# Patient Record
Sex: Male | Born: 1935 | Race: White | Hispanic: No | Marital: Married | Smoking: Former smoker
Health system: Southern US, Community
[De-identification: ages and names within clinical notes are randomized; demographics above are authoritative.]

## PROBLEM LIST (undated history)

## (undated) DIAGNOSIS — F329 Major depressive disorder, single episode, unspecified: Secondary | ICD-10-CM

## (undated) DIAGNOSIS — I5032 Chronic diastolic (congestive) heart failure: Secondary | ICD-10-CM

## (undated) DIAGNOSIS — I480 Paroxysmal atrial fibrillation: Secondary | ICD-10-CM

## (undated) DIAGNOSIS — M79609 Pain in unspecified limb: Secondary | ICD-10-CM

## (undated) DIAGNOSIS — R0600 Dyspnea, unspecified: Secondary | ICD-10-CM

## (undated) DIAGNOSIS — G9589 Other specified diseases of spinal cord: Secondary | ICD-10-CM

## (undated) DIAGNOSIS — Z87442 Personal history of urinary calculi: Secondary | ICD-10-CM

## (undated) DIAGNOSIS — R079 Chest pain, unspecified: Secondary | ICD-10-CM

## (undated) DIAGNOSIS — T884XXA Failed or difficult intubation, initial encounter: Secondary | ICD-10-CM

## (undated) DIAGNOSIS — M4800 Spinal stenosis, site unspecified: Secondary | ICD-10-CM

## (undated) DIAGNOSIS — S92909K Unspecified fracture of unspecified foot, subsequent encounter for fracture with nonunion: Secondary | ICD-10-CM

## (undated) DIAGNOSIS — R002 Palpitations: Secondary | ICD-10-CM

## (undated) DIAGNOSIS — M5030 Other cervical disc degeneration, unspecified cervical region: Secondary | ICD-10-CM

## (undated) DIAGNOSIS — Z86718 Personal history of other venous thrombosis and embolism: Secondary | ICD-10-CM

## (undated) DIAGNOSIS — M545 Low back pain: Secondary | ICD-10-CM

## (undated) DIAGNOSIS — I10 Essential (primary) hypertension: Secondary | ICD-10-CM

## (undated) DIAGNOSIS — F3289 Other specified depressive episodes: Secondary | ICD-10-CM

## (undated) DIAGNOSIS — E782 Mixed hyperlipidemia: Secondary | ICD-10-CM

## (undated) DIAGNOSIS — H6120 Impacted cerumen, unspecified ear: Secondary | ICD-10-CM

## (undated) DIAGNOSIS — M1990 Unspecified osteoarthritis, unspecified site: Secondary | ICD-10-CM

## (undated) DIAGNOSIS — R55 Syncope and collapse: Secondary | ICD-10-CM

## (undated) DIAGNOSIS — R1311 Dysphagia, oral phase: Secondary | ICD-10-CM

## (undated) DIAGNOSIS — K559 Vascular disorder of intestine, unspecified: Secondary | ICD-10-CM

## (undated) DIAGNOSIS — IMO0002 Reserved for concepts with insufficient information to code with codable children: Secondary | ICD-10-CM

## (undated) DIAGNOSIS — R0789 Other chest pain: Secondary | ICD-10-CM

## (undated) DIAGNOSIS — F419 Anxiety disorder, unspecified: Secondary | ICD-10-CM

## (undated) DIAGNOSIS — K8020 Calculus of gallbladder without cholecystitis without obstruction: Secondary | ICD-10-CM

## (undated) DIAGNOSIS — Z7901 Long term (current) use of anticoagulants: Secondary | ICD-10-CM

## (undated) DIAGNOSIS — C44529 Squamous cell carcinoma of skin of other part of trunk: Secondary | ICD-10-CM

## (undated) DIAGNOSIS — J189 Pneumonia, unspecified organism: Secondary | ICD-10-CM

## (undated) DIAGNOSIS — R609 Edema, unspecified: Secondary | ICD-10-CM

## (undated) DIAGNOSIS — R0902 Hypoxemia: Secondary | ICD-10-CM

## (undated) HISTORY — DX: Spinal stenosis, site unspecified: M48.00

## (undated) HISTORY — DX: Long term (current) use of anticoagulants: Z79.01

## (undated) HISTORY — DX: Mixed hyperlipidemia: E78.2

## (undated) HISTORY — DX: Other cervical disc degeneration, unspecified cervical region: M50.30

## (undated) HISTORY — DX: Personal history of other venous thrombosis and embolism: Z86.718

## (undated) HISTORY — DX: Major depressive disorder, single episode, unspecified: F32.9

## (undated) HISTORY — PX: CATARACT EXTRACTION W/ INTRAOCULAR LENS  IMPLANT, BILATERAL: SHX1307

## (undated) HISTORY — DX: Anxiety disorder, unspecified: F41.9

## (undated) HISTORY — DX: Impacted cerumen, unspecified ear: H61.20

## (undated) HISTORY — DX: Chronic diastolic (congestive) heart failure: I50.32

## (undated) HISTORY — DX: Low back pain: M54.5

## (undated) HISTORY — DX: Dysphagia, oral phase: R13.11

## (undated) HISTORY — DX: Vascular disorder of intestine, unspecified: K55.9

## (undated) HISTORY — DX: Hypoxemia: R09.02

## (undated) HISTORY — DX: Syncope and collapse: R55

## (undated) HISTORY — DX: Edema, unspecified: R60.9

## (undated) HISTORY — DX: Pneumonia, unspecified organism: J18.9

## (undated) HISTORY — DX: Squamous cell carcinoma of skin of other part of trunk: C44.529

## (undated) HISTORY — DX: Other specified depressive episodes: F32.89

## (undated) HISTORY — DX: Palpitations: R00.2

## (undated) HISTORY — DX: Chest pain, unspecified: R07.9

## (undated) HISTORY — DX: Other chest pain: R07.89

## (undated) HISTORY — DX: Pain in unspecified limb: M79.609

## (undated) HISTORY — PX: MULTIPLE TOOTH EXTRACTIONS: SHX2053

## (undated) HISTORY — DX: Other specified diseases of spinal cord: G95.89

## (undated) HISTORY — DX: Paroxysmal atrial fibrillation: I48.0

## (undated) HISTORY — DX: Reserved for concepts with insufficient information to code with codable children: IMO0002

## (undated) HISTORY — DX: Unspecified osteoarthritis, unspecified site: M19.90

---

## 1996-12-02 ENCOUNTER — Encounter: Payer: Self-pay | Admitting: Family Medicine

## 1998-11-11 ENCOUNTER — Encounter: Payer: Self-pay | Admitting: Family Medicine

## 2000-10-12 ENCOUNTER — Encounter: Payer: Self-pay | Admitting: Family Medicine

## 2000-10-12 HISTORY — PX: OTHER SURGICAL HISTORY: SHX169

## 2000-10-12 LAB — CONVERTED CEMR LAB: PSA: 0.6 ng/mL

## 2000-11-01 ENCOUNTER — Encounter: Admission: RE | Admit: 2000-11-01 | Discharge: 2000-11-01 | Payer: Self-pay | Admitting: Family Medicine

## 2000-11-01 ENCOUNTER — Encounter: Payer: Self-pay | Admitting: Family Medicine

## 2002-01-12 ENCOUNTER — Encounter: Payer: Self-pay | Admitting: Family Medicine

## 2002-01-12 LAB — CONVERTED CEMR LAB: PSA: 0.9 ng/mL

## 2003-03-06 DIAGNOSIS — J189 Pneumonia, unspecified organism: Secondary | ICD-10-CM

## 2003-03-06 HISTORY — DX: Pneumonia, unspecified organism: J18.9

## 2004-03-12 ENCOUNTER — Encounter: Payer: Self-pay | Admitting: Family Medicine

## 2004-03-12 LAB — CONVERTED CEMR LAB: PSA: 0.5 ng/mL

## 2004-11-01 ENCOUNTER — Ambulatory Visit: Payer: Self-pay | Admitting: Internal Medicine

## 2004-11-22 ENCOUNTER — Ambulatory Visit: Payer: Self-pay | Admitting: Cardiology

## 2004-12-09 ENCOUNTER — Ambulatory Visit: Payer: Self-pay | Admitting: *Deleted

## 2004-12-30 ENCOUNTER — Ambulatory Visit: Payer: Self-pay | Admitting: Cardiology

## 2005-01-31 ENCOUNTER — Ambulatory Visit: Payer: Self-pay | Admitting: Cardiology

## 2005-02-28 ENCOUNTER — Ambulatory Visit: Payer: Self-pay | Admitting: *Deleted

## 2005-03-28 ENCOUNTER — Ambulatory Visit: Payer: Self-pay | Admitting: *Deleted

## 2005-04-25 ENCOUNTER — Ambulatory Visit: Payer: Self-pay | Admitting: Cardiology

## 2005-05-12 ENCOUNTER — Ambulatory Visit: Payer: Self-pay | Admitting: Family Medicine

## 2005-05-12 LAB — CONVERTED CEMR LAB: PSA: 0.55 ng/mL

## 2005-05-17 ENCOUNTER — Ambulatory Visit: Payer: Self-pay | Admitting: Family Medicine

## 2005-05-23 ENCOUNTER — Ambulatory Visit: Payer: Self-pay | Admitting: Cardiology

## 2005-05-26 ENCOUNTER — Ambulatory Visit: Payer: Self-pay | Admitting: Family Medicine

## 2005-06-20 ENCOUNTER — Ambulatory Visit: Payer: Self-pay | Admitting: Cardiology

## 2005-07-19 ENCOUNTER — Ambulatory Visit: Payer: Self-pay | Admitting: Cardiology

## 2005-08-08 ENCOUNTER — Ambulatory Visit: Payer: Self-pay | Admitting: Cardiology

## 2005-09-05 ENCOUNTER — Ambulatory Visit: Payer: Self-pay | Admitting: Cardiology

## 2005-10-03 ENCOUNTER — Ambulatory Visit: Payer: Self-pay

## 2005-10-24 ENCOUNTER — Ambulatory Visit: Payer: Self-pay | Admitting: Cardiology

## 2005-11-16 ENCOUNTER — Ambulatory Visit: Payer: Self-pay | Admitting: Family Medicine

## 2005-11-21 ENCOUNTER — Ambulatory Visit: Payer: Self-pay | Admitting: Cardiology

## 2005-12-20 ENCOUNTER — Ambulatory Visit: Payer: Self-pay | Admitting: *Deleted

## 2006-01-16 ENCOUNTER — Ambulatory Visit: Payer: Self-pay | Admitting: Cardiology

## 2006-02-13 ENCOUNTER — Ambulatory Visit: Payer: Self-pay | Admitting: Cardiology

## 2006-03-13 ENCOUNTER — Ambulatory Visit: Payer: Self-pay | Admitting: Cardiology

## 2006-04-10 ENCOUNTER — Ambulatory Visit: Payer: Self-pay | Admitting: Cardiology

## 2006-05-09 ENCOUNTER — Ambulatory Visit: Payer: Self-pay | Admitting: Internal Medicine

## 2006-05-18 ENCOUNTER — Ambulatory Visit: Payer: Self-pay | Admitting: Family Medicine

## 2006-05-18 LAB — CONVERTED CEMR LAB: PSA: 0.52 ng/mL

## 2006-05-22 ENCOUNTER — Ambulatory Visit: Payer: Self-pay | Admitting: Family Medicine

## 2006-06-05 ENCOUNTER — Ambulatory Visit: Payer: Self-pay | Admitting: Cardiology

## 2006-06-07 ENCOUNTER — Ambulatory Visit: Payer: Self-pay | Admitting: Family Medicine

## 2006-07-03 ENCOUNTER — Ambulatory Visit: Payer: Self-pay | Admitting: Internal Medicine

## 2006-07-31 ENCOUNTER — Ambulatory Visit: Payer: Self-pay | Admitting: Cardiovascular Disease

## 2006-08-28 ENCOUNTER — Ambulatory Visit: Payer: Self-pay | Admitting: Cardiology

## 2006-09-25 ENCOUNTER — Ambulatory Visit: Payer: Self-pay | Admitting: Cardiology

## 2006-10-23 ENCOUNTER — Ambulatory Visit: Payer: Self-pay | Admitting: Cardiology

## 2006-11-07 ENCOUNTER — Ambulatory Visit: Payer: Self-pay | Admitting: Cardiology

## 2006-11-20 ENCOUNTER — Ambulatory Visit: Payer: Self-pay | Admitting: Family Medicine

## 2006-11-24 ENCOUNTER — Ambulatory Visit: Payer: Self-pay | Admitting: Cardiology

## 2006-12-25 ENCOUNTER — Ambulatory Visit: Payer: Self-pay | Admitting: Internal Medicine

## 2007-01-22 ENCOUNTER — Ambulatory Visit: Payer: Self-pay | Admitting: Cardiology

## 2007-02-05 ENCOUNTER — Ambulatory Visit: Payer: Self-pay | Admitting: Cardiovascular Disease

## 2007-03-05 ENCOUNTER — Ambulatory Visit: Payer: Self-pay | Admitting: Internal Medicine

## 2007-04-02 ENCOUNTER — Ambulatory Visit: Payer: Self-pay | Admitting: Cardiovascular Disease

## 2007-04-23 ENCOUNTER — Ambulatory Visit: Payer: Self-pay | Admitting: Cardiology

## 2007-05-21 ENCOUNTER — Ambulatory Visit: Payer: Self-pay | Admitting: Cardiovascular Disease

## 2007-05-25 ENCOUNTER — Ambulatory Visit: Payer: Self-pay | Admitting: Family Medicine

## 2007-05-25 LAB — CONVERTED CEMR LAB
ALT: 38 units/L (ref 0–40)
AST: 38 units/L — ABNORMAL HIGH (ref 0–37)
Albumin: 3.7 g/dL (ref 3.5–5.2)
Alkaline Phosphatase: 35 units/L — ABNORMAL LOW (ref 39–117)
BUN: 15 mg/dL (ref 6–23)
Bilirubin, Direct: 0.1 mg/dL (ref 0.0–0.3)
CO2: 29 meq/L (ref 19–32)
Calcium: 9.1 mg/dL (ref 8.4–10.5)
Chloride: 107 meq/L (ref 96–112)
Cholesterol: 156 mg/dL (ref 0–200)
Creatinine, Ser: 1 mg/dL (ref 0.4–1.5)
Direct LDL: 50.3 mg/dL
GFR calc Af Amer: 94 mL/min
GFR calc non Af Amer: 78 mL/min
Glucose, Bld: 91 mg/dL (ref 70–99)
HDL: 28.4 mg/dL — ABNORMAL LOW (ref >39.0)
PSA: 0.55 ng/mL (ref 0.10–4.00)
Potassium: 3.9 meq/L (ref 3.5–5.1)
Sodium: 141 meq/L (ref 135–145)
TSH: 1.85 microintl units/mL (ref 0.35–5.50)
Total Bilirubin: 0.8 mg/dL (ref 0.3–1.2)
Total CHOL/HDL Ratio: 5.5
Total Protein: 6.7 g/dL (ref 6.0–8.3)
Triglycerides: 293 mg/dL (ref 0–149)
VLDL: 59 mg/dL — ABNORMAL HIGH (ref 0–40)

## 2007-05-28 ENCOUNTER — Encounter: Payer: Self-pay | Admitting: Family Medicine

## 2007-05-28 DIAGNOSIS — K7689 Other specified diseases of liver: Secondary | ICD-10-CM

## 2007-05-28 DIAGNOSIS — E785 Hyperlipidemia, unspecified: Secondary | ICD-10-CM | POA: Insufficient documentation

## 2007-05-28 DIAGNOSIS — F529 Unspecified sexual dysfunction not due to a substance or known physiological condition: Secondary | ICD-10-CM | POA: Insufficient documentation

## 2007-05-29 ENCOUNTER — Ambulatory Visit: Payer: Self-pay | Admitting: Family Medicine

## 2007-06-19 ENCOUNTER — Ambulatory Visit: Payer: Self-pay | Admitting: Cardiology

## 2007-07-06 ENCOUNTER — Ambulatory Visit: Payer: Self-pay | Admitting: Family Medicine

## 2007-07-09 ENCOUNTER — Encounter (INDEPENDENT_AMBULATORY_CARE_PROVIDER_SITE_OTHER): Payer: Self-pay | Admitting: *Deleted

## 2007-07-16 ENCOUNTER — Ambulatory Visit: Payer: Self-pay | Admitting: Cardiology

## 2007-08-06 ENCOUNTER — Ambulatory Visit: Payer: Self-pay | Admitting: Cardiology

## 2007-09-03 ENCOUNTER — Encounter: Payer: Self-pay | Admitting: Family Medicine

## 2007-09-03 ENCOUNTER — Ambulatory Visit: Payer: Self-pay | Admitting: Cardiovascular Disease

## 2007-09-17 ENCOUNTER — Ambulatory Visit: Payer: Self-pay | Admitting: Internal Medicine

## 2007-10-15 ENCOUNTER — Ambulatory Visit: Payer: Self-pay | Admitting: Cardiovascular Disease

## 2007-11-12 ENCOUNTER — Ambulatory Visit: Payer: Self-pay | Admitting: Cardiology

## 2007-12-03 ENCOUNTER — Ambulatory Visit: Payer: Self-pay | Admitting: Cardiology

## 2007-12-10 ENCOUNTER — Ambulatory Visit: Payer: Self-pay | Admitting: Family Medicine

## 2007-12-10 DIAGNOSIS — N529 Male erectile dysfunction, unspecified: Secondary | ICD-10-CM

## 2007-12-31 ENCOUNTER — Ambulatory Visit: Payer: Self-pay | Admitting: Cardiovascular Disease

## 2008-01-15 ENCOUNTER — Telehealth (INDEPENDENT_AMBULATORY_CARE_PROVIDER_SITE_OTHER): Payer: Self-pay | Admitting: *Deleted

## 2008-01-28 ENCOUNTER — Ambulatory Visit: Payer: Self-pay | Admitting: Internal Medicine

## 2008-02-25 ENCOUNTER — Ambulatory Visit: Payer: Self-pay | Admitting: Internal Medicine

## 2008-03-06 ENCOUNTER — Telehealth: Payer: Self-pay | Admitting: Family Medicine

## 2008-03-17 ENCOUNTER — Ambulatory Visit: Payer: Self-pay | Admitting: Internal Medicine

## 2008-04-14 ENCOUNTER — Ambulatory Visit: Payer: Self-pay | Admitting: Cardiovascular Disease

## 2008-05-12 ENCOUNTER — Ambulatory Visit: Payer: Self-pay | Admitting: Cardiology

## 2008-06-02 ENCOUNTER — Ambulatory Visit: Payer: Self-pay | Admitting: Family Medicine

## 2008-06-02 LAB — CONVERTED CEMR LAB
Albumin: 4 g/dL (ref 3.5–5.2)
BUN: 22 mg/dL (ref 6–23)
Calcium: 8.9 mg/dL (ref 8.4–10.5)
Cholesterol: 158 mg/dL (ref 0–200)
Creatinine, Ser: 1 mg/dL (ref 0.4–1.5)
Eosinophils Relative: 3.3 % (ref 0.0–5.0)
GFR calc Af Amer: 94 mL/min
Glucose, Bld: 98 mg/dL (ref 70–99)
HCT: 42.5 % (ref 39.0–52.0)
Hemoglobin: 14.6 g/dL (ref 13.0–17.0)
Monocytes Absolute: 0.4 10*3/uL (ref 0.1–1.0)
Monocytes Relative: 7.8 % (ref 3.0–12.0)
Neutro Abs: 2.2 10*3/uL (ref 1.4–7.7)
PSA: 0.46 ng/mL (ref 0.10–4.00)
RDW: 12.2 % (ref 11.5–14.6)
Sodium: 143 meq/L (ref 135–145)
TSH: 1.47 microintl units/mL (ref 0.35–5.50)
Total CHOL/HDL Ratio: 4.7
Total Protein: 7.1 g/dL (ref 6.0–8.3)

## 2008-06-05 ENCOUNTER — Ambulatory Visit: Payer: Self-pay | Admitting: Family Medicine

## 2008-06-10 ENCOUNTER — Ambulatory Visit: Payer: Self-pay | Admitting: Internal Medicine

## 2008-07-07 ENCOUNTER — Ambulatory Visit: Payer: Self-pay | Admitting: Cardiology

## 2008-07-11 ENCOUNTER — Ambulatory Visit: Payer: Self-pay | Admitting: Family Medicine

## 2008-07-11 LAB — FECAL OCCULT BLOOD, GUAIAC: Fecal Occult Blood: NEGATIVE

## 2008-07-14 ENCOUNTER — Encounter (INDEPENDENT_AMBULATORY_CARE_PROVIDER_SITE_OTHER): Payer: Self-pay | Admitting: *Deleted

## 2008-07-21 ENCOUNTER — Ambulatory Visit: Payer: Self-pay | Admitting: Cardiology

## 2008-08-19 ENCOUNTER — Ambulatory Visit: Payer: Self-pay | Admitting: Cardiology

## 2008-09-08 ENCOUNTER — Ambulatory Visit: Payer: Self-pay | Admitting: Internal Medicine

## 2008-09-30 ENCOUNTER — Ambulatory Visit: Payer: Self-pay | Admitting: Internal Medicine

## 2008-10-20 ENCOUNTER — Ambulatory Visit: Payer: Self-pay | Admitting: Cardiovascular Disease

## 2008-11-03 ENCOUNTER — Ambulatory Visit: Payer: Self-pay | Admitting: Cardiology

## 2008-11-24 ENCOUNTER — Ambulatory Visit: Payer: Self-pay | Admitting: Internal Medicine

## 2008-12-01 ENCOUNTER — Ambulatory Visit: Payer: Self-pay | Admitting: Family Medicine

## 2008-12-01 DIAGNOSIS — M66329 Spontaneous rupture of flexor tendons, unspecified upper arm: Secondary | ICD-10-CM | POA: Insufficient documentation

## 2008-12-09 ENCOUNTER — Encounter: Payer: Self-pay | Admitting: Family Medicine

## 2008-12-16 ENCOUNTER — Ambulatory Visit: Payer: Self-pay | Admitting: Cardiology

## 2009-01-06 ENCOUNTER — Ambulatory Visit: Payer: Self-pay | Admitting: Cardiology

## 2009-01-27 ENCOUNTER — Ambulatory Visit: Payer: Self-pay | Admitting: Cardiology

## 2009-02-24 ENCOUNTER — Ambulatory Visit: Payer: Self-pay | Admitting: Cardiology

## 2009-03-12 ENCOUNTER — Ambulatory Visit: Payer: Self-pay | Admitting: Internal Medicine

## 2009-03-17 ENCOUNTER — Ambulatory Visit: Payer: Self-pay | Admitting: Family Medicine

## 2009-03-17 DIAGNOSIS — M545 Low back pain, unspecified: Secondary | ICD-10-CM | POA: Insufficient documentation

## 2009-03-17 HISTORY — DX: Low back pain, unspecified: M54.50

## 2009-03-23 ENCOUNTER — Ambulatory Visit: Payer: Self-pay | Admitting: Internal Medicine

## 2009-04-06 ENCOUNTER — Ambulatory Visit: Payer: Self-pay | Admitting: Internal Medicine

## 2009-04-24 ENCOUNTER — Ambulatory Visit: Payer: Self-pay | Admitting: Cardiology

## 2009-05-08 ENCOUNTER — Encounter: Admission: RE | Admit: 2009-05-08 | Discharge: 2009-05-08 | Payer: Self-pay | Admitting: Internal Medicine

## 2009-05-12 ENCOUNTER — Encounter: Payer: Self-pay | Admitting: *Deleted

## 2009-05-22 ENCOUNTER — Ambulatory Visit: Payer: Self-pay | Admitting: Cardiology

## 2009-05-22 LAB — CONVERTED CEMR LAB
POC INR: 2.6
Protime: 19.6

## 2009-06-17 ENCOUNTER — Encounter: Payer: Self-pay | Admitting: *Deleted

## 2009-06-19 ENCOUNTER — Ambulatory Visit: Payer: Self-pay

## 2009-06-19 ENCOUNTER — Encounter (INDEPENDENT_AMBULATORY_CARE_PROVIDER_SITE_OTHER): Payer: Self-pay | Admitting: Cardiology

## 2009-06-19 LAB — CONVERTED CEMR LAB
POC INR: 5.3
Prothrombin Time: 51.7 s — ABNORMAL HIGH (ref 10.9–13.3)

## 2009-06-25 ENCOUNTER — Ambulatory Visit: Payer: Self-pay | Admitting: Cardiology

## 2009-07-09 ENCOUNTER — Ambulatory Visit: Payer: Self-pay | Admitting: Family Medicine

## 2009-07-09 LAB — CONVERTED CEMR LAB
Albumin: 4.1 g/dL (ref 3.5–5.2)
Alkaline Phosphatase: 35 units/L — ABNORMAL LOW (ref 39–117)
BUN: 18 mg/dL (ref 6–23)
Basophils Absolute: 0 10*3/uL (ref 0.0–0.1)
Basophils Relative: 0.8 % (ref 0.0–3.0)
Bilirubin, Direct: 0.1 mg/dL (ref 0.0–0.3)
CO2: 29 meq/L (ref 19–32)
Chloride: 106 meq/L (ref 96–112)
Cholesterol: 190 mg/dL (ref 0–200)
Creatinine, Ser: 1 mg/dL (ref 0.4–1.5)
Glucose, Bld: 85 mg/dL (ref 70–99)
HCT: 40.1 % (ref 39.0–52.0)
HDL: 42.6 mg/dL (ref >39.00)
Hemoglobin: 13.6 g/dL (ref 13.0–17.0)
Lymphs Abs: 1.8 10*3/uL (ref 0.7–4.0)
Monocytes Relative: 7.3 % (ref 3.0–12.0)
Neutro Abs: 2.6 10*3/uL (ref 1.4–7.7)
PSA: 0.54 ng/mL (ref 0.10–4.00)
Potassium: 4.6 meq/L (ref 3.5–5.1)
RBC: 4.09 M/uL — ABNORMAL LOW (ref 4.22–5.81)
RDW: 12.2 % (ref 11.5–14.6)
Total CHOL/HDL Ratio: 4
VLDL: 47.6 mg/dL — ABNORMAL HIGH (ref 0.0–40.0)

## 2009-07-13 ENCOUNTER — Ambulatory Visit: Payer: Self-pay | Admitting: Internal Medicine

## 2009-07-13 ENCOUNTER — Encounter (INDEPENDENT_AMBULATORY_CARE_PROVIDER_SITE_OTHER): Payer: Self-pay | Admitting: Cardiology

## 2009-07-13 LAB — CONVERTED CEMR LAB: POC INR: 1.4

## 2009-07-16 ENCOUNTER — Ambulatory Visit: Payer: Self-pay | Admitting: Family Medicine

## 2009-07-16 DIAGNOSIS — F419 Anxiety disorder, unspecified: Secondary | ICD-10-CM

## 2009-07-24 ENCOUNTER — Ambulatory Visit: Payer: Self-pay | Admitting: Cardiology

## 2009-08-07 ENCOUNTER — Ambulatory Visit: Payer: Self-pay | Admitting: Internal Medicine

## 2009-08-07 LAB — CONVERTED CEMR LAB: Prothrombin Time: 15.3 s

## 2009-08-19 ENCOUNTER — Ambulatory Visit: Payer: Self-pay | Admitting: Family Medicine

## 2009-08-20 ENCOUNTER — Ambulatory Visit: Payer: Self-pay | Admitting: Internal Medicine

## 2009-08-20 LAB — CONVERTED CEMR LAB: POC INR: 2.5

## 2009-09-10 ENCOUNTER — Ambulatory Visit: Payer: Self-pay | Admitting: Cardiology

## 2009-09-10 LAB — CONVERTED CEMR LAB: POC INR: 3

## 2009-09-11 ENCOUNTER — Telehealth: Payer: Self-pay | Admitting: Family Medicine

## 2009-10-12 ENCOUNTER — Ambulatory Visit: Payer: Self-pay | Admitting: Internal Medicine

## 2009-10-12 LAB — CONVERTED CEMR LAB: POC INR: 4

## 2009-10-20 ENCOUNTER — Ambulatory Visit: Payer: Self-pay | Admitting: Cardiovascular Disease

## 2009-10-20 LAB — CONVERTED CEMR LAB: POC INR: 1.8

## 2009-11-10 ENCOUNTER — Ambulatory Visit: Payer: Self-pay | Admitting: Cardiology

## 2009-11-10 LAB — CONVERTED CEMR LAB: POC INR: 1.3

## 2009-11-17 ENCOUNTER — Ambulatory Visit: Payer: Self-pay | Admitting: Cardiology

## 2009-12-07 ENCOUNTER — Ambulatory Visit: Payer: Self-pay | Admitting: Cardiology

## 2009-12-07 ENCOUNTER — Encounter: Payer: Self-pay | Admitting: Cardiology

## 2009-12-18 ENCOUNTER — Ambulatory Visit: Payer: Self-pay | Admitting: Cardiology

## 2009-12-28 ENCOUNTER — Ambulatory Visit: Payer: Self-pay | Admitting: Internal Medicine

## 2009-12-28 ENCOUNTER — Encounter: Payer: Self-pay | Admitting: Cardiology

## 2009-12-30 LAB — CONVERTED CEMR LAB
INR: 1.49 (ref <1.50)
Prothrombin Time: 17.9 s — ABNORMAL HIGH (ref 11.6–15.2)

## 2010-01-11 ENCOUNTER — Ambulatory Visit: Payer: Self-pay | Admitting: Internal Medicine

## 2010-01-11 LAB — CONVERTED CEMR LAB: POC INR: 4

## 2010-01-19 ENCOUNTER — Ambulatory Visit: Payer: Self-pay | Admitting: Cardiovascular Disease

## 2010-01-19 LAB — CONVERTED CEMR LAB: POC INR: 3.1

## 2010-02-02 ENCOUNTER — Ambulatory Visit: Payer: Self-pay | Admitting: Cardiovascular Disease

## 2010-02-02 LAB — CONVERTED CEMR LAB: INR: 3.6

## 2010-02-16 ENCOUNTER — Ambulatory Visit: Payer: Self-pay | Admitting: Cardiovascular Disease

## 2010-02-17 ENCOUNTER — Ambulatory Visit: Payer: Self-pay | Admitting: Family Medicine

## 2010-03-15 ENCOUNTER — Ambulatory Visit: Payer: Self-pay | Admitting: Cardiovascular Disease

## 2010-03-15 LAB — CONVERTED CEMR LAB: POC INR: 2.5

## 2010-04-12 ENCOUNTER — Ambulatory Visit: Payer: Self-pay | Admitting: Cardiovascular Disease

## 2010-05-11 ENCOUNTER — Ambulatory Visit: Payer: Self-pay | Admitting: Cardiovascular Disease

## 2010-05-18 ENCOUNTER — Ambulatory Visit: Payer: Self-pay | Admitting: Internal Medicine

## 2010-06-01 ENCOUNTER — Ambulatory Visit: Payer: Self-pay | Admitting: Cardiovascular Disease

## 2010-06-01 LAB — CONVERTED CEMR LAB: POC INR: 2.9

## 2010-06-22 ENCOUNTER — Ambulatory Visit: Payer: Self-pay | Admitting: Cardiovascular Disease

## 2010-07-20 ENCOUNTER — Encounter (INDEPENDENT_AMBULATORY_CARE_PROVIDER_SITE_OTHER): Payer: Self-pay | Admitting: *Deleted

## 2010-07-21 ENCOUNTER — Ambulatory Visit: Payer: Self-pay | Admitting: Cardiovascular Disease

## 2010-08-10 ENCOUNTER — Telehealth: Payer: Self-pay | Admitting: Family Medicine

## 2010-08-11 ENCOUNTER — Ambulatory Visit: Payer: Self-pay | Admitting: Cardiology

## 2010-08-11 LAB — CONVERTED CEMR LAB: POC INR: 2.4

## 2010-09-08 ENCOUNTER — Ambulatory Visit: Payer: Self-pay | Admitting: Cardiology

## 2010-09-08 LAB — CONVERTED CEMR LAB: POC INR: 2.5

## 2010-10-06 ENCOUNTER — Ambulatory Visit: Payer: Self-pay | Admitting: Cardiovascular Disease

## 2010-10-06 LAB — CONVERTED CEMR LAB: POC INR: 3.5

## 2010-10-07 ENCOUNTER — Encounter: Payer: Self-pay | Admitting: Family Medicine

## 2010-10-27 ENCOUNTER — Ambulatory Visit: Payer: Self-pay | Admitting: Cardiovascular Disease

## 2010-11-17 ENCOUNTER — Ambulatory Visit: Payer: Self-pay | Admitting: Cardiovascular Disease

## 2010-11-17 LAB — CONVERTED CEMR LAB: POC INR: 2.5

## 2010-11-29 ENCOUNTER — Encounter: Payer: Self-pay | Admitting: Family Medicine

## 2010-11-29 ENCOUNTER — Ambulatory Visit: Payer: Self-pay | Admitting: Family Medicine

## 2010-11-29 DIAGNOSIS — E559 Vitamin D deficiency, unspecified: Secondary | ICD-10-CM | POA: Insufficient documentation

## 2010-11-30 LAB — CONVERTED CEMR LAB
ALT: 37 units/L (ref 0–53)
Alkaline Phosphatase: 36 units/L — ABNORMAL LOW (ref 39–117)
BUN: 16 mg/dL (ref 6–23)
Basophils Relative: 0.8 % (ref 0.0–3.0)
Bilirubin, Direct: 0.1 mg/dL (ref 0.0–0.3)
CO2: 28 meq/L (ref 19–32)
Eosinophils Relative: 3.2 % (ref 0.0–5.0)
GFR calc non Af Amer: 72.88 mL/min (ref >60.00)
Glucose, Bld: 87 mg/dL (ref 70–99)
HCT: 44.3 % (ref 39.0–52.0)
HDL: 36.7 mg/dL — ABNORMAL LOW (ref >39.00)
Hemoglobin: 15.5 g/dL (ref 13.0–17.0)
Lymphocytes Relative: 40.9 % (ref 12.0–46.0)
MCHC: 35 g/dL (ref 30.0–36.0)
MCV: 96.2 fL (ref 78.0–100.0)
Monocytes Relative: 7.9 % (ref 3.0–12.0)
Platelets: 219 10*3/uL (ref 150.0–400.0)
Potassium: 4.3 meq/L (ref 3.5–5.1)
RDW: 13 % (ref 11.5–14.6)
Sodium: 142 meq/L (ref 135–145)
Total Protein: 7.1 g/dL (ref 6.0–8.3)
Triglycerides: 291 mg/dL — ABNORMAL HIGH (ref 0.0–149.0)

## 2010-12-01 ENCOUNTER — Ambulatory Visit: Payer: Self-pay | Admitting: Family Medicine

## 2010-12-02 ENCOUNTER — Encounter
Admission: RE | Admit: 2010-12-02 | Discharge: 2010-12-02 | Payer: Self-pay | Source: Home / Self Care | Attending: Physical Medicine and Rehabilitation | Admitting: Physical Medicine and Rehabilitation

## 2010-12-15 ENCOUNTER — Ambulatory Visit: Admission: RE | Admit: 2010-12-15 | Discharge: 2010-12-15 | Payer: Self-pay | Source: Home / Self Care

## 2010-12-24 ENCOUNTER — Encounter: Payer: Self-pay | Admitting: Family Medicine

## 2010-12-30 ENCOUNTER — Telehealth: Payer: Self-pay | Admitting: Family Medicine

## 2010-12-30 ENCOUNTER — Encounter: Payer: Self-pay | Admitting: Family Medicine

## 2011-01-06 ENCOUNTER — Ambulatory Visit
Admission: RE | Admit: 2011-01-06 | Discharge: 2011-01-06 | Payer: Self-pay | Source: Home / Self Care | Attending: Family Medicine | Admitting: Family Medicine

## 2011-01-13 NOTE — Medication Information (Signed)
Summary: CCR/NE  Anticoagulant Therapy  Managed by: Cloyde Reams, RN, BSN Referring MD: Dr. Laurita Quint Supervising MD: Mariah Milling Indication 1: Atrial Fibrillation (ICD-427.31) Lab Used: [facility] [place] Site: [place] INR POC 1.8 INR RANGE 2 - 3  Dietary changes: no    Health status changes: no    Bleeding/hemorrhagic complications: no    Recent/future hospitalizations: no    Any changes in medication regimen? no    Recent/future dental: no  Any missed doses?: no       Is patient compliant with meds? yes       Allergies: No Known Drug Allergies  Anticoagulation Management History:      The patient is taking warfarin and comes in today for a routine follow up visit.  Positive risk factors for bleeding include an age of 75 years or older.  The bleeding index is 'intermediate risk'.  Positive CHADS2 values include History of HTN and Age > 75 years old.  The start date was 04/18/2003.  His last INR was 3.6.  Anticoagulation responsible Takesha Steger: gollan.  INR POC: 1.8.  Cuvette Lot#: [id number].  Exp: 09/2011.    Anticoagulation Management Assessment/Plan:      The patient's current anticoagulation dose is Warfarin sodium 5 mg tabs: Take 11/2 by mo uth daily as directed.  The target INR is 2 - 3.  The next INR is due 08/11/2010.  Anticoagulation instructions were given to patient.  Results were reviewed/authorized by Cloyde Reams, RN, BSN.  He was notified by Cloyde Reams RN.         Prior Anticoagulation Instructions: INR 2.4  Continue taking 1.5 tabs daily except 2 tabs on Monday and Friday. Recheck in 4 weeks.   Current Anticoagulation Instructions: INR 1.8  Start taking 1.5 tablets daily except 2 tablets on Mondays, Wednesdays, and Fridays.  Recheck in 3 weeks.

## 2011-01-13 NOTE — Progress Notes (Signed)
Summary: Rx Warfarin & Metoprolol  Phone Note Refill Request Call back at fax [phone] Message from:  [facility] on August 10, 2010 3:09 PM  Refills Requested: Medication #1:  WARFARIN SODIUM 5 MG TABS Take 11/2 by mo uth daily as directed   Supply Requested: 3 months  Medication #2:  METOPROLOL TARTRATE 100 MG  TABS 1 TABLET TWICE A DAY BY MOUTH   Supply Requested: 3 months Received faxed refill request, patient requesting a 90-day supply.  Form in your IN box.   Method Requested: Fax to Mail Away Pharmacy Initial call taken by: Linde Gillis CMA Duncan Dull),  August 10, 2010 3:10 PM  Follow-up for Phone Call        signed.  Follow-up by: Crawford Givens MD,  August 11, 2010 12:37 PM  Additional Follow-up for Phone Call Additional follow up Details #1::        Forms faxed. Additional Follow-up by: Lowella Petties CMA,  August 11, 2010 3:02 PM

## 2011-01-13 NOTE — Medication Information (Signed)
Summary: CCR/AMD   Anticoagulant Therapy  Managed by: Robyn Haber, RN, BSN Referring MD: Dr. Laurita Quint Supervising MD: Mariah Milling Indication 1: Atrial Fibrillation (ICD-427.31) Lab Used: [facility] [name] Site: [place] INR RANGE 2 - 3  Dietary changes: no    Health status changes: no    Bleeding/hemorrhagic complications: no    Recent/future hospitalizations: no    Any changes in medication regimen? no    Recent/future dental: no  Any missed doses?: no       Is patient compliant with meds? yes       Allergies: No Known Drug Allergies  Anticoagulation Management History:      His anticoagulation is being managed by telephone today.  Positive risk factors for bleeding include an age of 75 years or older.  The bleeding index is 'intermediate risk'.  Positive CHADS2 values include History of HTN and Age > 108 years old.  The start date was 04/18/2003.  His last INR was 3.6.  Anticoagulation responsible provider: Meeka Cartelli.  Exp: 09/2010.    Anticoagulation Management Assessment/Plan:      The patient's current anticoagulation dose is Warfarin sodium 5 mg tabs: Take 11/2 by mo uth daily as directed.  The target INR is 2 - 3.  The next INR is due 03/16/2010.  Anticoagulation instructions were given to patient.  Results were reviewed/authorized by Robyn Haber, RN, BSN.  He was notified by Charlena Cross, RN, BSN.         Prior Anticoagulation Instructions: coumadin 7.5 mg on Tues and Thurs, 10 mg all other days  Current Anticoagulation Instructions: coumadin 10 mg daily with 7.5 mg on T Th Sa

## 2011-01-13 NOTE — Medication Information (Signed)
Summary: CCR/AMD  Anticoagulant Therapy  Managed by: Cloyde Reams, RN, BSN Referring MD: Dr. Laurita Quint Supervising MD: Mariah Milling Indication 1: Atrial Fibrillation (ICD-427.31) Lab Used: [facility] [place] Site: [place] INR RANGE 2 - 3    Bleeding/hemorrhagic complications: no     Any changes in medication regimen? yes       Details: Started on antifungal medication 05/03/10 x 12 week course.  Also started on an arthritis medication as well.  Pt will bring medication bottle to next OV.     Any missed doses?: no       Is patient compliant with meds? yes       Allergies: No Known Drug Allergies  Anticoagulation Management History:      The patient is taking warfarin and comes in today for a routine follow up visit.  Positive risk factors for bleeding include an age of 75 years or older.  The bleeding index is 'intermediate risk'.  Positive CHADS2 values include History of HTN and Age > 70 years old.  The start date was 04/18/2003.  His last INR was 3.6.  Anticoagulation responsible provider: Kentrail Shew.  Exp: 04/2011.    Anticoagulation Management Assessment/Plan:      The patient's current anticoagulation dose is Warfarin sodium 5 mg tabs: Take 11/2 by mo uth daily as directed.  The target INR is 2 - 3.  The next INR is due 05/21/2010.  Anticoagulation instructions were given to patient.  Results were reviewed/authorized by Cloyde Reams, RN, BSN.  He was notified by Cloyde Reams RN.         Prior Anticoagulation Instructions: INR 2.4  Continue on same dosage 10mg  daily except 7.5mg  on Tuesdays, Thursdays, and Saturdays.  Recheck in 4 weeks.    Current Anticoagulation Instructions: INR 3.4  Skip tomorrow's dosage of coumadin, then start taking 7.5mg  daily except 10mg  on Mondays and Fridays.  Recheck in 1 week-10 days.

## 2011-01-13 NOTE — Medication Information (Signed)
Summary: CCR/AMD   Anticoagulant Therapy  Managed by: Robyn Haber, RN, BSN Referring MD: Dr. Laurita Quint Supervising MD: Mariah Milling Indication 1: Atrial Fibrillation (ICD-427.31) Lab Used: [facility] Site: [place] INR POC 3.1 INR RANGE 2 - 3           Allergies: No Known Drug Allergies  Anticoagulation Management History:      The patient is taking warfarin and comes in today for a routine follow up visit.  Positive risk factors for bleeding include an age of 30 years or older.  The bleeding index is 'intermediate risk'.  Positive CHADS2 values include History of HTN and Age > 75 years old.  The start date was 04/18/2003.  His last INR was 1.49.  Anticoagulation responsible provider: Gollan.  INR POC: 3.1.  Exp: 09/2010.    Anticoagulation Management Assessment/Plan:      The patient's current anticoagulation dose is Warfarin sodium 5 mg tabs: Take 11/2 by mo uth daily as directed.  The target INR is 2 - 3.  The next INR is due 02/02/2010.  Anticoagulation instructions were given to patient.  Results were reviewed/authorized by Robyn Haber, RN, BSN.  He was notified by Charlena Cross, RN, BSN.         Prior Anticoagulation Instructions: no coumadin today, coumadin 5 mg tomorrow, then coumadin 10 mg daily  Current Anticoagulation Instructions: coumadin 7.5 mg on Tues, coumadin 10 mg all other days.

## 2011-01-13 NOTE — Medication Information (Signed)
Summary: rov/tm  Anticoagulant Therapy  Managed by: Bethena Midget, RN, BSN Referring MD: Dr. Laurita Quint Supervising MD: Mariah Milling Indication 1: Atrial Fibrillation (ICD-427.31) Lab Used: [facility] [place] Site: [place] INR POC 2.5 INR RANGE 2 - 3  Dietary changes: no    Health status changes: no    Bleeding/hemorrhagic complications: no    Recent/future hospitalizations: no    Any changes in medication regimen? no    Recent/future dental: no  Any missed doses?: no       Is patient compliant with meds? yes       Allergies: No Known Drug Allergies  Anticoagulation Management History:      The patient is taking warfarin and comes in today for a routine follow up visit.  Positive risk factors for bleeding include an age of 75 years or older.  The bleeding index is 'intermediate risk'.  Positive CHADS2 values include History of HTN and Age > 24 years old.  The start date was 04/18/2003.  His last INR was 3.6.  Anticoagulation responsible provider: gollan.  INR POC: 2.5.  Cuvette Lot#: [id number].  Exp: 11/2011.    Anticoagulation Management Assessment/Plan:      The patient's current anticoagulation dose is Warfarin sodium 5 mg tabs: Take 11/2 by mo uth daily as directed.  The target INR is 2 - 3.  The next INR is due 12/15/2010.  Anticoagulation instructions were given to patient.  Results were reviewed/authorized by Bethena Midget, RN, BSN.  He was notified by Bethena Midget, RN, BSN.         Prior Anticoagulation Instructions: INR 3.0 Tomorrow take only 1 pill then resume 1.5 pill everyday except 2 pills on Mondays, Wednesdays and Fridays. Eat extra green  leafy veggies each week. Recheck in 3 weeks.   Current Anticoagulation Instructions: INR 2.5 Continue 7.5mg s daily except 10mg s on Mondays, Wednesdays and Fridays. REcheck in 4 weeks.

## 2011-01-13 NOTE — Medication Information (Signed)
Summary: rov/ewj  Anticoagulant Therapy  Managed by: Bethena Midget, RN, BSN Referring MD: Dr. Laurita Quint Supervising MD: Mariah Milling Indication 1: Atrial Fibrillation (ICD-427.31) Lab Used: [facility] [place] Site: [place] INR POC 3.0 INR RANGE 2 - 3  Dietary changes: yes       Details: Eating less greens.   Health status changes: no    Bleeding/hemorrhagic complications: no    Recent/future hospitalizations: no    Any changes in medication regimen? no    Recent/future dental: no  Any missed doses?: no       Is patient compliant with meds? yes       Allergies: No Known Drug Allergies  Anticoagulation Management History:      The patient is taking warfarin and comes in today for a routine follow up visit.  Positive risk factors for bleeding include an age of 75 years or older.  The bleeding index is 'intermediate risk'.  Positive CHADS2 values include History of HTN and Age > 75 years old.  The start date was 04/18/2003.  His last INR was 3.6.  Anticoagulation responsible provider: gollan.  INR POC: 3.0.  Cuvette Lot#: [id number].  Exp: 11/2011.    Anticoagulation Management Assessment/Plan:      The patient's current anticoagulation dose is Warfarin sodium 5 mg tabs: Take 11/2 by mo uth daily as directed.  The target INR is 2 - 3.  The next INR is due 11/17/2010.  Anticoagulation instructions were given to patient.  Results were reviewed/authorized by Bethena Midget, RN, BSN.  He was notified by Bethena Midget, RN, BSN.         Prior Anticoagulation Instructions: INR 3.5  Skip today's dosage of Coumadin, then resume same dosage 1.5 tablets daily except 2 tablets on Mondays, Wednesdays, and Fridays.  Recheck in 3 weeks.    Current Anticoagulation Instructions: INR 3.0 Tomorrow take only 1 pill then resume 1.5 pill everyday except 2 pills on Mondays, Wednesdays and Fridays. Eat extra green  leafy veggies each week. Recheck in 3 weeks.

## 2011-01-13 NOTE — Medication Information (Signed)
Summary: CCR/AMD   Anticoagulant Therapy  Managed by: Robyn Haber, RN, BSN Referring MD: Dr. Laurita Quint Supervising MD: Shirlee Latch MD, Freida Busman Indication 1: Atrial Fibrillation (ICD-427.31) Lab Used: [facility] Site: [place] PT 17.9 INR RANGE 2 - 3  Dietary changes: no    Health status changes: no    Bleeding/hemorrhagic complications: no    Recent/future hospitalizations: no    Any changes in medication regimen? no    Recent/future dental: no  Any missed doses?: no       Is patient compliant with meds? yes       Allergies: No Known Drug Allergies  Anticoagulation Management History:      His anticoagulation is being managed by telephone today.  Positive risk factors for bleeding include an age of 75 years or older.  The bleeding index is 'intermediate risk'.  Positive CHADS2 values include History of HTN and Age > 74 years old.  The start date was 04/18/2003.  His last INR was 5.3 ratio and today's INR is 1.49.  Prothrombin time is 17.9.  Anticoagulation responsible provider: Shirlee Latch MD, [place].  Exp: 09/2010.    Anticoagulation Management Assessment/Plan:      The patient's current anticoagulation dose is Warfarin sodium 5 mg tabs: Take 11/2 by mo uth daily as directed.  The target INR is 2 - 3.  The next INR is due 01/11/2010.  Anticoagulation instructions were given to patient.  Results were reviewed/authorized by Robyn Haber, RN, BSN.  He was notified by Charlena Cross, RN, BSN.         Prior Anticoagulation Instructions: coumadin 10 mg daily with 12.5 mg on Friday

## 2011-01-13 NOTE — Medication Information (Signed)
Summary: CCR   Anticoagulant Therapy  Managed by: Robyn Haber, RN, BSN Referring MD: Dr. Laurita Quint Supervising MD: Graciela Husbands MD, Viviann Spare Indication 1: Atrial Fibrillation (ICD-427.31) Lab Used: [facility] Site: [place] INR POC 4.0 INR RANGE 2 - 3  Dietary changes: no    Health status changes: no    Bleeding/hemorrhagic complications: no    Recent/future hospitalizations: no    Any changes in medication regimen? no    Recent/future dental: no  Any missed doses?: no       Is patient compliant with meds? yes       Allergies: No Known Drug Allergies  Anticoagulation Management History:      The patient is taking warfarin and comes in today for a routine follow up visit.  Positive risk factors for bleeding include an age of 75 years or older.  The bleeding index is 'intermediate risk'.  Positive CHADS2 values include History of HTN and Age > 75 years old.  The start date was 04/18/2003.  His last INR was 1.49.  Anticoagulation responsible provider: Graciela Husbands MD, Viviann Spare.  INR POC: 4.0.  Exp: 09/2010.    Anticoagulation Management Assessment/Plan:      The patient's current anticoagulation dose is Warfarin sodium 5 mg tabs: Take 11/2 by mo uth daily as directed.  The target INR is 2 - 3.  The next INR is due 01/18/2010.  Anticoagulation instructions were given to patient.  Results were reviewed/authorized by Robyn Haber, RN, BSN.  He was notified by Charlena Cross, RN, BSN.         Prior Anticoagulation Instructions: coumadin 10 mg daily with 12.5 mg on Friday  Current Anticoagulation Instructions: no coumadin today, coumadin 5 mg tomorrow, then coumadin 10 mg daily

## 2011-01-13 NOTE — Medication Information (Signed)
Summary: CCR/AMD  Anticoagulant Therapy  Managed by: Cloyde Reams, RN, BSN Referring MD: Dr. Laurita Quint Supervising MD: Mariah Milling Indication 1: Atrial Fibrillation (ICD-427.31) Lab Used: [facility] [place] Site: [place] INR POC 2.4 INR RANGE 2 - 3  Dietary changes: no    Health status changes: no    Bleeding/hemorrhagic complications: no    Recent/future hospitalizations: no    Any changes in medication regimen? no    Recent/future dental: no  Any missed doses?: no       Is patient compliant with meds? yes       Allergies (verified): No Known Drug Allergies  Anticoagulation Management History:      The patient is taking warfarin and comes in today for a routine follow up visit.  Positive risk factors for bleeding include an age of 75 years or older.  The bleeding index is 'intermediate risk'.  Positive CHADS2 values include History of HTN and Age > 75 years old.  The start date was 04/18/2003.  His last INR was 3.6.  Anticoagulation responsible provider: Gollan.  INR POC: 2.4.  Cuvette Lot#: [id number].  Exp: 04/2011.    Anticoagulation Management Assessment/Plan:      The patient's current anticoagulation dose is Warfarin sodium 5 mg tabs: Take 11/2 by mo uth daily as directed.  The target INR is 2 - 3.  The next INR is due 05/10/2010.  Anticoagulation instructions were given to patient.  Results were reviewed/authorized by Cloyde Reams, RN, BSN.  He was notified by Cloyde Reams RN.         Prior Anticoagulation Instructions: The patient is to continue with the same dose of coumadin.  This dosage includes: coumadin 10 mg daily with 7.5 mg on T Th Sa  Current Anticoagulation Instructions: INR 2.4  Continue on same dosage 10mg  daily except 7.5mg  on Tuesdays, Thursdays, and Saturdays.  Recheck in 4 weeks.

## 2011-01-13 NOTE — Assessment & Plan Note (Signed)
Summary: 6 MONTH FOLLOW UP RECHECK/RBH   Vital Signs:  Patient profile:   77 year old male Weight:      161.25 pounds BMI:     27.78 Temp:     97.5 degrees F oral Pulse rate:   80 / minute Pulse rhythm:   irregular BP sitting:   148 / 90  (left arm) Cuff size:   regular  Vitals Entered By: Sydell Axon LPN (February 17, 1609 9:19 AM) CC: 6 Month follow-up, wants to know if he needs a pneumonia vaccine   History of Present Illness: Eric [name] is a 77 y/o caucasian male who presents today for a 6 mo f/u on depression/anxiety after starting Sertraline.  He states that he is feeling very well on the medication and that he stays busy keeping up his house and taking care of his chickens.  He regularly goes to a Coumadin clinic in [place] to manage his Coumadin which he is taking due to AFib.  There were no other medical complaints todays and the patient is tolerating his current regimen of medications well.  Patient asked about Pneumovax and tetanus vaccine to see if he was current.  Problems Prior to Update: 1)  Anxiety State, Unspecified  (ICD-300.00) 2)  Low Back Pain Syndrome  (ICD-724.2) 3)  Encounter For Long-term Use of Anticoagulants  (ICD-V58.61) 4)  Encounter For Therapeutic Drug Monitoring  (ICD-V58.83) 5)  Nontraumatic Rupture of Tendons of Biceps  (ICD-727.62) 6)  Health Maintenance Exam  (ICD-V70.0) 7)  Erectile Dysfunction, Organic  (ICD-607.84) 8)  Screening For Malignannt Neoplasm, Site Nec  (ICD-V76.49) 9)  Fatty Liver Disease- Abd. U/s 11/01, Mild Elevated Lft's  (ICD-571.8) 10)  Sexual Dysfunction  (ICD-302.70) 11)  Atrial Fibrillation  (ICD-427.31) 12)  Screening For Malignant Neoplasm, Prostate  (ICD-V76.44) 13)  Hyperlipidemia Nec/nos  (ICD-272.4) 14)  Hypertension, Benign Essential  (ICD-401.1)  Medications Prior to Update: 1)  Warfarin Sodium 5 Mg Tabs (Warfarin Sodium) .... Take 11/2 By Miki Kins Daily As Directed 2)  Zocor 40 Mg Tabs (Simvastatin) .... Take One By  Mouth Qhs 3)  Saw Palmetto   Caps (Saw Palmetto (Serenoa Repens) Caps) .... Take One By Mouth Daily 4)  One-Daily Multivitamins   Tabs (Multiple Vitamin) .... Take One By Mouth Daily 5)  Honey .... Take One Spoonful By Mouth Daily 6)  Viagra 100 Mg  Tabs (Sildenafil Citrate) .... One Tab By Mouth One Hour Prior To Relations. 7)  Metoprolol Tartrate 100 Mg  Tabs (Metoprolol Tartrate) .Marland Kitchen.. 1 Tablet Twice A Day By Mouth 8)  Neurontin 300 Mg Caps (Gabapentin) .Marland Kitchen.. 1  Tablet Three Times A Day By Mouth. 9)  Zoloft 50 Mg Tabs (Sertraline Hcl) .Marland Kitchen.. 1 Tab By Mouth Daily.  Allergies: No Known Drug Allergies  Physical Exam  General:  Well-developed,well-nourished,in no acute distress; alert,appropriate and cooperative throughout examination Head:  Normocephalic and atraumatic without obvious abnormalities. No apparent alopecia, slight  balding. Eyes:  Conjunctiva clear bilaterally.  Ears:  External ear exam shows no significant lesions or deformities.  Otoscopic examination reveals clear canals, tympanic membranes are intact bilaterally without bulging, retraction, inflammation or discharge. Hearing is grossly normal bilaterally. TMs occluded bilat. Nose:  External nasal examination shows no deformity or inflammation. Nasal mucosa are pink and moist without lesions or exudates. Mouth:  Oral mucosa and oropharynx without lesions or exudates.  Teeth in moderate  repair. Lungs:  Normal respiratory effort, chest expands symmetrically. Lungs are clear to auscultation, no crackles or  wheezes. Heart:  Irregular heart rate. S1 and S2 normal without gallop, murmur, click, rub or other extra sounds.   Impression & Recommendations:  Problem # 1:  ANXIETY STATE, UNSPECIFIED (ICD-300.00) Assessment Improved  Much better, cont meds. Discussed tapering if wants to stop. His updated medication list for this problem includes:    Zoloft 50 Mg Tabs (Sertraline hcl) .Marland Kitchen... 1 tab by mouth daily.  Discussed  medication use and relaxation techniques.   Complete Medication List: 1)  Warfarin Sodium 5 Mg Tabs (Warfarin sodium) .... Take 11/2 by mo uth daily as directed 2)  Zocor 40 Mg Tabs (Simvastatin) .... Take one by mouth at bedtime 3)  Saw Palmetto Caps (Saw palmetto (serenoa repens) caps) .... Take one by mouth daily 4)  One-daily Multivitamins Tabs (Multiple vitamin) .... Take one by mouth daily 5)  Honey  .... Take one spoonful by mouth daily 6)  Metoprolol Tartrate 100 Mg Tabs (Metoprolol tartrate) .Marland Kitchen.. 1 tablet twice a day by mouth 7)  Neurontin 300 Mg Caps (Gabapentin) .Marland Kitchen.. 1  tablet three times a day by mouth. 8)  Zoloft 50 Mg Tabs (Sertraline hcl) .Marland Kitchen.. 1 tab by mouth daily.  Patient Instructions: 1)  RTC 10/11 for Comp Exam.  Current Allergies (reviewed today): No known allergies

## 2011-01-13 NOTE — Medication Information (Signed)
Summary: rov/ewj  Anticoagulant Therapy  Managed by: Cloyde Reams, RN, BSN Referring MD: Dr. Laurita Quint Supervising MD: Mariah Milling Indication 1: Atrial Fibrillation (ICD-427.31) Lab Used: [facility] [place] Site: [place] INR POC 3.5 INR RANGE 2 - 3  Dietary changes: no    Health status changes: no    Bleeding/hemorrhagic complications: no    Recent/future hospitalizations: no    Any changes in medication regimen? no    Recent/future dental: no  Any missed doses?: no       Is patient compliant with meds? yes       Allergies: No Known Drug Allergies  Anticoagulation Management History:      The patient is taking warfarin and comes in today for a routine follow up visit.  Positive risk factors for bleeding include an age of 75 years or older.  The bleeding index is 'intermediate risk'.  Positive CHADS2 values include History of HTN and Age > 75 years old.  The start date was 04/18/2003.  His last INR was 3.6.  Anticoagulation responsible provider: gollan.  INR POC: 3.5.  Cuvette Lot#: [id number].  Exp: 10/2011.    Anticoagulation Management Assessment/Plan:      The patient's current anticoagulation dose is Warfarin sodium 5 mg tabs: Take 11/2 by mo uth daily as directed.  The target INR is 2 - 3.  The next INR is due 10/27/2010.  Anticoagulation instructions were given to patient.  Results were reviewed/authorized by Cloyde Reams, RN, BSN.  He was notified by Cloyde Reams RN.         Prior Anticoagulation Instructions: INR 2.5  Continue on same dosage 1.5 tablets daily except 2 tablets on Mondays, Wednesdays, and Fridays.  Recheck in 4 weeks.    Current Anticoagulation Instructions: INR 3.5  Skip today's dosage of Coumadin, then resume same dosage 1.5 tablets daily except 2 tablets on Mondays, Wednesdays, and Fridays.  Recheck in 3 weeks.

## 2011-01-13 NOTE — Medication Information (Signed)
Summary: CCR/AMD  Anticoagulant Therapy  Managed by: Cloyde Reams, RN, BSN Referring MD: Dr. Laurita Quint Supervising MD: Ladona Ridgel MD, Sharlot Gowda Indication 1: Atrial Fibrillation (ICD-427.31) Lab Used: [facility] [place] Site: [place] INR POC 2.6 INR RANGE 2 - 3  Dietary changes: no     Bleeding/hemorrhagic complications: no     Any changes in medication regimen? yes       Details: started on Mobic 7.5mg  qd and Lamisil 250mg  qd on 04/30/10   Any missed doses?: no       Is patient compliant with meds? yes       Allergies: No Known Drug Allergies  Anticoagulation Management History:      The patient is taking warfarin and comes in today for a routine follow up visit.  Positive risk factors for bleeding include an age of 75 years or older.  The bleeding index is 'intermediate risk'.  Positive CHADS2 values include History of HTN and Age > 25 years old.  The start date was 04/18/2003.  His last INR was 3.6.  Anticoagulation responsible provider: Ladona Ridgel MD, Sharlot Gowda.  INR POC: 2.6.  Cuvette Lot#: [id number].  Exp: 07/2011.    Anticoagulation Management Assessment/Plan:      The patient's current anticoagulation dose is Warfarin sodium 5 mg tabs: Take 11/2 by mo uth daily as directed.  The target INR is 2 - 3.  The next INR is due 06/01/2010.  Anticoagulation instructions were given to patient.  Results were reviewed/authorized by Cloyde Reams, RN, BSN.  He was notified by Cloyde Reams RN.         Prior Anticoagulation Instructions: INR 3.4  Skip tomorrow's dosage of coumadin, then start taking 7.5mg  daily except 10mg  on Mondays and Fridays.  Recheck in 1 week-10 days.    Current Anticoagulation Instructions: INR 2.6  Continue on same dosage 7.5mg  daily except 10mg  on Mondays and Fridays.  Recheck in 2 weeks.

## 2011-01-13 NOTE — Medication Information (Signed)
Summary: CCR/AMD   Anticoagulant Therapy  Managed by: Cloyde Reams, RN, BSN Referring MD: Dr. Laurita Quint Supervising MD: Mariah Milling Indication 1: Atrial Fibrillation (ICD-427.31) Lab Used: [facility] [place] Site: [place] INR POC 2.4 INR RANGE 2 - 3  Dietary changes: no     Bleeding/hemorrhagic complications: no     Any changes in medication regimen? no     Any missed doses?: no       Is patient compliant with meds? yes       Allergies: No Known Drug Allergies  Anticoagulation Management History:      The patient is taking warfarin and comes in today for a routine follow up visit.  Positive risk factors for bleeding include an age of 75 years or older.  The bleeding index is 'intermediate risk'.  Positive CHADS2 values include History of HTN and Age > 75 years old.  The start date was 04/18/2003.  His last INR was 3.6.  Anticoagulation responsible provider: Chalsey Leeth.  INR POC: 2.4.  Cuvette Lot#: [id number].  Exp: 08/2011.    Anticoagulation Management Assessment/Plan:      The patient's current anticoagulation dose is Warfarin sodium 5 mg tabs: Take 11/2 by mo uth daily as directed.  The target INR is 2 - 3.  The next INR is due 07/21/2010.  Anticoagulation instructions were given to patient.  Results were reviewed/authorized by Cloyde Reams, RN, BSN.  He was notified by Benedict Needy, RN.         Prior Anticoagulation Instructions: INR 2.9  Continue on same dosage 7.5mg  daily except 10mg  on Mondays and Fridays.  Recheck in 3 weeks.    Current Anticoagulation Instructions: INR 2.4  Continue taking 1.5 tabs daily except 2 tabs on Monday and Friday. Recheck in 4 weeks.

## 2011-01-13 NOTE — Assessment & Plan Note (Signed)
Summary: CPX/CLE   Vital Signs:  Patient profile:   77 year old male Weight:      165.75 pounds BMI:     28.55 Temp:     97.1 degrees F oral Pulse rate:   76 / minute Pulse rhythm:   irregular BP sitting:   140 / 76  (left arm) Cuff size:   regular  Vitals Entered By: Sydell Axon LPN (December 01, 2010 10:42 AM) CC: 30 Minute checkup   History of Present Illness: Pt here for Comp Exam. He is bothered by arthritis...he takes Tyl Arthritis. He takes 2 pills a day which helps. He has stopped most of his medications. It looks like he hasn't been on Zocor for a couple of years. He gets uptight when he gets here and anxiety from his son separating from his wife. There is also anxiety from granddaughter being gone with grandson in law who [place] have a greatgrandchild by now but they have no way of knowing and no communication with them. He is to get brace for right foot due to what sounds like drop foot.  Preventive Screening-Counseling & Management  Alcohol-Tobacco     Alcohol drinks/day: 0     Smoking Status: never     Passive Smoke Exposure: no  Caffeine-Diet-Exercise     Caffeine use/day: 2     Does Patient Exercise: yes     Type of exercise: walks regularly     Exercise (avg: min/session): 30-60     Times/week: 7  Problems Prior to Update: 1)  Vitamin D Deficiency  (ICD-268.9) 2)  Anxiety State, Unspecified  (ICD-300.00) 3)  Low Back Pain Syndrome  (ICD-724.2) 4)  Encounter For Long-term Use of Anticoagulants  (ICD-V58.61) 5)  Encounter For Therapeutic Drug Monitoring  (ICD-V58.83) 6)  Nontraumatic Rupture of Tendons of Biceps  (ICD-727.62) 7)  Health Maintenance Exam  (ICD-V70.0) 8)  Erectile Dysfunction, Organic  (ICD-607.84) 9)  Screening For Malignannt Neoplasm, Site Nec  (ICD-V76.49) 10)  Fatty Liver Disease- Abd. U/s 11/01, Mild Elevated Lft's  (ICD-571.8) 11)  Sexual Dysfunction  (ICD-302.70) 12)  Atrial Fibrillation  (ICD-427.31) 13)  Screening For Malignant  Neoplasm, Prostate  (ICD-V76.44) 14)  Hyperlipidemia Nec/nos  (ICD-272.4) 15)  Hypertension, Benign Essential  (ICD-401.1)  Medications Prior to Update: 1)  Warfarin Sodium 5 Mg Tabs (Warfarin Sodium) .... Take 11/2 By Miki Kins Daily As Directed 2)  Zocor 40 Mg Tabs (Simvastatin) .... Take One By Mouth At Bedtime 3)  Saw Palmetto   Caps (Saw Palmetto (Serenoa Repens) Caps) .... Take One By Mouth Daily 4)  One-Daily Multivitamins   Tabs (Multiple Vitamin) .... Take One By Mouth Daily 5)  Honey .... Take One Spoonful By Mouth Daily 6)  Metoprolol Tartrate 100 Mg  Tabs (Metoprolol Tartrate) .Marland Kitchen.. 1 Tablet Twice A Day By Mouth 7)  Neurontin 300 Mg Caps (Gabapentin) .Marland Kitchen.. 1  Tablet Three Times A Day By Mouth. 8)  Zoloft 50 Mg Tabs (Sertraline Hcl) .Marland Kitchen.. 1 Tab By Mouth Daily.  Current Medications (verified): 1)  Warfarin Sodium 5 Mg Tabs (Warfarin Sodium) .... Take 11/2 By Miki Kins Daily As Directed 2)  Zocor 40 Mg Tabs (Simvastatin) .... Take One By Mouth At Bedtime 3)  Honey .... Take One Spoonful By Mouth Daily 4)  Metoprolol Tartrate 100 Mg  Tabs (Metoprolol Tartrate) .Marland Kitchen.. 1 Tablet Twice A Day By Mouth 5)  Tylenol Arthritis Pain 650 Mg Cr-Tabs (Acetaminophen) .... Take 2 By Mouth Daily  Allergies: No Known Drug  Allergies  Past History:  Past Medical History: Last updated: 05/28/2007 Atrial fibrillation Hypertension  Past Surgical History: Last updated: 05/28/2007 Abd. U/S- fatty liver 11/01 Pneumonia  03/06/2003  Family History: Last updated: 12/27/10 Father: Died at age 72 of a stroke Mother: Died at age of 40 of cerebral hemorrhage Brother A 97 Afib Coumadin PGF died of a stroke at 18 Uncle Colon Ca at early age.   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   Review of Systems General:  Denies chills, fatigue, fever, sweats, weakness, and weight loss. Eyes:  Denies blurring, discharge, and eye pain; eye exam 10/11 with early cataracts.. ENT:  Denies decreased hearing, earache, and ringing in ears. CV:  Denies chest pain or discomfort, fainting, fatigue, palpitations, shortness of breath with exertion, swelling of feet, and swelling of hands; has inflammation of hands and feet so can't tell if swollen.Marland Kitchen Resp:  Denies cough, shortness of breath, and wheezing. GI:  Denies abdominal pain, bloody stools, change in bowel habits, constipation, diarrhea, indigestion, loss of appetite, nausea, vomiting, vomiting blood, and yellowish skin color. GU:  Complains of nocturia; denies discharge, dysuria, and urinary frequency; one consistently. MS:  Complains of joint pain, low back pain, and stiffness; denies muscle aches and cramps; signif arthritis. Derm:  Denies dryness, itching, and rash; sawe Dermatologist and was given 3 month "pill" which cleared his nails and skin of the hands....fungal medication.. Neuro:  Complains of poor balance; denies numbness, tingling, and tremors; occas forward imbalance.Marland Kitchen  Physical Exam  General:  Well-developed,well-nourished,in no acute distress; alert,appropriate and cooperative throughout examination Head:  Normocephalic and atraumatic without obvious abnormalities. No apparent alopecia, slight  balding. Eyes:  Conjunctiva clear bilaterally.  Ears:  External ear exam shows no significant lesions or deformities.  Otoscopic examination reveals clear canals, tympanic membranes are intact bilaterally without bulging, retraction, inflammation or discharge.  Hearing is grossly normal bilaterally. TMs occluded bilat. Nose:  External nasal examination shows no deformity or inflammation. Nasal mucosa are pink and moist without lesions or exudates. Mouth:  Oral mucosa and oropharynx without lesions or exudates.  Teeth in moderate  repair. Neck:  No deformities, masses, or tenderness noted. Chest Wall:  No deformities, masses, tenderness or gynecomastia noted. Breasts:  No masses or gynecomastia noted Lungs:  Normal respiratory effort, chest expands symmetrically. Lungs are clear to auscultation, no crackles or wheezes. Heart:  Regular heart rate today, irreg in the past. S1 and S2 normal without gallop, murmur, click, rub or other extra sounds. Abdomen:  Bowel sounds positive,abdomen soft and non-tender without masses, organomegaly or hernias noted. Rectal:  No external abnormalities noted. Normal sphincter tone. No rectal masses or tenderness. G neg. Genitalia:  Testes bilaterally descended without nodularity, tenderness or masses. No scrotal masses or lesions. No penis lesions or urethral discharge. No hernia noted. Prostate:  Prostate gland firm and smooth, no enlargement, nodularity, tenderness, mass, asymmetry or induration.  20 gms. Msk:  No deformity or scoliosis noted of thoracic or lumbar spine.  Signif kyphosis noted and general arthritic changes of multiple  joints. Pulses:  R and L carotid,radial,femoral,dorsalis pedis and posterior tibial pulses are full and equal bilaterally Extremities:  No clubbing, cyanosis, edema, or deformity noted with mild decreased  range of motion of all joints.  L and R  biceps with defects from tendon ruptures. Neurologic:  No cranial nerve deficits noted. Station and gait are slightly broad based, stiff when first initiates gait. Sensory, motor and coordinative functions appear intact. Skin:  Intact without suspicious lesions or rashes, multiple AKs and benign moles, sundamaged skin in usual areas. Psoriasis on  buttocks and in scalp, some extensor surfaces. Dermatitis of right hand has resolved on the flexor MCP joint areas. Cervical Nodes:  No lymphadenopathy noted Inguinal Nodes:  No significant adenopathy Psych:  Cognition and judgment appear intact. Alert and cooperative with normal attention span and concentration. No apparent delusions, illusions, hallucinations, good eye contact but significant stressors in his life with anxiousness and depression which are significantly improved.   Impression & Recommendations:  Problem # 1:  HEALTH MAINTENANCE EXAM (ICD-V70.0) Assessment Comment Only I have personally reviewed the Medicare Annual Wellness questionnaire and have noted 1.   The patient's medical and social history 2.   Their use of alcohol, tobacco or illicit drugs 3.   Their current medications and supplements 4.   The patient's functional ability including ADL's, fall risks, home safety risks and hearing or visual             impairment. 5.   Diet and physical activities 6.   Evidence for depression or mood disorders  Orders: Gastroenterology Referral (GI)  Problem # 2:  VITAMIN D DEFICIENCY (ICD-268.9) Assessment: Deteriorated Start taking Vit D 1000Iu two times a day regularly.  Problem # 3:  LOW BACK PAIN SYNDROME (ICD-724.2) Assessment: Unchanged  Stable but with mild discomfort. His updated medication list for this problem includes:    Tylenol Arthritis Pain 650 Mg Cr-tabs (Acetaminophen) .Marland Kitchen... Take 2 by mouth daily  Discussed use of moist heat or ice, modified activities, medications, and stretching/strengthening exercises. Back care instructions given. To be seen in 2 weeks if no improvement; sooner if worsening of symptoms.   Problem # 4:  ANXIETY STATE, UNSPECIFIED (ICD-300.00) Assessment: Deteriorated My opinion is he can take medication if he so desires but not overwhelmingly necessary.  The following medications were removed from the medication list:    Zoloft 50  Mg Tabs (Sertraline hcl) .Marland Kitchen... 1 tab by mouth daily.  Problem # 5:  FATTY LIVER DISEASE- ABD. U/S 11/01, MILD ELEVATED LFT'S (ICD-571.8) Assessment: Improved Doing better today.  Problem # 6:  ATRIAL FIBRILLATION (ICD-427.31) Assessment: Unchanged  Currently sounds to be in NSR...expressly the reason for Coumadin...in  and out of rhythm. His updated medication list for this problem includes:    Warfarin Sodium 5 Mg Tabs (Warfarin sodium) .Marland Kitchen... Take 11/2 by mo uth daily as directed    Metoprolol Tartrate 100 Mg Tabs (Metoprolol tartrate) .Marland Kitchen... 1 tablet twice a day by mouth  Reviewed the following: PT: 17.9 (12/28/2009)   INR: 3.6 (02/02/2010) Coumadin Dose (weekly): 60 mg (11/17/2010) Prior Coumadin Dose (weekly): 60 mg (11/17/2010) Next Protime: 12/15/2010 (dated on 11/17/2010)  Problem # 7:  SCREENING FOR MALIGNANT NEOPLASM, PROSTATE (ICD-V76.44) Assessment: Unchanged Stable PSA and exam.  Problem # 8:  HYPERLIPIDEMIA NEC/NOS (ICD-272.4) Assessment: Unchanged  LDL great, HDL and Trigs slightly elevated...would suggest starting small dose of OTC Niacin and increase gradually. His updated medication list for this problem includes:    Zocor 40  Mg Tabs (Simvastatin) .Marland Kitchen... Take one by mouth at bedtime  Labs Reviewed: SGOT: 37 (11/29/2010)   SGPT: 37 (11/29/2010)   HDL:36.70 (11/29/2010), 42.60 (07/09/2009)  LDL:DEL (06/02/2008), DEL (05/25/2007)  Chol:196 (11/29/2010), 190 (07/09/2009)  Trig:291.0 (11/29/2010), 238.0 (07/09/2009)  Problem # 9:  HYPERTENSION, BENIGN ESSENTIAL (ICD-401.1) Assessment: Improved Better today. Will hold off increasing meds.  His updated medication list for this problem includes:    Metoprolol Tartrate 100 Mg Tabs (Metoprolol tartrate) .Marland Kitchen... 1 tablet twice a day by mouth  BP today: 140/76 Prior BP: 148/90 (02/17/2010)  Labs Reviewed: K+: 4.3 (11/29/2010) Creat: : 1.1 (11/29/2010)   Chol: 196 (11/29/2010)   HDL: 36.70 (11/29/2010)   LDL: DEL  (06/02/2008)   TG: 291.0 (11/29/2010)  Complete Medication List: 1)  Warfarin Sodium 5 Mg Tabs (Warfarin sodium) .... Take 11/2 by mo uth daily as directed 2)  Zocor 40 Mg Tabs (Simvastatin) .... Take one by mouth at bedtime 3)  Honey  .... Take one spoonful by mouth daily 4)  Metoprolol Tartrate 100 Mg Tabs (Metoprolol tartrate) .Marland Kitchen.. 1 tablet twice a day by mouth 5)  Tylenol Arthritis Pain 650 Mg Cr-tabs (Acetaminophen) .... Take 2 by mouth daily  Patient Instructions: 1)  Refer for colonoscopy. 2)  RTC 6 mos.   Orders Added: 1)  Gastroenterology Referral [GI] 2)  Est. Patient 65& > [16109]    Current Allergies (reviewed today): No known allergies

## 2011-01-13 NOTE — Medication Information (Signed)
Summary: rov/ewj  Anticoagulant Therapy  Managed by: Cloyde Reams, RN, BSN Referring MD: Dr. Laurita Quint Supervising MD: Shirlee Latch MD, Freida Busman Indication 1: Atrial Fibrillation (ICD-427.31) Lab Used: [facility] [place] Site: [place] INR POC 2.4 INR RANGE 2 - 3  Dietary changes: no    Health status changes: no    Bleeding/hemorrhagic complications: no    Recent/future hospitalizations: no    Any changes in medication regimen? no    Recent/future dental: no  Any missed doses?: no       Is patient compliant with meds? yes       Allergies: No Known Drug Allergies  Anticoagulation Management History:      The patient is taking warfarin and comes in today for a routine follow up visit.  Positive risk factors for bleeding include an age of 75 years or older.  The bleeding index is 'intermediate risk'.  Positive CHADS2 values include History of HTN and Age > 2 years old.  The start date was 04/18/2003.  His last INR was 3.6.  Anticoagulation responsible provider: Shirlee Latch MD, [place].  INR POC: 2.4.  Cuvette Lot#: [id number].  Exp: 09/2011.    Anticoagulation Management Assessment/Plan:      The patient's current anticoagulation dose is Warfarin sodium 5 mg tabs: Take 11/2 by mo uth daily as directed.  The target INR is 2 - 3.  The next INR is due 09/08/2010.  Anticoagulation instructions were given to patient.  Results were reviewed/authorized by Cloyde Reams, RN, BSN.  He was notified by Cloyde Reams RN.         Prior Anticoagulation Instructions: INR 1.8  Start taking 1.5 tablets daily except 2 tablets on Mondays, Wednesdays, and Fridays.  Recheck in 3 weeks.    Current Anticoagulation Instructions: INR 2.4  Continue on same dosage 1.5 tablets daily except 2 tablets on Mondays, Wednesdays, and Fridays.  Recheck in 4 weeks.

## 2011-01-13 NOTE — Progress Notes (Signed)
Summary: refill rquest for warfarin  Phone Note Refill Request Message from:  Fax from Pharmacy  Refills Requested: Medication #1:  WARFARIN SODIUM 5 MG TABS Take 11/2 by mo uth daily as directed Faxed form form [employer] is on your desk, Dr. [name] pt.  Do you want to complete this or wait till Dr. [name] returns?  Initial call taken by: Lowella Petties CMA, AAMA,  December 30, 2010 3:48 PM  Follow-up for Phone Call        done, please send in.  Follow-up by: Crawford Givens MD,  December 30, 2010 7:55 PM  Additional Follow-up for Phone Call Additional follow up Details #1::        Faxed Lugene Fuquay CMA (AAMA)  December 31, 2010 1:05 PM

## 2011-01-13 NOTE — Medication Information (Signed)
Summary: CCR/AMD  Anticoagulant Therapy  Managed by: Cloyde Reams, RN, BSN Referring MD: Dr. Laurita Quint Supervising MD: Mariah Milling Indication 1: Atrial Fibrillation (ICD-427.31) Lab Used: [facility] [place] Site: [place] INR POC 2.9 INR RANGE 2 - 3  Dietary changes: no    Health status changes: no    Bleeding/hemorrhagic complications: no    Recent/future hospitalizations: no    Any changes in medication regimen? yes       Details: Continues on Lamisil and Mobic  Recent/future dental: no  Any missed doses?: no       Is patient compliant with meds? yes       Allergies: No Known Drug Allergies  Anticoagulation Management History:      The patient is taking warfarin and comes in today for a routine follow up visit.  Positive risk factors for bleeding include an age of 75 years or older.  The bleeding index is 'intermediate risk'.  Positive CHADS2 values include History of HTN and Age > 75 years old.  The start date was 04/18/2003.  His last INR was 3.6.  Anticoagulation responsible provider: Jahzara Slattery.  INR POC: 2.9.  Cuvette Lot#: [id number].  Exp: 08/2011.    Anticoagulation Management Assessment/Plan:      The patient's current anticoagulation dose is Warfarin sodium 5 mg tabs: Take 11/2 by mo uth daily as directed.  The target INR is 2 - 3.  The next INR is due 06/22/2010.  Anticoagulation instructions were given to patient.  Results were reviewed/authorized by Cloyde Reams, RN, BSN.  He was notified by Cloyde Reams RN.         Prior Anticoagulation Instructions: INR 2.6  Continue on same dosage 7.5mg  daily except 10mg  on Mondays and Fridays.  Recheck in 2 weeks.    Current Anticoagulation Instructions: INR 2.9  Continue on same dosage 7.5mg  daily except 10mg  on Mondays and Fridays.  Recheck in 3 weeks.

## 2011-01-13 NOTE — Miscellaneous (Signed)
Summary: Flu vaccine   Clinical Lists Changes  Observations: Added new observation of FLU VAX: Historical (10/06/2010 16:47)      Influenza Immunization History:    Influenza # 1:  Historical (10/06/2010) Received form from [facility] [employer] [facility], [address] [place], [place]

## 2011-01-13 NOTE — Medication Information (Signed)
Summary: CCR/AMD   Anticoagulant Therapy  Managed by: Robyn Haber, RN, BSN Referring MD: Dr. Laurita Quint Supervising MD: Mariah Milling Indication 1: Atrial Fibrillation (ICD-427.31) Lab Used: [facility] [place] Site: [place] INR RANGE 2 - 3  Dietary changes: no    Health status changes: no    Bleeding/hemorrhagic complications: no    Recent/future hospitalizations: no    Any changes in medication regimen? no    Recent/future dental: no  Any missed doses?: no       Is patient compliant with meds? yes       Allergies: No Known Drug Allergies  Anticoagulation Management History:      The patient is taking warfarin and comes in today for a routine follow up visit.  Positive risk factors for bleeding include an age of 75 years or older.  The bleeding index is 'intermediate risk'.  Positive CHADS2 values include History of HTN and Age > 35 years old.  The start date was 04/18/2003.  His last INR was 1.49 and today's INR is 3.6.  Anticoagulation responsible provider: [place].  Exp: 09/2010.    Anticoagulation Management Assessment/Plan:      The patient's current anticoagulation dose is Warfarin sodium 5 mg tabs: Take 11/2 by mo uth daily as directed.  The target INR is 2 - 3.  The next INR is due 02/16/2010.  Anticoagulation instructions were given to patient.  Results were reviewed/authorized by Robyn Haber, RN, BSN.  He was notified by Charlena Cross, RN, BSN.         Prior Anticoagulation Instructions: coumadin 7.5 mg on Tues, coumadin 10 mg all other days.  Current Anticoagulation Instructions: coumadin 7.5 mg on Tues and Thurs, 10 mg all other days

## 2011-01-13 NOTE — Medication Information (Signed)
Summary: ccr   Anticoagulant Therapy  Managed by: Robyn Haber, RN, BSN Referring MD: Dr. Laurita Quint Supervising MD: Shirlee Latch MD, Freida Busman Indication 1: Atrial Fibrillation (ICD-427.31) Lab Used: [facility] Site: [facility] INR POC 1.7 INR RANGE 2 - 3  Dietary changes: no    Health status changes: no    Bleeding/hemorrhagic complications: no    Recent/future hospitalizations: no    Any changes in medication regimen? no    Recent/future dental: no  Any missed doses?: no       Is patient compliant with meds? yes       Allergies: No Known Drug Allergies  Anticoagulation Management History:      The patient is taking warfarin and comes in today for a routine follow up visit.  Positive risk factors for bleeding include an age of 75 years or older.  The bleeding index is 'intermediate risk'.  Positive CHADS2 values include History of HTN and Age > 72 years old.  The start date was 04/18/2003.  His last INR was 5.3 ratio.  Anticoagulation responsible provider: Shirlee Latch MD, [facility] [place].  INR POC: 1.7.  Exp: 09/2010.    Anticoagulation Management Assessment/Plan:      The patient's current anticoagulation dose is Warfarin sodium 5 mg tabs: Take 11/2 by mo uth daily as directed.  The target INR is 2 - 3.  The next INR is due 01/01/2010.  Anticoagulation instructions were given to patient.  Results were reviewed/authorized by Robyn Haber, RN, BSN.  He was notified by Charlena Cross, RN, BSN.         Prior Anticoagulation Instructions: coumadin 12.5 mg today (2 and 1/2 tablets) then coumadin 10mg  daily with 7.5 mg on Sun  Current Anticoagulation Instructions: coumadin 10 mg daily with 12.5 mg on Friday

## 2011-01-13 NOTE — Medication Information (Signed)
Summary: ccr   Anticoagulant Therapy  Managed by: Robyn Haber, RN, BSN Referring MD: Dr. Laurita Quint Supervising MD: Mariah Milling Indication 1: Atrial Fibrillation (ICD-427.31) Lab Used: [facility] [place] Site: [place] INR POC 2.5 INR RANGE 2 - 3  Dietary changes: no    Health status changes: no    Bleeding/hemorrhagic complications: no    Recent/future hospitalizations: no    Any changes in medication regimen? no    Recent/future dental: no  Any missed doses?: no       Is patient compliant with meds? yes       Allergies: No Known Drug Allergies  Anticoagulation Management History:      The patient is taking warfarin and comes in today for a routine follow up visit.  Positive risk factors for bleeding include an age of 75 years or older.  The bleeding index is 'intermediate risk'.  Positive CHADS2 values include History of HTN and Age > 75 years old.  The start date was 04/18/2003.  His last INR was 3.6.  Anticoagulation responsible provider: [facility].  INR POC: 2.5.  Exp: 09/2010.    Anticoagulation Management Assessment/Plan:      The patient's current anticoagulation dose is Warfarin sodium 5 mg tabs: Take 11/2 by mo uth daily as directed.  The target INR is 2 - 3.  The next INR is due 04/12/2010.  Anticoagulation instructions were given to patient.  Results were reviewed/authorized by Robyn Haber, RN, BSN.  He was notified by Charlena Cross, RN, BSN.         Prior Anticoagulation Instructions: coumadin 10 mg daily with 7.5 mg on T Th Sa  Current Anticoagulation Instructions: The patient is to continue with the same dose of coumadin.  This dosage includes: coumadin 10 mg daily with 7.5 mg on T Th Sa

## 2011-01-13 NOTE — Letter (Signed)
Summary: Dr.Mark Dumonski,[facility],Note  Dr.Mark Dumonski,[facility],Note   Imported By: Beau Fanny 01/04/2011 11:31:20  _____________________________________________________________________  External Attachment:    Type:   Image     Comment:   External Document

## 2011-01-13 NOTE — Letter (Signed)
Summary: Nadara Eaton letter  [place] [facility]  [address] [place], [place] [address]   Phone: [phone]  Fax: [phone]       07/20/2010 MRN: [id number]  [facility] [address] [place], [place]  [address]  Dear Mr. [facility] [place], and [facility] announce the retirement of Arta Silence, M.D., from full-time practice at the [facility] office effective June 10, 2010 and his plans of returning part-time.  It is important to Dr. Hetty Ely and to our practice that you understand that [facility] has seven physicians in our office for your health care needs.  We will continue to offer the same exceptional care that you have today.    Dr. Hetty Ely has spoken to many of you about his plans for retirement and returning part-time in the fall.   We will continue to work with you through the transition to schedule appointments for you in the office and meet the high standards that [facility] is committed to.   Again, it is with great pleasure that we share the news that Dr. Hetty Ely will return to [facility] at [facility] in October of 2011 with a reduced schedule.    If you have any questions, or would like to request an appointment with one of our physicians, please call us at [phone] and press the option for Scheduling an appointment.  We take pleasure in providing you with excellent patient care and look forward to seeing you at your next office visit.  Our [facility] Physicians are:  Tillman Abide, M.D. Laurita Quint, M.D. Roxy Manns, M.D. Kerby Nora, M.D. Hannah Beat, M.D. Ruthe Mannan, M.D. We proudly welcomed Raechel Ache, M.D. and Eustaquio Boyden, M.D. to the practice in July/August 2011.  Sincerely,  [place] [facility]

## 2011-01-13 NOTE — Medication Information (Signed)
Summary: rov/ewj  Anticoagulant Therapy  Managed by: Cloyde Reams, RN, BSN Referring MD: Dr. Laurita Quint Supervising MD: Shirlee Latch MD, Freida Busman Indication 1: Atrial Fibrillation (ICD-427.31) Lab Used: [facility] [place] Site: [place] INR POC 2.5 INR RANGE 2 - 3  Dietary changes: no    Health status changes: no    Bleeding/hemorrhagic complications: no    Recent/future hospitalizations: no    Any changes in medication regimen? no    Recent/future dental: no  Any missed doses?: no       Is patient compliant with meds? yes       Allergies: No Known Drug Allergies  Anticoagulation Management History:      The patient is taking warfarin and comes in today for a routine follow up visit.  Positive risk factors for bleeding include an age of 75 years or older.  The bleeding index is 'intermediate risk'.  Positive CHADS2 values include History of HTN and Age > 32 years old.  The start date was 04/18/2003.  His last INR was 3.6.  Anticoagulation responsible provider: Shirlee Latch MD, [place].  INR POC: 2.5.  Cuvette Lot#: [id number].  Exp: 10/2011.    Anticoagulation Management Assessment/Plan:      The patient's current anticoagulation dose is Warfarin sodium 5 mg tabs: Take 11/2 by mo uth daily as directed.  The target INR is 2 - 3.  The next INR is due 10/06/2010.  Anticoagulation instructions were given to patient.  Results were reviewed/authorized by Cloyde Reams, RN, BSN.  He was notified by Cloyde Reams RN.         Prior Anticoagulation Instructions: INR 2.4  Continue on same dosage 1.5 tablets daily except 2 tablets on Mondays, Wednesdays, and Fridays.  Recheck in 4 weeks.  Current Anticoagulation Instructions: INR 2.5  Continue on same dosage 1.5 tablets daily except 2 tablets on Mondays, Wednesdays, and Fridays.  Recheck in 4 weeks.

## 2011-01-13 NOTE — Medication Information (Signed)
Summary: rov/tm  Anticoagulant Therapy  Managed by: Cloyde Reams, RN, BSN Referring MD: Dr. Laurita Quint Supervising MD: Shirlee Latch MD, Freida Busman Indication 1: Atrial Fibrillation (ICD-427.31) Lab Used: [facility] [place] Site: [place] INR POC 2.2 INR RANGE 2 - 3  Dietary changes: no    Health status changes: no    Bleeding/hemorrhagic complications: no    Recent/future hospitalizations: no    Any changes in medication regimen? no    Recent/future dental: no  Any missed doses?: no       Is patient compliant with meds? yes       Allergies: No Known Drug Allergies  Anticoagulation Management History:      The patient is taking warfarin and comes in today for a routine follow up visit.  Positive risk factors for bleeding include an age of 75 years or older.  The bleeding index is 'intermediate risk'.  Positive CHADS2 values include History of HTN and Age > 26 years old.  The start date was 04/18/2003.  His last INR was 3.6.  Anticoagulation responsible provider: Shirlee Latch MD, Dariann Huckaba.  INR POC: 2.2.  Cuvette Lot#: [id number].  Exp: 01/2012.    Anticoagulation Management Assessment/Plan:      The patient's current anticoagulation dose is Warfarin sodium 5 mg tabs: Take 11/2 by mo uth daily as directed.  The target INR is 2 - 3.  The next INR is due 01/19/2011.  Anticoagulation instructions were given to patient.  Results were reviewed/authorized by Cloyde Reams, RN, BSN.  He was notified by Cloyde Reams RN.         Prior Anticoagulation Instructions: INR 2.5 Continue 7.5mg s daily except 10mg s on Mondays, Wednesdays and Fridays. REcheck in 4 weeks.   Current Anticoagulation Instructions: INR 2.2  Continue on same dosage 7.5mg  daily except 10mg  on Mondays, Wednesdays, and Fridays.  Recheck in 5 weeks.

## 2011-01-13 NOTE — Assessment & Plan Note (Signed)
Summary: SURGICAL CLEARENCE / LFW   Vital Signs:  Patient profile:   77 year old male Weight:      161 pounds Temp:     97.7 degrees F oral Pulse rate:   78 / minute Pulse rhythm:   regular BP sitting:   102 / 60  (left arm) Cuff size:   large  Vitals Entered By: Mervin Hack CMA Duncan Dull) (January 06, 2011 10:33 AM) CC: surgical clearance   History of Present Illness: Pt here for surgical clearance of neck decompression. He is having impingement of the cord with stenosis. He scheduled foir 6 hr surgery 02/10/11 with Dr Yevette Edwards. He has not seen Cardiology for quite some time. He is on Coumadin for longtime Afib. He has seen Dr Tenny Craw in the past. He has no complaints today. He had labs done in Dec.  Preventive Screening-Counseling & Management  Alcohol-Tobacco     Alcohol drinks/day: 0     Smoking Status: never     Passive Smoke Exposure: no  Caffeine-Diet-Exercise     Caffeine use/day: 2     Does Patient Exercise: yes     Type of exercise: walks regularly     Exercise (avg: min/session): 30-60     Times/week: 7  Problems Prior to Update: 1)  Vitamin D Deficiency  (ICD-268.9) 2)  Anxiety State, Unspecified  (ICD-300.00) 3)  Low Back Pain Syndrome  (ICD-724.2) 4)  Encounter For Long-term Use of Anticoagulants  (ICD-V58.61) 5)  Encounter For Therapeutic Drug Monitoring  (ICD-V58.83) 6)  Nontraumatic Rupture of Tendons of Biceps  (ICD-727.62) 7)  Health Maintenance Exam  (ICD-V70.0) 8)  Erectile Dysfunction, Organic  (ICD-607.84) 9)  Screening For Malignannt Neoplasm, Site Nec  (ICD-V76.49) 10)  Fatty Liver Disease- Abd. U/s 11/01, Mild Elevated Lft's  (ICD-571.8) 11)  Sexual Dysfunction  (ICD-302.70) 12)  Atrial Fibrillation  (ICD-427.31) 13)  Screening For Malignant Neoplasm, Prostate  (ICD-V76.44) 14)  Hyperlipidemia Nec/nos  (ICD-272.4) 15)  Hypertension, Benign Essential  (ICD-401.1)  Medications Prior to Update: 1)  Warfarin Sodium 5 Mg Tabs (Warfarin Sodium)  .... Take 11/2 By Miki Kins Daily As Directed 2)  Zocor 40 Mg Tabs (Simvastatin) .... Take One By Mouth At Bedtime 3)  Honey .... Take One Spoonful By Mouth Daily 4)  Metoprolol Tartrate 100 Mg  Tabs (Metoprolol Tartrate) .Marland Kitchen.. 1 Tablet Twice A Day By Mouth 5)  Tylenol Arthritis Pain 650 Mg Cr-Tabs (Acetaminophen) .... Take 2 By Mouth Daily  Current Medications (verified): 1)  Warfarin Sodium 5 Mg Tabs (Warfarin Sodium) .... Take 11/2 By Miki Kins Daily As Directed 2)  Zocor 40 Mg Tabs (Simvastatin) .... Take One By Mouth At Bedtime 3)  Metoprolol Tartrate 100 Mg  Tabs (Metoprolol Tartrate) .Marland Kitchen.. 1 Tablet Twice A Day By Mouth 4)  Tylenol Arthritis Pain 650 Mg Cr-Tabs (Acetaminophen) .... Take 2 By Mouth Daily 5)  Chestal Honey Cough  Syrp (Homeopathic Products) .... Once Daily As Needed  Allergies: No Known Drug Allergies  Past History:  Past Medical History: Last updated: 05/28/2007 Atrial fibrillation Hypertension  Past Surgical History: Last updated: 05/28/2007 Abd. U/S- fatty liver 11/01 Pneumonia  03/06/2003  Family History: Last updated: 12/26/10 Father: Died at age 44 of a stroke Mother: Died at age of 91 of cerebral hemorrhage Brother A 19 Afib Coumadin PGF died of a stroke at 38 Uncle Colon Ca at early age.   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 Father: Died at age 98 of a stroke Mother: Died at age of 82 of cerebral hemorrhage Brother A 35 Afib Coumadin PGF died of a stroke at 67 Uncle Colon Ca at early age.   Social History: Reviewed history from 05/29/2007 and no changes required. 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 Eyes:  Denies blurring, discharge, and eye pain; eye exam 12/11 with early cataracts.. ENT:  Denies decreased hearing, earache, and ringing in ears. CV:  Denies chest pain or discomfort, fainting, fatigue, palpitations, shortness of breath with exertion, and swelling of hands. Resp:  Denies cough, shortness of breath, and wheezing. GI:  Denies abdominal pain, bloody stools, change in bowel habits, constipation, dark tarry stools, diarrhea, indigestion, loss of appetite, nausea, vomiting, vomiting blood, and yellowish skin color. GU:  Complains of nocturia; denies discharge, dysuria, and urinary frequency. MS:  Complains of joint pain, low back pain, and stiffness; denies muscle aches and cramps. Derm:  Denies itching and rash. Neuro:  Complains of poor balance; denies numbness, tingling, and tremors.  Physical Exam  General:  Well-developed,well-nourished,in no acute distress; alert,appropriate and cooperative throughout examination Head:  Normocephalic and atraumatic without obvious abnormalities. No apparent alopecia, slight  balding. Eyes:  Conjunctiva clear bilaterally.  Ears:  External ear exam shows no significant lesions or deformities.  Otoscopic examination reveals clear canals, tympanic membranes are intact bilaterally without bulging, retraction, inflammation or discharge. Hearing is grossly normal bilaterally. TM occluded on right, left with signif cerumen.. Nose:  External nasal examination shows no deformity or inflammation. Nasal mucosa are pink and moist without lesions or exudates. Mouth:  Oral mucosa and oropharynx without lesions or exudates.  Teeth in moderate  repair. Neck:  No deformities, masses, or tenderness noted. Chest Wall:  No deformities, masses, tenderness or gynecomastia  noted. Breasts:  No masses or gynecomastia noted Lungs:  Normal respiratory effort, chest expands symmetrically. Lungs are clear to auscultation, no crackles or wheezes. Heart:  Irregular heart rate today. S1 and S2 normal without gallop, murmur, click, rub or other extra sounds. Abdomen:  Bowel sounds positive,abdomen soft and non-tender without masses, organomegaly or hernias noted. Rectal:  Not done, done 12/11 Genitalia:  Not done,, done 12/11 Prostate:  Not done. done 12/11 Msk:  No deformity or scoliosis noted of thoracic or lumbar spine.  Signif kyphosis noted and general arthritic changes of multiple joints. Pulses:  R and L carotid,radial,femoral,dorsalis pedis and posterior tibial pulses are full and equal bilaterally Extremities:  No clubbing, cyanosis, edema, or deformity noted with mild decreased  range of motion of all joints.  L and R  biceps with defects from tendon ruptures. Neurologic:  No cranial nerve deficits noted. Station and gait are slightly broad based, stiff when first initiates gait, uses cane. Sensory, motor and coordinative functions appear intact. Skin:  Intact without suspicious lesions or rashes, multiple AKs and benign moles, sundamaged skin in usual areas. Psoriasis on buttocks and in scalp, some extensor surfaces. Dermatitis of right hand has resolved on the flexor MCP joint areas. Cervical Nodes:  No lymphadenopathy noted Inguinal Nodes:  No significant adenopathy Psych:  Cognition and judgment appear  intact. Alert and cooperative with normal attention span and concentration. No apparent delusions, illusions, hallucinations, good eye contact but significant stressors in his life with anxiousness and depression which are significantly improved.   Impression & Recommendations:  Problem # 1:  PREOPERATIVE EXAMINATION (ICD-V72.84) Assessment New Cleared for surgery pending Cardiology approval. Orders: Cardiology Referral (Cardiology)  Problem # 2:  VITAMIN D  DEFICIENCY (ICD-268.9) Assessment: Unchanged Increase Repl to 1000Iu two times a day.  Problem # 3:  ANXIETY STATE, UNSPECIFIED (ICD-300.00) Assessment: Unchanged Stable.  Problem # 4:  FATTY LIVER DISEASE- ABD. U/S 11/01, MILD ELEVATED LFT'S (ICD-571.8) Assessment: Improved Normalized via transaminases in Dec.  Problem # 5:  ATRIAL FIBRILLATION (ICD-427.31) Assessment: Unchanged  Stable well controlled rate but sounds like in Afib today. Have pt seen for reeval and clearance. His updated medication list for this problem includes:    Warfarin Sodium 5 Mg Tabs (Warfarin sodium) .Marland Kitchen... Take 11/2 by mo uth daily as directed    Metoprolol Tartrate 100 Mg Tabs (Metoprolol tartrate) .Marland Kitchen... 1 tablet twice a day by mouth  Orders: Cardiology Referral (Cardiology)  Reviewed the following: PT: 17.9 (12/28/2009)   INR: 3.6 (02/02/2010) Coumadin Dose (weekly): 60 mg (12/15/2010) Prior Coumadin Dose (weekly): 60 mg (12/15/2010) Next Protime: 01/19/2011 (dated on 12/15/2010)  Problem # 6:  HYPERLIPIDEMIA NEC/NOS (ICD-272.4) Assessment: Unchanged Try to start Niacin slowly. His updated medication list for this problem includes:    Zocor 40 Mg Tabs (Simvastatin) .Marland Kitchen... Take one by mouth at bedtime  Labs Reviewed: SGOT: 37 (11/29/2010)   SGPT: 37 (11/29/2010)   HDL:36.70 (11/29/2010), 42.60 (07/09/2009)  LDL:DEL (06/02/2008), DEL (05/25/2007)  Chol:196 (11/29/2010), 190 (07/09/2009)  Trig:291.0 (11/29/2010), 238.0 (07/09/2009)  Problem # 7:  HYPERTENSION, BENIGN ESSENTIAL (ICD-401.1) Assessment: Improved Well controlled. His updated medication list for this problem includes:    Metoprolol Tartrate 100 Mg Tabs (Metoprolol tartrate) .Marland Kitchen... 1 tablet twice a day by mouth  BP today: 102/60 Prior BP: 140/76 (12/01/2010)  Labs Reviewed: K+: 4.3 (11/29/2010) Creat: : 1.1 (11/29/2010)   Chol: 196 (11/29/2010)   HDL: 36.70 (11/29/2010)   LDL: DEL (06/02/2008)   TG: 291.0 (11/29/2010)  Complete  Medication List: 1)  Warfarin Sodium 5 Mg Tabs (Warfarin sodium) .... Take 11/2 by mo uth daily as directed 2)  Zocor 40 Mg Tabs (Simvastatin) .... Take one by mouth at bedtime 3)  Metoprolol Tartrate 100 Mg Tabs (Metoprolol tartrate) .Marland Kitchen.. 1 tablet twice a day by mouth 4)  Tylenol Arthritis Pain 650 Mg Cr-tabs (Acetaminophen) .... Take 2 by mouth daily 5)  Chestal Honey Cough Syrp (Homeopathic products) .... Once daily as needed 6)  Vitamin D 1000 Unit Tabs (Cholecalciferol) .... One tab by mouth two times a day  Patient Instructions: 1)  Refer to Cardiology for Preop Clearance. 2)  Start Niacin and taper up slowly.   Orders Added: 1)  Cardiology Referral [Cardiology] 2)  Est. Patient 65& > [[id number]]    Current Allergies (reviewed today): No known allergies

## 2011-01-19 ENCOUNTER — Encounter (INDEPENDENT_AMBULATORY_CARE_PROVIDER_SITE_OTHER): Payer: Medicare PPO

## 2011-01-19 ENCOUNTER — Encounter: Payer: Self-pay | Admitting: Cardiovascular Disease

## 2011-01-19 DIAGNOSIS — Z7901 Long term (current) use of anticoagulants: Secondary | ICD-10-CM

## 2011-01-19 DIAGNOSIS — I4891 Unspecified atrial fibrillation: Secondary | ICD-10-CM

## 2011-01-19 LAB — CONVERTED CEMR LAB: POC INR: 2.5

## 2011-01-19 NOTE — Medication Information (Signed)
Summary: Warfarin/[facility]  Warfarin/[facility]   Imported By: Sherian Rein 01/12/2011 08:14:26  _____________________________________________________________________  External Attachment:    Type:   Image     Comment:   External Document

## 2011-01-24 ENCOUNTER — Ambulatory Visit (INDEPENDENT_AMBULATORY_CARE_PROVIDER_SITE_OTHER): Payer: Medicare PPO | Admitting: Cardiovascular Disease

## 2011-01-24 ENCOUNTER — Encounter: Payer: Self-pay | Admitting: Cardiovascular Disease

## 2011-01-24 DIAGNOSIS — I10 Essential (primary) hypertension: Secondary | ICD-10-CM

## 2011-01-24 DIAGNOSIS — I4891 Unspecified atrial fibrillation: Secondary | ICD-10-CM

## 2011-01-24 DIAGNOSIS — E785 Hyperlipidemia, unspecified: Secondary | ICD-10-CM

## 2011-01-27 NOTE — Medication Information (Signed)
Summary: [facility]  Anticoagulant Therapy  Managed by: Cloyde Reams, RN, BSN Referring MD: Dr. Laurita Quint Supervising MD: Mariah Milling Indication 1: Atrial Fibrillation (ICD-427.31) Lab Used: [facility] [place] Site: [place] INR POC 2.5 INR RANGE 2 - 3  Dietary changes: no    Health status changes: no    Bleeding/hemorrhagic complications: no    Recent/future hospitalizations: yes       Details: Neck surgery scheduled for 02/10/11, going for cardiac clearance on 01/24/11.  Any changes in medication regimen? yes       Details: Started on Niacin  Recent/future dental: no  Any missed doses?: no       Is patient compliant with meds? yes       Allergies: No Known Drug Allergies  Anticoagulation Management History:      The patient is taking warfarin and comes in today for a routine follow up visit.  Positive risk factors for bleeding include an age of 75 years or older.  The bleeding index is 'intermediate risk'.  Positive CHADS2 values include History of HTN and Age > 75 years old.  The start date was 04/18/2003.  His last INR was 3.6.  Anticoagulation responsible provider: [facility].  INR POC: 2.5.  Cuvette Lot#: [id number].  Exp: 01/2012.    Anticoagulation Management Assessment/Plan:      The patient's current anticoagulation dose is Warfarin sodium 5 mg tabs: Take 11/2 by mo uth daily as directed.  The target INR is 2 - 3.  The next INR is due 02/23/2011.  Anticoagulation instructions were given to patient.  Results were reviewed/authorized by Cloyde Reams, RN, BSN.  He was notified by Cloyde Reams RN.         Prior Anticoagulation Instructions: INR 2.2  Continue on same dosage 7.5mg  daily except 10mg  on Mondays, Wednesdays, and Fridays.  Recheck in 5 weeks.   Current Anticoagulation Instructions: INR 2.5  Continue on same dosage 1.5 tablets daily except 2 tablets on Mondays, Wednesdays, and Fridays.  Recheck in 4 weeks.

## 2011-01-28 ENCOUNTER — Telehealth: Payer: Self-pay | Admitting: Family Medicine

## 2011-02-01 ENCOUNTER — Other Ambulatory Visit: Payer: Self-pay | Admitting: Orthopedic Surgery

## 2011-02-01 ENCOUNTER — Ambulatory Visit
Admission: RE | Admit: 2011-02-01 | Discharge: 2011-02-01 | Disposition: A | Discharge status: Home / Self Care | Payer: Medicare PPO | Source: Ambulatory Visit | Attending: Orthopedic Surgery | Admitting: Orthopedic Surgery

## 2011-02-01 DIAGNOSIS — G959 Disease of spinal cord, unspecified: Secondary | ICD-10-CM

## 2011-02-02 ENCOUNTER — Other Ambulatory Visit (HOSPITAL_COMMUNITY): Payer: Self-pay | Admitting: Orthopedic Surgery

## 2011-02-02 ENCOUNTER — Ambulatory Visit (HOSPITAL_COMMUNITY)
Admission: RE | Admit: 2011-02-02 | Discharge: 2011-02-02 | Disposition: A | Discharge status: Home / Self Care | Payer: Medicare PPO | Source: Ambulatory Visit | Attending: Orthopedic Surgery | Admitting: Orthopedic Surgery

## 2011-02-02 ENCOUNTER — Encounter (HOSPITAL_COMMUNITY)
Admission: RE | Admit: 2011-02-02 | Discharge: 2011-02-02 | Disposition: A | Discharge status: Home / Self Care | Payer: Medicare PPO | Source: Ambulatory Visit | Attending: Orthopedic Surgery | Admitting: Orthopedic Surgery

## 2011-02-02 DIAGNOSIS — Z01812 Encounter for preprocedural laboratory examination: Secondary | ICD-10-CM | POA: Insufficient documentation

## 2011-02-02 DIAGNOSIS — M542 Cervicalgia: Secondary | ICD-10-CM | POA: Insufficient documentation

## 2011-02-02 DIAGNOSIS — G959 Disease of spinal cord, unspecified: Secondary | ICD-10-CM

## 2011-02-02 DIAGNOSIS — Z01818 Encounter for other preprocedural examination: Secondary | ICD-10-CM | POA: Insufficient documentation

## 2011-02-02 LAB — COMPREHENSIVE METABOLIC PANEL
ALT: 39 U/L (ref 0–53)
Alkaline Phosphatase: 35 U/L — ABNORMAL LOW (ref 39–117)
BUN: 16 mg/dL (ref 6–23)
CO2: 26 mEq/L (ref 19–32)
GFR calc non Af Amer: >60 mL/min (ref >60)
Glucose, Bld: 93 mg/dL (ref 70–99)
Potassium: 4.7 mEq/L (ref 3.5–5.1)
Sodium: 139 mEq/L (ref 135–145)
Total Protein: 6.9 g/dL (ref 6.0–8.3)

## 2011-02-02 LAB — CBC
HCT: 42.2 % (ref 39.0–52.0)
MCH: 32 pg (ref 26.0–34.0)
MCHC: 34.1 g/dL (ref 30.0–36.0)
RDW: 12.3 % (ref 11.5–15.5)

## 2011-02-02 LAB — DIFFERENTIAL
Basophils Absolute: 0 10*3/uL (ref 0.0–0.1)
Basophils Relative: 1 % (ref 0–1)
Eosinophils Relative: 2 % (ref 0–5)
Monocytes Absolute: 0.4 10*3/uL (ref 0.1–1.0)
Monocytes Relative: 7 % (ref 3–12)

## 2011-02-02 LAB — URINALYSIS, ROUTINE W REFLEX MICROSCOPIC
Ketones, ur: NEGATIVE mg/dL
Nitrite: NEGATIVE
Protein, ur: NEGATIVE mg/dL
Urine Glucose, Fasting: NEGATIVE mg/dL
Urobilinogen, UA: 0.2 mg/dL (ref 0.0–1.0)

## 2011-02-02 LAB — PROTIME-INR
INR: 1.2 (ref 0.00–1.49)
Prothrombin Time: 15.4 seconds — ABNORMAL HIGH (ref 11.6–15.2)

## 2011-02-02 LAB — SURGICAL PCR SCREEN: Staphylococcus aureus: POSITIVE — AB

## 2011-02-02 NOTE — Progress Notes (Signed)
Summary: [place] Colonscopic cancelled..   Phone Note Call from Patient   Call For: Eric Leeks MD Summary of Call: Pt called, says he is scheduled to have surgery on his neck, and has cancelled his Colonscopic. Says he will call when ready to reschedule.Lorain Childes to Dr. Hetty Ely.Daine Gip  January 28, 2011 11:13 AM  Initial call taken by: Daine Gip,  January 28, 2011 11:13 AM  Follow-up for Phone Call        Thank you. Follow-up by: Eric Leeks MD,  January 28, 2011 11:20 AM

## 2011-02-02 NOTE — Assessment & Plan Note (Signed)
Summary: SURGICAL CLEARANCE FOR SURGERY ON HIS NECK;SURGERY 02/10/11/AMD      Allergies Added: NKDA  Visit Type:  Initial Consult Primary Provider:  Dr. Hetty Ely  CC:  Surgical clearance. denies chest pain and SOB.Eric [name]  History of Present Illness: Eric [name] is a very pleasant 77 year old gentleman with a history of atrial fibrillation who by report has been maintaining sinus rhythm, who has severe neck disease and stenosis requiring cervical and thoracic laminectomy and fusion with instrumentation, plan for the beginning of March. He presents to establish care and for preoperative evaluation.  Overall, Eric [name] states that he is doing well. He denies any significant chest pain or shortness of breath. [place] and tends to chickens and turkeys and moves wood. He denies any anginal type symptoms. He does have problems with walking as the spinal stenosis has caused right-sided weakness in his leg. He reports recently in the shower he almost had a fall as his right leg almost gave out under him.  He denies any tachycardia or symptoms consistent with atrial fibrillation over the past several years. He reports his last episode was 5 years ago when he had pneumonia.  EKG shows normal sinus rhythm with rate 79 beats per minute, no significant ST or T wave changes, first degree AV block  Preventive Screening-Counseling & Management  Alcohol-Tobacco     Smoking Status: never  Caffeine-Diet-Exercise     Does Patient Exercise: yes      Drug Use:  no.    Current Medications (verified): 1)  Warfarin Sodium 5 Mg Tabs (Warfarin Sodium) .... Take 11/2 By Miki Kins Daily As Directed 2)  Metoprolol Tartrate 100 Mg  Tabs (Metoprolol Tartrate) .Eric [name].. 1 Tablet Twice A Day By Mouth 3)  Tylenol Arthritis Pain 650 Mg Cr-Tabs (Acetaminophen) .... Take 2 By Mouth Daily 4)  Chestal Honey Cough  Syrp (Homeopathic Products) .... Once Daily As Needed 5)  Vitamin D 1000 Unit Tabs (Cholecalciferol) .... One Tab By  Mouth Two Times A Day 6)  Niacin 500 Mg Tabs (Niacin) .... Take 1 Tablet By Mouth Once A Day  Allergies (verified): No Known Drug Allergies  Past History:  Past Medical History: Last updated: 05/28/2007 Atrial fibrillation Hypertension  Past Surgical History: Last updated: 05/28/2007 Abd. U/S- fatty liver 11/01 Pneumonia  03/06/2003  Family History: Last updated: Dec 28, 2010 Father: Died at age 66 of a stroke Mother: Died at age of 4 of cerebral hemorrhage Brother A 4 Afib Coumadin PGF died of a stroke at 61 Uncle Colon Ca at early age.   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  Alcohol Use - no Regular Exercise - yes-therapy Drug Use - no  Review of Systems  The patient denies fever, weight loss, weight gain, vision loss, decreased hearing, hoarseness, chest pain, syncope, dyspnea on exertion, peripheral edema, prolonged cough, abdominal pain, incontinence, muscle weakness, depression, and enlarged lymph nodes.    Vital Signs:  Patient profile:   77 year old male Height:      64 inches Weight:  165 pounds BMI:     28.42 Pulse rate:   79 / minute BP sitting:   136 / 81  (left arm) Cuff size:   regular  Vitals Entered By: Lysbeth Galas CMA (January 24, 2011 10:41 AM)  Physical Exam  General:  Well-developed,well-nourished,in no acute distress; alert,appropriate and cooperative throughout examination Head:  normocephalic and atraumatic Neck:   Neck supple, no JVD. No masses, thyromegaly or abnormal cervical nodes. Lungs:  Clear bilaterally to auscultation and percussion. Heart:  Non-displaced PMI, chest non-tender; regular rate and rhythm, S1, S2 withI to II/VI sem RSB,   no rubs or gallops. Carotid upstroke normal, no bruit. Normal abdominal aortic size, no bruits. Femorals normal pulses, no bruits. Pedals normal pulses. No edema, no varicosities. Abdomen:  Bowel sounds positive; abdomen soft and non-tender without masses Msk:  Back normal, normal gait. Muscle strength and tone normal. Pulses:  pulses normal in all 4 extremities Extremities:  No clubbing or cyanosis. Neurologic:  Alert and oriented x 3. Skin:  Intact without lesions or rashes. Psych:  Normal affect.   Impression & Recommendations:  Problem # 1:  ATRIAL FIBRILLATION (ICD-427.31) no recent atrial fibrillation per Mr. Nanni. He has been maintained on warfarin We have ordered an echocardiogram prior to his surgery and to determine if potentially we could stop his warfarin. If he has no significant valvular disease, other cardiac pathology, we would feel more comfortable holding his anticoagulation indefinitely.  His updated medication list for this problem includes:    Warfarin Sodium 5 Mg Tabs (Warfarin sodium) .Eric [name]... Take 11/2 by mo uth daily as directed    Metoprolol Tartrate 100 Mg Tabs (Metoprolol tartrate) .Eric [name]... 1 tablet twice a day by mouth  Orders: EKG w/ Interpretation (93000) Echocardiogram (Echo)  Problem # 2:  PRE-OPERATIVE CARDIAC EXAM (ICD-V72.81) he is active with no symptoms of angina, no known coronary artery disease. He would be acceptable risk for his upcoming neck surgery at the beginning of March. He can hold his warfarin  5 to10 days prior to his surgery.  His updated medication list for this problem includes:    Warfarin Sodium 5 Mg Tabs (Warfarin sodium) .Eric [name]... Take 11/2 by mo uth daily as directed    Metoprolol Tartrate 100 Mg Tabs  (Metoprolol tartrate) .Eric [name]... 1 tablet twice a day by mouth  Problem # 3:  HYPERLIPIDEMIA NEC/NOS (ICD-272.4) Cholesterol is well controlled on his current medication regimen. We have made no changes.  The following medications were removed from the medication list:    Zocor 40 Mg Tabs (Simvastatin) .Eric [name]... Take one by mouth at bedtime His updated medication list for this problem includes:    Niacin 500 Mg Tabs (Niacin) .Eric [name]... Take 1 tablet by mouth once a day  Problem # 4:  HYPERTENSION, BENIGN ESSENTIAL (ICD-401.1) We will continue him on metoprolol. No further medication changes her blood pressure.  His updated medication list for this problem includes:    Metoprolol Tartrate 100 Mg Tabs (Metoprolol tartrate) .Eric [name]... 1 tablet twice a day by mouth  Patient Instructions: 1)  Your physician recommends that you schedule a follow-up appointment in: 1 year 2)  Your physician has requested that you have an echocardiogram.  Echocardiography is a painless test that uses sound waves to create images of your heart. It provides your doctor with information about the size and shape of your heart and how well your heart's chambers and valves are working.  This procedure takes approximately one hour. There are no restrictions for  this procedure. 3)  Your physician has recommended you make the following change in your medication: Hold Warfarin 10 days prior to surgery.

## 2011-02-08 ENCOUNTER — Other Ambulatory Visit: Payer: Self-pay | Admitting: Cardiovascular Disease

## 2011-02-08 ENCOUNTER — Other Ambulatory Visit (INDEPENDENT_AMBULATORY_CARE_PROVIDER_SITE_OTHER): Payer: Medicare PPO

## 2011-02-08 DIAGNOSIS — R011 Cardiac murmur, unspecified: Secondary | ICD-10-CM

## 2011-02-09 ENCOUNTER — Telehealth: Payer: Self-pay | Admitting: Cardiovascular Disease

## 2011-02-09 ENCOUNTER — Encounter: Payer: Self-pay | Admitting: Cardiovascular Disease

## 2011-02-10 ENCOUNTER — Inpatient Hospital Stay (HOSPITAL_COMMUNITY): Payer: Medicare PPO

## 2011-02-10 ENCOUNTER — Inpatient Hospital Stay (HOSPITAL_COMMUNITY)
Admission: RE | Admit: 2011-02-10 | Discharge: 2011-02-22 | DRG: 471 | Disposition: A | Discharge status: Skilled Nursing Facility | Payer: Medicare PPO | Source: Ambulatory Visit | Attending: Orthopedic Surgery | Admitting: Orthopedic Surgery

## 2011-02-10 DIAGNOSIS — I10 Essential (primary) hypertension: Secondary | ICD-10-CM | POA: Diagnosis present

## 2011-02-10 DIAGNOSIS — J95821 Acute postprocedural respiratory failure: Secondary | ICD-10-CM | POA: Diagnosis not present

## 2011-02-10 DIAGNOSIS — I4949 Other premature depolarization: Secondary | ICD-10-CM | POA: Diagnosis not present

## 2011-02-10 DIAGNOSIS — IMO0002 Reserved for concepts with insufficient information to code with codable children: Secondary | ICD-10-CM | POA: Diagnosis not present

## 2011-02-10 DIAGNOSIS — R1312 Dysphagia, oropharyngeal phase: Secondary | ICD-10-CM | POA: Diagnosis not present

## 2011-02-10 DIAGNOSIS — R0902 Hypoxemia: Secondary | ICD-10-CM | POA: Diagnosis not present

## 2011-02-10 DIAGNOSIS — Y9210 Unspecified residential institution as the place of occurrence of the external cause: Secondary | ICD-10-CM | POA: Diagnosis not present

## 2011-02-10 DIAGNOSIS — I4891 Unspecified atrial fibrillation: Secondary | ICD-10-CM | POA: Diagnosis present

## 2011-02-10 DIAGNOSIS — I498 Other specified cardiac arrhythmias: Secondary | ICD-10-CM | POA: Diagnosis not present

## 2011-02-10 DIAGNOSIS — M4712 Other spondylosis with myelopathy, cervical region: Principal | ICD-10-CM | POA: Diagnosis present

## 2011-02-10 DIAGNOSIS — T17308A Unspecified foreign body in larynx causing other injury, initial encounter: Secondary | ICD-10-CM | POA: Diagnosis not present

## 2011-02-10 HISTORY — PX: CERVICAL FUSION: SHX112

## 2011-02-10 HISTORY — DX: Essential (primary) hypertension: I10

## 2011-02-10 LAB — MRSA PCR SCREENING: MRSA by PCR: NEGATIVE

## 2011-02-11 LAB — CBC
HCT: 34.9 % — ABNORMAL LOW (ref 39.0–52.0)
Hemoglobin: 11.7 g/dL — ABNORMAL LOW (ref 13.0–17.0)
MCV: 95.6 fL (ref 78.0–100.0)
RBC: 3.65 MIL/uL — ABNORMAL LOW (ref 4.22–5.81)
RDW: 12.6 % (ref 11.5–15.5)
WBC: 8.7 10*3/uL (ref 4.0–10.5)

## 2011-02-11 LAB — BASIC METABOLIC PANEL
BUN: 14 mg/dL (ref 6–23)
Chloride: 104 mEq/L (ref 96–112)
GFR calc non Af Amer: >60 mL/min (ref >60)
Glucose, Bld: 106 mg/dL — ABNORMAL HIGH (ref 70–99)
Potassium: 4.2 mEq/L (ref 3.5–5.1)
Sodium: 136 mEq/L (ref 135–145)

## 2011-02-11 NOTE — Op Note (Signed)
NAME:  Eric [name], Eric [name] NO.:  [id number]  MEDICAL RECORD NO.:  [id number]           PATIENT TYPE:  I  LOCATION:  3107                         FACILITY:  [facility]  PHYSICIAN:  Estill Bamberg, MD      DATE OF BIRTH:  04/25/1932  DATE OF PROCEDURE:  02/10/2011 DATE OF DISCHARGE:                              OPERATIVE REPORT   PREOPERATIVE DIAGNOSIS:  Myelopathy.  POSTOPERATIVE DIAGNOSIS:  Myelopathy.  PROCEDURE: 1. Laminectomy with spinal cord decompression C3 to C6. 2. Bilateral posterior laminotomy with partial facetectomy and     foraminotomy C4-5, C6-7. 3. Posterior spinal fusion C2-3, C3-4, C4-5, C5-6, C6-7, C7-T1. 4. Insertion of posterior segmental instrumentation C2 to T1. 5. Use of local autograft. 6. Bone marrow aspiration. 7. Cranial tong application and removal.  SURGEON:  Estill Bamberg, MD  ASSISTANT:  Laural Benes. Su Hilt, [place]  ANESTHESIA:  General endotracheal anesthesia.  ESTIMATED BLOOD LOSS:  450 mL.  COMPLICATIONS:  None.  DISPOSITION:  Stable.  INSTRUMENTATION USED:  DePuy Mountaineer posterior instrumentation with 3.5-mm screws placed at C2, C3, C4, C5, and C7 and 4- x 26-mm screws placed at T1 with a 28-mm cross-link placed across the C5 level.  INDICATIONS FOR PROCEDURE:  Briefly, Mr. Prentiss is a very pleasant 77 year old male who presented to my office initially on December 24, 2010, with deterioration of balance and deterioration in his fine motor skills. The patient states that the deterioration in his function has been ongoing over the course of the past year.  He has actually been having to use a cane over the course of the last year as well.  He states he has come close to falling over multiple times but has not actually fallen over.  Of note, I did review an MRI of the patient's cervical spine dated December 02, 2010.  This was significant for rather impressive spinal cord compression at multiple levels throughout the patient's  spine.  Most notable were the C2-3 levels in addition to the C4-5, C5-6, C6-7, and C7-T1 levels.  Given the multilevel nature of the patient's problem, I did discuss going forward with a posterior cervical laminectomy and fusion from C2 to T1.  Given the rather profound neuroforaminal stenosis identified at multiple levels, I did also discuss going ahead with a prophylactic C4-5 bilateral laminotomy in order to help decompress the sural nerve to help relieve tension on the exiting C5 nerves in order to help minimize the chances of a postoperative nerve palsy.  C6-7 posterior laminotomies were also planned in order to help identify the C7 pedicle to place C7 pedicle screws.  After fully understanding the risks and limitations of the procedure, the patient did agree to go forward.  Of particular note, the patient and his family did understand the goal of surgery was to prevent any additional progression of his current myelopathy and not necessarily to alleviate his current constellation of symptoms.  OPERATIVE DETAILS:  On February 10, 2011, the patient was brought to surgery and general endotracheal anesthesia was administered.  Cranial tongs were placed in the usual manner.  The  patient was placed prone on hospital bed.  The head was translated posteriorly and slightly forward flexion in order to help optimize my exposure.  Of particular note, neurologic monitoring was used throughout the entire procedure.  I did get baselines prior to flipping the patient in order to ensure that there was no malposition of the head that would compromise the patient's spinal cord function.  Motors were then run after the patient was placed into position and they were consistent with baseline.  A time-out procedure was then performed.  Ancef was given for antibiotic prophylaxis.  SCDs were placed.  The posterior neck was then prepped and draped in the usual sterile fashion.  I then made an incision  from approximately the spinous process of C2 to approximately the spinous process of T2.  I then subperiosteally exposed the lamina of C2, C3, C4, C5, C6, C7, and T1.  It should be noted there was rather profound arthritis noted at multiple levels throughout the spine.  There was a subluxation identified across the C4-5 level mainly on the right side. Again, the facet arthrosis rather profound and very significant.  After full exposure was identified, I did use a Bovie to locate the facet joints at each level to be fused.  I then used a 1.7-mm bur in order to decorticate the facet joints of every level to be fused.  At this point, I did use a 1.7-mm bur to gain access to the lateral masses of C3, C4, and C5.  The a trajectory of approximately 30 degrees lateral and 30 degrees superior was used in order to ensure there was no violation of the vertebral artery or the exiting nerve roots.  I then used a 14-mm, 2.5-mm drill.  A ball-tipped probe was used to ensure that there was no cortical violation.  A 3.5-mm tap was then used at the C3, C4, and C5 lateral masses.  This was done bilaterally.  The holes were sealed with bone wax.  I then performed a laminotomy and partial facetectomy and foraminotomy at the C6-7 levels bilaterally.  I did this in order to locate the medial and the superior border of the C7 pedicles.  Once they were identified, the pedicles were cannulated using a 1.7-mm bur followed by a 3-mm tap.  I advanced the tap 26 mm and I did use the ball- tipped probe to ensure that there was no cortical violation.  I also ensured that the exiting C7 nerves were free of pressure.  Again, it should be noted that the facet arthrosis was rather profound and there was extensive compression on the exiting C7 nerves.  I then cannulated the T1 pedicles with a 1.7-mm bur followed by a 3.5-mm tap.  The tap was advanced 26 mm.  Again, a ball-tipped probe was used to confirm there was no  cortical violation.  This was done on both the right and the left sides.  The holes were sealed with bone wax.  Of particular note, I did use a 10 mL syringe and did aspirate approximately 6-7 mL of bone marrow aspirate from the C7 and T1 vertebral body as well cannulating the pedicles.  I then turned my attention towards the C2 lamina.  I did use a Penfield 4 in order to identify the superior and the medial border of the C2 pedicle.  Once this was identified, I used a 1.7-mm bur to gain access to the pedicle and this was followed with a 3-mm tap.  A 3-mm  tap was advanced in the trajectory of the pedicle to a 20-mm depth.  Again, a ball-tipped probe confirmed that there was no cortical violation.  The holes were then sealed with bone wax.  At this point, with all the lateral mass and pedicle screws cannulated, I did turn my attention towards the laminectomy.  Using a 3-mm bur, I did bur away a trough measuring approximately 15 mm on both the right and the left sides at the C3, C4, C5, and C6 lamina.  Once the trough was completed, I did have an assistant holding upward traction on the lamina.  With an assistant holding in upward traction, I did use a nerve hook in addition to various curettes and a 1-mm Kerrison in order to complete the laminectomy.  The C3, C4, C5, and C6 the spinous processes and lamina were removed uneventfully and this did complete the laminectomy.  It should be noted that at the termination of the laminectomy, there was a slight attenuation noted of the dura off to the left side.  It did appear that there was an adhesion between the dura and the lamina posteriorly.  Again, the dura did appear to be attenuated but there was no extravasation of cerebrospinal fluid encountered.  I did, however, make a decision to place a patch of Duragen and I did supplement this with DuraSeal.  Again, no extravasation of cerebrospinal fluid was noted.  I did use a #2 Kerrison in order  to remove ligamentum flavum that remained across the C2-3 interspace and across the C6-7 interspace. Again, I was very happy with the decompression.  Of note, prior to performing the laminectomy, I did perform bilateral laminotomies, partial facetectomies, and foraminotomies at the C4-5 level on both the right and the left sides.  This was a meticulous aspect of the procedure, as there was rather severe and profound arthritis at this particular level and there was significant compression noted at the exiting C5 nerve.  However, I was very happy with the decompression. Additional motor-evoked potentials were run after the laminectomy was completed and there was no abnormal responses noted.  Essentially, the responses were consistent with baseline.  At this point, I copiously irrigated the wound with approximately 1 L of normal saline.  I did use a 3-mm high-speed bur to decorticate the posterior elements associated with C2, C3, C4, C5, C6, C7, and T1.  The transverse processes of C1 were also decorticated.  I then placed pedicle screws at C2, C7, and T1, and I placed lateral mass screws at C3, C4, and C5.  All lateral mass screws were 3.5 x 14 mm.  The C2 screw measured 3.5 x 20 bilaterally, the C7 screws measured 3.5 x 26 mm, and the T1 screw measured 4 x 26 mm. These were all placed uneventfully.  The autograft that was obtained from the decompression aspect of the procedure was mixed with 10 mL aliquot of Vitoss BA in addition to the bone marrow aspirate.  A 50% of this mixture was placed along the posterior elements of C2 through T1 on both the right and the left sides.  I then fashioned a 3.5-mm rod to the appropriate configuration.  The rod was secured on both the right and the left sides.  Caps were placed and then a final tightening procedure was performed.  Of note, a crossing was placed across the construct at the level of C5.  Again, a final tightening procedure was performed  here as well.  I did  get a lateral intraoperative radiograph at this portion of the procedure in order to confirm that the appropriate levels were fused and that the construct did appear to be adequate.  I was very happy with the appearance of the radiograph.  At this point, a deep Blake drain was placed.  The fascia was then closed using #1 Vicryl. The deep subcutaneous layer was closed using 0 Vicryl and the superficial subcutaneous layer was closed using a 2-0 Vicryl.  The skin was closed using 3-0 nylon.  Two 2-0 nylon stitch was used to secure the drain in place.  All instrument counts were correct at the termination of the procedure.  Of note, Dan Humphreys was my assistant throughout the procedure and aided in essential suctioning, retraction, and various decompressive aspects of the procedure.  At the termination of the procedure, the patient was rolled supine and the Mayfield head holder was removed uneventfully.  Per the anesthesia team, the plan was to evaluate the patient's swelling and respiratory status postoperatively and they would make a decision subsequently as to whether to exudate the patient on the day of surgery or subsequently. Also of note, motor-evoked potentials were run again at the time of closure and the motor-evoked potentials were consistent with the patient's baseline.     Estill Bamberg, MD     MD/MEDQ  D:  02/10/2011  T:  02/11/2011  Job:  [id number]  Electronically Signed by Estill Bamberg  on 02/11/2011 08:14:27 AM

## 2011-02-12 ENCOUNTER — Inpatient Hospital Stay (HOSPITAL_COMMUNITY): Payer: Medicare PPO

## 2011-02-13 ENCOUNTER — Inpatient Hospital Stay (HOSPITAL_COMMUNITY): Payer: Medicare PPO

## 2011-02-13 ENCOUNTER — Encounter (HOSPITAL_COMMUNITY): Payer: Self-pay | Admitting: Radiology

## 2011-02-13 LAB — COMPREHENSIVE METABOLIC PANEL
ALT: 33 U/L (ref 0–53)
AST: 41 U/L — ABNORMAL HIGH (ref 0–37)
Alkaline Phosphatase: 34 U/L — ABNORMAL LOW (ref 39–117)
BUN: 14 mg/dL (ref 6–23)
Calcium: 8.2 mg/dL — ABNORMAL LOW (ref 8.4–10.5)
GFR calc Af Amer: >60 mL/min (ref >60)
GFR calc non Af Amer: >60 mL/min (ref >60)
Total Bilirubin: 1.1 mg/dL (ref 0.3–1.2)

## 2011-02-13 LAB — PROTIME-INR
INR: 1.06 (ref 0.00–1.49)
Prothrombin Time: 14 seconds (ref 11.6–15.2)

## 2011-02-13 LAB — BLOOD GAS, ARTERIAL
Drawn by: 277331
TCO2: 26.3 mmol/L (ref 0–100)
pCO2 arterial: 40.1 mmHg (ref 35.0–45.0)
pH, Arterial: 7.412 (ref 7.350–7.450)
pO2, Arterial: 166 mmHg — ABNORMAL HIGH (ref 80.0–100.0)

## 2011-02-13 LAB — CBC
HCT: 37.3 % — ABNORMAL LOW (ref 39.0–52.0)
Platelets: 176 10*3/uL (ref 150–400)
RBC: 3.94 MIL/uL — ABNORMAL LOW (ref 4.22–5.81)
RDW: 12.4 % (ref 11.5–15.5)
WBC: 9 10*3/uL (ref 4.0–10.5)

## 2011-02-13 MED ORDER — IOHEXOL 300 MG/ML  SOLN
500.0000 mL | Freq: Once | INTRAMUSCULAR | Status: AC | PRN
  Administered 2011-02-13: 180 mL via INTRAVENOUS

## 2011-02-13 NOTE — Consult Note (Signed)
NAME:  Eric [name], PAT NO.:  [id number]  MEDICAL RECORD NO.:  [id number]           PATIENT TYPE:  I  LOCATION:  5008                         FACILITY:  [facility]  PHYSICIAN:  Conley Canal, MD      DATE OF BIRTH:  1932-09-21  DATE OF CONSULTATION: DATE OF DISCHARGE:                                CONSULTATION   REQUESTING PHYSICIAN:  Jones Broom, MD  REASON FOR CONSULTATION:  Shortness of breath and hypoxia.  HISTORY OF PRESENT ILLNESS:  I was asked by Dr. Ave Filter to see this pleasant 77 year old male with history of hypertension, who was admitted on February 10, 2011, for cervical fusion C2-T1 done by Dr. Yevette Edwards.  The consult was requested as the patient has been experiencing shortness of breath and some hypoxia.  He says that he has not been feeling well mostly since yesterday.  He has been short of breath, but he has also noticed productive cough since admission.  The cough is productive of clear sputum.  He has had no fever, no hemoptysis and he says that when he takes deep breath, he feels somewhat better.  After the cervical fusion, the patient was noted to have dysphagia resulting in Gantt placement today, as he was noted to have problem swallowing.  Otherwise, he denies pleuritic chest pain.  No leg pains.  He has also been getting IV fluids since surgery.  Otherwise, he reports that he has been healthy before the procedure.  PAST MEDICAL HISTORY:  Hypertension, cervical myelopathy.  ALLERGIES:  No known drug allergies.  FAMILY HISTORY:  The patient reports a negative history for premature coronary artery disease.  CURRENT MEDICATIONS:  Vitamin D3, Colace, Ensure, lactulose, Lopressor, niacin, diazepam, hydroxyzine, morphine sulfate, Percocet.  REVIEW OF SYSTEMS:  Unremarkable except as highlighted in the history of present illness.  SOCIAL HISTORY:  The patient denies cigarette smoking, alcohol, or illicit drugs.  He is married.  PHYSICAL  EXAMINATION:  GENERAL:  On exam, this is an elderly male sitting up in the chair.  He has a neck collar. VITAL SIGNS:  Blood pressure 147/90, heart rate 90, temperature 98.7, respirations 20, oxygen saturation is 94% on 2 liters nasal cannula. HEAD, EARS, NOSE, AND THROAT:  Pupils equal, reacting to light.  Neck collar. RESPIRATORY:  Bilateral air entry with basilar coarse rhonchi.  No wheezing. CARDIOVASCULAR:  First and second heart sounds heard.  No murmurs. Pulses regular. ABDOMEN:  Soft, nontender.  No organomegaly.  Bowel sounds are normal. CNS:  The patient is alert and oriented to person, place, and time with no acute focal neurological deficits. EXTREMITIES:  No pedal edema.  Peripheral pulses are equal.  LABORATORY DATA:  No labs today.  IMAGING STUDIES:  Abdominal x-ray done today showed feeding tube tip positioned at the level of the distal duodenum.  Chest x-ray on February 10, 2011, showed no active cardiopulmonary disease.  IMPRESSION:  A 77 year old male, who recently underwent surgery who has shortness of breath, most likely secondary to aspiration pneumonitis. The other differential diagnoses include pulmonary edema, possibility of pulmonary embolism, very unlikely cardiac  in origin.  RECOMMENDATIONS: 1. Shortness of breath.  Would obtain chest x-ray as you are doing.     CBC, CMP, D-dimer level, ABG.  Meanwhile, agree with monitoring in     Step-down Unit.  We will give Lasix, Lovenox, Zosyn.  If D-dimer     level is positive, would obtain CT of the chest with contrast to     rule out PE.  We will also check proBNP and hold IV fluids. 2. Hypertension.  The patient is on Lopressor, would continue the     same. 3. Status post cervical fusion by Dr. Yevette Edwards. 4. DVT/GI prophylaxis.  Thank you very much for this consult.  We will follow with you during the course of this hospitalization.     Conley Canal, MD     SR/MEDQ  D:  02/13/2011  T:  02/13/2011  Job:   [id number]  cc:   Jones Broom, MD Estill Bamberg, MD  Electronically Signed by Conley Canal  on 02/13/2011 08:27:21 PM

## 2011-02-14 ENCOUNTER — Inpatient Hospital Stay (HOSPITAL_COMMUNITY): Payer: Medicare PPO

## 2011-02-14 DIAGNOSIS — M4712 Other spondylosis with myelopathy, cervical region: Secondary | ICD-10-CM

## 2011-02-14 LAB — BLOOD GAS, ARTERIAL
Acid-Base Excess: 4.6 mmol/L — ABNORMAL HIGH (ref 0.0–2.0)
Drawn by: 290241
O2 Content: 10 L/min
O2 Saturation: 98.9 %
TCO2: 29.7 mmol/L (ref 0–100)
pO2, Arterial: 127 mmHg — ABNORMAL HIGH (ref 80.0–100.0)

## 2011-02-14 LAB — CBC
HCT: 40.5 % (ref 39.0–52.0)
Hemoglobin: 13.5 g/dL (ref 13.0–17.0)
MCV: 94.8 fL (ref 78.0–100.0)
RBC: 4.27 MIL/uL (ref 4.22–5.81)
RDW: 12.7 % (ref 11.5–15.5)
WBC: 10.1 10*3/uL (ref 4.0–10.5)

## 2011-02-14 LAB — COMPREHENSIVE METABOLIC PANEL
ALT: 48 U/L (ref 0–53)
Alkaline Phosphatase: 40 U/L (ref 39–117)
BUN: 26 mg/dL — ABNORMAL HIGH (ref 6–23)
CO2: 28 mEq/L (ref 19–32)
Chloride: 103 mEq/L (ref 96–112)
Glucose, Bld: 137 mg/dL — ABNORMAL HIGH (ref 70–99)
Potassium: 3.8 mEq/L (ref 3.5–5.1)
Total Bilirubin: 1.1 mg/dL (ref 0.3–1.2)

## 2011-02-15 LAB — GLUCOSE, CAPILLARY
Glucose-Capillary: 133 mg/dL — ABNORMAL HIGH (ref 70–99)
Glucose-Capillary: 126 mg/dL — ABNORMAL HIGH (ref 70–99)
Glucose-Capillary: 136 mg/dL — ABNORMAL HIGH (ref 70–99)
Glucose-Capillary: 128 mg/dL — ABNORMAL HIGH (ref 70–99)
Glucose-Capillary: 117 mg/dL — ABNORMAL HIGH (ref 70–99)
Glucose-Capillary: 119 mg/dL — ABNORMAL HIGH (ref 70–99)

## 2011-02-15 LAB — RENAL FUNCTION PANEL
Albumin: 3.3 g/dL — ABNORMAL LOW (ref 3.5–5.2)
CO2: 32 mEq/L (ref 19–32)
Calcium: 8.9 mg/dL (ref 8.4–10.5)
Chloride: 104 mEq/L (ref 96–112)
Creatinine, Ser: 0.95 mg/dL (ref 0.4–1.5)
GFR calc Af Amer: >60 mL/min (ref >60)
GFR calc non Af Amer: >60 mL/min (ref >60)
Sodium: 144 mEq/L (ref 135–145)

## 2011-02-15 LAB — CBC
Hemoglobin: 12.9 g/dL — ABNORMAL LOW (ref 13.0–17.0)
MCHC: 33.2 g/dL (ref 30.0–36.0)
Platelets: 198 10*3/uL (ref 150–400)
RBC: 4 MIL/uL — ABNORMAL LOW (ref 4.22–5.81)

## 2011-02-15 LAB — PREALBUMIN: Prealbumin: 32.4 mg/dL (ref 17.0–34.0)

## 2011-02-16 ENCOUNTER — Inpatient Hospital Stay (HOSPITAL_COMMUNITY): Payer: Medicare PPO

## 2011-02-16 LAB — GLUCOSE, CAPILLARY
Glucose-Capillary: 111 mg/dL — ABNORMAL HIGH (ref 70–99)
Glucose-Capillary: 100 mg/dL — ABNORMAL HIGH (ref 70–99)

## 2011-02-17 ENCOUNTER — Encounter (HOSPITAL_COMMUNITY): Payer: Self-pay | Admitting: Radiology

## 2011-02-17 ENCOUNTER — Inpatient Hospital Stay (HOSPITAL_COMMUNITY): Payer: Medicare PPO

## 2011-02-17 LAB — GLUCOSE, CAPILLARY: Glucose-Capillary: 156 mg/dL — ABNORMAL HIGH (ref 70–99)

## 2011-02-17 LAB — BASIC METABOLIC PANEL
Calcium: 8.6 mg/dL (ref 8.4–10.5)
GFR calc Af Amer: >60 mL/min (ref >60)
GFR calc non Af Amer: >60 mL/min (ref >60)
Potassium: 4.3 mEq/L (ref 3.5–5.1)
Sodium: 143 mEq/L (ref 135–145)

## 2011-02-17 LAB — BLOOD GAS, ARTERIAL
Acid-Base Excess: 5 mmol/L — ABNORMAL HIGH (ref 0.0–2.0)
O2 Saturation: 96.1 %
Patient temperature: 98.6
TCO2: 30.3 mmol/L (ref 0–100)

## 2011-02-17 LAB — CBC
Hemoglobin: 12.9 g/dL — ABNORMAL LOW (ref 13.0–17.0)
MCHC: 33.2 g/dL (ref 30.0–36.0)
RDW: 12.9 % (ref 11.5–15.5)
WBC: 9.6 10*3/uL (ref 4.0–10.5)

## 2011-02-17 NOTE — Progress Notes (Signed)
Summary: ECHO results  Phone Note Other Incoming Call back at [phone]   Caller: Revonda Standard- anesthesia PA Summary of Call: Pt is scheduled for surgery tomorrow.  Pt had ECHO done here yesterday, needs to be read and faxed over to [phone] ASAP.  Without these results they will have to cancel surgery.   Initial call taken by: Cloyde Reams RN,  February 09, 2011 2:34 PM  Follow-up for Phone Call        Dr. Mariah Milling reveiwed echo, overall normal function. Faxed letter that echo normal, pt clear for surgery tomorrow to [phone]. Revonda Standard notified, and will not have official report until tomorrow, she is ok with this. Follow-up by: Lanny Hurst RN,  February 09, 2011 4:27 PM

## 2011-02-17 NOTE — Letter (Signed)
Summary: [employer] at [address]   [place], [place] [address]   Phone: [phone]  Fax: [phone]        February 09, 2011 MRN: [id number]    Eric [name] [address] [place], [place]  [address]    To Whom it [place] concern,  The above named pt's echo done on 02/08/11 is normal. Pt is cleared for surgery.        Sincerely,         Dr. Dossie Arbour

## 2011-02-18 ENCOUNTER — Inpatient Hospital Stay (HOSPITAL_COMMUNITY): Payer: Medicare PPO

## 2011-02-18 LAB — CBC
HCT: 39.1 % (ref 39.0–52.0)
Hemoglobin: 12.6 g/dL — ABNORMAL LOW (ref 13.0–17.0)
MCH: 32.1 pg (ref 26.0–34.0)
MCHC: 32.2 g/dL (ref 30.0–36.0)
MCV: 99.5 fL (ref 78.0–100.0)

## 2011-02-18 LAB — DIFFERENTIAL
Basophils Absolute: 0.1 10*3/uL (ref 0.0–0.1)
Lymphocytes Relative: 17 % (ref 12–46)
Lymphs Abs: 1.7 10*3/uL (ref 0.7–4.0)
Monocytes Absolute: 0.8 10*3/uL (ref 0.1–1.0)
Monocytes Relative: 8 % (ref 3–12)
Neutro Abs: 6.4 10*3/uL (ref 1.7–7.7)

## 2011-02-18 LAB — GLUCOSE, CAPILLARY
Glucose-Capillary: 106 mg/dL — ABNORMAL HIGH (ref 70–99)
Glucose-Capillary: 104 mg/dL — ABNORMAL HIGH (ref 70–99)

## 2011-02-18 LAB — COMPREHENSIVE METABOLIC PANEL
ALT: 51 U/L (ref 0–53)
CO2: 28 mEq/L (ref 19–32)
Calcium: 8.6 mg/dL (ref 8.4–10.5)
GFR calc non Af Amer: >60 mL/min (ref >60)
Glucose, Bld: 116 mg/dL — ABNORMAL HIGH (ref 70–99)
Sodium: 140 mEq/L (ref 135–145)

## 2011-02-19 ENCOUNTER — Encounter: Payer: Self-pay | Admitting: Cardiovascular Disease

## 2011-02-19 LAB — GLUCOSE, CAPILLARY

## 2011-02-21 ENCOUNTER — Telehealth: Payer: Self-pay | Admitting: Cardiovascular Disease

## 2011-03-01 NOTE — Progress Notes (Signed)
Summary: Coumadin question   Phone Note Call from Patient Call back at [phone]   Caller: Daughter Call For: [facility] Summary of Call: pt's daughter called wanting to know if pt needs to go back on coumadin or stay off of it. pt is still in the hospital s/p neck surgery. please advise. Thanks, Huntley Dec Initial call taken by: Lysbeth Galas CMA,  February 21, 2011 10:22 AM  Follow-up for Phone Call        Could hold warfarin but come in for an ekg at some time,  f/u every 6 months, monitor pulse for irregular rhythm      Appended Document: Coumadin question pt's daughter notified and will call for EKG appt and follow-up. Pt will be transferred [facility] for rehab.

## 2011-03-02 ENCOUNTER — Encounter: Payer: Self-pay | Admitting: Family Medicine

## 2011-03-02 DIAGNOSIS — I4891 Unspecified atrial fibrillation: Secondary | ICD-10-CM

## 2011-03-02 DIAGNOSIS — Z7901 Long term (current) use of anticoagulants: Secondary | ICD-10-CM

## 2011-03-02 HISTORY — DX: Long term (current) use of anticoagulants: Z79.01

## 2011-03-02 NOTE — Discharge Summary (Signed)
NAME:  Eric [name], Eric [name] NO.:  [id number]  MEDICAL RECORD NO.:  [id number]           PATIENT TYPE:  I  LOCATION:  5018                         FACILITY:  [facility]  PHYSICIAN:  Estill Bamberg, MD      DATE OF BIRTH:  05-02-1932  DATE OF ADMISSION:  02/10/2011 DATE OF DISCHARGE:                              DISCHARGE SUMMARY   ADMISSION DIAGNOSIS:  Myelopathy.  DISCHARGE DIAGNOSIS:  Myelopathy.  PROCEDURES: 1. Posterior cervical laminectomy at C3-C6. 2. Posterior spinal fusion at C2-T1 with instrumentation. 3. Bilateral posterior laminotomy, partial facetectomy, foraminotomy     at C4-5 and C6-7.  ADMISSION HISTORY:  [name] is a very pleasant 77 year old male who presented to my office with deterioration of balance and deterioration of his fine motor skills.  His symptoms have been ongoing over the course of the last year.  I did review an MRI dated December 02, 2010, which was consistent with rather impressive spinal cord compression at multiple levels throughout the patient's spine.  Given the patient's signs and symptoms associated with myelopathy and the impressive of findings on his MRI, we did discuss going forward with a posterior cervical decompression and fusion.  Specifically, I did discuss removing the lamina at the levels above and going forward with a posterior spinal fusion as noted above.  The patient was therefore admitted on February 10, 2011, for the procedure outlined above.  HOSPITAL COURSE:  On February 10, 2011, the patient was brought to surgery and underwent the procedure noted previously.  The patient tolerated the procedure well and was transferred to recovery in stable condition.  The patient's estimated blood loss was 450 mL.  The patient was transferred to recovery and ultimately to general floor uneventfully.  It should be noted that the patient was noted to have decreased strength in his left deltoid and left biceps immediately following  surgery.  It was discussed with the patient that this was a potential sequela of the posterior cervical fusion.  I did explain to the patient that I did perform a laminotomy bilaterally at C4-5 levels to help prevent the severity of this particular issue and to help optimize the patient's recovery.  Of note, the patient's biceps and deltoid strength on the left side did not improve over the course of the patient's hospital stay.  Also of note, the patient was noted to have rather impressive dysphagia on postop days #1 and #2 and was unable to swallow even liquids.  We therefore did go forward with swallowing study on postop day #3 and the patient essentially failed the swallowing study.  As per the swallowing study, there was a concern for aspiration.  A Panda tube was therefore placed and the patient was ultimately given supplemental feeds through the Panda tube at 260 mL/hour.  The patient did go on to have subsequent swallowing studies and the patient did ultimately go on to improve on the swallowing study and it was ultimately recommended that the patient can be given a pureed diet.  This diet was started on February 19, 2011, and the patient did  tolerate the diet well.  Of note, the patient was given physical therapy throughout his hospital stay.  The patient was weightbearing as tolerated.  Of note, the patient did complain of dizziness when ambulating, but he was actually able to ambulate.  He denied any headaches throughout his hospital stay.  The patient's incision was noted to be clean, dry, and intact throughout his hospital stay and his dressing was removed on postop day #6.  There was no drainage noted from the wound and the wound appeared benign throughout his hospital stay.  Also of note, the patient had 3-4 episodes of paroxysmal hypoxemia.  It was felt that this was secondary to mucus plug.  Dr. Venetia Constable from the Medicine Team was consulted and Dr. Marcene Duos team did follow up  with the patient throughout his hospital stay as well.  Ultimately, a CT of the patient's chest was obtained and it was negative for any acute finding or any pulmonary embolus.  His cardiac function was normal throughout his hospital stay.  The patient was ultimately weaned from oxygen and on the day of his discharge, on February 21, 2011, the patient was noted to be saturating at 95% on room air.  Of note, the patient's pain was minimal throughout his hospital stay.  It was felt the patient was overly sensitive to narcotic medications and all narcotics were discontinued. On the day of discharge, the patient was taking only Ultram for pain. The discharge planner was involved happily through the patient's hospital stay and the patient was ultimately set up for discharge to Rehabilitation Facility on February 21, 2011, postop day #11.  MEDICATIONS ON DISCHARGE: 1. Niacin 500 mg daily. 2. Ambien 5 mg 1 p.o. nightly p.r.n. 3. Metoprolol 100 mg p.o. daily. 4. Colace 100 mg p.o. b.i.d. 5. Ultram 50 mg 1 p.o. q.4-6 h. p.r.n. pain. 6. Protonix 40 mg 1 p.o. daily. 7. Vitamin E 2000 mg p.o. daily.  DISCHARGE INSTRUCTIONS:  The patient will be maintained in his hard cervical collar at all times.  The patient will follow back up in my office in approximately 1 week from the day of his discharge to evaluate his wound and to remove his staples.  The patient will avoid lifting over 10 pounds.  The patient will go forward with a regimen of physical therapy program to help improve the patient's strength and balance.  He will be weightbearing as tolerated.     Estill Bamberg, MD     MD/MEDQ  D:  02/21/2011  T:  02/22/2011  Job:  [id number]  Electronically Signed by Estill Bamberg  on 03/02/2011 08:30:31 AM

## 2011-04-15 ENCOUNTER — Ambulatory Visit (INDEPENDENT_AMBULATORY_CARE_PROVIDER_SITE_OTHER): Payer: Medicare PPO | Admitting: Cardiovascular Disease

## 2011-04-15 ENCOUNTER — Encounter: Payer: Self-pay | Admitting: Cardiovascular Disease

## 2011-04-15 DIAGNOSIS — I10 Essential (primary) hypertension: Secondary | ICD-10-CM

## 2011-04-15 DIAGNOSIS — E785 Hyperlipidemia, unspecified: Secondary | ICD-10-CM

## 2011-04-15 DIAGNOSIS — I4891 Unspecified atrial fibrillation: Secondary | ICD-10-CM

## 2011-04-15 NOTE — Patient Instructions (Signed)
You are doing well. No medication changes were made. Please call us if you have new issues that need to be addressed before your next appt.  We will call you for a follow up Appt. In 6 months  

## 2011-04-15 NOTE — Assessment & Plan Note (Signed)
He has maintained sinus rhythm through the stress of the recent neck surgery and postoperative complications. No indication that he had atrial fibrillation. We will continue to hold his warfarin and monitor him closely. He'll stay on his beta blocker

## 2011-04-15 NOTE — Progress Notes (Signed)
   Patient ID: Dolores Mcgovern Laughter, male    DOB: Jun 12, 1932, 77 y.o.   MRN: [id number]  HPI Comments: Mr. Aldape is a very pleasant 77 year old gentleman with a history of atrial fibrillation who  has been maintaining sinus rhythm, with severe neck disease and stenosis Who underwent  cervical and thoracic laminectomy and fusion with instrumentation At the beginning of March with postoperative complications including aspiration requiring a feeding tube who presents for routine followup.  He reports that he had no atrial fibrillation in the hospital despite the complications. He denies any tachycardia palpitations. No significant shortness of breath. His wife reports that his weight has decreased 24 pounds through his hospital course. Continues to wear the neck collar while driving but otherwise is doing physical therapy and has noticed significant improvement in his range of mobility. He does report mild foot swelling at the end of the day in the ankles. He sits on the couch and does not elevate his legs   His warfarin was stopped prior to the procedure and was not restarted as he has maintained normal sinus rhythm.   Last episode of atrial fibrillation was 5 years ago when he had pneumonia.   EKG shows normal sinus rhythm with rate 79 beats per minute, no significant ST or T wave changes, first degree AV block, LAD      Review of Systems  Constitutional: Negative.   HENT: Positive for neck stiffness.        [Well-healed posterior surgical site on his neck Eyes: Negative.   Respiratory: Negative.   Cardiovascular: Negative.   Gastrointestinal: Negative.   Skin: Negative.   Neurological: Negative.   Hematological: Negative.   Psychiatric/Behavioral: Negative.   [all other systems reviewed and are negative    BP 112/76  Pulse 80  Resp 16  Wt 162 lb (73.483 kg)   Physical Exam  [nursing notereviewed. Constitutional: He is oriented to person, place, and time. He appears well-developed and  well-nourished.  HENT:  Head: Normocephalic.  Nose: Nose normal.  Mouth/Throat: Oropharynx is clear and moist.  Eyes: Conjunctivae are normal. Pupils are equal, round, and reactive to light.  Neck: Normal range of motion. Neck supple. No JVD present.  Cardiovascular: Normal rate, regular rhythm, S1 normal, S2 normal, normal heart sounds and intact distal pulses.  Exam reveals no gallop and no friction rub.   No murmur heard. Pulmonary/Chest: Effort normal and breath sounds normal. No respiratory distress. He has no wheezes. He has no rales. He exhibits no tenderness.  Abdominal: Soft. Bowel sounds are normal. He exhibits no distension. There is no tenderness.  Musculoskeletal: He exhibits no edema and no tenderness.       Decreased range of motion of the neck and upper extremities  Lymphadenopathy:    He has no cervical adenopathy.  Neurological: He is alert and oriented to person, place, and time. Coordination normal.  Skin: Skin is warm and dry. No rash noted. No erythema.  Psychiatric: He has a normal mood and affect. His behavior is normal. Judgment and thought content normal.           Assessment and Plan

## 2011-04-15 NOTE — Assessment & Plan Note (Signed)
Blood pressure is well controlled on today's visit. No changes made to the medications. 

## 2011-04-15 NOTE — Assessment & Plan Note (Signed)
He was previously on a statin. He has had a 24 pound weight loss. We have suggested he have his cholesterol checked when he sees Dr. Hetty Ely.

## 2011-05-04 ENCOUNTER — Ambulatory Visit: Payer: Self-pay | Admitting: Cardiovascular Disease

## 2011-05-12 ENCOUNTER — Encounter: Payer: Self-pay | Admitting: Family Medicine

## 2011-05-18 ENCOUNTER — Ambulatory Visit (INDEPENDENT_AMBULATORY_CARE_PROVIDER_SITE_OTHER): Payer: Medicare PPO | Admitting: Family Medicine

## 2011-05-18 ENCOUNTER — Encounter: Payer: Self-pay | Admitting: Family Medicine

## 2011-05-18 DIAGNOSIS — I10 Essential (primary) hypertension: Secondary | ICD-10-CM

## 2011-05-18 DIAGNOSIS — R609 Edema, unspecified: Secondary | ICD-10-CM | POA: Insufficient documentation

## 2011-05-18 DIAGNOSIS — I4891 Unspecified atrial fibrillation: Secondary | ICD-10-CM

## 2011-05-18 NOTE — Patient Instructions (Addendum)
Return as needed

## 2011-05-18 NOTE — Progress Notes (Signed)
  Subjective:    Patient ID: Eric [name], male    DOB: [birth year], 77 y.o.   MRN: [id number]  HPIPt here as acute appt for swollen legs and feet. He was seen last month at Cardiology w/o mention of this problem. He lost 24 pounds in the hospital/NH stay. He is now back to his usual weight. He had cervical spine repair and now has no pain. He really spends a lot of time sitting in the chair. He really doesn't drink much fluid. He did not have this swelling problem in the hospital or in the rehab NH. He and his wife do not use a salt shaker but they eat a lot of canned foods. We discussed washing what is in the can before cooking or eating. He has not recently felt palpitations. He is feeling stronger from his surgery. His wife thinks he is depressed but he vehemently denies this.    Review of Systems  Constitutional: Negative for fever, chills, diaphoresis, activity change, appetite change and fatigue.  HENT: Negative for hearing loss, ear pain, congestion, sore throat, rhinorrhea, neck pain, neck stiffness, postnasal drip, sinus pressure, tinnitus and ear discharge.   Eyes: Negative for pain, discharge and visual disturbance.  Respiratory: Negative for cough, shortness of breath and wheezing.   Cardiovascular: Negative for chest pain and palpitations.       No SOB w/ exertion  Gastrointestinal:       No heartburn or swallowing problems.  Genitourinary:       No nocturia  Skin:       No itching or dryness.  Neurological:       No tingling or balance problems.  Psychiatric/Behavioral: Negative for sleep disturbance, dysphoric mood and agitation.  All other systems reviewed and are negative.       Objective:   Physical Exam  Skin: Skin is warm and dry.       No swelling of feet or legs noted.          Assessment & Plan:

## 2011-05-18 NOTE — Assessment & Plan Note (Signed)
Well controlled. Cont curr therapy. BP Readings from Last 3 Encounters:  05/18/11 126/78  04/15/11 112/76  01/24/11 136/81

## 2011-05-18 NOTE — Assessment & Plan Note (Signed)
Well controlled rate which sounds NS at present.

## 2011-05-18 NOTE — Assessment & Plan Note (Signed)
Discussed avoiding salt and frequent activity with legs. If sitting, have feet above the level of the heart.  Discussed needing support hose if continues and my desire to avoid diuretics if poss.

## 2011-06-14 ENCOUNTER — Telehealth: Payer: Self-pay | Admitting: *Deleted

## 2011-06-14 NOTE — Telephone Encounter (Signed)
Left message on machine to call back  

## 2011-06-14 NOTE — Telephone Encounter (Signed)
I called pt and LMOVM.  Please call pt later today and check on his allergies listed.  He has a valium allergy listed in epic- see what the reaction was (I was going to rx xanax o/w).  Please get an update from the patient and let me know.  Thanks.

## 2011-06-14 NOTE — Telephone Encounter (Signed)
Pt's sister-in-law calling requesting pt have something called in for anxiety/nerves. His brother just passed away over the weekend. Pt was seen here in office 04/2011 by Dr. Mariah Milling. I have advised that pt first talk to his PCP which is Dr. Hetty Ely at [facility]. She states she will do this.

## 2011-06-14 NOTE — Telephone Encounter (Signed)
Patient's brother passed away and is asking if can get something for his nerves called in to [facility] [address]

## 2011-06-16 ENCOUNTER — Other Ambulatory Visit: Payer: Self-pay

## 2011-06-16 MED ORDER — CITALOPRAM HYDROBROMIDE 10 MG PO TABS: 10.0000 mg | ORAL_TABLET | Freq: Every day | ORAL | Status: DC

## 2011-06-16 NOTE — Telephone Encounter (Signed)
I talked to pt.  He is having some anxiety.  He can't take BZD due to prev valium intolerance.  I talked with Dr. Hetty Ely and we agreed that celexa Fails help the patient.  Please call pt at [phone] if you can't get him at the home number.  I would take 10mg  a day, every day and then f/u with either me or Dr. Hetty Ely if not improving.  Notify pt that this med [place] take a period of time to work.  If he has concerns or worsening of mood, then notify the clinic.  Please call in celexa 10mg  1 po qday #30, 5rf.  Thanks.

## 2011-06-16 NOTE — Telephone Encounter (Signed)
Patient notified as instructed by telephone. Medication sent electronically to [facility] [employer].

## 2011-06-16 NOTE — Telephone Encounter (Signed)
Patient notified as instructed by telephone. Medication sent electronically  To [facility]. pharmacy as instructed.

## 2011-07-14 ENCOUNTER — Other Ambulatory Visit: Payer: Self-pay | Admitting: Internal Medicine

## 2011-07-14 DIAGNOSIS — R609 Edema, unspecified: Secondary | ICD-10-CM

## 2011-07-15 ENCOUNTER — Ambulatory Visit
Admission: RE | Admit: 2011-07-15 | Discharge: 2011-07-15 | Disposition: A | Discharge status: Home / Self Care | Payer: Medicare PPO | Source: Ambulatory Visit | Attending: Internal Medicine | Admitting: Internal Medicine

## 2011-07-15 DIAGNOSIS — R609 Edema, unspecified: Secondary | ICD-10-CM

## 2011-08-18 ENCOUNTER — Encounter: Payer: Self-pay | Admitting: Internal Medicine

## 2011-08-18 ENCOUNTER — Ambulatory Visit (AMBULATORY_SURGERY_CENTER): Payer: Medicare PPO | Admitting: *Deleted

## 2011-08-18 VITALS — Ht 64.0 in | Wt 172.0 lb

## 2011-08-18 DIAGNOSIS — Z1211 Encounter for screening for malignant neoplasm of colon: Secondary | ICD-10-CM

## 2011-08-18 MED ORDER — SUPREP BOWEL PREP KIT 17.5-3.13-1.6 GM/177ML PO SOLN: 1.0000 | Freq: Once | ORAL | Status: DC

## 2011-08-30 ENCOUNTER — Encounter: Payer: Self-pay | Admitting: Internal Medicine

## 2011-08-30 ENCOUNTER — Ambulatory Visit (AMBULATORY_SURGERY_CENTER): Payer: Medicare PPO | Admitting: Internal Medicine

## 2011-08-30 VITALS — BP 137/84 | HR 91 | Resp 18 | Ht 64.0 in | Wt 172.0 lb

## 2011-08-30 DIAGNOSIS — D126 Benign neoplasm of colon, unspecified: Secondary | ICD-10-CM

## 2011-08-30 DIAGNOSIS — Z1211 Encounter for screening for malignant neoplasm of colon: Secondary | ICD-10-CM

## 2011-08-30 MED ORDER — SODIUM CHLORIDE 0.9 % IV SOLN: 500.0000 mL | INTRAVENOUS | Status: DC

## 2011-08-30 NOTE — Progress Notes (Signed)
From 8:10 until 11:00 [employer] computer system wide down. After 11:00 wireless network down rest of day. Down time procedure policy followed.  Information entered by Laverna Peace RN. Procedure room RN Durwin Glaze, and Recovery Room RN [facility].

## 2011-08-31 ENCOUNTER — Telehealth: Payer: Self-pay

## 2011-08-31 NOTE — Telephone Encounter (Signed)

## 2011-09-01 ENCOUNTER — Other Ambulatory Visit: Payer: Medicare PPO | Admitting: Internal Medicine

## 2011-09-06 ENCOUNTER — Encounter: Payer: Self-pay | Admitting: Internal Medicine

## 2011-11-09 ENCOUNTER — Encounter: Payer: Self-pay | Admitting: Cardiovascular Disease

## 2011-11-09 ENCOUNTER — Ambulatory Visit (INDEPENDENT_AMBULATORY_CARE_PROVIDER_SITE_OTHER): Payer: Medicare PPO | Admitting: Cardiovascular Disease

## 2011-11-09 DIAGNOSIS — R0602 Shortness of breath: Secondary | ICD-10-CM

## 2011-11-09 DIAGNOSIS — R42 Dizziness and giddiness: Secondary | ICD-10-CM

## 2011-11-09 DIAGNOSIS — I4891 Unspecified atrial fibrillation: Secondary | ICD-10-CM

## 2011-11-09 DIAGNOSIS — E785 Hyperlipidemia, unspecified: Secondary | ICD-10-CM

## 2011-11-09 DIAGNOSIS — I10 Essential (primary) hypertension: Secondary | ICD-10-CM

## 2011-11-09 MED ORDER — METOPROLOL TARTRATE 100 MG PO TABS: 100.0000 mg | ORAL_TABLET | Freq: Two times a day (BID) | ORAL | Status: DC

## 2011-11-09 NOTE — Progress Notes (Signed)
Patient ID: Eric [name], male    DOB: 07/05/1932, 77 y.o.   MRN: [id number]  HPI Comments: [name] is a very pleasant 77 year old gentleman with a history of atrial fibrillation who  has been maintaining sinus rhythm, with severe neck disease and stenosis Who underwent  cervical and thoracic laminectomy and fusion with instrumentation At the beginning of March with postoperative complications including aspiration requiring a feeding tube who presents for routine followup.  He denies any significant palpitations or tachycardia Concerning for atrial fibrillation. 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 It lasted for several seconds and he had to hold onto the wall. He has not had additional episodes prior to this. Otherwise he has been feeling well. He does take his metoprolol  Later in the morning after feeding his farm animals.   EKG shows normal sinus rhythm with rate 102 beats per minute, no significant ST or T wave changes, first degree AV block, LAD, PVCs noted    Outpatient Encounter Prescriptions as of 11/09/2011  Medication Sig Dispense Refill  . acetaminophen (TYLENOL) 650 MG CR tablet Take 1,300 mg by mouth daily.        . Cholecalciferol (VITAMIN D) 1000 UNITS capsule Take 1,000 Units by mouth daily.        . citalopram (CELEXA) 10 MG tablet Take 1 tablet (10 mg total) by mouth daily.  30 tablet  5  . fenofibrate (TRICOR) 145 MG tablet Take 145 mg by mouth daily.        . metoprolol (LOPRESSOR) 100 MG tablet Take 1 tablet (100 mg total) by mouth 2 (two) times daily.  180 tablet  4  . Pediatric Vitamins (HONEY BEARS PO) Take 15 mLs by mouth daily.          Review of Systems  Constitutional: Negative.   HENT: Positive for neck stiffness.        Well-healed posterior surgical site on his neck  Eyes: Negative.   Respiratory: Negative.   Cardiovascular: Negative.   Gastrointestinal: Negative.   Skin: Negative.   Neurological:  Positive for dizziness.  Hematological: Negative.   Psychiatric/Behavioral: Negative.   All other systems reviewed and are negative.    BP 136/62  Ht 5\' 6"  (1.676 m)  Wt 175 lb (79.379 kg)  BMI 28.25 kg/m2   Physical Exam  Nursing note and vitals reviewed. Constitutional: He is oriented to person, place, and time. He appears well-developed and well-nourished.  HENT:  Head: Normocephalic.  Nose: Nose normal.  Mouth/Throat: Oropharynx is clear and moist.  Eyes: Conjunctivae are normal. Pupils are equal, round, and reactive to light.  Neck: Normal range of motion. Neck supple. No JVD present.  Cardiovascular: Normal rate, regular rhythm, S1 normal, S2 normal, normal heart sounds and intact distal pulses.  Exam reveals no gallop and no friction rub.   No murmur heard. Pulmonary/Chest: Effort normal and breath sounds normal. No respiratory distress. He has no wheezes. He has no rales. He exhibits no tenderness.  Abdominal: Soft. Bowel sounds are normal. He exhibits no distension. There is no tenderness.  Musculoskeletal: He exhibits no edema and no tenderness.       Decreased range of motion of the neck and upper extremities  Lymphadenopathy:    He has no cervical adenopathy.  Neurological: He is alert and oriented to person, place, and time. Coordination normal.  Skin: Skin is warm and dry. No rash noted. No erythema.  Psychiatric: He has a normal mood and affect. His behavior is normal. Judgment and thought content normal.           Assessment and Plan

## 2011-11-09 NOTE — Assessment & Plan Note (Signed)
On tricor. We do not have recent lab work.

## 2011-11-09 NOTE — Assessment & Plan Note (Signed)
No evidence of atrial fibrillation. We'll continue metoprolol tartrate b.i.d.. 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

## 2011-11-09 NOTE — Patient Instructions (Signed)
You are doing well. No medication changes were made. Use a little more salt in your diet as needed for low blood pressure.   Please call us if you have new issues that need to be addressed before your next appt.  The office will contact you for a follow up Appt. In 6 months

## 2011-11-09 NOTE — Assessment & Plan Note (Signed)
Blood pressure appears adequate today. He does not have a blood pressure cuff at home

## 2011-11-09 NOTE — Assessment & Plan Note (Signed)
Severe dizzy episode this morning lasting several seconds. Uncertain if this was secondary to arrhythmia or hypotension. I've asked him to increase his fluid intake, liberalize his salt intake, and take his metoprolol first thing in the morning.

## 2011-11-24 ENCOUNTER — Other Ambulatory Visit: Payer: Self-pay | Admitting: Internal Medicine

## 2011-11-24 DIAGNOSIS — IMO0001 Reserved for inherently not codable concepts without codable children: Secondary | ICD-10-CM

## 2011-11-28 ENCOUNTER — Ambulatory Visit
Admission: RE | Admit: 2011-11-28 | Discharge: 2011-11-28 | Disposition: A | Discharge status: Home / Self Care | Payer: Medicare PPO | Source: Ambulatory Visit | Attending: Internal Medicine | Admitting: Internal Medicine

## 2011-11-28 DIAGNOSIS — IMO0001 Reserved for inherently not codable concepts without codable children: Secondary | ICD-10-CM

## 2012-02-14 ENCOUNTER — Other Ambulatory Visit: Payer: Self-pay | Admitting: Dermatology

## 2012-05-16 ENCOUNTER — Ambulatory Visit (INDEPENDENT_AMBULATORY_CARE_PROVIDER_SITE_OTHER): Payer: Medicare PPO | Admitting: Cardiovascular Disease

## 2012-05-16 ENCOUNTER — Encounter: Payer: Self-pay | Admitting: Cardiovascular Disease

## 2012-05-16 VITALS — BP 124/76 | HR 90 | Ht 67.0 in | Wt 169.0 lb

## 2012-05-16 DIAGNOSIS — E785 Hyperlipidemia, unspecified: Secondary | ICD-10-CM

## 2012-05-16 DIAGNOSIS — I4891 Unspecified atrial fibrillation: Secondary | ICD-10-CM

## 2012-05-16 DIAGNOSIS — R079 Chest pain, unspecified: Secondary | ICD-10-CM

## 2012-05-16 DIAGNOSIS — I10 Essential (primary) hypertension: Secondary | ICD-10-CM

## 2012-05-16 MED ORDER — METOPROLOL TARTRATE 100 MG PO TABS: 100.0000 mg | ORAL_TABLET | Freq: Two times a day (BID) | ORAL | Status: DC

## 2012-05-16 NOTE — Assessment & Plan Note (Signed)
Maintaining normal sinus rhythm. Continue current dose of metoprolol. Palpitations are likely secondary to ectopy.

## 2012-05-16 NOTE — Assessment & Plan Note (Signed)
Blood pressure is well controlled on today's visit. No changes made to the medications. 

## 2012-05-16 NOTE — Patient Instructions (Signed)
You are doing well. No medication changes were made.  Please call us if you have new issues that need to be addressed before your next appt.  Your physician wants you to follow-up in: 6 months.  You will receive a reminder letter in the mail two months in advance. If you don't receive a letter, please call our office to schedule the follow-up appointment.   

## 2012-05-16 NOTE — Progress Notes (Signed)
Patient ID: Eric [name], male    DOB: 04/17/1932, 78 y.o.   MRN: [id number]  HPI Comments: [name] is a very pleasant 78 year old gentleman with a history of atrial fibrillation who  has been maintaining sinus rhythm, with severe neck disease and stenosis who underwent  cervical and thoracic laminectomy and fusion with instrumentation At the beginning of March with postoperative complications including aspiration requiring a feeding tube who presents for routine followup.  He reports having rare palpitations described as a fluttering. This lasts for several seconds. He is active but does have chronic back pain. He is mowing using a self propelled mower. He denies any significant palpitations or tachycardia Concerning for atrial fibrillation. 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 He does report an episode where he was walking to the mailbox without his cane, legs were wobbly and he fell and hit his head.   EKG shows normal sinus rhythm with rate 90 beats per minute, no significant ST or T wave changes, first degree AV block, LAD, PVCs noted    Outpatient Encounter Prescriptions as of 05/16/2012  Medication Sig Dispense Refill  . Cholecalciferol (VITAMIN D) 1000 UNITS capsule Take 1,000 Units by mouth daily.        . fenofibrate (TRICOR) 145 MG tablet Take 145 mg by mouth daily.        . metoprolol (LOPRESSOR) 100 MG tablet Take 1 tablet (100 mg total) by mouth 2 (two) times daily.  180 tablet  3  . DISCONTD: metoprolol (LOPRESSOR) 100 MG tablet Take 1 tablet (100 mg total) by mouth 2 (two) times daily.  180 tablet  4  . citalopram (CELEXA) 10 MG tablet Take 1 tablet (10 mg total) by mouth daily.  30 tablet  5  . DISCONTD: acetaminophen (TYLENOL) 650 MG CR tablet Take 1,300 mg by mouth daily.        Marland Kitchen DISCONTD: Pediatric Vitamins (HONEY BEARS PO) Take 15 mLs by mouth daily.        Review of Systems  Constitutional: Negative.   HENT: Positive for neck stiffness.        Well-healed  posterior surgical site on his neck  Eyes: Negative.   Respiratory: Negative.   Cardiovascular: Negative.   Gastrointestinal: Negative.   Musculoskeletal: Positive for back pain.  Skin: Negative.   Hematological: Negative.   Psychiatric/Behavioral: Negative.   All other systems reviewed and are negative.    BP 124/76  Pulse 90  Ht 5\' 7"  (1.702 m)  Wt 169 lb (76.658 kg)  BMI 26.47 kg/m2  Physical Exam  Nursing note and vitals reviewed. Constitutional: He is oriented to person, place, and time. He appears well-developed and well-nourished.  HENT:  Head: Normocephalic.  Nose: Nose normal.  Mouth/Throat: Oropharynx is clear and moist.  Eyes: Conjunctivae are normal. Pupils are equal, round, and reactive to light.  Neck: Normal range of motion. Neck supple. No JVD present.  Cardiovascular: Normal rate, regular rhythm, S1 normal, S2 normal, normal heart sounds and intact distal pulses.  Exam reveals no gallop and no friction rub.   No murmur heard. Pulmonary/Chest: Effort normal and breath sounds normal. No respiratory distress. He has no wheezes. He has no rales. He exhibits no tenderness.  Abdominal: Soft. Bowel sounds are normal. He exhibits no distension. There is no tenderness.  Musculoskeletal: Normal range of motion. He exhibits no edema and no tenderness.       Decreased range of motion of the neck and  upper extremities  Lymphadenopathy:    He has no cervical adenopathy.  Neurological: He is alert and oriented to person, place, and time. Coordination normal.  Skin: Skin is warm and dry. No rash noted. No erythema.  Psychiatric: He has a normal mood and affect. His behavior is normal. Judgment and thought content normal.           Assessment and Plan

## 2012-05-16 NOTE — Assessment & Plan Note (Signed)
We have asked him to followup with his primary care physician for routine blood work. No known coronary artery disease. No significant smoking history or diabetes.

## 2012-05-25 ENCOUNTER — Other Ambulatory Visit: Payer: Self-pay | Admitting: Internal Medicine

## 2012-05-25 ENCOUNTER — Ambulatory Visit
Admission: RE | Admit: 2012-05-25 | Discharge: 2012-05-25 | Disposition: A | Discharge status: Home / Self Care | Payer: Medicare PPO | Source: Ambulatory Visit | Attending: Internal Medicine | Admitting: Internal Medicine

## 2012-05-25 DIAGNOSIS — W19XXXA Unspecified fall, initial encounter: Secondary | ICD-10-CM

## 2012-06-29 ENCOUNTER — Other Ambulatory Visit: Payer: Self-pay | Admitting: Orthopedic Surgery

## 2012-06-29 DIAGNOSIS — R531 Weakness: Secondary | ICD-10-CM

## 2012-06-29 DIAGNOSIS — R200 Anesthesia of skin: Secondary | ICD-10-CM

## 2012-07-02 ENCOUNTER — Ambulatory Visit
Admission: RE | Admit: 2012-07-02 | Discharge: 2012-07-02 | Disposition: A | Discharge status: Home / Self Care | Payer: Medicare PPO | Source: Ambulatory Visit | Attending: Orthopedic Surgery | Admitting: Orthopedic Surgery

## 2012-07-02 DIAGNOSIS — R531 Weakness: Secondary | ICD-10-CM

## 2012-07-02 DIAGNOSIS — R200 Anesthesia of skin: Secondary | ICD-10-CM

## 2012-09-18 ENCOUNTER — Other Ambulatory Visit: Payer: Self-pay | Admitting: Dermatology

## 2013-01-21 ENCOUNTER — Ambulatory Visit
Admission: RE | Admit: 2013-01-21 | Discharge: 2013-01-21 | Disposition: A | Discharge status: Home / Self Care | Payer: Medicare PPO | Source: Ambulatory Visit | Attending: Internal Medicine | Admitting: Internal Medicine

## 2013-01-21 ENCOUNTER — Other Ambulatory Visit: Payer: Self-pay | Admitting: Internal Medicine

## 2013-01-21 ENCOUNTER — Other Ambulatory Visit (HOSPITAL_BASED_OUTPATIENT_CLINIC_OR_DEPARTMENT_OTHER): Payer: Self-pay | Admitting: Internal Medicine

## 2013-01-21 DIAGNOSIS — R2689 Other abnormalities of gait and mobility: Secondary | ICD-10-CM

## 2013-02-06 ENCOUNTER — Encounter: Payer: Self-pay | Admitting: Nurse Practitioner

## 2013-02-06 ENCOUNTER — Ambulatory Visit (INDEPENDENT_AMBULATORY_CARE_PROVIDER_SITE_OTHER): Payer: Medicare PPO | Admitting: Nurse Practitioner

## 2013-02-06 VITALS — BP 125/86 | HR 78 | Ht 67.0 in | Wt 168.2 lb

## 2013-02-06 DIAGNOSIS — I4891 Unspecified atrial fibrillation: Secondary | ICD-10-CM

## 2013-02-06 DIAGNOSIS — I10 Essential (primary) hypertension: Secondary | ICD-10-CM

## 2013-02-06 DIAGNOSIS — M5030 Other cervical disc degeneration, unspecified cervical region: Secondary | ICD-10-CM

## 2013-02-06 DIAGNOSIS — I480 Paroxysmal atrial fibrillation: Secondary | ICD-10-CM

## 2013-02-06 NOTE — Progress Notes (Signed)
Patient Name: Eric [name] Date of Encounter: 02/06/2013  Primary Care Provider:  Bufford Spikes, DO Primary Cardiologist:  Concha Se, MD  Patient Profile  A 79 year old male with history of paroxysmal atrial fibrillation who presents after being told he had recurrent A. fib.  Problem List   Past Medical History  Diagnosis Date  . Hypertension   . DVT (deep venous thrombosis)   . PAF (paroxysmal atrial fibrillation)     a. 01/2011 Echo: EF 50-55%, Gr 1 DD, Mild AI/MR/TR.  Marland Kitchen Pneumonia 03/06/2003  . Arthritis   . Anxiety   . DDD (degenerative disc disease), cervical    Past Surgical History  Procedure Laterality Date  . History of abd ultrasound  11/01    fatty liver  . Cervical fusion  02/10/2011   Allergies  Allergies  Allergen Reactions  . Morphine And Related Shortness Of Breath  . Percocet (Oxycodone-Acetaminophen) Shortness Of Breath  . Valium Shortness Of Breath   HPI  79 year old male with the above problem list. He has chronic issues with neck pain and more recently right lower leg pain and weakness. He is followed closely by orthopedics and last Thursday was scheduled to have an injection in his lower back. This was to be done under anesthesia as patient was unable to tolerate it without anesthesia. He presented to a surgical Center in [place] where he apparently was found to have an arrhythmia, possibly atrial fibrillation. He was told by anesthesia staff that his heart rate was all over the place and that he would require followup with cardiology prior to his procedure being performed. He was asymptomatic at the time and has had no recent episodes of palpitations, chest pain, dyspnea, presyncope, or syncope. Of note, he was not taking his beta blocker as prescribed up until this event last week. Today, he is in sinus rhythm. Rhythm strips and/or ECG are not available from his surgical visit.  Home Medications  Prior to Admission medications   Medication Sig Start  Date End Date Taking? Authorizing Provider  Cholecalciferol (VITAMIN D) 1000 UNITS capsule Take 1,000 Units by mouth daily.   Yes Historical Provider, MD  fenofibrate (TRICOR) 145 MG tablet Take 145 mg by mouth daily.   Yes Historical Provider, MD  gabapentin (NEURONTIN) 300 MG capsule Take 300 mg by mouth 3 (three) times daily.  01/23/13  Yes Historical Provider, MD  metoprolol (LOPRESSOR) 100 MG tablet Take 100 mg by mouth 2 (two) times daily.   Yes Historical Provider, MD  traMADol (ULTRAM) 50 MG tablet Take 50 mg by mouth 2 (two) times daily as needed.  01/16/13  Yes Historical Provider, MD    Review of Systems  As above, with the exception of chronic neck pain and right lower extremity pain and weakness, he has been doing well from a cardiac standpoint and denies chest pain, dyspnea, PND, orthopnea, dizziness, syncope, edema, or early satiety.  All other systems reviewed and are otherwise negative except as noted above.  Physical Exam  Blood pressure 125/86, pulse 78, height 5\' 7"  (1.702 m), weight 168 lb 4 oz (76.318 kg).  General: Pleasant, NAD Psych: Normal affect. Neuro: Alert and oriented X 3. Moves all extremities spontaneously. HEENT: Normal  Neck: Supple without bruits or JVD. Lungs:  Resp regular and unlabored, CTA. Heart: RRR no s3, s4, or murmurs. Abdomen: Soft, non-tender, non-distended, BS + x 4.  Extremities: No clubbing, cyanosis or edema. DP/PT/Radials 1 + and equal bilaterally.  Accessory Clinical Findings  ECG - regular sinus rhythm, 78, first degree AV block, no acute ST or T changes.  Assessment & Plan  1.  paroxysmal atrial fibrillation: Patient presents today for followup for possible arrhythmia noted at the surgical Center. He is in sinus rhythm with a first-degree AV block today. We have no records from the surgical Center or from orthopedics office and apparently rhythm strips not available. Of note, he says he was often not taking his beta blocker prior to  this event and is now taking it twice a day as prescribed. We'll place a 21 day event monitor to look for silent atrial fibrillation as of this is occurring, we would wish to place the patient on oral anticoagulation. Will also check a basic metabolic profile and a TSH today.  2. Degenerative disc disease: Patient is pending injection in his lower back. He has not been having any chest pain or dyspnea and has no prior history of coronary artery disease. Although he'll be wearing a monitor to look for occult atrial fibrillation, he [place] undergo orthopedic injection without further evaluation at this time.  3. Hypertension: Stable. Next  4. Disposition: Followup event monitor and lab work with office followup in 4-6 weeks.  Nicolasa Ducking, NP 02/06/2013, 4:08 PM

## 2013-02-06 NOTE — Patient Instructions (Addendum)
Your physician wants you to follow-up in: 1 month with Dr. Mariah Milling. You will receive a reminder letter in the mail two months in advance. If you don't receive a letter, please call our office to schedule the follow-up appointment.  Your physician has recommended that you wear an event monitor. Event monitors are medical devices that record the heart's electrical activity. Doctors most often [place] these monitors to diagnose arrhythmias. Arrhythmias are problems with the speed or rhythm of the heartbeat. The monitor is a small, portable device. You can wear one while you do your normal daily activities. This is usually used to diagnose what is causing palpitations/syncope (passing out).- this will be mailed to your home.  We will send a letter to Dr. Francine Graven to clear you for procedure

## 2013-02-07 LAB — BASIC METABOLIC PANEL
BUN/Creatinine Ratio: 22 (ref 10–22)
BUN: 20 mg/dL (ref 8–27)
Creatinine, Ser: 0.9 mg/dL (ref 0.76–1.27)
GFR calc Af Amer: 94 mL/min/{1.73_m2} (ref >59)
GFR calc non Af Amer: 82 mL/min/{1.73_m2} (ref >59)
Glucose: 84 mg/dL (ref 65–99)

## 2013-02-07 LAB — TSH: TSH: 2.24 u[IU]/mL (ref 0.450–4.500)

## 2013-02-20 DIAGNOSIS — I4891 Unspecified atrial fibrillation: Secondary | ICD-10-CM

## 2013-02-22 ENCOUNTER — Ambulatory Visit: Payer: Medicare PPO | Admitting: Cardiovascular Disease

## 2013-02-28 ENCOUNTER — Telehealth: Payer: Self-pay | Admitting: *Deleted

## 2013-02-28 NOTE — Telephone Encounter (Signed)
Patient stated that home health nurse took his blood pressure today and it was 100/60 and yesterday it was 97/60 and he was feeling Dizzy. I spoke with Dr. Renato Gails and she stated to decrease his Metoprolol 100mg  to 1/2 tablet twice daily and monitor blood pressure. Patient Notified and Agreed.

## 2013-02-28 NOTE — Telephone Encounter (Signed)
Amy called and wanted to request occupational therapy for 1 time a week for one week followed by two times a week for three weeks. I told her that was fine

## 2013-03-06 ENCOUNTER — Telehealth: Payer: Self-pay

## 2013-03-06 NOTE — Telephone Encounter (Signed)
reviewed EM tracings with Alinda Money, [place], who says it shows PVCs and occasional 3 beat runs of PVCs Ok to keep appt for tomm

## 2013-03-06 NOTE — Telephone Encounter (Signed)
I received t/c from [facility] saying pt is showing an arrythmia on event monitor They faxed tracings I called pt to assess He is asymptomatic, denies dizziness, lightheadedness, or other symptoms He has appt with [place] tomm  He will keep this appt and I will review EM with Dr. Mariah Milling

## 2013-03-07 ENCOUNTER — Ambulatory Visit (INDEPENDENT_AMBULATORY_CARE_PROVIDER_SITE_OTHER): Payer: Medicare PPO | Admitting: Cardiovascular Disease

## 2013-03-07 ENCOUNTER — Encounter: Payer: Self-pay | Admitting: Cardiovascular Disease

## 2013-03-07 VITALS — BP 100/46 | HR 73 | Resp 16 | Ht 67.0 in | Wt 168.5 lb

## 2013-03-07 DIAGNOSIS — R42 Dizziness and giddiness: Secondary | ICD-10-CM

## 2013-03-07 DIAGNOSIS — I10 Essential (primary) hypertension: Secondary | ICD-10-CM

## 2013-03-07 DIAGNOSIS — E785 Hyperlipidemia, unspecified: Secondary | ICD-10-CM

## 2013-03-07 DIAGNOSIS — I4891 Unspecified atrial fibrillation: Secondary | ICD-10-CM

## 2013-03-07 DIAGNOSIS — R002 Palpitations: Secondary | ICD-10-CM

## 2013-03-07 NOTE — Assessment & Plan Note (Signed)
We'll continue fenofibrate.

## 2013-03-07 NOTE — Patient Instructions (Addendum)
You are doing well. No medication changes were made.  Increase your salt and fluid intake If you continue to have dizziness, call the office We would start a medication to push your blood pressure upwards for dizziness  Please call us if you have new issues that need to be addressed before your next appt.  Your physician wants you to follow-up in: 3 months.  You will receive a reminder letter in the mail two months in advance. If you don't receive a letter, please call our office to schedule the follow-up appointment.

## 2013-03-07 NOTE — Progress Notes (Signed)
Patient ID: Eric [name], male    DOB: 1931-12-23, 79 y.o.   MRN: [id number]  HPI Comments: Mr. Carolan is a very pleasant 79 year old gentleman with a history of atrial fibrillation who  has been maintaining sinus rhythm, with severe neck disease and stenosis who underwent  cervical and thoracic laminectomy and fusion with instrumentation At the beginning of March with postoperative complications including aspiration requiring a feeding tube who presents for routine followup.  He reports having rare palpitations described as a fluttering. This lasts for several seconds. Anesthesia was unable to proceed with cortisone shot for his back as they were concerned about his erratic heart rhythm. In followup in our clinic, 30 day Holter monitor was ordered.This has shown frequent PVCs, occasional couplets, triplets. Very rare episodes of dizziness.  Blood pressure has been running very low. Recent weight loss, also on pain medication for his back. Significant pain in the right legAs well as restless leg at nighttime.  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    EKG shows normal sinus rhythm with rate 73 beats per minute, no significant ST or T wave changes, first degree AV block, LAD    Outpatient Encounter Prescriptions as of 03/07/2013  Medication Sig Dispense Refill  . Cholecalciferol (VITAMIN D) 1000 UNITS capsule Take 1,000 Units by mouth daily.      . fenofibrate (TRICOR) 145 MG tablet Take 145 mg by mouth daily.      Marland Kitchen gabapentin (NEURONTIN) 300 MG capsule Take 300 mg by mouth 3 (three) times daily.       . metoprolol (LOPRESSOR) 100 MG tablet Take 50 mg by mouth 2 (two) times daily.       . traMADol (ULTRAM) 50 MG tablet Take 50 mg by mouth 2 (two) times daily as needed.        No facility-administered encounter medications on file as of 03/07/2013.    Review of Systems  Constitutional: Negative.   HENT: Positive for neck stiffness.        Well-healed posterior surgical site on  his neck  Eyes: Negative.   Respiratory: Negative.   Cardiovascular: Positive for palpitations.  Gastrointestinal: Negative.   Musculoskeletal: Positive for back pain.  Skin: Negative.   Neurological: Positive for dizziness.  Psychiatric/Behavioral: Negative.   All other systems reviewed and are negative.    BP 100/46  Pulse 73  Resp 16  Ht 5\' 7"  (1.702 m)  Wt 168 lb 8 oz (76.431 kg)  BMI 26.38 kg/m2  Physical Exam  Nursing note and vitals reviewed. Constitutional: He is oriented to person, place, and time. He appears well-developed and well-nourished.  HENT:  Head: Normocephalic.  Nose: Nose normal.  Mouth/Throat: Oropharynx is clear and moist.  Eyes: Conjunctivae are normal. Pupils are equal, round, and reactive to light.  Neck: Normal range of motion. Neck supple. No JVD present.  Cardiovascular: Normal rate, regular rhythm, S1 normal, S2 normal, normal heart sounds and intact distal pulses.  Exam reveals no gallop and no friction rub.   No murmur heard. Pulmonary/Chest: Effort normal and breath sounds normal. No respiratory distress. He has no wheezes. He has no rales. He exhibits no tenderness.  Abdominal: Soft. Bowel sounds are normal. He exhibits no distension. There is no tenderness.  Musculoskeletal: Normal range of motion. He exhibits no edema and no tenderness.  Decreased range of motion of the neck and upper extremities  Lymphadenopathy:    He has no cervical adenopathy.  Neurological: He  is alert and oriented to person, place, and time. Coordination normal.  Skin: Skin is warm and dry. No rash noted. No erythema.  Psychiatric: He has a normal mood and affect. His behavior is normal. Judgment and thought content normal.      Assessment and Plan

## 2013-03-07 NOTE — Assessment & Plan Note (Signed)
Recent dizzy episodes concerning for hypotension. blood pressure on recheck today was 100 systolic. As above, fluids, salt intake. Could use other medication such as Florinef if needed for worsening dizzy episodes

## 2013-03-07 NOTE — Assessment & Plan Note (Signed)
Blood pressure low. Will encourage fluid and salt intake

## 2013-03-07 NOTE — Assessment & Plan Note (Signed)
Maintaining normal sinus rhythm, frequent PVCs on his event monitor. We'll continue low-dose beta blocker 25 mg twice a day. Encouraged by mouth fluid intake and salt intake given his low blood pressure which is likely causing his rare dizzy episodes.

## 2013-03-08 ENCOUNTER — Other Ambulatory Visit: Payer: Self-pay | Admitting: *Deleted

## 2013-03-08 DIAGNOSIS — E781 Pure hyperglyceridemia: Secondary | ICD-10-CM

## 2013-03-08 DIAGNOSIS — G9589 Other specified diseases of spinal cord: Secondary | ICD-10-CM

## 2013-03-08 DIAGNOSIS — I4891 Unspecified atrial fibrillation: Secondary | ICD-10-CM

## 2013-03-18 ENCOUNTER — Other Ambulatory Visit: Payer: Self-pay

## 2013-03-21 ENCOUNTER — Encounter: Payer: Self-pay | Admitting: Geriatric Medicine

## 2013-03-22 ENCOUNTER — Encounter: Payer: Self-pay | Admitting: Internal Medicine

## 2013-03-22 ENCOUNTER — Ambulatory Visit (INDEPENDENT_AMBULATORY_CARE_PROVIDER_SITE_OTHER): Payer: Medicare PPO | Admitting: Internal Medicine

## 2013-03-22 VITALS — BP 118/82 | HR 70 | Temp 97.8°F | Resp 16 | Ht 67.0 in | Wt 167.4 lb

## 2013-03-22 DIAGNOSIS — M4712 Other spondylosis with myelopathy, cervical region: Secondary | ICD-10-CM | POA: Insufficient documentation

## 2013-03-22 DIAGNOSIS — M545 Low back pain, unspecified: Secondary | ICD-10-CM

## 2013-03-22 DIAGNOSIS — D638 Anemia in other chronic diseases classified elsewhere: Secondary | ICD-10-CM

## 2013-03-22 DIAGNOSIS — M4800 Spinal stenosis, site unspecified: Secondary | ICD-10-CM

## 2013-03-22 DIAGNOSIS — M5030 Other cervical disc degeneration, unspecified cervical region: Secondary | ICD-10-CM

## 2013-03-22 DIAGNOSIS — I10 Essential (primary) hypertension: Secondary | ICD-10-CM

## 2013-03-22 DIAGNOSIS — M48062 Spinal stenosis, lumbar region with neurogenic claudication: Secondary | ICD-10-CM | POA: Insufficient documentation

## 2013-03-22 DIAGNOSIS — E782 Mixed hyperlipidemia: Secondary | ICD-10-CM

## 2013-03-22 DIAGNOSIS — E781 Pure hyperglyceridemia: Secondary | ICD-10-CM

## 2013-03-22 DIAGNOSIS — G9589 Other specified diseases of spinal cord: Secondary | ICD-10-CM

## 2013-03-22 DIAGNOSIS — I4891 Unspecified atrial fibrillation: Secondary | ICD-10-CM

## 2013-03-22 NOTE — Assessment & Plan Note (Signed)
Had more significant hypertriglyceridemia previously and on tricor at the moment which is too expensive.  Recheck FLP today and will change to an alterative if possible that is cheaper.

## 2013-03-22 NOTE — Progress Notes (Signed)
Patient ID: Eric [name], male   DOB: 1932/09/02, 79 y.o.   MRN: [id number]   Allergies  Allergen Reactions  . Morphine And Related Shortness Of Breath  . Percocet (Oxycodone-Acetaminophen) Shortness Of Breath  . Valium Shortness Of Breath    Chief Complaint  Patient presents with  . Medical Managment of Chronic Issues    HPI: Patient is a 79 y.o. Caucasian male seen in the office today for management of his chronic conditions.  Is doing a little better.  Wonders if his blood count is down b/c he has been chilly all of the time.    Has not fallen anymore since I saw him last time.    Has been sitting outside with son and got windburn and sunburn.  Never used to get burned.    Pain is still there.  Pain in right leg is bothering him the most right now.  Arms and neck and doing better since therapy.  Less difficulty with dropping things.  They left him exercises to do, and he did do them yesterday.  His granddaughter helps count when he does them.    Says appetite is doing better and he has been eating.  Weight has stabilized.  Swallowing has been ok.   Mood:  Is getting better.  Therapy helped with that, as well, a whole lot.  Was disappointed when it was over after the 6 wks.    Review of Systems:  ROS   Past Medical History  Diagnosis Date  . Hypertension   . DVT (deep venous thrombosis)   . PAF (paroxysmal atrial fibrillation)     a. 01/2011 Echo: EF 50-55%, Gr 1 DD, Mild AI/MR/TR.  Marland Kitchen Pneumonia 03/06/2003  . Arthritis   . Anxiety   . DDD (degenerative disc disease), cervical   . Squamous cell carcinoma of skin of trunk, except scrotum   . Depressive disorder, not elsewhere classified   . Depressive disorder, not elsewhere classified   . Thoracic or lumbosacral neuritis or radiculitis, unspecified   . Pain in limb   . Impacted cerumen   . Family history of other cardiovascular diseases   . Pure hyperglyceridemia   . Atrial fibrillation   . Spinal stenosis, unspecified  region other than cervical   . Edema   . Palpitations   . Other myelopathy   . Other dyspnea and respiratory abnormality   . Dysphagia, oral phase   . Hypoxemia    Past Surgical History  Procedure Laterality Date  . History of abd ultrasound  11/01    fatty liver  . Cervical fusion  02/10/2011   Social History:   reports that he has never smoked. He has never used smokeless tobacco. He reports that he does not drink alcohol or use illicit drugs.  Family History  Problem Relation Age of Onset  . Stroke Father   . Atrial fibrillation Brother     on coumadin  . Heart disease Brother     AFib- coumadin  . Stroke Paternal Grandfather   . Cancer Other     colon cancer at early age    Medications: Patient's Medications  New Prescriptions   No medications on file  Previous Medications   CHOLECALCIFEROL (VITAMIN D) 1000 UNITS CAPSULE    Take 1,000 Units by mouth daily.   FENOFIBRATE (TRICOR) 145 MG TABLET    Take 145 mg by mouth daily.   GABAPENTIN (NEURONTIN) 300 MG CAPSULE    Take 300 mg by mouth 3 (  three) times daily.    METOPROLOL (LOPRESSOR) 100 MG TABLET    Take 50 mg by mouth 2 (two) times daily.    TRAMADOL (ULTRAM) 50 MG TABLET    Take 50 mg by mouth 2 (two) times daily as needed.   Modified Medications   No medications on file  Discontinued Medications   No medications on file     Physical Exam:  Filed Vitals:   03/22/13 0929  BP: 118/82  Pulse: 70  Temp: 97.8 F (36.6 C)  TempSrc: Oral  Resp: 16  Height: 5\' 7"  (1.702 m)  Weight: 167 lb 6.4 oz (75.932 kg)   Physical Exam    Labs reviewed: Basic Metabolic Panel:  Recent Labs  21/30/86 1433  NA 141  K 4.6  CL 101  CO2 26  GLUCOSE 84  BUN 20  CREATININE 0.90  CALCIUM 9.7  TSH 2.240   Past Procedures:     Assessment/Plan    Labs/tests ordered

## 2013-03-23 LAB — COMPREHENSIVE METABOLIC PANEL
ALT: 23 IU/L (ref 0–44)
AST: 26 IU/L (ref 0–40)
Albumin/Globulin Ratio: 1.4 (ref 1.1–2.5)
Albumin: 4.2 g/dL (ref 3.5–4.8)
Alkaline Phosphatase: 44 IU/L (ref 39–117)
BUN/Creatinine Ratio: 19 (ref 10–22)
BUN: 20 mg/dL (ref 8–27)
CO2: 26 mmol/L (ref 19–28)
Calcium: 10.2 mg/dL (ref 8.6–10.2)
Chloride: 103 mmol/L (ref 97–108)
Creatinine, Ser: 1.06 mg/dL (ref 0.76–1.27)
GFR calc Af Amer: 77 mL/min/{1.73_m2} (ref >59)
GFR calc non Af Amer: 67 mL/min/{1.73_m2} (ref >59)
Globulin, Total: 2.9 g/dL (ref 1.5–4.5)
Glucose: 78 mg/dL (ref 65–99)
Potassium: 4.6 mmol/L (ref 3.5–5.2)
Sodium: 143 mmol/L (ref 134–144)
Total Bilirubin: 0.5 mg/dL (ref 0.0–1.2)
Total Protein: 7.1 g/dL (ref 6.0–8.5)

## 2013-03-23 LAB — CBC WITH DIFFERENTIAL/PLATELET
Basophils Absolute: 0 10*3/uL (ref 0.0–0.2)
Basos: 1 % (ref 0–3)
Eos: 2 % (ref 0–5)
Eosinophils Absolute: 0.1 10*3/uL (ref 0.0–0.4)
HCT: 43.5 % (ref 37.5–51.0)
Hemoglobin: 14.9 g/dL (ref 12.6–17.7)
Immature Grans (Abs): 0 10*3/uL (ref 0.0–0.1)
Immature Granulocytes: 0 % (ref 0–2)
Lymphocytes Absolute: 1.7 10*3/uL (ref 0.7–3.1)
Lymphs: 29 % (ref 14–46)
MCH: 32 pg (ref 26.6–33.0)
MCHC: 34.3 g/dL (ref 31.5–35.7)
MCV: 93 fL (ref 79–97)
Monocytes Absolute: 0.5 10*3/uL (ref 0.1–0.9)
Monocytes: 8 % (ref 4–12)
Neutrophils Absolute: 3.5 10*3/uL (ref 1.4–7.0)
Neutrophils Relative %: 60 % (ref 40–74)
RBC: 4.66 x10E6/uL (ref 4.14–5.80)
RDW: 13.3 % (ref 12.3–15.4)
WBC: 5.8 10*3/uL (ref 3.4–10.8)

## 2013-03-23 LAB — LIPID PANEL
Chol/HDL Ratio: 4.5 ratio units (ref 0.0–5.0)
Cholesterol, Total: 166 mg/dL (ref 100–199)
HDL: 37 mg/dL — ABNORMAL LOW (ref >39)
LDL Calculated: 79 mg/dL (ref 0–99)
Triglycerides: 250 mg/dL — ABNORMAL HIGH (ref 0–149)
VLDL Cholesterol Cal: 50 mg/dL — ABNORMAL HIGH (ref 5–40)

## 2013-03-23 LAB — HEMOGLOBIN A1C
Est. average glucose Bld gHb Est-mCnc: 111 mg/dL
Hgb A1c MFr Bld: 5.5 % (ref 4.8–5.6)

## 2013-04-03 ENCOUNTER — Other Ambulatory Visit: Payer: Self-pay

## 2013-04-03 ENCOUNTER — Ambulatory Visit (INDEPENDENT_AMBULATORY_CARE_PROVIDER_SITE_OTHER): Payer: Medicare PPO

## 2013-04-03 DIAGNOSIS — I4891 Unspecified atrial fibrillation: Secondary | ICD-10-CM

## 2013-04-14 ENCOUNTER — Encounter: Payer: Self-pay | Admitting: Internal Medicine

## 2013-04-14 DIAGNOSIS — D638 Anemia in other chronic diseases classified elsewhere: Secondary | ICD-10-CM | POA: Insufficient documentation

## 2013-04-14 DIAGNOSIS — E782 Mixed hyperlipidemia: Secondary | ICD-10-CM | POA: Insufficient documentation

## 2013-04-14 NOTE — Assessment & Plan Note (Signed)
Stable at this point.  Had a period of time where his strength suddenly declined.  Still has difficulty holding onto things, but does not want any more surgery at this point.  Is getting more frail, and no longer to do his outside work like he used to.

## 2013-04-14 NOTE — Assessment & Plan Note (Signed)
Follow up CBC to be sure his fatigue is not due to worsening anemia at this point.

## 2013-04-14 NOTE — Assessment & Plan Note (Signed)
Chronic pain from this is helped with tramadol and he especially noted some benefit from gabapentin.  Continue these and monitor bowels and renal function.

## 2013-04-14 NOTE — Assessment & Plan Note (Signed)
Still has radicular pain down his right leg.  Appears he's had some improvement overall in pain since his injection though he does not admit that.

## 2013-04-14 NOTE — Assessment & Plan Note (Signed)
Regular rhythm today with low dose beta blocker.  Again, emphasized hydration to him and standing up slowly when he gets moving.

## 2013-04-14 NOTE — Assessment & Plan Note (Addendum)
BP is at goal.  Has tendency to get orthostatic and must take his time standing up.  F/u BMP for electrolytes and renal function.

## 2013-05-09 ENCOUNTER — Encounter: Payer: Self-pay | Admitting: Cardiovascular Disease

## 2013-05-09 ENCOUNTER — Ambulatory Visit (INDEPENDENT_AMBULATORY_CARE_PROVIDER_SITE_OTHER): Payer: Medicare PPO | Admitting: Cardiovascular Disease

## 2013-05-09 VITALS — BP 134/79 | HR 88 | Ht 67.0 in | Wt 168.5 lb

## 2013-05-09 DIAGNOSIS — E785 Hyperlipidemia, unspecified: Secondary | ICD-10-CM

## 2013-05-09 DIAGNOSIS — R42 Dizziness and giddiness: Secondary | ICD-10-CM

## 2013-05-09 DIAGNOSIS — I10 Essential (primary) hypertension: Secondary | ICD-10-CM

## 2013-05-09 DIAGNOSIS — M48062 Spinal stenosis, lumbar region with neurogenic claudication: Secondary | ICD-10-CM

## 2013-05-09 DIAGNOSIS — R0602 Shortness of breath: Secondary | ICD-10-CM

## 2013-05-09 DIAGNOSIS — I4891 Unspecified atrial fibrillation: Secondary | ICD-10-CM

## 2013-05-09 NOTE — Patient Instructions (Addendum)
You are doing well. No medication changes were made.  Talk with Dr. Renato Gails about high dose of gabapentin at night Ask about other options:  cymbalta and others, lyrica?  Please call us if you have new issues that need to be addressed before your next appt.  Your physician wants you to follow-up in: 12 months.  You will receive a reminder letter in the mail two months in advance. If you don't receive a letter, please call our office to schedule the follow-up appointment.

## 2013-05-09 NOTE — Progress Notes (Signed)
Patient ID: Eric [name], male    DOB: [birth year], 79 y.o.   MRN: [id number]  HPI Comments: Eric [name] is a very pleasant 79 year old gentleman with a history of atrial fibrillation who  has been maintaining sinus rhythm, with severe neck disease and stenosis who underwent  cervical and thoracic laminectomy and fusion with instrumentation At the beginning of March with postoperative complications including aspiration requiring a feeding tube who presents for routine followup. 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 Occasional palpitations. His biggest complaint his biggest complaint is back pain, leg pain on the right, severe pain of the right knee and below down to the foot. He is taking Neurontin 3 times a day with tramadol but continues to have pain.  He is scheduled to have a cortisone shot in his back in the near future.  restless leg at nighttime.  Previous visit for  cortisone shot for his back  was put on hold secondary to ectopy .   In followup in our clinic, 30 day Holter monitor was ordered.This has shown frequent PVCs, occasional couplets, triplets. Very rare episodes of dizziness.  Previous weight loss, also on pain medication for his back. 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   Total cholesterol 166, LDL 79, triglycerides 250   EKG shows normal sinus rhythm with rate 70s beats per minute, no significant ST or T wave changes, first degree AV block, LAD, PVCs    Outpatient Encounter Prescriptions as of 05/09/2013  Medication Sig Dispense Refill  . Cholecalciferol (VITAMIN D) 1000 UNITS capsule Take 1,000 Units by mouth daily.      . fenofibrate (TRICOR) 145 MG tablet Take 145 mg by mouth daily.      Marland Kitchen gabapentin (NEURONTIN) 300 MG capsule Take 300 mg by mouth 3 (three) times daily.       . metoprolol (LOPRESSOR) 100 MG tablet Take 50 mg by mouth 2 (two) times daily.       . traMADol (ULTRAM) 50 MG tablet Take  50 mg by mouth 2 (two) times daily as needed.        No facility-administered encounter medications on file as of 05/09/2013.    Review of Systems  Constitutional: Negative.   HENT: Positive for neck stiffness.        Well-healed posterior surgical site on his neck  Eyes: Negative.   Respiratory: Negative.   Cardiovascular: Positive for palpitations.  Gastrointestinal: Negative.   Musculoskeletal: Positive for back pain and gait problem.  Skin: Negative.   Psychiatric/Behavioral: Negative.   All other systems reviewed and are negative.    BP 134/79  Pulse 88  Ht 5\' 7"  (1.702 m)  Wt 168 lb 8 oz (76.431 kg)  BMI 26.38 kg/m2  Physical Exam  Nursing note and vitals reviewed. Constitutional: He is oriented to person, place, and time. He appears well-developed and well-nourished.  HENT:  Head: Normocephalic.  Nose: Nose normal.  Mouth/Throat: Oropharynx is clear and moist.  Eyes: Conjunctivae are normal. Pupils are equal, round, and reactive to light.  Neck: Normal range of motion. Neck supple. No JVD present.  Cardiovascular: Normal rate, regular rhythm, S1 normal, S2 normal, normal heart sounds and intact distal pulses.  Exam reveals no gallop and no friction rub.   No murmur heard. Pulmonary/Chest: Effort normal and breath sounds normal. No respiratory distress. He has no wheezes. He has no rales. He exhibits no tenderness.  Abdominal: Soft. Bowel sounds are normal. He exhibits no distension. There is no tenderness.  Musculoskeletal: Normal range of motion. He exhibits no edema and no tenderness.  Decreased range of motion of the neck and upper extremities  Lymphadenopathy:    He has no cervical adenopathy.  Neurological: He is alert and oriented to person, place, and time. Coordination normal.  Skin: Skin is warm and dry. No rash noted. No erythema.  Psychiatric: He has a normal mood and affect. His behavior is normal. Judgment and thought content normal.       Assessment and Plan

## 2013-05-09 NOTE — Assessment & Plan Note (Signed)
Blood pressure is well controlled on today's visit. No changes made to the medications. 

## 2013-05-09 NOTE — Assessment & Plan Note (Signed)
Maintaining normal sinus rhythm. No changes to his medications. 

## 2013-05-09 NOTE — Assessment & Plan Note (Signed)
Severe back pain, right leg pain. Difficulty sleeping secondary to pain. We have asked him to talk with Dr. Renato Gails about  other possible medication options. He [place] benefit from higher dose Neurontin particularly at nighttime for sleep . Uncertain if he is a candidate for newer agents such as Cymbalta or lyrica.

## 2013-05-09 NOTE — Assessment & Plan Note (Signed)
Cholesterol is at goal on the current lipid regimen. No changes to the medications were made. Triglycerides elevated. He could try fish oil or flaxseed in addition to fenofibrate.

## 2013-05-22 ENCOUNTER — Other Ambulatory Visit: Payer: Self-pay | Admitting: *Deleted

## 2013-06-19 ENCOUNTER — Other Ambulatory Visit: Payer: Medicare PPO

## 2013-06-21 ENCOUNTER — Ambulatory Visit: Payer: Medicare PPO | Admitting: Internal Medicine

## 2013-06-28 ENCOUNTER — Encounter: Payer: Self-pay | Admitting: Internal Medicine

## 2013-06-28 ENCOUNTER — Ambulatory Visit (INDEPENDENT_AMBULATORY_CARE_PROVIDER_SITE_OTHER): Payer: Medicare PPO | Admitting: Internal Medicine

## 2013-06-28 VITALS — BP 142/84 | HR 53 | Temp 97.9°F | Resp 14 | Ht 67.0 in | Wt 164.8 lb

## 2013-06-28 DIAGNOSIS — H269 Unspecified cataract: Secondary | ICD-10-CM

## 2013-06-28 DIAGNOSIS — M5412 Radiculopathy, cervical region: Secondary | ICD-10-CM | POA: Insufficient documentation

## 2013-06-28 DIAGNOSIS — M4800 Spinal stenosis, site unspecified: Secondary | ICD-10-CM

## 2013-06-28 DIAGNOSIS — M48062 Spinal stenosis, lumbar region with neurogenic claudication: Secondary | ICD-10-CM

## 2013-06-28 DIAGNOSIS — I10 Essential (primary) hypertension: Secondary | ICD-10-CM

## 2013-06-28 DIAGNOSIS — M4712 Other spondylosis with myelopathy, cervical region: Secondary | ICD-10-CM

## 2013-06-28 DIAGNOSIS — E782 Mixed hyperlipidemia: Secondary | ICD-10-CM

## 2013-06-28 NOTE — Assessment & Plan Note (Signed)
Stable.  Has been more functional again recently.

## 2013-06-28 NOTE — Progress Notes (Signed)
Patient ID: Eric [name], male   DOB: 1932/02/10, 79 y.o.   MRN: [id number] Location:  [facility]   Allergies  Allergen Reactions  . Morphine And Related Shortness Of Breath  . Percocet (Oxycodone-Acetaminophen) Shortness Of Breath  . Valium Shortness Of Breath    Chief Complaint  Patient presents with  . Medical Managment of Chronic Issues    HPI: Patient is a 79 y.o. white male seen in the office today for medical mgt of chronic diseases.    Pain is getting better, he says, but still has to take pills for pain in the right leg.  Is getting around a little better.  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    Appetite is great, he says.    Mood is "alright".  Sleeping well.    Breathing pretty good.  Not getting sob when working outside.    Swallowing better, too.  Is fasting this am.    Out of cholesterol medicine--needs to refill today.  Review of Systems:  Review of Systems  Constitutional: Positive for malaise/fatigue. Negative for fever, chills and weight loss.  HENT: Negative for congestion.   Eyes: Negative for blurred vision.  Respiratory: Positive for cough, shortness of breath and wheezing.   Cardiovascular: Positive for chest pain. Negative for leg swelling.       Right lower ribs hurt;  Right leg edema if he does not elevate that leg during rest  Gastrointestinal: Negative for heartburn, abdominal pain, constipation, blood in stool and melena.  Genitourinary: Negative for dysuria, urgency and frequency.  Musculoskeletal: Positive for back pain and joint pain. Negative for falls.  Skin: Negative for rash.  Neurological: Positive for weakness. Negative for dizziness and headaches.  Endo/Heme/Allergies: Bruises/bleeds easily.  Psychiatric/Behavioral: The patient has insomnia.        But naps all day     Past Medical History  Diagnosis Date  . Hypertension   .  DVT (deep venous thrombosis)   . PAF (paroxysmal atrial fibrillation)     a. 01/2011 Echo: EF 50-55%, Gr 1 DD, Mild AI/MR/TR.  Marland Kitchen Pneumonia 03/06/2003  . Arthritis   . Anxiety   . DDD (degenerative disc disease), cervical   . Squamous cell carcinoma of skin of trunk, except scrotum   . Depressive disorder, not elsewhere classified   . Depressive disorder, not elsewhere classified   . Thoracic or lumbosacral neuritis or radiculitis, unspecified   . Pain in limb   . Impacted cerumen   . Family history of other cardiovascular diseases(V17.49)   . Mixed hyperlipidemia   . Atrial fibrillation   . Spinal stenosis, unspecified region other than cervical   . Edema   . Palpitations   . Other myelopathy   . Other dyspnea and respiratory abnormality   . Dysphagia, oral phase   . Hypoxemia     Past Surgical History  Procedure Laterality Date  . History of abd ultrasound  11/01    fatty liver  . Cervical fusion  02/10/2011    Social History:   reports that he has never smoked. He has never used smokeless tobacco. He reports that he does not drink alcohol or use illicit drugs.  Family History  Problem Relation Age of Onset  . Stroke Father   . Atrial fibrillation Brother     on coumadin  . Heart disease Brother  AFib- coumadin  . Stroke Paternal Grandfather   . Cancer Other     colon cancer at early age    Medications: Patient's Medications  New Prescriptions   No medications on file  Previous Medications   CHOLECALCIFEROL (VITAMIN D) 1000 UNITS CAPSULE    Take 1,000 Units by mouth daily.   FENOFIBRATE (TRICOR) 145 MG TABLET    Take 145 mg by mouth daily.   GABAPENTIN (NEURONTIN) 300 MG CAPSULE    Take 300 mg by mouth 3 (three) times daily.    METOPROLOL (LOPRESSOR) 100 MG TABLET    Take 50 mg by mouth 2 (two) times daily.    TRAMADOL (ULTRAM) 50 MG TABLET    Take 50 mg by mouth 2 (two) times daily as needed.   Modified Medications   No medications on file  Discontinued  Medications   No medications on file     Physical Exam: Filed Vitals:   06/28/13 1000  BP: 142/84  Pulse: 53  Temp: 97.9 F (36.6 C)  TempSrc: Oral  Resp: 14  Height: 5\' 7"  (1.702 m)  Weight: 164 lb 12.8 oz (74.753 kg)  Physical Exam  Constitutional: He is oriented to person, place, and time. No distress.   chronically ill appearing white male  HENT:  Head: Normocephalic and atraumatic.  Cardiovascular: Normal heart sounds and intact distal pulses.   irreg irreg  Pulmonary/Chest: Effort normal and breath sounds normal.  Abdominal: Soft. Bowel sounds are normal.  Musculoskeletal:  Has atrophy of thenar muscles, tenderness of neck and shoulders, ambulates lifting right leg high due to foot drop, using cane  Neurological: He is alert and oriented to person, place, and time. No cranial nerve deficit.  Skin: Skin is warm and dry.     Labs reviewed: Basic Metabolic Panel:  Recent Labs  98/11/91 1433 03/22/13 1035  NA 141 143  K 4.6 4.6  CL 101 103  CO2 26 26  GLUCOSE 84 78  BUN 20 20  CREATININE 0.90 1.06  CALCIUM 9.7 10.2  TSH 2.240  --    Liver Function Tests:  Recent Labs  03/22/13 1035  AST 26  ALT 23  ALKPHOS 44  BILITOT 0.5  PROT 7.1  CBC:  Recent Labs  03/22/13 1035  WBC 5.8  NEUTROABS 3.5  HGB 14.9  HCT 43.5  MCV 93   Lipid Panel:  Recent Labs  03/22/13 1035  HDL 37*  LDLCALC 79  TRIG 478*  CHOLHDL 4.5   Lab Results  Component Value Date   HGBA1C 5.5 03/22/2013   Assessment/Plan Spondylosis, cervical, with myelopathy Stable.  Has been more functional again recently.    Spinal stenosis, lumbar region, with neurogenic claudication Has had injections with some benefit in his right leg pain.  Seems to have worsening foot drop on right as he walks.  No recent falls.  Hyperlipidemia, mixed F/u lipid panel today.  Has had high TG on previous labs and is on fenofibrate, but it is expensive.  He is due to refill today.  Cataract of  both eyes Has upcoming surgery scheduled for removal.   Labs/tests ordered:  FLP Next appt:  3mos

## 2013-06-28 NOTE — Assessment & Plan Note (Signed)
F/u lipid panel today.  Has had high TG on previous labs and is on fenofibrate, but it is expensive.  He is due to refill today.

## 2013-06-28 NOTE — Assessment & Plan Note (Signed)
Has upcoming surgery scheduled for removal.

## 2013-06-28 NOTE — Assessment & Plan Note (Signed)
Has had injections with some benefit in his right leg pain.  Seems to have worsening foot drop on right as he walks.  No recent falls.

## 2013-06-29 LAB — LIPID PANEL
Chol/HDL Ratio: 3.8 ratio (ref 0.0–5.0)
Cholesterol, Total: 138 mg/dL (ref 100–199)
HDL: 36 mg/dL — ABNORMAL LOW
LDL Calculated: 68 mg/dL (ref 0–99)
Triglycerides: 169 mg/dL — ABNORMAL HIGH (ref 0–149)
VLDL Cholesterol Cal: 34 mg/dL (ref 5–40)

## 2013-07-08 ENCOUNTER — Other Ambulatory Visit: Payer: Self-pay | Admitting: *Deleted

## 2013-07-08 MED ORDER — PRAVASTATIN SODIUM 10 MG PO TABS: ORAL_TABLET | ORAL | Status: DC

## 2013-08-09 ENCOUNTER — Other Ambulatory Visit: Payer: Self-pay | Admitting: *Deleted

## 2013-08-09 MED ORDER — TRAMADOL HCL 50 MG PO TABS
50.0000 mg | ORAL_TABLET | Freq: Two times a day (BID) | ORAL | Status: DC | PRN
Start: 2013-08-09 — End: 2013-11-12

## 2013-08-09 MED ORDER — VITAMIN D 1000 UNITS PO CAPS: ORAL_CAPSULE | ORAL | Status: DC

## 2013-08-09 MED ORDER — PRAVASTATIN SODIUM 10 MG PO TABS: ORAL_TABLET | ORAL | Status: DC

## 2013-08-09 MED ORDER — GABAPENTIN 300 MG PO CAPS: 300.0000 mg | ORAL_CAPSULE | Freq: Three times a day (TID) | ORAL | Status: DC

## 2013-08-09 MED ORDER — METOPROLOL TARTRATE 100 MG PO TABS: 50.0000 mg | ORAL_TABLET | Freq: Two times a day (BID) | ORAL | Status: DC

## 2013-08-14 ENCOUNTER — Other Ambulatory Visit: Payer: Self-pay | Admitting: *Deleted

## 2013-08-15 ENCOUNTER — Other Ambulatory Visit: Payer: Self-pay | Admitting: *Deleted

## 2013-08-16 ENCOUNTER — Other Ambulatory Visit: Payer: Self-pay | Admitting: *Deleted

## 2013-10-03 ENCOUNTER — Encounter: Payer: Self-pay | Admitting: Internal Medicine

## 2013-10-03 ENCOUNTER — Ambulatory Visit (INDEPENDENT_AMBULATORY_CARE_PROVIDER_SITE_OTHER): Payer: Medicare PPO | Admitting: Internal Medicine

## 2013-10-03 VITALS — BP 144/90 | HR 74 | Temp 97.1°F | Resp 12 | Wt 170.0 lb

## 2013-10-03 DIAGNOSIS — I4891 Unspecified atrial fibrillation: Secondary | ICD-10-CM

## 2013-10-03 DIAGNOSIS — J329 Chronic sinusitis, unspecified: Secondary | ICD-10-CM

## 2013-10-03 DIAGNOSIS — B9789 Other viral agents as the cause of diseases classified elsewhere: Secondary | ICD-10-CM

## 2013-10-03 DIAGNOSIS — E782 Mixed hyperlipidemia: Secondary | ICD-10-CM

## 2013-10-03 DIAGNOSIS — M4712 Other spondylosis with myelopathy, cervical region: Secondary | ICD-10-CM

## 2013-10-03 DIAGNOSIS — I10 Essential (primary) hypertension: Secondary | ICD-10-CM

## 2013-10-03 DIAGNOSIS — M48062 Spinal stenosis, lumbar region with neurogenic claudication: Secondary | ICD-10-CM

## 2013-10-03 DIAGNOSIS — Z23 Encounter for immunization: Secondary | ICD-10-CM

## 2013-10-03 MED ORDER — TETANUS-DIPHTH-ACELL PERTUSSIS 5-2.5-18.5 LF-MCG/0.5 IM SUSP: 0.5000 mL | Freq: Once | INTRAMUSCULAR | Status: DC

## 2013-10-03 MED ORDER — LEVOCETIRIZINE DIHYDROCHLORIDE 5 MG PO TABS: 5.0000 mg | ORAL_TABLET | Freq: Every evening | ORAL | Status: DC

## 2013-10-03 NOTE — Progress Notes (Signed)
Patient ID: Eric [name], male   DOB: [birth year], 79 y.o.   MRN: [id number] Location:  [facility] / [facility]  Code Status: DNR   Allergies  Allergen Reactions  . Morphine And Related Shortness Of Breath  . Percocet [Oxycodone-Acetaminophen] Shortness Of Breath  . Valium Shortness Of Breath    Chief Complaint  Patient presents with  . Medical Managment of Chronic Issues    3 month follow-up, fasting if any labs due   . Cough    Dry cough off/on all summer, worse x 2 weeks   . Immunizations    ? if ok to get flu vaccine with symptoms of cough     HPI: Patient is a 79 y.o. white male seen in the office today for medical mgt of chronic diseases, dry cough, and for his flu shot.    Has had cough all summer, but worse past 2 wks.  Nonproductive.  No headache.  Late in afternoon, it starts.  Coughs some when trying to go to sleep.  In am, when goes to get up, some phlegm.  Was taking little red pill in am and an hour before bed.  No sore throat.  No fever, chills along the way.  His wife also has it.    Back is doing better with pain medicine.  Right leg will sting at times in medial leg.  Other than that, it's better.    Neck about the same.  Hands numb.  Doesn't want more neck surgery.    Still able to bathe, dress, feed himself, drive.    Vision better after cataracts removed in July and August.    Review of Systems:  Review of Systems  Constitutional: Positive for malaise/fatigue. Negative for fever and chills.  HENT: Positive for hearing loss.   Eyes: Negative for blurred vision.  Respiratory: Negative for cough and shortness of breath.   Cardiovascular: Negative for chest pain.  Gastrointestinal: Negative for abdominal pain, constipation, blood in stool and melena.  Genitourinary: Negative for dysuria.  Musculoskeletal: Positive for back pain, joint pain, myalgias and neck pain. Negative for falls.       Pain improved, walks with rollator walker   Skin: Negative for rash.  Neurological: Negative for dizziness and focal weakness.  Endo/Heme/Allergies: Does not bruise/bleed easily.  Psychiatric/Behavioral: Negative for depression and memory loss.    Past Medical History  Diagnosis Date  . Hypertension   . DVT (deep venous thrombosis)   . PAF (paroxysmal atrial fibrillation)     a. 01/2011 Echo: EF 50-55%, Gr 1 DD, Mild AI/MR/TR.  Marland Kitchen Pneumonia 03/06/2003  . Arthritis   . Anxiety   . DDD (degenerative disc disease), cervical   . Squamous cell carcinoma of skin of trunk, except scrotum   . Depressive disorder, not elsewhere classified   . Depressive disorder, not elsewhere classified   . Thoracic or lumbosacral neuritis or radiculitis, unspecified   . Pain in limb   . Impacted cerumen   . Family history of other cardiovascular diseases(V17.49)   . Mixed hyperlipidemia   . Atrial fibrillation   . Spinal stenosis, unspecified region other than cervical   . Edema   . Palpitations   . Other myelopathy   . Other dyspnea and respiratory abnormality   . Dysphagia, oral phase   . Hypoxemia     Past Surgical History  Procedure Laterality Date  . History of abd ultrasound  11/01    fatty liver  .  Cervical fusion  02/10/2011    Social History:   reports that he has never smoked. He has never used smokeless tobacco. He reports that he does not drink alcohol or use illicit drugs.  Family History  Problem Relation Age of Onset  . Stroke Father   . Atrial fibrillation Brother     on coumadin  . Heart disease Brother     AFib- coumadin  . Stroke Paternal Grandfather   . Cancer Other     colon cancer at early age    Medications: Patient's Medications  New Prescriptions   No medications on file  Previous Medications   CHOLECALCIFEROL (VITAMIN D) 1000 UNITS CAPSULE    Take one tablet once daily   GABAPENTIN (NEURONTIN) 300 MG CAPSULE    Take 1 capsule (300 mg total) by mouth 3 (three) times daily. For nerve pain    METOPROLOL (LOPRESSOR) 100 MG TABLET    Take 0.5 tablets (50 mg total) by mouth 2 (two) times daily. For heart   PRAVASTATIN (PRAVACHOL) 10 MG TABLET    Take one tablet once at bedtime for cholesterol   TDAP (BOOSTRIX) 5-2.5-18.5 LF-MCG/0.5 INJECTION    Inject 0.5 mLs into the muscle once.   TRAMADOL (ULTRAM) 50 MG TABLET    Take 1 tablet (50 mg total) by mouth 2 (two) times daily as needed.  Modified Medications   No medications on file  Discontinued Medications   No medications on file   Physical Exam: Filed Vitals:   10/03/13 0959  BP: 144/90  Pulse: 74  Temp: 97.1 F (36.2 C)  TempSrc: Oral  Resp: 12  Weight: 170 lb (77.111 kg)  SpO2: 97%  Physical Exam  Constitutional: He is oriented to person, place, and time. No distress.  HENT:  Head: Normocephalic and atraumatic.  Eyes: EOM are normal. Pupils are equal, round, and reactive to light.  Cardiovascular: Normal rate, regular rhythm, normal heart sounds and intact distal pulses.   Pulmonary/Chest: Effort normal and breath sounds normal. No respiratory distress.  Abdominal: Soft. Bowel sounds are normal. He exhibits no distension. There is no tenderness.  Musculoskeletal: He exhibits tenderness. He exhibits no edema.  Walks with limp, uses cane, pain over bilateral shoulders, numbness in hands  Neurological: He is alert and oriented to person, place, and time. No cranial nerve deficit.  Skin: Skin is warm and dry.    Labs reviewed: Basic Metabolic Panel:  Recent Labs  40/98/11 1433 03/22/13 1035  NA 141 143  K 4.6 4.6  CL 101 103  CO2 26 26  GLUCOSE 84 78  BUN 20 20  CREATININE 0.90 1.06  CALCIUM 9.7 10.2  TSH 2.240  --    Liver Function Tests:  Recent Labs  03/22/13 1035  AST 26  ALT 23  ALKPHOS 44  BILITOT 0.5  PROT 7.1  CBC:  Recent Labs  03/22/13 1035  WBC 5.8  NEUTROABS 3.5  HGB 14.9  HCT 43.5  MCV 93   Lipid Panel:  Recent Labs  03/22/13 1035 06/28/13 1046  HDL 37* 36*  LDLCALC 79  68  TRIG 250* 169*  CHOLHDL 4.5 3.8   Lab Results  Component Value Date   HGBA1C 5.5 03/22/2013   Assessment/Plan 1. Viral sinusitis - given allergy medication prescription--can get otc equivalent of zyrtec if insurance will not cover or too expensive -no signs of bacterial infection -seems he has same URI that's been going around -CBC with Differential - Basic metabolic panel  2.  Spinal stenosis, lumbar region, with neurogenic claudication -stable now  3. Spondylosis, cervical, with myelopathy -stable at this time, does not want surgery at his age  31. Atrial fibrillation -recently rate controlled  5. Hyperlipidemia, mixed -was changed to pravachol--f/u labs - Lipid panel  6. Essential hypertension, benign -cont current therapy--bp satisfactory - Basic metabolic panel  7. Need for prophylactic vaccination and inoculation against influenza -flu shot given today  Labs/tests ordered: cbc, bmp, flp today, flu shot Next appt:  4 mos

## 2013-10-04 ENCOUNTER — Other Ambulatory Visit: Payer: Self-pay | Admitting: Internal Medicine

## 2013-10-04 DIAGNOSIS — E782 Mixed hyperlipidemia: Secondary | ICD-10-CM

## 2013-10-04 LAB — CBC WITH DIFFERENTIAL/PLATELET
Basophils Absolute: 0.1 10*3/uL (ref 0.0–0.2)
Basos: 1 %
Eos: 3 %
Eosinophils Absolute: 0.2 10*3/uL (ref 0.0–0.4)
HCT: 43.6 % (ref 37.5–51.0)
Hemoglobin: 14.6 g/dL (ref 12.6–17.7)
Immature Grans (Abs): 0 10*3/uL (ref 0.0–0.1)
Immature Granulocytes: 0 %
Lymphocytes Absolute: 1.6 10*3/uL (ref 0.7–3.1)
Lymphs: 28 %
MCH: 31.7 pg (ref 26.6–33.0)
MCHC: 33.5 g/dL (ref 31.5–35.7)
MCV: 95 fL (ref 79–97)
Monocytes Absolute: 0.5 10*3/uL (ref 0.1–0.9)
Monocytes: 8 %
Neutrophils Absolute: 3.6 10*3/uL (ref 1.4–7.0)
Neutrophils Relative %: 60 %
RBC: 4.61 x10E6/uL (ref 4.14–5.80)
RDW: 12.9 % (ref 12.3–15.4)
WBC: 6 10*3/uL (ref 3.4–10.8)

## 2013-10-04 LAB — BASIC METABOLIC PANEL
BUN/Creatinine Ratio: 17 (ref 10–22)
BUN: 17 mg/dL (ref 8–27)
CO2: 23 mmol/L (ref 18–29)
Calcium: 9.7 mg/dL (ref 8.6–10.2)
Chloride: 102 mmol/L (ref 97–108)
Creatinine, Ser: 1.01 mg/dL (ref 0.76–1.27)
GFR calc Af Amer: 81 mL/min/{1.73_m2} (ref >59)
GFR calc non Af Amer: 70 mL/min/{1.73_m2} (ref >59)
Glucose: 85 mg/dL (ref 65–99)
Potassium: 5.2 mmol/L (ref 3.5–5.2)
Sodium: 144 mmol/L (ref 134–144)

## 2013-10-04 LAB — LIPID PANEL
Chol/HDL Ratio: 4.6 ratio units (ref 0.0–5.0)
Cholesterol, Total: 138 mg/dL (ref 100–199)
HDL: 30 mg/dL — ABNORMAL LOW (ref >39)
LDL Calculated: 67 mg/dL (ref 0–99)
Triglycerides: 205 mg/dL — ABNORMAL HIGH (ref 0–149)
VLDL Cholesterol Cal: 41 mg/dL — ABNORMAL HIGH (ref 5–40)

## 2013-10-04 MED ORDER — PRAVASTATIN SODIUM 20 MG PO TABS: ORAL_TABLET | ORAL | Status: DC

## 2013-11-12 ENCOUNTER — Other Ambulatory Visit: Payer: Self-pay | Admitting: *Deleted

## 2013-11-12 MED ORDER — TRAMADOL HCL 50 MG PO TABS: 50.0000 mg | ORAL_TABLET | Freq: Two times a day (BID) | ORAL | Status: DC | PRN

## 2013-11-12 MED ORDER — METOPROLOL TARTRATE 100 MG PO TABS: 50.0000 mg | ORAL_TABLET | Freq: Two times a day (BID) | ORAL | Status: DC

## 2013-11-12 MED ORDER — PRAVASTATIN SODIUM 20 MG PO TABS: ORAL_TABLET | ORAL | Status: DC

## 2013-11-12 MED ORDER — GABAPENTIN 300 MG PO CAPS: 300.0000 mg | ORAL_CAPSULE | Freq: Three times a day (TID) | ORAL | Status: DC

## 2013-11-12 NOTE — Telephone Encounter (Signed)
Called in Rx's into [facility] [phone] and spoke with Elease Hashimoto and Jaclyn Shaggy. They will ship medications to patient.  Member ID#:  [id number]

## 2014-01-30 ENCOUNTER — Ambulatory Visit: Payer: Medicare PPO | Admitting: Internal Medicine

## 2014-02-13 ENCOUNTER — Other Ambulatory Visit: Payer: Self-pay | Admitting: *Deleted

## 2014-02-14 ENCOUNTER — Other Ambulatory Visit: Payer: Self-pay | Admitting: *Deleted

## 2014-02-14 MED ORDER — PRAVASTATIN SODIUM 20 MG PO TABS: ORAL_TABLET | ORAL | Status: DC

## 2014-02-14 MED ORDER — TRAMADOL HCL 50 MG PO TABS: 50.0000 mg | ORAL_TABLET | Freq: Two times a day (BID) | ORAL | Status: DC | PRN

## 2014-02-14 MED ORDER — METOPROLOL TARTRATE 100 MG PO TABS: 50.0000 mg | ORAL_TABLET | Freq: Two times a day (BID) | ORAL | Status: DC

## 2014-02-14 MED ORDER — GABAPENTIN 300 MG PO CAPS: 300.0000 mg | ORAL_CAPSULE | Freq: Three times a day (TID) | ORAL | Status: DC

## 2014-02-14 NOTE — Telephone Encounter (Signed)
[employer] [facility] #[id number] Fax#: [phone]

## 2014-02-28 ENCOUNTER — Ambulatory Visit (INDEPENDENT_AMBULATORY_CARE_PROVIDER_SITE_OTHER): Payer: Medicare PPO | Admitting: Internal Medicine

## 2014-02-28 ENCOUNTER — Encounter: Payer: Self-pay | Admitting: Internal Medicine

## 2014-02-28 VITALS — BP 130/82 | HR 79 | Temp 97.4°F | Wt 171.0 lb

## 2014-02-28 DIAGNOSIS — E782 Mixed hyperlipidemia: Secondary | ICD-10-CM

## 2014-02-28 DIAGNOSIS — L723 Sebaceous cyst: Secondary | ICD-10-CM

## 2014-02-28 DIAGNOSIS — M48062 Spinal stenosis, lumbar region with neurogenic claudication: Secondary | ICD-10-CM

## 2014-02-28 DIAGNOSIS — I482 Chronic atrial fibrillation, unspecified: Secondary | ICD-10-CM

## 2014-02-28 DIAGNOSIS — M4712 Other spondylosis with myelopathy, cervical region: Secondary | ICD-10-CM

## 2014-02-28 DIAGNOSIS — I10 Essential (primary) hypertension: Secondary | ICD-10-CM

## 2014-02-28 DIAGNOSIS — I4891 Unspecified atrial fibrillation: Secondary | ICD-10-CM

## 2014-02-28 NOTE — Progress Notes (Signed)
Patient ID: Eric [name], male   DOB: [birth year], 80 y.o.   MRN: [id number]   Location:  [facility]   Allergies  Allergen Reactions  . Morphine And Related Shortness Of Breath  . Percocet [Oxycodone-Acetaminophen] Shortness Of Breath  . Valium Shortness Of Breath    Chief Complaint  Patient presents with  . Medical Managment of Chronic Issues    3 month follow-up   . Raised Area    Examine rasied area on neck- changed in size, patient denies pain     HPI: Patient is a 80 y.o. white male seen in the office today for 3 month f/u visit.  He also has a raised area on his neck that has changed in size.    Is walking better lately outside of here, too.  Got a shot in [place] [facility] in the lower back at [facility] and took about two weeks to work--has made a huge difference.  Worked a bit in the yard, but is not doing any digging or shovel.  Doing some raking.    Has a large cyst over his adam's apple area and slightly to the right.  Skin doctor appt is in April about this.    Left upper arm hurts from his arthritis--hurt when turning the steering wheel.     Review of Systems:  Review of Systems  Constitutional: Negative for fever, chills and malaise/fatigue.  HENT: Negative for congestion.   Eyes: Negative for blurred vision.  Respiratory: Negative for shortness of breath.   Cardiovascular: Negative for chest pain and leg swelling.  Gastrointestinal: Negative for abdominal pain.  Genitourinary: Negative for dysuria, urgency and frequency.  Musculoskeletal: Positive for back pain, myalgias and neck pain. Negative for falls.  Skin: Negative for rash.  Neurological: Positive for sensory change. Negative for dizziness, loss of consciousness and weakness.  Endo/Heme/Allergies: Bruises/bleeds easily.  Psychiatric/Behavioral: Negative for memory loss.       Mood better today     Past Medical History  Diagnosis Date  . Hypertension     . DVT (deep venous thrombosis)   . PAF (paroxysmal atrial fibrillation)     a. 01/2011 Echo: EF 50-55%, Gr 1 DD, Mild AI/MR/TR.  Marland Kitchen Pneumonia 03/06/2003  . Arthritis   . Anxiety   . DDD (degenerative disc disease), cervical   . Squamous cell carcinoma of skin of trunk, except scrotum   . Depressive disorder, not elsewhere classified   . Depressive disorder, not elsewhere classified   . Thoracic or lumbosacral neuritis or radiculitis, unspecified   . Pain in limb   . Impacted cerumen   . Family history of other cardiovascular diseases(V17.49)   . Mixed hyperlipidemia   . Atrial fibrillation   . Spinal stenosis, unspecified region other than cervical   . Edema   . Palpitations   . Other myelopathy   . Other dyspnea and respiratory abnormality   . Dysphagia, oral phase   . Hypoxemia     Past Surgical History  Procedure Laterality Date  . History of abd ultrasound  11/01    fatty liver  . Cervical fusion  02/10/2011    Social History:   reports that he has never smoked. He has never used smokeless tobacco. He reports that he does not drink alcohol or use illicit drugs.  Family History  Problem Relation Age of Onset  . Stroke Father   . Atrial fibrillation Brother     on coumadin  .  Heart disease Brother     AFib- coumadin  . Stroke Paternal Grandfather   . Cancer Other     colon cancer at early age    Medications: Patient's Medications  New Prescriptions   No medications on file  Previous Medications   CHOLECALCIFEROL (VITAMIN D) 1000 UNITS CAPSULE    Take one tablet once daily   METOPROLOL (LOPRESSOR) 100 MG TABLET    Take 0.5 tablets (50 mg total) by mouth 2 (two) times daily. For heart   PRAVASTATIN (PRAVACHOL) 20 MG TABLET    Take one tablet once at bedtime for cholesterol  Modified Medications   Modified Medication Previous Medication   GABAPENTIN (NEURONTIN) 300 MG CAPSULE gabapentin (NEURONTIN) 300 MG capsule      Take 300 mg by mouth 3 (three) times daily.  For nerve pain in Back    Take 1 capsule (300 mg total) by mouth 3 (three) times daily. For nerve pain   LEVOCETIRIZINE (XYZAL) 5 MG TABLET levocetirizine (XYZAL) 5 MG tablet      Take 5 mg by mouth every evening. For Allergies    Take 1 tablet (5 mg total) by mouth every evening.   TRAMADOL (ULTRAM) 50 MG TABLET traMADol (ULTRAM) 50 MG tablet      Take 50 mg by mouth 2 (two) times daily as needed. For Back Pain    Take 1 tablet (50 mg total) by mouth 2 (two) times daily as needed.  Discontinued Medications   TDAP (BOOSTRIX) 5-2.5-18.5 LF-MCG/0.5 INJECTION    Inject 0.5 mLs into the muscle once.     Physical Exam: Filed Vitals:   02/28/14 0929  BP: 130/82  Pulse: 79  Temp: 97.4 F (36.3 C)  TempSrc: Oral  Weight: 171 lb (77.565 kg)  SpO2: 95%  Physical Exam  Constitutional: He is oriented to person, place, and time. He appears well-developed and well-nourished. No distress.  HENT:  Head: Normocephalic.  Cardiovascular: Intact distal pulses.   irreg irreg  Pulmonary/Chest: Effort normal and breath sounds normal. No respiratory distress.  Abdominal: Soft. Bowel sounds are normal. He exhibits no distension and no mass. There is no tenderness.  Musculoskeletal: He exhibits no edema and no tenderness.  Has foot drop, uses cane to walk  Neurological: He is alert and oriented to person, place, and time.  Skin: Skin is warm and dry.  Large sebaceous cyst   Psychiatric: He has a normal mood and affect.    Labs reviewed: Basic Metabolic Panel:  Recent Labs  03/22/13 1035 10/03/13 1051  NA 143 144  K 4.6 5.2  CL 103 102  CO2 26 23  GLUCOSE 78 85  BUN 20 17  CREATININE 1.06 1.01  CALCIUM 10.2 9.7   Liver Function Tests:  Recent Labs  03/22/13 1035  AST 26  ALT 23  ALKPHOS 44  BILITOT 0.5  PROT 7.1  CBC:  Recent Labs  03/22/13 1035 10/03/13 1051  WBC 5.8 6.0  NEUTROABS 3.5 3.6  HGB 14.9 14.6  HCT 43.5 43.6  MCV 93 95   Lipid Panel:  Recent Labs   03/22/13 1035 06/28/13 1046 10/03/13 1051  HDL 37* 36* 30*  LDLCALC 79 68 67  TRIG 250* 169* 205*  CHOLHDL 4.5 3.8 4.6   Lab Results  Component Value Date   HGBA1C 5.5 03/22/2013   Dr. Normajean Glasgow [facility] Dr. Phylliss Bob neck surgery Dr. Ida Rogue at [facility]--cardiology  Assessment/Plan 1. Sebaceous cyst -advised that warm compresses can help bring  the fluid out of it and if it gets red, swollen, painful around it, it needs to be lysed, but better not trying to open it unless necessary - CBC With differential/Platelet  2. Spinal stenosis, lumbar region, with neurogenic claudication -some improvement as of late  3. Spondylosis, cervical, with myelopathy -does not seem to be worsening any longer, still does too much work outdoors and wears himself out  4. Atrial fibrillation -chronic stable, no changes - CBC With differential/Platelet  5. Mixed hyperlipidemia -reinforced diet, does get exercise - Comprehensive metabolic panel - Lipid panel  6. Essential hypertension, benign -bp at goal with current meds, no changes - CBC With differential/Platelet  Labs/tests ordered:   Orders Placed This Encounter  Procedures  . Comprehensive metabolic panel  . CBC With differential/Platelet  . Lipid panel   Next appt:  3 mos

## 2014-03-01 LAB — CBC WITH DIFFERENTIAL
Basophils Absolute: 0 10*3/uL (ref 0.0–0.2)
Basos: 1 %
Eos: 2 %
Eosinophils Absolute: 0.1 10*3/uL (ref 0.0–0.4)
HCT: 45 % (ref 37.5–51.0)
Hemoglobin: 15.5 g/dL (ref 12.6–17.7)
Immature Grans (Abs): 0 10*3/uL (ref 0.0–0.1)
Immature Granulocytes: 0 %
Lymphocytes Absolute: 1.8 10*3/uL (ref 0.7–3.1)
Lymphs: 34 %
MCH: 32 pg (ref 26.6–33.0)
MCHC: 34.4 g/dL (ref 31.5–35.7)
MCV: 93 fL (ref 79–97)
Monocytes Absolute: 0.5 10*3/uL (ref 0.1–0.9)
Monocytes: 10 %
Neutrophils Absolute: 2.9 10*3/uL (ref 1.4–7.0)
Neutrophils Relative %: 53 %
Platelets: 217 10*3/uL (ref 150–379)
RBC: 4.84 x10E6/uL (ref 4.14–5.80)
RDW: 13 % (ref 12.3–15.4)
WBC: 5.4 10*3/uL (ref 3.4–10.8)

## 2014-03-01 LAB — LIPID PANEL
Chol/HDL Ratio: 4.1 ratio units (ref 0.0–5.0)
Cholesterol, Total: 145 mg/dL (ref 100–199)
HDL: 35 mg/dL — ABNORMAL LOW (ref >39)
LDL Calculated: 75 mg/dL (ref 0–99)
Triglycerides: 175 mg/dL — ABNORMAL HIGH (ref 0–149)
VLDL Cholesterol Cal: 35 mg/dL (ref 5–40)

## 2014-03-01 LAB — COMPREHENSIVE METABOLIC PANEL
ALT: 37 IU/L (ref 0–44)
AST: 35 IU/L (ref 0–40)
Albumin/Globulin Ratio: 2 (ref 1.1–2.5)
Albumin: 4.7 g/dL (ref 3.5–4.8)
Alkaline Phosphatase: 43 IU/L (ref 39–117)
BUN/Creatinine Ratio: 17 (ref 10–22)
BUN: 17 mg/dL (ref 8–27)
CO2: 23 mmol/L (ref 18–29)
Calcium: 9.4 mg/dL (ref 8.6–10.2)
Chloride: 101 mmol/L (ref 97–108)
Creatinine, Ser: 1 mg/dL (ref 0.76–1.27)
GFR calc Af Amer: 82 mL/min/{1.73_m2} (ref >59)
GFR calc non Af Amer: 71 mL/min/{1.73_m2} (ref >59)
Globulin, Total: 2.3 g/dL (ref 1.5–4.5)
Glucose: 90 mg/dL (ref 65–99)
Potassium: 5 mmol/L (ref 3.5–5.2)
Sodium: 141 mmol/L (ref 134–144)
Total Bilirubin: 0.7 mg/dL (ref 0.0–1.2)
Total Protein: 7 g/dL (ref 6.0–8.5)

## 2014-03-27 ENCOUNTER — Other Ambulatory Visit: Payer: Self-pay | Admitting: Dermatology

## 2014-04-10 ENCOUNTER — Other Ambulatory Visit: Payer: Self-pay | Admitting: Dermatology

## 2014-05-09 ENCOUNTER — Encounter: Payer: Self-pay | Admitting: Cardiovascular Disease

## 2014-05-09 ENCOUNTER — Ambulatory Visit (INDEPENDENT_AMBULATORY_CARE_PROVIDER_SITE_OTHER): Payer: Medicare PPO | Admitting: Cardiovascular Disease

## 2014-05-09 VITALS — BP 108/72 | HR 76 | Ht 67.0 in | Wt 166.8 lb

## 2014-05-09 DIAGNOSIS — I10 Essential (primary) hypertension: Secondary | ICD-10-CM

## 2014-05-09 DIAGNOSIS — R0602 Shortness of breath: Secondary | ICD-10-CM

## 2014-05-09 DIAGNOSIS — I4891 Unspecified atrial fibrillation: Secondary | ICD-10-CM

## 2014-05-09 DIAGNOSIS — E785 Hyperlipidemia, unspecified: Secondary | ICD-10-CM

## 2014-05-09 DIAGNOSIS — I480 Paroxysmal atrial fibrillation: Secondary | ICD-10-CM

## 2014-05-09 NOTE — Patient Instructions (Signed)
You are doing well. No medication changes were made.  Please call us if you have new issues that need to be addressed before your next appt.  Your physician wants you to follow-up in: 12 months.  You will receive a reminder letter in the mail two months in advance. If you don't receive a letter, please call our office to schedule the follow-up appointment. 

## 2014-05-09 NOTE — Assessment & Plan Note (Signed)
Cholesterol is at goal on the current lipid regimen. No changes to the medications were made.  

## 2014-05-09 NOTE — Progress Notes (Signed)
Patient ID: Eric [name], male    DOB: 03/13/1932, 80 y.o.   MRN: [id number]  HPI Comments: Eric [name] is a very pleasant 80 year old gentleman with a history of atrial fibrillation, frequent PVCs, who  has been maintaining sinus rhythm, with severe neck disease and stenosis who underwent  cervical and thoracic laminectomy and fusion with instrumentation at the beginning of March with postoperative complications including aspiration requiring a feeding tube who presents for routine followup. 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  Prior back pain, leg pain on the right, severe pain of the right knee and below down to the foot. He does report since receiving a shot in his back, symptoms have been much better, now walking sometimes without a cane.   restless leg at nighttime. He reports having a limited range of motion of his left shoulder, unable to raise his left arm above his shoulder height. Denies any trauma to the left arm  Previous visit for  cortisone shot for his back  was put on hold secondary to ectopy .   In followup in our clinic, 30 day Holter monitor was ordered.This has shown frequent PVCs, occasional couplets, triplets. Very rare episodes of dizziness.  Previous weight loss, also on pain medication for his back. 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   Total cholesterol 145, LDL 75   EKG shows normal sinus rhythm with rate 70s beats per minute, no significant ST or T wave changes, first degree AV block, LAD, PVCs    Outpatient Encounter Prescriptions as of 05/09/2014  Medication Sig  . Cholecalciferol (VITAMIN D) 1000 UNITS capsule Take one tablet once daily  . gabapentin (NEURONTIN) 300 MG capsule Take 300 mg by mouth 3 (three) times daily. For nerve pain in Back  . levocetirizine (XYZAL) 5 MG tablet Take 5 mg by mouth every evening. For Allergies  . metoprolol (LOPRESSOR) 100 MG tablet Take 0.5 tablets  (50 mg total) by mouth 2 (two) times daily. For heart  . pravastatin (PRAVACHOL) 20 MG tablet Take one tablet once at bedtime for cholesterol  . traMADol (ULTRAM) 50 MG tablet Take 50 mg by mouth 2 (two) times daily as needed. For Back Pain    Review of Systems  Constitutional: Negative.   HENT: Negative.        Well-healed posterior surgical site on his neck  Eyes: Negative.   Respiratory: Negative.   Cardiovascular: Positive for palpitations.  Gastrointestinal: Negative.   Endocrine: Negative.   Musculoskeletal: Positive for back pain, gait problem and neck stiffness.  Skin: Negative.   Allergic/Immunologic: Negative.   Neurological: Negative.   Hematological: Negative.   Psychiatric/Behavioral: Negative.   All other systems reviewed and are negative.   BP 108/72  Pulse 76  Ht 5\' 7"  (1.702 m)  Wt 166 lb 12 oz (75.637 kg)  BMI 26.11 kg/m2  Physical Exam  Nursing note and vitals reviewed. Constitutional: He is oriented to person, place, and time. He appears well-developed and well-nourished.  HENT:  Head: Normocephalic.  Nose: Nose normal.  Mouth/Throat: Oropharynx is clear and moist.  Eyes: Conjunctivae are normal. Pupils are equal, round, and reactive to light.  Neck: Normal range of motion. Neck supple. No JVD present.  Cardiovascular: Normal rate, regular rhythm, S1 normal, S2 normal, normal heart sounds and intact distal pulses.  Exam reveals no gallop and no friction rub.   No murmur heard. Pulmonary/Chest:  Effort normal and breath sounds normal. No respiratory distress. He has no wheezes. He has no rales. He exhibits no tenderness.  Abdominal: Soft. Bowel sounds are normal. He exhibits no distension. There is no tenderness.  Musculoskeletal: Normal range of motion. He exhibits no edema and no tenderness.  Decreased range of motion of the neck and upper extremities  Lymphadenopathy:    He has no cervical adenopathy.  Neurological: He is alert and oriented to  person, place, and time. Coordination normal.  Skin: Skin is warm and dry. No rash noted. No erythema.  Psychiatric: He has a normal mood and affect. His behavior is normal. Judgment and thought content normal.      Assessment and Plan

## 2014-05-09 NOTE — Assessment & Plan Note (Signed)
Maintaining normal sinus rhythm. No medication changes made 

## 2014-05-09 NOTE — Assessment & Plan Note (Signed)
Blood pressure is well controlled on today's visit. No changes made to the medications. 

## 2014-05-15 ENCOUNTER — Other Ambulatory Visit: Payer: Self-pay | Admitting: Cardiovascular Disease

## 2014-05-29 ENCOUNTER — Other Ambulatory Visit: Payer: Self-pay | Admitting: *Deleted

## 2014-05-29 MED ORDER — METOPROLOL TARTRATE 100 MG PO TABS: ORAL_TABLET | ORAL | Status: DC

## 2014-05-29 MED ORDER — PRAVASTATIN SODIUM 20 MG PO TABS: ORAL_TABLET | ORAL | Status: DC

## 2014-05-30 ENCOUNTER — Ambulatory Visit: Payer: Medicare PPO | Admitting: Internal Medicine

## 2014-06-02 ENCOUNTER — Encounter: Payer: Self-pay | Admitting: Internal Medicine

## 2014-06-12 ENCOUNTER — Ambulatory Visit (INDEPENDENT_AMBULATORY_CARE_PROVIDER_SITE_OTHER): Payer: Medicare PPO | Admitting: Nurse Practitioner

## 2014-06-12 ENCOUNTER — Encounter: Payer: Self-pay | Admitting: Nurse Practitioner

## 2014-06-12 ENCOUNTER — Ambulatory Visit: Payer: Medicare PPO | Admitting: Internal Medicine

## 2014-06-12 VITALS — BP 122/78 | HR 83 | Temp 98.1°F | Resp 18 | Ht 67.0 in | Wt 167.8 lb

## 2014-06-12 DIAGNOSIS — E785 Hyperlipidemia, unspecified: Secondary | ICD-10-CM

## 2014-06-12 DIAGNOSIS — E559 Vitamin D deficiency, unspecified: Secondary | ICD-10-CM

## 2014-06-12 DIAGNOSIS — J309 Allergic rhinitis, unspecified: Secondary | ICD-10-CM

## 2014-06-12 DIAGNOSIS — M4712 Other spondylosis with myelopathy, cervical region: Secondary | ICD-10-CM

## 2014-06-12 DIAGNOSIS — I10 Essential (primary) hypertension: Secondary | ICD-10-CM

## 2014-06-12 NOTE — Patient Instructions (Signed)
Follow up sooner if cough fails to improve after 6 weeks or gets worse, fever, chills, shortness of breath occurs  Cough, Adult  A cough is a reflex that helps clear your throat and airways. It can help heal the body or Faiola be a reaction to an irritated airway. A cough Balaban only last 2 or 3 weeks (acute) or Susman last more than 8 weeks (chronic).  CAUSES Acute cough:  Viral or bacterial infections. Chronic cough:  Infections.  Allergies.  Asthma.  Post-nasal drip.  Smoking.  Heartburn or acid reflux.  Some medicines.  Chronic lung problems (COPD).  Cancer. SYMPTOMS   Cough.  Fever.  Chest pain.  Increased breathing rate.  High-pitched whistling sound when breathing (wheezing).  Colored mucus that you cough up (sputum). TREATMENT   A bacterial cough Landen be treated with antibiotic medicine.  A viral cough must run its course and will not respond to antibiotics.  Your caregiver Badgett recommend other treatments if you have a chronic cough. HOME CARE INSTRUCTIONS   Only take over-the-counter or prescription medicines for pain, discomfort, or fever as directed by your caregiver. Use cough suppressants only as directed by your caregiver.  Use a cold steam vaporizer or humidifier in your bedroom or home to help loosen secretions.  Sleep in a semi-upright position if your cough is worse at night.  Rest as needed.  Stop smoking if you smoke. SEEK IMMEDIATE MEDICAL CARE IF:   You have pus in your sputum.  Your cough starts to worsen.  You cannot control your cough with suppressants and are losing sleep.  You begin coughing up blood.  You have difficulty breathing.  You develop pain which is getting worse or is uncontrolled with medicine.  You have a fever. MAKE SURE YOU:   Understand these instructions.  Will watch your condition.  Will get help right away if you are not doing well or get worse. Document Released: 05/27/2011 Document Revised: 02/20/2012  Document Reviewed: 05/27/2011 [facility] Patient Information 2015 [place], [place]. This information is not intended to replace advice given to you by your health care provider. Make sure you discuss any questions you have with your health care provider.

## 2014-06-12 NOTE — Progress Notes (Signed)
Patient ID: Eric [name], male   DOB: 03/05/1932, 80 y.o.   MRN: [id number]    Allergies  Allergen Reactions  . Morphine And Related Shortness Of Breath  . Percocet [Oxycodone-Acetaminophen] Shortness Of Breath  . Valium Shortness Of Breath    Chief Complaint  Patient presents with  . Follow-up    dry cough x 72mo,    HPI: Patient is a 80 y.o. male pt of Dr Mariea Clonts with PMH of Spinal stenosis, lumbar region, with neurogenic claudication, Spondylosis, cervical, with myelopathy, hx of atrial fibrillation, mixed hyperlipidemia, Essential hypertension seen in the office today for routine follow up. Saw cardiologist in [place], 2015 no changes were made. Pt remains in NSR. No worsening of pain.  Pt reports dry cough for over 1 month, non-smoker, no shortness of breath Wakes him up when hes sleeping. Also notices cough AFTER he eats not with a meal mucinex DM which helped, no fevers or chills   Review of Systems:  Review of Systems  Constitutional: Negative for fever, chills and malaise/fatigue.  HENT: Negative for congestion and sore throat.   Eyes: Negative for blurred vision.  Respiratory: Positive for cough. Negative for hemoptysis, sputum production, shortness of breath and wheezing.   Cardiovascular: Negative for chest pain and leg swelling.  Gastrointestinal: Negative for abdominal pain, diarrhea and constipation.  Genitourinary: Negative for dysuria, urgency and frequency.  Musculoskeletal: Positive for back pain, myalgias and neck pain. Negative for falls.       Pain controlled on medication  Skin: Negative for rash.       Went and had mole removed by dermatology, reports it was cancerous   Neurological: Negative for dizziness, loss of consciousness and weakness.  Psychiatric/Behavioral: Negative for memory loss.     Past Medical History  Diagnosis Date  . Hypertension   . DVT (deep venous thrombosis)   . PAF (paroxysmal atrial fibrillation)     a. 01/2011 Echo: EF 50-55%, Gr 1 DD,  Mild AI/MR/TR.  Marland Kitchen Pneumonia 03/06/2003  . Arthritis   . Anxiety   . DDD (degenerative disc disease), cervical   . Depressive disorder, not elsewhere classified   . Depressive disorder, not elsewhere classified   . Thoracic or lumbosacral neuritis or radiculitis, unspecified   . Pain in limb   . Impacted cerumen   . Family history of other cardiovascular diseases(V17.49)   . Mixed hyperlipidemia   . Atrial fibrillation   . Spinal stenosis, unspecified region other than cervical   . Edema   . Palpitations   . Other myelopathy   . Other dyspnea and respiratory abnormality   . Dysphagia, oral phase   . Hypoxemia   . Squamous cell carcinoma of skin of trunk, except scrotum    Past Surgical History  Procedure Laterality Date  . History of abd ultrasound  11/01    fatty liver  . Cervical fusion  02/10/2011   Social History:   reports that he has never smoked. He has never used smokeless tobacco. He reports that he does not drink alcohol or use illicit drugs.  Family History  Problem Relation Age of Onset  . Stroke Father   . Atrial fibrillation Brother     on coumadin  . Heart disease Brother     AFib- coumadin  . Stroke Paternal Grandfather   . Cancer Other     colon cancer at early age    Medications: Patient's Medications  New Prescriptions   No medications on file  Previous  Medications   CHOLECALCIFEROL (VITAMIN D) 1000 UNITS CAPSULE    Take one tablet once daily   GABAPENTIN (NEURONTIN) 300 MG CAPSULE    Take 300 mg by mouth 3 (three) times daily. For nerve pain in Back   LEVOCETIRIZINE (XYZAL) 5 MG TABLET    Take 5 mg by mouth every evening. For Allergies   METOPROLOL (LOPRESSOR) 100 MG TABLET    Take one tablet by mouth twice daily for heart   PRAVASTATIN (PRAVACHOL) 20 MG TABLET    Take one tablet once at bedtime for cholesterol   TRAMADOL (ULTRAM) 50 MG TABLET    Take 50 mg by mouth 2 (two) times daily as needed. For Back Pain  Modified Medications   No  medications on file  Discontinued Medications   No medications on file     Physical Exam:  Filed Vitals:   06/12/14 1052  BP: 122/78  Pulse: 83  Temp: 98.1 F (36.7 C)  TempSrc: Oral  Resp: 18  Height: 5\' 7"  (1.702 m)  Weight: 167 lb 12.8 oz (76.114 kg)  SpO2: 97%    Physical Exam Constitutional: He is oriented to person, place, and time. He appears well-developed and well-nourished. No distress.  HENT: unremarkable  Cardiovascular: Intact distal pulses. Heart RRR Pulmonary/Chest: Effort normal and breath sounds normal. No respiratory distress.  Abdominal: Soft. Bowel sounds are normal. He exhibits no distension and no mass. There is no tenderness.  Musculoskeletal: He exhibits no edema and no tenderness.  Has foot drop, uses cane to walk, no recent falls  Neurological: He is alert and oriented to person, place, and time.  Skin: Skin is warm and dry.   Psychiatric: He has a normal mood and affect.    Labs reviewed: Basic Metabolic Panel:  Recent Labs  10/03/13 1051 02/28/14 1042  NA 144 141  K 5.2 5.0  CL 102 101  CO2 23 23  GLUCOSE 85 90  BUN 17 17  CREATININE 1.01 1.00  CALCIUM 9.7 9.4   Liver Function Tests:  Recent Labs  02/28/14 1042  AST 35  ALT 37  ALKPHOS 43  BILITOT 0.7  PROT 7.0   No results found for this basename: LIPASE, AMYLASE,  in the last 8760 hours No results found for this basename: AMMONIA,  in the last 8760 hours CBC:  Recent Labs  10/03/13 1051 02/28/14 1042  WBC 6.0 5.4  NEUTROABS 3.6 2.9  HGB 14.6 15.5  HCT 43.6 45.0  MCV 95 93  PLT  --  217   Lipid Panel:  Recent Labs  06/28/13 1046 10/03/13 1051 02/28/14 1042  HDL 36* 30* 35*  LDLCALC 68 67 75  TRIG 169* 205* 175*  CHOLHDL 3.8 4.6 4.1   TSH: No results found for this basename: TSH,  in the last 8760 hours A1C: Lab Results  Component Value Date   HGBA1C 5.5 03/22/2013     Assessment/Plan  1. HYPERTENSION, BENIGN ESSENTIAL -well controlled on  lopressor, rate controlled  2. Spondylosis, cervical, with myelopathy -pain controlled with current regimen   3. HYPERLIPIDEMIA NEC/NOS -conts on Pravachol, conts diet modification    4. VITAMIN D DEFICIENCY -conts on supplement   5. Allergic rhinitis, unspecified allergic rhinitis type -with cough that Laverdure be related to virus or allergies,  mucinex DM helping  -conts xyzal   -follow up in 3 month or sooner if cough does not improve or worsens

## 2014-07-07 ENCOUNTER — Telehealth: Payer: Self-pay | Admitting: *Deleted

## 2014-07-07 DIAGNOSIS — M7501 Adhesive capsulitis of right shoulder: Secondary | ICD-10-CM

## 2014-07-07 NOTE — Telephone Encounter (Signed)
Patient called and stated that he needed a Chiropractor referral to [facility] # [phone]. Stated that he has an appointment for Wednesday at 10:00 for his Left Shoulder but needs a referral from PCP. Spoke with Dr. Mariea Clonts and she stated that this is fine as long as they don't mess with his neck due to arthritis and surgery.  LMOM with Dolores Frame to return call.

## 2014-07-07 NOTE — Telephone Encounter (Signed)
Spoke with Dr. Dolores Frame and gave her verbal order and spoke with her the concerns of Dr. Mariea Clonts. She stated that she was conservative and will only be working on his shoulder.

## 2014-07-10 NOTE — Telephone Encounter (Signed)
Patient saw Dr. Dolores Frame yesterday and she stated that patient needed to follow up with Dr. 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 Patient would like a follow up with Dr. Lynann Bologna, is it ok to refer. Please Advise.

## 2014-07-10 NOTE — Telephone Encounter (Signed)
Referral to Dr. Lynann Bologna is fine.

## 2014-07-11 NOTE — Addendum Note (Signed)
Addended by: Rafael Bihari A on: 07/11/2014 11:28 AM   Modules accepted: Orders

## 2014-07-11 NOTE — Telephone Encounter (Signed)
Order placed in system for referral.

## 2014-07-11 NOTE — Telephone Encounter (Signed)
Appointment made for patient at [facility]. With Dr. Tamera Punt (shoulder specialist)  [place] Appt:  07/23/2014 @ 1:45pm Patient Notified.

## 2014-09-12 ENCOUNTER — Ambulatory Visit: Payer: Medicare PPO | Admitting: Internal Medicine

## 2014-09-15 ENCOUNTER — Other Ambulatory Visit: Payer: Self-pay | Admitting: *Deleted

## 2014-09-15 ENCOUNTER — Ambulatory Visit (INDEPENDENT_AMBULATORY_CARE_PROVIDER_SITE_OTHER): Payer: Medicare PPO | Admitting: Internal Medicine

## 2014-09-15 ENCOUNTER — Encounter: Payer: Self-pay | Admitting: Internal Medicine

## 2014-09-15 VITALS — BP 132/84 | HR 79 | Temp 97.7°F | Resp 10 | Wt 165.0 lb

## 2014-09-15 DIAGNOSIS — Z23 Encounter for immunization: Secondary | ICD-10-CM

## 2014-09-15 DIAGNOSIS — M66822 Spontaneous rupture of other tendons, left upper arm: Secondary | ICD-10-CM

## 2014-09-15 DIAGNOSIS — M4712 Other spondylosis with myelopathy, cervical region: Secondary | ICD-10-CM

## 2014-09-15 DIAGNOSIS — I10 Essential (primary) hypertension: Secondary | ICD-10-CM

## 2014-09-15 DIAGNOSIS — E782 Mixed hyperlipidemia: Secondary | ICD-10-CM

## 2014-09-15 DIAGNOSIS — I480 Paroxysmal atrial fibrillation: Secondary | ICD-10-CM

## 2014-09-15 DIAGNOSIS — M48062 Spinal stenosis, lumbar region with neurogenic claudication: Secondary | ICD-10-CM

## 2014-09-15 DIAGNOSIS — M66322 Spontaneous rupture of flexor tendons, left upper arm: Secondary | ICD-10-CM

## 2014-09-15 DIAGNOSIS — M4806 Spinal stenosis, lumbar region: Secondary | ICD-10-CM

## 2014-09-15 NOTE — Progress Notes (Signed)
Patient ID: Eric [name], male   DOB: 03-27-1932, 80 y.o.   MRN: [id number]   Location:  [facility]  Code Status: reviewed importance of preparing a living will and hcpoa with him today and short form provided for completion and to return a copy to me   Allergies  Allergen Reactions  . Morphine And Related Shortness Of Breath  . Percocet [Oxycodone-Acetaminophen] Shortness Of Breath  . Valium Shortness Of Breath    Chief Complaint  Patient presents with  . Medical Management of Chronic Issues    3 month follow-up, fasting if labs needed, ongoing left arm concerns "Not working"     HPI: Patient is a 80 y.o. white male seen in the office today for med mgt chronic diseases.    He continues to have problems with his left arm "not working". Had biceps rupture.   Sore in outer aspect of upper arm.  Still cannot lift arm actively but can with his other arm.  Completes therapy tomorrow at [place] and goes back to Dr. Tamera Punt Wednesday. Says he is not into that cutting anymore after his complicated neck surgery.  Neck only hurts when he is driving or if he uses it in a funny way.    BP is at goal.    Continues to mow yard with push mower (self-propelled).    Mood has been pretty good.    Hasn't eaten so he can get bloodwork.  Back is doing pretty good.  Sometimes has pain, but generally has been better since February.  Thinks with the cold weather, he Zwiefelhofer need to return for another injection.  Sometimes when gets up off bed when having therapy, feels dizzy and has to take a few moments to get back to normal.  Has had same therapist Fritz Pickerel since his neck surgery.      Review of Systems:  Review of Systems  Constitutional: Negative for fever and malaise/fatigue.  Eyes: Negative for blurred vision.       Glasses  Respiratory: Negative for shortness of breath.   Cardiovascular: Negative for chest pain.  Gastrointestinal: Negative for  constipation, blood in stool and melena.  Musculoskeletal: Positive for back pain. Negative for falls.       Left arm weakness, decreased rom and pain persist  Neurological: Positive for dizziness. Negative for weakness and headaches.  Psychiatric/Behavioral: Negative for depression and memory loss. The patient does not have insomnia.      Past Medical History  Diagnosis Date  . Hypertension   . DVT (deep venous thrombosis)   . PAF (paroxysmal atrial fibrillation)     a. 01/2011 Echo: EF 50-55%, Gr 1 DD, Mild AI/MR/TR.  Marland Kitchen Pneumonia 03/06/2003  . Arthritis   . Anxiety   . DDD (degenerative disc disease), cervical   . Depressive disorder, not elsewhere classified   . Depressive disorder, not elsewhere classified   . Thoracic or lumbosacral neuritis or radiculitis, unspecified   . Pain in limb   . Impacted cerumen   . Family history of other cardiovascular diseases(V17.49)   . Mixed hyperlipidemia   . Atrial fibrillation   . Spinal stenosis, unspecified region other than cervical   . Edema   . Palpitations   . Other myelopathy   . Other dyspnea and respiratory abnormality   . Dysphagia, oral phase   . Hypoxemia   . Squamous cell carcinoma of skin of trunk, except scrotum     Past Surgical  History  Procedure Laterality Date  . History of abd ultrasound  11/01    fatty liver  . Cervical fusion  02/10/2011    Social History:   reports that he has never smoked. He has never used smokeless tobacco. He reports that he does not drink alcohol or use illicit drugs.  Family History  Problem Relation Age of Onset  . Stroke Father   . Atrial fibrillation Brother     on coumadin  . Heart disease Brother     AFib- coumadin  . Stroke Paternal Grandfather   . Cancer Other     colon cancer at early age    Medications: Patient's Medications  New Prescriptions   No medications on file  Previous Medications   CHOLECALCIFEROL (VITAMIN D) 1000 UNITS CAPSULE    Take one tablet once  daily   GABAPENTIN (NEURONTIN) 300 MG CAPSULE    Take 300 mg by mouth 3 (three) times daily. For nerve pain in Back   LEVOCETIRIZINE (XYZAL) 5 MG TABLET    Take 5 mg by mouth every evening. For Allergies   METOPROLOL (LOPRESSOR) 100 MG TABLET    Take one tablet by mouth twice daily for heart   PRAVASTATIN (PRAVACHOL) 20 MG TABLET    Take one tablet once at bedtime for cholesterol   TRAMADOL (ULTRAM) 50 MG TABLET    Take 50 mg by mouth 2 (two) times daily as needed. For Back Pain  Modified Medications   No medications on file  Discontinued Medications   No medications on file     Physical Exam: Filed Vitals:   09/15/14 0943  BP: 132/84  Pulse: 79  Temp: 97.7 F (36.5 C)  TempSrc: Oral  Resp: 10  Weight: 165 lb (74.844 kg)  SpO2: 96%  Physical Exam  Constitutional: He is oriented to person, place, and time. He appears well-developed and well-nourished. No distress.  HENT:  Head: Normocephalic and atraumatic.  Cardiovascular: Normal rate, regular rhythm, normal heart sounds and intact distal pulses.   Pulmonary/Chest: Effort normal and breath sounds normal. No respiratory distress.  Abdominal: Soft. Bowel sounds are normal. He exhibits no distension and no mass. There is no tenderness.  Musculoskeletal:  ROM left arm limited--must abduct with help of right arm to support.  Has swollen area left upper arm.  Tenderness of upper lateral arm  Neurological: He is alert and oriented to person, place, and time.  Skin: Skin is warm and dry.  Psychiatric: He has a normal mood and affect.     Labs reviewed: Basic Metabolic Panel:  Recent Labs  10/03/13 1051 02/28/14 1042  NA 144 141  K 5.2 5.0  CL 102 101  CO2 23 23  GLUCOSE 85 90  BUN 17 17  CREATININE 1.01 1.00  CALCIUM 9.7 9.4   Liver Function Tests:  Recent Labs  02/28/14 1042  AST 35  ALT 37  ALKPHOS 43  BILITOT 0.7  PROT 7.0  CBC:  Recent Labs  10/03/13 1051 02/28/14 1042  WBC 6.0 5.4  NEUTROABS 3.6  2.9  HGB 14.6 15.5  HCT 43.6 45.0  MCV 95 93  PLT  --  217   Lipid Panel:  Recent Labs  10/03/13 1051 02/28/14 1042  HDL 30* 35*  LDLCALC 67 75  TRIG 205* 175*  CHOLHDL 4.6 4.1   Lab Results  Component Value Date   HGBA1C 5.5 03/22/2013   Assessment/Plan 1. HYPERTENSION, BENIGN ESSENTIAL -bp at goal, no changes needed - CBC  With differential/Platelet - Comprehensive metabolic panel  2. Paroxysmal atrial fibrillation -in sinus rhythm today, not on anticoagulation due to regular falls at one point related to his cervical myelopathy - CBC With differential/Platelet  3. Spondylosis, cervical, with myelopathy -s/p fusion  -doing much better since  4. Nontraumatic rupture of tendons of biceps (long head), left -f/u with Dr. Tamera Punt as planned, cont therapy (completes tomorrow) - CBC With differential/Platelet  5. Spinal stenosis, lumbar region, with neurogenic claudication -stable with current pain regimen, walks with cane due to pain in right leg  6. Hyperlipidemia, mixed - remains on pravachol - Hemoglobin A1c - Lipid panel  7. Need for prophylactic vaccination and inoculation against influenza Flu shot was given today  Labs/tests ordered:   Orders Placed This Encounter  Procedures  . CBC With differential/Platelet  . Comprehensive metabolic panel  . Hemoglobin A1c  . Lipid panel   Next appt:  4 mos with prevnar  Derald Lorge L. Laylonie Marzec, D.O. [facility] [address], [place] [address] Cell Phone (Mon-Fri 8am-5pm):  [phone] On Call:  [phone] & follow prompts after 5pm & weekends Office Phone:  [phone] Office Fax:  [phone]

## 2014-09-16 LAB — CBC WITH DIFFERENTIAL
Basophils Absolute: 0.1 10*3/uL (ref 0.0–0.2)
Basos: 1 %
Eos: 2 %
Eosinophils Absolute: 0.1 10*3/uL (ref 0.0–0.4)
HCT: 45.2 % (ref 37.5–51.0)
Hemoglobin: 15.5 g/dL (ref 12.6–17.7)
Immature Grans (Abs): 0 10*3/uL (ref 0.0–0.1)
Immature Granulocytes: 0 %
Lymphocytes Absolute: 2.3 10*3/uL (ref 0.7–3.1)
Lymphs: 43 %
MCH: 31.8 pg (ref 26.6–33.0)
MCHC: 34.3 g/dL (ref 31.5–35.7)
MCV: 93 fL (ref 79–97)
Monocytes Absolute: 0.4 10*3/uL (ref 0.1–0.9)
Monocytes: 7 %
Neutrophils Absolute: 2.6 10*3/uL (ref 1.4–7.0)
Neutrophils Relative %: 47 %
Platelets: 204 10*3/uL (ref 150–379)
RBC: 4.87 x10E6/uL (ref 4.14–5.80)
RDW: 13.1 % (ref 12.3–15.4)
WBC: 5.4 10*3/uL (ref 3.4–10.8)

## 2014-09-16 LAB — COMPREHENSIVE METABOLIC PANEL
ALT: 36 IU/L (ref 0–44)
AST: 34 IU/L (ref 0–40)
Albumin/Globulin Ratio: 1.6 (ref 1.1–2.5)
Albumin: 4.3 g/dL (ref 3.5–4.7)
Alkaline Phosphatase: 44 IU/L (ref 39–117)
BUN/Creatinine Ratio: 16 (ref 10–22)
BUN: 16 mg/dL (ref 8–27)
CO2: 24 mmol/L (ref 18–29)
Calcium: 9.3 mg/dL (ref 8.6–10.2)
Chloride: 100 mmol/L (ref 97–108)
Creatinine, Ser: 1 mg/dL (ref 0.76–1.27)
GFR calc Af Amer: 82 mL/min/{1.73_m2} (ref >59)
GFR calc non Af Amer: 71 mL/min/{1.73_m2} (ref >59)
Globulin, Total: 2.7 g/dL (ref 1.5–4.5)
Glucose: 91 mg/dL (ref 65–99)
Potassium: 4.4 mmol/L (ref 3.5–5.2)
Sodium: 140 mmol/L (ref 134–144)
Total Bilirubin: 0.6 mg/dL (ref 0.0–1.2)
Total Protein: 7 g/dL (ref 6.0–8.5)

## 2014-09-16 LAB — LIPID PANEL
Chol/HDL Ratio: 4.4 ratio units (ref 0.0–5.0)
Cholesterol, Total: 150 mg/dL (ref 100–199)
HDL: 34 mg/dL — ABNORMAL LOW (ref >39)
LDL Calculated: 59 mg/dL (ref 0–99)
Triglycerides: 284 mg/dL — ABNORMAL HIGH (ref 0–149)
VLDL Cholesterol Cal: 57 mg/dL — ABNORMAL HIGH (ref 5–40)

## 2014-09-16 LAB — HEMOGLOBIN A1C
Est. average glucose Bld gHb Est-mCnc: 117 mg/dL
Hgb A1c MFr Bld: 5.7 % — ABNORMAL HIGH (ref 4.8–5.6)

## 2014-10-08 ENCOUNTER — Other Ambulatory Visit: Payer: Self-pay | Admitting: *Deleted

## 2014-10-08 MED ORDER — GABAPENTIN 300 MG PO CAPS: 300.0000 mg | ORAL_CAPSULE | Freq: Three times a day (TID) | ORAL | Status: DC

## 2014-10-08 MED ORDER — METOPROLOL TARTRATE 100 MG PO TABS: ORAL_TABLET | ORAL | Status: DC

## 2014-10-08 NOTE — Telephone Encounter (Signed)
Patient requested to be faxed to [employer].  

## 2015-01-19 ENCOUNTER — Encounter: Payer: Self-pay | Admitting: Internal Medicine

## 2015-01-19 ENCOUNTER — Ambulatory Visit (INDEPENDENT_AMBULATORY_CARE_PROVIDER_SITE_OTHER): Payer: Medicare PPO | Admitting: Internal Medicine

## 2015-01-19 VITALS — BP 128/70 | HR 75 | Temp 97.8°F | Resp 18 | Ht 67.0 in | Wt 168.0 lb

## 2015-01-19 DIAGNOSIS — M4806 Spinal stenosis, lumbar region: Secondary | ICD-10-CM

## 2015-01-19 DIAGNOSIS — E782 Mixed hyperlipidemia: Secondary | ICD-10-CM

## 2015-01-19 DIAGNOSIS — M4712 Other spondylosis with myelopathy, cervical region: Secondary | ICD-10-CM

## 2015-01-19 DIAGNOSIS — R739 Hyperglycemia, unspecified: Secondary | ICD-10-CM

## 2015-01-19 DIAGNOSIS — Z23 Encounter for immunization: Secondary | ICD-10-CM

## 2015-01-19 DIAGNOSIS — M48062 Spinal stenosis, lumbar region with neurogenic claudication: Secondary | ICD-10-CM

## 2015-01-19 DIAGNOSIS — I5032 Chronic diastolic (congestive) heart failure: Secondary | ICD-10-CM | POA: Insufficient documentation

## 2015-01-19 DIAGNOSIS — I480 Paroxysmal atrial fibrillation: Secondary | ICD-10-CM

## 2015-01-19 DIAGNOSIS — I10 Essential (primary) hypertension: Secondary | ICD-10-CM

## 2015-01-19 DIAGNOSIS — I5033 Acute on chronic diastolic (congestive) heart failure: Secondary | ICD-10-CM | POA: Insufficient documentation

## 2015-01-19 DIAGNOSIS — K219 Gastro-esophageal reflux disease without esophagitis: Secondary | ICD-10-CM

## 2015-01-19 HISTORY — DX: Chronic diastolic (congestive) heart failure: I50.32

## 2015-01-19 MED ORDER — TRAMADOL HCL 50 MG PO TABS: 50.0000 mg | ORAL_TABLET | Freq: Two times a day (BID) | ORAL | Status: DC | PRN

## 2015-01-19 MED ORDER — ASPIRIN 81 MG PO TABS: 81.0000 mg | ORAL_TABLET | Freq: Every day | ORAL | Status: DC

## 2015-01-19 NOTE — Patient Instructions (Signed)
Zantac (ranitidine) over the counter can be used on an as needed basis for reflux.  Gastroesophageal Reflux Disease, Adult Gastroesophageal reflux disease (GERD) happens when acid from your stomach goes into your food pipe (esophagus). The acid can cause a burning feeling in your chest. Over time, the acid can make small holes (ulcers) in your food pipe.  HOME CARE  Ask your doctor for advice about:  Losing weight.  Quitting smoking.  Alcohol use.  Avoid foods and drinks that make your problems worse. You [place] want to avoid:  Caffeine and alcohol.  Chocolate.  Mints.  Garlic and onions.  Spicy foods.  Citrus fruits, such as oranges, lemons, or limes.  Foods that contain tomato, such as sauce, chili, salsa, and pizza.  Fried and fatty foods.  Avoid lying down for 3 hours before you go to bed or before you take a nap.  Eat small meals often, instead of large meals.  Wear loose-fitting clothing. Do not wear anything tight around your waist.  Raise (elevate) the head of your bed 6 to 8 inches with wood blocks. Using extra pillows does not help.  Only take medicines as told by your doctor.  Do not take aspirin or ibuprofen. GET HELP RIGHT AWAY IF:   You have pain in your arms, neck, jaw, teeth, or back.  Your pain gets worse or changes.  You feel sick to your stomach (nauseous), throw up (vomit), or sweat (diaphoresis).  You feel short of breath, or you pass out (faint).  Your throw up is green, yellow, black, or looks like coffee grounds or blood.  Your poop (stool) is red, bloody, or black. MAKE SURE YOU:   Understand these instructions.  Will watch your condition.  Will get help right away if you are not doing well or get worse. Document Released: 05/16/2008 Document Revised: 02/20/2012 Document Reviewed: 06/17/2011 [facility] Patient Information 2015 [place], [place]. This information is not intended to replace advice given to you by your health care  provider. Make sure you discuss any questions you have with your health care provider. Food Choices for Gastroesophageal Reflux Disease When you have gastroesophageal reflux disease (GERD), the foods you eat and your eating habits are very important. Choosing the right foods can help ease the discomfort of GERD. WHAT GENERAL GUIDELINES DO I NEED TO FOLLOW?  Choose fruits, vegetables, whole grains, low-fat dairy products, and low-fat meat, fish, and poultry.  Limit fats such as oils, salad dressings, butter, nuts, and avocado.  Keep a food diary to identify foods that cause symptoms.  Avoid foods that cause reflux. These [place] be different for different people.  Eat frequent small meals instead of three large meals each day.  Eat your meals slowly, in a relaxed setting.  Limit fried foods.  Cook foods using methods other than frying.  Avoid drinking alcohol.  Avoid drinking large amounts of liquids with your meals.  Avoid bending over or lying down until 2-3 hours after eating. WHAT FOODS ARE NOT RECOMMENDED? The following are some foods and drinks that Pretty worsen your symptoms: Vegetables Tomatoes. Tomato juice. Tomato and spaghetti sauce. Chili peppers. Onion and garlic. Horseradish. Fruits Oranges, grapefruit, and lemon (fruit and juice). Meats High-fat meats, fish, and poultry. This includes hot dogs, ribs, ham, sausage, salami, and bacon. Dairy Whole milk and chocolate milk. Sour cream. Cream. Butter. Ice cream. Cream cheese.  Beverages Coffee and tea, with or without caffeine. Carbonated beverages or energy drinks. Condiments Hot sauce. Barbecue sauce.  Sweets/Desserts  Chocolate and cocoa. Donuts. Peppermint and spearmint. Fats and Oils High-fat foods, including [place] fries and potato chips. Other Vinegar. Strong spices, such as black pepper, white pepper, red pepper, cayenne, curry powder, cloves, ginger, and chili powder. The items listed above [place] not be a  complete list of foods and beverages to avoid. Contact your dietitian for more information. Document Released: 11/28/2005 Document Revised: 12/03/2013 Document Reviewed: 10/02/2013 [facility] Patient Information 2015 [place], [place]. This information is not intended to replace advice given to you by your health care provider. Make sure you discuss any questions you have with your health care provider.

## 2015-01-19 NOTE — Progress Notes (Signed)
Patient ID: Eric [name], male   DOB: [birth year], 81 y.o.   MRN: [id number]   Location:  [facility]  Allergies  Allergen Reactions  . Morphine And Related Shortness Of Breath  . Percocet [Oxycodone-Acetaminophen] Shortness Of Breath  . Valium Shortness Of Breath    Chief Complaint  Patient presents with  . Medical Management of Chronic Issues    HPI: Patient is a 81 y.o. white male seen in the office today for med mgt chronic diseases.   Sounds sad.  Says he didn't get to play in the snow due to fall risk. He denies any depression.  I couldn't pull it out of him though his wife had concerns.    No falls since last visit.  Put up his own Christmas decorations and now decorated for valentine's day.  Also fed his own chickens, but not in the ice and snow.  Is starting to cough after eats meat.  Ex. Last night they had chicken tenders and wings.  He was ok until he went to bed, laid down and coughed and coughed.  Does eat near bedtime.  Not every time--if goes out to eat, more likely, about an hour or so after meal.  Tickles in the throat.  Says he knows it went down.  Denies any acid taste in his mouth.    Discussed at least baby asa to prevent stroke.  Used to be on coumadin but his cardiologist stopped it b/c he was falling all of the time.  Has very strong fh/o stroke.  Review of Systems:  Review of Systems  Constitutional: Negative for fever and chills.  HENT: Negative for congestion.   Eyes: Negative for blurred vision.  Respiratory: Negative for shortness of breath.   Cardiovascular: Negative for chest pain and leg swelling.  Gastrointestinal: Negative for abdominal pain, constipation, blood in stool and melena.  Genitourinary: Negative for dysuria.  Musculoskeletal: Positive for back pain and neck pain. Negative for myalgias and falls.  Skin: Negative for rash.  Neurological: Negative for dizziness.  Psychiatric/Behavioral: Negative for  depression and memory loss.     Past Medical History  Diagnosis Date  . Hypertension   . DVT (deep venous thrombosis)   . PAF (paroxysmal atrial fibrillation)     a. 01/2011 Echo: EF 50-55%, Gr 1 DD, Mild AI/MR/TR.  Marland Kitchen Pneumonia 03/06/2003  . Arthritis   . Anxiety   . DDD (degenerative disc disease), cervical   . Depressive disorder, not elsewhere classified   . Depressive disorder, not elsewhere classified   . Thoracic or lumbosacral neuritis or radiculitis, unspecified   . Pain in limb   . Impacted cerumen   . Family history of other cardiovascular diseases(V17.49)   . Mixed hyperlipidemia   . Atrial fibrillation   . Spinal stenosis, unspecified region other than cervical   . Edema   . Palpitations   . Other myelopathy   . Other dyspnea and respiratory abnormality   . Dysphagia, oral phase   . Hypoxemia   . Squamous cell carcinoma of skin of trunk, except scrotum     Past Surgical History  Procedure Laterality Date  . History of abd ultrasound  11/01    fatty liver  . Cervical fusion  02/10/2011    Social History:   reports that he has never smoked. He has never used smokeless tobacco. He reports that he does not drink alcohol or use illicit drugs.  Family History  Problem  Relation Age of Onset  . Stroke Father   . Atrial fibrillation Brother     on coumadin  . Heart disease Brother     AFib- coumadin  . Stroke Paternal Grandfather   . Cancer Other     colon cancer at early age    Medications: Patient's Medications  New Prescriptions   No medications on file  Previous Medications   CHOLECALCIFEROL (VITAMIN D) 1000 UNITS CAPSULE    Take one tablet once daily   GABAPENTIN (NEURONTIN) 300 MG CAPSULE    Take 1 capsule (300 mg total) by mouth 3 (three) times daily. For nerve pain in Back   LEVOCETIRIZINE (XYZAL) 5 MG TABLET    Take 5 mg by mouth every evening. For Allergies   METOPROLOL (LOPRESSOR) 100 MG TABLET    Take one tablet by mouth twice daily for heart     PRAVASTATIN (PRAVACHOL) 20 MG TABLET    Take one tablet once at bedtime for cholesterol  Modified Medications   Modified Medication Previous Medication   TRAMADOL (ULTRAM) 50 MG TABLET traMADol (ULTRAM) 50 MG tablet      Take 1 tablet (50 mg total) by mouth 2 (two) times daily as needed. For Back Pain    Take 50 mg by mouth 2 (two) times daily as needed. For Back Pain   TRAMADOL (ULTRAM) 50 MG TABLET traMADol (ULTRAM) 50 MG tablet      Take 1 tablet (50 mg total) by mouth 2 (two) times daily as needed. For Back Pain    Take 50 mg by mouth 2 (two) times daily as needed. For Back Pain  Discontinued Medications   No medications on file     Physical Exam: Filed Vitals:   01/19/15 0953  BP: 128/70  Pulse: 75  Temp: 97.8 F (36.6 C)  TempSrc: Oral  Resp: 18  Height: 5\' 7"  (1.702 m)  Weight: 168 lb (76.204 kg)  SpO2: 97%  Physical Exam  Constitutional: He is oriented to person, place, and time.  Cardiovascular: Intact distal pulses.   irreg irreg today  Pulmonary/Chest: Effort normal and breath sounds normal.  Abdominal: Bowel sounds are normal.  Musculoskeletal:  Ambulates with limp, uses cane  Neurological: He is alert and oriented to person, place, and time.  Skin: Skin is warm and dry.    Labs reviewed: Basic Metabolic Panel:  Recent Labs  02/28/14 1042 09/15/14 1025  NA 141 140  K 5.0 4.4  CL 101 100  CO2 23 24  GLUCOSE 90 91  BUN 17 16  CREATININE 1.00 1.00  CALCIUM 9.4 9.3   Liver Function Tests:  Recent Labs  02/28/14 1042 09/15/14 1025  AST 35 34  ALT 37 36  ALKPHOS 43 44  BILITOT 0.7 0.6  PROT 7.0 7.0   No results for input(s): LIPASE, AMYLASE in the last 8760 hours. No results for input(s): AMMONIA in the last 8760 hours. CBC:  Recent Labs  02/28/14 1042 09/15/14 1025  WBC 5.4 5.4  NEUTROABS 2.9 2.6  HGB 15.5 15.5  HCT 45.0 45.2  MCV 93 93  PLT 217 204   Lipid Panel:  Recent Labs  02/28/14 1042 09/15/14 1025  HDL 35* 34*   LDLCALC 75 59  TRIG 175* 284*  CHOLHDL 4.1 4.4   Lab Results  Component Value Date   HGBA1C 5.7* 09/15/2014     Assessment/Plan 1. Gastroesophageal reflux disease, esophagitis presence not specified -given instructions on foods to avoid and conservative measures to  take to decrease acid reflux -also advised he can try zantac prn for episodes - Basic metabolic panel  2. HYPERTENSION, BENIGN ESSENTIAL -bp at goal with current therapy, no changes  3. Hyperlipidemia, mixed -cont pravachol and f/u labs - Hemoglobin A1c - Lipid panel  4. Spinal stenosis, lumbar region, with neurogenic claudication -renewed tramadol, has been stable  5. Spondylosis, cervical, with myelopathy -much better after surgery, but still has weakness and needs cane due to #4  6. Paroxysmal atrial fibrillation -cardiology took off coumadin due to very frequent falls and great difficulty regulating INR -he agrees to low dose aspirin, but not other blood thinner agents at this time -rate is controlled - aspirin 81 MG tablet; Take 1 tablet (81 mg total) by mouth daily.  Dispense: 30 tablet; Refill: 3 -CHADSVASC:  4  7. Hyperglycemia - f/u labs - Hemoglobin A1c  8. Need for vaccination with 13-polyvalent pneumococcal conjugate vaccine -prevnar given  Labs/tests ordered:   Orders Placed This Encounter  Procedures  . Hemoglobin A1c  . Lipid panel    Order Specific Question:  Has the patient fasted?    Answer:  Yes  . Basic metabolic panel    Order Specific Question:  Has the patient fasted?    Answer:  Yes    Next appt:  4 mos  Ilena Dieckman L. Tajuana Kniskern, D.O. [facility] [address], [place] [address] Cell Phone (Mon-Fri 8am-5pm):  [phone] On Call:  [phone] & follow prompts after 5pm & weekends Office Phone:  [phone] Office Fax:  [phone]

## 2015-01-20 LAB — HEMOGLOBIN A1C
Est. average glucose Bld gHb Est-mCnc: 111 mg/dL
Hgb A1c MFr Bld: 5.5 % (ref 4.8–5.6)

## 2015-01-20 LAB — LIPID PANEL
Chol/HDL Ratio: 3.7 ratio units (ref 0.0–5.0)
Cholesterol, Total: 136 mg/dL (ref 100–199)
HDL: 37 mg/dL — ABNORMAL LOW (ref >39)
LDL Calculated: 73 mg/dL (ref 0–99)
Triglycerides: 129 mg/dL (ref 0–149)
VLDL Cholesterol Cal: 26 mg/dL (ref 5–40)

## 2015-01-20 LAB — BASIC METABOLIC PANEL
BUN/Creatinine Ratio: 22 (ref 10–22)
BUN: 21 mg/dL (ref 8–27)
CO2: 26 mmol/L (ref 18–29)
Calcium: 9.5 mg/dL (ref 8.6–10.2)
Chloride: 105 mmol/L (ref 97–108)
Creatinine, Ser: 0.95 mg/dL (ref 0.76–1.27)
GFR calc Af Amer: 87 mL/min/{1.73_m2} (ref >59)
GFR calc non Af Amer: 75 mL/min/{1.73_m2} (ref >59)
Glucose: 80 mg/dL (ref 65–99)
Potassium: 4.9 mmol/L (ref 3.5–5.2)
Sodium: 143 mmol/L (ref 134–144)

## 2015-03-10 ENCOUNTER — Telehealth: Payer: Self-pay | Admitting: *Deleted

## 2015-03-10 DIAGNOSIS — M48061 Spinal stenosis, lumbar region without neurogenic claudication: Secondary | ICD-10-CM

## 2015-03-10 NOTE — Telephone Encounter (Signed)
Patient called and stated that his legs have been going to sleep and wants to know if we will refer him to Dr. Mina Marble (neurologist) or does he need to be seen here first? You don't have anything available for Thursday. Please Advise.

## 2015-03-10 NOTE — Telephone Encounter (Signed)
We can place the neurology referral to Dr. Mina Marble.  I suspect this is due to his spinal stenosis progressing.

## 2015-03-10 NOTE — Telephone Encounter (Signed)
Patient stated neurology but meant Orthopedic. So referral placed for Orthopedic Dr. Mina Marble at [facility] #: [phone]

## 2015-03-15 ENCOUNTER — Encounter: Payer: Self-pay | Admitting: Internal Medicine

## 2015-04-09 ENCOUNTER — Other Ambulatory Visit: Payer: Self-pay | Admitting: *Deleted

## 2015-04-09 MED ORDER — GABAPENTIN 300 MG PO CAPS: 300.0000 mg | ORAL_CAPSULE | Freq: Three times a day (TID) | ORAL | Status: DC

## 2015-04-09 NOTE — Telephone Encounter (Signed)
Patient requested to be faxed to [employer].  

## 2015-05-13 ENCOUNTER — Encounter: Payer: Self-pay | Admitting: Cardiovascular Disease

## 2015-05-13 ENCOUNTER — Ambulatory Visit (INDEPENDENT_AMBULATORY_CARE_PROVIDER_SITE_OTHER): Payer: Medicare PPO | Admitting: Cardiovascular Disease

## 2015-05-13 ENCOUNTER — Encounter (INDEPENDENT_AMBULATORY_CARE_PROVIDER_SITE_OTHER): Payer: Self-pay

## 2015-05-13 VITALS — BP 150/84 | HR 75 | Ht 66.0 in | Wt 167.5 lb

## 2015-05-13 DIAGNOSIS — I480 Paroxysmal atrial fibrillation: Secondary | ICD-10-CM | POA: Diagnosis not present

## 2015-05-13 DIAGNOSIS — E785 Hyperlipidemia, unspecified: Secondary | ICD-10-CM | POA: Diagnosis not present

## 2015-05-13 DIAGNOSIS — I10 Essential (primary) hypertension: Secondary | ICD-10-CM | POA: Diagnosis not present

## 2015-05-13 MED ORDER — PRAVASTATIN SODIUM 20 MG PO TABS: ORAL_TABLET | ORAL | Status: DC

## 2015-05-13 MED ORDER — METOPROLOL TARTRATE 100 MG PO TABS: ORAL_TABLET | ORAL | Status: DC

## 2015-05-13 NOTE — Assessment & Plan Note (Signed)
Maintaining normal sinus rhythm. No medication changes made 

## 2015-05-13 NOTE — Assessment & Plan Note (Signed)
Cholesterol is at goal on the current lipid regimen. No changes to the medications were made.  

## 2015-05-13 NOTE — Assessment & Plan Note (Signed)
Blood pressure is well controlled on today's visit. No changes made to the medications. 

## 2015-05-13 NOTE — Progress Notes (Signed)
Patient ID: Eric [name], male    DOB: Sep 30, 1932, 81 y.o.   MRN: [id number]  HPI Comments: Mr. Veach is a very pleasant 81 year old gentleman with a history of atrial fibrillation, frequent PVCs, who  has been maintaining sinus rhythm, with severe neck disease and stenosis who underwent  cervical and thoracic laminectomy and fusion with instrumentation at the beginning of March 2012 with postoperative complications including aspiration requiring a feeding tube , atrial fibrillation, who presents for routine followup of his atrial fibrillation. 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  He denies any tachycardia or palpitations concerning for atrial fibrillation or arrhythmia. He has been taking his metoprolol only in the morning Denies any chest pain concerning for angina. Exercise limited by leg and back pain.  walking sometimes without a cane. High fall risk  restless leg at nighttime. He reports having a limited range of motion of his left shoulder, unable to raise his left arm above his shoulder height. Denies any trauma to the left arm  EKG on today's visit shows normal sinus rhythm with rate 75 bpm, no significant ST or T-wave changes  Other past medical history Previous visit for  cortisone shot for his back  was put on hold secondary to ectopy .   In followup in our clinic, 30 day Holter monitor was ordered.This has shown frequent PVCs, occasional couplets, triplets. Very rare episodes of dizziness.  Most recent lab work every 2016 showing total cholesterol 136, LDL 73  Allergies  Allergen Reactions  . Morphine And Related Shortness Of Breath  . Percocet [Oxycodone-Acetaminophen] Shortness Of Breath  . Valium Shortness Of Breath    Current Outpatient Prescriptions on File Prior to Visit  Medication Sig Dispense Refill  . aspirin 81 MG tablet Take 1 tablet (81 mg total) by mouth daily. 30 tablet 3  . Cholecalciferol (VITAMIN D) 1000  UNITS capsule Take one tablet once daily 90 capsule 3  . gabapentin (NEURONTIN) 300 MG capsule Take 1 capsule (300 mg total) by mouth 3 (three) times daily. For nerve pain in Back 270 capsule 3  . levocetirizine (XYZAL) 5 MG tablet Take 5 mg by mouth every evening. For Allergies    . traMADol (ULTRAM) 50 MG tablet Take 1 tablet (50 mg total) by mouth 2 (two) times daily as needed. For Back Pain 30 tablet 0   No current facility-administered medications on file prior to visit.    Past Medical History  Diagnosis Date  . Hypertension   . DVT (deep venous thrombosis)   . PAF (paroxysmal atrial fibrillation)     a. 01/2011 Echo: EF 50-55%, Gr 1 DD, Mild AI/MR/TR.  Marland Kitchen Pneumonia 03/06/2003  . Arthritis   . Anxiety   . DDD (degenerative disc disease), cervical   . Depressive disorder, not elsewhere classified   . Depressive disorder, not elsewhere classified   . Thoracic or lumbosacral neuritis or radiculitis, unspecified   . Pain in limb   . Impacted cerumen   . Family history of other cardiovascular diseases(V17.49)   . Mixed hyperlipidemia   . Atrial fibrillation   . Spinal stenosis, unspecified region other than cervical   . Edema   . Palpitations   . Other myelopathy   . Other dyspnea and respiratory abnormality   . Dysphagia, oral phase   . Hypoxemia   . Squamous cell carcinoma of skin of trunk, except scrotum   . Chronic diastolic CHF (congestive  heart failure) 01/19/2015    Past Surgical History  Procedure Laterality Date  . History of abd ultrasound  11/01    fatty liver  . Cervical fusion  02/10/2011    Social History  reports that he has never smoked. He has never used smokeless tobacco. He reports that he does not drink alcohol or use illicit drugs.  Family History family history includes Atrial fibrillation in his brother; Cancer in his other; Heart disease in his brother; Stroke in his father and paternal grandfather.   Review of Systems  Constitutional: Negative.    HENT:       Well-healed posterior surgical site on his neck  Respiratory: Negative.   Cardiovascular: Negative.   Gastrointestinal: Negative.   Musculoskeletal: Positive for back pain, gait problem and neck stiffness.  Skin: Negative.   Neurological: Negative.   Hematological: Negative.   Psychiatric/Behavioral: Negative.   All other systems reviewed and are negative.   BP 150/84 mmHg  Pulse 75  Ht 5\' 6"  (1.676 m)  Wt 167 lb 8 oz (75.978 kg)  BMI 27.05 kg/m2  Physical Exam  Constitutional: He is oriented to person, place, and time. He appears well-developed and well-nourished.  HENT:  Head: Normocephalic.  Nose: Nose normal.  Mouth/Throat: Oropharynx is clear and moist.  Eyes: Conjunctivae are normal. Pupils are equal, round, and reactive to light.  Neck: Normal range of motion. Neck supple. No JVD present.  Cardiovascular: Normal rate, regular rhythm, S1 normal, S2 normal, normal heart sounds and intact distal pulses.  Exam reveals no gallop and no friction rub.   No murmur heard. Pulmonary/Chest: Effort normal and breath sounds normal. No respiratory distress. He has no wheezes. He has no rales. He exhibits no tenderness.  Abdominal: Soft. Bowel sounds are normal. He exhibits no distension. There is no tenderness.  Musculoskeletal: Normal range of motion. He exhibits no edema or tenderness.  Decreased range of motion of the neck and upper extremities  Lymphadenopathy:    He has no cervical adenopathy.  Neurological: He is alert and oriented to person, place, and time. Coordination normal.  Skin: Skin is warm and dry. No rash noted. No erythema.  Psychiatric: He has a normal mood and affect. His behavior is normal. Judgment and thought content normal.      Assessment and Plan   Nursing note and vitals reviewed.

## 2015-05-13 NOTE — Patient Instructions (Signed)
You are doing well. No medication changes were made.  Please call us if you have new issues that need to be addressed before your next appt.  Your physician wants you to follow-up in: 12 months.  You will receive a reminder letter in the mail two months in advance. If you don't receive a letter, please call our office to schedule the follow-up appointment. 

## 2015-06-01 ENCOUNTER — Ambulatory Visit (INDEPENDENT_AMBULATORY_CARE_PROVIDER_SITE_OTHER): Payer: Medicare PPO | Admitting: Internal Medicine

## 2015-06-01 ENCOUNTER — Encounter: Payer: Self-pay | Admitting: Internal Medicine

## 2015-06-01 VITALS — BP 118/76 | HR 76 | Temp 97.9°F | Resp 20 | Ht 66.0 in | Wt 168.6 lb

## 2015-06-01 DIAGNOSIS — M7989 Other specified soft tissue disorders: Secondary | ICD-10-CM | POA: Diagnosis not present

## 2015-06-01 DIAGNOSIS — M4806 Spinal stenosis, lumbar region: Secondary | ICD-10-CM

## 2015-06-01 DIAGNOSIS — E782 Mixed hyperlipidemia: Secondary | ICD-10-CM

## 2015-06-01 DIAGNOSIS — M48062 Spinal stenosis, lumbar region with neurogenic claudication: Secondary | ICD-10-CM

## 2015-06-01 DIAGNOSIS — R739 Hyperglycemia, unspecified: Secondary | ICD-10-CM

## 2015-06-01 DIAGNOSIS — I480 Paroxysmal atrial fibrillation: Secondary | ICD-10-CM

## 2015-06-01 DIAGNOSIS — I10 Essential (primary) hypertension: Secondary | ICD-10-CM | POA: Diagnosis not present

## 2015-06-01 DIAGNOSIS — M4712 Other spondylosis with myelopathy, cervical region: Secondary | ICD-10-CM

## 2015-06-01 NOTE — Progress Notes (Signed)
Patient ID: Eric [name], male   DOB: 12-Jul-1932, 81 y.o.   MRN: [id number]   Location:  [facility] / [facility]  Goals of Care: Advanced Directive information Does patient have an advance directive?: Yes, Type of Advance Directive: [place];Living will, Does patient want to make changes to advanced directive?: No - Patient declined   Chief Complaint  Patient presents with  . Medical Management of Chronic Issues    4 month follow-up    HPI: Patient is a 81 y.o.  White male seen in the office today for med mgt of chronic diseases.  His heart doctor turned him loose 6/1 for a year.   Legs are swelling.  Left more than right.  Not short of breath.  Just cut grass, trimmed bushes, etc. W/o a problem.  No injury to the leg.    Normally the right leg hurts him due to his back pain and sciatica.  Hurts when it's cold.  Walks with cane.  Review of Systems:  Review of Systems  Constitutional: Negative for fever, chills and malaise/fatigue.  HENT: Positive for hearing loss. Negative for congestion.   Eyes: Negative for blurred vision.  Respiratory: Negative for shortness of breath.   Cardiovascular: Positive for leg swelling. Negative for chest pain.       Left leg mildly swollen and larger than the other  Gastrointestinal: Negative for abdominal pain.  Genitourinary: Negative for dysuria, urgency and frequency.  Musculoskeletal: Positive for back pain. Negative for falls.  Skin: Negative for itching and rash.  Neurological: Negative for dizziness and weakness.  Psychiatric/Behavioral: Negative for depression and memory loss.    Past Medical History  Diagnosis Date  . Hypertension   . DVT (deep venous thrombosis)   . PAF (paroxysmal atrial fibrillation)     a. 01/2011 Echo: EF 50-55%, Gr 1 DD, Mild AI/MR/TR.  Marland Kitchen Pneumonia 03/06/2003  . Arthritis   . Anxiety   . DDD (degenerative disc disease), cervical   . Depressive disorder, not  elsewhere classified   . Depressive disorder, not elsewhere classified   . Thoracic or lumbosacral neuritis or radiculitis, unspecified   . Pain in limb   . Impacted cerumen   . Family history of other cardiovascular diseases(V17.49)   . Mixed hyperlipidemia   . Atrial fibrillation   . Spinal stenosis, unspecified region other than cervical   . Edema   . Palpitations   . Other myelopathy   . Other dyspnea and respiratory abnormality   . Dysphagia, oral phase   . Hypoxemia   . Squamous cell carcinoma of skin of trunk, except scrotum   . Chronic diastolic CHF (congestive heart failure) 01/19/2015    Past Surgical History  Procedure Laterality Date  . History of abd ultrasound  11/01    fatty liver  . Cervical fusion  02/10/2011    Allergies  Allergen Reactions  . Morphine And Related Shortness Of Breath  . Percocet [Oxycodone-Acetaminophen] Shortness Of Breath  . Valium Shortness Of Breath   Medications: Patient's Medications  New Prescriptions   No medications on file  Previous Medications   ASPIRIN 81 MG TABLET    Take 1 tablet (81 mg total) by mouth daily.   CHOLECALCIFEROL (VITAMIN D) 1000 UNITS CAPSULE    Take one tablet once daily   GABAPENTIN (NEURONTIN) 300 MG CAPSULE    Take 1 capsule (300 mg total) by mouth 3 (three) times daily. For nerve pain in Back  LEVOCETIRIZINE (XYZAL) 5 MG TABLET    Take 5 mg by mouth every evening. For Allergies   METOPROLOL (LOPRESSOR) 100 MG TABLET    Take one tablet by mouth twice daily for heart   PRAVASTATIN (PRAVACHOL) 20 MG TABLET    Take one tablet once at bedtime for cholesterol   TRAMADOL (ULTRAM) 50 MG TABLET    Take 1 tablet (50 mg total) by mouth 2 (two) times daily as needed. For Back Pain  Modified Medications   No medications on file  Discontinued Medications   No medications on file    Physical Exam: Filed Vitals:   06/01/15 1050  BP: 118/76  Pulse: 76  Temp: 97.9 F (36.6 C)  TempSrc: Oral  Resp: 20  Height:  5\' 6"  (1.676 m)  Weight: 168 lb 9.6 oz (76.476 kg)  SpO2: 98%   Physical Exam  Constitutional: He is oriented to person, place, and time.  Cardiovascular: Normal rate, regular rhythm, normal heart sounds and intact distal pulses.   Left leg swollen larger than right--this is new  Pulmonary/Chest: Effort normal and breath sounds normal.  Abdominal: Soft. Bowel sounds are normal. He exhibits no distension. There is no tenderness.  Musculoskeletal:  Walks with limp; uses cane  Neurological: He is alert and oriented to person, place, and time.  Skin: Skin is warm and dry.  Psychiatric: He has a normal mood and affect. His behavior is normal. Judgment and thought content normal.    Labs reviewed: Basic Metabolic Panel:  Recent Labs  09/15/14 1025 01/19/15 1114  NA 140 143  K 4.4 4.9  CL 100 105  CO2 24 26  GLUCOSE 91 80  BUN 16 21  CREATININE 1.00 0.95  CALCIUM 9.3 9.5   Liver Function Tests:  Recent Labs  09/15/14 1025  AST 34  ALT 36  ALKPHOS 44  BILITOT 0.6  PROT 7.0   No results for input(s): LIPASE, AMYLASE in the last 8760 hours. No results for input(s): AMMONIA in the last 8760 hours. CBC:  Recent Labs  09/15/14 1025  WBC 5.4  NEUTROABS 2.6  HGB 15.5  HCT 45.2  MCV 93  PLT 204   Lipid Panel:  Recent Labs  09/15/14 1025 01/19/15 1114  CHOL 150 136  HDL 34* 37*  LDLCALC 59 73  TRIG 284* 129  CHOLHDL 4.4 3.7   Lab Results  Component Value Date   HGBA1C 5.5 01/19/2015    Assessment/Plan 1. Left leg swelling -new onset, nonpainful, pulses intact, no erythema, warmth -will r/o DVT with venous doppler of left LE - Ultrasound doppler venous legs bilat; Future  2. HYPERTENSION, BENIGN ESSENTIAL -bp is at goal with current therapy, no changes  3. Spinal stenosis, lumbar region, with neurogenic claudication -cont current pain regimen with tramadol and gabapentin, is s/p surgery with some improvement, but still has pain in right leg worse  when it's cold outside  4. Hyperlipidemia, mixed -cont pravachol - Basic metabolic panel  5. Spondylosis, cervical, with myelopathy -cont gabapentin for pain, doing well and able to work outside lately w/o problems  6. Paroxysmal atrial fibrillation -cont metoprolol for rate control, baby asa for stroke prevention; pt and family have opted to avoid other anticoagulants due to falling  7. Hyperglycemia -cont to watch sweets and very starch foods and f/u labs at a future visit - Basic metabolic panel  Labs/tests ordered: Orders Placed This Encounter  Procedures  . Basic metabolic panel  venous doppler  Next appt:  3 mos  Stefannie Defeo L. Devine Klingel, D.O. [facility] [address], [place] [address] Cell Phone (Mon-Fri 8am-5pm):  [phone] On Call:  [phone] & follow prompts after 5pm & weekends Office Phone:  [phone] Office Fax:  [phone]

## 2015-06-02 LAB — BASIC METABOLIC PANEL
BUN/Creatinine Ratio: 22 (ref 10–22)
BUN: 17 mg/dL (ref 8–27)
CO2: 23 mmol/L (ref 18–29)
Calcium: 9 mg/dL (ref 8.6–10.2)
Chloride: 100 mmol/L (ref 97–108)
Creatinine, Ser: 0.78 mg/dL (ref 0.76–1.27)
GFR calc Af Amer: 98 mL/min/{1.73_m2} (ref >59)
GFR calc non Af Amer: 85 mL/min/{1.73_m2} (ref >59)
Glucose: 90 mg/dL (ref 65–99)
Potassium: 4.3 mmol/L (ref 3.5–5.2)
Sodium: 138 mmol/L (ref 134–144)

## 2015-06-02 NOTE — Addendum Note (Signed)
Addended by: Rafael Bihari A on: 06/02/2015 11:48 AM   Modules accepted: Orders

## 2015-06-05 ENCOUNTER — Ambulatory Visit (HOSPITAL_COMMUNITY)
Admission: RE | Admit: 2015-06-05 | Discharge: 2015-06-05 | Disposition: A | Discharge status: Home / Self Care | Payer: Medicare PPO | Source: Ambulatory Visit | Attending: Vascular Surgery | Admitting: Vascular Surgery

## 2015-06-05 DIAGNOSIS — M7989 Other specified soft tissue disorders: Secondary | ICD-10-CM | POA: Insufficient documentation

## 2015-09-03 ENCOUNTER — Other Ambulatory Visit: Payer: Self-pay | Admitting: *Deleted

## 2015-09-03 MED ORDER — GABAPENTIN 300 MG PO CAPS: 300.0000 mg | ORAL_CAPSULE | Freq: Three times a day (TID) | ORAL | Status: DC

## 2015-09-03 NOTE — Telephone Encounter (Signed)
Patient requested to be sent to [facility] for refill.

## 2015-09-04 ENCOUNTER — Ambulatory Visit (INDEPENDENT_AMBULATORY_CARE_PROVIDER_SITE_OTHER): Payer: Medicare PPO | Admitting: Internal Medicine

## 2015-09-04 ENCOUNTER — Encounter: Payer: Self-pay | Admitting: Internal Medicine

## 2015-09-04 VITALS — BP 122/78 | HR 74 | Temp 97.6°F | Resp 12 | Ht 66.0 in | Wt 169.0 lb

## 2015-09-04 DIAGNOSIS — E782 Mixed hyperlipidemia: Secondary | ICD-10-CM

## 2015-09-04 DIAGNOSIS — I5032 Chronic diastolic (congestive) heart failure: Secondary | ICD-10-CM | POA: Diagnosis not present

## 2015-09-04 DIAGNOSIS — D638 Anemia in other chronic diseases classified elsewhere: Secondary | ICD-10-CM

## 2015-09-04 DIAGNOSIS — I10 Essential (primary) hypertension: Secondary | ICD-10-CM | POA: Diagnosis not present

## 2015-09-04 DIAGNOSIS — M48062 Spinal stenosis, lumbar region with neurogenic claudication: Secondary | ICD-10-CM

## 2015-09-04 DIAGNOSIS — R350 Frequency of micturition: Secondary | ICD-10-CM

## 2015-09-04 DIAGNOSIS — I480 Paroxysmal atrial fibrillation: Secondary | ICD-10-CM | POA: Diagnosis not present

## 2015-09-04 DIAGNOSIS — M5412 Radiculopathy, cervical region: Secondary | ICD-10-CM

## 2015-09-04 DIAGNOSIS — Z23 Encounter for immunization: Secondary | ICD-10-CM

## 2015-09-04 DIAGNOSIS — M4712 Other spondylosis with myelopathy, cervical region: Secondary | ICD-10-CM | POA: Diagnosis not present

## 2015-09-04 DIAGNOSIS — M4806 Spinal stenosis, lumbar region: Secondary | ICD-10-CM | POA: Diagnosis not present

## 2015-09-04 NOTE — Progress Notes (Signed)
Patient ID: Eric [name], male   DOB: 01-23-1932, 81 y.o.   MRN: [id number]   Location:  [facility] / [facility]  Code Status: DNR Goals of Care: Advanced Directive information Does patient have an advance directive?: Yes, Type of Advance Directive: [place];Living will;Out of facility DNR (pink MOST or yellow form), Does patient want to make changes to advanced directive?: No - Patient declined   Chief Complaint  Patient presents with  . Medical Management of Chronic Issues    3 month follow-up, no recent labs     HPI: Patient is a 81 y.o. white male seen in the office today for med mgt of chronic diseases. Has been 4 years since his neck surgery.   No falls recently. Pain is pretty well controlled since the shot in his lower back. Still radiates down right leg.  Walks with cane. Still has pain in the first leg.  Now right ankle is wanting to swell.  Hurts like something is injured down there.  Isn't bad enough to be broken.   Needs labs:  Sugar, lipids, psa Has not had to get up at night to urinate.  Thinks his psa will be ok, but he wants to know.   Has been doing what he wants to do in the yard when the children let him.  He's looking after his grandchildren some also.    Review of Systems:  Review of Systems  Constitutional: Negative for fever, chills and malaise/fatigue.  HENT: Negative for congestion.   Eyes: Negative for blurred vision.  Respiratory: Negative for shortness of breath.   Cardiovascular: Positive for leg swelling. Negative for chest pain and palpitations.       Right ankle off and on  Gastrointestinal: Negative for abdominal pain, constipation, blood in stool and melena.  Genitourinary: Positive for frequency. Negative for dysuria and urgency.  Musculoskeletal: Positive for back pain. Negative for myalgias and falls.       Walks with cane  Skin: Negative for rash.  Neurological: Positive for tingling and sensory  change. Negative for weakness.       Right leg  Endo/Heme/Allergies: Bruises/bleeds easily.  Psychiatric/Behavioral: Negative for depression and memory loss.    Past Medical History  Diagnosis Date  . Hypertension   . DVT (deep venous thrombosis)   . PAF (paroxysmal atrial fibrillation)     a. 01/2011 Echo: EF 50-55%, Gr 1 DD, Mild AI/MR/TR.  Marland Kitchen Pneumonia 03/06/2003  . Arthritis   . Anxiety   . DDD (degenerative disc disease), cervical   . Depressive disorder, not elsewhere classified   . Depressive disorder, not elsewhere classified   . Thoracic or lumbosacral neuritis or radiculitis, unspecified   . Pain in limb   . Impacted cerumen   . Family history of other cardiovascular diseases(V17.49)   . Mixed hyperlipidemia   . Atrial fibrillation   . Spinal stenosis, unspecified region other than cervical   . Edema   . Palpitations   . Other myelopathy   . Other dyspnea and respiratory abnormality   . Dysphagia, oral phase   . Hypoxemia   . Squamous cell carcinoma of skin of trunk, except scrotum   . Chronic diastolic CHF (congestive heart failure) 01/19/2015    Past Surgical History  Procedure Laterality Date  . History of abd ultrasound  11/01    fatty liver  . Cervical fusion  02/10/2011    Allergies  Allergen Reactions  . Morphine And  Related Shortness Of Breath  . Percocet [Oxycodone-Acetaminophen] Shortness Of Breath  . Valium Shortness Of Breath   Medications: Patient's Medications  New Prescriptions   No medications on file  Previous Medications   ASPIRIN 81 MG TABLET    Take 1 tablet (81 mg total) by mouth daily.   CHOLECALCIFEROL (VITAMIN D) 1000 UNITS CAPSULE    Take one tablet once daily   GABAPENTIN (NEURONTIN) 300 MG CAPSULE    Take 1 capsule (300 mg total) by mouth 3 (three) times daily. For nerve pain in Back   LEVOCETIRIZINE (XYZAL) 5 MG TABLET    Take 5 mg by mouth every evening. For Allergies   METOPROLOL (LOPRESSOR) 100 MG TABLET    Take one tablet by  mouth twice daily for heart   PRAVASTATIN (PRAVACHOL) 20 MG TABLET    Take one tablet once at bedtime for cholesterol   TRAMADOL (ULTRAM) 50 MG TABLET    Take 1 tablet (50 mg total) by mouth 2 (two) times daily as needed. For Back Pain  Modified Medications   No medications on file  Discontinued Medications   No medications on file    Physical Exam: Filed Vitals:   09/04/15 0955  BP: 122/78  Pulse: 74  Temp: 97.6 F (36.4 C)  TempSrc: Oral  Resp: 12  Height: 5\' 6"  (1.676 m)  Weight: 169 lb (76.658 kg)  SpO2: 95%   Physical Exam  Constitutional: He appears well-developed and well-nourished. No distress.  HENT:  Head: Normocephalic and atraumatic.  Eyes:  Glasses; has erythematous patches on upper lids with some scaling and small skin tag on right upper lid  Cardiovascular: Intact distal pulses.   irreg irreg today  Pulmonary/Chest: Effort normal and breath sounds normal. He has no wheezes. He has no rales.  Abdominal: Bowel sounds are normal.  Musculoskeletal: He exhibits no edema.  Walks with cane with limp  Skin: Skin is warm and dry.  See eyes  Psychiatric: He has a normal mood and affect. His behavior is normal. Judgment and thought content normal.    Labs reviewed: Basic Metabolic Panel:  Recent Labs  09/15/14 1025 01/19/15 1114 06/01/15 1140  NA 140 143 138  K 4.4 4.9 4.3  CL 100 105 100  CO2 24 26 23   GLUCOSE 91 80 90  BUN 16 21 17   CREATININE 1.00 0.95 0.78  CALCIUM 9.3 9.5 9.0   Liver Function Tests:  Recent Labs  09/15/14 1025  AST 34  ALT 36  ALKPHOS 44  BILITOT 0.6  PROT 7.0   No results for input(s): LIPASE, AMYLASE in the last 8760 hours. No results for input(s): AMMONIA in the last 8760 hours. CBC:  Recent Labs  09/15/14 1025  WBC 5.4  NEUTROABS 2.6  HGB 15.5  HCT 45.2  MCV 93  PLT 204   Lipid Panel:  Recent Labs  09/15/14 1025 01/19/15 1114  CHOL 150 136  HDL 34* 37*  LDLCALC 59 73  TRIG 284* 129  CHOLHDL 4.4  3.7   Lab Results  Component Value Date   HGBA1C 5.5 01/19/2015   Assessment/Plan 1. Hyperlipidemia, mixed -cont to watch starchy foods -remains active in the yard -has not tolerated most of the cholesterol meds  2. Anemia of chronic disease - cont to monitor  3. Spinal stenosis, lumbar region, with neurogenic claudication -cont use of cane -improved since back injection -seems right leg pain into ankle is related to this and intermittent edema  4. Cervical myelopathy  with cervical radiculopathy -much better since surgery  5. HYPERTENSION, BENIGN ESSENTIAL -bp controlled with current therapy, no changes  6. Chronic diastolic CHF (congestive heart failure) -stable, continues metoprolol, asa  7. PAF (paroxysmal atrial fibrillation) -rate controlled, was in afib today, cont asa 81mg  daily due to falling  8. Need for influenza vaccination - Flu Vaccine QUAD 36+ mos PF IM (Fluarix & Fluzone Quad PF)  Labs/tests ordered:    Orders Placed This Encounter  Procedures  . Flu Vaccine QUAD 36+ mos PF IM (Fluarix & Fluzone Quad PF)  . CBC with Differential/Platelet  . Comprehensive metabolic panel    Order Specific Question:  Has the patient fasted?    Answer:  Yes  . Lipid panel    Order Specific Question:  Has the patient fasted?    Answer:  Yes  . Hemoglobin A1c  . PSA    Next appt:  3 mos for annual exam  Hassaan Crite L. Rasool Rommel, D.O. [facility] [address], [place] [address] Cell Phone (Mon-Fri 8am-5pm):  [phone] On Call:  [phone] & follow prompts after 5pm & weekends Office Phone:  [phone] Office Fax:  [phone]

## 2015-09-05 LAB — LIPID PANEL
Chol/HDL Ratio: 4.6 ratio units (ref 0.0–5.0)
Cholesterol, Total: 144 mg/dL (ref 100–199)
HDL: 31 mg/dL — ABNORMAL LOW (ref >39)
LDL Calculated: 62 mg/dL (ref 0–99)
Triglycerides: 255 mg/dL — ABNORMAL HIGH (ref 0–149)
VLDL Cholesterol Cal: 51 mg/dL — ABNORMAL HIGH (ref 5–40)

## 2015-09-05 LAB — CBC WITH DIFFERENTIAL/PLATELET
Basophils Absolute: 0 10*3/uL (ref 0.0–0.2)
Basos: 1 %
EOS (ABSOLUTE): 0.1 10*3/uL (ref 0.0–0.4)
Eos: 2 %
Hematocrit: 43.7 % (ref 37.5–51.0)
Hemoglobin: 15 g/dL (ref 12.6–17.7)
Immature Grans (Abs): 0 10*3/uL (ref 0.0–0.1)
Immature Granulocytes: 0 %
Lymphocytes Absolute: 2 10*3/uL (ref 0.7–3.1)
Lymphs: 40 %
MCH: 32 pg (ref 26.6–33.0)
MCHC: 34.3 g/dL (ref 31.5–35.7)
MCV: 93 fL (ref 79–97)
Monocytes Absolute: 0.4 10*3/uL (ref 0.1–0.9)
Monocytes: 8 %
Neutrophils Absolute: 2.3 10*3/uL (ref 1.4–7.0)
Neutrophils: 49 %
Platelets: 199 10*3/uL (ref 150–379)
RBC: 4.69 x10E6/uL (ref 4.14–5.80)
RDW: 13.5 % (ref 12.3–15.4)
WBC: 4.9 10*3/uL (ref 3.4–10.8)

## 2015-09-05 LAB — COMPREHENSIVE METABOLIC PANEL
ALT: 37 IU/L (ref 0–44)
AST: 36 IU/L (ref 0–40)
Albumin/Globulin Ratio: 1.6 (ref 1.1–2.5)
Albumin: 4.3 g/dL (ref 3.5–4.7)
Alkaline Phosphatase: 43 IU/L (ref 39–117)
BUN/Creatinine Ratio: 18 (ref 10–22)
BUN: 17 mg/dL (ref 8–27)
Bilirubin Total: 0.8 mg/dL (ref 0.0–1.2)
CO2: 25 mmol/L (ref 18–29)
Calcium: 9.6 mg/dL (ref 8.6–10.2)
Chloride: 100 mmol/L (ref 97–108)
Creatinine, Ser: 0.96 mg/dL (ref 0.76–1.27)
GFR calc Af Amer: 85 mL/min/{1.73_m2} (ref >59)
GFR calc non Af Amer: 74 mL/min/{1.73_m2} (ref >59)
Globulin, Total: 2.7 g/dL (ref 1.5–4.5)
Glucose: 81 mg/dL (ref 65–99)
Potassium: 4.7 mmol/L (ref 3.5–5.2)
Sodium: 140 mmol/L (ref 134–144)
Total Protein: 7 g/dL (ref 6.0–8.5)

## 2015-09-05 LAB — HEMOGLOBIN A1C
Est. average glucose Bld gHb Est-mCnc: 117 mg/dL
Hgb A1c MFr Bld: 5.7 % — ABNORMAL HIGH (ref 4.8–5.6)

## 2015-09-05 LAB — PSA: Prostate Specific Ag, Serum: 0.7 ng/mL (ref 0.0–4.0)

## 2015-12-02 ENCOUNTER — Other Ambulatory Visit: Payer: Self-pay | Admitting: *Deleted

## 2015-12-02 MED ORDER — METOPROLOL TARTRATE 100 MG PO TABS: ORAL_TABLET | ORAL | Status: DC

## 2015-12-02 MED ORDER — PRAVASTATIN SODIUM 20 MG PO TABS: ORAL_TABLET | ORAL | Status: DC

## 2015-12-02 NOTE — Telephone Encounter (Signed)
Patient requested  For mail order

## 2016-01-22 ENCOUNTER — Encounter: Payer: Medicare PPO | Admitting: Internal Medicine

## 2016-01-28 ENCOUNTER — Other Ambulatory Visit: Payer: Self-pay | Admitting: *Deleted

## 2016-01-28 MED ORDER — GABAPENTIN 300 MG PO CAPS: 300.0000 mg | ORAL_CAPSULE | Freq: Three times a day (TID) | ORAL | Status: DC

## 2016-01-28 NOTE — Telephone Encounter (Signed)
Patient requested to be faxed to [facility] for refill.

## 2016-03-30 ENCOUNTER — Encounter: Payer: Medicare PPO | Admitting: Internal Medicine

## 2016-04-13 ENCOUNTER — Telehealth: Payer: Self-pay | Admitting: *Deleted

## 2016-04-13 DIAGNOSIS — M48061 Spinal stenosis, lumbar region without neurogenic claudication: Secondary | ICD-10-CM

## 2016-04-13 DIAGNOSIS — M4712 Other spondylosis with myelopathy, cervical region: Secondary | ICD-10-CM

## 2016-04-13 NOTE — Telephone Encounter (Signed)
Patient stated that his appointment with you has been rescheduled 3 different times. Patient's legs are getting worse and falling asleep. Patient called and scheduled an appointment with his Orthopedic Dr. Normajean Glasgow at [facility] [phone] and the appointment is this Friday at 9:00 and he needs a referral due to his insurance. Is this ok with you? Please Advise.

## 2016-04-13 NOTE — Telephone Encounter (Signed)
Order placed and message sent to [facility]. Dr. Gabriel Carina told patient he needed a referral from PCP

## 2016-04-13 NOTE — Telephone Encounter (Signed)
Referral received in workqueue and was faxed to [place] (Dr. Leland Her office)

## 2016-04-13 NOTE — Telephone Encounter (Signed)
It is fine with me

## 2016-04-13 NOTE — Telephone Encounter (Signed)
A referral should not be required due to his insurance because patient is not on the [place]. Unless the doctors office is just requiring a referral for office notes??

## 2016-05-13 ENCOUNTER — Encounter: Payer: Self-pay | Admitting: Cardiovascular Disease

## 2016-05-13 ENCOUNTER — Encounter (INDEPENDENT_AMBULATORY_CARE_PROVIDER_SITE_OTHER): Payer: Self-pay

## 2016-05-13 ENCOUNTER — Ambulatory Visit (INDEPENDENT_AMBULATORY_CARE_PROVIDER_SITE_OTHER): Payer: Medicare PPO | Admitting: Cardiovascular Disease

## 2016-05-13 VITALS — BP 140/80 | HR 70 | Ht 65.0 in | Wt 165.0 lb

## 2016-05-13 DIAGNOSIS — I480 Paroxysmal atrial fibrillation: Secondary | ICD-10-CM

## 2016-05-13 DIAGNOSIS — I5032 Chronic diastolic (congestive) heart failure: Secondary | ICD-10-CM | POA: Diagnosis not present

## 2016-05-13 DIAGNOSIS — I10 Essential (primary) hypertension: Secondary | ICD-10-CM

## 2016-05-13 NOTE — Patient Instructions (Signed)
You are doing well. No medication changes were made.  Please call us if you have new issues that need to be addressed before your next appt.  Your physician wants you to follow-up in: 12 months.  You will receive a reminder letter in the mail two months in advance. If you don't receive a letter, please call our office to schedule the follow-up appointment. 

## 2016-05-13 NOTE — Progress Notes (Signed)
Patient ID: Eric [name], male   DOB: 02-20-1932, 82 y.o.   MRN: [id number] Cardiology Office Note  Date:  05/13/2016   ID:  Eric [name], DOB 07-Jun-1932, MRN [id number]  PCP:  Hollace Kinnier, DO   Chief Complaint  Patient presents with  . other    12 month follow up. Meds reviewed by the patient verbally. Pt. c/o shortness of breath with irreg. heart beats.     HPI:  Eric [name] is a very pleasant 82 year old gentleman with a history of atrial fibrillation, frequent PVCs, who has been maintaining sinus rhythm, with severe neck disease and stenosis who underwent cervical and thoracic laminectomy and fusion with instrumentation at the beginning of March 2012 with postoperative complications including aspiration requiring a feeding tube , atrial fibrillation, who presents for routine followup of his atrial fibrillation. 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  He denies any significant tachycardia or palpitations concerning for atrial fibrillation or arrhythmia. Perhaps has palpitations for several minutes 1 time per week, nothing lasting for a long He has been taking his metoprolol only in the morning Denies any chest pain concerning for angina.  Exercise limited by leg and back pain. Walks with a cane, high fall risk  History ofrestless leg at nighttime. He reports having a limited range of motion of his left shoulder, unable to raise his left arm above his shoulder height. Denies any trauma to the left arm  Previous lab work reviewed with him showing hemoglobin A1c 5.7, total cholesterol 144  EKG on today's visit shows normal sinus rhythm with rate 70 bpm, no significant ST or T-wave changes  Other past medical history Previous visit for cortisone shot for his back was put on hold secondary to ectopy .  In followup in our clinic, 30 day Holter monitor was ordered.This has shown frequent PVCs, occasional couplets, triplets. Very rare  episodes of dizziness.   PMH:   has a past medical history of Hypertension; DVT (deep venous thrombosis) ([place]); PAF (paroxysmal atrial fibrillation) ([place]); Pneumonia (03/06/2003); Arthritis; Anxiety; DDD (degenerative disc disease), cervical; Depressive disorder, not elsewhere classified; Depressive disorder, not elsewhere classified; Thoracic or lumbosacral neuritis or radiculitis, unspecified; Pain in limb; Impacted cerumen; Family history of other cardiovascular diseases(V17.49); Mixed hyperlipidemia; Atrial fibrillation ([place]); Spinal stenosis, unspecified region other than cervical; Edema; Palpitations; Other myelopathy; Other dyspnea and respiratory abnormality; Dysphagia, oral phase; Hypoxemia; Squamous cell carcinoma of skin of trunk, except scrotum; and Chronic diastolic CHF (congestive heart failure) ([place]) (01/19/2015).  PSH:    Past Surgical History  Procedure Laterality Date  . History of abd ultrasound  11/01    fatty liver  . Cervical fusion  02/10/2011    Current Outpatient Prescriptions  Medication Sig Dispense Refill  . aspirin 81 MG tablet Take 1 tablet (81 mg total) by mouth daily. 30 tablet 3  . Cholecalciferol (VITAMIN D) 1000 UNITS capsule Take one tablet once daily 90 capsule 3  . gabapentin (NEURONTIN) 300 MG capsule Take 1 capsule (300 mg total) by mouth 3 (three) times daily. For nerve pain in Back 270 capsule 3  . levocetirizine (XYZAL) 5 MG tablet Take 5 mg by mouth every evening. For Allergies    . metoprolol (LOPRESSOR) 100 MG tablet Take one tablet by mouth twice daily for heart 180 tablet 3  . pravastatin (PRAVACHOL) 20 MG tablet Take one tablet once at bedtime for cholesterol 90 tablet 3  . traMADol (ULTRAM)  50 MG tablet Take 1 tablet (50 mg total) by mouth 2 (two) times daily as needed. For Back Pain 30 tablet 0   No current facility-administered medications for this visit.     Allergies:   Morphine and related; Percocet; and Valium   Social History:  The  patient  reports that he has never smoked. He has never used smokeless tobacco. He reports that he does not drink alcohol or use illicit drugs.   Family History:   family history includes Atrial fibrillation in his brother; Cancer in his other; Heart disease in his brother; Stroke in his father and paternal grandfather.    Review of Systems: Review of Systems  Constitutional: Negative.   Respiratory: Negative.   Cardiovascular: Negative.   Gastrointestinal: Negative.   Musculoskeletal: Negative.        Gait instability, decreased range of motion left arm  Neurological: Negative.   Psychiatric/Behavioral: Negative.   All other systems reviewed and are negative.    PHYSICAL EXAM: VS:  BP 140/80 mmHg  Pulse 70  Ht 5\' 5"  (1.651 m)  Wt 165 lb (74.844 kg)  BMI 27.46 kg/m2 , BMI Body mass index is 27.46 kg/(m^2). GEN: Well nourished, well developed, in no acute distress HEENT: normal Neck: no JVD, carotid bruits, or masses Cardiac: RRR; no murmurs, rubs, or gallops,no edema  Respiratory:  clear to auscultation bilaterally, normal work of breathing GI: soft, nontender, nondistended, + BS MS: no deformity or atrophy Skin: warm and dry, no rash Neuro:  Strength and sensation are intact Psych: euthymic mood, full affect    Recent Labs: 09/04/2015: ALT 37; BUN 17; Creatinine, Ser 0.96; Platelets 199; Potassium 4.7; Sodium 140    Lipid Panel Lab Results  Component Value Date   CHOL 144 09/04/2015   HDL 31* 09/04/2015   LDLCALC 62 09/04/2015   TRIG 255* 09/04/2015      Wt Readings from Last 3 Encounters:  05/13/16 165 lb (74.844 kg)  09/04/15 169 lb (76.658 kg)  06/01/15 168 lb 9.6 oz (76.476 kg)       ASSESSMENT AND PLAN:  PAF (paroxysmal atrial fibrillation) (HCC) - Plan: EKG 12-Lead Rare episodes lasting several minutes at a per the patient No changes to his medications  Chronic diastolic CHF (congestive heart failure) (HCC) - Plan: EKG 12-Lead Appears euvolemic  on today's visit Blood pressure stable  Essential hypertension - Plan: EKG 12-Lead Blood pressure is well controlled on today's visit. No changes made to the medications.   Disposition:   F/U  12 months   Total encounter time more than 15 minutes  Greater than 50% was spent in counseling and coordination of care with the patient    Orders Placed This Encounter  Procedures  . EKG 12-Lead     Signed, Esmond Plants, M.D., Ph.D. 05/13/2016  [facility], [place]

## 2016-05-16 ENCOUNTER — Encounter: Payer: Self-pay | Admitting: Internal Medicine

## 2016-05-16 ENCOUNTER — Ambulatory Visit (INDEPENDENT_AMBULATORY_CARE_PROVIDER_SITE_OTHER): Payer: Medicare PPO | Admitting: Internal Medicine

## 2016-05-16 VITALS — BP 132/80 | HR 75 | Temp 98.1°F | Ht 66.0 in | Wt 164.0 lb

## 2016-05-16 DIAGNOSIS — I5032 Chronic diastolic (congestive) heart failure: Secondary | ICD-10-CM

## 2016-05-16 DIAGNOSIS — Z0000 Encounter for general adult medical examination without abnormal findings: Secondary | ICD-10-CM

## 2016-05-16 DIAGNOSIS — D638 Anemia in other chronic diseases classified elsewhere: Secondary | ICD-10-CM | POA: Diagnosis not present

## 2016-05-16 DIAGNOSIS — R739 Hyperglycemia, unspecified: Secondary | ICD-10-CM

## 2016-05-16 DIAGNOSIS — E782 Mixed hyperlipidemia: Secondary | ICD-10-CM | POA: Diagnosis not present

## 2016-05-16 DIAGNOSIS — M4806 Spinal stenosis, lumbar region: Secondary | ICD-10-CM | POA: Diagnosis not present

## 2016-05-16 DIAGNOSIS — M4712 Other spondylosis with myelopathy, cervical region: Secondary | ICD-10-CM | POA: Diagnosis not present

## 2016-05-16 DIAGNOSIS — I480 Paroxysmal atrial fibrillation: Secondary | ICD-10-CM

## 2016-05-16 DIAGNOSIS — M48062 Spinal stenosis, lumbar region with neurogenic claudication: Secondary | ICD-10-CM

## 2016-05-16 NOTE — Progress Notes (Signed)
Patient ID: Eric [name], male   DOB: 12/14/1931, 82 y.o.   MRN: [id number] MMSE 30/30 passed clock drawing    Location:  [facility] clinic Provider: Coree Brame L. Mariea Clonts, D.O., C.M.D.  Patient Care Team: [name], DO as PCP - General (Geriatric Medicine) [name], MD as Consulting Physician (Cardiology) Normajean Glasgow, MD as Attending Physician (Physical Medicine and Rehabilitation) Phylliss Bob, MD as Consulting Physician (Orthopedic Surgery)  Extended Emergency Contact Information Primary Emergency Contact: Rosten,Margie Address: [address]          [place], [place] [address] Johnnette Litter of [place] Phone: [phone] Relation: Spouse  Code Status: DNR Goals of Care: Advanced Directive information Advanced Directives 05/16/2016  Does patient have an advance directive? No  Type of Advance Directive -  Does patient want to make changes to advanced directive? -  Copy of advanced directive(s) in chart? -     Chief Complaint  Patient presents with  . Annual Exam    Wellness exam  . MMSE    30/30 passed clock    HPI: Patient is a 82 y.o. male seen in today for an annual wellness exam.    It's his bday today.  Feet and legs go to sleep.  Drove up here this am, when he got inside to give his name to check in, they were going to sleep and then he almost fell in the hall.  His wife has had breast surgery and cannot do much to help him.  The numbness goes up above his waist sometimes and he also has the numbness and weakness of his hands with known cervical OA s/p surgery.  He has no incontinence.    Went to heart doctor Friday.  He had good pulses in his feet/legs.    Throat is squeaky when he tries to talk.  Is worse if he has not spoken for a while.  No increase in mucus.  No indigestion.  Sometimes with trouble swallowing.  No choking spells.  No sore throat.  No shortness of breath or wheezing.    Says he needs his ears washed out.  Went out to eat with his son and he couldn't  hear when they were eating dinner at [employer].    Had shot in his back 5/30 with Dr. Jacelyn Grip.  Saw Dr. Lynann Bologna who operated on his neck.  Hands remain numb.  Sometimes can pick things up, other times, cannot.  If he has a cup of coffee, he holds it with both hands.    Depression screen [facility] 2/9 05/16/2016 01/19/2015 06/12/2014 03/22/2013  Decreased Interest 0 0 0 0  Down, Depressed, Hopeless 0 0 0 0  PHQ - 2 Score 0 0 0 0   Fall Risk  05/16/2016 09/04/2015 06/01/2015 01/19/2015 09/15/2014  Falls in the past year? Yes Yes No No No  Number falls in past yr: 1 1 - - -  Injury with Fall? No No - - -  Fell two weeks ago while mowing after all the rain-slipped.  Had two dirty knees on his britches, but no injury.    MMSE - Mini Mental State Exam 05/16/2016  Orientation to time 5  Orientation to Place 5  Registration 3  Attention/ Calculation 5  Recall 3  Language- name 2 objects 2  Language- repeat 1  Language- follow 3 step command 3  Language- read & follow direction 1  Write a sentence 1  Copy design 1  Total score 30  passed  clock  Health Maintenance  Topic Date Due  . COLONOSCOPY  08/29/2014  . INFLUENZA VACCINE  07/12/2016  . TETANUS/TDAP  10/15/2023  . ZOSTAVAX  Completed  . PNA vac Low Risk Adult  Completed    Urinary incontinence?  No difficulty Functional Status Survey: Is the patient deaf or have difficulty hearing?: Yes Does the patient have difficulty seeing, even when wearing glasses/contacts?: No Does the patient have difficulty concentrating, remembering, or making decisions?: No Does the patient have difficulty walking or climbing stairs?: Yes Does the patient have difficulty dressing or bathing?: No Does the patient have difficulty doing errands alone such as visiting a doctor's office or shopping?: Yes Exercise?  Stays active working outside.   Diet?  Working on eating healthy (outside of the trip to [place]) Vision Screening Comments: Dr.Phillip Gloriann Loan  About a year ago,  so due to go back.   Hearing:  Worse lately, needs ears checked, maybe flushed.  Dentition:  No difficulty lately. Pain:  Has had shot in his lower back recently by Dr. Jacelyn Grip which helped considerably. Uses tramadol twice a day, not three times a day.  Does not really get pain in between unless he's been overdoing it.      Past Medical History  Diagnosis Date  . Hypertension   . DVT (deep venous thrombosis) ([place])   . PAF (paroxysmal atrial fibrillation) ([place])     a. 01/2011 Echo: EF 50-55%, Gr 1 DD, Mild AI/MR/TR.  Marland Kitchen Pneumonia 03/06/2003  . Arthritis   . Anxiety   . DDD (degenerative disc disease), cervical   . Depressive disorder, not elsewhere classified   . Depressive disorder, not elsewhere classified   . Thoracic or lumbosacral neuritis or radiculitis, unspecified   . Pain in limb   . Impacted cerumen   . Family history of other cardiovascular diseases(V17.49)   . Mixed hyperlipidemia   . Atrial fibrillation ([place])   . Spinal stenosis, unspecified region other than cervical   . Edema   . Palpitations   . Other myelopathy   . Other dyspnea and respiratory abnormality   . Dysphagia, oral phase   . Hypoxemia   . Squamous cell carcinoma of skin of trunk, except scrotum   . Chronic diastolic CHF (congestive heart failure) ([place]) 01/19/2015    Past Surgical History  Procedure Laterality Date  . History of abd ultrasound  11/01    fatty liver  . Cervical fusion  02/10/2011    Social History   Social History  . Marital Status: Married    Spouse Name: N/A  . Number of Children: 2  . Years of Education: N/A   Occupational History  . 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 Smoking status: Never Smoker   . Smokeless tobacco: Never Used  . Alcohol Use: No  . Drug Use: No  . Sexual Activity: Not on file   Other Topics Concern  . Not on file   Social History Narrative   Regular exercise- yes- therapy    Allergies   Allergen Reactions  . Morphine And Related Shortness Of Breath  . Percocet [Oxycodone-Acetaminophen] Shortness Of Breath  . Valium Shortness Of Breath      Medication List       This list is accurate as of: 05/16/16 11:59 PM.  Always use your most recent med list.  aspirin 81 MG tablet  Take 1 tablet (81 mg total) by mouth daily.     gabapentin 300 MG capsule  Commonly known as:  NEURONTIN  Take 1 capsule (300 mg total) by mouth 3 (three) times daily. For nerve pain in Back     levocetirizine 5 MG tablet  Commonly known as:  XYZAL  Take 5 mg by mouth every evening. For Allergies     metoprolol 100 MG tablet  Commonly known as:  LOPRESSOR  Take one tablet by mouth twice daily for heart     pravastatin 20 MG tablet  Commonly known as:  PRAVACHOL  Take one tablet once at bedtime for cholesterol     traMADol 50 MG tablet  Commonly known as:  ULTRAM  Take 1 tablet (50 mg total) by mouth 2 (two) times daily as needed. For Back Pain     Vitamin D 1000 units capsule  Take one tablet once daily         Review of Systems:  Review of Systems  Constitutional: Negative for fever and chills.  HENT: Negative for sore throat.        Squeaky voice  Respiratory: Negative for shortness of breath.   Cardiovascular: Negative for chest pain.  Gastrointestinal: Negative for abdominal pain.  Genitourinary: Negative for dysuria, urgency and frequency.  Musculoskeletal: Positive for myalgias, back pain, falls and neck pain.  Skin: Negative for rash.  Neurological: Positive for tingling, sensory change, focal weakness and weakness. Negative for tremors, speech change, loss of consciousness and headaches.       Right leg  Psychiatric/Behavioral: Negative for depression and memory loss.    Physical Exam: Filed Vitals:   05/16/16 0903  BP: 132/80  Pulse: 75  Temp: 98.1 F (36.7 C)  TempSrc: Oral  Height: 5\' 6"  (1.676 m)  Weight: 164 lb (74.39 kg)  SpO2: 98%    Body mass index is 26.48 kg/(m^2). Physical Exam  Constitutional: He is oriented to person, place, and time. He appears well-developed and well-nourished.  Neck: Neck supple. No JVD present.  Cardiovascular:  irreg irreg  Pulmonary/Chest: Effort normal and breath sounds normal.  Musculoskeletal: Normal range of motion. He exhibits tenderness.  Uses cane; decreased handgrip also vs. Baseline bilaterally  Lymphadenopathy:    He has no cervical adenopathy.  Neurological: He is alert and oriented to person, place, and time.  Left DTR diminished, walks with limp, diminished sensation of lower extremities up into thoracic region at times  Skin: Skin is warm and dry.  Psychiatric: He has a normal mood and affect.    Labs reviewed: Basic Metabolic Panel:  Recent Labs  06/01/15 1140 09/04/15 1049 05/16/16 1035  NA 138 140 140  K 4.3 4.7 4.5  CL 100 100 100  CO2 23 25 23   GLUCOSE 90 81 83  BUN 17 17 22   CREATININE 0.78 0.96 0.97  CALCIUM 9.0 9.6 9.1   Liver Function Tests:  Recent Labs  09/04/15 1049 05/16/16 1035  AST 36 26  ALT 37 36  ALKPHOS 43 45  BILITOT 0.8 0.8  PROT 7.0 6.5  ALBUMIN 4.3 4.0   No results for input(s): LIPASE, AMYLASE in the last 8760 hours. No results for input(s): AMMONIA in the last 8760 hours. CBC:  Recent Labs  09/04/15 1049 05/16/16 1035  WBC 4.9 6.4  NEUTROABS 2.3 3.5  HCT 43.7 43.2  MCV 93 95  PLT 199 208   Lipid Panel:  Recent Labs  09/04/15 1049  05/16/16 1035  CHOL 144 169  HDL 31* 37*  LDLCALC 62 90  TRIG 255* 210*  CHOLHDL 4.6 4.6   Lab Results  Component Value Date   HGBA1C 5.7* 05/16/2016    Assessment/Plan 1. Medicare annual wellness visit, subsequent -see hpi -vaccines updated, HEDIS measures addressed above -BMI indicates obesity but he's 84 and needs the cushioning with all of the falling he's doing from his spinal stenosis, radiculopathy, myelopathy - CBC with Differential/Platelet - Comprehensive  metabolic panel - Hemoglobin A1c - Lipid panel  2. Spinal stenosis, lumbar region, with neurogenic claudication -specialists have not ordered any f/u imaging or imaging of his thoracic spine when his legs are falling asleep and giving out on him resulting in falls--this is new and worrisome to me that either he has a lesion in his thoracic spine (not his chest at all, but his thoracic spine) or some stenosis/compression fracture/herniation in that region or his lumbar -he does not have any red flags  - CT Thoracic Spine Wo Contrast; Future - CT Lumbar Spine Wo Contrast; Future  3. Cervical spondylosis with myelopathy - s/p surgery but still with loss of strength at times resulting in dropping items at times and now with weakness of the legs and numbness with falls  -has seen his neurosurgeon and PMR physician and no one has addressed his new concerns so tests ordered: - CT Thoracic Spine Wo Contrast; Future - CT Lumbar Spine Wo Contrast; Future -suspect advancing neck oa [place] be contributing to his "squeaky voice" and some swallowing difficulty based on his prior neck imaging and if this worsens, will been MBS   4. Hyperlipidemia, mixed - a little better than previous, cont to work on decreased starchy foods and fried foods - Comprehensive metabolic panel - Lipid panel  5. Anemia of chronic disease -f/u cbc - CBC with Differential/Platelet  6. Chronic diastolic CHF (congestive heart failure) ([facility]) -no new signs or symptoms to suggest exacerbation, cont current meds and monitor  7. PAF (paroxysmal atrial fibrillation) ([place]) -continues on baby asa only due to many many falls and bruises, bleeding, rate controlled with metoprolol (lopressor)  8. Hyperglycemia -sugar average stable, cont to work on diet, cont active lifestyle as tolerable with neuro and orthopedics issues - Hemoglobin A1c   Labs/tests ordered:  Orders Placed This Encounter  Procedures  . CT Thoracic Spine Wo  Contrast    WT-165/PT USES [facility],PT WITH EPIC ORDER    Standing Status: Future     Number of Occurrences:      Standing Expiration Date: 08/16/2017    Order Specific Question:  Reason for Exam (SYMPTOM  OR DIAGNOSIS REQUIRED)    Answer:  legs falling asleep    Order Specific Question:  Preferred imaging location?    Answer:  [facility]  . CT Lumbar Spine Wo Contrast    Standing Status: Future     Number of Occurrences:      Standing Expiration Date: 08/16/2017    Order Specific Question:  Reason for Exam (SYMPTOM  OR DIAGNOSIS REQUIRED)    Answer:  legs falling asleep; h/o lumbar spinal stenosis, gets epidural injections    Order Specific Question:  Preferred imaging location?    Answer:  [facility]  . CBC with Differential/Platelet  . Comprehensive metabolic panel    Order Specific Question:  Has the patient fasted?    Answer:  Yes  . Hemoglobin A1c  . Lipid panel    Order  Specific Question:  Has the patient fasted?    Answer:  Yes    Next appt:  3 mos med mgt  Jahziel Sinn L. Debborah Alonge, D.O. [facility] [address], [place] [address] Cell Phone (Mon-Fri 8am-5pm):  [phone] On Call:  [phone] & follow prompts after 5pm & weekends Office Phone:  [phone] Office Fax:  [phone]

## 2016-05-17 LAB — CBC WITH DIFFERENTIAL/PLATELET
Basophils Absolute: 0 10*3/uL (ref 0.0–0.2)
Basos: 0 %
EOS (ABSOLUTE): 0.1 10*3/uL (ref 0.0–0.4)
Eos: 1 %
Hematocrit: 43.2 % (ref 37.5–51.0)
Hemoglobin: 14.7 g/dL (ref 12.6–17.7)
Immature Grans (Abs): 0 10*3/uL (ref 0.0–0.1)
Immature Granulocytes: 1 %
Lymphocytes Absolute: 2.4 10*3/uL (ref 0.7–3.1)
Lymphs: 38 %
MCH: 32.2 pg (ref 26.6–33.0)
MCHC: 34 g/dL (ref 31.5–35.7)
MCV: 95 fL (ref 79–97)
Monocytes Absolute: 0.4 10*3/uL (ref 0.1–0.9)
Monocytes: 6 %
Neutrophils Absolute: 3.5 10*3/uL (ref 1.4–7.0)
Neutrophils: 54 %
Platelets: 208 10*3/uL (ref 150–379)
RBC: 4.56 x10E6/uL (ref 4.14–5.80)
RDW: 13.4 % (ref 12.3–15.4)
WBC: 6.4 10*3/uL (ref 3.4–10.8)

## 2016-05-17 LAB — COMPREHENSIVE METABOLIC PANEL
ALT: 36 IU/L (ref 0–44)
AST: 26 IU/L (ref 0–40)
Albumin/Globulin Ratio: 1.6 (ref 1.2–2.2)
Albumin: 4 g/dL (ref 3.5–4.7)
Alkaline Phosphatase: 45 IU/L (ref 39–117)
BUN/Creatinine Ratio: 23 (ref 10–24)
BUN: 22 mg/dL (ref 8–27)
Bilirubin Total: 0.8 mg/dL (ref 0.0–1.2)
CO2: 23 mmol/L (ref 18–29)
Calcium: 9.1 mg/dL (ref 8.6–10.2)
Chloride: 100 mmol/L (ref 96–106)
Creatinine, Ser: 0.97 mg/dL (ref 0.76–1.27)
GFR calc Af Amer: 84 mL/min/{1.73_m2} (ref >59)
GFR calc non Af Amer: 72 mL/min/{1.73_m2} (ref >59)
Globulin, Total: 2.5 g/dL (ref 1.5–4.5)
Glucose: 83 mg/dL (ref 65–99)
Potassium: 4.5 mmol/L (ref 3.5–5.2)
Sodium: 140 mmol/L (ref 134–144)
Total Protein: 6.5 g/dL (ref 6.0–8.5)

## 2016-05-17 LAB — LIPID PANEL
Chol/HDL Ratio: 4.6 ratio units (ref 0.0–5.0)
Cholesterol, Total: 169 mg/dL (ref 100–199)
HDL: 37 mg/dL — ABNORMAL LOW (ref >39)
LDL Calculated: 90 mg/dL (ref 0–99)
Triglycerides: 210 mg/dL — ABNORMAL HIGH (ref 0–149)
VLDL Cholesterol Cal: 42 mg/dL — ABNORMAL HIGH (ref 5–40)

## 2016-05-17 LAB — HEMOGLOBIN A1C
Est. average glucose Bld gHb Est-mCnc: 117 mg/dL
Hgb A1c MFr Bld: 5.7 % — ABNORMAL HIGH (ref 4.8–5.6)

## 2016-05-25 ENCOUNTER — Other Ambulatory Visit: Payer: Self-pay | Admitting: *Deleted

## 2016-05-25 MED ORDER — PRAVASTATIN SODIUM 40 MG PO TABS: 40.0000 mg | ORAL_TABLET | Freq: Every day | ORAL | Status: DC

## 2016-05-25 NOTE — Telephone Encounter (Signed)
Increase Pravastatin due to Cholesterol Blood Work per Dr. Mariea Clonts. Patient will take 2 of the 20mg  first to use them up and to make sure he can tolerate.

## 2016-05-26 ENCOUNTER — Ambulatory Visit
Admission: RE | Admit: 2016-05-26 | Discharge: 2016-05-26 | Disposition: A | Discharge status: Home / Self Care | Payer: Medicare PPO | Source: Ambulatory Visit | Attending: Internal Medicine | Admitting: Internal Medicine

## 2016-05-26 DIAGNOSIS — M48062 Spinal stenosis, lumbar region with neurogenic claudication: Secondary | ICD-10-CM

## 2016-05-26 DIAGNOSIS — M4712 Other spondylosis with myelopathy, cervical region: Secondary | ICD-10-CM

## 2016-05-30 ENCOUNTER — Other Ambulatory Visit: Payer: Self-pay | Admitting: *Deleted

## 2016-05-30 MED ORDER — METOPROLOL TARTRATE 100 MG PO TABS: ORAL_TABLET | ORAL | Status: DC

## 2016-05-30 MED ORDER — PRAVASTATIN SODIUM 40 MG PO TABS: 40.0000 mg | ORAL_TABLET | Freq: Every day | ORAL | Status: DC

## 2016-05-30 NOTE — Telephone Encounter (Signed)
Patient requested and Pravastatin dosage changed.

## 2016-06-07 ENCOUNTER — Telehealth: Payer: Self-pay | Admitting: *Deleted

## 2016-06-07 DIAGNOSIS — M4712 Other spondylosis with myelopathy, cervical region: Secondary | ICD-10-CM

## 2016-06-07 DIAGNOSIS — M48061 Spinal stenosis, lumbar region without neurogenic claudication: Secondary | ICD-10-CM

## 2016-06-07 NOTE — Telephone Encounter (Signed)
Patient would like a referral to Dr. Lynann Bologna to discuss his CT Scan Results and options. Referral placed.

## 2016-06-07 NOTE — Telephone Encounter (Signed)
Agree  Thank you

## 2016-06-23 ENCOUNTER — Other Ambulatory Visit: Payer: Self-pay | Admitting: *Deleted

## 2016-06-23 MED ORDER — GABAPENTIN 300 MG PO CAPS: 300.0000 mg | ORAL_CAPSULE | Freq: Three times a day (TID) | ORAL | Status: DC

## 2016-06-23 NOTE — Telephone Encounter (Signed)
Patient requested refill to Mail order

## 2016-07-05 ENCOUNTER — Telehealth: Payer: Self-pay | Admitting: *Deleted

## 2016-07-05 NOTE — Telephone Encounter (Signed)
Thanks, Rodena Piety.  I agree in holding off if he can stand it also due to his increasingly frail state.  We will have to monitor his balance and his kidneys (BMP) as he increases his gabapentin.

## 2016-07-05 NOTE — Telephone Encounter (Signed)
Patient saw Dr. Lynann Bologna today and he advised patient to try the back injections with Dr. Mina Marble (Racette increase the medication in the injection and can get it up to 3-4 times a year), Physical Therapy and to Increase his Gabapentin to 600mg  three times daily and [place] increase if needed. Dr. Lynann Bologna stated that he would try this for a couple of months and if no improvement he would consider surgery, but it would be a day worth of surgery and the recovery time would be at least 6 months. Instructed patient to use his rolling walker while out and about.   Medication updated in patient's chart.

## 2016-07-17 ENCOUNTER — Other Ambulatory Visit: Payer: Self-pay | Admitting: Internal Medicine

## 2016-07-17 DIAGNOSIS — M79672 Pain in left foot: Secondary | ICD-10-CM

## 2016-07-17 DIAGNOSIS — W19XXXA Unspecified fall, initial encounter: Secondary | ICD-10-CM

## 2016-07-17 DIAGNOSIS — M25475 Effusion, left foot: Secondary | ICD-10-CM

## 2016-07-17 NOTE — Progress Notes (Signed)
Pt's daughter in law notified me that he had a fall and his left foot was swollen and painful.  He needs xrays.

## 2016-07-18 ENCOUNTER — Ambulatory Visit
Admission: RE | Admit: 2016-07-18 | Discharge: 2016-07-18 | Disposition: A | Discharge status: Home / Self Care | Payer: Medicare PPO | Source: Ambulatory Visit | Attending: Internal Medicine | Admitting: Internal Medicine

## 2016-07-18 DIAGNOSIS — M79672 Pain in left foot: Secondary | ICD-10-CM

## 2016-07-18 DIAGNOSIS — W19XXXA Unspecified fall, initial encounter: Secondary | ICD-10-CM

## 2016-07-18 DIAGNOSIS — M25475 Effusion, left foot: Secondary | ICD-10-CM

## 2016-07-22 ENCOUNTER — Encounter: Payer: Self-pay | Admitting: Internal Medicine

## 2016-08-16 ENCOUNTER — Other Ambulatory Visit: Payer: Self-pay | Admitting: *Deleted

## 2016-08-16 MED ORDER — GABAPENTIN 600 MG PO TABS: 600.0000 mg | ORAL_TABLET | Freq: Three times a day (TID) | ORAL | 3 refills | Status: DC

## 2016-08-16 NOTE — Telephone Encounter (Signed)
Patient requested refill due to running low due to increase

## 2016-08-22 ENCOUNTER — Ambulatory Visit: Payer: Self-pay | Admitting: Internal Medicine

## 2016-11-14 ENCOUNTER — Other Ambulatory Visit: Payer: Self-pay | Admitting: *Deleted

## 2016-11-14 MED ORDER — METOPROLOL TARTRATE 100 MG PO TABS
ORAL_TABLET | ORAL | 3 refills | Status: DC
Start: 2016-11-14 — End: 2017-05-25

## 2016-11-14 MED ORDER — PRAVASTATIN SODIUM 40 MG PO TABS
40.0000 mg | ORAL_TABLET | Freq: Every day | ORAL | 3 refills | Status: DC
Start: 2016-11-14 — End: 2017-09-01

## 2016-11-14 NOTE — Telephone Encounter (Signed)
Patient requested refill

## 2016-12-08 ENCOUNTER — Other Ambulatory Visit: Payer: Self-pay | Admitting: Orthopaedic Surgery

## 2016-12-08 DIAGNOSIS — M25572 Pain in left ankle and joints of left foot: Secondary | ICD-10-CM

## 2016-12-09 ENCOUNTER — Other Ambulatory Visit: Payer: Medicare PPO

## 2016-12-09 ENCOUNTER — Ambulatory Visit
Admission: RE | Admit: 2016-12-09 | Discharge: 2016-12-09 | Disposition: A | Discharge status: Home / Self Care | Payer: Medicare PPO | Source: Ambulatory Visit | Attending: Orthopaedic Surgery | Admitting: Orthopaedic Surgery

## 2016-12-09 DIAGNOSIS — M25572 Pain in left ankle and joints of left foot: Secondary | ICD-10-CM

## 2016-12-20 DIAGNOSIS — Z961 Presence of intraocular lens: Secondary | ICD-10-CM | POA: Diagnosis not present

## 2016-12-21 ENCOUNTER — Other Ambulatory Visit: Payer: Self-pay | Admitting: Orthopaedic Surgery

## 2016-12-21 DIAGNOSIS — S8263XD Displaced fracture of lateral malleolus of unspecified fibula, subsequent encounter for closed fracture with routine healing: Secondary | ICD-10-CM | POA: Diagnosis not present

## 2016-12-27 NOTE — Progress Notes (Signed)
Chart reviewed by Dr Smith Robert. Patient reports that he cannot "be tubed" for surgery. Dr Smith Robert states patient can have regional anesthesia only but Dr Rhona Raider needs to be aware and agree. Horatio Pel at office made aware and she states she will leave a note for Dr Rhona Raider to call me back.

## 2016-12-29 ENCOUNTER — Encounter (HOSPITAL_BASED_OUTPATIENT_CLINIC_OR_DEPARTMENT_OTHER): Payer: Self-pay | Admitting: *Deleted

## 2016-12-29 NOTE — Pre-Procedure Instructions (Signed)
Unable to reach patient to complete pre-op phone interview. Multiple messages left. Dr Jerald Kief office closed at present due to weather. Unable to verify phone # or inform office of UTR.

## 2016-12-29 NOTE — Pre-Procedure Instructions (Signed)
Spoke with patient. Discussed patient's history with Dr. Deatra Canter. OK to come for surgery tomorrow. No labs needed. EKG on chart from 05/2016. Will attempt to do surgery with regional block, LMA placed only if required.

## 2016-12-30 NOTE — H&P (Signed)
Eric [name] is an 83 y.o. male.   Chief Complaint: left ankle pain HPI: Eric [name] is 5 months from his left ankle fracture.  He persists with some significant pain.  He is ambulatory in a brace with a walker.    CT: I reviewed a study done at [facility] on 12/09/16.  This shows a nonunion with an oblique fracture pattern of the distal fibula at the metaphysis.  Past Medical History:  Diagnosis Date  . Anxiety   . Arthritis   . Atrial fibrillation ([place])   . Chronic diastolic CHF (congestive heart failure) ([place]) 01/19/2015  . DDD (degenerative disc disease), cervical   . Depressive disorder, not elsewhere classified   . Depressive disorder, not elsewhere classified   . DVT (deep venous thrombosis) ([place])   . Dysphagia, oral phase   . Dyspnea   . Edema   . Family history of other cardiovascular diseases(V17.49)   . Hypertension   . Hypoxemia   . Impacted cerumen   . Mixed hyperlipidemia   . Other dyspnea and respiratory abnormality   . Other myelopathy   . PAF (paroxysmal atrial fibrillation) ([place])    a. 01/2011 Echo: EF 50-55%, Gr 1 DD, Mild AI/MR/TR.  Marland Kitchen Pain in limb   . Palpitations   . Pneumonia 03/06/2003  . Spinal stenosis, unspecified region other than cervical   . 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 Thoracic or lumbosacral neuritis or radiculitis, unspecified     Past Surgical History:  Procedure Laterality Date  . CERVICAL FUSION  02/10/2011  . history of abd ultrasound  11/01   fatty liver    Family History  Problem Relation Age of Onset  . Stroke Father   . Atrial fibrillation Brother     on coumadin  . Heart disease Brother     AFib- coumadin  . Stroke Paternal Grandfather   . Cancer Other     colon cancer at early age   Social History:  reports that he has never smoked. He has never used smokeless tobacco. He reports that he does not drink alcohol or use drugs.  Allergies:  Allergies  Allergen Reactions   . Morphine And Related Shortness Of Breath  . Percocet [Oxycodone-Acetaminophen] Shortness Of Breath  . Valium Shortness Of Breath    No prescriptions prior to admission.    No results found for this or any previous visit (from the past 48 hour(s)). No results found.  Review of Systems  Musculoskeletal: Positive for joint pain.       Left ankle  All other systems reviewed and are negative.   Height 5\' 6"  (1.676 m), weight 74.8 kg (165 lb). Physical Exam  Constitutional: He is oriented to person, place, and time. He appears well-developed and well-nourished.  HENT:  Head: Normocephalic and atraumatic.  Eyes: Pupils are equal, round, and reactive to light.  Neck: Normal range of motion.  Cardiovascular: Normal rate.   Respiratory: Effort normal.  GI: Soft.  Musculoskeletal:  Eric [name] continues to walk in his brace with a walker.  He has pain mostly lateral but also some medial aspect pain.  His skin is intact.  His motion is a bit limited.  He has intact sensation and a palpable pulse.  Calf is soft and nontender.    Neurological: He is alert and oriented to person, place, and time.  Skin: Skin is warm and dry.  Psychiatric: He has a  normal mood and affect. His behavior is normal. Judgment and thought content normal.     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  He persists with pain and by CT has a nonunion.  I think he should consider takedown of the nonunion and ORIF.  I reviewed risk of anesthesia, infection, and continued failure of this area to heal.  We can do this on an outpatient basis.  I think we will employ the Biomet plate for fixation.  We should be able to fit an interfragmentary screw or two across his fracture.  Tanga Gloor, Larwance Sachs, PA-C 12/30/2016, 8:15 AM

## 2017-01-05 ENCOUNTER — Encounter (HOSPITAL_BASED_OUTPATIENT_CLINIC_OR_DEPARTMENT_OTHER): Payer: Self-pay | Admitting: *Deleted

## 2017-01-05 NOTE — Progress Notes (Signed)
Pt denies SOB and chest pain but is under the care of Dr. Rockey Situ, Cardiology. Pt denies having a stress test and cardiac cath. Pt denies having a chest x ray within the last year. Pt made aware to stop taking Vitamins, fish oil  and herbal medications. Do not take any NSAIDs ie: Ibuprofen, Advil, Naproxen, BC and Goody Powder. Pt verbalized understanding of all pre-op instructions. Spoke with daughter Jenny Reichmann, per pt request, to make aware of new arrival time of 10:00A.M. tomorrow here at [facility] verbalized understanding of instructions. Dr. Conrad Kersey, Anesthesia, made aware of pt cardiac history and problems being intubated. Dr. Conrad Creighton, Anesthesia, reviewed Dr. Donivan Scull note and EKG dated 05/13/16. MD advised that pt is "okay." No new orders given.

## 2017-01-06 ENCOUNTER — Encounter (HOSPITAL_COMMUNITY): Payer: Self-pay | Admitting: *Deleted

## 2017-01-06 ENCOUNTER — Ambulatory Visit (HOSPITAL_BASED_OUTPATIENT_CLINIC_OR_DEPARTMENT_OTHER)
Admission: RE | Admit: 2017-01-06 | Discharge: 2017-01-06 | Disposition: A | Discharge status: Home / Self Care | Payer: PPO | Source: Ambulatory Visit | Attending: Orthopaedic Surgery | Admitting: Orthopaedic Surgery

## 2017-01-06 ENCOUNTER — Ambulatory Visit (HOSPITAL_COMMUNITY): Payer: PPO | Admitting: Certified Registered Nurse Anesthetist

## 2017-01-06 ENCOUNTER — Encounter (HOSPITAL_COMMUNITY)
Admission: RE | Disposition: A | Discharge status: Home / Self Care | Payer: Self-pay | Source: Ambulatory Visit | Attending: Orthopaedic Surgery

## 2017-01-06 DIAGNOSIS — I5032 Chronic diastolic (congestive) heart failure: Secondary | ICD-10-CM | POA: Insufficient documentation

## 2017-01-06 DIAGNOSIS — X58XXXD Exposure to other specified factors, subsequent encounter: Secondary | ICD-10-CM | POA: Diagnosis not present

## 2017-01-06 DIAGNOSIS — E782 Mixed hyperlipidemia: Secondary | ICD-10-CM | POA: Diagnosis not present

## 2017-01-06 DIAGNOSIS — E785 Hyperlipidemia, unspecified: Secondary | ICD-10-CM | POA: Diagnosis not present

## 2017-01-06 DIAGNOSIS — I4891 Unspecified atrial fibrillation: Secondary | ICD-10-CM | POA: Diagnosis not present

## 2017-01-06 DIAGNOSIS — G8918 Other acute postprocedural pain: Secondary | ICD-10-CM | POA: Diagnosis not present

## 2017-01-06 DIAGNOSIS — S8262XK Displaced fracture of lateral malleolus of left fibula, subsequent encounter for closed fracture with nonunion: Secondary | ICD-10-CM | POA: Diagnosis not present

## 2017-01-06 DIAGNOSIS — Z7982 Long term (current) use of aspirin: Secondary | ICD-10-CM | POA: Diagnosis not present

## 2017-01-06 DIAGNOSIS — I110 Hypertensive heart disease with heart failure: Secondary | ICD-10-CM | POA: Insufficient documentation

## 2017-01-06 DIAGNOSIS — Z79899 Other long term (current) drug therapy: Secondary | ICD-10-CM | POA: Insufficient documentation

## 2017-01-06 DIAGNOSIS — S82832K Other fracture of upper and lower end of left fibula, subsequent encounter for closed fracture with nonunion: Secondary | ICD-10-CM | POA: Insufficient documentation

## 2017-01-06 DIAGNOSIS — I480 Paroxysmal atrial fibrillation: Secondary | ICD-10-CM | POA: Insufficient documentation

## 2017-01-06 HISTORY — PX: ORIF FIBULA FRACTURE: SHX5114

## 2017-01-06 HISTORY — DX: Failed or difficult intubation, initial encounter: T88.4XXA

## 2017-01-06 HISTORY — DX: Dyspnea, unspecified: R06.00

## 2017-01-06 HISTORY — DX: Unspecified fracture of unspecified foot, subsequent encounter for fracture with nonunion: S92.909K

## 2017-01-06 HISTORY — DX: Personal history of urinary calculi: Z87.442

## 2017-01-06 LAB — BASIC METABOLIC PANEL
ANION GAP: 9 (ref 5–15)
BUN: 15 mg/dL (ref 6–20)
CHLORIDE: 105 mmol/L (ref 101–111)
CO2: 26 mmol/L (ref 22–32)
Calcium: 9.3 mg/dL (ref 8.9–10.3)
Creatinine, Ser: 0.99 mg/dL (ref 0.61–1.24)
GFR calc non Af Amer: >60 mL/min (ref >60)
GLUCOSE: 86 mg/dL (ref 65–99)
POTASSIUM: 4.5 mmol/L (ref 3.5–5.1)
Sodium: 140 mmol/L (ref 135–145)

## 2017-01-06 LAB — CBC
HEMATOCRIT: 45.2 % (ref 39.0–52.0)
HEMOGLOBIN: 15.2 g/dL (ref 13.0–17.0)
MCH: 31.9 pg (ref 26.0–34.0)
MCHC: 33.6 g/dL (ref 30.0–36.0)
MCV: 94.8 fL (ref 78.0–100.0)
Platelets: 182 10*3/uL (ref 150–400)
RBC: 4.77 MIL/uL (ref 4.22–5.81)
RDW: 12.5 % (ref 11.5–15.5)
WBC: 6.7 10*3/uL (ref 4.0–10.5)

## 2017-01-06 SURGERY — OPEN REDUCTION INTERNAL FIXATION (ORIF) FIBULA FRACTURE
Anesthesia: Spinal | Laterality: Left

## 2017-01-06 MED ORDER — FENTANYL CITRATE (PF) 100 MCG/2ML IJ SOLN
50.0000 ug | INTRAMUSCULAR | Status: DC | PRN
  Administered 2017-01-06: 50 ug via INTRAVENOUS

## 2017-01-06 MED ORDER — LIDOCAINE HCL (CARDIAC) 20 MG/ML IV SOLN
INTRAVENOUS | Status: DC | PRN
  Administered 2017-01-06: 60 mg via INTRATRACHEAL

## 2017-01-06 MED ORDER — HYDROMORPHONE HCL 1 MG/ML IJ SOLN: 0.2500 mg | INTRAMUSCULAR | Status: DC | PRN

## 2017-01-06 MED ORDER — MIDAZOLAM HCL 2 MG/2ML IJ SOLN
1.0000 mg | INTRAMUSCULAR | Status: DC | PRN
  Administered 2017-01-06: 1 mg via INTRAVENOUS

## 2017-01-06 MED ORDER — PROPOFOL 10 MG/ML IV BOLUS: INTRAVENOUS | Status: DC | PRN

## 2017-01-06 MED ORDER — PROPOFOL 10 MG/ML IV BOLUS
INTRAVENOUS | Status: DC | PRN
  Administered 2017-01-06: 10 ug/kg/min via INTRAVENOUS

## 2017-01-06 MED ORDER — ONDANSETRON HCL 4 MG/2ML IJ SOLN
INTRAMUSCULAR | Status: DC | PRN
  Administered 2017-01-06: 4 mg via INTRAVENOUS

## 2017-01-06 MED ORDER — 0.9 % SODIUM CHLORIDE (POUR BTL) OPTIME
TOPICAL | Status: DC | PRN
  Administered 2017-01-06: 1000 mL

## 2017-01-06 MED ORDER — BUPIVACAINE IN DEXTROSE 0.75-8.25 % IT SOLN
INTRATHECAL | Status: DC | PRN
  Administered 2017-01-06: 1.7 mL via INTRATHECAL

## 2017-01-06 MED ORDER — LIDOCAINE 2% (20 MG/ML) 5 ML SYRINGE
INTRAMUSCULAR | Status: AC
  Filled 2017-01-06: qty 5

## 2017-01-06 MED ORDER — PHENYLEPHRINE HCL 10 MG/ML IJ SOLN
INTRAMUSCULAR | Status: DC | PRN
  Administered 2017-01-06: 50 ug/min via INTRAVENOUS

## 2017-01-06 MED ORDER — TRAMADOL HCL 50 MG PO TABS: 50.0000 mg | ORAL_TABLET | Freq: Four times a day (QID) | ORAL | 0 refills | Status: DC | PRN

## 2017-01-06 MED ORDER — CEFAZOLIN SODIUM-DEXTROSE 2-4 GM/100ML-% IV SOLN
2.0000 g | INTRAVENOUS | Status: AC
  Administered 2017-01-06: 2 g via INTRAVENOUS
  Filled 2017-01-06: qty 100

## 2017-01-06 MED ORDER — MIDAZOLAM HCL 2 MG/2ML IJ SOLN
INTRAMUSCULAR | Status: AC
  Administered 2017-01-06: 1 mg via INTRAVENOUS
  Filled 2017-01-06: qty 2

## 2017-01-06 MED ORDER — BUPIVACAINE-EPINEPHRINE (PF) 0.5% -1:200000 IJ SOLN
INTRAMUSCULAR | Status: DC | PRN
  Administered 2017-01-06: 30 mL via PERINEURAL

## 2017-01-06 MED ORDER — HYDROCODONE-ACETAMINOPHEN 5-325 MG PO TABS: 1.0000 | ORAL_TABLET | ORAL | 0 refills | Status: DC | PRN

## 2017-01-06 MED ORDER — ONDANSETRON HCL 4 MG/2ML IJ SOLN: 4.0000 mg | Freq: Four times a day (QID) | INTRAMUSCULAR | Status: DC | PRN

## 2017-01-06 MED ORDER — FENTANYL CITRATE (PF) 100 MCG/2ML IJ SOLN
50.0000 ug | INTRAMUSCULAR | Status: DC | PRN
  Filled 2017-01-06: qty 2

## 2017-01-06 MED ORDER — PROPOFOL 10 MG/ML IV BOLUS
INTRAVENOUS | Status: DC | PRN
  Administered 2017-01-06: 10 mg via INTRAVENOUS
  Administered 2017-01-06: 20 mg via INTRAVENOUS
  Administered 2017-01-06: 10 mg via INTRAVENOUS
  Administered 2017-01-06: 20 mg via INTRAVENOUS
  Administered 2017-01-06: 10 mg via INTRAVENOUS

## 2017-01-06 MED ORDER — LACTATED RINGERS IV SOLN
INTRAVENOUS | Status: DC
  Administered 2017-01-06: 11:00:00 via INTRAVENOUS

## 2017-01-06 MED ORDER — PROPOFOL 10 MG/ML IV BOLUS
INTRAVENOUS | Status: AC
Start: 2017-01-06 — End: 2017-01-06
  Filled 2017-01-06: qty 20

## 2017-01-06 MED ORDER — LACTATED RINGERS IV SOLN: INTRAVENOUS | Status: DC

## 2017-01-06 MED ORDER — HYDROCODONE-ACETAMINOPHEN 5-325 MG PO TABS
ORAL_TABLET | ORAL | Status: AC
  Filled 2017-01-06: qty 2

## 2017-01-06 MED ORDER — ONDANSETRON HCL 4 MG/2ML IJ SOLN
INTRAMUSCULAR | Status: AC
  Filled 2017-01-06: qty 2

## 2017-01-06 MED ORDER — CHLORHEXIDINE GLUCONATE 4 % EX LIQD: 60.0000 mL | Freq: Once | CUTANEOUS | Status: DC

## 2017-01-06 MED ORDER — HYDROCODONE-ACETAMINOPHEN 5-325 MG PO TABS
1.0000 | ORAL_TABLET | Freq: Once | ORAL | Status: AC
  Administered 2017-01-06: 2 via ORAL

## 2017-01-06 MED ORDER — GLYCOPYRROLATE 0.2 MG/ML IJ SOLN
INTRAMUSCULAR | Status: DC | PRN
  Administered 2017-01-06: 0.1 mg via INTRAVENOUS

## 2017-01-06 SURGICAL SUPPLY — 84 items
BANDAGE ACE 4X5 VEL STRL LF (GAUZE/BANDAGES/DRESSINGS) ×2 IMPLANT
BANDAGE ACE 6X5 VEL STRL LF (GAUZE/BANDAGES/DRESSINGS) ×2 IMPLANT
BANDAGE ELASTIC 4 VELCRO ST LF (GAUZE/BANDAGES/DRESSINGS) ×1 IMPLANT
BANDAGE ELASTIC 6 VELCRO ST LF (GAUZE/BANDAGES/DRESSINGS) ×1 IMPLANT
BIT DRILL 2.5X2.75 QC CALB (BIT) ×1 IMPLANT
BIT DRILL CALIBRATED 2.7 (BIT) ×1 IMPLANT
BLADE SURG 10 STRL SS (BLADE) ×2 IMPLANT
BLADE SURG 15 STRL LF DISP TIS (BLADE) ×1 IMPLANT
BLADE SURG 15 STRL SS (BLADE) ×2
BLADE SURG ROTATE 9660 (MISCELLANEOUS) IMPLANT
BNDG GAUZE ELAST 4 BULKY (GAUZE/BANDAGES/DRESSINGS) ×2 IMPLANT
BRUSH SCRUB DISP (MISCELLANEOUS) ×4 IMPLANT
CAST PADDING SYN 3 (CAST SUPPLIES) ×1 IMPLANT
CLEANER TIP ELECTROSURG 2X2 (MISCELLANEOUS) ×2 IMPLANT
CLSR STERI-STRIP ANTIMIC 1/2X4 (GAUZE/BANDAGES/DRESSINGS) ×1 IMPLANT
COVER MAYO STAND STRL (DRAPES) ×2 IMPLANT
DRAPE C-ARM 42X72 X-RAY (DRAPES) ×2 IMPLANT
DRAPE INCISE IOBAN 66X45 STRL (DRAPES) ×2 IMPLANT
DRAPE ORTHO SPLIT 77X108 STRL (DRAPES)
DRAPE SURG ORHT 6 SPLT 77X108 (DRAPES) IMPLANT
DRAPE U-SHAPE 47X51 STRL (DRAPES) ×2 IMPLANT
DRSG ADAPTIC 3X8 NADH LF (GAUZE/BANDAGES/DRESSINGS) ×2 IMPLANT
DRSG EMULSION OIL 3X3 NADH (GAUZE/BANDAGES/DRESSINGS) ×1 IMPLANT
DRSG PAD ABDOMINAL 8X10 ST (GAUZE/BANDAGES/DRESSINGS) ×5 IMPLANT
ELECT REM PT RETURN 9FT ADLT (ELECTROSURGICAL) ×2
ELECTRODE REM PT RTRN 9FT ADLT (ELECTROSURGICAL) ×1 IMPLANT
EVACUATOR 1/8 PVC DRAIN (DRAIN) IMPLANT
EVACUATOR 3/16  PVC DRAIN (DRAIN)
EVACUATOR 3/16 PVC DRAIN (DRAIN) IMPLANT
FIXATION ZIPTIGHT ANKLE SNDSMS (Ankle) IMPLANT
GAUZE SPONGE 4X4 12PLY STRL (GAUZE/BANDAGES/DRESSINGS) ×2 IMPLANT
GLOVE BIO SURGEON STRL SZ8 (GLOVE) ×8 IMPLANT
GLOVE BIOGEL PI IND STRL 8 (GLOVE) ×1 IMPLANT
GLOVE BIOGEL PI INDICATOR 8 (GLOVE) ×1
GOWN STRL REUS W/ TWL LRG LVL3 (GOWN DISPOSABLE) ×4 IMPLANT
GOWN STRL REUS W/TWL 2XL LVL3 (GOWN DISPOSABLE) ×2 IMPLANT
GOWN STRL REUS W/TWL LRG LVL3 (GOWN DISPOSABLE) ×8
KIT BASIN OR (CUSTOM PROCEDURE TRAY) ×2 IMPLANT
KIT ROOM TURNOVER OR (KITS) ×2 IMPLANT
MANIFOLD NEPTUNE II (INSTRUMENTS) ×2 IMPLANT
NEEDLE 22X1 1/2 (OR ONLY) (NEEDLE) IMPLANT
NS IRRIG 1000ML POUR BTL (IV SOLUTION) ×2 IMPLANT
PACK ORTHO EXTREMITY (CUSTOM PROCEDURE TRAY) ×2 IMPLANT
PAD ARMBOARD 7.5X6 YLW CONV (MISCELLANEOUS) ×4 IMPLANT
PAD CAST 4YDX4 CTTN HI CHSV (CAST SUPPLIES) ×1 IMPLANT
PADDING CAST COTTON 4X4 STRL (CAST SUPPLIES) ×2
PADDING CAST COTTON 6X4 STRL (CAST SUPPLIES) ×2 IMPLANT
PLATE LOCK 3H 95 LT DIST FIB (Plate) ×1 IMPLANT
SCREW CORT 3.5X26 (Screw) ×2 IMPLANT
SCREW CORT T15 26X3.5XST LCK (Screw) IMPLANT
SCREW LOCK CORT STAR 3.5X12 (Screw) ×1 IMPLANT
SCREW LOCK CORT STAR 3.5X14 (Screw) ×2 IMPLANT
SCREW LOCK CORT STAR 3.5X16 (Screw) ×3 IMPLANT
SCREW LOCK CORT STAR 3.5X20 (Screw) ×2 IMPLANT
SCREW LOW PROFILE 18MMX3.5MM (Screw) ×1 IMPLANT
SCREW LOW PROFILE 22MMX3.5MM (Screw) ×1 IMPLANT
SCREW NON LOCKING LP 3.5 14MM (Screw) ×1 IMPLANT
SCREW NON LOCKING LP 3.5 16MM (Screw) ×2 IMPLANT
SPLINT PLASTER CAST XFAST 5X30 (CAST SUPPLIES) IMPLANT
SPLINT PLASTER XFAST SET 5X30 (CAST SUPPLIES) ×1
SPONGE LAP 18X18 X RAY DECT (DISPOSABLE) ×2 IMPLANT
STAPLER VISISTAT 35W (STAPLE) ×2 IMPLANT
STOCKINETTE IMPERVIOUS LG (DRAPES) ×2 IMPLANT
SUCTION FRAZIER HANDLE 10FR (MISCELLANEOUS) ×1
SUCTION TUBE FRAZIER 10FR DISP (MISCELLANEOUS) ×1 IMPLANT
SUT ETHIBOND 2 0 V5 (SUTURE) ×2 IMPLANT
SUT PDS AB 2-0 CT1 27 (SUTURE) IMPLANT
SUT PROLENE 0 CT 2 (SUTURE) IMPLANT
SUT VIC AB 0 CT1 27 (SUTURE) ×4
SUT VIC AB 0 CT1 27XBRD ANBCTR (SUTURE) ×2 IMPLANT
SUT VIC AB 1 CT1 27 (SUTURE) ×4
SUT VIC AB 1 CT1 27XBRD ANBCTR (SUTURE) ×2 IMPLANT
SUT VIC AB 2-0 CT1 27 (SUTURE) ×4
SUT VIC AB 2-0 CT1 TAPERPNT 27 (SUTURE) ×2 IMPLANT
SYR 20ML ECCENTRIC (SYRINGE) IMPLANT
TOWEL OR 17X24 6PK STRL BLUE (TOWEL DISPOSABLE) ×2 IMPLANT
TOWEL OR 17X26 10 PK STRL BLUE (TOWEL DISPOSABLE) ×4 IMPLANT
TRAY CATH 16FR W/PLASTIC CATH (SET/KITS/TRAYS/PACK) ×1 IMPLANT
TRAY FOLEY CATH 16FRSI W/METER (SET/KITS/TRAYS/PACK) IMPLANT
TUBE CONNECTING 12X1/4 (SUCTIONS) ×2 IMPLANT
WATER STERILE IRR 1000ML POUR (IV SOLUTION) ×4 IMPLANT
WIRE K 1.6MM 144256 (MISCELLANEOUS) ×1 IMPLANT
YANKAUER SUCT BULB TIP NO VENT (SUCTIONS) ×2 IMPLANT
ZIPTIGHT ANKLE SYNODESMOSS FIX (Ankle) ×2 IMPLANT

## 2017-01-06 NOTE — Anesthesia Procedure Notes (Signed)
Anesthesia Regional Block:  Popliteal block  Pre-Anesthetic Checklist: ,, timeout performed, Correct Patient, Correct Site, Correct Laterality, Correct Procedure, Correct Position, site marked, Risks and benefits discussed,  Surgical consent,  Pre-op evaluation,  At surgeon's request and post-op pain management  Laterality: Left  Prep: chloraprep       Needles:  Injection technique: Single-shot  Needle Type: Echogenic Stimulator Needle     Needle Length:cm 9 cm Needle Gauge: 21 G    Additional Needles:  Procedures: ultrasound guided (picture in chart) and nerve stimulator Popliteal block  Nerve Stimulator or Paresthesia:  Response: plantar flexion of foot, 0.45 mA,   Additional Responses:   Narrative:  Start time: 01/06/2017 11:40 AM End time: 01/06/2017 11:51 AM Injection made incrementally with aspirations every 5 mL.  Performed by: Personally  Anesthesiologist: Lorelie Biermann  Additional Notes: Functioning IV was confirmed and monitors were applied.  A 63mm 21ga Arrow echogenic stimulator needle was used. Sterile prep and drape,hand hygiene and sterile gloves were used.  Negative aspiration and negative test dose prior to incremental administration of local anesthetic. The patient tolerated the procedure well.  Ultrasound guidance: relevent anatomy identified, needle position confirmed, local anesthetic spread visualized around nerve(s), vascular puncture avoided.  Image printed for medical record.

## 2017-01-06 NOTE — Anesthesia Postprocedure Evaluation (Signed)
Anesthesia Post Note  Patient: Eric [name]  Procedure(s) Performed: Procedure(s) (LRB): OPEN REDUCTION INTERNAL FIXATION (ORIF) FIBULA FRACTURE DISTAL FIBULA (Left)  Patient location during evaluation: PACU Anesthesia Type: Spinal Level of consciousness: oriented and awake and alert Pain management: pain level controlled Vital Signs Assessment: post-procedure vital signs reviewed and stable Respiratory status: spontaneous breathing, respiratory function stable and patient connected to nasal cannula oxygen Cardiovascular status: blood pressure returned to baseline and stable Postop Assessment: no headache and no backache Anesthetic complications: no       Last Vitals:  Vitals:   01/06/17 1512 01/06/17 1527  BP: 93/64 104/67  Pulse: 80 81  Resp: 20 20  Temp: 36.4 C     Last Pain:  Vitals:   01/06/17 1512  TempSrc:   PainSc: 0-No pain                 Xeng Kucher S

## 2017-01-06 NOTE — Interval H&P Note (Signed)
History and Physical Interval Note:  01/06/2017 12:49 PM  Eric [name]  has presented today for surgery, with the diagnosis of LEFT DISTAL FIBULA NON UNION  The various methods of treatment have been discussed with the patient and family. After consideration of risks, benefits and other options for treatment, the patient has consented to  Procedure(s) with comments: OPEN REDUCTION INTERNAL FIXATION (ORIF) FIBULA FRACTURE DISTAL FIBULA (Left) - Patient states has problems if he will have a tube in throat for Genera; Anesthesia as a surgical intervention .  The patient's history has been reviewed, patient examined, no change in status, stable for surgery.  I have reviewed the patient's chart and labs.  Questions were answered to the patient's satisfaction.     Rekha Hobbins G

## 2017-01-06 NOTE — OR Nursing (Signed)
1500: in&out cath=350cc cyu, per protocol, no trauma

## 2017-01-06 NOTE — Progress Notes (Signed)
Patient has regained feeling in lower extremities and moves on command.  He has voided a sufficient amount and wishes to return home as ordered.  He was taught how to ambulate using his walker without bearing weight on his operative leg.  Patient lives with his wife, but daughter is here and states that she will be off from work and able to help patient at home as needed.  Dr. Ola Spurr was notified and will see patient before discharge.

## 2017-01-06 NOTE — Op Note (Signed)
#  [id number] 

## 2017-01-06 NOTE — Anesthesia Procedure Notes (Signed)
Spinal  Patient location during procedure: OR Start time: 01/06/2017 1:09 PM End time: 01/06/2017 1:16 PM Staffing Anesthesiologist: HODIERNE, ADAM Performed: anesthesiologist  Preanesthetic Checklist Completed: patient identified, site marked, surgical consent, pre-op evaluation, timeout performed, IV checked, risks and benefits discussed and monitors and equipment checked Spinal Block Patient position: sitting Prep: DuraPrep Patient monitoring: heart rate, cardiac monitor, continuous pulse ox and blood pressure Approach: right paramedian Location: L3-4 Injection technique: single-shot Needle Needle type: Pencan  Needle gauge: 22 G Needle length: 9 cm Assessment Sensory level: T8 Additional Notes Pt tolerated the procedure well.

## 2017-01-06 NOTE — Transfer of Care (Signed)
Immediate Anesthesia Transfer of Care Note  Patient: Eric [name]  Procedure(s) Performed: Procedure(s) with comments: OPEN REDUCTION INTERNAL FIXATION (ORIF) FIBULA FRACTURE DISTAL FIBULA (Left) - Patient states has problems if he will have a tube in throat for Genera; Anesthesia  Patient Location: PACU  Anesthesia Type:Regional and Spinal  Level of Consciousness: awake, alert  and patient cooperative  Airway & Oxygen Therapy: Patient Spontanous Breathing and Patient connected to nasal cannula oxygen  Post-op Assessment: Report given to RN, Post -op Vital signs reviewed and stable and Patient able to stick tongue midline  Post vital signs: Reviewed and stable  Last Vitals:  Vitals:   01/06/17 1140 01/06/17 1145  BP: (!) 156/93 (!) 142/77  Pulse: 73 73  Resp: 20 15  Temp:      Last Pain:  Vitals:   01/06/17 1022  TempSrc:   PainSc: 5       Patients Stated Pain Goal: 5 ([id number])  Complications: No apparent anesthesia complications

## 2017-01-06 NOTE — Anesthesia Preprocedure Evaluation (Addendum)
Anesthesia Evaluation  Patient identified by MRN, date of birth, ID band Patient awake    Reviewed: Allergy & Precautions, H&P , NPO status , Patient's Chart, lab work & pertinent test results  History of Anesthesia Complications (+) DIFFICULT AIRWAY and history of anesthetic complications  Airway Mallampati: II   Neck ROM: full    Dental   Pulmonary shortness of breath,    breath sounds clear to auscultation       Cardiovascular hypertension, +CHF and + DVT  + dysrhythmias Atrial Fibrillation  Rhythm:regular Rate:Normal     Neuro/Psych PSYCHIATRIC DISORDERS Anxiety Depression    GI/Hepatic   Endo/Other    Renal/GU      Musculoskeletal  (+) Arthritis ,   Abdominal   Peds  Hematology   Anesthesia Other Findings   Reproductive/Obstetrics                             Anesthesia Physical Anesthesia Plan  ASA: III  Anesthesia Plan: Spinal   Post-op Pain Management:  Regional for Post-op pain   Induction: Intravenous  Airway Management Planned: Simple Face Mask  Additional Equipment:   Intra-op Plan:   Post-operative Plan:   Informed Consent: I have reviewed the patients History and Physical, chart, labs and discussed the procedure including the risks, benefits and alternatives for the proposed anesthesia with the patient or authorized representative who has indicated his/her understanding and acceptance.     Plan Discussed with: CRNA, Anesthesiologist and Surgeon  Anesthesia Plan Comments:        Anesthesia Quick Evaluation

## 2017-01-06 NOTE — Progress Notes (Signed)
Dr. Ola Spurr saw patient at bedside and gave permission to discharge home with family.

## 2017-01-09 ENCOUNTER — Encounter (HOSPITAL_COMMUNITY): Payer: Self-pay | Admitting: Orthopaedic Surgery

## 2017-01-09 NOTE — Op Note (Signed)
NAME:  JACORY, KAMEL NO.:  [id number]  MEDICAL RECORD NO.:  [id number]  LOCATION:                                 FACILITY:  PHYSICIAN:  Monico Blitz. Yvan Dority, M.D.DATE OF BIRTH:  03/07/1932  DATE OF PROCEDURE:  01/06/2017 DATE OF DISCHARGE:                              OPERATIVE REPORT   PREOPERATIVE DIAGNOSIS:  Left ankle distal fibula nonunion.  POSTOPERATIVE DIAGNOSIS:  Left ankle distal fibula nonunion.  PROCEDURE:  Left ankle ORIF.  ANESTHESIA:  Spinal, block, and MAC.  ATTENDING SURGEON:  Monico Blitz. Rhona Raider, M.D.  ASSISTANT:  Loni Dolly, PA.  INDICATION FOR PROCEDURE:  The patient is an 83 year old man who is many months out from left ankle fracture.  This was treated in a closed fashion, but unfortunately did not heal.  He continued with pain and swelling.  By CT he has a nonunion of his distal fibula fracture.  He is offered ORIF in hopes of getting this to heal.  Informed operative consent was obtained after discussion of possible complications including reaction to anesthesia, infection, and continued failure to heal.  SUMMARY OF FINDINGS AND PROCEDURE:  Under the aforementioned anesthetics, a left ankle ORIF was performed.  His nonunion was easily visualized.  This was thoroughly clean the devitalized tissues.  We then stabilized with Anatomic Biomet plate with multiple locking and nonlocking screws.  We used fluoroscopy throughout the case to make appropriate intraoperative decisions and read all of these views myself. Loni Dolly assisted throughout and was invaluable to the completion of the case mostly that he maintained reduction while I placed hardware. He also closed simultaneously to help minimize OR time.  DESCRIPTION OF PROCEDURE:  The patient was taken to the operating suite, where spinal anesthetic was applied.  He was also given a block preoperatively and given some sedation.  He was positioned supine with a bump under the left  hip.  He was then prepped and draped in normal sterile fashion.  After the administration of preop IV Kefzol and an appropriate time-out, the left leg was elevated, exsanguinated, tourniquet inflated about the calf.  A longitudinal incision was made centered at the fracture site with dissection down through periosteum to expose his nonunion.  Some devitalized tissue was removed with a dental pick and rongeur.  I really could not find a good place to place an interfragmentary screws safely.  I compressed the fracture site with a clamp and then applied an anatomic plate by Biomet.  This was secured to the bone with multiple locking and nonlocking screws.  This was done under fluoroscopic guidance.  We had to change several screws which appear to be too long initially.  We then stressed his mortise and it seemed to open a bit under dorsiflexion and external rotation.  I elected to place a zip tie through one of the holes in the plate over to the opposite tibial cortex.  Once this was placed and tightened appropriately, the syndesmosis was stable.  We did this with the ankle and dorsiflexion.  Fluoroscopy was used to confirm adequate reduction of the fracture and mortise.  I read these views myself.  The wound was then thoroughly irrigated followed by reapproximation of deep tissues with 0 Vicryl and subcutaneous tissues with 2-0 undyed Vicryl.  The tourniquet was deflated and a small amount of bleeding was easily controlled with Bovie cautery and some pressure.  We then closed the skin followed by Adaptic, dry gauze, and a posterior splint of plaster with ankle in neutral position.  Estimated blood loss and intraoperative fluids obtained from anesthesia records as can accurate tourniquet time.  DISPOSITION:  The patient was extubated in the operating room and taken to recovery room in stable condition.  Plans were for him to go home same-day and follow up in the office closely.  I will  contact him by phone tonight.     Monico Blitz Rhona Raider, M.D.   ______________________________ Monico Blitz. Rhona Raider, M.D.    PGD/MEDQ  D:  01/06/2017  T:  01/07/2017  Job:  [id number]

## 2017-01-13 DIAGNOSIS — S8263XD Displaced fracture of lateral malleolus of unspecified fibula, subsequent encounter for closed fracture with routine healing: Secondary | ICD-10-CM | POA: Diagnosis not present

## 2017-01-13 DIAGNOSIS — Z967 Presence of other bone and tendon implants: Secondary | ICD-10-CM | POA: Diagnosis not present

## 2017-01-23 DIAGNOSIS — S8263XD Displaced fracture of lateral malleolus of unspecified fibula, subsequent encounter for closed fracture with routine healing: Secondary | ICD-10-CM | POA: Diagnosis not present

## 2017-02-13 DIAGNOSIS — M25572 Pain in left ankle and joints of left foot: Secondary | ICD-10-CM | POA: Diagnosis not present

## 2017-03-06 DIAGNOSIS — M25572 Pain in left ankle and joints of left foot: Secondary | ICD-10-CM | POA: Diagnosis not present

## 2017-03-07 ENCOUNTER — Ambulatory Visit (INDEPENDENT_AMBULATORY_CARE_PROVIDER_SITE_OTHER): Payer: PPO | Admitting: Nurse Practitioner

## 2017-03-07 ENCOUNTER — Encounter: Payer: Self-pay | Admitting: Nurse Practitioner

## 2017-03-07 VITALS — BP 128/76 | HR 63 | Temp 97.5°F | Resp 19 | Ht 66.0 in | Wt 168.0 lb

## 2017-03-07 DIAGNOSIS — M48062 Spinal stenosis, lumbar region with neurogenic claudication: Secondary | ICD-10-CM | POA: Diagnosis not present

## 2017-03-07 DIAGNOSIS — Z967 Presence of other bone and tendon implants: Secondary | ICD-10-CM | POA: Diagnosis not present

## 2017-03-07 DIAGNOSIS — I5032 Chronic diastolic (congestive) heart failure: Secondary | ICD-10-CM | POA: Diagnosis not present

## 2017-03-07 DIAGNOSIS — Z9889 Other specified postprocedural states: Secondary | ICD-10-CM | POA: Insufficient documentation

## 2017-03-07 DIAGNOSIS — I480 Paroxysmal atrial fibrillation: Secondary | ICD-10-CM

## 2017-03-07 DIAGNOSIS — R600 Localized edema: Secondary | ICD-10-CM | POA: Diagnosis not present

## 2017-03-07 DIAGNOSIS — Z8781 Personal history of (healed) traumatic fracture: Secondary | ICD-10-CM | POA: Diagnosis not present

## 2017-03-07 NOTE — Progress Notes (Signed)
Careteam: Patient Care Team: [name], DO as PCP - General (Geriatric Medicine) [name], MD as Consulting Physician (Cardiology) Normajean Glasgow, MD as Attending Physician (Physical Medicine and Rehabilitation) Phylliss Bob, MD as Consulting Physician (Orthopedic Surgery)  Advanced Directive information Does Patient Have a Medical Advance Directive?: No  Allergies  Allergen Reactions  . Morphine And Related Shortness Of Breath  . Percocet [Oxycodone-Acetaminophen] Shortness Of Breath  . Valium Shortness Of Breath    Chief Complaint  Patient presents with  . Acute Visit    Pt is being seen due to right ankle being swollen, some SOB with activitiy.      HPI: Patient is a 83 y.o. male seen in the office today for swelling and pain in his right foot and increasing SOB over the last 4 to 5 months since a left ankle tibial fracture. Pt has a history of HTN, paroxsymal afib not on anticoagulation, CHF, cervical myelopathy and radiculopathy, DDD, and recent left ankle distal fibula with nonuion healing resulting in a left ORIF one month ago. He is now in an ambulation boot and was seen by ortho this week. He believes that his swelling and pain in the right foot are do to the overuse that it has endured over the last 8 months since the start of this injury, as he is using his right foot for more mobilization. He states that before his left leg injury, he was very active, including mowing his own grass and other physical attivities. Since his injury, he has not left the house other than doctors appointment in the last 8 months and appears deconditioned. He has noticed a gradual increase in his exertional SOB since this time.   Review of Systems:  Review of Systems  Constitutional: Positive for activity change and appetite change. Negative for chills, diaphoresis, fatigue, fever and unexpected weight change.  Respiratory: Positive for shortness of breath and wheezing. Negative for  choking, chest tightness and stridor.   Cardiovascular: Negative for chest pain, palpitations and leg swelling.  Skin: Negative for color change, pallor and rash.  Neurological: Positive for numbness.       Bilateral lower extremity numbness due to radiculopathy    Past Medical History:  Diagnosis Date  . Anxiety   . Arthritis   . Atrial fibrillation ([place])   . Chronic diastolic CHF (congestive heart failure) ([place]) 01/19/2015  . DDD (degenerative disc disease), cervical   . Depressive disorder, not elsewhere classified   . Depressive disorder, not elsewhere classified   . Difficult intubation   . DVT (deep venous thrombosis) ([place])   . Dysphagia, oral phase   . Dyspnea   . Edema   . Family history of other cardiovascular diseases(V17.49)   . History of kidney stones   . Hypertension   . Hypoxemia   . Impacted cerumen   . Mixed hyperlipidemia   . 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 Other dyspnea and respiratory abnormality   . Other myelopathy   . PAF (paroxysmal atrial fibrillation) ([place])    a. 01/2011 Echo: EF 50-55%, Gr 1 DD, Mild AI/MR/TR.  Marland Kitchen Pain in limb   . Palpitations   . Pneumonia 03/06/2003  . Spinal stenosis, unspecified region other than cervical   . 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 Thoracic or lumbosacral neuritis or radiculitis, unspecified    Past Surgical History:  Procedure Laterality Date  . CATARACT EXTRACTION W/ INTRAOCULAR LENS  IMPLANT, BILATERAL    . CERVICAL FUSION  02/10/2011  . history of abd ultrasound  11/01   fatty liver  . MULTIPLE TOOTH EXTRACTIONS    . ORIF FIBULA FRACTURE Left 01/06/2017   Procedure: OPEN REDUCTION INTERNAL FIXATION (ORIF) FIBULA FRACTURE DISTAL FIBULA;  Surgeon: Melrose Nakayama, MD;  Location: [facility];  Service: Orthopedics;  Laterality: Left;  Patient states has problems if he will have a tube in throat for Genera; Anesthesia   Social History:    reports that he has never smoked. He has never used smokeless tobacco. He reports that he does not drink alcohol or use drugs.  Family History  Problem Relation Age of Onset  . Stroke Father   . Atrial fibrillation Brother     on coumadin  . Heart disease Brother     AFib- coumadin  . Stroke Paternal Grandfather   . Cancer Other     colon cancer at early age    Medications: Patient's Medications  New Prescriptions   No medications on file  Previous Medications   ASPIRIN 81 MG TABLET    Take 1 tablet (81 mg total) by mouth daily.   CHOLECALCIFEROL (VITAMIN D) 1000 UNITS CAPSULE    Take one tablet once daily   GABAPENTIN (NEURONTIN) 600 MG TABLET    Take 1 tablet (600 mg total) by mouth 3 (three) times daily. For nerve pain   HYDROCODONE-ACETAMINOPHEN (NORCO) 5-325 MG TABLET    Take 1-2 tablets by mouth every 4 (four) hours as needed for severe pain.   METOPROLOL (LOPRESSOR) 100 MG TABLET    Take one tablet by mouth twice daily for heart   PRAVASTATIN (PRAVACHOL) 40 MG TABLET    Take 1 tablet (40 mg total) by mouth daily.   TRAMADOL (ULTRAM) 50 MG TABLET    Take 1 tablet (50 mg total) by mouth every 6 (six) hours as needed for moderate pain or severe pain. For Back Pain  Modified Medications   No medications on file  Discontinued Medications   No medications on file     Physical Exam:  Vitals:   03/07/17 1104  BP: 128/76  Pulse: 63  Resp: 19  Temp: 97.5 F (36.4 C)  TempSrc: Oral  SpO2: 98%  Weight: 168 lb (76.2 kg)  Height: 5\' 6"  (1.676 m)   Body mass index is 27.12 kg/m.  Physical Exam  Constitutional: He is oriented to person, place, and time. He appears well-developed and well-nourished. No distress.  HENT:  Head: Normocephalic and atraumatic.  Right Ear: External ear normal.  Left Ear: External ear normal.  Eyes: Conjunctivae and EOM are normal. Pupils are equal, round, and reactive to light.  Cardiovascular: Normal rate, normal heart sounds and intact  distal pulses.   Pulmonary/Chest: Effort normal and breath sounds normal. No respiratory distress. He has no wheezes. He has no rales. He exhibits no tenderness.  Musculoskeletal: He exhibits edema.       Right foot: There is swelling. There is normal range of motion, no tenderness, normal capillary refill, no crepitus and no deformity.  Neurological: He is alert and oriented to person, place, and time. A sensory deficit is present. GCS eye subscore is 4. GCS verbal subscore is 5. GCS motor subscore is 6.  Decreased proprioception to the right toes and mid foot.    Skin: Skin is warm, dry and intact. He is not diaphoretic.    Labs  reviewed: Basic Metabolic Panel:  Recent Labs  05/16/16 1035 01/06/17 1026  NA 140 140  K 4.5 4.5  CL 100 105  CO2 23 26  GLUCOSE 83 86  BUN 22 15  CREATININE 0.97 0.99  CALCIUM 9.1 9.3   Liver Function Tests:  Recent Labs  05/16/16 1035  AST 26  ALT 36  ALKPHOS 45  BILITOT 0.8  PROT 6.5  ALBUMIN 4.0   No results for input(s): LIPASE, AMYLASE in the last 8760 hours. No results for input(s): AMMONIA in the last 8760 hours. CBC:  Recent Labs  05/16/16 1035 01/06/17 1026  WBC 6.4 6.7  NEUTROABS 3.5  --   HGB  --  15.2  HCT 43.2 45.2  MCV 95 94.8  PLT 208 182   Lipid Panel:  Recent Labs  05/16/16 1035  CHOL 169  HDL 37*  LDLCALC 90  TRIG 210*  CHOLHDL 4.6   TSH: No results for input(s): TSH in the last 8760 hours. A1C: Lab Results  Component Value Date   HGBA1C 5.7 (H) 05/16/2016     Assessment/Plan 1. S/P ORIF (open reduction internal fixation) fracture -S/p surgical ORIF 01/06/17 - Ambulatory referral to [facility] -Next ortho appointment 03/27/17  2. Leg edema, right -Most likely due to increased use of right foot, however pt has multiple risk factors. Well's score=0, low risk -Obtain vascular ultrasound of the right lower extremity due to several risk factors (recent orthopedic surgery, sedentary and hx of afib)  to rule out DVT -Elevate leg higher than heart level at lest three times per day for 20-30 minutes at a time.  -Wear compression stockings first thing in the morning, continue throughout the day. Take them off at night.  -Increase protein in your diet, monitor salt intake to decrease the risk of swelling.   3. Chronic diastolic CHF (congestive heart failure) ([facility]) -Increasing SOB most likely related to deconditioning over the last several months related to left ankle fracture. Lungs sound clear with edema noted to right LE. No cough or congestion.  conts on lopressor BID -to notify for worsening shortness of breath cough or congestion -Will order home health PT evaluation   4. Spinal stenosis, lumbar region, with neurogenic claudication -Stable. Pt has ongoing parasthesias in the right toes and mid foot. This is a chronic condition for him.   5. Paroxysmal atrial fibrillation ([facility]) -NSR today. Pt is followed by cardiology, next appointment 05/27/17. conts on metoprolol.    Carlos American. Harle Battiest  [facility] [phone] 8 am - 5 pm) [phone] (after hours)

## 2017-03-07 NOTE — Patient Instructions (Signed)
To elevate legs above level of heart Use compression hose Minimize sodium intake   Will get doppler of leg to rule out clot  Will get home health referral

## 2017-03-09 ENCOUNTER — Ambulatory Visit (HOSPITAL_COMMUNITY)
Admission: RE | Admit: 2017-03-09 | Discharge: 2017-03-09 | Disposition: A | Discharge status: Home / Self Care | Payer: PPO | Source: Ambulatory Visit | Attending: Nurse Practitioner | Admitting: Nurse Practitioner

## 2017-03-09 ENCOUNTER — Encounter (HOSPITAL_COMMUNITY): Payer: Medicare PPO

## 2017-03-09 DIAGNOSIS — R600 Localized edema: Secondary | ICD-10-CM

## 2017-03-09 DIAGNOSIS — E785 Hyperlipidemia, unspecified: Secondary | ICD-10-CM | POA: Insufficient documentation

## 2017-03-09 DIAGNOSIS — I10 Essential (primary) hypertension: Secondary | ICD-10-CM | POA: Insufficient documentation

## 2017-03-13 ENCOUNTER — Telehealth: Payer: Self-pay

## 2017-03-13 ENCOUNTER — Other Ambulatory Visit: Payer: Self-pay | Admitting: Nurse Practitioner

## 2017-03-13 DIAGNOSIS — R0602 Shortness of breath: Secondary | ICD-10-CM

## 2017-03-13 NOTE — Telephone Encounter (Signed)
Okay to get chest xray, will place order, he can walk in to get this today

## 2017-03-13 NOTE — Telephone Encounter (Signed)
Noted, probably needs to have appt asap also.  ?CHF exacerbation.  Has he gained weight?

## 2017-03-13 NOTE — Telephone Encounter (Addendum)
Discussed results with patient, patient verbalized understanding of results   ----- Message from Lauree Chandler, NP sent at 03/10/2017 10:03 AM EDT ----- No clot noted on doppler

## 2017-03-13 NOTE — Telephone Encounter (Signed)
Patient c/o SOB, unable to walk 10 feet without stopping to catch breath. Patient with SOB with minimal activity: putting on shoes, going to bathroom, or walking down a ramp.  SOB worse x 2-3 weeks with wheezing, SOB was mentioned at last OV with Sherrie Mustache, NP (refer to note dated 03/07/17). Patient would like an order for Chest Xray, please advise

## 2017-03-13 NOTE — Telephone Encounter (Signed)
Spoke with patient he scheduled appt and stated that he will go and have chest Xray.

## 2017-03-14 ENCOUNTER — Telehealth: Payer: Self-pay

## 2017-03-14 ENCOUNTER — Ambulatory Visit
Admission: RE | Admit: 2017-03-14 | Discharge: 2017-03-14 | Disposition: A | Discharge status: Home / Self Care | Payer: PPO | Source: Ambulatory Visit | Attending: Nurse Practitioner | Admitting: Nurse Practitioner

## 2017-03-14 DIAGNOSIS — S82832K Other fracture of upper and lower end of left fibula, subsequent encounter for closed fracture with nonunion: Secondary | ICD-10-CM | POA: Diagnosis not present

## 2017-03-14 DIAGNOSIS — R05 Cough: Secondary | ICD-10-CM | POA: Diagnosis not present

## 2017-03-14 DIAGNOSIS — I480 Paroxysmal atrial fibrillation: Secondary | ICD-10-CM | POA: Diagnosis not present

## 2017-03-14 DIAGNOSIS — M4712 Other spondylosis with myelopathy, cervical region: Secondary | ICD-10-CM | POA: Diagnosis not present

## 2017-03-14 DIAGNOSIS — Z981 Arthrodesis status: Secondary | ICD-10-CM | POA: Diagnosis not present

## 2017-03-14 DIAGNOSIS — I5032 Chronic diastolic (congestive) heart failure: Secondary | ICD-10-CM | POA: Diagnosis not present

## 2017-03-14 DIAGNOSIS — R0602 Shortness of breath: Secondary | ICD-10-CM

## 2017-03-14 DIAGNOSIS — M5010 Cervical disc disorder with radiculopathy, unspecified cervical region: Secondary | ICD-10-CM | POA: Diagnosis not present

## 2017-03-14 DIAGNOSIS — Z961 Presence of intraocular lens: Secondary | ICD-10-CM | POA: Diagnosis not present

## 2017-03-14 DIAGNOSIS — Z792 Long term (current) use of antibiotics: Secondary | ICD-10-CM | POA: Diagnosis not present

## 2017-03-14 DIAGNOSIS — M25571 Pain in right ankle and joints of right foot: Secondary | ICD-10-CM | POA: Diagnosis not present

## 2017-03-14 DIAGNOSIS — M48062 Spinal stenosis, lumbar region with neurogenic claudication: Secondary | ICD-10-CM | POA: Diagnosis not present

## 2017-03-14 DIAGNOSIS — I110 Hypertensive heart disease with heart failure: Secondary | ICD-10-CM | POA: Diagnosis not present

## 2017-03-14 DIAGNOSIS — Z79891 Long term (current) use of opiate analgesic: Secondary | ICD-10-CM | POA: Diagnosis not present

## 2017-03-14 DIAGNOSIS — Z7982 Long term (current) use of aspirin: Secondary | ICD-10-CM | POA: Diagnosis not present

## 2017-03-14 MED ORDER — MOXIFLOXACIN HCL 400 MG PO TABS: 400.0000 mg | ORAL_TABLET | Freq: Every day | ORAL | 0 refills | Status: DC

## 2017-03-14 NOTE — Telephone Encounter (Signed)
Refer to Chest Xray results, pending appointment was rescheduled until antibiotic completion

## 2017-03-14 NOTE — Telephone Encounter (Signed)
-----   Message from Lauree Chandler, NP sent at 03/14/2017 11:50 AM EDT ----- Pneumonia on chest xray, start avelox 400 mg daily for 1 week  To use florastore twice daily for 1 week To use mucinex by mouth with full glass of water x1 week

## 2017-03-14 NOTE — Telephone Encounter (Signed)
Prescription has been sent to pharmacy and patient has been notified.

## 2017-03-16 ENCOUNTER — Ambulatory Visit: Payer: Self-pay | Admitting: Internal Medicine

## 2017-03-20 DIAGNOSIS — Z7982 Long term (current) use of aspirin: Secondary | ICD-10-CM | POA: Diagnosis not present

## 2017-03-20 DIAGNOSIS — M48062 Spinal stenosis, lumbar region with neurogenic claudication: Secondary | ICD-10-CM | POA: Diagnosis not present

## 2017-03-20 DIAGNOSIS — Z792 Long term (current) use of antibiotics: Secondary | ICD-10-CM | POA: Diagnosis not present

## 2017-03-20 DIAGNOSIS — M4712 Other spondylosis with myelopathy, cervical region: Secondary | ICD-10-CM | POA: Diagnosis not present

## 2017-03-20 DIAGNOSIS — I110 Hypertensive heart disease with heart failure: Secondary | ICD-10-CM | POA: Diagnosis not present

## 2017-03-20 DIAGNOSIS — Z981 Arthrodesis status: Secondary | ICD-10-CM | POA: Diagnosis not present

## 2017-03-20 DIAGNOSIS — I5032 Chronic diastolic (congestive) heart failure: Secondary | ICD-10-CM | POA: Diagnosis not present

## 2017-03-20 DIAGNOSIS — S82832K Other fracture of upper and lower end of left fibula, subsequent encounter for closed fracture with nonunion: Secondary | ICD-10-CM | POA: Diagnosis not present

## 2017-03-20 DIAGNOSIS — M25571 Pain in right ankle and joints of right foot: Secondary | ICD-10-CM | POA: Diagnosis not present

## 2017-03-20 DIAGNOSIS — Z961 Presence of intraocular lens: Secondary | ICD-10-CM | POA: Diagnosis not present

## 2017-03-20 DIAGNOSIS — I480 Paroxysmal atrial fibrillation: Secondary | ICD-10-CM | POA: Diagnosis not present

## 2017-03-20 DIAGNOSIS — M5010 Cervical disc disorder with radiculopathy, unspecified cervical region: Secondary | ICD-10-CM | POA: Diagnosis not present

## 2017-03-20 DIAGNOSIS — Z79891 Long term (current) use of opiate analgesic: Secondary | ICD-10-CM | POA: Diagnosis not present

## 2017-03-27 ENCOUNTER — Encounter: Payer: Self-pay | Admitting: Internal Medicine

## 2017-03-27 ENCOUNTER — Ambulatory Visit (INDEPENDENT_AMBULATORY_CARE_PROVIDER_SITE_OTHER): Payer: PPO | Admitting: Internal Medicine

## 2017-03-27 VITALS — BP 128/62 | HR 61 | Temp 98.2°F | Wt 183.0 lb

## 2017-03-27 DIAGNOSIS — R079 Chest pain, unspecified: Secondary | ICD-10-CM

## 2017-03-27 DIAGNOSIS — M25572 Pain in left ankle and joints of left foot: Secondary | ICD-10-CM | POA: Diagnosis not present

## 2017-03-27 DIAGNOSIS — I480 Paroxysmal atrial fibrillation: Secondary | ICD-10-CM | POA: Diagnosis not present

## 2017-03-27 DIAGNOSIS — I5033 Acute on chronic diastolic (congestive) heart failure: Secondary | ICD-10-CM | POA: Diagnosis not present

## 2017-03-27 LAB — CBC WITH DIFFERENTIAL/PLATELET
Basophils Absolute: 69 cells/uL (ref 0–200)
Basophils Relative: 1 %
Eosinophils Absolute: 69 cells/uL (ref 15–500)
Eosinophils Relative: 1 %
HCT: 42.8 % (ref 38.5–50.0)
Hemoglobin: 14.2 g/dL (ref 13.2–17.1)
Lymphocytes Relative: 38 %
Lymphs Abs: 2622 cells/uL (ref 850–3900)
MCH: 32.3 pg (ref 27.0–33.0)
MCHC: 33.2 g/dL (ref 32.0–36.0)
MCV: 97.3 fL (ref 80.0–100.0)
MPV: 10.3 fL (ref 7.5–12.5)
Monocytes Absolute: 552 cells/uL (ref 200–950)
Monocytes Relative: 8 %
Neutro Abs: 3588 cells/uL (ref 1500–7800)
Neutrophils Relative %: 52 %
Platelets: 207 10*3/uL (ref 140–400)
RBC: 4.4 MIL/uL (ref 4.20–5.80)
RDW: 13.5 % (ref 11.0–15.0)
WBC: 6.9 10*3/uL (ref 3.8–10.8)

## 2017-03-27 MED ORDER — FUROSEMIDE 40 MG PO TABS: 40.0000 mg | ORAL_TABLET | Freq: Every day | ORAL | 0 refills | Status: DC

## 2017-03-27 MED ORDER — POTASSIUM CHLORIDE CRYS ER 20 MEQ PO TBCR: 20.0000 meq | EXTENDED_RELEASE_TABLET | Freq: Every day | ORAL | 0 refills | Status: DC

## 2017-03-27 NOTE — Patient Instructions (Addendum)
Weigh yourself daily at home beginning when you get up tomorrow.  Record the results. Avoid high sodium (salt) foods.  Elevate your feet at rest. Take lasix 40mg  daily for three days starting when you pick it up today Also take potassium 41meq daily for 3 days beginning today See your cardiology office later this week.

## 2017-03-27 NOTE — Progress Notes (Signed)
Location:  [facility] clinic Provider: Severiano Utsey L. Mariea Clonts, D.O., C.M.D.  Goals of Care:  Advanced Directives 03/27/2017  Does Patient Have a Medical Advance Directive? No  Type of Advance Directive -  Does patient want to make changes to medical advance directive? -  Copy of [place] in Chart? -  Would patient like information on creating a medical advance directive? No - Patient declined     Chief Complaint  Patient presents with  . Follow-up    pneumonia    HPI: Patient is a 83 y.o. male seen today for an acute visit for f/u on pneumonia.  He does not think he's over it.  He got sob just walking in her.  He's supposed to do three laps per PT.  He's coughing all the time.  Nonproductive cough.  He notes he has his heart skipping sometimes.  Also feels weight on his chest at this point.  Completed avelox on 4/10 (started 4/3).  Weight up to 183 lbs from 168 lbs on 3/27.     Past Medical History:  Diagnosis Date  . Anxiety   . Arthritis   . Atrial fibrillation ([place])   . Chronic diastolic CHF (congestive heart failure) ([place]) 01/19/2015  . DDD (degenerative disc disease), cervical   . Depressive disorder, not elsewhere classified   . Depressive disorder, not elsewhere classified   . Difficult intubation   . DVT (deep venous thrombosis) ([place])   . Dysphagia, oral phase   . Dyspnea   . Edema   . Family history of other cardiovascular diseases(V17.49)   . History of kidney stones   . Hypertension   . Hypoxemia   . Impacted cerumen   . Mixed hyperlipidemia   . 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 Other dyspnea and respiratory abnormality   . Other myelopathy   . PAF (paroxysmal atrial fibrillation) ([place])    a. 01/2011 Echo: EF 50-55%, Gr 1 DD, Mild AI/MR/TR.  Marland Kitchen Pain in limb   . Palpitations   . Pneumonia 03/06/2003  . Spinal stenosis, unspecified region other than cervical   . 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 Thoracic or lumbosacral neuritis or radiculitis, unspecified     Past Surgical History:  Procedure Laterality Date  . CATARACT EXTRACTION W/ INTRAOCULAR LENS  IMPLANT, BILATERAL    . CERVICAL FUSION  02/10/2011  . history of abd ultrasound  11/01   fatty liver  . MULTIPLE TOOTH EXTRACTIONS    . ORIF FIBULA FRACTURE Left 01/06/2017   Procedure: OPEN REDUCTION INTERNAL FIXATION (ORIF) FIBULA FRACTURE DISTAL FIBULA;  Surgeon: Melrose Nakayama, MD;  Location: [place];  Service: Orthopedics;  Laterality: Left;  Patient states has problems if he will have a tube in throat for Genera; Anesthesia    Allergies  Allergen Reactions  . Morphine And Related Shortness Of Breath  . Percocet [Oxycodone-Acetaminophen] Shortness Of Breath  . Valium Shortness Of Breath    Allergies as of 03/27/2017      Reactions   Morphine And Related Shortness Of Breath   Percocet [oxycodone-acetaminophen] Shortness Of Breath   Valium Shortness Of Breath      Medication List       Accurate as of 03/27/17  2:39 PM. Always use your most recent med list.          aspirin 81 MG tablet Take 1 tablet (81 mg  total) by mouth daily.   gabapentin 600 MG tablet Commonly known as:  NEURONTIN Take 1 tablet (600 mg total) by mouth 3 (three) times daily. For nerve pain   HYDROcodone-acetaminophen 5-325 MG tablet Commonly known as:  NORCO Take 1-2 tablets by mouth every 4 (four) hours as needed for severe pain.   metoprolol 100 MG tablet Commonly known as:  LOPRESSOR Take one tablet by mouth twice daily for heart   moxifloxacin 400 MG tablet Commonly known as:  AVELOX Take 1 tablet (400 mg total) by mouth daily at 8 pm.   pravastatin 40 MG tablet Commonly known as:  PRAVACHOL Take 1 tablet (40 mg total) by mouth daily.   traMADol 50 MG tablet Commonly known as:  ULTRAM Take 1 tablet (50 mg total) by mouth every 6 (six) hours as needed for moderate pain or severe pain. For Back Pain     Vitamin D 1000 units capsule Take one tablet once daily       Review of Systems:  Review of Systems  Constitutional: Negative for chills and fever.  HENT: Negative for congestion.   Respiratory: Positive for cough and shortness of breath. Negative for sputum production and wheezing.   Cardiovascular: Positive for chest pain, palpitations and leg swelling. Negative for orthopnea, claudication and PND.       DOE  Gastrointestinal: Negative for abdominal pain.  Genitourinary: Negative for dysuria.  Musculoskeletal: Negative for falls.       Left leg fx in ace wrap and boot said to now be healing  Neurological: Negative for dizziness.  Psychiatric/Behavioral: Negative for memory loss.    Health Maintenance  Topic Date Due  . COLONOSCOPY  08/30/2015  . INFLUENZA VACCINE  07/12/2017  . TETANUS/TDAP  10/15/2023  . PNA vac Low Risk Adult  Completed    Physical Exam: Vitals:   03/27/17 1430  BP: 128/62  Pulse: 61  Temp: 98.2 F (36.8 C)  TempSrc: Oral  SpO2: 98%  Weight: 183 lb (83 kg)   Body mass index is 29.54 kg/m. Physical Exam  Constitutional: He is oriented to person, place, and time. He appears well-developed and well-nourished.  Cardiovascular: Normal rate and regular rhythm.   Pulmonary/Chest:  Dyspneic on exertion walking in, sats wnl at 98%, edema significant in bilateral LEs with ace wrap on left under boot; both feet so edematous that velcro on shoe not wanting to close; right base rales present  Musculoskeletal:  Walking with walker, has boot on left leg   Neurological: He is alert and oriented to person, place, and time.  Skin: Skin is warm and dry.  Psychiatric: He has a normal mood and affect.    Labs reviewed: Basic Metabolic Panel:  Recent Labs  05/16/16 1035 01/06/17 1026  NA 140 140  K 4.5 4.5  CL 100 105  CO2 23 26  GLUCOSE 83 86  BUN 22 15  CREATININE 0.97 0.99  CALCIUM 9.1 9.3   Liver Function Tests:  Recent Labs   05/16/16 1035  AST 26  ALT 36  ALKPHOS 45  BILITOT 0.8  PROT 6.5  ALBUMIN 4.0   No results for input(s): LIPASE, AMYLASE in the last 8760 hours. No results for input(s): AMMONIA in the last 8760 hours. CBC:  Recent Labs  05/16/16 1035 01/06/17 1026  WBC 6.4 6.7  NEUTROABS 3.5  --   HGB  --  15.2  HCT 43.2 45.2  MCV 95 94.8  PLT 208 182   Lipid Panel:  Recent Labs  05/16/16 1035  CHOL 169  HDL 37*  LDLCALC 90  TRIG 210*  CHOLHDL 4.6   Lab Results  Component Value Date   HGBA1C 5.7 (H) 05/16/2016    Procedures since last visit: Dg Chest 2 View  Result Date: 03/14/2017 CLINICAL DATA:  Worsening shortness of breath and cough over the last 3 weeks. EXAM: CHEST  2 VIEW COMPARISON:  02/16/2011 FINDINGS: Mild cardiomegaly. Unfolded aorta. Patchy infiltrate in both lower lobes posteriorly consistent with mild lower lobe pneumonia. Upper lungs are clear. No effusions. No acute bone finding. IMPRESSION: Patchy posterior lower lobe infiltrates bilaterally consistent with pneumonia. Electronically Signed   By: Nelson Chimes M.D.   On: 03/14/2017 10:25   EKG NSR with pvcs, left axis at 64bpm  Assessment/Plan 1. Acute on chronic diastolic CHF (congestive heart failure), NYHA class 2 (HCC) - lasix 40mg  daily x 3 days, kcl 1meq po daily x 3 days - qam weights  - avoid high sodium foods and adding salt  - EKG 12-Lead - CBC with Differential/Platelet - Basic metabolic panel -f/u with Dr. Donivan Scull office in [place] later this week for reeval  2. Chest pain, unspecified type - coming and going, suspect due to his volume overload - no physical signs of residual pneumonia, but check cbc, bmp - EKG 12-Lead - CBC with Differential/Platelet - Basic metabolic panel  3. Paroxysmal atrial fibrillation (HCC) - on baby asa only, EKG Turano be afib today w/ unclear P waves, but last one looks that way, too - CBC with Differential/Platelet - Basic metabolic panel  Labs/tests  ordered:   Orders Placed This Encounter  Procedures  . CBC with Differential/Platelet  . Basic metabolic panel    Order Specific Question:   Has the patient fasted?    Answer:   Yes  . EKG 12-Lead    Order Specific Question:   Where should this test be performed    Answer:   Other    Next appt: 2 wks f/u chf  Logann Whitebread L. Dora Simeone, D.O. [facility] [address], [place] [address] Cell Phone (Mon-Fri 8am-5pm):  [phone] On Call:  [phone] & follow prompts after 5pm & weekends Office Phone:  [phone] Office Fax:  [phone]

## 2017-03-28 DIAGNOSIS — S82832K Other fracture of upper and lower end of left fibula, subsequent encounter for closed fracture with nonunion: Secondary | ICD-10-CM | POA: Diagnosis not present

## 2017-03-28 DIAGNOSIS — Z79891 Long term (current) use of opiate analgesic: Secondary | ICD-10-CM | POA: Diagnosis not present

## 2017-03-28 DIAGNOSIS — Z792 Long term (current) use of antibiotics: Secondary | ICD-10-CM | POA: Diagnosis not present

## 2017-03-28 DIAGNOSIS — M5010 Cervical disc disorder with radiculopathy, unspecified cervical region: Secondary | ICD-10-CM | POA: Diagnosis not present

## 2017-03-28 DIAGNOSIS — I5032 Chronic diastolic (congestive) heart failure: Secondary | ICD-10-CM | POA: Diagnosis not present

## 2017-03-28 DIAGNOSIS — M4712 Other spondylosis with myelopathy, cervical region: Secondary | ICD-10-CM | POA: Diagnosis not present

## 2017-03-28 DIAGNOSIS — Z981 Arthrodesis status: Secondary | ICD-10-CM | POA: Diagnosis not present

## 2017-03-28 DIAGNOSIS — M48062 Spinal stenosis, lumbar region with neurogenic claudication: Secondary | ICD-10-CM | POA: Diagnosis not present

## 2017-03-28 DIAGNOSIS — I480 Paroxysmal atrial fibrillation: Secondary | ICD-10-CM | POA: Diagnosis not present

## 2017-03-28 DIAGNOSIS — M25571 Pain in right ankle and joints of right foot: Secondary | ICD-10-CM | POA: Diagnosis not present

## 2017-03-28 DIAGNOSIS — Z961 Presence of intraocular lens: Secondary | ICD-10-CM | POA: Diagnosis not present

## 2017-03-28 DIAGNOSIS — Z7982 Long term (current) use of aspirin: Secondary | ICD-10-CM | POA: Diagnosis not present

## 2017-03-28 DIAGNOSIS — I110 Hypertensive heart disease with heart failure: Secondary | ICD-10-CM | POA: Diagnosis not present

## 2017-03-28 LAB — BASIC METABOLIC PANEL
BUN: 24 mg/dL (ref 7–25)
CO2: 21 mmol/L (ref 20–31)
Calcium: 9.2 mg/dL (ref 8.6–10.3)
Chloride: 109 mmol/L (ref 98–110)
Creat: 1 mg/dL (ref 0.70–1.11)
Glucose, Bld: 94 mg/dL (ref 65–99)
Potassium: 4.9 mmol/L (ref 3.5–5.3)
Sodium: 145 mmol/L (ref 135–146)

## 2017-03-29 ENCOUNTER — Ambulatory Visit (INDEPENDENT_AMBULATORY_CARE_PROVIDER_SITE_OTHER): Payer: PPO | Admitting: Nurse Practitioner

## 2017-03-29 ENCOUNTER — Encounter: Payer: Self-pay | Admitting: Nurse Practitioner

## 2017-03-29 ENCOUNTER — Telehealth: Payer: Self-pay | Admitting: Nurse Practitioner

## 2017-03-29 VITALS — BP 110/52 | HR 63 | Ht 67.0 in | Wt 180.0 lb

## 2017-03-29 DIAGNOSIS — I5033 Acute on chronic diastolic (congestive) heart failure: Secondary | ICD-10-CM | POA: Diagnosis not present

## 2017-03-29 DIAGNOSIS — I10 Essential (primary) hypertension: Secondary | ICD-10-CM

## 2017-03-29 DIAGNOSIS — I480 Paroxysmal atrial fibrillation: Secondary | ICD-10-CM | POA: Diagnosis not present

## 2017-03-29 MED ORDER — APIXABAN 5 MG PO TABS: 5.0000 mg | ORAL_TABLET | Freq: Two times a day (BID) | ORAL | 3 refills | Status: DC

## 2017-03-29 MED ORDER — FUROSEMIDE 40 MG PO TABS: 40.0000 mg | ORAL_TABLET | Freq: Every day | ORAL | 3 refills | Status: DC

## 2017-03-29 NOTE — Progress Notes (Signed)
Office Visit    Patient Name: Eric [name] Date of Encounter: 03/29/2017  Primary Care Provider:  Hollace Kinnier, DO Primary Cardiologist:  Johnny Bridge, MD   Chief Complaint    83 y/o ? with a h/o diast CHF, PAF, HTN, and HL, who presents for f/u related to a 1-2 month h/o progressive dyspnea, wt gain, and lower ext edema.  Past Medical History    Past Medical History:  Diagnosis Date  . Anxiety   . Arthritis   . Chronic diastolic CHF (congestive heart failure) ([place])    a. 01/2011 Echo: EF 50-55%, gr1 DD, mild AI, nl RV fxn, mild TR/PR.  . DDD (degenerative disc disease), cervical   . Depressive disorder, not elsewhere classified   . Depressive disorder, not elsewhere classified   . Difficult intubation   . DVT (deep venous thrombosis) ([place])   . Dysphagia, oral phase   . Dyspnea   . Edema   . History of kidney stones   . Hypertension   . Hypoxemia   . Impacted cerumen   . Mixed hyperlipidemia   . 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 Other myelopathy   . Pain in limb   . Palpitations   . Paroxysmal Atrial Fibrillation ([place])    a. a. 01/2011 in setting of post-op complications including aspiration pna;  b. CHA2DS2VASc = 4.  . Pneumonia 03/06/2003  . Spinal stenosis, unspecified region other than cervical   . 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 Thoracic or lumbosacral neuritis or radiculitis, unspecified    Past Surgical History:  Procedure Laterality Date  . CATARACT EXTRACTION W/ INTRAOCULAR LENS  IMPLANT, BILATERAL    . CERVICAL FUSION  02/10/2011  . history of abd ultrasound  11/01   fatty liver  . MULTIPLE TOOTH EXTRACTIONS    . ORIF FIBULA FRACTURE Left 01/06/2017   Procedure: OPEN REDUCTION INTERNAL FIXATION (ORIF) FIBULA FRACTURE DISTAL FIBULA;  Surgeon: Melrose Nakayama, MD;  Location: [place];  Service: Orthopedics;  Laterality: Left;  Patient states has problems if he will have a tube in  throat for Genera; Anesthesia    Allergies  Allergies  Allergen Reactions  . Morphine And Related Shortness Of Breath  . Percocet [Oxycodone-Acetaminophen] Shortness Of Breath  . Valium Shortness Of Breath    History of Present Illness    83 y/o ? with a h/o HTN, HL, diastolic dysfxn, and PAF that occurred following neck surgery in 2947 that was complicated by aspiration, feeding tube, etc.  Echo @ that time showed nl EF with grade 1 diastolic dysfxn.  He notes that he was on coumadin for a period of time but this was subsequently discontinued.  He has been followed in cardiology clinic over the years and has generally done quite well.  He is s/p ORIF of the left fibula in January.  He has been wearing a left lower ext brace since then.  He has had persistent left lower ext edema since surgery.  Unfortunately, over the past 1-2 months, he has noted progressive dyspnea on exertion, lower ext edema, early satiety, and wt gain.  He denies chest pain, pnd, orthopnea, n, v, dizziness, syncope.  He was recently treated for PNA however has continued to have worsening dyspnea.  His wt is up from 167 lbs on 1/26 to 183 lbs on 4/16.  He was seen @ [facility] on 3/27 with these complaints.  As he had new RLE swelling (left swollen since surgery), a RLE u/s was performed and was negative for DVT.  Per clinic note, ECG showed sinus rhythm.  He continued to feel poorly and was seen in f/u on 4/16.  With wt gain, dyspnea, and edema, he was placed on lasix.  ECG apparently showed sinus with pvc's, though assessment and plan indicates that it might be afib, and looked similar to 3/27 ECG (I reviewed 4/16 ECG today and it shows afib).  An appt was made to see me today.  Since starting lasix, his wt is down 3 lbs.  He feels minimally better.  He had diarrhea on Monday and Tuesday, but this seems to have resolved today.  He remains volume overloaded.  He is in rate-controlled afib.  Home Medications    Prior  to Admission medications   Medication Sig Start Date End Date Taking? Authorizing Provider  aspirin 81 MG tablet Take 1 tablet (81 mg total) by mouth daily. 01/19/15  Yes [name] L Reed, DO  Cholecalciferol (VITAMIN D) 1000 UNITS capsule Take one tablet once daily 08/09/13  Yes [name] L Reed, DO  furosemide (LASIX) 40 MG tablet Take 1 tablet (40 mg total) by mouth daily. 03/27/17  Yes [name] L Reed, DO  gabapentin (NEURONTIN) 600 MG tablet Take 1 tablet (600 mg total) by mouth 3 (three) times daily. For nerve pain 08/16/16  Yes [name] L Reed, DO  HYDROcodone-acetaminophen (NORCO) 5-325 MG tablet Take 1-2 tablets by mouth every 4 (four) hours as needed for severe pain. 01/06/17  Yes Loni Dolly, PA-C  metoprolol (LOPRESSOR) 100 MG tablet Take one tablet by mouth twice daily for heart 11/14/16  Yes [name] L Reed, DO  moxifloxacin (AVELOX) 400 MG tablet Take 1 tablet (400 mg total) by mouth daily at 8 pm. 03/14/17  Yes Lauree Chandler, NP  potassium chloride SA (K-DUR,KLOR-CON) 20 MEQ tablet Take 1 tablet (20 mEq total) by mouth daily. 03/27/17  Yes [name] L Reed, DO  pravastatin (PRAVACHOL) 40 MG tablet Take 1 tablet (40 mg total) by mouth daily. 11/14/16  Yes [name] L Reed, DO  traMADol (ULTRAM) 50 MG tablet Take 1 tablet (50 mg total) by mouth every 6 (six) hours as needed for moderate pain or severe pain. For Back Pain 01/06/17  Yes Loni Dolly, PA-C    Review of Systems    DOE, lower ext edema, wt gain, early satiety as outlined above.  He denies c/p, palps, pnd, orthopnea, n, v, dizziness, syncope.  All other systems reviewed and are otherwise negative except as noted above.  Physical Exam    VS:  BP (!) 110/52 (BP Location: Left Arm, Patient Position: Sitting, Cuff Size: Normal)   Pulse 63   Ht 5\' 7"  (1.702 m)   Wt 180 lb (81.6 kg)   SpO2 96%   BMI 28.19 kg/m  , BMI Body mass index is 28.19 kg/m. GEN: Well nourished, well developed, in no acute distress.  HEENT: normal.  Neck: Supple,  JVP ~ 14 cm, no carotid bruits, or masses. Cardiac: IR, IR, distant, no murmurs, rubs, or gallops. No clubbing, cyanosis, 2-3+ bilat LE edema to lower thighs.  Radials/DP/PT 1+ and equal bilaterally.  Respiratory:  Respirations regular and unlabored, bibasilar crackles. GI: Soft, nontender, nondistended, BS + x 4. MS: no deformity or atrophy. Skin: warm and dry, no rash. Neuro:  Strength and sensation are intact. Psych: Normal affect.  Accessory Clinical  Findings    ECG - afib, 63, LAD, PVC.  Lab Results  Component Value Date   WBC 6.9 03/27/2017   HGB 14.2 03/27/2017   HCT 42.8 03/27/2017   MCV 97.3 03/27/2017   PLT 207 03/27/2017   Lab Results  Component Value Date   CREATININE 1.00 03/27/2017   BUN 24 03/27/2017   NA 145 03/27/2017   K 4.9 03/27/2017   CL 109 03/27/2017   CO2 21 03/27/2017     Assessment & Plan    1.  Acute on chronic diastolic CHF:  Pt presents with a 1-2 month h/o progressive DOE, lower ext edema, early satiety, and 16 lbs wt gain.  He is also in rate-controlled afib.  He was seen on 4/16 and initiated on lasix. Wt is down 3 lbs since then, though he remains markedly volume overloaded.  I suspect loss of atrial kick is playing a role.  I have reviewed labs drawn 4/16.  I have asked him to increase lasix to 40 BID for next five days and then drop back down to 40 daily.  Cont current dose of KCl.  F/u echo to re-eval EF.  Plan for rhythm mgmt as outlined below.  2.  PAF:  Recurrent Afib - presumably the first time since 2012.  He does not feel palpitations, thus duration is unknown.  ECG from PCP on 4/16 reviewed and shows Afib.  3/27 ECG not available.  As above, loss of atrial kick likely contributing to CHF, thus restoration of sinus rhythm will be important.  He is well-rate controlled on oral  blocker therapy.  CHA2DS2VASc = 4.  Renal fxn and wt appropriate for eliquis 5 bid.  D/c ASA.  Provided that we can improve his resp status/CHF Ss, I would plan on  3 wks of therapeutic anticoagulation followed by DCCV.  I will plan to see him back next week to re-evaluate his CHF status.  If he is not improving despite escalation of lasix dosing, he will likely require admission for IV diuresis followed by TEE/DCCV.  F/u TSH today with plan to repeat echo as above.  3.  Essential HTN:  BP stable on  blocker and lasix.  4.  Diarrhea:  This started on Monday but appears to have stopped.  I rec that he Lunz use prn imodium.  5. Dispo:  f/u labs and echo.  f/u in clinic next week.   Murray Hodgkins, NP 03/29/2017, 3:50 PM

## 2017-03-29 NOTE — Telephone Encounter (Signed)
Pt's daughter, Jenny Reichmann, asks if PT Gravatt be stopped until heart issues have resolved. Per Ignacia Bayley, NP, he is agreeable. Notified Cindy.

## 2017-03-29 NOTE — Patient Instructions (Signed)
Medication Instructions:  Your physician has recommended you make the following change in your medication:  START taking Eliquis 5mg  twice daily INCREASE lasix to 40mg  twice daily for 5 days then RESUME lasix 40mg  once daily   Labwork: BMET, CBC, TSH today  Testing/Procedures: Your physician has requested that you have an echocardiogram. Echocardiography is a painless test that uses sound waves to create images of your heart. It provides your doctor with information about the size and shape of your heart and how well your heart's chambers and valves are working. This procedure takes approximately one hour. There are no restrictions for this procedure.    Follow-Up: Your physician recommends that you schedule a follow-up appointment in: 1 week with Dr. Rockey Situ.    Any Other Special Instructions Will Be Listed Below (If Applicable).     If you need a refill on your cardiac medications before your next appointment, please call your pharmacy.

## 2017-03-30 ENCOUNTER — Ambulatory Visit: Payer: Self-pay | Admitting: Internal Medicine

## 2017-03-30 LAB — BASIC METABOLIC PANEL
BUN/Creatinine Ratio: 26 — ABNORMAL HIGH (ref 10–24)
BUN: 27 mg/dL (ref 8–27)
CALCIUM: 9.5 mg/dL (ref 8.6–10.2)
CO2: 21 mmol/L (ref 18–29)
Chloride: 104 mmol/L (ref 96–106)
Creatinine, Ser: 1.02 mg/dL (ref 0.76–1.27)
GFR calc Af Amer: 79 mL/min/{1.73_m2} (ref >59)
GFR, EST NON AFRICAN AMERICAN: 68 mL/min/{1.73_m2} (ref >59)
Glucose: 80 mg/dL (ref 65–99)
POTASSIUM: 4.6 mmol/L (ref 3.5–5.2)
SODIUM: 140 mmol/L (ref 134–144)

## 2017-03-30 LAB — CBC
HEMATOCRIT: 43.4 % (ref 37.5–51.0)
Hemoglobin: 14.5 g/dL (ref 13.0–17.7)
MCH: 32.3 pg (ref 26.6–33.0)
MCHC: 33.4 g/dL (ref 31.5–35.7)
MCV: 97 fL (ref 79–97)
PLATELETS: 210 10*3/uL (ref 150–379)
RBC: 4.49 x10E6/uL (ref 4.14–5.80)
RDW: 13.7 % (ref 12.3–15.4)
WBC: 6.5 10*3/uL (ref 3.4–10.8)

## 2017-03-30 LAB — TSH: TSH: 3.34 u[IU]/mL (ref 0.450–4.500)

## 2017-03-31 ENCOUNTER — Telehealth: Payer: Self-pay | Admitting: Cardiovascular Disease

## 2017-03-31 NOTE — Telephone Encounter (Signed)
Spoke w/ Jenny Reichmann.  She states that she took intermittent leave to be out of work until [place] 1 to care for her father. She needs a letter stating that she should be out of work to take pt to his appts on 4/26 & 4/30 for 1/2 days. She states that her work told her that she needs a letter from attending MD on our letterhead that she will need to take time off for her father's appts. Ortho would not do this for her, is deferring to Dr. Rockey Situ.  Pt has appts on 4/24 for ECHO at 4:00, 4/26 @ 3:40 w/ Dr. Rockey Situ and 4/30 @ 1:00 w/ Dr. Mariea Clonts. She asks for a call back if/when he is able to do this.

## 2017-03-31 NOTE — Telephone Encounter (Signed)
Patient daughter calling for letter from Dr Rockey Situ stating that she should be off work to transport patient to appointments.  Please call to discuss and or fax letter to [phone]

## 2017-04-03 NOTE — Telephone Encounter (Signed)
We can typically provide letters when she comes into the office for these appointments These can be put on our letterhead

## 2017-04-04 ENCOUNTER — Telehealth: Payer: Self-pay | Admitting: Cardiovascular Disease

## 2017-04-04 ENCOUNTER — Other Ambulatory Visit: Payer: Self-pay

## 2017-04-04 ENCOUNTER — Ambulatory Visit (INDEPENDENT_AMBULATORY_CARE_PROVIDER_SITE_OTHER): Payer: PPO

## 2017-04-04 DIAGNOSIS — I5033 Acute on chronic diastolic (congestive) heart failure: Secondary | ICD-10-CM

## 2017-04-04 NOTE — Telephone Encounter (Signed)
Returned call to [place] with [facility]. Let her know patient is scheduled for an echo today and f/u appt with Dr Rockey Situ on 04/06/17. She verbalized understanding and stated this is the update she needed.

## 2017-04-04 NOTE — Telephone Encounter (Signed)
Physical therapist wanted to get clarification from Dr. Rockey Situ on pt having PT. Please call.

## 2017-04-05 NOTE — Progress Notes (Signed)
Patient ID: Eric [name], male   DOB: 1932/11/13, 83 y.o.   MRN: [id number] Cardiology Office Note  Date:  04/06/2017   ID:  Eric [name], DOB 1932-06-13, MRN [id number]  PCP:  Hollace Kinnier, DO   Chief Complaint  Patient presents with  . other     1wk f/u c/o sob and chest heaviness post echo; needs to know when can restart PT.  Reviewed meds with pt verbally.    HPI:  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  He reports having a difficult several months Broken foot in Aug 2017, foot did not heal Had surgery 12/2016 Difficult time recovering  Started having lower extremity swelling over the past month or 2 Has been drinking large amounts of fluid New atrial fib noted on EKG 03/27/17 Rate well controlled  Confirmed on repeat EKG several days later Weight up 20 pounds Was having worsening shortness of breath consistent with acute on chronic diastolic CHF Was seen in our clinic and recommended to increase Lasix up to 40 mg twice a day On higher dose Lasix he has not had much relief, weight is down 2 pounds Still with significant pitting lower extremity edema. High fluid intake  Walks with a walker, was scheduled to do PT for his fracture on the left foot s/p surgery  Sensation of fullness and weight on his chest  EKG personally reviewed by myself on todays visit Shows atrial fibrillation with ventricular rate 74 bpm PVCs left axis deviation  Other past medical history reviewed History ofrestless leg at nighttime. He reports having a  limited range of motion of his left shoulder, unable to raise his left arm above his shoulder height. Denies any trauma to the left arm  Previous lab work reviewed with him showing hemoglobin A1c 5.7, total cholesterol 144  Previous visit for cortisone shot for his back was put on hold secondary to ectopy .  In followup in our clinic, 30 day Holter monitor was ordered.This has shown frequent PVCs, occasional couplets, triplets. Very rare episodes of dizziness.   PMH:   has a past medical history of Anxiety; Arthritis; Chronic diastolic CHF (congestive heart failure) ([facility]); DDD (degenerative disc disease), cervical; Depressive disorder, not elsewhere classified; Depressive disorder, not elsewhere classified; Difficult intubation; DVT (deep venous thrombosis) ([place]); Dysphagia, oral phase; Dyspnea; Edema; History of kidney stones; Hypertension; Hypoxemia; Impacted cerumen; Mixed hyperlipidemia; Nonunion of foot fracture; Other myelopathy; Pain in limb; Palpitations; Paroxysmal Atrial Fibrillation ([place]); Pneumonia (03/06/2003); Spinal stenosis, unspecified region other than cervical; Squamous cell carcinoma of skin of trunk, except scrotum; and Thoracic or lumbosacral neuritis or radiculitis, unspecified.  PSH:    Past Surgical History:  Procedure Laterality Date  . CATARACT EXTRACTION W/ INTRAOCULAR LENS  IMPLANT, BILATERAL    . CERVICAL FUSION  02/10/2011  . history of abd ultrasound  11/01   fatty liver  . MULTIPLE TOOTH EXTRACTIONS    . ORIF FIBULA FRACTURE Left 01/06/2017   Procedure: OPEN REDUCTION INTERNAL FIXATION (ORIF) FIBULA FRACTURE DISTAL FIBULA;  Surgeon: Melrose Nakayama, MD;  Location: [facility];  Service: Orthopedics;  Laterality: Left;  Patient states has problems if he will have a tube in throat for Genera; Anesthesia    Current Outpatient Prescriptions  Medication Sig Dispense Refill  . apixaban (ELIQUIS) 5 MG TABS tablet Take 1 tablet (5 mg total) by mouth 2 (two) times daily. 60  tablet 3  . Cholecalciferol (VITAMIN D) 1000 UNITS capsule Take one tablet once daily 90 capsule 3  . furosemide (LASIX) 40 MG tablet Take 1 tablet (40 mg total) by mouth daily. 30 tablet 3  . gabapentin (NEURONTIN) 600 MG tablet Take 1 tablet (600 mg total) by mouth 3 (three) times daily. For nerve pain 270 tablet 3  . HYDROcodone-acetaminophen (NORCO) 5-325 MG tablet Take 1-2 tablets by mouth every 4 (four) hours as needed for severe pain. 20 tablet 0  . metoprolol (LOPRESSOR) 100 MG tablet Take one tablet by mouth twice daily for heart 180 tablet 3  . moxifloxacin (AVELOX) 400 MG tablet Take 1 tablet (400 mg total) by mouth daily at 8 pm. 7 tablet 0  . potassium chloride SA (K-DUR,KLOR-CON) 20 MEQ tablet Take 1 tablet (20 mEq total) by mouth daily. 30 tablet 0  . pravastatin (PRAVACHOL) 40 MG tablet Take 1 tablet (40 mg total) by mouth daily. 90 tablet 3  . traMADol (ULTRAM) 50 MG tablet Take 1 tablet (50 mg total) by mouth every 6 (six) hours as needed for moderate pain or severe pain. For Back Pain 30 tablet 0   No current facility-administered medications for this visit.      Allergies:   Morphine and related; Percocet [oxycodone-acetaminophen]; and Valium   Social History:  The patient  reports that he has never smoked. He has never used smokeless tobacco. He reports that he does not drink alcohol or use drugs.   Family History:   family history includes Atrial fibrillation in his brother; Cancer in his other; Heart disease in his brother; Stroke in his father and paternal grandfather.    Review of Systems: Review of Systems  Constitutional: Negative.   Respiratory: Negative.   Cardiovascular: Negative.   Gastrointestinal: Negative.   Musculoskeletal: Negative.        Gait instability, decreased range of motion left arm  Neurological: Negative.   Psychiatric/Behavioral: Negative.   All other systems reviewed and are negative.    PHYSICAL EXAM: VS:  BP 118/62 (BP Location:  Left Arm, Patient Position: Sitting, Cuff Size: Normal)   Pulse 74   Ht 5\' 7"  (1.702 m)   Wt 178 lb 12 oz (81.1 kg)   BMI 28.00 kg/m  , BMI Body mass index is 28 kg/m. GEN: Well nourished, well developed, in no acute distress ,  presents in a walker HEENT: normal  Neck: no JVD, carotid bruits, or masses Cardiac:  Irregularly irregular, no murmurs, rubs, or gallops, 1+ pitting edema  bilateral lower extremities Respiratory:  Dullness at the bases left greater than right, normal work of breathing GI: soft, nontender, nondistended, + BS MS: no deformity or atrophy  Skin: warm and dry, no rash Neuro:  Strength and sensation are intact Psych: euthymic mood, full affect    Recent Labs: 05/16/2016: ALT 36 03/27/2017: Hemoglobin 14.2 03/29/2017: BUN 27; Creatinine, Ser 1.02; Platelets 210; Potassium 4.6; Sodium 140; TSH 3.340    Lipid Panel Lab Results  Component Value Date   CHOL 169 05/16/2016   HDL 37 (L) 05/16/2016   LDLCALC 90 05/16/2016   TRIG 210 (H) 05/16/2016      Wt Readings from Last 3 Encounters:  04/06/17 178 lb 12 oz (81.1 kg)  03/29/17 180 lb (81.6 kg)  03/27/17 183 lb (83 kg)       ASSESSMENT AND PLAN:  PAF (paroxysmal atrial fibrillation) (HCC) - Plan: EKG 12-Lead Rate well controlled, tolerating anticoagulation   Chronic diastolic CHF (congestive heart failure) ([facility]) - Plan: EKG 12-Lead Weight up at least 15 pounds since his last visit with me in 2017   pitting edema, shortness of breath, tightness, abdominal swelling Minimal improvement on Lasix 40 twice a day likely secondary to high fluid intake. Recommended that he decrease his fluid intake, salt intake, increase Lasix up to 60 mg twice a day. Weight daily, wear compression hose, leg elevation. If no improvement in his weight and chest fullness in the next week, we would likely change to torsemide 40 twice a day   Essential hypertension - Plan: EKG 12-Lead Blood pressure is well controlled on today's  visit. No changes made to the medications.  Hyperlipidemia Continue pravastatin  Lower extremity edema Secondary to fluid retention, brought on by atrial fibrillation Also with abdominal distention, pleural effusions Aggressive diuresis as above  Disposition:   F/U  1 month  Long discussion with patient and family in the room, all questions answered  Total encounter time more than 45 minutes  Greater than 50% was spent in counseling and coordination of care with the patient    Orders Placed This Encounter  Procedures  . EKG 12-Lead     Signed, Esmond Plants, M.D., Ph.D. 04/06/2017  [place], [place]

## 2017-04-06 ENCOUNTER — Ambulatory Visit (INDEPENDENT_AMBULATORY_CARE_PROVIDER_SITE_OTHER): Payer: PPO | Admitting: Cardiovascular Disease

## 2017-04-06 ENCOUNTER — Encounter: Payer: Self-pay | Admitting: Cardiovascular Disease

## 2017-04-06 VITALS — BP 118/62 | HR 74 | Ht 67.0 in | Wt 178.8 lb

## 2017-04-06 DIAGNOSIS — I480 Paroxysmal atrial fibrillation: Secondary | ICD-10-CM

## 2017-04-06 DIAGNOSIS — I5032 Chronic diastolic (congestive) heart failure: Secondary | ICD-10-CM | POA: Diagnosis not present

## 2017-04-06 DIAGNOSIS — R600 Localized edema: Secondary | ICD-10-CM

## 2017-04-06 DIAGNOSIS — E782 Mixed hyperlipidemia: Secondary | ICD-10-CM | POA: Diagnosis not present

## 2017-04-06 DIAGNOSIS — I10 Essential (primary) hypertension: Secondary | ICD-10-CM | POA: Diagnosis not present

## 2017-04-06 MED ORDER — FUROSEMIDE 40 MG PO TABS: 40.0000 mg | ORAL_TABLET | Freq: Two times a day (BID) | ORAL | 3 refills | Status: DC

## 2017-04-06 NOTE — Patient Instructions (Addendum)
Medication Instructions:   Please increase the lasix/furosemide up to 60 mg twice a day 3 pills twice a day  Take with 1 1/2 potassium pills  Decrease your fluid intake   Labwork:  No new labs needed  Testing/Procedures:  No further testing at this time   I recommend watching educational videos on topics of interest to you at:       [email or web address]  Enter code: [email or web address]    Follow-Up: It was a pleasure seeing you in the office today. Please call us if you have new issues that need to be addressed before your next appt.  [phone]  Your physician wants you to follow-up in: 1 month.    If you need a refill on your cardiac medications before your next appointment, please call your pharmacy.

## 2017-04-07 ENCOUNTER — Telehealth: Payer: Self-pay | Admitting: *Deleted

## 2017-04-07 NOTE — Telephone Encounter (Signed)
Eric [name], daughter called and wanted to know if she can have a work note to give to [facility] regarding patient's appointments. She has to take patient to the Dr. And needs Dr. Radene Ou for 4/26, 4/30, and 6/8. Please Advise.

## 2017-04-07 NOTE — Telephone Encounter (Signed)
Ok to compose this note and stamp it with my signature indicating Jahkeem had appts those dates.

## 2017-04-07 NOTE — Telephone Encounter (Signed)
Work note printed and given to daughter.

## 2017-04-10 ENCOUNTER — Encounter: Payer: Self-pay | Admitting: Internal Medicine

## 2017-04-10 ENCOUNTER — Ambulatory Visit (INDEPENDENT_AMBULATORY_CARE_PROVIDER_SITE_OTHER): Payer: PPO | Admitting: Internal Medicine

## 2017-04-10 ENCOUNTER — Telehealth: Payer: Self-pay | Admitting: Cardiovascular Disease

## 2017-04-10 VITALS — BP 120/80 | HR 53 | Temp 97.4°F | Wt 170.0 lb

## 2017-04-10 DIAGNOSIS — Z9889 Other specified postprocedural states: Secondary | ICD-10-CM

## 2017-04-10 DIAGNOSIS — I480 Paroxysmal atrial fibrillation: Secondary | ICD-10-CM

## 2017-04-10 DIAGNOSIS — I5033 Acute on chronic diastolic (congestive) heart failure: Secondary | ICD-10-CM | POA: Diagnosis not present

## 2017-04-10 DIAGNOSIS — Z8781 Personal history of (healed) traumatic fracture: Secondary | ICD-10-CM

## 2017-04-10 DIAGNOSIS — Z967 Presence of other bone and tendon implants: Secondary | ICD-10-CM

## 2017-04-10 LAB — BASIC METABOLIC PANEL
BUN: 40 mg/dL — ABNORMAL HIGH (ref 7–25)
CO2: 23 mmol/L (ref 20–31)
Calcium: 10.1 mg/dL (ref 8.6–10.3)
Chloride: 105 mmol/L (ref 98–110)
Creat: 1.34 mg/dL — ABNORMAL HIGH (ref 0.70–1.11)
Glucose, Bld: 89 mg/dL (ref 65–99)
Potassium: 4.6 mmol/L (ref 3.5–5.3)
Sodium: 146 mmol/L (ref 135–146)

## 2017-04-10 NOTE — Telephone Encounter (Signed)
Prescription is for Lasix 60 twice a day Decreased fluid intake

## 2017-04-10 NOTE — Telephone Encounter (Signed)
Spoke with daughter again. She did pick up Rx for furosemide on 03/27/17 from PCP and therefore insurance will not fill a new Rx at this time. Patient 's weight has decreased to 170lb today at PCP and chest fullness has subsided.  Daughter said she is in [place] right now so she would like a new Rx sent to the [facility] at [facility] and [address] even if she has to get it tomorrow because she will be there working. She is the caregiver for the patient and the patient cannot drive to get the Rx himself.   Routing to Dr Rockey Situ and Dede Query, RN.

## 2017-04-10 NOTE — Telephone Encounter (Signed)
Pt daughter is calling back, states pt pharmacy states all they need the new instructions for this medication.

## 2017-04-10 NOTE — Telephone Encounter (Signed)
Pt daughter states [facility] will not fill his Lasix until [place] [facility]. He only has 6 pills left. Please call.

## 2017-04-10 NOTE — Progress Notes (Signed)
Location:  [facility] clinic Provider:  Andry Bogden L. Mariea Clonts, D.O., C.M.D.  Goals of Care:  Advanced Directives 03/27/2017  Does Patient Have a Medical Advance Directive? No  Type of Advance Directive -  Does patient want to make changes to medical advance directive? -  Copy of [place] in Chart? -  Would patient like information on creating a medical advance directive? No - Patient declined   Chief Complaint  Patient presents with  . Medical Management of Chronic Issues    2 week follow-up for CHF    HPI: Patient is a 83 y.o. male seen today for medical management of chronic diseases.    He saw cardiology, Dr. Rockey Situ on 04/06/17, and his lasix was increased to 60mg  po bid, but had 40mg  pills.  Potassium was increased to 1.5 tablets (22meq) daily. Weight down to 170 lbs from 183 lbs when I saw him last.  He has also been limiting his fluid intake.  Follows up there in 4 weeks.    Past Medical History:  Diagnosis Date  . Anxiety   . Arthritis   . Chronic diastolic CHF (congestive heart failure) ([place])    a. 01/2011 Echo: EF 50-55%, gr1 DD, mild AI, nl RV fxn, mild TR/PR.  . DDD (degenerative disc disease), cervical   . Depressive disorder, not elsewhere classified   . Depressive disorder, not elsewhere classified   . Difficult intubation   . DVT (deep venous thrombosis) ([place])   . Dysphagia, oral phase   . Dyspnea   . Edema   . History of kidney stones   . Hypertension   . Hypoxemia   . Impacted cerumen   . Mixed hyperlipidemia   . 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 Other myelopathy   . Pain in limb   . Palpitations   . Paroxysmal Atrial Fibrillation ([place])    a. a. 01/2011 in setting of post-op complications including aspiration pna;  b. CHA2DS2VASc = 4.  . Pneumonia 03/06/2003  . Spinal stenosis, unspecified region other than cervical   . 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 Thoracic  or lumbosacral neuritis or radiculitis, unspecified     Past Surgical History:  Procedure Laterality Date  . CATARACT EXTRACTION W/ INTRAOCULAR LENS  IMPLANT, BILATERAL    . CERVICAL FUSION  02/10/2011  . history of abd ultrasound  11/01   fatty liver  . MULTIPLE TOOTH EXTRACTIONS    . ORIF FIBULA FRACTURE Left 01/06/2017   Procedure: OPEN REDUCTION INTERNAL FIXATION (ORIF) FIBULA FRACTURE DISTAL FIBULA;  Surgeon: Melrose Nakayama, MD;  Location: [facility];  Service: Orthopedics;  Laterality: Left;  Patient states has problems if he will have a tube in throat for Genera; Anesthesia    Allergies  Allergen Reactions  . Morphine And Related Shortness Of Breath  . Percocet [Oxycodone-Acetaminophen] Shortness Of Breath  . Valium Shortness Of Breath    Allergies as of 04/10/2017      Reactions   Morphine And Related Shortness Of Breath   Percocet [oxycodone-acetaminophen] Shortness Of Breath   Valium Shortness Of Breath      Medication List       Accurate as of 04/10/17 11:13 AM. Always use your most recent med list.          apixaban 5 MG Tabs tablet Commonly known as:  ELIQUIS Take 1 tablet (5 mg total) by  mouth 2 (two) times daily.   furosemide 40 MG tablet Commonly known as:  LASIX Take 1 tablet (40 mg total) by mouth 2 (two) times daily.   gabapentin 600 MG tablet Commonly known as:  NEURONTIN Take 1 tablet (600 mg total) by mouth 3 (three) times daily. For nerve pain   HYDROcodone-acetaminophen 5-325 MG tablet Commonly known as:  NORCO Take 1-2 tablets by mouth every 4 (four) hours as needed for severe pain.   metoprolol 100 MG tablet Commonly known as:  LOPRESSOR Take one tablet by mouth twice daily for heart   moxifloxacin 400 MG tablet Commonly known as:  AVELOX Take 1 tablet (400 mg total) by mouth daily at 8 pm.   potassium chloride SA 20 MEQ tablet Commonly known as:  K-DUR,KLOR-CON Take 1 tablet (20 mEq total) by mouth daily.   pravastatin 40 MG  tablet Commonly known as:  PRAVACHOL Take 1 tablet (40 mg total) by mouth daily.   traMADol 50 MG tablet Commonly known as:  ULTRAM Take 1 tablet (50 mg total) by mouth every 6 (six) hours as needed for moderate pain or severe pain. For Back Pain   Vitamin D 1000 units capsule Take one tablet once daily       Review of Systems:  Review of Systems  Constitutional: Positive for malaise/fatigue. Negative for chills and fever.  HENT: Positive for hearing loss. Negative for congestion.   Eyes: Negative for blurred vision.       Glasses  Respiratory: Negative for cough, sputum production and shortness of breath.   Cardiovascular: Positive for chest pain.       Pressure like his cat, Ardyth Gal, is sitting on his chest on two occasions since last appt  Gastrointestinal: Negative for abdominal pain, blood in stool, constipation, diarrhea and melena.  Genitourinary: Positive for frequency. Negative for dysuria.  Musculoskeletal: Negative for falls.       Left lower leg healing slowly, walking with walker  Skin:       Dry skin  Neurological: Negative for dizziness, loss of consciousness and weakness.  Endo/Heme/Allergies: Does not bruise/bleed easily.  Psychiatric/Behavioral: Positive for depression. Negative for memory loss.    Health Maintenance  Topic Date Due  . COLONOSCOPY  08/30/2015  . INFLUENZA VACCINE  07/12/2017  . TETANUS/TDAP  10/15/2023  . PNA vac Low Risk Adult  Completed    Physical Exam: Vitals:   04/10/17 1104  BP: 120/80  Pulse: (!) 53  Temp: 97.4 F (36.3 C)  TempSrc: Oral  SpO2: 94%  Weight: 170 lb (77.1 kg)   Body mass index is 26.63 kg/m. Physical Exam  Constitutional: He is oriented to person, place, and time. He appears well-developed and well-nourished. No distress.  Cardiovascular: Exam reveals no gallop and no friction rub.   No murmur heard. irreg irreg; pitting edema resolved, able to close velcro on shoes now  Pulmonary/Chest: Effort normal  and breath sounds normal. No respiratory distress. He has no wheezes. He has no rales.  Abdominal: Soft. Bowel sounds are normal. He exhibits no distension.  Musculoskeletal:  Walking with rolling walker, able to get up out of chair much better than last time  Neurological: He is alert and oriented to person, place, and time.  Skin: Skin is warm and dry.  Psychiatric: He has a normal mood and affect.    Labs reviewed: Basic Metabolic Panel:  Recent Labs  01/06/17 1026 03/27/17 1530 03/29/17 1503  NA 140 145 140  K 4.5 4.9  4.6  CL 105 109 104  CO2 26 21 21   GLUCOSE 86 94 80  BUN 15 24 27   CREATININE 0.99 1.00 1.02  CALCIUM 9.3 9.2 9.5  TSH  --   --  3.340   Liver Function Tests:  Recent Labs  05/16/16 1035  AST 26  ALT 36  ALKPHOS 45  BILITOT 0.8  PROT 6.5  ALBUMIN 4.0   No results for input(s): LIPASE, AMYLASE in the last 8760 hours. No results for input(s): AMMONIA in the last 8760 hours. CBC:  Recent Labs  05/16/16 1035 01/06/17 1026 03/27/17 1530 03/29/17 1503  WBC 6.4 6.7 6.9 6.5  NEUTROABS 3.5  --  3,588  --   HGB  --  15.2 14.2  --   HCT 43.2 45.2 42.8 43.4  MCV 95 94.8 97.3 97  PLT 208 182 207 210   Lipid Panel:  Recent Labs  05/16/16 1035  CHOL 169  HDL 37*  LDLCALC 90  TRIG 210*  CHOLHDL 4.6   Lab Results  Component Value Date   HGBA1C 5.7 (H) 05/16/2016    Procedures since last visit: Dg Chest 2 View  Result Date: 03/14/2017 CLINICAL DATA:  Worsening shortness of breath and cough over the last 3 weeks. EXAM: CHEST  2 VIEW COMPARISON:  02/16/2011 FINDINGS: Mild cardiomegaly. Unfolded aorta. Patchy infiltrate in both lower lobes posteriorly consistent with mild lower lobe pneumonia. Upper lungs are clear. No effusions. No acute bone finding. IMPRESSION: Patchy posterior lower lobe infiltrates bilaterally consistent with pneumonia. Electronically Signed   By: Nelson Chimes M.D.   On: 03/14/2017 10:25    Assessment/Plan 1. Acute on  chronic diastolic CHF (congestive heart failure), NYHA class 2 (HCC) - weight back to baseline and now appears dry, but appears he was taking 120mg  po bid of lasix rather than the 60mg  po bid intended by cardiology due to some confusion about Rxs at different pharmacies -appears quite dry with decreased cap refill and tenting of skin now -lungs clear and abd soft, edema resolved - Basic metabolic panel  -decrease lasix back to 40mg  po bid and kcl 39meq daily until seen next week here by NP Eubanks  2. Paroxysmal atrial fibrillation (HCC) -persists, cont eliquis, rate is controlled - Basic metabolic panel  3. S/P ORIF (open reduction internal fixation) fracture -of left ankle -has brace on from ortho -cont use of walker and keep f/u with ortho  Labs/tests ordered:   Orders Placed This Encounter  Procedures  . Basic metabolic panel    Order Specific Question:   Has the patient fasted?    Answer:   Yes    Next appt:  1 week with NP Eubanks f/u on chf, keep cardiology appt in 4 wks  Buena Boehm L. Ishan Sanroman, D.O. [facility] [address], [place] [address] Cell Phone (Mon-Fri 8am-5pm):  [phone] On Call:  [phone] & follow prompts after 5pm & weekends Office Phone:  [phone] Office Fax:  [phone]

## 2017-04-10 NOTE — Patient Instructions (Signed)
Go back to lasix 40mg  one tablet twice a day. Also take potassium 61meq one tablet daily. We will call when we get your labs.

## 2017-04-10 NOTE — Telephone Encounter (Signed)
Spoke with daugher, ok per DPR. Stated patient cannot get furosemide until 5/9 and will run out by tomorrow. Patient is taking furosemide 120 mg (3 tablet) twice a day. According to Dr Donivan Scull AVS patient should be taking furosemide up to 60 mg twice a day. However, daughter said patient is taking 120 mg twice a day. Advised daughter that patient should be taking 60 mg twice a day. She insists Dr Rockey Situ prescribed 120 mg twice day. Weight at PCP today was 170lb improved and chest fullness has improved.  Called patient Eric [name] in [place] and they did say Rx cannot be filled until [place] [facility] because there was another run for furosemide Rx on 03/27/17 at [facility] in [place]. Eric [name] from [place] spoke with [facility] in [place] and he assumes patient already picked up Rx from [facility] and indeed cannot run the Rx until 5/9.

## 2017-04-11 NOTE — Telephone Encounter (Signed)
Spoke w/ Landis Gandy.   Advised him of Dr. Donivan Scull recommendation.  He will fill pt's rx now. Asked him to call back w/ any further questions or concerns.

## 2017-04-14 ENCOUNTER — Other Ambulatory Visit: Payer: Self-pay | Admitting: *Deleted

## 2017-04-14 DIAGNOSIS — I5033 Acute on chronic diastolic (congestive) heart failure: Secondary | ICD-10-CM

## 2017-04-14 MED ORDER — POTASSIUM CHLORIDE CRYS ER 20 MEQ PO TBCR: 20.0000 meq | EXTENDED_RELEASE_TABLET | Freq: Every day | ORAL | 0 refills | Status: DC

## 2017-04-14 MED ORDER — GABAPENTIN 600 MG PO TABS: 600.0000 mg | ORAL_TABLET | Freq: Three times a day (TID) | ORAL | 0 refills | Status: DC

## 2017-04-14 MED ORDER — FUROSEMIDE 40 MG PO TABS: 40.0000 mg | ORAL_TABLET | Freq: Two times a day (BID) | ORAL | 0 refills | Status: DC

## 2017-04-14 NOTE — Telephone Encounter (Signed)
Patient requested Rx to be faxed to [facility].

## 2017-04-17 ENCOUNTER — Encounter: Payer: PPO | Admitting: Nurse Practitioner

## 2017-04-20 ENCOUNTER — Ambulatory Visit (INDEPENDENT_AMBULATORY_CARE_PROVIDER_SITE_OTHER): Payer: PPO | Admitting: Nurse Practitioner

## 2017-04-20 ENCOUNTER — Encounter: Payer: Self-pay | Admitting: Nurse Practitioner

## 2017-04-20 VITALS — BP 118/72 | HR 78 | Temp 97.3°F | Resp 20 | Ht 67.0 in | Wt 175.4 lb

## 2017-04-20 DIAGNOSIS — I5033 Acute on chronic diastolic (congestive) heart failure: Secondary | ICD-10-CM

## 2017-04-20 DIAGNOSIS — M25572 Pain in left ankle and joints of left foot: Secondary | ICD-10-CM | POA: Diagnosis not present

## 2017-04-20 DIAGNOSIS — I480 Paroxysmal atrial fibrillation: Secondary | ICD-10-CM

## 2017-04-20 NOTE — Progress Notes (Signed)
Careteam: Patient Care Team: [name], DO as PCP - General (Geriatric Medicine) [name], MD as Consulting Physician (Cardiology) Normajean Glasgow, MD as Attending Physician (Physical Medicine and Rehabilitation) Phylliss Bob, MD as Consulting Physician (Orthopedic Surgery)  Advanced Directive information Does Patient Have a Medical Advance Directive?: No  Allergies  Allergen Reactions  . Morphine And Related Shortness Of Breath  . Percocet [Oxycodone-Acetaminophen] Shortness Of Breath  . Valium Shortness Of Breath    Chief Complaint  Patient presents with  . Follow-up    Pt is being seen for a 1 week follow up on CHF, weight, and diuretics. Pt states that he feels more like himself.      HPI: Patient is a 83 y.o. male seen in the office today to follow up CHF. pt saw cardiology, Dr. Rockey Situ on 04/06/17, and his lasix was increased to 60mg  po bid, but had 40mg  pills.  Potassium was increased to 1.5 tablets (59meq) daily. Weight went down to 170 lbs from 183 lbs Pt was taking 120 mg BID, Dr Mariea Clonts decreased lasix down to 40 mg but he is back to 60 mg BID per cardiologist for 5 days.  Weight is up 5 lbs today. He is now taking 20 meq potassium daily.  Pt in a fib, being cardioverted in June.  conts to be short of breath but overall this has improved. He is able to get out and walk around the yard.   Review of Systems:  Review of Systems  Constitutional: Positive for activity change and appetite change. Negative for chills, diaphoresis, fatigue, fever and unexpected weight change.  Respiratory: Positive for shortness of breath. Negative for choking, chest tightness, wheezing and stridor.   Cardiovascular: Positive for leg swelling. Negative for chest pain and palpitations.  Skin: Negative for color change, pallor and rash.  Neurological:       Bilateral lower extremity numbness due to radiculopathy    Past Medical History:  Diagnosis Date  . Anxiety   . Arthritis    . Chronic diastolic CHF (congestive heart failure) ([place])    a. 01/2011 Echo: EF 50-55%, gr1 DD, mild AI, nl RV fxn, mild TR/PR.  . DDD (degenerative disc disease), cervical   . Depressive disorder, not elsewhere classified   . Depressive disorder, not elsewhere classified   . Difficult intubation   . DVT (deep venous thrombosis) ([place])   . Dysphagia, oral phase   . Dyspnea   . Edema   . History of kidney stones   . Hypertension   . Hypoxemia   . Impacted cerumen   . Mixed hyperlipidemia   . 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 Other myelopathy   . Pain in limb   . Palpitations   . Paroxysmal Atrial Fibrillation ([place])    a. a. 01/2011 in setting of post-op complications including aspiration pna;  b. CHA2DS2VASc = 4.  . Pneumonia 03/06/2003  . Spinal stenosis, unspecified region other than cervical   . 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 Thoracic or lumbosacral neuritis or radiculitis, unspecified    Past Surgical History:  Procedure Laterality Date  . CATARACT EXTRACTION W/ INTRAOCULAR LENS  IMPLANT, BILATERAL    . CERVICAL FUSION  02/10/2011  . history of abd ultrasound  11/01   fatty liver  . MULTIPLE TOOTH EXTRACTIONS    . ORIF FIBULA FRACTURE Left 01/06/2017  Procedure: OPEN REDUCTION INTERNAL FIXATION (ORIF) FIBULA FRACTURE DISTAL FIBULA;  Surgeon: Melrose Nakayama, MD;  Location: [facility];  Service: Orthopedics;  Laterality: Left;  Patient states has problems if he will have a tube in throat for [place]; Anesthesia   Social History:   reports that he has never smoked. He has never used smokeless tobacco. He reports that he does not drink alcohol or use drugs.  Family History  Problem Relation Age of Onset  . Stroke Father   . Atrial fibrillation Brother        on coumadin  . Heart disease Brother        AFib- coumadin  . Stroke Paternal Grandfather   . Cancer Other        colon cancer at early age     Medications: Patient's Medications  New Prescriptions   No medications on file  Previous Medications   APIXABAN (ELIQUIS) 5 MG TABS TABLET    Take 1 tablet (5 mg total) by mouth 2 (two) times daily.   CHOLECALCIFEROL (VITAMIN D) 1000 UNITS CAPSULE    Take one tablet once daily   FUROSEMIDE (LASIX) 20 MG TABLET    Take 60 mg by mouth 2 (two) times daily.   GABAPENTIN (NEURONTIN) 600 MG TABLET    Take 1 tablet (600 mg total) by mouth 3 (three) times daily. For nerve pain   HYDROCODONE-ACETAMINOPHEN (NORCO) 5-325 MG TABLET    Take 1-2 tablets by mouth every 4 (four) hours as needed for severe pain.   METOPROLOL (LOPRESSOR) 100 MG TABLET    Take one tablet by mouth twice daily for heart   POTASSIUM CHLORIDE SA (K-DUR,KLOR-CON) 20 MEQ TABLET    Take 1 tablet (20 mEq total) by mouth daily.   PRAVASTATIN (PRAVACHOL) 40 MG TABLET    Take 1 tablet (40 mg total) by mouth daily.   TRAMADOL (ULTRAM) 50 MG TABLET    Take 1 tablet (50 mg total) by mouth every 6 (six) hours as needed for moderate pain or severe pain. For Back Pain  Modified Medications   No medications on file  Discontinued Medications   FUROSEMIDE (LASIX) 40 MG TABLET    Take 1 tablet (40 mg total) by mouth 2 (two) times daily.   MOXIFLOXACIN (AVELOX) 400 MG TABLET    Take 1 tablet (400 mg total) by mouth daily at 8 pm.     Physical Exam:  Vitals:   04/20/17 1023  BP: 118/72  Pulse: 78  Resp: 20  Temp: 97.3 F (36.3 C)  TempSrc: Oral  SpO2: 98%  Weight: 175 lb 6.4 oz (79.6 kg)  Height: 5\' 7"  (1.702 m)   Body mass index is 27.47 kg/m.  Physical Exam  Constitutional: He is oriented to person, place, and time. He appears well-developed and well-nourished. No distress.  Cardiovascular: An irregularly irregular rhythm present.  No murmur heard. 1+ edema bilaterally   Pulmonary/Chest: Effort normal and breath sounds normal. No respiratory distress. He has no wheezes. He has no rales.  Abdominal: Soft. Bowel sounds are  normal. He exhibits no distension.  Musculoskeletal: He exhibits edema.  Walking with rolling walker  Neurological: He is alert and oriented to person, place, and time.  Skin: Skin is warm and dry.  Psychiatric: He has a normal mood and affect.    Labs reviewed: Basic Metabolic Panel:  Recent Labs  03/27/17 1530 03/29/17 1503 04/10/17 1145  NA 145 140 146  K 4.9 4.6 4.6  CL 109 104 105  CO2 21 21 23   GLUCOSE 94 80 89  BUN 24 27 40*  CREATININE 1.00 1.02 1.34*  CALCIUM 9.2 9.5 10.1  TSH  --  3.340  --    Liver Function Tests:  Recent Labs  05/16/16 1035  AST 26  ALT 36  ALKPHOS 45  BILITOT 0.8  PROT 6.5  ALBUMIN 4.0   No results for input(s): LIPASE, AMYLASE in the last 8760 hours. No results for input(s): AMMONIA in the last 8760 hours. CBC:  Recent Labs  05/16/16 1035 01/06/17 1026 03/27/17 1530 03/29/17 1503  WBC 6.4 6.7 6.9 6.5  NEUTROABS 3.5  --  3,588  --   HGB  --  15.2 14.2  --   HCT 43.2 45.2 42.8 43.4  MCV 95 94.8 97.3 97  PLT 208 182 207 210   Lipid Panel:  Recent Labs  05/16/16 1035  CHOL 169  HDL 37*  LDLCALC 90  TRIG 210*  CHOLHDL 4.6   TSH:  Recent Labs  03/29/17 1503  TSH 3.340   A1C: Lab Results  Component Value Date   HGBA1C 5.7 (H) 05/16/2016     Assessment/Plan 1. Acute on chronic diastolic CHF (congestive heart failure), NYHA class 2 (HCC) Weight is up from previous visit 10 days ago however 4 days ago cardiologist increased lasix to 60 mg twice daily Pt reported he ate chickfila last night, Education provided and stressed importance of dietary compliance and to restrict sodium. [place] need more of a strict diet due to recent CHF exacerbation.   2. Paroxysmal atrial fibrillation (HCC) irreg rhythm today, cardioversion planned for June, agree that this could be contributing to shortness of breath.   Carlos American. Harle Battiest  [facility] [phone] 8 am - 5  pm) [phone] (after hours)

## 2017-04-20 NOTE — Patient Instructions (Signed)
DASH Eating Plan DASH stands for "Dietary Approaches to Stop Hypertension." The DASH eating plan is a healthy eating plan that has been shown to reduce high blood pressure (hypertension). It [place]er also reduce your risk for type 2 diabetes, heart disease, and stroke. The DASH eating plan [place] also help with weight loss. What are tips for following this plan? General guidelines  Avoid eating more than 2,300 mg (milligrams) of salt (sodium) a day. If you have hypertension, you [place] need to reduce your sodium intake to 1,500 mg a day.  Limit alcohol intake to no more than 1 drink a day for nonpregnant women and 2 drinks a day for men. One drink equals 12 oz of beer, 5 oz of wine, or 1 oz of hard liquor.  Work with your health care provider to maintain a healthy body weight or to lose weight. Ask what an ideal weight is for you.  Get at least 30 minutes of exercise that causes your heart to beat faster (aerobic exercise) most days of the week. Activities [place] include walking, swimming, or biking.  Work with your health care provider or diet and nutrition specialist (dietitian) to adjust your eating plan to your individual calorie needs. Reading food labels  Check food labels for the amount of sodium per serving. Choose foods with less than 5 percent of the Daily Value of sodium. Generally, foods with less than 300 mg of sodium per serving fit into this eating plan.  To find whole grains, look for the word "whole" as the first word in the ingredient list. Shopping  Buy products labeled as "low-sodium" or "no salt added."  Buy fresh foods. Avoid canned foods and premade or frozen meals. Cooking  Avoid adding salt when cooking. Use salt-free seasonings or herbs instead of table salt or sea salt. Check with your health care provider or pharmacist before using salt substitutes.  Do not fry foods. Cook foods using healthy methods such as baking, boiling, grilling, and broiling instead.  Cook with  heart-healthy oils, such as olive, canola, soybean, or sunflower oil. Meal planning   Eat a balanced diet that includes: ? 5 or more servings of fruits and vegetables each day. At each meal, try to fill half of your plate with fruits and vegetables. ? Up to 6-8 servings of whole grains each day. ? Less than 6 oz of lean meat, poultry, or fish each day. A 3-oz serving of meat is about the same size as a deck of cards. One egg equals 1 oz. ? 2 servings of low-fat dairy each day. ? A serving of nuts, seeds, or beans 5 times each week. ? Heart-healthy fats. Healthy fats called Omega-3 fatty acids are found in foods such as flaxseeds and coldwater fish, like sardines, salmon, and mackerel.  Limit how much you eat of the following: ? Canned or prepackaged foods. ? Food that is high in trans fat, such as fried foods. ? Food that is high in saturated fat, such as fatty meat. ? Sweets, desserts, sugary drinks, and other foods with added sugar. ? Full-fat dairy products.  Do not salt foods before eating.  Try to eat at least 2 vegetarian meals each week.  Eat more home-cooked food and less restaurant, buffet, and fast food.  When eating at a restaurant, ask that your food be prepared with less salt or no salt, if possible. What foods are recommended? The items listed Yeske not be a complete list. Talk with your dietitian about what   dietary choices are best for you. Grains Whole-grain or whole-wheat bread. Whole-grain or whole-wheat pasta. Brown rice. Oatmeal. Quinoa. Bulgur. Whole-grain and low-sodium cereals. Pita bread. Low-fat, low-sodium crackers. Whole-wheat flour tortillas. Vegetables Fresh or frozen vegetables (raw, steamed, roasted, or grilled). Low-sodium or reduced-sodium tomato and vegetable juice. Low-sodium or reduced-sodium tomato sauce and tomato paste. Low-sodium or reduced-sodium canned vegetables. Fruits All fresh, dried, or frozen fruit. Canned fruit in natural juice (without  added sugar). Meat and other protein foods Skinless chicken or turkey. Ground chicken or turkey. Pork with fat trimmed off. Fish and seafood. Egg whites. Dried beans, peas, or lentils. Unsalted nuts, nut butters, and seeds. Unsalted canned beans. Lean cuts of beef with fat trimmed off. Low-sodium, lean deli meat. Dairy Low-fat (1%) or fat-free (skim) milk. Fat-free, low-fat, or reduced-fat cheeses. Nonfat, low-sodium ricotta or cottage cheese. Low-fat or nonfat yogurt. Low-fat, low-sodium cheese. Fats and oils Soft margarine without trans fats. Vegetable oil. Low-fat, reduced-fat, or light mayonnaise and salad dressings (reduced-sodium). Canola, safflower, olive, soybean, and sunflower oils. Avocado. Seasoning and other foods Herbs. Spices. Seasoning mixes without salt. Unsalted popcorn and pretzels. Fat-free sweets. What foods are not recommended? The items listed Louis not be a complete list. Talk with your dietitian about what dietary choices are best for you. Grains Baked goods made with fat, such as croissants, muffins, or some breads. Dry pasta or rice meal packs. Vegetables Creamed or fried vegetables. Vegetables in a cheese sauce. Regular canned vegetables (not low-sodium or reduced-sodium). Regular canned tomato sauce and paste (not low-sodium or reduced-sodium). Regular tomato and vegetable juice (not low-sodium or reduced-sodium). Pickles. Olives. Fruits Canned fruit in a light or heavy syrup. Fried fruit. Fruit in cream or butter sauce. Meat and other protein foods Fatty cuts of meat. Ribs. Fried meat. Bacon. Sausage. Bologna and other processed lunch meats. Salami. Fatback. Hotdogs. Bratwurst. Salted nuts and seeds. Canned beans with added salt. Canned or smoked fish. Whole eggs or egg yolks. Chicken or turkey with skin. Dairy Whole or 2% milk, cream, and half-and-half. Whole or full-fat cream cheese. Whole-fat or sweetened yogurt. Full-fat cheese. Nondairy creamers. Whipped toppings.  Processed cheese and cheese spreads. Fats and oils Butter. Stick margarine. Lard. Shortening. Ghee. Bacon fat. Tropical oils, such as coconut, palm kernel, or palm oil. Seasoning and other foods Salted popcorn and pretzels. Onion salt, garlic salt, seasoned salt, table salt, and sea salt. Worcestershire sauce. Tartar sauce. Barbecue sauce. Teriyaki sauce. Soy sauce, including reduced-sodium. Steak sauce. Canned and packaged gravies. Fish sauce. Oyster sauce. Cocktail sauce. Horseradish that you find on the shelf. Ketchup. Mustard. Meat flavorings and tenderizers. Bouillon cubes. Hot sauce and Tabasco sauce. Premade or packaged marinades. Premade or packaged taco seasonings. Relishes. Regular salad dressings. Where to find more information:  National Heart, Lung, and Blood Institute: www.nhlbi.nih.gov  American Heart Association: www.heart.org Summary  The DASH eating plan is a healthy eating plan that has been shown to reduce high blood pressure (hypertension). It Felty also reduce your risk for type 2 diabetes, heart disease, and stroke.  With the DASH eating plan, you should limit salt (sodium) intake to 2,300 mg a day. If you have hypertension, you [place] need to reduce your sodium intake to 1,500 mg a day.  When on the DASH eating plan, aim to eat more fresh fruits and vegetables, whole grains, lean proteins, low-fat dairy, and heart-healthy fats.  Work with your health care provider or diet and nutrition specialist (dietitian) to adjust your eating plan to your individual   calorie needs. This information is not intended to replace advice given to you by your health care provider. Make sure you discuss any questions you have with your health care provider. Document Released: 11/17/2011 Document Revised: 11/21/2016 Document Reviewed: 11/21/2016 Elsevier Interactive Patient Education  2017 Elsevier Inc.  

## 2017-04-21 ENCOUNTER — Telehealth: Payer: Self-pay | Admitting: Cardiovascular Disease

## 2017-04-21 NOTE — Telephone Encounter (Signed)
Pt daughter calling stating therapist has released patient to going back to doing normal activities  She is not sure if he is able to do it, she states they told him to start of slowly and work his way up. That he was even okay to mow the lawn She thinks he needs to wait longer Please call back

## 2017-04-21 NOTE — Telephone Encounter (Signed)
Spoke w/ pt's daughter.  She reports that pt's orthopedic MD operated on his left foot and has cleared him to resume his normal activities, such as getting out in the yard, walking to the mailbox and pushing his self-propelled lawnmower as long as he wears his air boot.  She does not think that pt is ok to do this, as he becomes winded so fast due to this afib. She would like to see about proceeding w/ setting up DCCV, preferably on a Friday, as she is off work. She reports that pt started Eliquis after 1st ov w/ Ignacia Bayley, NP on 03/29/17 and has not missed a dose. He only has a few pills left and will run out soon.  Advised her that I will make Dr. Rockey Situ aware of her concerns and call her back w/ his recommendation.

## 2017-04-23 NOTE — Telephone Encounter (Signed)
He will need to refill prescription of eliquis as even if we restore normal sinus rhythm he needs to stay on this twice a day (likely indefinitely)  His weight was still up markedly on his last clinic visit with pitting edema He needs to decrease his fluid intake, take his diuretic, get his weight down Without getting the fluid out first he will not maintain normal sinus rhythm after cardioversion He has scheduled follow-up to examine his leg edema and weight

## 2017-04-24 MED ORDER — APIXABAN 5 MG PO TABS: 5.0000 mg | ORAL_TABLET | Freq: Two times a day (BID) | ORAL | 6 refills | Status: DC

## 2017-04-24 NOTE — Telephone Encounter (Signed)
Left message for [facility] w/ Dr. Donivan Scull recommendations.  Asked her to call back w/ any questions or concerns.  Refill sent in for pt's Eliquis.

## 2017-04-24 NOTE — Telephone Encounter (Signed)
Okay to work outside but would avoid the hot weather Okay to use [place]

## 2017-04-24 NOTE — Telephone Encounter (Signed)
Eric [name] called back stating that Eric [name] has been diuresing and losing wt, but she is not sure of how much. Eric [name] does restrict his fluids, but sodium restriction is an issue, as his wife does not cook and they eat out frequently. She reports that gets up around 10 am, eats a bowl of cereal and his other meals are usually brought to him he will go out w/ family.  She does report that he is urinating more frequently. She would like to know if Dr. Rockey Situ is ok w/ Eric [name] increasing his activity level and working outside in the yard. His orthopedist cleared him to push his self propelled lawnmower and drive, but she wants to make sure that Dr. Rockey Situ is agreeable w/ this.

## 2017-04-25 NOTE — Telephone Encounter (Signed)
Spoke w/ pt.  Advised her of Dr. Donivan Scull recommendation.   She reports that pt's back doctor wanted him to use the self propelled mower to help w/ his leg.  She will make sure he does not go out in the heat of the day and will call back w/ any further questions or concerns.  She is appreciative of the call.

## 2017-04-26 ENCOUNTER — Other Ambulatory Visit: Payer: Self-pay | Admitting: *Deleted

## 2017-04-26 DIAGNOSIS — I5033 Acute on chronic diastolic (congestive) heart failure: Secondary | ICD-10-CM

## 2017-04-26 MED ORDER — POTASSIUM CHLORIDE CRYS ER 20 MEQ PO TBCR: 20.0000 meq | EXTENDED_RELEASE_TABLET | Freq: Every day | ORAL | 0 refills | Status: DC

## 2017-04-26 NOTE — Telephone Encounter (Signed)
Jenny Reichmann, daughter requested

## 2017-05-17 NOTE — Progress Notes (Signed)
Patient ID: Eric [name], male   DOB: Abercrombie 31, 1933, 83 y.o.   MRN: [id number] Cardiology Office Note  Date:  05/18/2017   ID:  Eric [name], DOB [birth year], MRN [id number]  PCP:  Eric Curry, DO   Chief Complaint  Patient presents with  . other    1 month follow up. Meds reviewed by the pt. verbally. Pt. c/o LE edema.     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 Relatively asymptomatic except for leg swelling which started approximately February or March  Echocardiogram 04/04/2017 with ejection fraction 55-60%, mildly dilated left atrium   Was taking lasix 40 mg x 3 BID at end of April (120 g twice a day) Incorrectly interpreted the instructions, lab work showing prerenal state Since then has been taking 60 mg twice a day  He reports having continued leg swelling,  days with heavy urine output, other days with not much Shortness of breath improving, abdomen less distended and tight Perhaps still some fluid Previous noted to have high fluid intakely   Broken foot in Aug 2017, foot did not heal Had surgery 12/2016 Difficult time recovering  Previous CT scan 2012 documenting coronary calcifications  Walks with a walker, was scheduled to do PT for his fracture on the left foot s/p surgery  Sensation of fullness and weight on his chest at times  comes on at rest  EKG personally reviewed by myself on  todays visit Shows atrial fibrillation with ventricular rate 71 bpm PVCs left axis deviation  Other past medical history reviewed History ofrestless leg at nighttime. He reports having a limited range of motion of his left shoulder, unable to raise his left arm above his shoulder height. Denies any trauma to the left arm  Previous lab work reviewed with him showing hemoglobin A1c 5.7, total cholesterol 144  Previous visit for cortisone shot for his back was put on hold secondary to ectopy .  In followup in our clinic, 30 day Holter monitor was ordered.This has shown frequent PVCs, occasional couplets, triplets. Very rare episodes of dizziness.   PMH:   has a past medical history of Anxiety; Arthritis; Chronic diastolic CHF (congestive heart failure) ([place]); DDD (degenerative disc disease), cervical; Depressive disorder, not elsewhere classified; Depressive disorder, not elsewhere classified; Difficult intubation; DVT (deep venous thrombosis) ([place]); Dysphagia, oral phase; Dyspnea; Edema; History of kidney stones; Hypertension; Hypoxemia; Impacted cerumen; Mixed hyperlipidemia; Nonunion of foot fracture; Other myelopathy; Pain in limb; Palpitations; Paroxysmal Atrial Fibrillation ([place]); Pneumonia (03/06/2003); Spinal stenosis, unspecified region other than cervical; Squamous cell carcinoma of skin of trunk, except scrotum; and Thoracic or lumbosacral neuritis or radiculitis, unspecified.  PSH:    Past Surgical History:  Procedure Laterality Date  . CATARACT EXTRACTION W/ INTRAOCULAR LENS  IMPLANT, BILATERAL    . CERVICAL FUSION  02/10/2011  . history of abd ultrasound  11/01   fatty liver  . MULTIPLE TOOTH EXTRACTIONS    . ORIF FIBULA FRACTURE Left 01/06/2017   Procedure: OPEN  REDUCTION INTERNAL FIXATION (ORIF) FIBULA FRACTURE DISTAL FIBULA;  Surgeon: Melrose Nakayama, MD;  Location: [facility];  Service: Orthopedics;  Laterality: Left;  Patient states has problems if he will have a tube in throat  for Genera; Anesthesia    Current Outpatient Prescriptions  Medication Sig Dispense Refill  . apixaban (ELIQUIS) 5 MG TABS tablet Take 1 tablet (5 mg total) by mouth 2 (two) times daily. 60 tablet 6  . Cholecalciferol (VITAMIN D) 1000 UNITS capsule Take one tablet once daily 90 capsule 3  . furosemide (LASIX) 20 MG tablet Take 60 mg by mouth 2 (two) times daily.    Marland Kitchen gabapentin (NEURONTIN) 600 MG tablet Take 1 tablet (600 mg total) by mouth 3 (three) times daily. For nerve pain 270 tablet 0  . metoprolol (LOPRESSOR) 100 MG tablet Take one tablet by mouth twice daily for heart 180 tablet 3  . potassium chloride SA (K-DUR,KLOR-CON) 20 MEQ tablet Take 1 tablet (20 mEq total) by mouth daily. 30 tablet 0  . pravastatin (PRAVACHOL) 40 MG tablet Take 1 tablet (40 mg total) by mouth daily. 90 tablet 3  . traMADol (ULTRAM) 50 MG tablet Take 1 tablet (50 mg total) by mouth every 6 (six) hours as needed for moderate pain or severe pain. For Back Pain 30 tablet 0   No current facility-administered medications for this visit.      Allergies:   Morphine and related; Percocet [oxycodone-acetaminophen]; and Valium   Social History:  The patient  reports that he has never smoked. He has never used smokeless tobacco. He reports that he does not drink alcohol or use drugs.   Family History:   family history includes Atrial fibrillation in his brother; Cancer in his other; Heart disease in his brother; Stroke in his father and paternal grandfather.    Review of Systems: Review of Systems  Constitutional: Negative.   Respiratory: Positive for shortness of breath.   Cardiovascular: Positive for leg swelling.  Gastrointestinal: Negative.   Musculoskeletal: Negative.        Gait instability, decreased range of motion left arm  Neurological: Negative.   Psychiatric/Behavioral: Negative.   All other systems reviewed and are negative.    PHYSICAL EXAM: VS:  BP 110/60 (BP Location: Left Arm, Patient  Position: Sitting, Cuff Size: Normal)   Pulse 71   Wt 175 lb 8 oz (79.6 kg)   BMI 27.49 kg/m  , BMI Body mass index is 27.49 kg/m. GEN: Well nourished, well developed, in no acute distress ,  presents in a walker HEENT: normal  Neck: no JVD, carotid bruits, or masses Cardiac:  Irregularly irregular, no murmurs, rubs, or gallops, 1+ pitting edema  bilateral lower extremities Respiratory:  Dullness at the bases left greater than right, normal work of breathing GI: soft, nontender, nondistended, + BS MS: no deformity or atrophy  Skin: warm and dry, no rash Neuro:  Strength and sensation are intact Psych: euthymic mood, full affect    Recent Labs: 03/29/2017: Hemoglobin 14.5; Platelets 210; TSH 3.340 04/10/2017: BUN 40; Creat 1.34; Potassium 4.6; Sodium 146    Lipid Panel Lab Results  Component Value Date   CHOL 169 05/16/2016   HDL 37 (L) 05/16/2016   LDLCALC 90 05/16/2016   TRIG 210 (H) 05/16/2016      Wt Readings from Last 3 Encounters:  05/18/17 175 lb 8 oz (79.6 kg)  04/20/17 175 lb 6.4 oz (79.6 kg)  04/10/17 170 lb (77.1 kg)  ASSESSMENT AND PLAN:  PAF (paroxysmal atrial fibrillation) ([place]) - Plan: EKG 12-Lead Rate well controlled, tolerating anticoagulation  He is interested in restoring normal sinus rhythm   he does have coronary artery calcification on CT scan from 2012 We will avoid flecainide, Plan is to start amiodarone 40 mg twice a day for 5 days then down to 200 mg twice a day with follow-up in 2-3 weeks. He was offered cardioversion but declined at this time He will consider in the future if amiodarone does not work  Chronic diastolic CHF (congestive heart failure) ([place]) - Plan: EKG 12-Lead Currently taking Lasix 60 mg twice a day with potassium 20   weight has not changed drastically, only with trace leg edema Appears to have improved shortness of breath and abdominal swelling We'll continue current dose Lasix and check basic metabolic  panel  Essential hypertension - Plan: EKG 12-Lead Blood pressure is well controlled on today's visit. No changes made to the medications.  Hyperlipidemia Continue pravastatin  Lower extremity edema Secondary to fluid retention, brought on by atrial fibrillation Improved compared to previous visits   CAD seen on CT scan 2012   we will discuss with him whether he would like a stress test as he does have periodic chest tightness   Disposition:   F/U  2 to 3 weeks  Long discussion with patient and family in the room, all questions answered  Total encounter time more than 45 minutes  Greater than 50% was spent in counseling and coordination of care with the patient    No orders of the defined types were placed in this encounter.    Signed, Esmond Plants, M.D., Ph.D. 05/18/2017  [place], [place]

## 2017-05-18 ENCOUNTER — Ambulatory Visit (INDEPENDENT_AMBULATORY_CARE_PROVIDER_SITE_OTHER): Payer: PPO | Admitting: Cardiovascular Disease

## 2017-05-18 ENCOUNTER — Encounter: Payer: Self-pay | Admitting: Cardiovascular Disease

## 2017-05-18 VITALS — BP 110/60 | HR 71 | Wt 175.5 lb

## 2017-05-18 DIAGNOSIS — I480 Paroxysmal atrial fibrillation: Secondary | ICD-10-CM

## 2017-05-18 DIAGNOSIS — R600 Localized edema: Secondary | ICD-10-CM | POA: Diagnosis not present

## 2017-05-18 DIAGNOSIS — I10 Essential (primary) hypertension: Secondary | ICD-10-CM | POA: Diagnosis not present

## 2017-05-18 DIAGNOSIS — I5032 Chronic diastolic (congestive) heart failure: Secondary | ICD-10-CM

## 2017-05-18 DIAGNOSIS — E782 Mixed hyperlipidemia: Secondary | ICD-10-CM

## 2017-05-18 MED ORDER — AMIODARONE HCL 200 MG PO TABS: 200.0000 mg | ORAL_TABLET | Freq: Two times a day (BID) | ORAL | 3 refills | Status: DC

## 2017-05-18 NOTE — Patient Instructions (Addendum)
Medication Instructions:   Start amiodarone 2 pills in the Am and 2 pills in the PM for 5 days After 5 days, decrease the dose down to one pill twice a day  EKG in 2 to 3 weeks  Labwork:  BMP today  Testing/Procedures:  No further testing at this time  [place] caregiver has ordered a Stress Test with nuclear imaging. The purpose of this test is to evaluate the blood supply to your heart muscle. This procedure is referred to as a "Non-Invasive Stress Test." This is because other than having an IV started in your vein, nothing is inserted or "invades" your body. Cardiac stress tests are done to find areas of poor blood flow to the heart by determining the extent of coronary artery disease (CAD). Some patients exercise on a treadmill, which naturally increases the blood flow to your heart, while others who are  unable to walk on a treadmill due to physical limitations have a pharmacologic/chemical stress agent called Lexiscan . This medicine will mimic walking on a treadmill by temporarily increasing your coronary blood flow.   Please note: these test [place] take anywhere between 2-4 hours to complete  PLEASE REPORT TO [facility] WILL DIRECT YOU WHERE TO GO  Date of Procedure:_Friday June 15, 2018__  Arrival Time for Procedure:__08:45AM__  Instructions regarding medication:   __X__ : Hold diabetes medication morning of procedure  __X__:  Hold Metoprolol the night before procedure and morning of procedure    PLEASE NOTIFY THE OFFICE AT LEAST 24 HOURS IN ADVANCE IF YOU ARE UNABLE TO KEEP YOUR APPOINTMENT.  [phone] AND  PLEASE NOTIFY NUCLEAR MEDICINE AT [facility] AT LEAST 24 HOURS IN ADVANCE IF YOU ARE UNABLE TO KEEP YOUR APPOINTMENT. [phone]  How to prepare for your Myov[facility] test:  1. Do not eat or drink after midnight 2. No caffeine for 24 hours prior to test 3. No smoking 24 hours prior to test. 4. Your medication  [place] be taken with water.  If your doctor stopped a medication because of this test, do not take that medication. 5. Ladies, please do not wear dresses.  Skirts or pants are appropriate. Please wear a short sleeve shirt. 6. No perfume, cologne or lotion. 7. Wear comfortable walking shoes. No heels!        I recommend watching educational videos on topics of interest to you at:       [email or web address].goemmi.com  Enter code: HEARTCARE    Follow-Up: It was a pleasure seeing you in the office today. Please call us if you have new issues that need to be addressed before your next appt.  [phone]  Your physician wants you to follow-up in: 3 weeks  If you need a refill on your cardiac medications before your next appointment, please call your pharmacy.

## 2017-05-18 NOTE — Addendum Note (Signed)
Addended by: Jule Ser on: 05/18/2017 03:43 PM   Modules accepted: Orders

## 2017-05-19 ENCOUNTER — Ambulatory Visit: Payer: Medicare PPO | Admitting: Cardiovascular Disease

## 2017-05-19 LAB — BASIC METABOLIC PANEL
BUN/Creatinine Ratio: 21 (ref 10–24)
BUN: 25 mg/dL (ref 8–27)
CALCIUM: 9.3 mg/dL (ref 8.6–10.2)
CO2: 22 mmol/L (ref 18–29)
CREATININE: 1.17 mg/dL (ref 0.76–1.27)
Chloride: 105 mmol/L (ref 96–106)
GFR calc Af Amer: 66 mL/min/{1.73_m2} (ref >59)
GFR, EST NON AFRICAN AMERICAN: 57 mL/min/{1.73_m2} — AB (ref >59)
Glucose: 97 mg/dL (ref 65–99)
Potassium: 4.5 mmol/L (ref 3.5–5.2)
Sodium: 142 mmol/L (ref 134–144)

## 2017-05-25 ENCOUNTER — Encounter: Payer: Self-pay | Admitting: Internal Medicine

## 2017-05-25 ENCOUNTER — Ambulatory Visit (INDEPENDENT_AMBULATORY_CARE_PROVIDER_SITE_OTHER): Payer: PPO | Admitting: Internal Medicine

## 2017-05-25 VITALS — BP 120/70 | HR 67 | Temp 97.8°F | Wt 175.0 lb

## 2017-05-25 DIAGNOSIS — M4712 Other spondylosis with myelopathy, cervical region: Secondary | ICD-10-CM | POA: Diagnosis not present

## 2017-05-25 DIAGNOSIS — I10 Essential (primary) hypertension: Secondary | ICD-10-CM

## 2017-05-25 DIAGNOSIS — I5033 Acute on chronic diastolic (congestive) heart failure: Secondary | ICD-10-CM | POA: Diagnosis not present

## 2017-05-25 DIAGNOSIS — Z8781 Personal history of (healed) traumatic fracture: Secondary | ICD-10-CM

## 2017-05-25 DIAGNOSIS — M48062 Spinal stenosis, lumbar region with neurogenic claudication: Secondary | ICD-10-CM | POA: Diagnosis not present

## 2017-05-25 DIAGNOSIS — Z967 Presence of other bone and tendon implants: Secondary | ICD-10-CM

## 2017-05-25 DIAGNOSIS — I5032 Chronic diastolic (congestive) heart failure: Secondary | ICD-10-CM

## 2017-05-25 DIAGNOSIS — I480 Paroxysmal atrial fibrillation: Secondary | ICD-10-CM | POA: Diagnosis not present

## 2017-05-25 DIAGNOSIS — M5412 Radiculopathy, cervical region: Secondary | ICD-10-CM

## 2017-05-25 DIAGNOSIS — Z9889 Other specified postprocedural states: Secondary | ICD-10-CM

## 2017-05-25 MED ORDER — POTASSIUM CHLORIDE CRYS ER 20 MEQ PO TBCR: 20.0000 meq | EXTENDED_RELEASE_TABLET | Freq: Every day | ORAL | 3 refills | Status: DC

## 2017-05-25 MED ORDER — METOPROLOL TARTRATE 100 MG PO TABS: ORAL_TABLET | ORAL | 3 refills | Status: DC

## 2017-05-25 NOTE — Progress Notes (Signed)
Location:  [facility] clinic Provider:  Chip Canepa L. Mariea Clonts, D.O., C.M.D.  Goals of Care:  Advanced Directives 05/25/2017  Does Patient Have a Medical Advance Directive? No  Type of Advance Directive -  Does patient want to make changes to medical advance directive? -  Copy of [place] in Chart? -  Would patient like information on creating a medical advance directive? No - Patient declined     Chief Complaint  Patient presents with  . Medical Management of Chronic Issues    4 week follow-up    HPI: Patient is a 83 y.o. male seen today for medical management of chronic diseases.    He is now using a brace on left leg. It's much better, but still on pain medication with tramadol after fracture.  Was "cleared" to work out in the yard.  Still not steady with the brace.    Right leg is swelled up, but otherwise edema was improving. Right leg is still "dead" since his lumbar spinal stenosis.   He is off the amiodarone high dose.  Yesterday, he was down to the 200mg  po bid.  He was very weak yesterday.  He didn't sleep last night.  His heart jerks him all of a sudden.  He feels like it stops and starts again with the skipping.  5am he got up for a bath.  Cat knows when he doesn't feel well and stays with him.  If he tells him he'll be ok, he quits meowing.   Still has pressure in the chest that comes and goes and still has a cough.  Some nights cough is worse and sometimes ok.  It's mostly when he lies down at night.  He has a stress test tomorrow am.    Past Medical History:  Diagnosis Date  . Anxiety   . Arthritis   . Chronic diastolic CHF (congestive heart failure) ([place])    a. 01/2011 Echo: EF 50-55%, gr1 DD, mild AI, nl RV fxn, mild TR/PR.  . DDD (degenerative disc disease), cervical   . Depressive disorder, not elsewhere classified   . Depressive disorder, not elsewhere classified   . Difficult intubation   . DVT (deep venous thrombosis) ([place])   . Dysphagia, oral  phase   . Dyspnea   . Edema   . History of kidney stones   . Hypertension   . Hypoxemia   . Impacted cerumen   . Mixed hyperlipidemia   . 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 Other myelopathy   . Pain in limb   . Palpitations   . Paroxysmal Atrial Fibrillation ([place])    a. a. 01/2011 in setting of post-op complications including aspiration pna;  b. CHA2DS2VASc = 4.  . Pneumonia 03/06/2003  . Spinal stenosis, unspecified region other than cervical   . 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 Thoracic or lumbosacral neuritis or radiculitis, unspecified     Past Surgical History:  Procedure Laterality Date  . CATARACT EXTRACTION W/ INTRAOCULAR LENS  IMPLANT, BILATERAL    . CERVICAL FUSION  02/10/2011  . history of abd ultrasound  11/01   fatty liver  . MULTIPLE TOOTH EXTRACTIONS    . ORIF FIBULA FRACTURE Left 01/06/2017   Procedure: OPEN REDUCTION INTERNAL FIXATION (ORIF) FIBULA FRACTURE DISTAL FIBULA;  Surgeon: Melrose Nakayama, MD;  Location: [facility];  Service: Orthopedics;  Laterality: Left;  Patient  states has problems if he will have a tube in throat for Genera; Anesthesia    Allergies  Allergen Reactions  . Morphine And Related Shortness Of Breath  . Percocet [Oxycodone-Acetaminophen] Shortness Of Breath  . Valium Shortness Of Breath    Allergies as of 05/25/2017      Reactions   Morphine And Related Shortness Of Breath   Percocet [oxycodone-acetaminophen] Shortness Of Breath   Valium Shortness Of Breath      Medication List       Accurate as of 05/25/17 11:26 AM. Always use your most recent med list.          amiodarone 200 MG tablet Commonly known as:  PACERONE Take 1 tablet (200 mg total) by mouth 2 (two) times daily.   apixaban 5 MG Tabs tablet Commonly known as:  ELIQUIS Take 1 tablet (5 mg total) by mouth 2 (two) times daily.   furosemide 20 MG tablet Commonly known as:  LASIX Take 60  mg by mouth 2 (two) times daily.   gabapentin 600 MG tablet Commonly known as:  NEURONTIN Take 1 tablet (600 mg total) by mouth 3 (three) times daily. For nerve pain   metoprolol tartrate 100 MG tablet Commonly known as:  LOPRESSOR Take one tablet by mouth twice daily for heart   potassium chloride SA 20 MEQ tablet Commonly known as:  K-DUR,KLOR-CON Take 1 tablet (20 mEq total) by mouth daily.   pravastatin 40 MG tablet Commonly known as:  PRAVACHOL Take 1 tablet (40 mg total) by mouth daily.   traMADol 50 MG tablet Commonly known as:  ULTRAM Take 1 tablet (50 mg total) by mouth every 6 (six) hours as needed for moderate pain or severe pain. For Back Pain   Vitamin D 1000 units capsule Take one tablet once daily       Review of Systems:  Review of Systems  Constitutional: Positive for malaise/fatigue. Negative for chills and fever.  HENT: Positive for hearing loss. Negative for congestion.   Eyes: Negative for blurred vision.       Glasses  Respiratory: Positive for cough. Negative for shortness of breath.   Cardiovascular: Positive for palpitations and orthopnea. Negative for chest pain, claudication, leg swelling and PND.  Gastrointestinal: Negative for abdominal pain, blood in stool, constipation and melena.  Genitourinary: Negative for dysuria.  Musculoskeletal: Positive for falls and joint pain.  Neurological: Positive for tingling, sensory change and weakness. Negative for dizziness.       Right leg "dead" due to his spinal stenosis  Endo/Heme/Allergies: Bruises/bleeds easily.  Psychiatric/Behavioral: Negative for memory loss. The patient is not nervous/anxious.     Health Maintenance  Topic Date Due  . COLONOSCOPY  08/30/2015  . INFLUENZA VACCINE  07/12/2017  . TETANUS/TDAP  10/15/2023  . PNA vac Low Risk Adult  Completed    Physical Exam: Vitals:   05/25/17 1101  BP: 120/70  Pulse: 67  Temp: 97.8 F (36.6 C)  TempSrc: Oral  SpO2: 93%  Weight: 175  lb (79.4 kg)   Body mass index is 27.41 kg/m. Physical Exam  Constitutional: He is oriented to person, place, and time. He appears well-developed and well-nourished.  HENT:  HOH  Eyes:  glasses  Cardiovascular: Normal rate, regular rhythm, normal heart sounds and intact distal pulses.   Pulmonary/Chest: Effort normal and breath sounds normal. He has no rales.  Abdominal: Bowel sounds are normal.  Musculoskeletal:  Walks with limp, uses cane, brace on left leg  Neurological:  He is alert and oriented to person, place, and time.  Skin: Skin is warm and dry.  Psychiatric: He has a normal mood and affect.    Labs reviewed: Basic Metabolic Panel:  Recent Labs  03/29/17 1503 04/10/17 1145 05/18/17 1520  NA 140 146 142  K 4.6 4.6 4.5  CL 104 105 105  CO2 21 23 22   GLUCOSE 80 89 97  BUN 27 40* 25  CREATININE 1.02 1.34* 1.17  CALCIUM 9.5 10.1 9.3  TSH 3.340  --   --    Liver Function Tests: No results for input(s): AST, ALT, ALKPHOS, BILITOT, PROT, ALBUMIN in the last 8760 hours. No results for input(s): LIPASE, AMYLASE in the last 8760 hours. No results for input(s): AMMONIA in the last 8760 hours. CBC:  Recent Labs  01/06/17 1026 03/27/17 1530 03/29/17 1503  WBC 6.7 6.9 6.5  NEUTROABS  --  3,588  --   HGB 15.2 14.2 14.5  HCT 45.2 42.8 43.4  MCV 94.8 97.3 97  PLT 182 207 210   Lipid Panel: No results for input(s): CHOL, HDL, LDLCALC, TRIG, CHOLHDL, LDLDIRECT in the last 8760 hours. Lab Results  Component Value Date   HGBA1C 5.7 (H) 05/16/2016    Procedures since last visit: Cardiology and ortho notes reviewed  Assessment/Plan 1. Paroxysmal atrial fibrillation (HCC) -sounds like he is more likely having PVCs/PACs overnight b/c of short duration of symptoms (see hpi) -cont metoprolol, amiodarone and monitor, keep cardiology f/u appts  2. Chronic diastolic CHF (congestive heart failure) (HCC) -much improved -still has some mild cough worse at night  (?orthopnea), but no rales audible on exam -cont lasix as per cardiology and potassium, monitor bmp -reinforced hydration when spending time outside in heat   3. Cervical myelopathy with cervical radiculopathy -chronic weakness and pain related to this and his lumbar spinal stenosis -cont gabapentin for neuropathic pain  4. Spinal stenosis, lumbar region, with neurogenic claudication -has "dead" right leg from this, cont gabapentin  5. HYPERTENSION, BENIGN ESSENTIAL -bp at goal, cont his lopressor, monitor for orthostasis, he's a major fall risk with the brace on left leg, "dead" right leg, cspine and lumbar spine disease, neuropathy  6. S/P ORIF (open reduction internal fixation) fracture -left leg, cont brace and be very careful when outside--use cane or walker at all times to prevent recurrent falls -should have bone density, too  Labs/tests ordered:  No orders of the defined types were placed in this encounter.  Next appt:  3-4 mos on AVS, but no appt made when he left  Alexxis Mackert L. Sujey Gundry, D.O. [facility] [address],  [address] Cell Phone (Mon-Fri 8am-5pm):  [phone] On Call:  [phone] & follow prompts after 5pm & weekends Office Phone:  [phone] Office Fax:  [phone]

## 2017-05-26 ENCOUNTER — Encounter
Admission: RE | Admit: 2017-05-26 | Discharge: 2017-05-26 | Disposition: A | Discharge status: Home / Self Care | Payer: PPO | Source: Ambulatory Visit | Attending: Cardiovascular Disease | Admitting: Cardiovascular Disease

## 2017-05-26 DIAGNOSIS — R600 Localized edema: Secondary | ICD-10-CM | POA: Diagnosis not present

## 2017-05-26 DIAGNOSIS — E782 Mixed hyperlipidemia: Secondary | ICD-10-CM | POA: Insufficient documentation

## 2017-05-26 DIAGNOSIS — I10 Essential (primary) hypertension: Secondary | ICD-10-CM | POA: Insufficient documentation

## 2017-05-26 DIAGNOSIS — I5032 Chronic diastolic (congestive) heart failure: Secondary | ICD-10-CM | POA: Diagnosis not present

## 2017-05-26 DIAGNOSIS — I480 Paroxysmal atrial fibrillation: Secondary | ICD-10-CM | POA: Insufficient documentation

## 2017-05-26 MED ORDER — REGADENOSON 0.4 MG/5ML IV SOLN
0.4000 mg | Freq: Once | INTRAVENOUS | Status: AC
  Administered 2017-05-26: 0.4 mg via INTRAVENOUS

## 2017-05-26 MED ORDER — TECHNETIUM TC 99M TETROFOSMIN IV KIT
13.1300 | PACK | Freq: Once | INTRAVENOUS | Status: AC | PRN
  Administered 2017-05-26: 13.13 via INTRAVENOUS

## 2017-05-26 MED ORDER — TECHNETIUM TC 99M TETROFOSMIN IV KIT
30.7400 | PACK | Freq: Once | INTRAVENOUS | Status: AC | PRN
  Administered 2017-05-26: 30.74 via INTRAVENOUS

## 2017-05-27 LAB — NM MYOCAR MULTI W/SPECT W/WALL MOTION / EF
CHL CUP NUCLEAR SDS: 10
CHL CUP RESTING HR STRESS: 63 {beats}/min
CSEPPHR: 82 {beats}/min
LVDIAVOL: 55 mL (ref 62–150)
LVSYSVOL: 14 mL
Percent HR: 59 %
SRS: 0
SSS: 5
TID: 0.83

## 2017-06-01 ENCOUNTER — Ambulatory Visit (INDEPENDENT_AMBULATORY_CARE_PROVIDER_SITE_OTHER): Payer: PPO | Admitting: *Deleted

## 2017-06-01 VITALS — BP 132/58 | HR 54 | Ht 67.0 in | Wt 177.0 lb

## 2017-06-01 DIAGNOSIS — Z01818 Encounter for other preprocedural examination: Secondary | ICD-10-CM | POA: Diagnosis not present

## 2017-06-01 DIAGNOSIS — I4892 Unspecified atrial flutter: Secondary | ICD-10-CM | POA: Diagnosis not present

## 2017-06-01 NOTE — Patient Instructions (Addendum)
Medication Instructions:  Your physician recommends that you continue on your current medications as directed. Please refer to the Current Medication list given to you today.   Labwork: Your physician recommends that you return for lab work in: sometime the [place] June 12, 2017 AT [place]. - Please go to the [facility]. You will check in at the front desk to the right as you walk into the atrium. Valet Parking is offered if needed.     Testing/Procedures: You are scheduled for a Cardioversion on __7/10/18____ with Dr.___GOLLAN. Please arrive at the [place] [facility] at _06:30  a.m. on the day of your procedure.  DIET INSTRUCTIONS:  Nothing to eat or drink after midnight except your medications with a small sip of water.         1) Labs: _Sometime the week of June 12, 2017 (CBC, BMP, PT/INR) at the [facility].  2) Medications:  DO not take your Metoprolol the morning of your procedure.  3) Must have a responsible person to drive you home.  4) Bring a current list of your medications and current insurance cards.    If you have any questions after you get home, please call the office at [phone]   Follow-Up: Your physician recommends that you schedule a follow-up appointment TO BE DETERMINED.  If you need a refill on your cardiac medications before your next appointment, please call your pharmacy.    Electrical Cardioversion Electrical cardioversion is the delivery of a jolt of electricity to restore a normal rhythm to the heart. A rhythm that is too fast or is not regular keeps the heart from pumping well. In this procedure, sticky patches or metal paddles are placed on the chest to deliver electricity to the heart from a device. This procedure Godman be done in an emergency if:  There is low or no blood pressure as a result of the heart rhythm.  Normal rhythm must be restored as fast as possible to protect the brain and heart from further damage.  It  Advincula save a life.  This procedure Gosser also be done for irregular or fast heart rhythms that are not immediately life-threatening. Tell a health care provider about:  Any allergies you have.  All medicines you are taking, including vitamins, herbs, eye drops, creams, and over-the-counter medicines.  Any problems you or family members have had with anesthetic medicines.  Any blood disorders you have.  Any surgeries you have had.  Any medical conditions you have.  Whether you are pregnant or Marconi be pregnant. What are the risks? Generally, this is a safe procedure. However, problems Orzechowski occur, including:  Allergic reactions to medicines.  A blood clot that breaks free and travels to other parts of your body.  The possible return of an abnormal heart rhythm within hours or days after the procedure.  Your heart stopping (cardiac arrest). This is rare.  What happens before the procedure? Medicines  Your health care provider Calcaterra have you start taking: ? Blood-thinning medicines (anticoagulants) so your blood does not clot as easily. ? Medicines Harron be given to help stabilize your heart rate and rhythm.  Ask your health care provider about changing or stopping your regular medicines. This is especially important if you are taking diabetes medicines or blood thinners. General instructions  Plan to have someone take you home from the hospital or clinic.  If you will be going home right after the procedure, plan to have someone  with you for 24 hours.  Follow instructions from your health care provider about eating or drinking restrictions. What happens during the procedure?  To lower your risk of infection: ? Your health care team will wash or sanitize their hands. ? Your skin will be washed with soap.  An IV tube will be inserted into one of your veins.  You will be given a medicine to help you relax (sedative).  Sticky patches (electrodes) or metal paddles Vaness be placed on  your chest.  An electrical shock will be delivered. The procedure Beigel vary among health care providers and hospitals. What happens after the procedure?  Your blood pressure, heart rate, breathing rate, and blood oxygen level will be monitored until the medicines you were given have worn off.  Do not drive for 24 hours if you were given a sedative.  Your heart rhythm will be watched to make sure it does not change. This information is not intended to replace advice given to you by your health care provider. Make sure you discuss any questions you have with your health care provider. Document Released: 11/18/2002 Document Revised: 07/27/2016 Document Reviewed: 06/03/2016 Elsevier Interactive Patient Education  2017 [employer].

## 2017-06-01 NOTE — Progress Notes (Signed)
1.) Reason for visit: EKG check  2.) Name of MD requesting visit: Dr Rockey Situ  3.) H&P: atrial fib, SOB, chronic diastolic heart failure  ROS related to problem: Patient here today for EKG check. Patient reports occasional SOB when he says "I'm in A. Fib." Patient states he thinks he goes in and out of a. Fib every couple hours or so. Denies chest pain, dizziness, or palpitations.   EKG performed per Dr Donivan Scull note from 05/18/17: "Plan is to start amiodarone 40 mg twice a day for 5 days then down to 200 mg twice a day with follow-up in 2-3 weeks. He was offered cardioversion but declined at this time. He will consider in the future if amiodarone does not work."  4.) Assessment and plan per MD: EKG performed and meds reviewed. Ignacia Bayley, NP reviewed EKG and confirmed patient in A. Flutter today. He advised for patient to hold metoprolol the morning of cardioversion and proceed to schedule as advised per Dr Rockey Situ.   Patient and family agreeable to cardioversion. Offered procedure on 06/15/17, but patient/family declined as some family members were going to be out of town that day. Agreed to have cardioversion on 06/20/17 at 07:30 am with arrival time of 6:30 am by Dr Rockey Situ. Family really wants Dr Rockey Situ to be the physician to perform DCCV. Pre-procedural instructions given to patient and family and they verbalized understanding. Patient will f/u with Dr Rockey Situ 1 week post cardioversion.

## 2017-06-02 ENCOUNTER — Ambulatory Visit: Payer: PPO

## 2017-06-02 DIAGNOSIS — M25572 Pain in left ankle and joints of left foot: Secondary | ICD-10-CM | POA: Diagnosis not present

## 2017-06-16 ENCOUNTER — Ambulatory Visit: Payer: PPO | Admitting: Cardiovascular Disease

## 2017-06-16 ENCOUNTER — Other Ambulatory Visit
Admission: RE | Admit: 2017-06-16 | Discharge: 2017-06-16 | Disposition: A | Discharge status: Home / Self Care | Payer: PPO | Source: Ambulatory Visit | Attending: Cardiovascular Disease | Admitting: Cardiovascular Disease

## 2017-06-16 DIAGNOSIS — I4892 Unspecified atrial flutter: Secondary | ICD-10-CM | POA: Diagnosis not present

## 2017-06-16 DIAGNOSIS — Z01818 Encounter for other preprocedural examination: Secondary | ICD-10-CM | POA: Diagnosis not present

## 2017-06-16 LAB — CBC WITH DIFFERENTIAL/PLATELET
BASOS PCT: 1 %
Basophils Absolute: 0.1 10*3/uL (ref 0–0.1)
Eosinophils Absolute: 0.1 10*3/uL (ref 0–0.7)
Eosinophils Relative: 1 %
HEMATOCRIT: 44.2 % (ref 40.0–52.0)
Hemoglobin: 15.1 g/dL (ref 13.0–18.0)
Lymphocytes Relative: 38 %
Lymphs Abs: 2.3 10*3/uL (ref 1.0–3.6)
MCH: 32.3 pg (ref 26.0–34.0)
MCHC: 34.1 g/dL (ref 32.0–36.0)
MCV: 94.5 fL (ref 80.0–100.0)
MONO ABS: 0.5 10*3/uL (ref 0.2–1.0)
MONOS PCT: 8 %
NEUTROS ABS: 3.1 10*3/uL (ref 1.4–6.5)
Neutrophils Relative %: 52 %
Platelets: 205 10*3/uL (ref 150–440)
RBC: 4.68 MIL/uL (ref 4.40–5.90)
RDW: 13.6 % (ref 11.5–14.5)
WBC: 6.1 10*3/uL (ref 3.8–10.6)

## 2017-06-16 LAB — BASIC METABOLIC PANEL WITH GFR
Anion gap: 8 (ref 5–15)
BUN: 28 mg/dL — ABNORMAL HIGH (ref 6–20)
CO2: 28 mmol/L (ref 22–32)
Calcium: 9.8 mg/dL (ref 8.9–10.3)
Chloride: 104 mmol/L (ref 101–111)
Creatinine, Ser: 1.53 mg/dL — ABNORMAL HIGH (ref 0.61–1.24)
GFR calc Af Amer: 47 mL/min — ABNORMAL LOW
GFR calc non Af Amer: 40 mL/min — ABNORMAL LOW
Glucose, Bld: 91 mg/dL (ref 65–99)
Potassium: 4.9 mmol/L (ref 3.5–5.1)
Sodium: 140 mmol/L (ref 135–145)

## 2017-06-16 LAB — PROTIME-INR
INR: 1.42
PROTHROMBIN TIME: 17.5 s — AB (ref 11.4–15.2)

## 2017-06-19 ENCOUNTER — Telehealth: Payer: Self-pay | Admitting: *Deleted

## 2017-06-19 MED ORDER — FUROSEMIDE 20 MG PO TABS
40.0000 mg | ORAL_TABLET | Freq: Two times a day (BID) | ORAL | 3 refills | Status: DC
Start: 2017-06-19 — End: 2017-10-21

## 2017-06-19 NOTE — Telephone Encounter (Signed)
-----   Message from [name], MD sent at 06/18/2017 12:56 PM EDT ----- BMP results Appears prerenal Creatinine now elevated, Would decrease lasix down to 40 mg BID, increase fluid intake slightly

## 2017-06-19 NOTE — Telephone Encounter (Signed)
Reviewed results and recommendations with patient. Instructed him to decrease furosemide to 40 mg twice daily and slight increase in fluid intake. He also wanted to review arrival time for procedure scheduled tomorrow. Reviewed recommendations and information with him and he verbalized understanding with no further questions.

## 2017-06-20 ENCOUNTER — Encounter: Payer: Self-pay | Admitting: Cardiovascular Disease

## 2017-06-20 ENCOUNTER — Ambulatory Visit: Payer: PPO | Admitting: Anesthesiology

## 2017-06-20 ENCOUNTER — Other Ambulatory Visit: Payer: Self-pay | Admitting: Cardiovascular Disease

## 2017-06-20 ENCOUNTER — Ambulatory Visit
Admission: RE | Admit: 2017-06-20 | Discharge: 2017-06-20 | Disposition: A | Discharge status: Hospice | Payer: PPO | Source: Ambulatory Visit | Attending: Cardiovascular Disease | Admitting: Cardiovascular Disease

## 2017-06-20 ENCOUNTER — Encounter
Admission: RE | Disposition: A | Discharge status: Hospice | Payer: Self-pay | Source: Ambulatory Visit | Attending: Cardiovascular Disease

## 2017-06-20 DIAGNOSIS — E669 Obesity, unspecified: Secondary | ICD-10-CM | POA: Diagnosis not present

## 2017-06-20 DIAGNOSIS — F419 Anxiety disorder, unspecified: Secondary | ICD-10-CM | POA: Insufficient documentation

## 2017-06-20 DIAGNOSIS — I10 Essential (primary) hypertension: Secondary | ICD-10-CM | POA: Diagnosis not present

## 2017-06-20 DIAGNOSIS — Z885 Allergy status to narcotic agent status: Secondary | ICD-10-CM | POA: Insufficient documentation

## 2017-06-20 DIAGNOSIS — I110 Hypertensive heart disease with heart failure: Secondary | ICD-10-CM | POA: Diagnosis not present

## 2017-06-20 DIAGNOSIS — Z888 Allergy status to other drugs, medicaments and biological substances status: Secondary | ICD-10-CM | POA: Insufficient documentation

## 2017-06-20 DIAGNOSIS — I4891 Unspecified atrial fibrillation: Secondary | ICD-10-CM | POA: Diagnosis not present

## 2017-06-20 DIAGNOSIS — Z79899 Other long term (current) drug therapy: Secondary | ICD-10-CM | POA: Insufficient documentation

## 2017-06-20 DIAGNOSIS — I480 Paroxysmal atrial fibrillation: Secondary | ICD-10-CM | POA: Diagnosis not present

## 2017-06-20 DIAGNOSIS — F329 Major depressive disorder, single episode, unspecified: Secondary | ICD-10-CM | POA: Insufficient documentation

## 2017-06-20 DIAGNOSIS — I481 Persistent atrial fibrillation: Secondary | ICD-10-CM | POA: Diagnosis not present

## 2017-06-20 DIAGNOSIS — I493 Ventricular premature depolarization: Secondary | ICD-10-CM | POA: Diagnosis not present

## 2017-06-20 DIAGNOSIS — M1990 Unspecified osteoarthritis, unspecified site: Secondary | ICD-10-CM | POA: Diagnosis not present

## 2017-06-20 DIAGNOSIS — Z87891 Personal history of nicotine dependence: Secondary | ICD-10-CM | POA: Insufficient documentation

## 2017-06-20 DIAGNOSIS — Z86718 Personal history of other venous thrombosis and embolism: Secondary | ICD-10-CM | POA: Insufficient documentation

## 2017-06-20 DIAGNOSIS — I351 Nonrheumatic aortic (valve) insufficiency: Secondary | ICD-10-CM | POA: Insufficient documentation

## 2017-06-20 DIAGNOSIS — Z7901 Long term (current) use of anticoagulants: Secondary | ICD-10-CM | POA: Insufficient documentation

## 2017-06-20 DIAGNOSIS — I5032 Chronic diastolic (congestive) heart failure: Secondary | ICD-10-CM | POA: Diagnosis not present

## 2017-06-20 DIAGNOSIS — E782 Mixed hyperlipidemia: Secondary | ICD-10-CM | POA: Insufficient documentation

## 2017-06-20 DIAGNOSIS — F418 Other specified anxiety disorders: Secondary | ICD-10-CM | POA: Diagnosis not present

## 2017-06-20 DIAGNOSIS — Z6827 Body mass index (BMI) 27.0-27.9, adult: Secondary | ICD-10-CM | POA: Diagnosis not present

## 2017-06-20 HISTORY — PX: CARDIOVERSION: EP1203

## 2017-06-20 SURGERY — CARDIOVERSION (CATH LAB)
Anesthesia: General

## 2017-06-20 MED ORDER — SODIUM CHLORIDE 0.9 % IV SOLN
INTRAVENOUS | Status: DC | PRN
  Administered 2017-06-20: 08:00:00 via INTRAVENOUS

## 2017-06-20 MED ORDER — LIDOCAINE HCL (PF) 2 % IJ SOLN
INTRAMUSCULAR | Status: AC
  Filled 2017-06-20: qty 2

## 2017-06-20 MED ORDER — PROPOFOL 10 MG/ML IV BOLUS
INTRAVENOUS | Status: AC
  Filled 2017-06-20: qty 20

## 2017-06-20 MED ORDER — PROPOFOL 10 MG/ML IV BOLUS
INTRAVENOUS | Status: DC | PRN
  Administered 2017-06-20: 50 mg via INTRAVENOUS

## 2017-06-20 MED ORDER — LIDOCAINE HCL (PF) 2 % IJ SOLN
INTRAMUSCULAR | Status: DC | PRN
  Administered 2017-06-20: 40 mg via INTRADERMAL

## 2017-06-20 NOTE — Transfer of Care (Signed)
Immediate Anesthesia Transfer of Care Note  Patient: Eric [name]  Procedure(s) Performed: Procedure(s): CARDIOVERSION (N/A)  Patient Location: Cath Lab  Anesthesia Type:General  Level of Consciousness: awake, drowsy and patient cooperative  Airway & Oxygen Therapy: Patient Spontanous Breathing and Patient connected to nasal cannula oxygen  Post-op Assessment: Report given to RN, Post -op Vital signs reviewed and stable and Patient moving all extremities X 4  Post vital signs: Reviewed and stable  Last Vitals:  Vitals:   06/20/17 0748 06/20/17 0749  BP:  126/68  Pulse: 63 62  Resp: 16 14  Temp:  (!) 36 C    Last Pain:  Vitals:   06/20/17 0749  TempSrc: Axillary         Complications: No apparent anesthesia complications

## 2017-06-20 NOTE — H&P (Signed)
H&P Addendum, pre-cardioversion  Patient was seen and evaluated prior to -cardioversion procedure Symptoms, prior testing details again confirmed with the patient Patient examined, no significant change from prior exam Lab work reviewed in detail personally by myself Patient understands risk and benefit of the procedure, willing to proceed  Signed, Tim Gollan, MD, Ph.D [facility]  

## 2017-06-20 NOTE — Anesthesia Preprocedure Evaluation (Signed)
Anesthesia Evaluation  Patient identified by MRN, date of birth, ID band Patient awake    Reviewed: Allergy & Precautions, NPO status , Patient's Chart, lab work & pertinent test results  History of Anesthesia Complications Negative for: history of anesthetic complications (reports hx of problems with aspiration post neck surgery)  Airway Mallampati: III  TM Distance: >3 FB Neck ROM: Full    Dental  (+) Poor Dentition, Partial Lower   Pulmonary neg sleep apnea, neg COPD,    breath sounds clear to auscultation- rhonchi (-) wheezing      Cardiovascular hypertension, Pt. on medications +CHF (preserved EF)  (-) CAD, (-) Past MI and (-) Cardiac Stents + dysrhythmias Atrial Fibrillation  Rhythm:Regular Rate:Normal - Systolic murmurs and - Diastolic murmurs Echo 2/35/57: - Left ventricle: The cavity size was normal. There was mild   concentric hypertrophy. Systolic function was normal. The   estimated ejection fraction was in the range of 55% to 60%. Wall   motion was normal; there were no regional wall motion   abnormalities. The study was not technically sufficient to allow   evaluation of LV diastolic dysfunction due to atrial   fibrillation. - Aortic valve: There was mild regurgitation. - Left atrium: The atrium was mildly dilated.   Neuro/Psych PSYCHIATRIC DISORDERS Anxiety Depression negative neurological ROS     GI/Hepatic negative GI ROS, Neg liver ROS,   Endo/Other  negative endocrine ROSneg diabetes  Renal/GU negative Renal ROS     Musculoskeletal  (+) Arthritis ,   Abdominal (+) - obese,   Peds  Hematology  (+) anemia ,   Anesthesia Other Findings Past Medical History: No date: Anxiety No date: Arthritis No date: Chronic diastolic CHF (congestive heart failur*     Comment: a. 01/2011 Echo: EF 50-55%, gr1 DD, mild AI, nl              RV fxn, mild TR/PR. 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 a. 01/2011 in setting of post-op               complications including aspiration pna;  b.               CHA2DS2VASc = 4. 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, exce*     Comment: skin cancer of shoulder No date: Thoracic or lumbosacral neuritis or radiculiti*   Reproductive/Obstetrics                             Anesthesia Physical Anesthesia Plan  ASA: III  Anesthesia Plan: General   Post-op Pain Management:    Induction: Intravenous  PONV Risk Score and Plan: 1 and Propofol  Airway Management Planned: Natural Airway  Additional Equipment:   Intra-op Plan:   Post-operative Plan:   Informed Consent: I have reviewed the patients History and  Physical, chart, labs and discussed the procedure including the risks, benefits and alternatives for the proposed anesthesia with the patient or authorized representative who has indicated his/her understanding and acceptance.   Dental advisory given  Plan Discussed with: CRNA and Anesthesiologist  Anesthesia Plan Comments:         Anesthesia Quick Evaluation

## 2017-06-20 NOTE — Anesthesia Post-op Follow-up Note (Cosign Needed)
Anesthesia QCDR form completed.        

## 2017-06-20 NOTE — Anesthesia Postprocedure Evaluation (Signed)
Anesthesia Post Note  Patient: Eric [name]  Procedure(s) Performed: Procedure(s) (LRB): CARDIOVERSION (N/A)  Patient location: specials recovery. Anesthesia Type: General Level of consciousness: awake and alert and oriented Pain management: pain level controlled Vital Signs Assessment: post-procedure vital signs reviewed and stable Respiratory status: spontaneous breathing, nonlabored ventilation and respiratory function stable Cardiovascular status: blood pressure returned to baseline and stable Postop Assessment: no signs of nausea or vomiting Anesthetic complications: no     Last Vitals:  Vitals:   06/20/17 0805 06/20/17 0815  BP: 116/76 140/87  Pulse: 61 67  Resp: 11 16  Temp:      Last Pain:  Vitals:   06/20/17 0749  TempSrc: Axillary                 [name]

## 2017-06-20 NOTE — CV Procedure (Signed)
Cardioversion procedure note For atrial fibrillation.  Procedure Details:  Consent: Risks of procedure as well as the alternatives and risks of each were explained to the (patient/caregiver). Consent for procedure obtained.  Time Out: Verified patient identification, verified procedure, site/side was marked, verified correct patient position, special equipment/implants available, medications/allergies/relevent history reviewed, required imaging and test results available. Performed  Patient placed on cardiac monitor, pulse oximetry, supplemental oxygen as necessary.  Sedation given: propofol IV, Dr. Randa Lynn Pacer pads placed anterior and posterior chest.   Cardioverted 1 time(s).  Cardioverted at  150 J. Synchronized biphasic Converted to NSR   Evaluation: Findings: Post procedure EKG shows: NSR Complications: None Patient did tolerate procedure well.  Time Spent Directly with the Patient:  34 minutes   Esmond Plants, M.D., Ph.D.

## 2017-06-22 ENCOUNTER — Other Ambulatory Visit: Payer: Self-pay | Admitting: *Deleted

## 2017-06-23 DIAGNOSIS — R3129 Other microscopic hematuria: Secondary | ICD-10-CM | POA: Diagnosis not present

## 2017-06-23 DIAGNOSIS — R05 Cough: Secondary | ICD-10-CM | POA: Diagnosis not present

## 2017-06-23 DIAGNOSIS — R109 Unspecified abdominal pain: Secondary | ICD-10-CM | POA: Diagnosis not present

## 2017-06-23 DIAGNOSIS — R319 Hematuria, unspecified: Secondary | ICD-10-CM | POA: Diagnosis not present

## 2017-06-29 NOTE — Progress Notes (Signed)
Patient ID: Eric [name], male   DOB: June 17, 1932, 83 y.o.   MRN: [id number] Cardiology Office Note  Date:  06/30/2017   ID:  Eric [name], DOB 12/14/1931, MRN [id number]  PCP:  Eric Curry, DO   Chief Complaint  Patient presents with  . other    1 week follow up from cardioversion. Patient c/o Swelling in legs and ankles. Meds reviewed verbally with patient.     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 That was the day of the kidney stone  Having a little kidney stone pain today, better than last 2023-03-19 Does not think he has passed the stone  Plus constipated on xray, possibly from dehydration  Lab work 2 weeks ago showing creatinine up to 1.5, was taking Lasix 40 3 times a day   we  recommended he decrease down to 40 twice a day   Back in NSR on EKG today On last office visit had atrial fibrillation  Atrial fibrillation developed likely sometime within the past several months Normal sinus rhythm June 2017 Atrial fibrillation April 2018. Relatively asymptomatic except for leg swelling which started approximately February or March  Echocardiogram 04/04/2017 with ejection  fraction 55-60%, mildly dilated left atrium   EKG personally reviewed by myself on todays visit Shows normal sinus rhythm rate 62 bpm first degree AV block  Other past medical history reviewed  Was taking lasix 40 mg x 3 BID at end of April (120 g twice a day) Incorrectly interpreted the instructions, lab work showing prerenal state Since then has been taking 60 mg twice a day Broken foot in Aug 2017, foot did not heal Had surgery 12/2016 Difficult time recovering  Previous CT scan 2012 documenting coronary calcifications  Walks with a walker, was scheduled to do PT for his fracture on the left foot s/p surgery  History ofrestless leg at nighttime. He reports having a limited range of motion of his left shoulder, unable to raise his left arm above his shoulder height. Denies any trauma to the left arm  Previous lab work reviewed with him showing hemoglobin A1c 5.7, total cholesterol 144  Previous visit for cortisone shot for his back was put on hold secondary to ectopy .  In followup in our clinic, 30 day Holter monitor was ordered.This has shown frequent PVCs, occasional couplets, triplets. Very rare episodes of dizziness.   PMH:   has a past medical history of Anxiety; Arthritis; Chronic diastolic CHF (congestive heart failure) ([place]); DDD (degenerative disc disease), cervical; Depressive disorder, not elsewhere classified; Depressive disorder, not elsewhere classified; Difficult intubation; DVT (deep venous thrombosis) ([place]); Dysphagia, oral phase; Dyspnea; Edema; History of kidney stones; Hypertension; Hypoxemia; Impacted cerumen;  Mixed hyperlipidemia; Nonunion of foot fracture; Other myelopathy; Pain in limb; Palpitations; Paroxysmal Atrial Fibrillation ([place]); Pneumonia (03/06/2003); Spinal stenosis, unspecified region other than cervical; Squamous cell carcinoma of skin of trunk, except scrotum; and Thoracic or lumbosacral neuritis or radiculitis, unspecified.  PSH:    Past  Surgical History:  Procedure Laterality Date  . CARDIOVERSION N/A 06/20/2017   Procedure: CARDIOVERSION;  Surgeon: [name], MD;  Location: [facility] ORS;  Service: Cardiovascular;  Laterality: N/A;  . CATARACT EXTRACTION W/ INTRAOCULAR LENS  IMPLANT, BILATERAL    . CERVICAL FUSION  02/10/2011  . history of abd ultrasound  11/01   fatty liver  . MULTIPLE TOOTH EXTRACTIONS    . ORIF FIBULA FRACTURE Left 01/06/2017   Procedure: OPEN REDUCTION INTERNAL FIXATION (ORIF) FIBULA FRACTURE DISTAL FIBULA;  Surgeon: Melrose Nakayama, MD;  Location: [place];  Service: Orthopedics;  Laterality: Left;  Patient states has problems if he will have a tube in throat for Genera; Anesthesia    Current Outpatient Prescriptions  Medication Sig Dispense Refill  . amiodarone (PACERONE) 200 MG tablet Take 1 tablet (200 mg total) by mouth 2 (two) times daily. 70 tablet 3  . apixaban (ELIQUIS) 5 MG TABS tablet Take 1 tablet (5 mg total) by mouth 2 (two) times daily. 60 tablet 6  . benzonatate (TESSALON) 200 MG capsule Take 200 mg by mouth 3 (three) times daily as needed.  0  . cephALEXin (KEFLEX) 500 MG capsule Take 500 mg by mouth 3 (three) times daily.  0  . Cholecalciferol (VITAMIN D) 1000 UNITS capsule Take one tablet once daily 90 capsule 3  . furosemide (LASIX) 20 MG tablet Take 2 tablets (40 mg total) by mouth 2 (two) times daily. 60 tablet 3  . gabapentin (NEURONTIN) 600 MG tablet Take 1 tablet (600 mg total) by mouth 3 (three) times daily. For nerve pain (Patient taking differently: Take 600 mg by mouth 3 (three) times daily as needed (for nerve pain). For nerve pain) 270 tablet 0  . metoprolol tartrate (LOPRESSOR) 100 MG tablet Take one tablet by mouth twice daily for heart (Patient taking differently: Take 100 mg by mouth 2 (two) times daily. ) 180 tablet 3  . potassium chloride SA (K-DUR,KLOR-CON) 20 MEQ tablet Take 1 tablet (20 mEq total) by mouth daily. 90 tablet 3  . pravastatin (PRAVACHOL) 40 MG tablet Take  1 tablet (40 mg total) by mouth daily. (Patient taking differently: Take 40 mg by mouth at bedtime. ) 90 tablet 3  . promethazine (PHENERGAN) 25 MG tablet Take 25 mg by mouth every 6 (six) hours as needed.    . traMADol (ULTRAM) 50 MG tablet Take 1 tablet (50 mg total) by mouth every 6 (six) hours as needed for moderate pain or severe pain. For Back Pain 30 tablet 0   No current facility-administered medications for this visit.      Allergies:   Morphine and related; Percocet [oxycodone-acetaminophen]; and Valium   Social History:  The patient  reports that he has never smoked. He has never used smokeless tobacco. He reports that he does not drink alcohol or use drugs.   Family History:   family history includes Atrial fibrillation in his brother; Cancer in his other; Heart disease in his brother; Stroke in his father and paternal grandfather.    Review of Systems: Review of Systems  Constitutional: Negative.   Respiratory: Positive for shortness of breath.   Cardiovascular: Positive for leg swelling.  Gastrointestinal: Negative.   Musculoskeletal:  Negative.        Gait instability, decreased range of motion left arm  Neurological: Negative.   Psychiatric/Behavioral: Negative.   All other systems reviewed and are negative.    PHYSICAL EXAM: VS:  BP 120/64 (BP Location: Right Arm, Patient Position: Sitting, Cuff Size: Normal)   Ht 5\' 6"  (1.676 m)   Wt 178 lb 8 oz (81 kg)   BMI 28.81 kg/m  , BMI Body mass index is 28.81 kg/m. GEN: Well nourished, well developed, in no acute distress ,  presents in a Wheelchair HEENT: normal  Neck: no JVD, carotid bruits, or masses Cardiac:  Irregularly irregular, no murmurs, rubs, or gallops,  Trace pitting edema  bilateral lower extremities Respiratory:  Dullness at the bases left greater than right, normal work of breathing GI: soft, nontender, nondistended, + BS MS: no deformity or atrophy  Skin: warm and dry, no rash Neuro:  Strength and  sensation are intact Psych: euthymic mood, full affect    Recent Labs: 03/29/2017: TSH 3.340 06/16/2017: Hemoglobin 15.1; Platelets 205 06/30/2017: BUN 38; Creatinine, Ser 2.27; Potassium 5.0; Sodium 137    Lipid Panel Lab Results  Component Value Date   CHOL 169 05/16/2016   HDL 37 (L) 05/16/2016   LDLCALC 90 05/16/2016   TRIG 210 (H) 05/16/2016      Wt Readings from Last 3 Encounters:  06/30/17 178 lb 8 oz (81 kg)  06/01/17 177 lb (80.3 kg)  05/25/17 175 lb (79.4 kg)       ASSESSMENT AND PLAN:  PAF (paroxysmal atrial fibrillation) (HCC) - Plan: EKG 12-Lead Maintaining normal sinus rhythm, converted on amiodarone We'll decrease the amiodarone down to 200 mg daily, stay on anticoagulation  Chronic diastolic CHF (congestive heart failure) ([place]) - Plan: EKG 12-Lead He is taking Lasix 40 twice a day  Leg swelling likely secondary to dependent edema, not fluid overload  Recommended he start moving as he is sitting all day  Also suggested he wear compression hose but he reports he cannot put these on  Regardless swelling is minimal on today's visit  BMP ordered to the hospital so we can address this today before the weekend   Essential hypertension - Plan: EKG 12-Lead Blood pressure is well controlled on today's visit. No changes made to the medications.  Hyperlipidemia Continue pravastatin  Lower extremity edema Back to his baseline, trace edema, recommended compression hose and leg elevation, movement around the house  CAD seen on CT scan 2012 Currently with no symptoms of angina. No further workup at this time. Continue current medication regimen.  Weakness High fall risk, recommended he restart his PT  Disposition:   F/U  6 months  Long discussion with family concerning above  Total encounter time more than 45 minutes  Greater than 50% was spent in counseling and coordination of care with the patient    Orders Placed This Encounter  Procedures  . Basic  Metabolic Panel (BMET)  . EKG 12-Lead     Signed, Esmond Plants, M.D., Ph.D. 06/30/2017  [place], [place]

## 2017-06-30 ENCOUNTER — Ambulatory Visit (INDEPENDENT_AMBULATORY_CARE_PROVIDER_SITE_OTHER): Payer: PPO | Admitting: Cardiovascular Disease

## 2017-06-30 ENCOUNTER — Other Ambulatory Visit
Admission: RE | Admit: 2017-06-30 | Discharge: 2017-06-30 | Disposition: A | Discharge status: Home / Self Care | Payer: PPO | Source: Ambulatory Visit | Attending: Cardiovascular Disease | Admitting: Cardiovascular Disease

## 2017-06-30 ENCOUNTER — Encounter: Payer: Self-pay | Admitting: Cardiovascular Disease

## 2017-06-30 VITALS — BP 120/64 | Ht 66.0 in | Wt 178.5 lb

## 2017-06-30 DIAGNOSIS — I480 Paroxysmal atrial fibrillation: Secondary | ICD-10-CM

## 2017-06-30 DIAGNOSIS — I5032 Chronic diastolic (congestive) heart failure: Secondary | ICD-10-CM | POA: Diagnosis not present

## 2017-06-30 DIAGNOSIS — I10 Essential (primary) hypertension: Secondary | ICD-10-CM

## 2017-06-30 DIAGNOSIS — E782 Mixed hyperlipidemia: Secondary | ICD-10-CM

## 2017-06-30 DIAGNOSIS — I4892 Unspecified atrial flutter: Secondary | ICD-10-CM | POA: Diagnosis not present

## 2017-06-30 DIAGNOSIS — Z01818 Encounter for other preprocedural examination: Secondary | ICD-10-CM | POA: Insufficient documentation

## 2017-06-30 DIAGNOSIS — R600 Localized edema: Secondary | ICD-10-CM | POA: Diagnosis not present

## 2017-06-30 LAB — BASIC METABOLIC PANEL
ANION GAP: 8 (ref 5–15)
BUN: 38 mg/dL — ABNORMAL HIGH (ref 6–20)
CALCIUM: 9.1 mg/dL (ref 8.9–10.3)
CO2: 28 mmol/L (ref 22–32)
CREATININE: 2.27 mg/dL — AB (ref 0.61–1.24)
Chloride: 101 mmol/L (ref 101–111)
GFR, EST AFRICAN AMERICAN: 29 mL/min — AB (ref >60)
GFR, EST NON AFRICAN AMERICAN: 25 mL/min — AB (ref >60)
GLUCOSE: 95 mg/dL (ref 65–99)
Potassium: 5 mmol/L (ref 3.5–5.1)
Sodium: 137 mmol/L (ref 135–145)

## 2017-06-30 NOTE — Patient Instructions (Addendum)
We will ask for home PT assessment ([facility] to restart) We will call Dr. Mariea Clonts to restart home PT  Medication Instructions:   Please decrease the amiodarone down to one a day  Labwork:  BMP today in lobby of hospital  Testing/Procedures:  No further testing at this time   Follow-Up: It was a pleasure seeing you in the office today. Please call us if you have new issues that need to be addressed before your next appt.  [phone]  Your physician wants you to follow-up in: 6 months.  You will receive a reminder letter in the mail two months in advance. If you don't receive a letter, please call our office to schedule the follow-up appointment.  If you need a refill on your cardiac medications before your next appointment, please call your pharmacy.

## 2017-07-03 ENCOUNTER — Telehealth: Payer: Self-pay | Admitting: Cardiovascular Disease

## 2017-07-03 NOTE — Telephone Encounter (Signed)
Pt daughter would like lab results. States she was to get these on Friday. Please call.

## 2017-07-04 NOTE — Telephone Encounter (Signed)
Creatinine has climbed consistent with prerenal state Patient aware, hold Lasix for 3 days then restart Lasix 40 mg daily, down from twice a day

## 2017-07-11 NOTE — Progress Notes (Signed)
07/12/2017 8:42 PM   Eric [name] May 21, 1932 [id number]  Referring provider: [name], DO [place], [place] [address]  Chief Complaint  Patient presents with  . New Patient (Initial Visit)    kidney stone referred by [facility] urgent care    HPI: Patient is a 83 -year-old Caucasian male who presents today as a referral from [facility] for microscopic hematuria and left flank pain.    Patient was found to have microscopic hematuria on 06/23/2017 with 10-50 RBC's/hpf.  Patient doesn't have a prior history of microscopic hematuria.    He does not have a prior history of recurrent urinary tract infections, trauma to the genitourinary tract, BPH or malignancies of the genitourinary tract.   He does have a history of nephrolithiasis.    He believes he [place] have passed a stone.  He states he had seen blood in his urine associated with left flank pain.  The pain radiated to his left waist and into his groin.  He states that the pain lasted for one week.  Then the pain abated and he had no further pain or hematuria since.    He does not have a family medical history of nephrolithiasis, malignancies of the genitourinary tract or hematuria.    Today, he is having symptoms of frequent urination and incontinence.  His UA today is negative.    He is not experiencing any suprapubic pain, abdominal pain or flank pain. He denies any recent fevers, chills, nausea or vomiting.     He has not had any recent imaging studies.   He is a former smoker, with a 1 ppd history.  Quit many years ago.  He is not exposed to secondhand smoke.  He has not  worked with [employer], trichloroethylene, etc.   He has HTN.    PMH: Past Medical History:  Diagnosis Date  . Anxiety   . Arthritis   . Chronic diastolic CHF (congestive heart failure) ([place])    a. 01/2011 Echo: EF 50-55%, gr1 DD, mild AI, nl RV fxn, mild TR/PR.  . DDD (degenerative disc disease), cervical   .  Depressive disorder, not elsewhere classified   . Depressive disorder, not elsewhere classified   . Difficult intubation   . DVT (deep venous thrombosis) ([place])   . Dysphagia, oral phase   . Dyspnea   . Edema   . History of kidney stones   . Hypertension   . Hypoxemia   . Impacted cerumen   . Mixed hyperlipidemia   . 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 Other myelopathy   . Pain in limb   . Palpitations   . Paroxysmal Atrial Fibrillation ([place])    a. a. 01/2011 in setting of post-op complications including aspiration pna;  b. CHA2DS2VASc = 4.  . Pneumonia 03/06/2003  . Spinal stenosis, unspecified region other than cervical   . 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 Thoracic or lumbosacral neuritis or radiculitis, unspecified     Surgical History: Past Surgical History:  Procedure Laterality Date  . CARDIOVERSION N/A 06/20/2017   Procedure: CARDIOVERSION;  Surgeon: [name], MD;  Location: [facility] ORS;  Service: Cardiovascular;  Laterality: N/A;  . CATARACT EXTRACTION W/ INTRAOCULAR LENS  IMPLANT, BILATERAL    . CERVICAL FUSION  02/10/2011  . history of abd ultrasound  11/01   fatty liver  .  MULTIPLE TOOTH EXTRACTIONS    . ORIF FIBULA FRACTURE Left 01/06/2017   Procedure: OPEN REDUCTION INTERNAL FIXATION (ORIF) FIBULA FRACTURE DISTAL FIBULA;  Surgeon: Melrose Nakayama, MD;  Location: [facility];  Service: Orthopedics;  Laterality: Left;  Patient states has problems if he will have a tube in throat for [place]; Anesthesia    Home Medications:  Allergies as of 07/12/2017      Reactions   Morphine And Related Shortness Of Breath   Percocet [oxycodone-acetaminophen] Shortness Of Breath   Valium Shortness Of Breath      Medication List       Accurate as of 07/12/17 11:59 PM. Always use your most recent med list.          amiodarone 200 MG tablet Commonly known as:  PACERONE Take 1 tablet (200 mg total) by  mouth 2 (two) times daily.   apixaban 5 MG Tabs tablet Commonly known as:  ELIQUIS Take 1 tablet (5 mg total) by mouth 2 (two) times daily.   benzonatate 200 MG capsule Commonly known as:  TESSALON Take 200 mg by mouth 3 (three) times daily as needed.   cephALEXin 500 MG capsule Commonly known as:  KEFLEX Take 500 mg by mouth 3 (three) times daily.   furosemide 20 MG tablet Commonly known as:  LASIX Take 2 tablets (40 mg total) by mouth 2 (two) times daily.   gabapentin 600 MG tablet Commonly known as:  NEURONTIN Take 1 tablet (600 mg total) by mouth 3 (three) times daily. For nerve pain   metoprolol tartrate 100 MG tablet Commonly known as:  LOPRESSOR Take one tablet by mouth twice daily for heart   potassium chloride SA 20 MEQ tablet Commonly known as:  K-DUR,KLOR-CON Take 1 tablet (20 mEq total) by mouth daily.   pravastatin 40 MG tablet Commonly known as:  PRAVACHOL Take 1 tablet (40 mg total) by mouth daily.   promethazine 25 MG tablet Commonly known as:  PHENERGAN Take 25 mg by mouth every 6 (six) hours as needed.   traMADol 50 MG tablet Commonly known as:  ULTRAM Take 1 tablet (50 mg total) by mouth every 6 (six) hours as needed for moderate pain or severe pain. For Back Pain   Vitamin D 1000 units capsule Take one tablet once daily       Allergies:  Allergies  Allergen Reactions  . Morphine And Related Shortness Of Breath  . Percocet [Oxycodone-Acetaminophen] Shortness Of Breath  . Valium Shortness Of Breath    Family History: Family History  Problem Relation Age of Onset  . Stroke Father   . Atrial fibrillation Brother        on coumadin  . Heart disease Brother        AFib- coumadin  . Stroke Paternal Grandfather   . Cancer Other        colon cancer at early age  . Prostate cancer Neg Hx   . Kidney cancer Neg Hx   . Bladder Cancer Neg Hx     Social History:  reports that he has quit smoking. He quit after 1.00 year of use. He has never  used smokeless tobacco. He reports that he does not drink alcohol or use drugs.  ROS: UROLOGY Frequent Urination?: Yes Hard to postpone urination?: No Burning/pain with urination?: No Get up at night to urinate?: No Leakage of urine?: Yes Urine stream starts and stops?: No Trouble starting stream?: No Do you have to strain to urinate?: No Blood in  urine?: No Urinary tract infection?: No Sexually transmitted disease?: No Injury to kidneys or bladder?: No Painful intercourse?: No Weak stream?: No Erection problems?: No Penile pain?: No  Gastrointestinal Nausea?: No Vomiting?: No Indigestion/heartburn?: No Diarrhea?: No Constipation?: No  Constitutional Fever: No Night sweats?: No Weight loss?: No Fatigue?: No  Skin Skin rash/lesions?: No Itching?: No  Eyes Blurred vision?: No Double vision?: No  Ears/Nose/Throat Sore throat?: No Sinus problems?: No  Hematologic/Lymphatic Swollen glands?: No Easy bruising?: No  Cardiovascular Leg swelling?: No Chest pain?: No  Respiratory Cough?: No Shortness of breath?: No  Endocrine Excessive thirst?: No  Musculoskeletal Back pain?: No Joint pain?: No  Neurological Headaches?: No Dizziness?: No  Psychologic Depression?: No Anxiety?: No  Physical Exam: BP (!) 143/73   Pulse 71   Ht 5\' 6"  (1.676 m)   Wt 174 lb 4.8 oz (79.1 kg)   BMI 28.13 kg/m   Constitutional: Well nourished. Alert and oriented, No acute distress. HEENT: [place] AT, moist mucus membranes. Trachea midline, no masses. Cardiovascular: No clubbing, cyanosis, or edema. Respiratory: Normal respiratory effort, no increased work of breathing. GI: Abdomen is soft, non tender, non distended, no abdominal masses. Liver and spleen not palpable.  No hernias appreciated.  Stool sample for occult testing is not indicated.   GU: No CVA tenderness.  No bladder fullness or masses.  Patient with circumcised  phallus.  Urethral meatus is patent.  No penile  discharge. No penile lesions or rashes. Scrotum without lesions, cysts, rashes and/or edema.  Testicles are located scrotally bilaterally. No masses are appreciated in the testicles. Left and right epididymis are normal. Rectal: Patient with  normal sphincter tone. Anus and perineum without scarring or rashes. No rectal masses are appreciated. Prostate is approximately 50  grams, no nodules are appreciated. Seminal vesicles are normal. Skin: No rashes, bruises or suspicious lesions. Lymph: No cervical or inguinal adenopathy. Neurologic: Grossly intact, no focal deficits, moving all 4 extremities. Psychiatric: Normal mood and affect.  Laboratory Data: Lab Results  Component Value Date   WBC 6.1 06/16/2017   HGB 15.1 06/16/2017   HCT 44.2 06/16/2017   MCV 94.5 06/16/2017   PLT 205 06/16/2017    Lab Results  Component Value Date   CREATININE 1.34 (H) 07/12/2017    Lab Results  Component Value Date   PSA 0.78 11/29/2010   PSA 0.54 07/09/2009   PSA 0.46 06/02/2008    Lab Results  Component Value Date   TSH 3.340 03/29/2017   I have reviewed the labs  Urinalysis Unremarkable.  See EPIC.    Assessment & Plan:    1. Microscopic hematuria  - I explained to the patient that there are a number of causes that can be associated with blood in the urine, such as stones, BPH, UTI's, damage to the urinary tract and/or cancer - he is pretty sure it was due to a stone  - UA - negative -RTC in 2 weeks for UA recheck  2. Left flank pain  - see above  Return in about 2 weeks (around 07/26/2017) for UA.  These notes generated with voice recognition software. I apologize for typographical errors.  Zara Council, [facility] [address] [place], [place] [address] [phone]

## 2017-07-12 ENCOUNTER — Encounter: Payer: Self-pay | Admitting: Urology

## 2017-07-12 ENCOUNTER — Telehealth: Payer: Self-pay | Admitting: Cardiovascular Disease

## 2017-07-12 ENCOUNTER — Telehealth: Payer: Self-pay | Admitting: Urology

## 2017-07-12 ENCOUNTER — Ambulatory Visit (INDEPENDENT_AMBULATORY_CARE_PROVIDER_SITE_OTHER): Payer: PPO | Admitting: Urology

## 2017-07-12 VITALS — BP 143/73 | HR 71 | Ht 66.0 in | Wt 174.3 lb

## 2017-07-12 DIAGNOSIS — R3129 Other microscopic hematuria: Secondary | ICD-10-CM | POA: Diagnosis not present

## 2017-07-12 DIAGNOSIS — I10 Essential (primary) hypertension: Secondary | ICD-10-CM

## 2017-07-12 DIAGNOSIS — R109 Unspecified abdominal pain: Secondary | ICD-10-CM

## 2017-07-12 DIAGNOSIS — I5032 Chronic diastolic (congestive) heart failure: Secondary | ICD-10-CM

## 2017-07-12 NOTE — Telephone Encounter (Signed)
S/w Jenny Reichmann, patient's daughter, ok per dpr. She was unsure when patient was to get repeat labs. Per lab result from 06/30/17 patient is to get lab work in 1 month. She verbalized understanding to go to [facility] with patient for BMP sometime the last week of August. She said that patient has not drank soda in 3 days and has had no more swelling. Advised the best thing to drink is water and tea/coffee at a minimal. She verbalized understanding. BMP order entered.

## 2017-07-12 NOTE — Telephone Encounter (Signed)
Please let Eric [name] know that his urine did not have blood.  It is likely he did pass a stone, but cancer can cause blood in the urine.  We would need to have the patient undergo a CTU and cystoscopy for further evaluation.  Due to his age and clinical picture, I am willing to recheck the urine again in two weeks to make sure the blood does not return.

## 2017-07-12 NOTE — Telephone Encounter (Signed)
Patient daughter cindy came in to ask about repeat kidney function lab.  Please call to advise.    Patient stopped drinking soda and his swelling has decreased.  Sh wants to know what drinks do not contain any Na+

## 2017-07-13 LAB — URINALYSIS, COMPLETE
Bilirubin, UA: NEGATIVE
GLUCOSE, UA: NEGATIVE
Leukocytes, UA: NEGATIVE
NITRITE UA: NEGATIVE
RBC, UA: NEGATIVE
SPEC GRAV UA: 1.025 (ref 1.005–1.030)
UUROB: 0.2 mg/dL (ref 0.2–1.0)
pH, UA: 5.5 (ref 5.0–7.5)

## 2017-07-13 LAB — BUN+CREAT
BUN / CREAT RATIO: 17 (ref 10–24)
BUN: 23 mg/dL (ref 8–27)
Creatinine, Ser: 1.34 mg/dL — ABNORMAL HIGH (ref 0.76–1.27)
GFR calc non Af Amer: 49 mL/min/{1.73_m2} — ABNORMAL LOW (ref >59)
GFR, EST AFRICAN AMERICAN: 56 mL/min/{1.73_m2} — AB (ref >59)

## 2017-07-13 NOTE — Telephone Encounter (Signed)
Spoke with pt in reference to hematuria. Made pt aware can have hematuria work up or recheck urine in 2 weeks. Pt elected to recheck urine in 2 weeks. Lab appt made and orders placed.

## 2017-07-15 LAB — CULTURE, URINE COMPREHENSIVE

## 2017-07-17 ENCOUNTER — Telehealth: Payer: Self-pay | Admitting: *Deleted

## 2017-07-17 NOTE — Telephone Encounter (Signed)
Let patient know culture was negative.

## 2017-07-21 DIAGNOSIS — M25572 Pain in left ankle and joints of left foot: Secondary | ICD-10-CM | POA: Diagnosis not present

## 2017-07-27 ENCOUNTER — Other Ambulatory Visit
Admission: RE | Admit: 2017-07-27 | Discharge: 2017-07-27 | Disposition: A | Discharge status: Home / Self Care | Payer: PPO | Source: Ambulatory Visit | Attending: Cardiovascular Disease | Admitting: Cardiovascular Disease

## 2017-07-27 ENCOUNTER — Other Ambulatory Visit: Payer: PPO

## 2017-07-27 DIAGNOSIS — R3129 Other microscopic hematuria: Secondary | ICD-10-CM | POA: Diagnosis not present

## 2017-07-27 DIAGNOSIS — I10 Essential (primary) hypertension: Secondary | ICD-10-CM | POA: Insufficient documentation

## 2017-07-27 DIAGNOSIS — I5032 Chronic diastolic (congestive) heart failure: Secondary | ICD-10-CM | POA: Insufficient documentation

## 2017-07-27 LAB — BASIC METABOLIC PANEL
Anion gap: 8 (ref 5–15)
BUN: 33 mg/dL — AB (ref 6–20)
CALCIUM: 9.6 mg/dL (ref 8.9–10.3)
CO2: 27 mmol/L (ref 22–32)
CREATININE: 1.44 mg/dL — AB (ref 0.61–1.24)
Chloride: 106 mmol/L (ref 101–111)
GFR calc Af Amer: 50 mL/min — ABNORMAL LOW (ref >60)
GFR, EST NON AFRICAN AMERICAN: 43 mL/min — AB (ref >60)
GLUCOSE: 90 mg/dL (ref 65–99)
Potassium: 4.6 mmol/L (ref 3.5–5.1)
SODIUM: 141 mmol/L (ref 135–145)

## 2017-07-27 LAB — URINALYSIS, COMPLETE
BILIRUBIN UA: NEGATIVE
Glucose, UA: NEGATIVE
Ketones, UA: NEGATIVE
Leukocytes, UA: NEGATIVE
NITRITE UA: NEGATIVE
PH UA: 5.5 (ref 5.0–7.5)
Protein, UA: NEGATIVE
Specific Gravity, UA: 1.02 (ref 1.005–1.030)
UUROB: 0.2 mg/dL (ref 0.2–1.0)

## 2017-08-01 DIAGNOSIS — H25813 Combined forms of age-related cataract, bilateral: Secondary | ICD-10-CM | POA: Diagnosis not present

## 2017-08-16 ENCOUNTER — Telehealth: Payer: Self-pay | Admitting: Cardiovascular Disease

## 2017-08-16 DIAGNOSIS — I4819 Other persistent atrial fibrillation: Secondary | ICD-10-CM

## 2017-08-16 NOTE — Telephone Encounter (Signed)
Patient states that he has some skipped beats off and on. He verified all medications and states that it happens a few times during the day. Nothing continuous and no other symptoms. Patient would like to see if he could see Dr. Rockey Situ on Friday around 11 am. Let him know that we have no openings available but that we might be able to have him come in for a nurse visit to have EKG done to see if he is out of rhythm. He was agreeable with this plan and had no further questions at this time.

## 2017-08-16 NOTE — Telephone Encounter (Signed)
Patient states that he has this cough which started way before his heart was out of rhythm. Confirmed his appointment for Friday for EKG check here in our office and he verbalized understanding with no further questions at this time.

## 2017-08-16 NOTE — Telephone Encounter (Signed)
Pt states he has a cough which makes his throat hurt. He states his :PCP tells him this is heart related. Please call.

## 2017-08-16 NOTE — Telephone Encounter (Signed)
Pt states she thinks her heart is out of rhythm. States it feels as if it is "skipping". States she is not having any other symptoms. Please call.

## 2017-08-17 NOTE — Telephone Encounter (Signed)
We can order CXR Has he seen PMD? Could it be bronchitis? Could also order BNP in hospital lobby. If BNP low/CXR clear, and in NSR, would likely be lung issue.

## 2017-08-17 NOTE — Telephone Encounter (Signed)
Patients daughter is very upset that her father still has this cough with no relief and nobody has taken care of this. She is wanting something for his cough. Reviewed with her some over the counter options such as lozenges and she states that he has tried that with continued coughing. She states that she is very concerned and very upset that he still has this cough. Let her know that we scheduled him to come in tomorrow for EKG and if that is normal rhythm then we could possibly put him in for pulmonary referral if that EKG is normal. She was agreeable with this plan and requested that I call her back on her cell phone number [phone] and leave a detailed message tomorrow with results of EKG and referral. Let her know that I would discuss this with Dr. Rockey Situ as well and would be in touch. She was appreciative for our conversation (35 minutes) and had no further questions at this time.

## 2017-08-17 NOTE — Telephone Encounter (Signed)
Pt saughter called, stated pt would like for Dr. Rockey Situ to give him something for his cough. She states she would lke to have this taken care of, and she did not want "to lose my dad". Please call. She ask we call her before 1:55 pm

## 2017-08-18 ENCOUNTER — Ambulatory Visit (INDEPENDENT_AMBULATORY_CARE_PROVIDER_SITE_OTHER): Payer: PPO | Admitting: *Deleted

## 2017-08-18 ENCOUNTER — Other Ambulatory Visit
Admission: RE | Admit: 2017-08-18 | Discharge: 2017-08-18 | Disposition: A | Discharge status: Home / Self Care | Payer: PPO | Source: Ambulatory Visit | Attending: Cardiovascular Disease | Admitting: Cardiovascular Disease

## 2017-08-18 ENCOUNTER — Ambulatory Visit
Admission: RE | Admit: 2017-08-18 | Discharge: 2017-08-18 | Disposition: A | Discharge status: Home / Self Care | Payer: PPO | Source: Ambulatory Visit | Attending: Cardiovascular Disease | Admitting: Cardiovascular Disease

## 2017-08-18 VITALS — BP 136/80 | HR 56 | Ht 66.0 in | Wt 178.5 lb

## 2017-08-18 DIAGNOSIS — R05 Cough: Secondary | ICD-10-CM | POA: Diagnosis not present

## 2017-08-18 DIAGNOSIS — I10 Essential (primary) hypertension: Secondary | ICD-10-CM | POA: Diagnosis not present

## 2017-08-18 DIAGNOSIS — I4819 Other persistent atrial fibrillation: Secondary | ICD-10-CM

## 2017-08-18 DIAGNOSIS — I481 Persistent atrial fibrillation: Secondary | ICD-10-CM | POA: Diagnosis not present

## 2017-08-18 DIAGNOSIS — J9811 Atelectasis: Secondary | ICD-10-CM | POA: Diagnosis not present

## 2017-08-18 LAB — BRAIN NATRIURETIC PEPTIDE: B NATRIURETIC PEPTIDE 5: 91 pg/mL (ref 0.0–100.0)

## 2017-08-18 NOTE — Telephone Encounter (Signed)
Reviewed preliminary results and recommendations with patient and advised him to take over the counter cough medications to see if that will provide some relief. Also advised him to contact PCP as well and placed referral in to pulmonary for evaluation. He verbalized understanding of all information and had no further questions at this time.

## 2017-08-18 NOTE — Addendum Note (Signed)
Addended by: Valora Corporal on: 08/18/2017 05:10 PM   Modules accepted: Orders

## 2017-08-18 NOTE — Telephone Encounter (Signed)
Patient came for nurse visit. See nurse visit notes.

## 2017-08-18 NOTE — Telephone Encounter (Signed)
Spoke with patients daughter per release form and reviewed preliminary findings of testing and let her know that I would put in a pulmonary referral. She requested that his appointment to be on a Thursday around 10 AM so that she can come with patient. Let her know that I would pass that information on and I would also call her father with results. She was appreciative for the call and had no further questions at this time.

## 2017-08-18 NOTE — Progress Notes (Signed)
1.) Reason for visit: EKG check  2.) Name of MD requesting visit: Dr Rockey Situ  3.) H&P: s/p DCCV, HTN, Atrial fib  4.) ROS related to problem: Patient presents today with complaint of cough. Cough is causing his throat and upper chest area to hurt. ALso, feels his heart is "skipping beats" at times and shortness of breath on exertion. EKG performed.  5.) Assessment and plan per MD: Dr Rockey Situ presented with EKG and patient complaints. He advised for patient to have CXR and BNP lab work today. Patient should also contact his PCP regarding none cardiac causes for cough as patient's EKG is benign.  Patient and wife verbalized understanding of recommendations and are going to [facility] now.

## 2017-08-24 ENCOUNTER — Ambulatory Visit: Payer: Self-pay | Admitting: Nurse Practitioner

## 2017-08-31 ENCOUNTER — Ambulatory Visit: Payer: Self-pay | Admitting: Nurse Practitioner

## 2017-09-01 ENCOUNTER — Ambulatory Visit (INDEPENDENT_AMBULATORY_CARE_PROVIDER_SITE_OTHER): Payer: PPO | Admitting: Nurse Practitioner

## 2017-09-01 ENCOUNTER — Encounter: Payer: Self-pay | Admitting: Nurse Practitioner

## 2017-09-01 VITALS — BP 128/70 | HR 60 | Temp 97.6°F | Wt 180.0 lb

## 2017-09-01 DIAGNOSIS — Z23 Encounter for immunization: Secondary | ICD-10-CM | POA: Diagnosis not present

## 2017-09-01 DIAGNOSIS — K219 Gastro-esophageal reflux disease without esophagitis: Secondary | ICD-10-CM

## 2017-09-01 DIAGNOSIS — E782 Mixed hyperlipidemia: Secondary | ICD-10-CM

## 2017-09-01 DIAGNOSIS — I10 Essential (primary) hypertension: Secondary | ICD-10-CM

## 2017-09-01 DIAGNOSIS — I5032 Chronic diastolic (congestive) heart failure: Secondary | ICD-10-CM | POA: Diagnosis not present

## 2017-09-01 DIAGNOSIS — R059 Cough, unspecified: Secondary | ICD-10-CM

## 2017-09-01 DIAGNOSIS — R05 Cough: Secondary | ICD-10-CM | POA: Diagnosis not present

## 2017-09-01 LAB — HEPATIC FUNCTION PANEL
AG RATIO: 1.6 (calc) (ref 1.0–2.5)
ALKALINE PHOSPHATASE (APISO): 45 U/L (ref 40–115)
ALT: 39 U/L (ref 9–46)
AST: 36 U/L — ABNORMAL HIGH (ref 10–35)
Albumin: 4.3 g/dL (ref 3.6–5.1)
BILIRUBIN DIRECT: 0.1 mg/dL (ref 0.0–0.2)
BILIRUBIN INDIRECT: 0.6 mg/dL (ref 0.2–1.2)
BILIRUBIN TOTAL: 0.7 mg/dL (ref 0.2–1.2)
GLOBULIN: 2.7 g/dL (ref 1.9–3.7)
TOTAL PROTEIN: 7 g/dL (ref 6.1–8.1)

## 2017-09-01 LAB — LIPID PANEL
CHOLESTEROL: 187 mg/dL (ref <200)
HDL: 34 mg/dL — AB (ref >40)
LDL CHOLESTEROL (CALC): 113 mg/dL — AB
Non-HDL Cholesterol (Calc): 153 mg/dL (calc) — ABNORMAL HIGH (ref <130)
TRIGLYCERIDES: 303 mg/dL — AB (ref <150)
Total CHOL/HDL Ratio: 5.5 (calc) — ABNORMAL HIGH (ref <5.0)

## 2017-09-01 MED ORDER — PRAVASTATIN SODIUM 40 MG PO TABS: 40.0000 mg | ORAL_TABLET | Freq: Every day | ORAL | 3 refills | Status: DC

## 2017-09-01 MED ORDER — PANTOPRAZOLE SODIUM 40 MG PO TBEC: 40.0000 mg | DELAYED_RELEASE_TABLET | Freq: Every day | ORAL | 3 refills | Status: DC

## 2017-09-01 NOTE — Progress Notes (Signed)
Careteam: Patient Care Team: [name], DO as PCP - General (Geriatric Medicine) [name], MD as Consulting Physician (Cardiology) Normajean Glasgow, MD as Attending Physician (Physical Medicine and Rehabilitation) Phylliss Bob, MD as Consulting Physician (Orthopedic Surgery)  Advanced Directive information    Allergies  Allergen Reactions  . Morphine And Related Shortness Of Breath  . Percocet [Oxycodone-Acetaminophen] Shortness Of Breath  . Valium Shortness Of Breath    Chief Complaint  Patient presents with  . Acute Visit    cough x4 months     HPI: Patient is a 83 y.o. male seen in the office today due to cough for months.  Was ordinally told it was due to his heart hx of a fib but had cardioversion, CHF and  lasix adjusted however cough has persisted. conts to follow up with cardiologist routinely. Weight has been stable. No increase in edema.  Cough is nonproductive. Will cause a sore throat if he coughs a lot.  Does not feel ill/sick. Feels well.  Chest xray done on 08/18/17 - slight bibasilar atelectasis noted.  Occasionally indigestion. Does not take any medication of this. Questions if cough is coming from this.    Review of Systems:  Review of Systems  Constitutional: Negative for chills, fever and malaise/fatigue.  HENT: Positive for hearing loss. Negative for congestion and sore throat.   Eyes: Negative for blurred vision.       Glasses  Respiratory: Positive for cough and shortness of breath (occasionally ).   Cardiovascular: Negative for chest pain, palpitations, orthopnea, claudication, leg swelling and PND.  Gastrointestinal: Positive for heartburn. Negative for abdominal pain, blood in stool, constipation and melena.  Neurological: Negative for weakness.  Endo/Heme/Allergies: Bruises/bleeds easily.    Past Medical History:  Diagnosis Date  . Anxiety   . Arthritis   . Chronic diastolic CHF (congestive heart failure) ([place])    a. 01/2011  Echo: EF 50-55%, gr1 DD, mild AI, nl RV fxn, mild TR/PR.  . DDD (degenerative disc disease), cervical   . Depressive disorder, not elsewhere classified   . Depressive disorder, not elsewhere classified   . Difficult intubation   . DVT (deep venous thrombosis) ([place])   . Dysphagia, oral phase   . Dyspnea   . Edema   . History of kidney stones   . Hypertension   . Hypoxemia   . Impacted cerumen   . Mixed hyperlipidemia   . 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 Other myelopathy   . Pain in limb   . Palpitations   . Paroxysmal Atrial Fibrillation ([place])    a. a. 01/2011 in setting of post-op complications including aspiration pna;  b. CHA2DS2VASc = 4.  . Pneumonia 03/06/2003  . Spinal stenosis, unspecified region other than cervical   . 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 Thoracic or lumbosacral neuritis or radiculitis, unspecified    Past Surgical History:  Procedure Laterality Date  . CARDIOVERSION N/A 06/20/2017   Procedure: CARDIOVERSION;  Surgeon: [name], MD;  Location: [facility] ORS;  Service: Cardiovascular;  Laterality: N/A;  . CATARACT EXTRACTION W/ INTRAOCULAR LENS  IMPLANT, BILATERAL    . CERVICAL FUSION  02/10/2011  . history of abd ultrasound  11/01   fatty liver  . MULTIPLE TOOTH EXTRACTIONS    . ORIF FIBULA FRACTURE Left 01/06/2017   Procedure: OPEN REDUCTION INTERNAL FIXATION (ORIF) FIBULA FRACTURE  DISTAL FIBULA;  Surgeon: Melrose Nakayama, MD;  Location: [facility];  Service: Orthopedics;  Laterality: Left;  Patient states has problems if he will have a tube in throat for Genera; Anesthesia   Social History:   reports that he has quit smoking. He quit after 1.00 year of use. He has never used smokeless tobacco. He reports that he does not drink alcohol or use drugs.  Family History  Problem Relation Age of Onset  . Stroke Father   . Atrial fibrillation Brother        on coumadin  . Heart  disease Brother        AFib- coumadin  . Stroke Paternal Grandfather   . Cancer Other        colon cancer at early age  . Prostate cancer Neg Hx   . Kidney cancer Neg Hx   . Bladder Cancer Neg Hx     Medications: Patient's Medications  New Prescriptions   No medications on file  Previous Medications   AMIODARONE (PACERONE) 200 MG TABLET    Take 1 tablet (200 mg total) by mouth 2 (two) times daily.   APIXABAN (ELIQUIS) 5 MG TABS TABLET    Take 1 tablet (5 mg total) by mouth 2 (two) times daily.   CHOLECALCIFEROL (VITAMIN D) 1000 UNITS CAPSULE    Take one tablet once daily   FUROSEMIDE (LASIX) 20 MG TABLET    Take 2 tablets (40 mg total) by mouth 2 (two) times daily.   GABAPENTIN (NEURONTIN) 600 MG TABLET    Take 1 tablet (600 mg total) by mouth 3 (three) times daily. For nerve pain   METOPROLOL TARTRATE (LOPRESSOR) 100 MG TABLET    Take one tablet by mouth twice daily for heart   POTASSIUM CHLORIDE SA (K-DUR,KLOR-CON) 20 MEQ TABLET    Take 1 tablet (20 mEq total) by mouth daily.  Modified Medications   Modified Medication Previous Medication   PRAVASTATIN (PRAVACHOL) 40 MG TABLET pravastatin (PRAVACHOL) 40 MG tablet      Take 1 tablet (40 mg total) by mouth daily.    Take 1 tablet (40 mg total) by mouth daily.  Discontinued Medications   BENZONATATE (TESSALON) 200 MG CAPSULE    Take 200 mg by mouth 3 (three) times daily as needed.   CEPHALEXIN (KEFLEX) 500 MG CAPSULE    Take 500 mg by mouth 3 (three) times daily.   PROMETHAZINE (PHENERGAN) 25 MG TABLET    Take 25 mg by mouth every 6 (six) hours as needed.   TRAMADOL (ULTRAM) 50 MG TABLET    Take 1 tablet (50 mg total) by mouth every 6 (six) hours as needed for moderate pain or severe pain. For Back Pain     Physical Exam:  Vitals:   09/01/17 0944  BP: 128/70  Pulse: 60  Temp: 97.6 F (36.4 C)  TempSrc: Oral  SpO2: 94%  Weight: 180 lb (81.6 kg)   Body mass index is 29.05 kg/m.  Physical Exam  Constitutional: He is  oriented to person, place, and time. He appears well-developed and well-nourished.  HENT:  HOH  Eyes:  glasses  Neck: Normal range of motion.  Cardiovascular: Normal rate, regular rhythm and normal heart sounds.   Pulmonary/Chest: Effort normal and breath sounds normal. He has no rales.  Abdominal: Soft. Bowel sounds are normal. He exhibits no distension. There is no tenderness.  Musculoskeletal: He exhibits edema (trace bilaterally).  Walks with limp, uses cane, brace on left leg  Neurological:  He is alert and oriented to person, place, and time.  Skin: Skin is warm and dry.  Psychiatric: He has a normal mood and affect.    Labs reviewed: Basic Metabolic Panel:  Recent Labs  03/29/17 1503  06/16/17 0909 06/30/17 1120 07/12/17 1442 07/27/17 1012  NA 140  < > 140 137  --  141  K 4.6  < > 4.9 5.0  --  4.6  CL 104  < > 104 101  --  106  CO2 21  < > 28 28  --  27  GLUCOSE 80  < > 91 95  --  90  BUN 27  < > 28* 38* 23 33*  CREATININE 1.02  < > 1.53* 2.27* 1.34* 1.44*  CALCIUM 9.5  < > 9.8 9.1  --  9.6  TSH 3.340  --   --   --   --   --   < > = values in this interval not displayed. Liver Function Tests: No results for input(s): AST, ALT, ALKPHOS, BILITOT, PROT, ALBUMIN in the last 8760 hours. No results for input(s): LIPASE, AMYLASE in the last 8760 hours. No results for input(s): AMMONIA in the last 8760 hours. CBC:  Recent Labs  03/27/17 1530 03/29/17 1503 06/16/17 0909  WBC 6.9 6.5 6.1  NEUTROABS 3,588  --  3.1  HGB 14.2 14.5 15.1  HCT 42.8 43.4 44.2  MCV 97.3 97 94.5  PLT 207 210 205   Lipid Panel: No results for input(s): CHOL, HDL, LDLCALC, TRIG, CHOLHDL, LDLDIRECT in the last 8760 hours. TSH:  Recent Labs  03/29/17 1503  TSH 3.340   A1C: Lab Results  Component Value Date   HGBA1C 5.7 (H) 05/16/2016     Assessment/Plan 1. Cough -no on ACE, heart failure well controlled, chest xray negative. Will treat for GERD at this time  - pantoprazole  (PROTONIX) 40 MG tablet; Take 1 tablet (40 mg total) by mouth daily.  Dispense: 30 tablet; Refill: 3  2. Gastroesophageal reflux disease without esophagitis -cough could be from GERD, information on diet provided and will start PPI - pantoprazole (PROTONIX) 40 MG tablet; Take 1 tablet (40 mg total) by mouth daily.  Dispense: 30 tablet; Refill: 3  3. Chronic diastolic CHF (congestive heart failure) (HCC) Stable, appears euvolemic. conts on lasix and metoprolol.   4. HYPERTENSION, BENIGN ESSENTIAL Blood pressure stable, conts on lopressor and diet modifications.   5. Mixed hyperlipidemia -refill provided for Pravachol  - Lipid Panel - Hepatic Function Panel  6. Need for immunization against influenza - Flu vaccine HIGH DOSE PF (Fluzone High dose)  Next appt: 3 months with Dr Sharee Holster K. Harle Battiest  [facility] [phone] 8 am - 5 pm) [phone] (after hours)

## 2017-09-01 NOTE — Patient Instructions (Signed)
To start protonix 40 mg by mouth daily    Food Choices for Gastroesophageal Reflux Disease, Adult When you have gastroesophageal reflux disease (GERD), the foods you eat and your eating habits are very important. Choosing the right foods can help ease your discomfort. What guidelines do I need to follow?  Choose fruits, vegetables, whole grains, and low-fat dairy products.  Choose low-fat meat, fish, and poultry.  Limit fats such as oils, salad dressings, butter, nuts, and avocado.  Keep a food diary. This helps you identify foods that cause symptoms.  Avoid foods that cause symptoms. These [place] be different for everyone.  Eat small meals often instead of 3 large meals a day.  Eat your meals slowly, in a place where you are relaxed.  Limit fried foods.  Cook foods using methods other than frying.  Avoid drinking alcohol.  Avoid drinking large amounts of liquids with your meals.  Avoid bending over or lying down until 2-3 hours after eating. What foods are not recommended? These are some foods and drinks that [place] make your symptoms worse: Vegetables Tomatoes. Tomato juice. Tomato and spaghetti sauce. Chili peppers. Onion and garlic. Horseradish. Fruits Oranges, grapefruit, and lemon (fruit and juice). Meats High-fat meats, fish, and poultry. This includes hot dogs, ribs, ham, sausage, salami, and bacon. Dairy Whole milk and chocolate milk. Sour cream. Cream. Butter. Ice cream. Cream cheese. Drinks Coffee and tea. Bubbly (carbonated) drinks or energy drinks. Condiments Hot sauce. Barbecue sauce. Sweets/Desserts Chocolate and cocoa. Donuts. Peppermint and spearmint. Fats and Oils High-fat foods. This includes [place] fries and potato chips. Other Vinegar. Strong spices. This includes black pepper, white pepper, red pepper, cayenne, curry powder, cloves, ginger, and chili powder. The items listed above Ben not be a complete list of foods and drinks to avoid. Contact your  dietitian for more information. This information is not intended to replace advice given to you by your health care provider. Make sure you discuss any questions you have with your health care provider. Document Released: 05/29/2012 Document Revised: 05/05/2016 Document Reviewed: 10/02/2013 Elsevier Interactive Patient Education  2017 [employer].

## 2017-09-15 DIAGNOSIS — M25572 Pain in left ankle and joints of left foot: Secondary | ICD-10-CM | POA: Diagnosis not present

## 2017-09-18 ENCOUNTER — Ambulatory Visit (INDEPENDENT_AMBULATORY_CARE_PROVIDER_SITE_OTHER): Payer: PPO | Admitting: Pulmonary Disease

## 2017-09-18 ENCOUNTER — Encounter: Payer: Self-pay | Admitting: Pulmonary Disease

## 2017-09-18 VITALS — BP 134/72 | HR 61 | Ht 66.0 in | Wt 178.0 lb

## 2017-09-18 DIAGNOSIS — K219 Gastro-esophageal reflux disease without esophagitis: Secondary | ICD-10-CM

## 2017-09-18 DIAGNOSIS — H6123 Impacted cerumen, bilateral: Secondary | ICD-10-CM | POA: Diagnosis not present

## 2017-09-18 DIAGNOSIS — R05 Cough: Secondary | ICD-10-CM | POA: Diagnosis not present

## 2017-09-18 DIAGNOSIS — J310 Chronic rhinitis: Secondary | ICD-10-CM

## 2017-09-18 DIAGNOSIS — R053 Chronic cough: Secondary | ICD-10-CM

## 2017-09-18 MED ORDER — FLUTICASONE PROPIONATE 50 MCG/ACT NA SUSP: 2.0000 | Freq: Every day | NASAL | 10 refills | Status: DC

## 2017-09-18 MED ORDER — PANTOPRAZOLE SODIUM 40 MG PO TBEC: 40.0000 mg | DELAYED_RELEASE_TABLET | Freq: Every day | ORAL | 3 refills | Status: DC

## 2017-09-18 NOTE — Patient Instructions (Addendum)
Change Protonix to at dinnertime. You Baumbach take an extra dose tonight  Flonase nasal inhaler - 2 sprays per nostril once a day  Consider cerumen (ear wax) disimpaction - there are kits that he Plagge purchase at a pharmacy or this Reali be done in a physician's office  Follow-up in 6-8 weeks

## 2017-09-18 NOTE — Progress Notes (Signed)
PULMONARY CONSULT NOTE  Requesting MD/Service: Rockey Situ Date of initial consultation: 09/18/17 Reason for consultation: Chronic cough  PT PROFILE: 83 y.o. male with minimal very remote smoking history referred for evaluation of chronic cough  02/13/11 CT chest: Bibasilar atelectasis. No other significant findings  HPI:  As above. He is referred for "constant cough" of 4 months duration. The cough is nonproductive. It appears to be worse after eating. However, it is present throughout the day and night. He also complains of nasal and sinus congestion. He was started on a proton pump inhibitor 2-3 weeks ago. He thinks that this "Pardi have helped a little". He has chronic atrial fibrillation for which she is on amiodarone. His daughter is concerned that this might be a cause of cough. He smoked minimally as a teenager. He never smoked as an adult. He worked in [employer] fund[employer]. Denies CP, fever, purulent sputum, hemoptysis, LE edema and calf tenderness   Past Medical History:  Diagnosis Date  . Anxiety   . Arthritis   . Chronic diastolic CHF (congestive heart failure) ([place])    a. 01/2011 Echo: EF 50-55%, gr1 DD, mild AI, nl RV fxn, mild TR/PR.  . DDD (degenerative disc disease), cervical   . Depressive disorder, not elsewhere classified   . Depressive disorder, not elsewhere classified   . Difficult intubation   . DVT (deep venous thrombosis) ([place])   . Dysphagia, oral phase   . Dyspnea   . Edema   . History of kidney stones   . Hypertension   . Hypoxemia   . Impacted cerumen   . Mixed hyperlipidemia   . 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 Other myelopathy   . Pain in limb   . Palpitations   . Paroxysmal Atrial Fibrillation ([place])    a. a. 01/2011 in setting of post-op complications including aspiration pna;  b. CHA2DS2VASc = 4.  . Pneumonia 03/06/2003  . Spinal stenosis, unspecified region other than cervical   . Squamous cell carcinoma of skin of trunk, except  scrotum    skin cancer of shoulder  . Thoracic or lumbosacral neuritis or radiculitis, unspecified     Past Surgical History:  Procedure Laterality Date  . CARDIOVERSION N/A 06/20/2017   Procedure: CARDIOVERSION;  Surgeon: [name], MD;  Location: [facility] ORS;  Service: Cardiovascular;  Laterality: N/A;  . CATARACT EXTRACTION W/ INTRAOCULAR LENS  IMPLANT, BILATERAL    . CERVICAL FUSION  02/10/2011  . history of abd ultrasound  11/01   fatty liver  . MULTIPLE TOOTH EXTRACTIONS    . ORIF FIBULA FRACTURE Left 01/06/2017   Procedure: OPEN REDUCTION INTERNAL FIXATION (ORIF) FIBULA FRACTURE DISTAL FIBULA;  Surgeon: Melrose Nakayama, MD;  Location: [place];  Service: Orthopedics;  Laterality: Left;  Patient states has problems if he will have a tube in throat for Genera; Anesthesia    MEDICATIONS: I have reviewed all medications and confirmed regimen as documented  Social History   Social History  . Marital status: Married    Spouse name: N/A  . Number of children: 2  . Years of education: N/A   Occupational History  . 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 Smoking status: Former Smoker    Years: 1.00  . Smokeless tobacco: Never Used     Comment: stopped in 20's  . Alcohol use No  . Drug use: No  . Sexual  activity: Not Currently   Other Topics Concern  . Not on file   Social History Narrative   Regular exercise- yes- therapy    Family History  Problem Relation Age of Onset  . Stroke Father   . Atrial fibrillation Brother        on coumadin  . Heart disease Brother        AFib- coumadin  . Stroke Paternal Grandfather   . Cancer Other        colon cancer at early age  . Prostate cancer Neg Hx   . Kidney cancer Neg Hx   . Bladder Cancer Neg Hx     ROS: No fever, myalgias/arthralgias, unexplained weight loss or weight gain No new focal weakness or sensory deficits No otalgia, hearing loss, visual changes,  nasal and sinus symptoms, mouth and throat problems No neck pain or adenopathy No abdominal pain, N/V/D, diarrhea, change in bowel pattern No dysuria, change in urinary pattern   Vitals:   09/18/17 0933 09/18/17 0938  BP:  134/72  Pulse:  61  SpO2:  94%  Weight: 80.7 kg (178 lb)   Height: 5\' 6"  (1.676 m)      EXAM:  Gen: WDWN, No overt respiratory distress, he is in a wheelchair due to recent left ankle injury HEENT: NCAT, sclera white, oropharynx normal, changes of severe rhinitis R > L, both tympanic canals impacted with cerumen Neck: Supple without LAN, thyromegaly, JVD Lungs: breath sounds mildly diminished with faint bibasilar crackles, no wheezes, percussion note is normal throughout Cardiovascular: Regular, distant heart sounds, no murmurs noted Abdomen: Soft, nontender, normal BS Ext: without clubbing, cyanosis, edema, left ankle brace Neuro: CNs grossly intact, motor and sensory intact Skin: Limited exam, no lesions noted  DATA:   BMP Latest Ref Rng & Units 07/27/2017 07/12/2017 06/30/2017  Glucose 65 - 99 mg/dL 90 - 95  BUN 6 - 20 mg/dL 33(H) 23 38(H)  Creatinine 0.61 - 1.24 mg/dL 1.44(H) 1.34(H) 2.27(H)  BUN/Creat Ratio 10 - 24 - 17 -  Sodium 135 - 145 mmol/L 141 - 137  Potassium 3.5 - 5.1 mmol/L 4.6 - 5.0  Chloride 101 - 111 mmol/L 106 - 101  CO2 22 - 32 mmol/L 27 - 28  Calcium 8.9 - 10.3 mg/dL 9.6 - 9.1    CBC Latest Ref Rng & Units 06/16/2017 03/29/2017 03/27/2017  WBC 3.8 - 10.6 K/uL 6.1 6.5 6.9  Hemoglobin 13.0 - 18.0 g/dL 15.1 14.5 14.2  Hematocrit 40.0 - 52.0 % 44.2 43.4 42.8  Platelets 150 - 440 K/uL 205 210 207    CXR 08/18/17:  No acute cardiac or pulmonary findings  IMPRESSION:     ICD-10-CM   1. Chronic cough R05   2. Gastroesophageal reflux disease, esophagitis presence not specified K21.9   3. Chronic rhinitis J31.0   4. Bilateral impacted cerumen H61.23      PLAN:  Change Protonix to at dinnertime. Flonase nasal inhaler- 2 sprays per  nostril daily Consider cerumen (ear wax) disimpaction - there are kits that he Dalto purchase at a pharmacy or this Kass be done in a physician's office  Follow-up in 6-8 weeks   Merton Border, MD PCCM service Mobile [phone] Pager [phone] 09/18/2017 4:26 PM

## 2017-09-20 ENCOUNTER — Ambulatory Visit
Admission: RE | Admit: 2017-09-20 | Discharge: 2017-09-20 | Disposition: A | Discharge status: Home / Self Care | Payer: PPO | Source: Ambulatory Visit | Attending: Orthopaedic Surgery | Admitting: Orthopaedic Surgery

## 2017-09-20 ENCOUNTER — Other Ambulatory Visit: Payer: Self-pay | Admitting: Orthopaedic Surgery

## 2017-09-20 DIAGNOSIS — M25572 Pain in left ankle and joints of left foot: Secondary | ICD-10-CM

## 2017-09-20 DIAGNOSIS — S99912A Unspecified injury of left ankle, initial encounter: Secondary | ICD-10-CM | POA: Diagnosis not present

## 2017-09-20 DIAGNOSIS — M7989 Other specified soft tissue disorders: Secondary | ICD-10-CM | POA: Diagnosis not present

## 2017-09-26 DIAGNOSIS — M25572 Pain in left ankle and joints of left foot: Secondary | ICD-10-CM | POA: Diagnosis not present

## 2017-10-19 ENCOUNTER — Other Ambulatory Visit: Payer: Self-pay

## 2017-10-19 ENCOUNTER — Observation Stay (HOSPITAL_COMMUNITY)
Admission: EM | Admit: 2017-10-19 | Discharge: 2017-10-21 | Disposition: A | Discharge status: Home / Self Care | Payer: PPO | Attending: Internal Medicine | Admitting: Internal Medicine

## 2017-10-19 ENCOUNTER — Emergency Department (HOSPITAL_COMMUNITY): Payer: PPO

## 2017-10-19 ENCOUNTER — Encounter (HOSPITAL_COMMUNITY): Payer: Self-pay | Admitting: *Deleted

## 2017-10-19 DIAGNOSIS — Z7901 Long term (current) use of anticoagulants: Secondary | ICD-10-CM | POA: Diagnosis not present

## 2017-10-19 DIAGNOSIS — I5033 Acute on chronic diastolic (congestive) heart failure: Secondary | ICD-10-CM

## 2017-10-19 DIAGNOSIS — W19XXXA Unspecified fall, initial encounter: Secondary | ICD-10-CM | POA: Insufficient documentation

## 2017-10-19 DIAGNOSIS — I5032 Chronic diastolic (congestive) heart failure: Secondary | ICD-10-CM | POA: Insufficient documentation

## 2017-10-19 DIAGNOSIS — D68318 Other hemorrhagic disorder due to intrinsic circulating anticoagulants, antibodies, or inhibitors: Secondary | ICD-10-CM | POA: Diagnosis not present

## 2017-10-19 DIAGNOSIS — I10 Essential (primary) hypertension: Secondary | ICD-10-CM | POA: Diagnosis present

## 2017-10-19 DIAGNOSIS — R0602 Shortness of breath: Secondary | ICD-10-CM | POA: Diagnosis not present

## 2017-10-19 DIAGNOSIS — I110 Hypertensive heart disease with heart failure: Secondary | ICD-10-CM | POA: Insufficient documentation

## 2017-10-19 DIAGNOSIS — Z87891 Personal history of nicotine dependence: Secondary | ICD-10-CM | POA: Insufficient documentation

## 2017-10-19 DIAGNOSIS — M25562 Pain in left knee: Secondary | ICD-10-CM | POA: Diagnosis not present

## 2017-10-19 DIAGNOSIS — R55 Syncope and collapse: Secondary | ICD-10-CM | POA: Diagnosis not present

## 2017-10-19 DIAGNOSIS — R079 Chest pain, unspecified: Secondary | ICD-10-CM | POA: Diagnosis present

## 2017-10-19 DIAGNOSIS — Z7902 Long term (current) use of antithrombotics/antiplatelets: Secondary | ICD-10-CM | POA: Insufficient documentation

## 2017-10-19 DIAGNOSIS — R072 Precordial pain: Secondary | ICD-10-CM | POA: Insufficient documentation

## 2017-10-19 DIAGNOSIS — Y92019 Unspecified place in single-family (private) house as the place of occurrence of the external cause: Secondary | ICD-10-CM | POA: Insufficient documentation

## 2017-10-19 DIAGNOSIS — Y9301 Activity, walking, marching and hiking: Secondary | ICD-10-CM | POA: Insufficient documentation

## 2017-10-19 DIAGNOSIS — Z79899 Other long term (current) drug therapy: Secondary | ICD-10-CM | POA: Diagnosis not present

## 2017-10-19 DIAGNOSIS — M25552 Pain in left hip: Secondary | ICD-10-CM | POA: Diagnosis not present

## 2017-10-19 DIAGNOSIS — S199XXA Unspecified injury of neck, initial encounter: Secondary | ICD-10-CM | POA: Diagnosis not present

## 2017-10-19 DIAGNOSIS — Y998 Other external cause status: Secondary | ICD-10-CM | POA: Diagnosis not present

## 2017-10-19 DIAGNOSIS — S79912A Unspecified injury of left hip, initial encounter: Secondary | ICD-10-CM | POA: Diagnosis not present

## 2017-10-19 DIAGNOSIS — S8992XA Unspecified injury of left lower leg, initial encounter: Secondary | ICD-10-CM | POA: Diagnosis not present

## 2017-10-19 DIAGNOSIS — S0990XA Unspecified injury of head, initial encounter: Secondary | ICD-10-CM | POA: Diagnosis not present

## 2017-10-19 LAB — CBC
HEMATOCRIT: 43 % (ref 39.0–52.0)
Hemoglobin: 14.6 g/dL (ref 13.0–17.0)
MCH: 33.3 pg (ref 26.0–34.0)
MCHC: 34 g/dL (ref 30.0–36.0)
MCV: 97.9 fL (ref 78.0–100.0)
Platelets: 202 10*3/uL (ref 150–400)
RBC: 4.39 MIL/uL (ref 4.22–5.81)
RDW: 13.2 % (ref 11.5–15.5)
WBC: 6.4 10*3/uL (ref 4.0–10.5)

## 2017-10-19 LAB — BASIC METABOLIC PANEL
Anion gap: 8 (ref 5–15)
BUN: 20 mg/dL (ref 6–20)
CHLORIDE: 105 mmol/L (ref 101–111)
CO2: 24 mmol/L (ref 22–32)
Calcium: 9.3 mg/dL (ref 8.9–10.3)
Creatinine, Ser: 1.43 mg/dL — ABNORMAL HIGH (ref 0.61–1.24)
GFR calc non Af Amer: 44 mL/min — ABNORMAL LOW (ref >60)
GFR, EST AFRICAN AMERICAN: 51 mL/min — AB (ref >60)
Glucose, Bld: 138 mg/dL — ABNORMAL HIGH (ref 65–99)
POTASSIUM: 4.4 mmol/L (ref 3.5–5.1)
SODIUM: 137 mmol/L (ref 135–145)

## 2017-10-19 LAB — I-STAT TROPONIN, ED: Troponin i, poc: 0 ng/mL (ref 0.00–0.08)

## 2017-10-19 MED ORDER — FENTANYL CITRATE (PF) 100 MCG/2ML IJ SOLN
50.0000 ug | Freq: Once | INTRAMUSCULAR | Status: AC
  Administered 2017-10-19: 50 ug via INTRAVENOUS
  Filled 2017-10-19: qty 2

## 2017-10-19 NOTE — ED Triage Notes (Signed)
Pt to ED from home for chest pain with nausea and intermittent chest heaviness and syncope. Pt remembers opening food for his cat then waking up on the floor. Initial bp was 210/120 in afib then converted to NSR, EMS gave 324mg  asa and nitro x 4, bp dropped to 110 palpated. Pt reports improvement of symptoms with nitro. Also c/o L leg pain, hx of fracture and arthritis.

## 2017-10-19 NOTE — ED Provider Notes (Signed)
[facility] EMERGENCY DEPARTMENT Provider Note   CSN: [id number] Arrival date & time: 10/19/17  2112     History   Chief Complaint Chief Complaint  Patient presents with  . Chest Pain    HPI Eric [name] is a 83 y.o. male.  Eric [name] is a 83 y.o. Male who presents to the ED after syncope and chest pain.  Patient reports he was walking to feed his cat when he bent over and the next thing he knew he was waking up on the floor.  His wife reports that he fell backwards.  No seizure-like activity.  The patient does not remember falling.  He reports as soon as he awoke he had substernal chest pain that he rates at at a 6 out of 10.  He describes it as a pressure.  He reports some slight shortness of breath.  He was given 324 mg of aspirin and 2 doses of nitroglycerin by EMS prior to arrival.  He reports this brought his pain from a 6 out of 10 to a 4 out of 10 currently.  He reports ongoing left hip pain for the past several weeks.  He also reports left knee pain after his fall.  He reports a history of A. fib and is on Eliquis.  He is followed by cardiologist in [facility]  He denies fevers, headache, numbness, tingling, weakness, changes to his vision, abdominal pain, nausea, vomiting, or rashes.    The history is provided by the patient, medical records and a relative. No language interpreter was used.  Chest Pain   Associated symptoms include shortness of breath. Pertinent negatives include no abdominal pain, no back pain, no cough, no dizziness, no fever, no headaches, no nausea, no numbness, no palpitations, no vomiting and no weakness.    Past Medical History:  Diagnosis Date  . Anxiety   . Arthritis   . Chronic diastolic CHF (congestive heart failure) ([place])    a. 01/2011 Echo: EF 50-55%, gr1 DD, mild AI, nl RV fxn, mild TR/PR.  . DDD (degenerative disc disease), cervical   . Depressive disorder, not elsewhere classified   . Depressive disorder, not  elsewhere classified   . Difficult intubation   . DVT (deep venous thrombosis) ([place])   . Dysphagia, oral phase   . Dyspnea   . Edema   . History of kidney stones   . Hypertension   . Hypoxemia   . Impacted cerumen   . Mixed hyperlipidemia   . 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 Other myelopathy   . Pain in limb   . Palpitations   . Paroxysmal Atrial Fibrillation ([place])    a. a. 01/2011 in setting of post-op complications including aspiration pna;  b. CHA2DS2VASc = 4.  . Pneumonia 03/06/2003  . Spinal stenosis, unspecified region other than cervical   . 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 Thoracic or lumbosacral neuritis or radiculitis, unspecified     Patient Active Problem List   Diagnosis Date Noted  . S/P ORIF (open reduction internal fixation) fracture 03/07/2017  . Lower extremity edema 03/07/2017  . Chronic diastolic CHF (congestive heart failure) ([place]) 01/19/2015  . Hyperlipidemia, mixed 06/28/2013  . Cervical myelopathy with cervical radiculopathy 06/28/2013  . Cataract of both eyes 06/28/2013  . Anemia of chronic disease 04/14/2013  . Spondylosis, cervical, with myelopathy   .  Spinal stenosis, lumbar region, with neurogenic claudication   . DDD (degenerative disc disease), cervical   . PAF (paroxysmal atrial fibrillation) ([place])   . Hypertension   . Dizziness 11/09/2011  . Edema 05/18/2011  . Long term current use of anticoagulant 03/02/2011  . VITAMIN D DEFICIENCY 11/29/2010  . ANXIETY STATE, UNSPECIFIED 07/16/2009  . LOW BACK PAIN SYNDROME 03/17/2009  . NONTRAUMATIC RUPTURE OF TENDONS OF BICEPS 12/01/2008  . ERECTILE DYSFUNCTION, ORGANIC 12/10/2007  . Hyperlipidemia 05/28/2007  . SEXUAL DYSFUNCTION 05/28/2007  . HYPERTENSION, BENIGN ESSENTIAL 05/28/2007  . Atrial fibrillation ([place]) 05/28/2007  . FATTY LIVER DISEASE- ABD. U/S 11/01, MILD ELEVATED LFT'S 05/28/2007    Past Surgical  History:  Procedure Laterality Date  . CATARACT EXTRACTION W/ INTRAOCULAR LENS  IMPLANT, BILATERAL    . CERVICAL FUSION  02/10/2011  . history of abd ultrasound  11/01   fatty liver  . MULTIPLE TOOTH EXTRACTIONS         Home Medications    Prior to Admission medications   Medication Sig Start Date End Date Taking? Authorizing Provider  amiodarone (PACERONE) 200 MG tablet Take 1 tablet (200 mg total) by mouth 2 (two) times daily. 05/18/17   [name], MD  apixaban (ELIQUIS) 5 MG TABS tablet Take 1 tablet (5 mg total) by mouth 2 (two) times daily. 04/24/17   [name], MD  Cholecalciferol (VITAMIN D) 1000 UNITS capsule Take one tablet once daily 08/09/13   Reed, [name] L, DO  fluticasone (FLONASE) 50 MCG/ACT nasal spray Place 2 sprays into both nostrils daily. 09/18/17 09/18/18  Wilhelmina Mcardle, MD  furosemide (LASIX) 20 MG tablet Take 2 tablets (40 mg total) by mouth 2 (two) times daily. 06/19/17   [name], MD  gabapentin (NEURONTIN) 600 MG tablet Take 1 tablet (600 mg total) by mouth 3 (three) times daily. For nerve pain 04/14/17   Reed, [name] L, DO  metoprolol tartrate (LOPRESSOR) 100 MG tablet Take one tablet by mouth twice daily for heart 05/25/17   Reed, [name] L, DO  pantoprazole (PROTONIX) 40 MG tablet Take 1 tablet (40 mg total) by mouth at bedtime. 09/18/17   Wilhelmina Mcardle, MD  potassium chloride SA (K-DUR,KLOR-CON) 20 MEQ tablet Take 1 tablet (20 mEq total) by mouth daily. 05/25/17   Reed, [name] L, DO  pravastatin (PRAVACHOL) 80 MG tablet Take 80 mg by mouth daily.    [provider]    Family History Family History  Problem Relation Age of Onset  . Stroke Father   . Atrial fibrillation Brother        on coumadin  . Heart disease Brother        AFib- coumadin  . Stroke Paternal Grandfather   . Cancer Other        colon cancer at early age  . Prostate cancer Neg Hx   . Kidney cancer Neg Hx   . Bladder Cancer Neg Hx     Social  History Social History   Tobacco Use  . Smoking status: Former Smoker    Years: 1.00  . Smokeless tobacco: Never Used  . Tobacco comment: stopped in 20's  Substance Use Topics  . Alcohol use: No  . Drug use: No     Allergies   Morphine and related; Percocet [oxycodone-acetaminophen]; and Valium   Review of Systems Review of Systems  Constitutional: Negative for chills and fever.  HENT: Negative for congestion and sore throat.   Eyes: Negative for visual  disturbance.  Respiratory: Positive for shortness of breath. Negative for cough and wheezing.   Cardiovascular: Positive for chest pain. Negative for palpitations.  Gastrointestinal: Negative for abdominal pain, diarrhea, nausea and vomiting.  Genitourinary: Negative for dysuria.  Musculoskeletal: Positive for arthralgias. Negative for back pain and neck pain.  Skin: Negative for rash.  Neurological: Positive for syncope. Negative for dizziness, weakness, light-headedness, numbness and headaches.     Physical Exam Updated Vital Signs BP 101/71   Pulse 67   Temp 98.3 F (36.8 C)   Resp 20   SpO2 94%   Physical Exam  Constitutional: He is oriented to person, place, and time. He appears well-developed and well-nourished.  Non-toxic appearance. He does not appear ill. No distress.  HENT:  Head: Normocephalic and atraumatic.  Mouth/Throat: Oropharynx is clear and moist.  Eyes: Conjunctivae and EOM are normal. Pupils are equal, round, and reactive to light. Right eye exhibits no discharge. Left eye exhibits no discharge.  Neck: Neck supple. No JVD present. No tracheal deviation present.  Cardiovascular: Normal rate, regular rhythm, normal heart sounds and intact distal pulses. Exam reveals no gallop and no friction rub.  No murmur heard. Pulses:      Radial pulses are 2+ on the right side, and 2+ on the left side.       Dorsalis pedis pulses are 2+ on the right side, and 2+ on the left side.  Pulmonary/Chest: Effort  normal and breath sounds normal. No accessory muscle usage or stridor. No tachypnea. No respiratory distress. He has no decreased breath sounds. He has no wheezes. He has no rales.  Abdominal: Soft. There is no tenderness.  Musculoskeletal: He exhibits no edema.       Right lower leg: He exhibits no tenderness and no edema.       Left lower leg: He exhibits no tenderness and no edema.  Tenderness noted to the lateral aspect of his left hip as well as the anterior aspect of his left knee.  No deformity or ecchymosis noted.  No midline neck or back tenderness.  Lymphadenopathy:    He has no cervical adenopathy.  Neurological: He is alert and oriented to person, place, and time. He is not disoriented. No cranial nerve deficit. Coordination normal.  Patient is alert and oriented 3. Speech is clear and coherent. Cranial nerves are intact. Sensation and strength is intact to bilateral upper and lower extremities.  No pronator drift.  Finger to nose intact bilaterally.  Speech is clear and coherent.  EOMs are intact.  Vision is grossly intact.  Skin: Skin is warm and dry. Capillary refill takes less than 2 seconds. No rash noted. He is not diaphoretic. No erythema. No pallor.  Psychiatric: He has a normal mood and affect. His behavior is normal.  Nursing note and vitals reviewed.    ED Treatments / Results  Labs (all labs ordered are listed, but only abnormal results are displayed) Labs Reviewed  BASIC METABOLIC PANEL - Abnormal; Notable for the following components:      Result Value   Glucose, Bld 138 (*)    Creatinine, Ser 1.43 (*)    GFR calc non Af Amer 44 (*)    GFR calc Af Amer 51 (*)    All other components within normal limits  CBC  I-STAT TROPONIN, ED    EKG  EKG Interpretation  Date/Time:  Thursday October 19 2017 21:18:43 EST Ventricular Rate:  65 PR Interval:    QRS Duration: 279 QT  Interval:  433 QTC Calculation: 451 R Axis:   -16 Text Interpretation:  normal heart  rate.  No significant change since last tracing Confirmed by Zenovia Jarred [phone]) on 10/19/2017 9:40:04 PM       Radiology Dg Chest 2 View  Result Date: 10/19/2017 CLINICAL DATA:  Patient with chest pain.  Nausea. EXAM: CHEST  2 VIEW COMPARISON:  Chest radiograph 08/18/2017. FINDINGS: Monitoring leads overlie the patient. Cardiomegaly. Low lung volumes. No consolidative pulmonary opacities. No pleural effusion or pneumothorax. IMPRESSION: No acute cardiopulmonary process. Electronically Signed   By: Lovey Newcomer M.D.   On: 10/19/2017 21:45   Ct Head Wo Contrast  Result Date: 10/19/2017 CLINICAL DATA:  Syncope with mild head trauma.  Nausea. EXAM: CT HEAD WITHOUT CONTRAST CT CERVICAL SPINE WITHOUT CONTRAST TECHNIQUE: Multidetector CT imaging of the head and cervical spine was performed following the standard protocol without intravenous contrast. Multiplanar CT image reconstructions of the cervical spine were also generated. COMPARISON:  Head and cervical spine CT 07/02/2012 FINDINGS: CT HEAD FINDINGS Brain: No mass lesion, intraparenchymal hemorrhage or extra-axial collection. No evidence of acute cortical infarct. There is periventricular hypoattenuation compatible with chronic microvascular disease. Vascular: No hyperdense vessel or unexpected calcification. Skull: Normal visualized skull base, calvarium and extracranial soft tissues. Sinuses/Orbits: No sinus fluid levels or advanced mucosal thickening. No mastoid effusion. Normal orbits. CT CERVICAL SPINE FINDINGS Alignment: Grade 1 anterolisthesis at C4-C5, unchanged. No other static subluxation. Facets are aligned. Occipital condyles are normally positioned. Skull base and vertebrae: Posterior spinal fusion from C2-T1 with posterior decompression at C3-C6. No abnormal lucency surrounding any of the hardware. No acute fracture. There are flowing anterior osteophytes at all cervical levels. Soft tissues and spinal canal: No prevertebral fluid or  swelling. No visible canal hematoma. Disc levels: No advanced spinal canal or neural foraminal stenosis. Upper chest: No pneumothorax, pulmonary nodule or pleural effusion. Other: Normal visualized paraspinal cervical soft tissues. IMPRESSION: 1. Chronic ischemic microangiopathy without acute intracranial abnormality. 2. No acute fracture of the cervical spine. 3. Posterior spinal fusion from C2-T1 with posterior decompression at C3-C6. No hardware adverse features. Electronically Signed   By: Ulyses Jarred M.D.   On: 10/19/2017 23:23   Ct Cervical Spine Wo Contrast  Result Date: 10/19/2017 CLINICAL DATA:  Syncope with mild head trauma.  Nausea. EXAM: CT HEAD WITHOUT CONTRAST CT CERVICAL SPINE WITHOUT CONTRAST TECHNIQUE: Multidetector CT imaging of the head and cervical spine was performed following the standard protocol without intravenous contrast. Multiplanar CT image reconstructions of the cervical spine were also generated. COMPARISON:  Head and cervical spine CT 07/02/2012 FINDINGS: CT HEAD FINDINGS Brain: No mass lesion, intraparenchymal hemorrhage or extra-axial collection. No evidence of acute cortical infarct. There is periventricular hypoattenuation compatible with chronic microvascular disease. Vascular: No hyperdense vessel or unexpected calcification. Skull: Normal visualized skull base, calvarium and extracranial soft tissues. Sinuses/Orbits: No sinus fluid levels or advanced mucosal thickening. No mastoid effusion. Normal orbits. CT CERVICAL SPINE FINDINGS Alignment: Grade 1 anterolisthesis at C4-C5, unchanged. No other static subluxation. Facets are aligned. Occipital condyles are normally positioned. Skull base and vertebrae: Posterior spinal fusion from C2-T1 with posterior decompression at C3-C6. No abnormal lucency surrounding any of the hardware. No acute fracture. There are flowing anterior osteophytes at all cervical levels. Soft tissues and spinal canal: No prevertebral fluid or  swelling. No visible canal hematoma. Disc levels: No advanced spinal canal or neural foraminal stenosis. Upper chest: No pneumothorax, pulmonary nodule or pleural effusion. Other: Normal visualized paraspinal  cervical soft tissues. IMPRESSION: 1. Chronic ischemic microangiopathy without acute intracranial abnormality. 2. No acute fracture of the cervical spine. 3. Posterior spinal fusion from C2-T1 with posterior decompression at C3-C6. No hardware adverse features. Electronically Signed   By: Ulyses Jarred M.D.   On: 10/19/2017 23:23   Dg Knee Complete 4 Views Left  Result Date: 10/19/2017 CLINICAL DATA:  Patient fell with injury to the left side. EXAM: LEFT KNEE - COMPLETE 4+ VIEW COMPARISON:  None. FINDINGS: Degenerative changes in the left knee with medial greater than lateral compartment narrowing and small osteophyte formation in all 3 compartments. Mild chondrocalcinosis. Suggestion of osteochondral defect in the medial femoral condyles. No evidence of acute fracture or dislocation. No focal bone lesion or bone destruction. No significant effusion. Vascular calcifications. IMPRESSION: Mild degenerative changes in the left knee with possible small osteochondral defect in the medial femoral condyle. No acute displaced fractures identified. Electronically Signed   By: Lucienne Capers M.D.   On: 10/19/2017 23:44   Dg Hip Unilat With Pelvis 2-3 Views Left  Result Date: 10/19/2017 CLINICAL DATA:  Patient fell with injury to the left side. EXAM: DG HIP (WITH OR WITHOUT PELVIS) 2-3V LEFT COMPARISON:  None. FINDINGS: Degenerative changes in the left hip with narrowing of the acetabular joint space and sclerosis along the superior acetabulum. Small osteophyte formation on the femoral head. No evidence of acute fracture or dislocation of the left hip. No focal bone lesion or bone destruction. Degenerative changes also noted in the lower lumbar spine and right hip. SI joints and symphysis pubis are not  displaced. Vascular calcifications. IMPRESSION: Mild degenerative changes in the left hip. No acute displaced fractures identified. Electronically Signed   By: Lucienne Capers M.D.   On: 10/19/2017 23:45    Procedures Procedures (including critical care time)  CRITICAL CARE Performed by: Hanley Hays   Total critical care time: 35 minutes  Critical care time was exclusive of separately billable procedures and treating other patients.  Critical care was necessary to treat or prevent imminent or life-threatening deterioration.  Critical care was time spent personally by me on the following activities: development of treatment plan with patient and/or surrogate as well as nursing, discussions with consultants, evaluation of patient's response to treatment, examination of patient, obtaining history from patient or surrogate, ordering and performing treatments and interventions, ordering and review of laboratory studies, ordering and review of radiographic studies, pulse oximetry and re-evaluation of patient's condition.   Medications Ordered in ED Medications  nitroGLYCERIN 50 mg in dextrose 5 % 250 mL (0.2 mg/mL) infusion (5 mcg/min Intravenous New Bag/Given 10/20/17 0010)  nitroGLYCERIN 0.2 mg/mL in dextrose 5 % infusion (not administered)  fentaNYL (SUBLIMAZE) injection 50 mcg (50 mcg Intravenous Given 10/19/17 2254)     Initial Impression / Assessment and Plan / ED Course  I have reviewed the triage vital signs and the nursing notes.  Pertinent labs & imaging results that were available during my care of the patient were reviewed by me and considered in my medical decision making (see chart for details).     This is a 83 y.o. Male who presents to the ED after syncope and chest pain.  Patient reports he was walking to feed his cat when he bent over and the next thing he knew he was waking up on the floor.  His wife reports that he fell backwards.  No seizure-like activity.  The  patient does not remember falling.  He reports as soon as  he awoke he had substernal chest pain that he rates at at a 6 out of 10.  He describes it as a pressure.  He reports some slight shortness of breath.  He was given 324 mg of aspirin and 2 doses of nitroglycerin by EMS prior to arrival.  He reports this brought his pain from a 6 out of 10 to a 4 out of 10 currently.  He reports ongoing left hip pain for the past several weeks.  He also reports left knee pain after his fall.  He reports a history of A. fib and is on Eliquis.  He is followed by cardiologist in [facility].  On exam the patient is afebrile nontoxic-appearing.  He has no hypoxia, tachypnea or tachycardia on exam.  He appears to be in normal sinus rhythm on the monitor with a prolonged PR interval.  No evidence of STEMI on EKG.  He has no focal neurological deficits on exam.  He is moving all extremities without difficulty.  Initial troponin is not elevated.  BMP shows a creatinine around his baseline of 1.43.  His CBC is unremarkable.  Chest x-ray is unremarkable. CT head and cervical spine showed no acute findings.  X-ray of his left hip and knee also showed no acute findings.  Patient still having 4 out of 10 chest pain after fentanyl in the emergency department.  Will start on nitro drip to see if we can help some with his pain.  At reevaluation following imaging and starting nitro drip patient reports his pain is improved, but he is still having some chest pain.  Will admit for syncope and chest pain.  Patient and family agree with plan.  I consulted with hospitalist Dr. Tamala Julian who accepted the patient for admission.   Final Clinical Impressions(s) / ED Diagnoses   Final diagnoses:  Syncope and collapse  Precordial pain  Anticoagulated  Left hip pain  Acute pain of left knee    ED Discharge Orders    None       Waynetta Pean, PA-C 10/20/17 0046    Macarthur Critchley, MD 10/24/17 [phone]

## 2017-10-20 ENCOUNTER — Observation Stay (HOSPITAL_BASED_OUTPATIENT_CLINIC_OR_DEPARTMENT_OTHER): Payer: PPO

## 2017-10-20 ENCOUNTER — Telehealth: Payer: Self-pay | Admitting: Cardiovascular Disease

## 2017-10-20 DIAGNOSIS — R079 Chest pain, unspecified: Secondary | ICD-10-CM | POA: Diagnosis present

## 2017-10-20 DIAGNOSIS — R072 Precordial pain: Secondary | ICD-10-CM

## 2017-10-20 DIAGNOSIS — E782 Mixed hyperlipidemia: Secondary | ICD-10-CM

## 2017-10-20 DIAGNOSIS — I5030 Unspecified diastolic (congestive) heart failure: Secondary | ICD-10-CM

## 2017-10-20 DIAGNOSIS — R55 Syncope and collapse: Secondary | ICD-10-CM | POA: Diagnosis not present

## 2017-10-20 DIAGNOSIS — Z7901 Long term (current) use of anticoagulants: Secondary | ICD-10-CM | POA: Diagnosis not present

## 2017-10-20 DIAGNOSIS — I10 Essential (primary) hypertension: Secondary | ICD-10-CM | POA: Diagnosis not present

## 2017-10-20 DIAGNOSIS — I480 Paroxysmal atrial fibrillation: Secondary | ICD-10-CM | POA: Diagnosis not present

## 2017-10-20 HISTORY — DX: Chest pain, unspecified: R07.9

## 2017-10-20 LAB — CBC WITH DIFFERENTIAL/PLATELET
Basophils Absolute: 0 10*3/uL (ref 0.0–0.1)
Basophils Relative: 1 %
EOS ABS: 0.1 10*3/uL (ref 0.0–0.7)
EOS PCT: 1 %
HCT: 41.2 % (ref 39.0–52.0)
Hemoglobin: 13.5 g/dL (ref 13.0–17.0)
LYMPHS ABS: 1.9 10*3/uL (ref 0.7–4.0)
Lymphocytes Relative: 35 %
MCH: 32.4 pg (ref 26.0–34.0)
MCHC: 32.8 g/dL (ref 30.0–36.0)
MCV: 98.8 fL (ref 78.0–100.0)
MONO ABS: 0.5 10*3/uL (ref 0.1–1.0)
MONOS PCT: 9 %
Neutro Abs: 3 10*3/uL (ref 1.7–7.7)
Neutrophils Relative %: 54 %
PLATELETS: 186 10*3/uL (ref 150–400)
RBC: 4.17 MIL/uL — ABNORMAL LOW (ref 4.22–5.81)
RDW: 13.3 % (ref 11.5–15.5)
WBC: 5.5 10*3/uL (ref 4.0–10.5)

## 2017-10-20 LAB — BASIC METABOLIC PANEL
Anion gap: 9 (ref 5–15)
BUN: 18 mg/dL (ref 6–20)
CO2: 22 mmol/L (ref 22–32)
Calcium: 8.6 mg/dL — ABNORMAL LOW (ref 8.9–10.3)
Chloride: 109 mmol/L (ref 101–111)
Creatinine, Ser: 1.71 mg/dL — ABNORMAL HIGH (ref 0.61–1.24)
GFR calc Af Amer: 41 mL/min — ABNORMAL LOW (ref >60)
GFR, EST NON AFRICAN AMERICAN: 35 mL/min — AB (ref >60)
GLUCOSE: 95 mg/dL (ref 65–99)
Potassium: 4.2 mmol/L (ref 3.5–5.1)
SODIUM: 140 mmol/L (ref 135–145)

## 2017-10-20 LAB — LACTIC ACID, PLASMA
Lactic Acid, Venous: 2 mmol/L (ref 0.5–1.9)
Lactic Acid, Venous: 1.9 mmol/L (ref 0.5–1.9)

## 2017-10-20 LAB — TROPONIN I
TROPONIN I: 0.12 ng/mL — AB (ref <0.03)
Troponin I: <0.03 ng/mL (ref <0.03)
Troponin I: <0.03 ng/mL (ref <0.03)

## 2017-10-20 LAB — ECHOCARDIOGRAM COMPLETE

## 2017-10-20 MED ORDER — GABAPENTIN 300 MG PO CAPS
600.0000 mg | ORAL_CAPSULE | Freq: Three times a day (TID) | ORAL | Status: DC
  Administered 2017-10-20 – 2017-10-21 (×5): 600 mg via ORAL
  Filled 2017-10-20 (×5): qty 2

## 2017-10-20 MED ORDER — PRAVASTATIN SODIUM 40 MG PO TABS
80.0000 mg | ORAL_TABLET | Freq: Every day | ORAL | Status: DC
  Administered 2017-10-20 – 2017-10-21 (×2): 80 mg via ORAL
  Filled 2017-10-20 (×3): qty 2

## 2017-10-20 MED ORDER — FLUTICASONE PROPIONATE 50 MCG/ACT NA SUSP
2.0000 | Freq: Every day | NASAL | Status: DC
  Administered 2017-10-21: 2 via NASAL
  Filled 2017-10-20 (×3): qty 16

## 2017-10-20 MED ORDER — METOPROLOL TARTRATE 25 MG PO TABS
100.0000 mg | ORAL_TABLET | Freq: Two times a day (BID) | ORAL | Status: DC
  Administered 2017-10-20: 100 mg via ORAL
  Filled 2017-10-20: qty 4

## 2017-10-20 MED ORDER — POTASSIUM CHLORIDE CRYS ER 20 MEQ PO TBCR
20.0000 meq | EXTENDED_RELEASE_TABLET | Freq: Every day | ORAL | Status: DC
  Administered 2017-10-20: 20 meq via ORAL
  Filled 2017-10-20: qty 1

## 2017-10-20 MED ORDER — PANTOPRAZOLE SODIUM 40 MG PO TBEC
40.0000 mg | DELAYED_RELEASE_TABLET | Freq: Every day | ORAL | Status: DC
  Administered 2017-10-20 (×2): 40 mg via ORAL
  Filled 2017-10-20 (×2): qty 1

## 2017-10-20 MED ORDER — SODIUM CHLORIDE 0.9 % IV SOLN
INTRAVENOUS | Status: AC
  Administered 2017-10-20: 75 mL/h via INTRAVENOUS

## 2017-10-20 MED ORDER — AMIODARONE HCL 200 MG PO TABS
200.0000 mg | ORAL_TABLET | Freq: Two times a day (BID) | ORAL | Status: DC
  Administered 2017-10-20 – 2017-10-21 (×3): 200 mg via ORAL
  Filled 2017-10-20 (×3): qty 1

## 2017-10-20 MED ORDER — APIXABAN 5 MG PO TABS
5.0000 mg | ORAL_TABLET | Freq: Two times a day (BID) | ORAL | Status: DC
  Administered 2017-10-20 – 2017-10-21 (×3): 5 mg via ORAL
  Filled 2017-10-20 (×4): qty 1

## 2017-10-20 MED ORDER — FUROSEMIDE 40 MG PO TABS
40.0000 mg | ORAL_TABLET | Freq: Two times a day (BID) | ORAL | Status: DC
  Administered 2017-10-20 (×2): 40 mg via ORAL
  Filled 2017-10-20: qty 1
  Filled 2017-10-20: qty 2

## 2017-10-20 MED ORDER — ACETAMINOPHEN 325 MG PO TABS: 650.0000 mg | ORAL_TABLET | ORAL | Status: DC | PRN

## 2017-10-20 MED ORDER — FENTANYL CITRATE (PF) 100 MCG/2ML IJ SOLN
25.0000 ug | INTRAMUSCULAR | Status: DC | PRN
Start: 2017-10-20 — End: 2017-10-20

## 2017-10-20 MED ORDER — METOPROLOL TARTRATE 50 MG PO TABS: 50.0000 mg | ORAL_TABLET | Freq: Two times a day (BID) | ORAL | Status: DC

## 2017-10-20 MED ORDER — ONDANSETRON HCL 4 MG/2ML IJ SOLN: 4.0000 mg | Freq: Four times a day (QID) | INTRAMUSCULAR | Status: DC | PRN

## 2017-10-20 MED ORDER — NITROGLYCERIN IN D5W 200-5 MCG/ML-% IV SOLN
INTRAVENOUS | Status: AC
  Filled 2017-10-20: qty 250

## 2017-10-20 MED ORDER — GI COCKTAIL ~~LOC~~: 30.0000 mL | Freq: Four times a day (QID) | ORAL | Status: DC | PRN

## 2017-10-20 MED ORDER — NITROGLYCERIN IN D5W 200-5 MCG/ML-% IV SOLN
0.0000 ug/min | INTRAVENOUS | Status: DC
  Administered 2017-10-20: 5 ug/min via INTRAVENOUS

## 2017-10-20 NOTE — ED Notes (Signed)
Heart healthy diet ordered. 

## 2017-10-20 NOTE — H&P (Signed)
History and Physical    Eric [name] MRN:[id number] DOB: 08/17/1932 DOA: 10/19/2017  Referring MD/NP/PA: Waynetta Pean, PA-C PCP: [name], DO  Patient coming from: via EMS  Chief Complaint: Syncope  HPI: Eric [name] is a 83 y.o. male with medical history significant of PAF on Eliquis, diastolic CHF, HTN, HLD, DVT, and anxiety; who presents with complaints of passing out while bending over to feed the cat  and woke up on the floor.  Wife heard the patient fall and states that she found him on the floor on his back.  When he woke up he recalls complaining of sharp substernal chest pain he rates as a 5-6 out of 10. Other associated symptoms include mild shortness of breath, palpitations, left knee pain secondary to fall, and chills.    ED Course: En route EMS initial blood pressure was noted to be 210/120 and he was in atrial fibrillation.  He was given 324 mg of aspirin and sublingual nitroglycerin with some improvement in chest pain. He was also noted to have converted into normal sinus rhythm.  Once in the ED the patient was noted to be afebrile, heart rate 61-67, respirations 13-17, BP is 107/78 to 129/64, O2 saturation 92-95% on room air.  Lab work was unremarkable including initial troponin.  Imaging study showed no acute abnormalities.  Patient continued to complain of chest pain for which she was started on a nitroglycerin drip.  TRH called to admit.  Review of Systems  Constitutional: Positive for chills and malaise/fatigue.  HENT: Negative for ear discharge and nosebleeds.   Eyes: Negative for photophobia and pain.  Respiratory: Positive for shortness of breath. Negative for cough.   Cardiovascular: Positive for chest pain and leg swelling.  Gastrointestinal: Negative for abdominal pain, diarrhea, nausea and vomiting.  Genitourinary: Negative for dysuria and frequency.  Musculoskeletal: Positive for falls and myalgias.  Skin: Negative for itching and rash.  Neurological:  Positive for loss of consciousness. Negative for tingling, weakness and headaches.  Psychiatric/Behavioral: Negative for substance abuse and suicidal ideas. The patient is nervous/anxious.     Past Medical History:  Diagnosis Date  . Anxiety   . Arthritis   . Chronic diastolic CHF (congestive heart failure) ([place])    a. 01/2011 Echo: EF 50-55%, gr1 DD, mild AI, nl RV fxn, mild TR/PR.  . DDD (degenerative disc disease), cervical   . Depressive disorder, not elsewhere classified   . Depressive disorder, not elsewhere classified   . Difficult intubation   . DVT (deep venous thrombosis) ([place])   . Dysphagia, oral phase   . Dyspnea   . Edema   . History of kidney stones   . Hypertension   . Hypoxemia   . Impacted cerumen   . Mixed hyperlipidemia   . 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 Other myelopathy   . Pain in limb   . Palpitations   . Paroxysmal Atrial Fibrillation ([place])    a. a. 01/2011 in setting of post-op complications including aspiration pna;  b. CHA2DS2VASc = 4.  . Pneumonia 03/06/2003  . Spinal stenosis, unspecified region other than cervical   . 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 Thoracic or lumbosacral neuritis or radiculitis, unspecified     Past Surgical History:  Procedure Laterality Date  . CATARACT EXTRACTION W/ INTRAOCULAR LENS  IMPLANT, BILATERAL    . CERVICAL FUSION  02/10/2011  . history of abd ultrasound  11/01   fatty liver  . MULTIPLE TOOTH EXTRACTIONS       reports that he has quit smoking. He quit after 1.00 year of use. he has never used smokeless tobacco. He reports that he does not drink alcohol or use drugs.  Allergies  Allergen Reactions  . Morphine And Related Shortness Of Breath  . Percocet [Oxycodone-Acetaminophen] Shortness Of Breath  . Valium Shortness Of Breath    Family History  Problem Relation Age of Onset  . Stroke Father   . Atrial fibrillation  Brother        on coumadin  . Heart disease Brother        AFib- coumadin  . Stroke Paternal Grandfather   . Cancer Other        colon cancer at early age  . Prostate cancer Neg Hx   . Kidney cancer Neg Hx   . Bladder Cancer Neg Hx     Prior to Admission medications   Medication Sig Start Date End Date Taking? Authorizing Provider  amiodarone (PACERONE) 200 MG tablet Take 1 tablet (200 mg total) by mouth 2 (two) times daily. 05/18/17   [name], MD  apixaban (ELIQUIS) 5 MG TABS tablet Take 1 tablet (5 mg total) by mouth 2 (two) times daily. 04/24/17   [name], MD  Cholecalciferol (VITAMIN D) 1000 UNITS capsule Take one tablet once daily 08/09/13   Reed, [name] L, DO  fluticasone (FLONASE) 50 MCG/ACT nasal spray Place 2 sprays into both nostrils daily. 09/18/17 09/18/18  Wilhelmina Mcardle, MD  furosemide (LASIX) 20 MG tablet Take 2 tablets (40 mg total) by mouth 2 (two) times daily. 06/19/17   [name], MD  gabapentin (NEURONTIN) 600 MG tablet Take 1 tablet (600 mg total) by mouth 3 (three) times daily. For nerve pain 04/14/17   Reed, [name] L, DO  metoprolol tartrate (LOPRESSOR) 100 MG tablet Take one tablet by mouth twice daily for heart 05/25/17   Reed, [name] L, DO  pantoprazole (PROTONIX) 40 MG tablet Take 1 tablet (40 mg total) by mouth at bedtime. 09/18/17   Wilhelmina Mcardle, MD  potassium chloride SA (K-DUR,KLOR-CON) 20 MEQ tablet Take 1 tablet (20 mEq total) by mouth daily. 05/25/17   Reed, [name] L, DO  pravastatin (PRAVACHOL) 80 MG tablet Take 80 mg by mouth daily.    [provider]    Physical Exam:  Constitutional: Elderly male who appears in NAD, calm, comfortable Vitals:   10/19/17 2124 10/19/17 2215 10/19/17 2245 10/19/17 2345  BP: 129/64 (!) 108/95 (!) 116/57 107/78  Pulse: 64 67 65 61  Resp: 16 13 15 17   Temp: 98.3 F (36.8 C)     SpO2: 93% 95% 92% 92%   Eyes: PERRL, lids and conjunctivae normal ENMT: Mucous membranes are moist.  Posterior pharynx clear of any exudate or lesions.  Neck: normal, supple, no masses, no thyromegaly Respiratory: clear to auscultation bilaterally, no wheezing, no crackles. Normal respiratory effort. No accessory muscle use.  Cardiovascular: Regular rate and rhythm, no murmurs / rubs / gallops.  +1 lower extremity edema. 2+ pedal pulses. No carotid bruits.  Patient notes some pain symptoms reproducible with palpation substernally. Abdomen: no tenderness, no masses palpated. No hepatosplenomegaly. Bowel sounds positive.  Musculoskeletal: no clubbing / cyanosis. No joint deformity upper and lower extremities. Good ROM, no contractures. Normal muscle tone.  Skin: no rashes, lesions, ulcers. No induration Neurologic: CN 2-12  grossly intact. Sensation intact, DTR normal. Strength 5/5 in all 4.  Psychiatric: Normal judgment and insight. Alert and oriented x 3. Normal mood.     Labs on Admission: I have personally reviewed following labs and imaging studies  CBC: Recent Labs  Lab 10/19/17 2122  WBC 6.4  HGB 14.6  HCT 43.0  MCV 97.9  PLT 784   Basic Metabolic Panel: Recent Labs  Lab 10/19/17 2122  NA 137  K 4.4  CL 105  CO2 24  GLUCOSE 138*  BUN 20  CREATININE 1.43*  CALCIUM 9.3   GFR: CrCl cannot be calculated (Unknown ideal weight.). Liver Function Tests: No results for input(s): AST, ALT, ALKPHOS, BILITOT, PROT, ALBUMIN in the last 168 hours. No results for input(s): LIPASE, AMYLASE in the last 168 hours. No results for input(s): AMMONIA in the last 168 hours. Coagulation Profile: No results for input(s): INR, PROTIME in the last 168 hours. Cardiac Enzymes: No results for input(s): CKTOTAL, CKMB, CKMBINDEX, TROPONINI in the last 168 hours. BNP (last 3 results) No results for input(s): PROBNP in the last 8760 hours. HbA1C: No results for input(s): HGBA1C in the last 72 hours. CBG: No results for input(s): GLUCAP in the last 168 hours. Lipid Profile: No results for  input(s): CHOL, HDL, LDLCALC, TRIG, CHOLHDL, LDLDIRECT in the last 72 hours. Thyroid Function Tests: No results for input(s): TSH, T4TOTAL, FREET4, T3FREE, THYROIDAB in the last 72 hours. Anemia Panel: No results for input(s): VITAMINB12, FOLATE, FERRITIN, TIBC, IRON, RETICCTPCT in the last 72 hours. Urine analysis:    Component Value Date/Time   COLORURINE YELLOW 02/02/2011 1025   APPEARANCEUR Clear 07/27/2017 0954   LABSPEC 1.021 02/02/2011 1025   PHURINE 6.5 02/02/2011 1025   GLUCOSEU Negative 07/27/2017 0954   HGBUR NEGATIVE 02/02/2011 1025   BILIRUBINUR Negative 07/27/2017 [place] 02/02/2011 1025   PROTEINUR Negative 07/27/2017 0954   PROTEINUR NEGATIVE 02/02/2011 1025   UROBILINOGEN 0.2 02/02/2011 1025   NITRITE Negative 07/27/2017 0954   NITRITE NEGATIVE 02/02/2011 1025   LEUKOCYTESUR Negative 07/27/2017 0954   Sepsis Labs: No results found for this or any previous visit (from the past 240 hour(s)).   Radiological Exams on Admission: Dg Chest 2 View  Result Date: 10/19/2017 CLINICAL DATA:  Patient with chest pain.  Nausea. EXAM: CHEST  2 VIEW COMPARISON:  Chest radiograph 08/18/2017. FINDINGS: Monitoring leads overlie the patient. Cardiomegaly. Low lung volumes. No consolidative pulmonary opacities. No pleural effusion or pneumothorax. IMPRESSION: No acute cardiopulmonary process. Electronically Signed   By: Lovey Newcomer M.D.   On: 10/19/2017 21:45   Ct Head Wo Contrast  Result Date: 10/19/2017 CLINICAL DATA:  Syncope with mild head trauma.  Nausea. EXAM: CT HEAD WITHOUT CONTRAST CT CERVICAL SPINE WITHOUT CONTRAST TECHNIQUE: Multidetector CT imaging of the head and cervical spine was performed following the standard protocol without intravenous contrast. Multiplanar CT image reconstructions of the cervical spine were also generated. COMPARISON:  Head and cervical spine CT 07/02/2012 FINDINGS: CT HEAD FINDINGS Brain: No mass lesion, intraparenchymal hemorrhage  or extra-axial collection. No evidence of acute cortical infarct. There is periventricular hypoattenuation compatible with chronic microvascular disease. Vascular: No hyperdense vessel or unexpected calcification. Skull: Normal visualized skull base, calvarium and extracranial soft tissues. Sinuses/Orbits: No sinus fluid levels or advanced mucosal thickening. No mastoid effusion. Normal orbits. CT CERVICAL SPINE FINDINGS Alignment: Grade 1 anterolisthesis at C4-C5, unchanged. No other static subluxation. Facets are aligned. Occipital condyles are normally positioned. Skull base and vertebrae: Posterior  spinal fusion from C2-T1 with posterior decompression at C3-C6. No abnormal lucency surrounding any of the hardware. No acute fracture. There are flowing anterior osteophytes at all cervical levels. Soft tissues and spinal canal: No prevertebral fluid or swelling. No visible canal hematoma. Disc levels: No advanced spinal canal or neural foraminal stenosis. Upper chest: No pneumothorax, pulmonary nodule or pleural effusion. Other: Normal visualized paraspinal cervical soft tissues. IMPRESSION: 1. Chronic ischemic microangiopathy without acute intracranial abnormality. 2. No acute fracture of the cervical spine. 3. Posterior spinal fusion from C2-T1 with posterior decompression at C3-C6. No hardware adverse features. Electronically Signed   By: Ulyses Jarred M.D.   On: 10/19/2017 23:23   Ct Cervical Spine Wo Contrast  Result Date: 10/19/2017 CLINICAL DATA:  Syncope with mild head trauma.  Nausea. EXAM: CT HEAD WITHOUT CONTRAST CT CERVICAL SPINE WITHOUT CONTRAST TECHNIQUE: Multidetector CT imaging of the head and cervical spine was performed following the standard protocol without intravenous contrast. Multiplanar CT image reconstructions of the cervical spine were also generated. COMPARISON:  Head and cervical spine CT 07/02/2012 FINDINGS: CT HEAD FINDINGS Brain: No mass lesion, intraparenchymal hemorrhage or  extra-axial collection. No evidence of acute cortical infarct. There is periventricular hypoattenuation compatible with chronic microvascular disease. Vascular: No hyperdense vessel or unexpected calcification. Skull: Normal visualized skull base, calvarium and extracranial soft tissues. Sinuses/Orbits: No sinus fluid levels or advanced mucosal thickening. No mastoid effusion. Normal orbits. CT CERVICAL SPINE FINDINGS Alignment: Grade 1 anterolisthesis at C4-C5, unchanged. No other static subluxation. Facets are aligned. Occipital condyles are normally positioned. Skull base and vertebrae: Posterior spinal fusion from C2-T1 with posterior decompression at C3-C6. No abnormal lucency surrounding any of the hardware. No acute fracture. There are flowing anterior osteophytes at all cervical levels. Soft tissues and spinal canal: No prevertebral fluid or swelling. No visible canal hematoma. Disc levels: No advanced spinal canal or neural foraminal stenosis. Upper chest: No pneumothorax, pulmonary nodule or pleural effusion. Other: Normal visualized paraspinal cervical soft tissues. IMPRESSION: 1. Chronic ischemic microangiopathy without acute intracranial abnormality. 2. No acute fracture of the cervical spine. 3. Posterior spinal fusion from C2-T1 with posterior decompression at C3-C6. No hardware adverse features. Electronically Signed   By: Ulyses Jarred M.D.   On: 10/19/2017 23:23   Dg Knee Complete 4 Views Left  Result Date: 10/19/2017 CLINICAL DATA:  Patient fell with injury to the left side. EXAM: LEFT KNEE - COMPLETE 4+ VIEW COMPARISON:  None. FINDINGS: Degenerative changes in the left knee with medial greater than lateral compartment narrowing and small osteophyte formation in all 3 compartments. Mild chondrocalcinosis. Suggestion of osteochondral defect in the medial femoral condyles. No evidence of acute fracture or dislocation. No focal bone lesion or bone destruction. No significant effusion. Vascular  calcifications. IMPRESSION: Mild degenerative changes in the left knee with possible small osteochondral defect in the medial femoral condyle. No acute displaced fractures identified. Electronically Signed   By: Lucienne Capers M.D.   On: 10/19/2017 23:44   Dg Hip Unilat With Pelvis 2-3 Views Left  Result Date: 10/19/2017 CLINICAL DATA:  Patient fell with injury to the left side. EXAM: DG HIP (WITH OR WITHOUT PELVIS) 2-3V LEFT COMPARISON:  None. FINDINGS: Degenerative changes in the left hip with narrowing of the acetabular joint space and sclerosis along the superior acetabulum. Small osteophyte formation on the femoral head. No evidence of acute fracture or dislocation of the left hip. No focal bone lesion or bone destruction. Degenerative changes also noted in the lower lumbar  spine and right hip. SI joints and symphysis pubis are not displaced. Vascular calcifications. IMPRESSION: Mild degenerative changes in the left hip. No acute displaced fractures identified. Electronically Signed   By: Lucienne Capers M.D.   On: 10/19/2017 23:45    EKG: Independently reviewed.  Sinus rhythm with first-degree heart block  Assessment/Plan Chest pain: Acute.  Patient presents with complaints of substernal chest pain partially relieved with nitroglycerin.  Initial EKG showing no significant ischemic changes and troponin 0.0.  Patient was started on a nitroglycerin drip due to complaints of continued chest pain. - Admit to stepdown bed - Trend cardiac troponins - Check echocardiogram - Message sent to cardiology to formally eval in a.m    Syncope and collapse: Acute.  Suspect possibly vagal or secondary to arrhythmia as patient was initially found to be in atrial fibrillation.  Imaging studies showed no acute fractures or injury. - Follow-up telemetry overnight - Check orthostatic vital signs  Essential hypertension - Continue metoprolol and furosemide,  Paroxysmal atrial fibrillation on chronic  anticoagulation: Currently in sinus rhythm. CHADsVASc = least 4.  Patient is followed by a cardiologist in [place], [place]. - Continue Eliquis, amiodarone, and medications as seen above  Chronic diastolic CHF: It does not appear to be grossly fluid overloaded at this time he has some mild extremity edema, but chest sounds clear. - Monitor intake and output  Chronic kidney disease stage III: Stable.  Patient's creatinine is 1.43.  Baseline ranged from 0.9-1.5 over the last year. - Continue to monitor  Dyslipidemia: Patient's last lipid profile performed on 09/01/2017 notes total cholesterol 187, HDL 34, LDL 113, triglycerides 303 - Continue pravastatin  DVT prophylaxis: Eliquis Code Status: FULL Family Communication: Discussed plan of care with the patient and family present at bedside Disposition Plan: TBD Consults called: none Admission status: observation  Norval Morton MD [facility] Pager [phone]   If 7PM-7AM, please contact night-coverage [email or web address] Password [email or web address]  10/20/2017, 12:38 AM

## 2017-10-20 NOTE — Discharge Instructions (Signed)
Information on my medicine - ELIQUIS® (apixaban) ° °Why was Eliquis® prescribed for you? °Eliquis® was prescribed for you to reduce the risk of a blood clot forming that can cause a stroke if you have a medical condition called atrial fibrillation (a type of irregular heartbeat). ° °What do You need to know about Eliquis® ? °Take your Eliquis® TWICE DAILY - one tablet in the morning and one tablet in the evening with or without food. If you have difficulty swallowing the tablet whole please discuss with your pharmacist how to take the medication safely. ° °Take Eliquis® exactly as prescribed by your doctor and DO NOT stop taking Eliquis® without talking to the doctor who prescribed the medication.  Stopping [place] increase your risk of developing a stroke.  Refill your prescription before you run out. ° °After discharge, you should have regular check-up appointments with your healthcare provider that is prescribing your Eliquis®.  In the future your dose [place] need to be changed if your kidney function or weight changes by a significant amount or as you get older. ° °What do you do if you miss a dose? °If you miss a dose, take it as soon as you remember on the same day and resume taking twice daily.  Do not take more than one dose of ELIQUIS at the same time to make up a missed dose. ° °Important Safety Information °A possible side effect of Eliquis® is bleeding. You should call your healthcare provider right away if you experience any of the following: °? Bleeding from an injury or your nose that does not stop. °? Unusual colored urine (red or dark brown) or unusual colored stools (red or black). °? Unusual bruising for unknown reasons. °? A serious fall or if you hit your head (even if there is no bleeding). ° °Some medicines [place] interact with Eliquis® and might increase your risk of bleeding or clotting while on Eliquis®. To help avoid this, consult your healthcare provider or pharmacist prior to using any new  prescription or non-prescription medications, including herbals, vitamins, non-steroidal anti-inflammatory drugs (NSAIDs) and supplements. ° °This website has more information on Eliquis® (apixaban): http://www.eliquis.com/eliquis/home ° °

## 2017-10-20 NOTE — Progress Notes (Signed)
83 yo male with significant cardiac history adm with chest pain,seen by cards.patient on amio,bb and nitro drip along with lasix.he appears drycxr no evidence of chf.bp running low.decrease antihypertensives.  Hold Lasix reassess tomorrow and restart as needed.

## 2017-10-20 NOTE — Consult Note (Signed)
Cardiology Consultation:   Patient ID: Eric [name]; [id number]; 05-24-1932   Admit date: 10/19/2017 Date of Consult: 10/20/2017  Primary Care Provider: [name], DO Primary Cardiologist: Dr. Rockey Situ Primary Electrophysiologist:     Patient Profile:   Eric [name] is a 83 y.o. male with a hx of paroxysmal atrial fibrillation with recent cardioversion to NSR (06/20/17) on amio, lopressor, and eliquis, frequent PVCs, hx of bradycardia with 1st degree heart block, HLD, HTN, hx of DVT, dysphagia, and chronic diastolic heart failure who is being seen today for the evaluation of chest pain at the request of Dr. Rodena Piety.   History of Present Illness:   Mr. Poke last saw Dr. Rockey Situ in clinic on 06/30/17 as a follow up to a cardioversion from Afib to NSR. At that visit, he was still in NSR an compliant on his home medications. He is sedentary at home and reports leg weakness. Lasix was decreased from 40 mg TID to 40 mg BID for increased creatinine of 1.5. Prior to this, he was apparently taking his lasix incorrectly (40 mg x 3 BID). Recent echo with EF of 55-60% and mildly dilated left atrium.  Yesterday,  He bent over to feed the cat and suffered a syncopal event with a fall. He woke up in the floor. His wife was unable to get him up and they called their son. He reports chest pain after the fall and was given 324mg  ASA and nitro SL x 2 by EMS. He was brought to [facility] for further evaluation. Nitro drip was started and he is currently chest pain free.  On my interview, he states that he has had intermittent chest pain since Jan 2018. The pain is a sharp stabbing pain that lasts less than 2 sec and is not associated with exertion or rest. He walks with a walker and does not ambulate outdoors because the ground is very uneven. These bouts of chest pain are similar to the chest pain he felt after his fall.   He had a negative stress test 05/2017. His initial troponin was negative, but has since turned  positive at 0.12. EKG is with sinus rhythm. It is unclear if he had Afib RVR when EMS arrived. He is largely unaware of his Afib. He has been compliant on all medications. He states he has slightly more lower extremity edema than normal. Imaging in ED was all normal, without acute injury.    Past Medical History:  Diagnosis Date  . Anxiety   . Arthritis   . Chronic diastolic CHF (congestive heart failure) ([place])    a. 01/2011 Echo: EF 50-55%, gr1 DD, mild AI, nl RV fxn, mild TR/PR.  . DDD (degenerative disc disease), cervical   . Depressive disorder, not elsewhere classified   . Depressive disorder, not elsewhere classified   . Difficult intubation   . DVT (deep venous thrombosis) ([place])   . Dysphagia, oral phase   . Dyspnea   . Edema   . History of kidney stones   . Hypertension   . Hypoxemia   . Impacted cerumen   . Mixed hyperlipidemia   . 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 Other myelopathy   . Pain in limb   . Palpitations   . Paroxysmal Atrial Fibrillation ([place])    a. a. 01/2011 in setting of post-op complications including aspiration pna;  b. CHA2DS2VASc = 4.  . Pneumonia 03/06/2003  . Spinal stenosis, unspecified region other  than cervical   . 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 Thoracic or lumbosacral neuritis or radiculitis, unspecified     Past Surgical History:  Procedure Laterality Date  . CATARACT EXTRACTION W/ INTRAOCULAR LENS  IMPLANT, BILATERAL    . CERVICAL FUSION  02/10/2011  . history of abd ultrasound  11/01   fatty liver  . MULTIPLE TOOTH EXTRACTIONS       Home Medications:  Prior to Admission medications   Medication Sig Start Date End Date Taking? Authorizing Provider  amiodarone (PACERONE) 200 MG tablet Take 1 tablet (200 mg total) by mouth 2 (two) times daily. 05/18/17  Yes Gollan, Kathlene November, MD  apixaban (ELIQUIS) 5 MG TABS tablet Take 1 tablet (5 mg total) by mouth 2 (two)  times daily. 04/24/17  Yes [name], MD  Cholecalciferol (VITAMIN D) 1000 UNITS capsule Take one tablet once daily 08/09/13  Yes Reed, [name] L, DO  fluticasone (FLONASE) 50 MCG/ACT nasal spray Place 2 sprays into both nostrils daily. Patient taking differently: Place 2 sprays daily as needed into both nostrils for allergies.  09/18/17 09/18/18 Yes Wilhelmina Mcardle, MD  furosemide (LASIX) 20 MG tablet Take 2 tablets (40 mg total) by mouth 2 (two) times daily. 06/19/17  Yes Gollan, Kathlene November, MD  gabapentin (NEURONTIN) 600 MG tablet Take 1 tablet (600 mg total) by mouth 3 (three) times daily. For nerve pain 04/14/17  Yes Reed, [name] L, DO  metoprolol tartrate (LOPRESSOR) 100 MG tablet Take one tablet by mouth twice daily for heart 05/25/17  Yes Reed, [name] L, DO  pantoprazole (PROTONIX) 40 MG tablet Take 1 tablet (40 mg total) by mouth at bedtime. 09/18/17  Yes Wilhelmina Mcardle, MD  potassium chloride SA (K-DUR,KLOR-CON) 20 MEQ tablet Take 1 tablet (20 mEq total) by mouth daily. 05/25/17  Yes Reed, [name] L, DO  pravastatin (PRAVACHOL) 80 MG tablet Take 80 mg by mouth daily.   Yes [provider]    Inpatient Medications: Scheduled Meds: . amiodarone  200 mg Oral BID  . apixaban  5 mg Oral BID  . fluticasone  2 spray Each Nare Daily  . furosemide  40 mg Oral BID  . gabapentin  600 mg Oral TID  . metoprolol tartrate  100 mg Oral BID  . pantoprazole  40 mg Oral QHS  . potassium chloride SA  20 mEq Oral Daily  . pravastatin  80 mg Oral Daily   Continuous Infusions: . sodium chloride 75 mL/hr (10/20/17 0630)  . nitroGLYCERIN    . nitroGLYCERIN 7.5 mcg/min (10/20/17 0303)   PRN Meds: acetaminophen, fentaNYL (SUBLIMAZE) injection, gi cocktail, ondansetron (ZOFRAN) IV  Allergies:    Allergies  Allergen Reactions  . Morphine And Related Shortness Of Breath  . Percocet [Oxycodone-Acetaminophen] Shortness Of Breath  . Valium Shortness Of Breath    Social History:   Social  History   Socioeconomic History  . Marital status: Married    Spouse name: Not on file  . Number of children: 2  . Years of education: Not on file  . Highest education level: Not on file  Social Needs  . Financial resource strain: Not on file  . Food insecurity - worry: Not on file  . Food insecurity - inability: Not on file  . Transportation needs - medical: Not on file  . Transportation needs - non-medical: Not on file  Occupational History  . 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 Smoking status: Former Smoker    Years: 1.00  . Smokeless tobacco: Never Used  . Tobacco comment: stopped in 20's  Substance and Sexual Activity  . Alcohol use: No  . Drug use: No  . Sexual activity: Not Currently  Other Topics Concern  . Not on file  Social History Narrative   Regular exercise- yes- therapy    Family History:    Family History  Problem Relation Age of Onset  . Stroke Father   . Atrial fibrillation Brother        on coumadin  . Heart disease Brother        AFib- coumadin  . Stroke Paternal Grandfather   . Cancer Other        colon cancer at early age  . Prostate cancer Neg Hx   . Kidney cancer Neg Hx   . Bladder Cancer Neg Hx      ROS:  Please see the history of present illness.  ROS  All other ROS reviewed and negative.     Physical Exam/Data:   Vitals:   10/20/17 0445 10/20/17 0515 10/20/17 0545 10/20/17 0615  BP: 120/68 (!) 117/59 (!) 113/54 110/69  Pulse: 69 65 66 64  Resp: 19 12 18 12   Temp:      SpO2: 92% 93% 95% 96%   No intake or output data in the 24 hours ending 10/20/17 0909 There were no vitals filed for this visit. There is no height or weight on file to calculate BMI.  General:  Well nourished, well developed, in no acute distress HEENT: normal Neck: no JVD Vascular: No carotid bruits Cardiac:  normal S1, S2; RRR; no murmur  Lungs:  clear to auscultation  bilaterally, no wheezing, rhonchi or rales  Abd: soft, nontender, no hepatomegaly  Ext: no edema Musculoskeletal:  No deformities, BUE and BLE strength normal and equal Skin: warm and dry  Neuro:  CNs 2-12 intact, no focal abnormalities noted Psych:  Normal affect   EKG:  The EKG was personally reviewed and demonstrates:  Sinus with first degree heart block Telemetry:  Telemetry was personally reviewed and demonstrates:  Sinus, no Afib on tele  Relevant CV Studies:  Myoview 05/26/17: Pharmacological myocardial perfusion imaging study with no significant  ischemia Significant GI uptake artifact  Normal wall motion, EF estimated at 79% No EKG changes concerning for ischemia at peak stress or in recovery. Rhythm is atrial fibrillation, rare PVCs Low risk scan   Laboratory Data:  Chemistry Recent Labs  Lab 10/19/17 2122  NA 137  K 4.4  CL 105  CO2 24  GLUCOSE 138*  BUN 20  CREATININE 1.43*  CALCIUM 9.3  GFRNONAA 44*  GFRAA 51*  ANIONGAP 8    No results for input(s): PROT, ALBUMIN, AST, ALT, ALKPHOS, BILITOT in the last 168 hours. Hematology Recent Labs  Lab 10/19/17 2122  WBC 6.4  RBC 4.39  HGB 14.6  HCT 43.0  MCV 97.9  MCH 33.3  MCHC 34.0  RDW 13.2  PLT 202   Cardiac Enzymes Recent Labs  Lab 10/20/17 0355 10/20/17 0755  TROPONINI <0.03 0.12*    Recent Labs  Lab 10/19/17 2131  TROPIPOC 0.00    BNPNo results for input(s): BNP, PROBNP in the last 168 hours.  DDimer No results for input(s): DDIMER in the last 168 hours.  Radiology/Studies:  Dg Chest 2 View  Result Date:  10/19/2017 CLINICAL DATA:  Patient with chest pain.  Nausea. EXAM: CHEST  2 VIEW COMPARISON:  Chest radiograph 08/18/2017. FINDINGS: Monitoring leads overlie the patient. Cardiomegaly. Low lung volumes. No consolidative pulmonary opacities. No pleural effusion or pneumothorax. IMPRESSION: No acute cardiopulmonary process. Electronically Signed   By: Lovey Newcomer M.D.   On: 10/19/2017  21:45   Ct Head Wo Contrast  Result Date: 10/19/2017 CLINICAL DATA:  Syncope with mild head trauma.  Nausea. EXAM: CT HEAD WITHOUT CONTRAST CT CERVICAL SPINE WITHOUT CONTRAST TECHNIQUE: Multidetector CT imaging of the head and cervical spine was performed following the standard protocol without intravenous contrast. Multiplanar CT image reconstructions of the cervical spine were also generated. COMPARISON:  Head and cervical spine CT 07/02/2012 FINDINGS: CT HEAD FINDINGS Brain: No mass lesion, intraparenchymal hemorrhage or extra-axial collection. No evidence of acute cortical infarct. There is periventricular hypoattenuation compatible with chronic microvascular disease. Vascular: No hyperdense vessel or unexpected calcification. Skull: Normal visualized skull base, calvarium and extracranial soft tissues. Sinuses/Orbits: No sinus fluid levels or advanced mucosal thickening. No mastoid effusion. Normal orbits. CT CERVICAL SPINE FINDINGS Alignment: Grade 1 anterolisthesis at C4-C5, unchanged. No other static subluxation. Facets are aligned. Occipital condyles are normally positioned. Skull base and vertebrae: Posterior spinal fusion from C2-T1 with posterior decompression at C3-C6. No abnormal lucency surrounding any of the hardware. No acute fracture. There are flowing anterior osteophytes at all cervical levels. Soft tissues and spinal canal: No prevertebral fluid or swelling. No visible canal hematoma. Disc levels: No advanced spinal canal or neural foraminal stenosis. Upper chest: No pneumothorax, pulmonary nodule or pleural effusion. Other: Normal visualized paraspinal cervical soft tissues. IMPRESSION: 1. Chronic ischemic microangiopathy without acute intracranial abnormality. 2. No acute fracture of the cervical spine. 3. Posterior spinal fusion from C2-T1 with posterior decompression at C3-C6. No hardware adverse features. Electronically Signed   By: Ulyses Jarred M.D.   On: 10/19/2017 23:23   Ct  Cervical Spine Wo Contrast  Result Date: 10/19/2017 CLINICAL DATA:  Syncope with mild head trauma.  Nausea. EXAM: CT HEAD WITHOUT CONTRAST CT CERVICAL SPINE WITHOUT CONTRAST TECHNIQUE: Multidetector CT imaging of the head and cervical spine was performed following the standard protocol without intravenous contrast. Multiplanar CT image reconstructions of the cervical spine were also generated. COMPARISON:  Head and cervical spine CT 07/02/2012 FINDINGS: CT HEAD FINDINGS Brain: No mass lesion, intraparenchymal hemorrhage or extra-axial collection. No evidence of acute cortical infarct. There is periventricular hypoattenuation compatible with chronic microvascular disease. Vascular: No hyperdense vessel or unexpected calcification. Skull: Normal visualized skull base, calvarium and extracranial soft tissues. Sinuses/Orbits: No sinus fluid levels or advanced mucosal thickening. No mastoid effusion. Normal orbits. CT CERVICAL SPINE FINDINGS Alignment: Grade 1 anterolisthesis at C4-C5, unchanged. No other static subluxation. Facets are aligned. Occipital condyles are normally positioned. Skull base and vertebrae: Posterior spinal fusion from C2-T1 with posterior decompression at C3-C6. No abnormal lucency surrounding any of the hardware. No acute fracture. There are flowing anterior osteophytes at all cervical levels. Soft tissues and spinal canal: No prevertebral fluid or swelling. No visible canal hematoma. Disc levels: No advanced spinal canal or neural foraminal stenosis. Upper chest: No pneumothorax, pulmonary nodule or pleural effusion. Other: Normal visualized paraspinal cervical soft tissues. IMPRESSION: 1. Chronic ischemic microangiopathy without acute intracranial abnormality. 2. No acute fracture of the cervical spine. 3. Posterior spinal fusion from C2-T1 with posterior decompression at C3-C6. No hardware adverse features. Electronically Signed   By: Ulyses Jarred M.D.   On: 10/19/2017  23:23   Dg Knee  Complete 4 Views Left  Result Date: 10/19/2017 CLINICAL DATA:  Patient fell with injury to the left side. EXAM: LEFT KNEE - COMPLETE 4+ VIEW COMPARISON:  None. FINDINGS: Degenerative changes in the left knee with medial greater than lateral compartment narrowing and small osteophyte formation in all 3 compartments. Mild chondrocalcinosis. Suggestion of osteochondral defect in the medial femoral condyles. No evidence of acute fracture or dislocation. No focal bone lesion or bone destruction. No significant effusion. Vascular calcifications. IMPRESSION: Mild degenerative changes in the left knee with possible small osteochondral defect in the medial femoral condyle. No acute displaced fractures identified. Electronically Signed   By: Lucienne Capers M.D.   On: 10/19/2017 23:44   Dg Hip Unilat With Pelvis 2-3 Views Left  Result Date: 10/19/2017 CLINICAL DATA:  Patient fell with injury to the left side. EXAM: DG HIP (WITH OR WITHOUT PELVIS) 2-3V LEFT COMPARISON:  None. FINDINGS: Degenerative changes in the left hip with narrowing of the acetabular joint space and sclerosis along the superior acetabulum. Small osteophyte formation on the femoral head. No evidence of acute fracture or dislocation of the left hip. No focal bone lesion or bone destruction. Degenerative changes also noted in the lower lumbar spine and right hip. SI joints and symphysis pubis are not displaced. Vascular calcifications. IMPRESSION: Mild degenerative changes in the left hip. No acute displaced fractures identified. Electronically Signed   By: Lucienne Capers M.D.   On: 10/19/2017 23:45    Assessment and Plan:   1. Chest pain - troponin: 0.03 --> 0.12 - EKG without acute ischemic changes ASA and nitro decreased his pain from a 6 to a 4/10. He fluctuates in his description of the chest pain, sometimes describing it as a brief 1-2 second sharp stabbing pain to a pressure that is a little more constant. His recent stress test in  05/2017 is reassuring that this pain is not cardiac in nature. However, he has a mild elevated in troponin. Will recommend an echocardiogram to assess any new wall motion abnormality, structure and function. If abnormal, would consider repeat stress testing vs heart catheterization.   2. Paroxysmal atrial fibrillation It is unknown if he had an arrhythmia that contributed to his fall. He was NSR-sinus brady on EMS strips and on telemetry.    3. Syncope  - imaging negative for acute injury - [place] recommend an event monitor to assess dysrhythmia as a factor in his syncopal event   4. Chronic diastolic heart failure - he has positive JVD and lower extremity swelling that is above his baseline - he states that he and his wife try to watch their sodium intake, but sometimes eat fast fooe (twice this past week) - resume diuretic regimen - pt has not been weighed in the ED   5. Acute on chronic kidney injury stage III - sCr is 1.43, baseline appears to be 1.34-1.44 (07/2017) - continue diuresis and follow daily labs    For questions or updates, please contact [place] Please consult [email or web address] for contact info under Cardiology/STEMI.   Signed, Tami Lin Duke, PA  10/20/2017 9:09 AM As above, patient seen and examined. Briefly he is an 83 year old male with past medical history of paroxysmal atrial fibrillation status post recent cardioversion in July 2018, hypertension, hyperlipidemia, prior DVT, chronic diastolic congestive heart failure for evaluation of chest pain and syncope. Patient is a somewhat difficult historian. However he states he bent over to feed his cat yesterday and had  syncope. No preceding palpitations, chest pain, dyspnea or nausea. He is unclear about the duration of his event. His son helped him into a chair and then he developed chest pain. The pain increased with cough. It was described as a sharp stabbing pain. No associated symptoms. It has been continuous  since 7 PM yesterday without complete resolution. Some improvement with nitroglycerin. Cardiology asked to evaluate. Note based on emergency room notes the patient had transient atrial fibrillation/flutter but I have no rhythm strips to document this.   Laboratories show creatinine 1.43. Troponin is 0.00, <0.3 and 0.12. Electrocardiogram shows sinus rhythm with first-degree AV block.  1 chest pain-symptoms are atypical. They have been persistent and are somewhat reproduced with palpation. Question musculoskeletal. Electrocardiogram shows no ST changes. Initial troponins normal with follow-up 0.12. Question related to atrial arrhythmia documented in notes from EMS. Patient now in sinus. Would plan follow-up enzymes. If no clear trend would not pursue further evaluation as he had a negative nuclear study in June. If enzymes increase further he [place] require cardiac catheterization.  2 syncope-etiology is unclear. Would follow on telemetry. Note LV function has been normal but will plan repeat echocardiogram. If it remains normal would plan outpatient event monitor. Patient should not drive for 6 months.  3 history of paroxysmal atrial fibrillation-patient apparently was in atrial fibrillation but no documented EKGs or rhythm strips ; patient in sinus rhythm this morning. Would continue preadmission medications including amiodarone at 200 mg daily and Toprol. He does have a long first-degree AV block. I will decrease Toprol to 50 mg daily and follow. CHADSvasc 3; continue apixaban.  4 chronic diastolic congestive heart failure-not volume overloaded on examination. Continue preadmission dose of diuretic.  Kirk Ruths, MD

## 2017-10-20 NOTE — Telephone Encounter (Signed)
Left detailed voicemail message per release form that I would make Dr. Rockey Situ aware of this and to call back if any further questions.

## 2017-10-20 NOTE — Telephone Encounter (Signed)
Patient daughter calling and wants Dr. Rockey Situ to know patient was sent to [facility] via ambulance for syncope

## 2017-10-20 NOTE — ED Notes (Signed)
Pt placed on 2L oxygen, sats 89-91%; pt denies sob

## 2017-10-20 NOTE — Progress Notes (Signed)
  Echocardiogram 2D Echocardiogram has been performed.  Eric [name] 10/20/2017, 1:47 PM

## 2017-10-20 NOTE — ED Notes (Signed)
Admitting MD on bedside

## 2017-10-20 NOTE — ED Notes (Signed)
Dr. Stanford Breed at bedside

## 2017-10-20 NOTE — Progress Notes (Signed)
A.Duke PA-C called regarding pt low SBP range 86-100 and on pt being on NTG gtts and metoplrolol. New order received  To d/c NTG gtts. Will continue to  Monitor BP.

## 2017-10-20 NOTE — ED Notes (Signed)
Pt placed on hospital bed for comfort.

## 2017-10-21 ENCOUNTER — Other Ambulatory Visit: Payer: Self-pay | Admitting: Nurse Practitioner

## 2017-10-21 DIAGNOSIS — R748 Abnormal levels of other serum enzymes: Secondary | ICD-10-CM

## 2017-10-21 DIAGNOSIS — I480 Paroxysmal atrial fibrillation: Secondary | ICD-10-CM | POA: Diagnosis not present

## 2017-10-21 DIAGNOSIS — R0602 Shortness of breath: Secondary | ICD-10-CM | POA: Diagnosis not present

## 2017-10-21 DIAGNOSIS — M25562 Pain in left knee: Secondary | ICD-10-CM | POA: Diagnosis not present

## 2017-10-21 DIAGNOSIS — R55 Syncope and collapse: Secondary | ICD-10-CM

## 2017-10-21 DIAGNOSIS — Z7902 Long term (current) use of antithrombotics/antiplatelets: Secondary | ICD-10-CM | POA: Diagnosis not present

## 2017-10-21 DIAGNOSIS — M25552 Pain in left hip: Secondary | ICD-10-CM | POA: Diagnosis not present

## 2017-10-21 DIAGNOSIS — Z87891 Personal history of nicotine dependence: Secondary | ICD-10-CM | POA: Diagnosis not present

## 2017-10-21 DIAGNOSIS — R0789 Other chest pain: Secondary | ICD-10-CM

## 2017-10-21 DIAGNOSIS — I110 Hypertensive heart disease with heart failure: Secondary | ICD-10-CM | POA: Diagnosis not present

## 2017-10-21 DIAGNOSIS — Z79899 Other long term (current) drug therapy: Secondary | ICD-10-CM | POA: Diagnosis not present

## 2017-10-21 DIAGNOSIS — R072 Precordial pain: Secondary | ICD-10-CM | POA: Diagnosis not present

## 2017-10-21 DIAGNOSIS — I5032 Chronic diastolic (congestive) heart failure: Secondary | ICD-10-CM | POA: Diagnosis not present

## 2017-10-21 DIAGNOSIS — Z7901 Long term (current) use of anticoagulants: Secondary | ICD-10-CM | POA: Diagnosis not present

## 2017-10-21 DIAGNOSIS — R079 Chest pain, unspecified: Secondary | ICD-10-CM | POA: Diagnosis not present

## 2017-10-21 LAB — BASIC METABOLIC PANEL
Anion gap: 7 (ref 5–15)
BUN: 18 mg/dL (ref 6–20)
CHLORIDE: 105 mmol/L (ref 101–111)
CO2: 28 mmol/L (ref 22–32)
CREATININE: 1.54 mg/dL — AB (ref 0.61–1.24)
Calcium: 8.5 mg/dL — ABNORMAL LOW (ref 8.9–10.3)
GFR, EST AFRICAN AMERICAN: 46 mL/min — AB (ref >60)
GFR, EST NON AFRICAN AMERICAN: 40 mL/min — AB (ref >60)
Glucose, Bld: 87 mg/dL (ref 65–99)
POTASSIUM: 3.8 mmol/L (ref 3.5–5.1)
SODIUM: 140 mmol/L (ref 135–145)

## 2017-10-21 LAB — TROPONIN I

## 2017-10-21 MED ORDER — GABAPENTIN 600 MG PO TABS: 300.0000 mg | ORAL_TABLET | Freq: Three times a day (TID) | ORAL | 0 refills | Status: DC

## 2017-10-21 MED ORDER — METOPROLOL TARTRATE 100 MG PO TABS: ORAL_TABLET | ORAL | 3 refills | Status: DC

## 2017-10-21 MED ORDER — POTASSIUM CHLORIDE CRYS ER 20 MEQ PO TBCR: 10.0000 meq | EXTENDED_RELEASE_TABLET | Freq: Two times a day (BID) | ORAL | 3 refills | Status: DC

## 2017-10-21 MED ORDER — FUROSEMIDE 20 MG PO TABS: 20.0000 mg | ORAL_TABLET | Freq: Two times a day (BID) | ORAL | 1 refills | Status: DC

## 2017-10-21 MED ORDER — METOPROLOL TARTRATE 25 MG PO TABS: 25.0000 mg | ORAL_TABLET | Freq: Two times a day (BID) | ORAL | 11 refills | Status: DC

## 2017-10-21 NOTE — Care Management Note (Signed)
Case Management Note  Patient Details  Name: Eric [name] MRN: [id number] Date of Birth: Mar 23, 1932  Subjective/Objective:      Chest Pain            Action/Plan: Patient is discharging home today with Home Oxygen provided by [facility]; [facility] with [facility] called for arrangements and will deliver a portable oxygen tank to his room today prior to discharging home; CM talked to patient's daughter Jenny Reichmann for [facility] choices, she requested [facility]; [place] with [facility] called for arrangements.  Expected Discharge Date:  10/21/17               Expected Discharge Plan:  [place]  In-House Referral:   [facility]  Discharge planning Services  CM Consult  Choice offered to:  Adult Children  DME Arranged:  Oxygen DME Agency:  [facility] Arranged:  PT [place] Agency:  [facility] (now [facility])  Status of Service:  In process, will continue to follow  Sherrilyn Rist [phone] 10/21/2017, 4:02 PM

## 2017-10-21 NOTE — Progress Notes (Signed)
Pt has being having apneic( 1 to 2 an hr while asleep) episodes indicative of sleep apnea last night while asleep. He desaturates to lowest witnessed was  SpO2 of 80. I put pt on venturi mask which helped.

## 2017-10-21 NOTE — Progress Notes (Signed)
Progress Note  Patient Name: Eric [name] Date of Encounter: 10/21/2017  Primary Cardiologist: Dr. Ida Rogue  Subjective   No chest pain, palpitations, or dizziness.  Ate breakfast.  Inpatient Medications    Scheduled Meds: . amiodarone  200 mg Oral BID  . apixaban  5 mg Oral BID  . fluticasone  2 spray Each Nare Daily  . gabapentin  600 mg Oral TID  . pantoprazole  40 mg Oral QHS  . pravastatin  80 mg Oral Daily    PRN Meds: acetaminophen, gi cocktail, ondansetron (ZOFRAN) IV   Vital Signs    Vitals:   10/21/17 0300 10/21/17 0400 10/21/17 0442 10/21/17 0737  BP:   (!) 118/59 130/64  Pulse: 60 (!) 57 (!) 59   Resp: 12 16 19 15   Temp:   98 F (36.7 C) 98.2 F (36.8 C)  TempSrc:   Oral Oral  SpO2: 94% 90% 94%   Weight:   180 lb 11.2 oz (82 kg)     Intake/Output Summary (Last 24 hours) at 10/21/2017 0818 Last data filed at 10/21/2017 0517 Gross per 24 hour  Intake -  Output 2050 ml  Net -2050 ml   Filed Weights   10/21/17 0442  Weight: 180 lb 11.2 oz (82 kg)    Telemetry    Sinus rhythm with prolonged PR interval.  Single PVC.  One episode of brief Wenkebach.  No significant pauses or atrial fibrillation.  Personally reviewed.  ECG    Tracing from 10/21/2017 shows sinus bradycardia with prolonged PR interval, R' in lead V1 and V2.  Personally reviewed.  Physical Exam   GEN:  Elderly male.  No acute distress.   Neck: No JVD. Cardiac: RRR, soft systolic murmur, no gallop. Respiratory: Nonlabored. Clear to auscultation bilaterally. GI: Soft, nontender, bowel sounds present. MS:  Mild ankle edema; No deformity. Neuro:  Nonfocal. Psych: Alert and oriented x 3. Normal affect.  Labs    Chemistry Recent Labs  Lab 10/19/17 2122 10/20/17 1410 10/21/17 0219  NA 137 140 140  K 4.4 4.2 3.8  CL 105 109 105  CO2 24 22 28   GLUCOSE 138* 95 87  BUN 20 18 18   CREATININE 1.43* 1.71* 1.54*  CALCIUM 9.3 8.6* 8.5*  GFRNONAA 44* 35* 40*  GFRAA 51*  41* 46*  ANIONGAP 8 9 7      Hematology Recent Labs  Lab 10/19/17 2122 10/20/17 1410  WBC 6.4 5.5  RBC 4.39 4.17*  HGB 14.6 13.5  HCT 43.0 41.2  MCV 97.9 98.8  MCH 33.3 32.4  MCHC 34.0 32.8  RDW 13.2 13.3  PLT 202 186    Cardiac Enzymes Recent Labs  Lab 10/20/17 0755 10/20/17 1410 10/20/17 1902 10/21/17 0219  TROPONINI 0.12* <0.03 <0.03 <0.03    Recent Labs  Lab 10/19/17 2131  TROPIPOC 0.00     Radiology    Dg Chest 2 View  Result Date: 10/19/2017 CLINICAL DATA:  Patient with chest pain.  Nausea. EXAM: CHEST  2 VIEW COMPARISON:  Chest radiograph 08/18/2017. FINDINGS: Monitoring leads overlie the patient. Cardiomegaly. Low lung volumes. No consolidative pulmonary opacities. No pleural effusion or pneumothorax. IMPRESSION: No acute cardiopulmonary process. Electronically Signed   By: Lovey Newcomer M.D.   On: 10/19/2017 21:45   Ct Head Wo Contrast  Result Date: 10/19/2017 CLINICAL DATA:  Syncope with mild head trauma.  Nausea. EXAM: CT HEAD WITHOUT CONTRAST CT CERVICAL SPINE WITHOUT CONTRAST TECHNIQUE: Multidetector CT imaging of the head and cervical  spine was performed following the standard protocol without intravenous contrast. Multiplanar CT image reconstructions of the cervical spine were also generated. COMPARISON:  Head and cervical spine CT 07/02/2012 FINDINGS: CT HEAD FINDINGS Brain: No mass lesion, intraparenchymal hemorrhage or extra-axial collection. No evidence of acute cortical infarct. There is periventricular hypoattenuation compatible with chronic microvascular disease. Vascular: No hyperdense vessel or unexpected calcification. Skull: Normal visualized skull base, calvarium and extracranial soft tissues. Sinuses/Orbits: No sinus fluid levels or advanced mucosal thickening. No mastoid effusion. Normal orbits. CT CERVICAL SPINE FINDINGS Alignment: Grade 1 anterolisthesis at C4-C5, unchanged. No other static subluxation. Facets are aligned. Occipital condyles are  normally positioned. Skull base and vertebrae: Posterior spinal fusion from C2-T1 with posterior decompression at C3-C6. No abnormal lucency surrounding any of the hardware. No acute fracture. There are flowing anterior osteophytes at all cervical levels. Soft tissues and spinal canal: No prevertebral fluid or swelling. No visible canal hematoma. Disc levels: No advanced spinal canal or neural foraminal stenosis. Upper chest: No pneumothorax, pulmonary nodule or pleural effusion. Other: Normal visualized paraspinal cervical soft tissues. IMPRESSION: 1. Chronic ischemic microangiopathy without acute intracranial abnormality. 2. No acute fracture of the cervical spine. 3. Posterior spinal fusion from C2-T1 with posterior decompression at C3-C6. No hardware adverse features. Electronically Signed   By: Ulyses Jarred M.D.   On: 10/19/2017 23:23   Ct Cervical Spine Wo Contrast  Result Date: 10/19/2017 CLINICAL DATA:  Syncope with mild head trauma.  Nausea. EXAM: CT HEAD WITHOUT CONTRAST CT CERVICAL SPINE WITHOUT CONTRAST TECHNIQUE: Multidetector CT imaging of the head and cervical spine was performed following the standard protocol without intravenous contrast. Multiplanar CT image reconstructions of the cervical spine were also generated. COMPARISON:  Head and cervical spine CT 07/02/2012 FINDINGS: CT HEAD FINDINGS Brain: No mass lesion, intraparenchymal hemorrhage or extra-axial collection. No evidence of acute cortical infarct. There is periventricular hypoattenuation compatible with chronic microvascular disease. Vascular: No hyperdense vessel or unexpected calcification. Skull: Normal visualized skull base, calvarium and extracranial soft tissues. Sinuses/Orbits: No sinus fluid levels or advanced mucosal thickening. No mastoid effusion. Normal orbits. CT CERVICAL SPINE FINDINGS Alignment: Grade 1 anterolisthesis at C4-C5, unchanged. No other static subluxation. Facets are aligned. Occipital condyles are normally  positioned. Skull base and vertebrae: Posterior spinal fusion from C2-T1 with posterior decompression at C3-C6. No abnormal lucency surrounding any of the hardware. No acute fracture. There are flowing anterior osteophytes at all cervical levels. Soft tissues and spinal canal: No prevertebral fluid or swelling. No visible canal hematoma. Disc levels: No advanced spinal canal or neural foraminal stenosis. Upper chest: No pneumothorax, pulmonary nodule or pleural effusion. Other: Normal visualized paraspinal cervical soft tissues. IMPRESSION: 1. Chronic ischemic microangiopathy without acute intracranial abnormality. 2. No acute fracture of the cervical spine. 3. Posterior spinal fusion from C2-T1 with posterior decompression at C3-C6. No hardware adverse features. Electronically Signed   By: Ulyses Jarred M.D.   On: 10/19/2017 23:23   Dg Knee Complete 4 Views Left  Result Date: 10/19/2017 CLINICAL DATA:  Patient fell with injury to the left side. EXAM: LEFT KNEE - COMPLETE 4+ VIEW COMPARISON:  None. FINDINGS: Degenerative changes in the left knee with medial greater than lateral compartment narrowing and small osteophyte formation in all 3 compartments. Mild chondrocalcinosis. Suggestion of osteochondral defect in the medial femoral condyles. No evidence of acute fracture or dislocation. No focal bone lesion or bone destruction. No significant effusion. Vascular calcifications. IMPRESSION: Mild degenerative changes in the left knee with possible small  osteochondral defect in the medial femoral condyle. No acute displaced fractures identified. Electronically Signed   By: Lucienne Capers M.D.   On: 10/19/2017 23:44   Dg Hip Unilat With Pelvis 2-3 Views Left  Result Date: 10/19/2017 CLINICAL DATA:  Patient fell with injury to the left side. EXAM: DG HIP (WITH OR WITHOUT PELVIS) 2-3V LEFT COMPARISON:  None. FINDINGS: Degenerative changes in the left hip with narrowing of the acetabular joint space and sclerosis  along the superior acetabulum. Small osteophyte formation on the femoral head. No evidence of acute fracture or dislocation of the left hip. No focal bone lesion or bone destruction. Degenerative changes also noted in the lower lumbar spine and right hip. SI joints and symphysis pubis are not displaced. Vascular calcifications. IMPRESSION: Mild degenerative changes in the left hip. No acute displaced fractures identified. Electronically Signed   By: Lucienne Capers M.D.   On: 10/19/2017 23:45    Cardiac Studies   Echocardiogram 10/20/2017: Study Conclusions  - Left ventricle: The cavity size was normal. Wall thickness was   increased in a pattern of mild LVH. Systolic function was normal.   The estimated ejection fraction was in the range of 60% to 65%.   Wall motion was normal; there were no regional wall motion   abnormalities. Features are consistent with a pseudonormal left   ventricular filling pattern, with concomitant abnormal relaxation   and increased filling pressure (grade 2 diastolic dysfunction).  Patient Profile     83 y.o. male with a history of PAF status post cardioversion in July of this year and maintaining sinus rhythm on amiodarone, hypertension, hyperlipidemia, and ecurring atypical chest pain with low risk Myoview in June of this year.  He is admitted after an episode of syncope that occurred while bending over on baseline history of his recurring chest discomfort.  Assessment & Plan    1.  Recurrent atypical chest pain with risk Myoview in June of this year.  Troponin I trend is not consistent with ACS with single spurious elevation.  ECG without acute ST segment changes.  Symptoms were not necessarily associated with his presentation.  2.  Single abnormal troponin I level of 0.12 with all subsequent checks negative, question spurious elevation.  3.  Paroxysmal atrial fibrillation, maintaining sinus rhythm following cardioversion in July of this year, on amiodarone  and Eliquis.  Lopressor held overnight by primary team due to mildly low blood pressure and heart rates.  He has prolonged PR interval at baseline, single episode of brief Wenkebach on telemetry but no pauses.  4.  Episode of syncope.  LVEF normal by follow-up echocardiogram.  No definite rhythm change on telemetry to explain the event.  Have patient ambulate this morning.  If he does well, consider discharge home with plan for an outpatient 30-day event recorder which we will have arranged through the [facility] practice with Dr. Rockey Situ.  Continue amiodarone and Eliquis.  Reduce outpatient Lopressor dose to 50 mg twice daily (half prior dose).  As mentioned in Dr. Jacalyn Lefevre consultation, patient should not drive for 6 months pending further workup of his syncopal event.  Signed, Rozann Lesches, MD  10/21/2017, 8:18 AM

## 2017-10-21 NOTE — Progress Notes (Signed)
Pt ambulated to 120 feet on room air . Oxygen saturation at 86 % midway, paused to catch breath and had maintained oxygen saturation at 92-97 % the rest of the way.

## 2017-10-21 NOTE — Discharge Summary (Signed)
Physician Discharge Summary  Eric [name] MRN:[id number] DOB: 1932-07-30 DOA: 10/19/2017  PCP: [name], DO  Admit date: 10/19/2017 Discharge date: 10/21/2017  Admitted From:home Disposition: home Recommendations for Outpatient Follow-up:  1. Follow up with PCP in 1-2 weeks 2. Please obtain BMP/CBC in one week Home Health:yes Equipment/Devices oxygen  Discharge Condition stable CODE STATUS: Full code Diet recommendation: Cardiac Brief/Interim Summary: 83 y.o. male with medical history significant of PAF on Eliquis, diastolic CHF, HTN, HLD, DVT, and anxiety; who presents with complaints of passing out while bending over to feed the cat  and woke up on the floor.  Wife heard the patient fall and states that she found him on the floor on his back.  When he woke up he recalls complaining of sharp substernal chest pain he rates as a 5-6 out of 10. Other associated symptoms include mild shortness of breath, palpitations, left knee pain secondary to fall, and chills.    ED Course: En route EMS initial blood pressure was noted to be 210/120 and he was in atrial fibrillation.  He was given 324 mg of aspirin and sublingual nitroglycerin with some improvement in chest pain. He was also noted to have converted into normal sinus rhythm.  Once in the ED the patient was noted to be afebrile, heart rate 61-67, respirations 13-17, BP is 107/78 to 129/64, O2 saturation 92-95% on room air.  Lab work was unremarkable including initial troponin.   Nitroglycerin was stopped yesterday evening secondary to low blood pressure.  Lasix was on hold and beta-blocker was on hold.  In spite of that his blood pressure is ranging anywhere from 100 to less than 914 systolic.  Patient ambulated with the nurse and his oxygen saturation dropped to 86% upon activity.  Patient saturation on room air at rest was 94% while ambulating it decreased to 86%.  And on 2 L while ambulating is 97%.    Discharge Diagnoses:  Principal  Problem:   Chest pain Active Problems:   Atrial fibrillation (HCC)   Hypertension   Hyperlipidemia, mixed   Syncope and collapse  Paroxysmal atrial fibrillation/syncope-no evidence of acute coronary syndrome.  Patient with atypical chest pain.  Patient to have an event monitor placed by cardiology as an outpatient.  Will arrange for home oxygen since the oxygen saturation dropped with activity.  Discussed in detail with patient's daughter Jenny Reichmann.  New amiodarone Eliquis.  Lasix dose was decreased to 20 mg twice a day and Lopressor dose was decreased to 25 mg twice a day.  Family to check his blood pressure at home prior to giving blood pressure medications.  Discharge Instructions   Allergies as of 10/21/2017      Reactions   Morphine And Related Shortness Of Breath   Percocet [oxycodone-acetaminophen] Shortness Of Breath   Valium Shortness Of Breath      Medication List    TAKE these medications   amiodarone 200 MG tablet Commonly known as:  PACERONE Take 1 tablet (200 mg total) by mouth 2 (two) times daily.   apixaban 5 MG Tabs tablet Commonly known as:  ELIQUIS Take 1 tablet (5 mg total) by mouth 2 (two) times daily.   fluticasone 50 MCG/ACT nasal spray Commonly known as:  FLONASE Place 2 sprays into both nostrils daily. What changed:    when to take this  reasons to take this   furosemide 20 MG tablet Commonly known as:  LASIX Take 1 tablet (20 mg total) 2 (two) times daily  by mouth. What changed:  how much to take   gabapentin 600 MG tablet Commonly known as:  NEURONTIN Take 0.5 tablets (300 mg total) 3 (three) times daily by mouth. For nerve pain What changed:  how much to take   metoprolol tartrate 25 MG tablet Commonly known as:  LOPRESSOR Take 1 tablet (25 mg total) 2 (two) times daily by mouth. What changed:    medication strength  how much to take  how to take this  when to take this  additional instructions   pantoprazole 40 MG  tablet Commonly known as:  PROTONIX Take 1 tablet (40 mg total) by mouth at bedtime.   potassium chloride SA 20 MEQ tablet Commonly known as:  K-DUR,KLOR-CON Take 0.5 tablets (10 mEq total) 2 (two) times daily by mouth. What changed:    how much to take  when to take this   pravastatin 80 MG tablet Commonly known as:  PRAVACHOL Take 80 mg by mouth daily.   Vitamin D 1000 units capsule Take one tablet once daily            Durable Medical Equipment  (From admission, onward)        Start     Ordered   10/21/17 1549  For home use only DME oxygen  Once    Question Answer Comment  Mode or (Route) Nasal cannula   Liters per Minute 2   Frequency Continuous (stationary and portable oxygen unit needed)   Oxygen conserving device Yes   Oxygen delivery system Gas      10/21/17 1548     Follow-up Information    Rise Mu, PA-C Follow up on 11/27/2017.   Specialties:  Physician Assistant, Cardiology, Radiology Why:  2:00 PM - Dr. Donivan Scull PA  **We will arrange for 30 day event monitor and contact you early next week.** Contact information: [address] [place] [address] [phone]          Allergies  Allergen Reactions  . Morphine And Related Shortness Of Breath  . Percocet [Oxycodone-Acetaminophen] Shortness Of Breath  . Valium Shortness Of Breath    Consultations:  cardiology   Procedures/Studies: Dg Chest 2 View  Result Date: 10/19/2017 CLINICAL DATA:  Patient with chest pain.  Nausea. EXAM: CHEST  2 VIEW COMPARISON:  Chest radiograph 08/18/2017. FINDINGS: Monitoring leads overlie the patient. Cardiomegaly. Low lung volumes. No consolidative pulmonary opacities. No pleural effusion or pneumothorax. IMPRESSION: No acute cardiopulmonary process. Electronically Signed   By: Lovey Newcomer M.D.   On: 10/19/2017 21:45   Ct Head Wo Contrast  Result Date: 10/19/2017 CLINICAL DATA:  Syncope with mild head trauma.  Nausea. EXAM: CT HEAD  WITHOUT CONTRAST CT CERVICAL SPINE WITHOUT CONTRAST TECHNIQUE: Multidetector CT imaging of the head and cervical spine was performed following the standard protocol without intravenous contrast. Multiplanar CT image reconstructions of the cervical spine were also generated. COMPARISON:  Head and cervical spine CT 07/02/2012 FINDINGS: CT HEAD FINDINGS Brain: No mass lesion, intraparenchymal hemorrhage or extra-axial collection. No evidence of acute cortical infarct. There is periventricular hypoattenuation compatible with chronic microvascular disease. Vascular: No hyperdense vessel or unexpected calcification. Skull: Normal visualized skull base, calvarium and extracranial soft tissues. Sinuses/Orbits: No sinus fluid levels or advanced mucosal thickening. No mastoid effusion. Normal orbits. CT CERVICAL SPINE FINDINGS Alignment: Grade 1 anterolisthesis at C4-C5, unchanged. No other static subluxation. Facets are aligned. Occipital condyles are normally positioned. Skull base and vertebrae: Posterior spinal fusion from C2-T1  with posterior decompression at C3-C6. No abnormal lucency surrounding any of the hardware. No acute fracture. There are flowing anterior osteophytes at all cervical levels. Soft tissues and spinal canal: No prevertebral fluid or swelling. No visible canal hematoma. Disc levels: No advanced spinal canal or neural foraminal stenosis. Upper chest: No pneumothorax, pulmonary nodule or pleural effusion. Other: Normal visualized paraspinal cervical soft tissues. IMPRESSION: 1. Chronic ischemic microangiopathy without acute intracranial abnormality. 2. No acute fracture of the cervical spine. 3. Posterior spinal fusion from C2-T1 with posterior decompression at C3-C6. No hardware adverse features. Electronically Signed   By: Ulyses Jarred M.D.   On: 10/19/2017 23:23   Ct Cervical Spine Wo Contrast  Result Date: 10/19/2017 CLINICAL DATA:  Syncope with mild head trauma.  Nausea. EXAM: CT HEAD WITHOUT  CONTRAST CT CERVICAL SPINE WITHOUT CONTRAST TECHNIQUE: Multidetector CT imaging of the head and cervical spine was performed following the standard protocol without intravenous contrast. Multiplanar CT image reconstructions of the cervical spine were also generated. COMPARISON:  Head and cervical spine CT 07/02/2012 FINDINGS: CT HEAD FINDINGS Brain: No mass lesion, intraparenchymal hemorrhage or extra-axial collection. No evidence of acute cortical infarct. There is periventricular hypoattenuation compatible with chronic microvascular disease. Vascular: No hyperdense vessel or unexpected calcification. Skull: Normal visualized skull base, calvarium and extracranial soft tissues. Sinuses/Orbits: No sinus fluid levels or advanced mucosal thickening. No mastoid effusion. Normal orbits. CT CERVICAL SPINE FINDINGS Alignment: Grade 1 anterolisthesis at C4-C5, unchanged. No other static subluxation. Facets are aligned. Occipital condyles are normally positioned. Skull base and vertebrae: Posterior spinal fusion from C2-T1 with posterior decompression at C3-C6. No abnormal lucency surrounding any of the hardware. No acute fracture. There are flowing anterior osteophytes at all cervical levels. Soft tissues and spinal canal: No prevertebral fluid or swelling. No visible canal hematoma. Disc levels: No advanced spinal canal or neural foraminal stenosis. Upper chest: No pneumothorax, pulmonary nodule or pleural effusion. Other: Normal visualized paraspinal cervical soft tissues. IMPRESSION: 1. Chronic ischemic microangiopathy without acute intracranial abnormality. 2. No acute fracture of the cervical spine. 3. Posterior spinal fusion from C2-T1 with posterior decompression at C3-C6. No hardware adverse features. Electronically Signed   By: Ulyses Jarred M.D.   On: 10/19/2017 23:23   Dg Knee Complete 4 Views Left  Result Date: 10/19/2017 CLINICAL DATA:  Patient fell with injury to the left side. EXAM: LEFT KNEE - COMPLETE  4+ VIEW COMPARISON:  None. FINDINGS: Degenerative changes in the left knee with medial greater than lateral compartment narrowing and small osteophyte formation in all 3 compartments. Mild chondrocalcinosis. Suggestion of osteochondral defect in the medial femoral condyles. No evidence of acute fracture or dislocation. No focal bone lesion or bone destruction. No significant effusion. Vascular calcifications. IMPRESSION: Mild degenerative changes in the left knee with possible small osteochondral defect in the medial femoral condyle. No acute displaced fractures identified. Electronically Signed   By: Lucienne Capers M.D.   On: 10/19/2017 23:44   Dg Hip Unilat With Pelvis 2-3 Views Left  Result Date: 10/19/2017 CLINICAL DATA:  Patient fell with injury to the left side. EXAM: DG HIP (WITH OR WITHOUT PELVIS) 2-3V LEFT COMPARISON:  None. FINDINGS: Degenerative changes in the left hip with narrowing of the acetabular joint space and sclerosis along the superior acetabulum. Small osteophyte formation on the femoral head. No evidence of acute fracture or dislocation of the left hip. No focal bone lesion or bone destruction. Degenerative changes also noted in the lower lumbar spine and right hip.  SI joints and symphysis pubis are not displaced. Vascular calcifications. IMPRESSION: Mild degenerative changes in the left hip. No acute displaced fractures identified. Electronically Signed   By: Lucienne Capers M.D.   On: 10/19/2017 23:45    (Echo, Carotid, EGD, Colonoscopy, ERCP)    Subjective:   Discharge Exam: Vitals:   10/21/17 0915 10/21/17 1140  BP: 124/70 116/74  Pulse:  63  Resp:    Temp:  98 F (36.7 C)  SpO2:  91%   Vitals:   10/21/17 0442 10/21/17 0737 10/21/17 0915 10/21/17 1140  BP: (!) 118/59 130/64 124/70 116/74  Pulse: (!) 59   63  Resp: 19 15    Temp: 98 F (36.7 C) 98.2 F (36.8 C)  98 F (36.7 C)  TempSrc: Oral Oral  Oral  SpO2: 94%   91%  Weight: 82 kg (180 lb 11.2 oz)        General: Pt is alert, awake, not in acute distress Cardiovascular: RRR, S1/S2 +, no rubs, no gallops Respiratory: CTA bilaterally, no wheezing, no rhonchi Abdominal: Soft, NT, ND, bowel sounds + Extremities: no edema, no cyanosis    The results of significant diagnostics from this hospitalization (including imaging, microbiology, ancillary and laboratory) are listed below for reference.     Microbiology: No results found for this or any previous visit (from the past 240 hour(s)).   Labs: BNP (last 3 results) Recent Labs    08/18/17 1200  BNP 14.4   Basic Metabolic Panel: Recent Labs  Lab 10/19/17 2122 10/20/17 1410 10/21/17 0219  NA 137 140 140  K 4.4 4.2 3.8  CL 105 109 105  CO2 24 22 28   GLUCOSE 138* 95 87  BUN 20 18 18   CREATININE 1.43* 1.71* 1.54*  CALCIUM 9.3 8.6* 8.5*   Liver Function Tests: No results for input(s): AST, ALT, ALKPHOS, BILITOT, PROT, ALBUMIN in the last 168 hours. No results for input(s): LIPASE, AMYLASE in the last 168 hours. No results for input(s): AMMONIA in the last 168 hours. CBC: Recent Labs  Lab 10/19/17 2122 10/20/17 1410  WBC 6.4 5.5  NEUTROABS  --  3.0  HGB 14.6 13.5  HCT 43.0 41.2  MCV 97.9 98.8  PLT 202 186   Cardiac Enzymes: Recent Labs  Lab 10/20/17 0355 10/20/17 0755 10/20/17 1410 10/20/17 1902 10/21/17 0219  TROPONINI <0.03 0.12* <0.03 <0.03 <0.03   BNP: Invalid input(s): POCBNP CBG: No results for input(s): GLUCAP in the last 168 hours. D-Dimer No results for input(s): DDIMER in the last 72 hours. Hgb A1c No results for input(s): HGBA1C in the last 72 hours. Lipid Profile No results for input(s): CHOL, HDL, LDLCALC, TRIG, CHOLHDL, LDLDIRECT in the last 72 hours. Thyroid function studies No results for input(s): TSH, T4TOTAL, T3FREE, THYROIDAB in the last 72 hours.  Invalid input(s): FREET3 Anemia work up No results for input(s): VITAMINB12, FOLATE, FERRITIN, TIBC, IRON, RETICCTPCT in the last 72  hours. Urinalysis    Component Value Date/Time   COLORURINE YELLOW 02/02/2011 1025   APPEARANCEUR Clear 07/27/2017 0954   LABSPEC 1.021 02/02/2011 1025   PHURINE 6.5 02/02/2011 1025   GLUCOSEU Negative 07/27/2017 0954   HGBUR NEGATIVE 02/02/2011 1025   BILIRUBINUR Negative 07/27/2017 [place] 02/02/2011 1025   PROTEINUR Negative 07/27/2017 0954   PROTEINUR NEGATIVE 02/02/2011 1025   UROBILINOGEN 0.2 02/02/2011 1025   NITRITE Negative 07/27/2017 0954   NITRITE NEGATIVE 02/02/2011 1025   LEUKOCYTESUR Negative 07/27/2017 0954   Sepsis Labs Invalid  input(s): PROCALCITONIN,  WBC,  LACTICIDVEN Microbiology No results found for this or any previous visit (from the past 240 hour(s)).   Time coordinating discharge: Over 30 minutes  SIGNED:   Georgette Shell, MD  [facility] 10/21/2017, 3:57 PM Pager   If 7PM-7AM, please contact night-coverage [email or web address] Password [email or web address]

## 2017-10-21 NOTE — Progress Notes (Signed)
SATURATION QUALIFICATIONS: (This note is used to comply with regulatory documentation for home oxygen)  Patient Saturations on Room Air at Rest =94%  Patient Saturations on Room Air while Ambulating = 86%  Patient Saturations on 2 Liters of oxygen while Ambulating = 97%  Please briefly explain why patient needs home oxygen:

## 2017-10-22 DIAGNOSIS — I5032 Chronic diastolic (congestive) heart failure: Secondary | ICD-10-CM | POA: Diagnosis not present

## 2017-10-22 DIAGNOSIS — R0602 Shortness of breath: Secondary | ICD-10-CM | POA: Diagnosis not present

## 2017-10-23 ENCOUNTER — Telehealth: Payer: Self-pay | Admitting: Cardiovascular Disease

## 2017-10-23 ENCOUNTER — Telehealth: Payer: Self-pay

## 2017-10-23 ENCOUNTER — Other Ambulatory Visit: Payer: Self-pay | Admitting: *Deleted

## 2017-10-23 MED ORDER — PRAVASTATIN SODIUM 80 MG PO TABS: 80.0000 mg | ORAL_TABLET | Freq: Every day | ORAL | 1 refills | Status: DC

## 2017-10-23 NOTE — Telephone Encounter (Signed)
Please call about heart monitor details

## 2017-10-23 NOTE — Telephone Encounter (Addendum)
I have made the 1st attempt to contact the patient or family member in charge, in order to follow up from recently being discharged from the hospital. I left a message on voicemail but I will make another attempt at a different time.   Transition Care Management Follow-Up Telephone Call   Date discharged and where: [facility] on 10/21/2017  How have you been since you were released from the hospital? No weakness  Any patient concerns? None  Items Reviewed:   Meds: Y  Allergies: Y  Dietary Changes Reviewed:   Functional Questionnaire:  Independent-I Dependent-D  ADLs:   Dressing- I    Eating- I   Maintaining continence- I   Transferring- I w/ assist   Transportation- D   Meal Prep- D   Managing Meds- I  Confirmed importance and Date/Time of follow-up visits scheduled: Yes, Sherrie Mustache NP 10/27/2017 @ 10:14am   Confirmed with patient if condition worsens to call PCP or go to the Emergency Dept. Patient was given office number and encouraged to call back with questions or concerns: Yes

## 2017-10-23 NOTE — Progress Notes (Signed)
Duplicate entry see other telephone note.  

## 2017-10-23 NOTE — Telephone Encounter (Signed)
Spoke with patients daughter per release form and reviewed that we will enter his information and monitor company will call to verify shipping address. They will then ship the monitor to them and he should give [place] a call if they should have any questions regarding the monitor. She verbalized understanding with no further questions at this time.

## 2017-10-23 NOTE — Progress Notes (Signed)
Left voicemail message to call back regarding monitor ordered for patient.

## 2017-10-23 NOTE — Telephone Encounter (Signed)
Patient enrolled for monitor on provider site.

## 2017-10-23 NOTE — Telephone Encounter (Signed)
Patient requested Rx to be sent to pharmacy. Stated that it was increased to 80mg  and he has finished the 40 mg  Two tablets once daily and needs new dosage called to pharmacy. Faxed.

## 2017-10-23 NOTE — Telephone Encounter (Signed)
[facility] Gu[facility] called to let Dr. Mariea Clonts that that they were sending a RN to see the patient tomorrow.

## 2017-10-25 ENCOUNTER — Telehealth: Payer: Self-pay | Admitting: *Deleted

## 2017-10-25 DIAGNOSIS — Z9981 Dependence on supplemental oxygen: Secondary | ICD-10-CM | POA: Diagnosis not present

## 2017-10-25 DIAGNOSIS — I5032 Chronic diastolic (congestive) heart failure: Secondary | ICD-10-CM | POA: Diagnosis not present

## 2017-10-25 DIAGNOSIS — M1991 Primary osteoarthritis, unspecified site: Secondary | ICD-10-CM | POA: Diagnosis not present

## 2017-10-25 DIAGNOSIS — F329 Major depressive disorder, single episode, unspecified: Secondary | ICD-10-CM | POA: Diagnosis not present

## 2017-10-25 DIAGNOSIS — I480 Paroxysmal atrial fibrillation: Secondary | ICD-10-CM | POA: Diagnosis not present

## 2017-10-25 DIAGNOSIS — M5030 Other cervical disc degeneration, unspecified cervical region: Secondary | ICD-10-CM | POA: Diagnosis not present

## 2017-10-25 DIAGNOSIS — I82409 Acute embolism and thrombosis of unspecified deep veins of unspecified lower extremity: Secondary | ICD-10-CM | POA: Diagnosis not present

## 2017-10-25 DIAGNOSIS — E782 Mixed hyperlipidemia: Secondary | ICD-10-CM | POA: Diagnosis not present

## 2017-10-25 DIAGNOSIS — F419 Anxiety disorder, unspecified: Secondary | ICD-10-CM | POA: Diagnosis not present

## 2017-10-25 DIAGNOSIS — I130 Hypertensive heart and chronic kidney disease with heart failure and stage 1 through stage 4 chronic kidney disease, or unspecified chronic kidney disease: Secondary | ICD-10-CM | POA: Diagnosis not present

## 2017-10-25 DIAGNOSIS — G959 Disease of spinal cord, unspecified: Secondary | ICD-10-CM | POA: Diagnosis not present

## 2017-10-25 DIAGNOSIS — N183 Chronic kidney disease, stage 3 (moderate): Secondary | ICD-10-CM | POA: Diagnosis not present

## 2017-10-25 DIAGNOSIS — Z7901 Long term (current) use of anticoagulants: Secondary | ICD-10-CM | POA: Diagnosis not present

## 2017-10-25 LAB — CULTURE, BLOOD (ROUTINE X 2)
Culture: NO GROWTH
Culture: NO GROWTH
Special Requests: ADEQUATE

## 2017-10-25 NOTE — Telephone Encounter (Signed)
He can try zyrtec (cetirizine generic) to see if it helps.  10mg  po daily.

## 2017-10-25 NOTE — Telephone Encounter (Signed)
Patient called and stated that he has a Dry cough that keeps him up at night. Patient is taking all medications as directed. No congestion. No Fever.  Stated that the home health suggested he call his PCP to see what he could do about the Dry Cough. No other Symptoms. Is doing the Flonase and Protonix as directed. Please Advise.

## 2017-10-26 ENCOUNTER — Telehealth: Payer: Self-pay

## 2017-10-26 NOTE — Telephone Encounter (Signed)
Patient called c/o congestion along with dry cough (see phone note dated 10/25/17). Patient denies any discoloration or fever.   Patient would like to know what to take for congestion, patient states if we have samples of anything we he can take to give it to his daughter-in-law [name] Pemberton.  Patient aware he has appointment tomorrow.

## 2017-10-26 NOTE — Telephone Encounter (Signed)
Spoke with patient's daughter in law Clayton Bibles will advise patient of recommendations

## 2017-10-26 NOTE — Telephone Encounter (Signed)
Patient's daughter in law informed.

## 2017-10-26 NOTE — Telephone Encounter (Signed)
The zyrtec should also help with congestion.

## 2017-10-27 ENCOUNTER — Ambulatory Visit
Admission: RE | Admit: 2017-10-27 | Discharge: 2017-10-27 | Disposition: A | Discharge status: Home / Self Care | Payer: PPO | Source: Ambulatory Visit | Attending: Nurse Practitioner | Admitting: Nurse Practitioner

## 2017-10-27 ENCOUNTER — Ambulatory Visit: Payer: PPO | Admitting: Nurse Practitioner

## 2017-10-27 ENCOUNTER — Encounter: Payer: Self-pay | Admitting: Nurse Practitioner

## 2017-10-27 VITALS — BP 122/88 | HR 62 | Temp 97.7°F | Resp 19 | Ht 66.0 in | Wt 186.0 lb

## 2017-10-27 DIAGNOSIS — I5032 Chronic diastolic (congestive) heart failure: Secondary | ICD-10-CM

## 2017-10-27 DIAGNOSIS — R059 Cough, unspecified: Secondary | ICD-10-CM

## 2017-10-27 DIAGNOSIS — I480 Paroxysmal atrial fibrillation: Secondary | ICD-10-CM | POA: Diagnosis not present

## 2017-10-27 DIAGNOSIS — I10 Essential (primary) hypertension: Secondary | ICD-10-CM | POA: Diagnosis not present

## 2017-10-27 DIAGNOSIS — R0602 Shortness of breath: Secondary | ICD-10-CM

## 2017-10-27 DIAGNOSIS — R05 Cough: Secondary | ICD-10-CM | POA: Diagnosis not present

## 2017-10-27 MED ORDER — FLUTICASONE PROPIONATE 50 MCG/ACT NA SUSP: 2.0000 | Freq: Every day | NASAL | 10 refills | Status: DC

## 2017-10-27 NOTE — Patient Instructions (Addendum)
Go to [facility] for chest xray  Increase lasix to 60 mg by mouth twice daily for 3 days then resume lasix 40 mg twice daily Weight daily  Follow up next week.   Follow up with Cardiologist

## 2017-10-27 NOTE — Progress Notes (Signed)
Careteam: Patient Care Team: [name], DO as PCP - General (Geriatric Medicine) [name], MD as Consulting Physician (Cardiology) Normajean Glasgow, MD as Attending Physician (Physical Medicine and Rehabilitation) Phylliss Bob, MD as Consulting Physician (Orthopedic Surgery)  Advanced Directive information    Allergies  Allergen Reactions  . Morphine And Related Shortness Of Breath  . Percocet [Oxycodone-Acetaminophen] Shortness Of Breath  . Valium Shortness Of Breath    Chief Complaint  Patient presents with  . Transitions Of Care    Pt is being seen due to recent hosptial stay at [facility] 11/8 to 11/10 due to fall and chest pain. Pt has had SOB since home O2 was placed in the home 5 days ago.   . Other    Daughter in room      HPI: Patient is a 83 y.o. male seen in the office today follow up hospitalization.  malewith medical history significant ofPAFon Eliquis, diastolic CHF, HTN, HLD, DVT,andanxiety;who presents with complaints of passing out while bending over to feed the cat and woke up on the floor. Wife heard the patient fall and states that she found him on the floor on his back and noted to have chest pain.EMS initial blood pressure was noted to be 210/120 and he was in atrial fibrillation. He was given 324 mg of aspirin andsublingual nitroglycerin with some improvement in chest pain. Hewas also noted to haveconverted into normal sinus rhythm.  Cardiac workup was negative. No evidence of acute coronary syndrome.  While in hospital noted decrease O2 when walking and at night which required home health O2  Only using O2 at night, not using during the day.  Increase weakness.  Reports breathing and cough has been rough.  Increase in cough since Tuesday night.  Home health nurse came out to do an assessment.   ordinally diuretic was decreased but Sunday night he had more swelling.  Feels like he has weights on his feet.  Weight up 6 lbs today.    Does not eat food high in sodium More short of breath with activity and increase weakness, no chest pains.  Family has noticed a Rattling in chest.  Has been sleeping in the chair for over a year.   Review of Systems:  Review of Systems  Constitutional: Negative for chills, fever and malaise/fatigue.  HENT: Positive for hearing loss. Negative for congestion and sore throat.   Eyes: Negative for blurred vision.       Glasses  Respiratory: Positive for cough, shortness of breath and wheezing.   Cardiovascular: Positive for leg swelling. Negative for chest pain, palpitations, orthopnea, claudication and PND.  Gastrointestinal: Negative for abdominal pain, blood in stool, constipation and melena.  Musculoskeletal: Positive for falls.  Neurological: Negative for weakness.  Endo/Heme/Allergies: Bruises/bleeds easily.    Past Medical History:  Diagnosis Date  . Anxiety   . Arthritis   . Chronic diastolic CHF (congestive heart failure) ([place])    a. 01/2011 Echo: EF 50-55%, gr1 DD, mild AI, nl RV fxn, mild TR/PR.  . DDD (degenerative disc disease), cervical   . Depressive disorder, not elsewhere classified   . Depressive disorder, not elsewhere classified   . Difficult intubation   . DVT (deep venous thrombosis) ([place])   . Dysphagia, oral phase   . Dyspnea   . Edema   . History of kidney stones   . Hypertension   . Hypoxemia   . Impacted cerumen   . Mixed hyperlipidemia   .  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 Other myelopathy   . Pain in limb   . Palpitations   . Paroxysmal Atrial Fibrillation ([place])    a. a. 01/2011 in setting of post-op complications including aspiration pna;  b. CHA2DS2VASc = 4.  . Pneumonia 03/06/2003  . Spinal stenosis, unspecified region other than cervical   . 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 Thoracic or lumbosacral neuritis or radiculitis, unspecified    Past Surgical  History:  Procedure Laterality Date  . CARDIOVERSION N/A 06/20/2017   Performed by [name], MD at [facility] ORS  . CATARACT EXTRACTION W/ INTRAOCULAR LENS  IMPLANT, BILATERAL    . CERVICAL FUSION  02/10/2011  . history of abd ultrasound  11/01   fatty liver  . MULTIPLE TOOTH EXTRACTIONS    . OPEN REDUCTION INTERNAL FIXATION (ORIF) FIBULA FRACTURE DISTAL FIBULA Left 01/06/2017   Performed by Melrose Nakayama, MD at [facility] History:   reports that he has quit smoking. He quit after 1.00 year of use. he has never used smokeless tobacco. He reports that he does not drink alcohol or use drugs.  Family History  Problem Relation Age of Onset  . Stroke Father   . Atrial fibrillation Brother        on coumadin  . Heart disease Brother        AFib- coumadin  . Stroke Paternal Grandfather   . Cancer Other        colon cancer at early age  . Prostate cancer Neg Hx   . Kidney cancer Neg Hx   . Bladder Cancer Neg Hx     Medications:   Medication List        Accurate as of 10/27/17 10:29 AM. Always use your most recent med list.          amiodarone 200 MG tablet Commonly known as:  PACERONE Take 1 tablet (200 mg total) by mouth 2 (two) times daily.   apixaban 5 MG Tabs tablet Commonly known as:  ELIQUIS Take 1 tablet (5 mg total) by mouth 2 (two) times daily.   cetirizine 10 MG tablet Commonly known as:  ZYRTEC   fluticasone 50 MCG/ACT nasal spray Commonly known as:  FLONASE Place 2 sprays into both nostrils daily.   furosemide 40 MG tablet Commonly known as:  LASIX   gabapentin 600 MG tablet Commonly known as:  NEURONTIN   metoprolol tartrate 25 MG tablet Commonly known as:  LOPRESSOR Take 1 tablet (25 mg total) 2 (two) times daily by mouth.   pantoprazole 40 MG tablet Commonly known as:  PROTONIX Take 1 tablet (40 mg total) by mouth at bedtime.   potassium chloride SA 20 MEQ tablet Commonly known as:  K-DUR,KLOR-CON Take 0.5 tablets (10 mEq total) 2  (two) times daily by mouth.   pravastatin 80 MG tablet Commonly known as:  PRAVACHOL Take 1 tablet (80 mg total) daily by mouth.   Vitamin D 1000 units capsule Take one tablet once daily        Physical Exam:  Vitals:   10/27/17 1015  BP: 122/88  Pulse: 62  Resp: 19  Temp: 97.7 F (36.5 C)  TempSrc: Oral  SpO2: 95%  Weight: 186 lb (84.4 kg)  Height: 5' 6"  (1.676 m)   Body mass index is 30.02 kg/m.  Physical Exam  Constitutional: He is  oriented to person, place, and time. He appears well-developed and well-nourished.  HENT:  Head: Normocephalic and atraumatic.  HOH  Eyes:  glasses  Neck: Normal range of motion. Neck supple. No JVD present.  Cardiovascular: Normal rate, regular rhythm and normal heart sounds.  Pulmonary/Chest: Effort normal. No respiratory distress. He has rales (throughout bilateral lungs).  Increase shortness of breath with ambulation   Abdominal: Soft. Bowel sounds are normal. He exhibits no distension. There is no tenderness.  Musculoskeletal: He exhibits edema (2+ bilaterally).  Walks with walker  Neurological: He is alert and oriented to person, place, and time.  Skin: Skin is warm and dry.  Psychiatric: He has a normal mood and affect.    Labs reviewed: Basic Metabolic Panel: Recent Labs    03/29/17 1503  10/19/17 2122 10/20/17 1410 10/21/17 0219  NA 140   < > 137 140 140  K 4.6   < > 4.4 4.2 3.8  CL 104   < > 105 109 105  CO2 21   < > 24 22 28   GLUCOSE 80   < > 138* 95 87  BUN 27   < > 20 18 18   CREATININE 1.02   < > 1.43* 1.71* 1.54*  CALCIUM 9.5   < > 9.3 8.6* 8.5*  TSH 3.340  --   --   --   --    < > = values in this interval not displayed.   Liver Function Tests: Recent Labs    09/01/17 1015  AST 36*  ALT 39  BILITOT 0.7  PROT 7.0   No results for input(s): LIPASE, AMYLASE in the last 8760 hours. No results for input(s): AMMONIA in the last 8760 hours. CBC: Recent Labs    03/27/17 1530  06/16/17 0909  10/19/17 2122 10/20/17 1410  WBC 6.9   < > 6.1 6.4 5.5  NEUTROABS 3,588  --  3.1  --  3.0  HGB 14.2   < > 15.1 14.6 13.5  HCT 42.8   < > 44.2 43.0 41.2  MCV 97.3   < > 94.5 97.9 98.8  PLT 207   < > 205 202 186   < > = values in this interval not displayed.   Lipid Panel: Recent Labs    09/01/17 1015  CHOL 187  HDL 34*  TRIG 303*  CHOLHDL 5.5*   TSH: Recent Labs    03/29/17 1503  TSH 3.340   A1C: Lab Results  Component Value Date   HGBA1C 5.7 (H) 05/16/2016     Assessment/Plan 1. Cough and shortness of breath with activity Worse since hospitalization, suspect acute CHF due to decrease in diuretic during hospitalization.   - DG Chest 2 View rule out pneumonia  - CBC with Differential/Platelets  2. Chronic diastolic CHF (congestive heart failure) (HCC) Will increase lasix to 60 mg by mouth twice daily for 3 days then to decrease to 40 mg by mouth twice daily -weigh daily Limit sodium  - Brain Natriuretic Peptide - BMP with eGFR  3. HYPERTENSION, BENIGN ESSENTIAL -blood pressure stable, will cont current regimen  4. Paroxysmal atrial fibrillation (HCC) -SR at this time.  conts on amiodarone with lopressor twice daily and eliquis   5. Weakness Worsening weakness after hospitalization now with increased shortness of breath and cough.  Hopefully with increase in diuretic will help; Home health PT/OT has been consulted.    Next appt: 5 days to follow up cough, shortness of breath weight and lab work  Carlos American. Harle Battiest  [facility] [phone] 8 am - 5 pm) [phone] (after hours)

## 2017-10-28 LAB — CBC WITH DIFFERENTIAL/PLATELET
BASOS ABS: 48 {cells}/uL (ref 0–200)
Basophils Relative: 0.9 %
EOS ABS: 191 {cells}/uL (ref 15–500)
Eosinophils Relative: 3.6 %
HCT: 37.8 % — ABNORMAL LOW (ref 38.5–50.0)
Hemoglobin: 13 g/dL — ABNORMAL LOW (ref 13.2–17.1)
Lymphs Abs: 1659 cells/uL (ref 850–3900)
MCH: 32.7 pg (ref 27.0–33.0)
MCHC: 34.4 g/dL (ref 32.0–36.0)
MCV: 95.2 fL (ref 80.0–100.0)
MONOS PCT: 9.6 %
MPV: 10.1 fL (ref 7.5–12.5)
Neutro Abs: 2894 cells/uL (ref 1500–7800)
Neutrophils Relative %: 54.6 %
PLATELETS: 200 10*3/uL (ref 140–400)
RBC: 3.97 10*6/uL — ABNORMAL LOW (ref 4.20–5.80)
RDW: 12.2 % (ref 11.0–15.0)
TOTAL LYMPHOCYTE: 31.3 %
WBC mixed population: 509 cells/uL (ref 200–950)
WBC: 5.3 10*3/uL (ref 3.8–10.8)

## 2017-10-28 LAB — BASIC METABOLIC PANEL WITH GFR
BUN / CREAT RATIO: 21 (calc) (ref 6–22)
BUN: 27 mg/dL — ABNORMAL HIGH (ref 7–25)
CHLORIDE: 104 mmol/L (ref 98–110)
CO2: 32 mmol/L (ref 20–32)
CREATININE: 1.27 mg/dL — AB (ref 0.70–1.11)
Calcium: 9.1 mg/dL (ref 8.6–10.3)
GFR, Est African American: 60 mL/min/{1.73_m2} (ref >=60)
GFR, Est Non African American: 52 mL/min/{1.73_m2} — ABNORMAL LOW (ref >=60)
GLUCOSE: 89 mg/dL (ref 65–99)
POTASSIUM: 4.8 mmol/L (ref 3.5–5.3)
SODIUM: 141 mmol/L (ref 135–146)

## 2017-10-28 LAB — BRAIN NATRIURETIC PEPTIDE: Brain Natriuretic Peptide: 29 pg/mL (ref <100)

## 2017-10-30 DIAGNOSIS — F329 Major depressive disorder, single episode, unspecified: Secondary | ICD-10-CM | POA: Diagnosis not present

## 2017-10-30 DIAGNOSIS — I5032 Chronic diastolic (congestive) heart failure: Secondary | ICD-10-CM | POA: Diagnosis not present

## 2017-10-30 DIAGNOSIS — E782 Mixed hyperlipidemia: Secondary | ICD-10-CM | POA: Diagnosis not present

## 2017-10-30 DIAGNOSIS — G959 Disease of spinal cord, unspecified: Secondary | ICD-10-CM | POA: Diagnosis not present

## 2017-10-30 DIAGNOSIS — I130 Hypertensive heart and chronic kidney disease with heart failure and stage 1 through stage 4 chronic kidney disease, or unspecified chronic kidney disease: Secondary | ICD-10-CM | POA: Diagnosis not present

## 2017-10-30 DIAGNOSIS — F419 Anxiety disorder, unspecified: Secondary | ICD-10-CM | POA: Diagnosis not present

## 2017-10-30 DIAGNOSIS — I480 Paroxysmal atrial fibrillation: Secondary | ICD-10-CM | POA: Diagnosis not present

## 2017-10-30 DIAGNOSIS — N183 Chronic kidney disease, stage 3 (moderate): Secondary | ICD-10-CM | POA: Diagnosis not present

## 2017-10-30 DIAGNOSIS — Z7901 Long term (current) use of anticoagulants: Secondary | ICD-10-CM | POA: Diagnosis not present

## 2017-10-30 DIAGNOSIS — I82409 Acute embolism and thrombosis of unspecified deep veins of unspecified lower extremity: Secondary | ICD-10-CM | POA: Diagnosis not present

## 2017-10-30 DIAGNOSIS — M5030 Other cervical disc degeneration, unspecified cervical region: Secondary | ICD-10-CM | POA: Diagnosis not present

## 2017-10-30 DIAGNOSIS — M1991 Primary osteoarthritis, unspecified site: Secondary | ICD-10-CM | POA: Diagnosis not present

## 2017-10-30 DIAGNOSIS — Z9981 Dependence on supplemental oxygen: Secondary | ICD-10-CM | POA: Diagnosis not present

## 2017-10-31 ENCOUNTER — Ambulatory Visit (INDEPENDENT_AMBULATORY_CARE_PROVIDER_SITE_OTHER): Payer: PPO

## 2017-10-31 DIAGNOSIS — I480 Paroxysmal atrial fibrillation: Secondary | ICD-10-CM | POA: Diagnosis not present

## 2017-10-31 DIAGNOSIS — R55 Syncope and collapse: Secondary | ICD-10-CM | POA: Diagnosis not present

## 2017-11-01 ENCOUNTER — Encounter: Payer: Self-pay | Admitting: Nurse Practitioner

## 2017-11-01 ENCOUNTER — Ambulatory Visit: Payer: PPO | Admitting: Nurse Practitioner

## 2017-11-01 VITALS — BP 118/82 | HR 77 | Temp 98.3°F | Resp 17 | Ht 66.0 in | Wt 184.6 lb

## 2017-11-01 DIAGNOSIS — J40 Bronchitis, not specified as acute or chronic: Secondary | ICD-10-CM | POA: Diagnosis not present

## 2017-11-01 DIAGNOSIS — R531 Weakness: Secondary | ICD-10-CM

## 2017-11-01 DIAGNOSIS — M25572 Pain in left ankle and joints of left foot: Secondary | ICD-10-CM | POA: Diagnosis not present

## 2017-11-01 DIAGNOSIS — I5032 Chronic diastolic (congestive) heart failure: Secondary | ICD-10-CM

## 2017-11-01 DIAGNOSIS — H6121 Impacted cerumen, right ear: Secondary | ICD-10-CM

## 2017-11-01 DIAGNOSIS — R600 Localized edema: Secondary | ICD-10-CM

## 2017-11-01 MED ORDER — PANTOPRAZOLE SODIUM 40 MG PO TBEC: 40.0000 mg | DELAYED_RELEASE_TABLET | Freq: Every day | ORAL | 3 refills | Status: DC

## 2017-11-01 MED ORDER — CETIRIZINE HCL 10 MG PO TABS: 10.0000 mg | ORAL_TABLET | Freq: Every day | ORAL | 1 refills | Status: DC

## 2017-11-01 NOTE — Patient Instructions (Addendum)
[place] use MUCINEX DM By mouth twice daily with full glass of water as needed for cough and congestion  Elevate legs when sitting above level of heart is best Compression socks/hose when you first get out of bed is ideal for helping edema Limit sodium

## 2017-11-01 NOTE — Progress Notes (Signed)
Careteam: Patient Care Team: [name], DO as PCP - General (Geriatric Medicine) [name], MD as Consulting Physician (Cardiology) Normajean Glasgow, MD as Attending Physician (Physical Medicine and Rehabilitation) Phylliss Bob, MD as Consulting Physician (Orthopedic Surgery)  Advanced Directive information Does Patient Have a Medical Advance Directive?: Yes, Type of Advance Directive: [place];Living will, Does patient want to make changes to medical advance directive?: No - Patient declined  Allergies  Allergen Reactions  . Morphine And Related Shortness Of Breath  . Percocet [Oxycodone-Acetaminophen] Shortness Of Breath  . Valium Shortness Of Breath    Chief Complaint  Patient presents with  . Follow-up    Pt is being seen for a 1 week follow up for SOB and cough. Pt would like to have ears checked for blockage.   . Other    Daughter in room     HPI: Patient is a 83 y.o. male seen in the office today to follow up on shortness of breath and cough.  Pt was seen 10/27/17 for hospital follow up and noted to have new onset cough with congestion and weight gain ( 6 lbs) with edema. Lasix was increase. Chest xray was negative for acute CHF or pneumonia.  Cough has persisted. Has been taking zyrtec which he feels like is helping. conts to have congestion which has improved.  Home health nurse has been monitoring his O2 and been okay and he has been off O2 except at bedtime.   Went to orthopedic today, plan to call home health to give orders on what they want them to focus on for his PT. Ankle has healed but muscles are weak  Wearing his 30 day heart monitor. Following with cardiologist next month.  No palpitation or chest pains.   Review of Systems:  Review of Systems  Constitutional: Negative for chills, fever and malaise/fatigue.  HENT: Positive for hearing loss. Negative for congestion and sore throat.   Eyes: Negative for blurred vision.    Glasses  Respiratory: Positive for cough. Negative for shortness of breath and wheezing.   Cardiovascular: Positive for leg swelling. Negative for chest pain, palpitations, orthopnea, claudication and PND.  Gastrointestinal: Negative for abdominal pain, blood in stool, constipation and melena.  Musculoskeletal: Negative for falls and myalgias.  Neurological: Positive for weakness.  Endo/Heme/Allergies: Bruises/bleeds easily.    Past Medical History:  Diagnosis Date  . Anxiety   . Arthritis   . Chronic diastolic CHF (congestive heart failure) ([place])    a. 01/2011 Echo: EF 50-55%, gr1 DD, mild AI, nl RV fxn, mild TR/PR.  . DDD (degenerative disc disease), cervical   . Depressive disorder, not elsewhere classified   . Depressive disorder, not elsewhere classified   . Difficult intubation   . DVT (deep venous thrombosis) ([place])   . Dysphagia, oral phase   . Dyspnea   . Edema   . History of kidney stones   . Hypertension   . Hypoxemia   . Impacted cerumen   . Mixed hyperlipidemia   . 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 Other myelopathy   . Pain in limb   . Palpitations   . Paroxysmal Atrial Fibrillation ([place])    a. a. 01/2011 in setting of post-op complications including aspiration pna;  b. CHA2DS2VASc = 4.  . Pneumonia 03/06/2003  . Spinal stenosis, unspecified region other than cervical   . Squamous cell carcinoma of skin of trunk, except scrotum  skin cancer of shoulder  . Thoracic or lumbosacral neuritis or radiculitis, unspecified    Past Surgical History:  Procedure Laterality Date  . CARDIOVERSION N/A 06/20/2017   Procedure: CARDIOVERSION;  Surgeon: [name], MD;  Location: [facility] ORS;  Service: Cardiovascular;  Laterality: N/A;  . CATARACT EXTRACTION W/ INTRAOCULAR LENS  IMPLANT, BILATERAL    . CERVICAL FUSION  02/10/2011  . history of abd ultrasound  11/01   fatty liver  . MULTIPLE TOOTH EXTRACTIONS    . ORIF FIBULA FRACTURE Left  01/06/2017   Procedure: OPEN REDUCTION INTERNAL FIXATION (ORIF) FIBULA FRACTURE DISTAL FIBULA;  Surgeon: Melrose Nakayama, MD;  Location: [place];  Service: Orthopedics;  Laterality: Left;  Patient states has problems if he will have a tube in throat for Genera; Anesthesia   Social History:   reports that he has quit smoking. He quit after 1.00 year of use. he has never used smokeless tobacco. He reports that he does not drink alcohol or use drugs.  Family History  Problem Relation Age of Onset  . Stroke Father   . Atrial fibrillation Brother        on coumadin  . Heart disease Brother        AFib- coumadin  . Stroke Paternal Grandfather   . Cancer Other        colon cancer at early age  . Prostate cancer Neg Hx   . Kidney cancer Neg Hx   . Bladder Cancer Neg Hx     Medications:   Medication List        Accurate as of 11/01/17  1:16 PM. Always use your most recent med list.          amiodarone 200 MG tablet Commonly known as:  PACERONE Take 1 tablet (200 mg total) by mouth 2 (two) times daily.   apixaban 5 MG Tabs tablet Commonly known as:  ELIQUIS Take 1 tablet (5 mg total) by mouth 2 (two) times daily.   cetirizine 10 MG tablet Commonly known as:  ZYRTEC   fluticasone 50 MCG/ACT nasal spray Commonly known as:  FLONASE Place 2 sprays daily into both nostrils.   furosemide 40 MG tablet Commonly known as:  LASIX   gabapentin 600 MG tablet Commonly known as:  NEURONTIN   metoprolol tartrate 25 MG tablet Commonly known as:  LOPRESSOR Take 1 tablet (25 mg total) 2 (two) times daily by mouth.   pantoprazole 40 MG tablet Commonly known as:  PROTONIX Take 1 tablet (40 mg total) by mouth at bedtime.   potassium chloride SA 20 MEQ tablet Commonly known as:  K-DUR,KLOR-CON Take 0.5 tablets (10 mEq total) 2 (two) times daily by mouth.   pravastatin 80 MG tablet Commonly known as:  PRAVACHOL Take 1 tablet (80 mg total) daily by mouth.   Vitamin D 1000 units  capsule Take one tablet once daily        Physical Exam:  Vitals:   11/01/17 1310  BP: 118/82  Pulse: 77  Resp: 17  Temp: 98.3 F (36.8 C)  TempSrc: Oral  SpO2: 95%  Weight: 184 lb 9.6 oz (83.7 kg)  Height: 5\' 6"  (1.676 m)   Body mass index is 29.8 kg/m.  Physical Exam  Constitutional: He is oriented to person, place, and time. He appears well-developed and well-nourished.  HENT:  HOH Cerumen impaction right right  Eyes:  glasses  Neck: Normal range of motion. No JVD present.  Cardiovascular: Normal rate, regular rhythm and normal  heart sounds.  Pulmonary/Chest: Effort normal and breath sounds normal. He has no rales.  Abdominal: Soft. Bowel sounds are normal. He exhibits no distension. There is no tenderness.  Musculoskeletal: He exhibits edema (1+ bilaterally).  Uses rolling walker  Neurological: He is alert and oriented to person, place, and time.  Skin: Skin is warm and dry.  Psychiatric: He has a normal mood and affect.    Labs reviewed: Basic Metabolic Panel: Recent Labs    03/29/17 1503  10/20/17 1410 10/21/17 0219 10/27/17 1055  NA 140   < > 140 140 141  K 4.6   < > 4.2 3.8 4.8  CL 104   < > 109 105 104  CO2 21   < > 22 28 32  GLUCOSE 80   < > 95 87 89  BUN 27   < > 18 18 27*  CREATININE 1.02   < > 1.71* 1.54* 1.27*  CALCIUM 9.5   < > 8.6* 8.5* 9.1  TSH 3.340  --   --   --   --    < > = values in this interval not displayed.   Liver Function Tests: Recent Labs    09/01/17 1015  AST 36*  ALT 39  BILITOT 0.7  PROT 7.0   No results for input(s): LIPASE, AMYLASE in the last 8760 hours. No results for input(s): AMMONIA in the last 8760 hours. CBC: Recent Labs    06/16/17 0909 10/19/17 2122 10/20/17 1410 10/27/17 1055  WBC 6.1 6.4 5.5 5.3  NEUTROABS 3.1  --  3.0 2,894  HGB 15.1 14.6 13.5 13.0*  HCT 44.2 43.0 41.2 37.8*  MCV 94.5 97.9 98.8 95.2  PLT 205 202 186 200   Lipid Panel: Recent Labs    09/01/17 1015  CHOL 187  HDL  34*  TRIG 303*  CHOLHDL 5.5*   TSH: Recent Labs    03/29/17 1503  TSH 3.340   A1C: Lab Results  Component Value Date   HGBA1C 5.7 (H) 05/16/2016     Assessment/Plan 1. Bronchitis Cough and congestion improving. Lung sounds have improved and he is feeling better. [place] use mucinex DM by mouth twice daily with full glass of water as needed for cough and congestion.   2. Chronic diastolic CHF (congestive heart failure) (HCC) Stable, BNP last week at 29, cont current lasix regimen.    3. Lower leg edema Improved, unable to use compression hose bc he can not apply them; this would provide benefit and could try compression socks with less but some compression. Limit sodium intake and elevate legs when sitting.  4. Weakness Improving slightly since hospitalization however has not had PT yet to holiday week but plans to start soon  5. Impacted cerumen of right ear Ear flushed with cerumen that was removed however still with some left in canal however very tender therefore did not use curette due to discomfort.  -[place] use debrox BID for 4 days and make follow up appt for evaluation  Next appt: as scheduled, sooner if needed Saraih Lorton K. Harle Battiest  [facility] [phone] 8 am - 5 pm) [phone] (after hours)

## 2017-11-04 DIAGNOSIS — F329 Major depressive disorder, single episode, unspecified: Secondary | ICD-10-CM | POA: Diagnosis not present

## 2017-11-04 DIAGNOSIS — I130 Hypertensive heart and chronic kidney disease with heart failure and stage 1 through stage 4 chronic kidney disease, or unspecified chronic kidney disease: Secondary | ICD-10-CM | POA: Diagnosis not present

## 2017-11-04 DIAGNOSIS — F419 Anxiety disorder, unspecified: Secondary | ICD-10-CM | POA: Diagnosis not present

## 2017-11-04 DIAGNOSIS — I480 Paroxysmal atrial fibrillation: Secondary | ICD-10-CM | POA: Diagnosis not present

## 2017-11-04 DIAGNOSIS — Z7901 Long term (current) use of anticoagulants: Secondary | ICD-10-CM | POA: Diagnosis not present

## 2017-11-04 DIAGNOSIS — E782 Mixed hyperlipidemia: Secondary | ICD-10-CM | POA: Diagnosis not present

## 2017-11-04 DIAGNOSIS — I5032 Chronic diastolic (congestive) heart failure: Secondary | ICD-10-CM | POA: Diagnosis not present

## 2017-11-04 DIAGNOSIS — G959 Disease of spinal cord, unspecified: Secondary | ICD-10-CM | POA: Diagnosis not present

## 2017-11-04 DIAGNOSIS — Z9981 Dependence on supplemental oxygen: Secondary | ICD-10-CM | POA: Diagnosis not present

## 2017-11-04 DIAGNOSIS — M1991 Primary osteoarthritis, unspecified site: Secondary | ICD-10-CM | POA: Diagnosis not present

## 2017-11-04 DIAGNOSIS — M5030 Other cervical disc degeneration, unspecified cervical region: Secondary | ICD-10-CM | POA: Diagnosis not present

## 2017-11-04 DIAGNOSIS — I82409 Acute embolism and thrombosis of unspecified deep veins of unspecified lower extremity: Secondary | ICD-10-CM | POA: Diagnosis not present

## 2017-11-04 DIAGNOSIS — N183 Chronic kidney disease, stage 3 (moderate): Secondary | ICD-10-CM | POA: Diagnosis not present

## 2017-11-07 DIAGNOSIS — M25572 Pain in left ankle and joints of left foot: Secondary | ICD-10-CM | POA: Diagnosis not present

## 2017-11-12 ENCOUNTER — Encounter: Payer: Self-pay | Admitting: Radiology

## 2017-11-12 ENCOUNTER — Emergency Department: Payer: PPO

## 2017-11-12 ENCOUNTER — Emergency Department
Admission: EM | Admit: 2017-11-12 | Discharge: 2017-11-13 | Disposition: A | Discharge status: Home / Self Care | Payer: PPO | Attending: Emergency Medicine | Admitting: Emergency Medicine

## 2017-11-12 ENCOUNTER — Other Ambulatory Visit: Payer: Self-pay

## 2017-11-12 DIAGNOSIS — R1110 Vomiting, unspecified: Secondary | ICD-10-CM | POA: Diagnosis not present

## 2017-11-12 DIAGNOSIS — I110 Hypertensive heart disease with heart failure: Secondary | ICD-10-CM | POA: Diagnosis not present

## 2017-11-12 DIAGNOSIS — I5032 Chronic diastolic (congestive) heart failure: Secondary | ICD-10-CM | POA: Insufficient documentation

## 2017-11-12 DIAGNOSIS — R1013 Epigastric pain: Secondary | ICD-10-CM

## 2017-11-12 DIAGNOSIS — Z87891 Personal history of nicotine dependence: Secondary | ICD-10-CM | POA: Diagnosis not present

## 2017-11-12 DIAGNOSIS — Z79899 Other long term (current) drug therapy: Secondary | ICD-10-CM | POA: Diagnosis not present

## 2017-11-12 DIAGNOSIS — Z7901 Long term (current) use of anticoagulants: Secondary | ICD-10-CM | POA: Insufficient documentation

## 2017-11-12 DIAGNOSIS — K8020 Calculus of gallbladder without cholecystitis without obstruction: Secondary | ICD-10-CM | POA: Diagnosis not present

## 2017-11-12 DIAGNOSIS — R079 Chest pain, unspecified: Secondary | ICD-10-CM | POA: Diagnosis not present

## 2017-11-12 LAB — CBC
HCT: 42.5 % (ref 40.0–52.0)
Hemoglobin: 14.3 g/dL (ref 13.0–18.0)
MCH: 32.8 pg (ref 26.0–34.0)
MCHC: 33.6 g/dL (ref 32.0–36.0)
MCV: 97.4 fL (ref 80.0–100.0)
PLATELETS: 251 10*3/uL (ref 150–440)
RBC: 4.36 MIL/uL — AB (ref 4.40–5.90)
RDW: 13.2 % (ref 11.5–14.5)
WBC: 6.6 10*3/uL (ref 3.8–10.6)

## 2017-11-12 LAB — BASIC METABOLIC PANEL
ANION GAP: 11 (ref 5–15)
BUN: 21 mg/dL — ABNORMAL HIGH (ref 6–20)
CALCIUM: 9.2 mg/dL (ref 8.9–10.3)
CO2: 26 mmol/L (ref 22–32)
CREATININE: 1.32 mg/dL — AB (ref 0.61–1.24)
Chloride: 104 mmol/L (ref 101–111)
GFR, EST AFRICAN AMERICAN: 56 mL/min — AB (ref >60)
GFR, EST NON AFRICAN AMERICAN: 48 mL/min — AB (ref >60)
Glucose, Bld: 99 mg/dL (ref 65–99)
Potassium: 4 mmol/L (ref 3.5–5.1)
SODIUM: 141 mmol/L (ref 135–145)

## 2017-11-12 LAB — HEPATIC FUNCTION PANEL
ALBUMIN: 4 g/dL (ref 3.5–5.0)
ALT: 46 U/L (ref 17–63)
AST: 47 U/L — AB (ref 15–41)
Alkaline Phosphatase: 55 U/L (ref 38–126)
TOTAL PROTEIN: 7.7 g/dL (ref 6.5–8.1)
Total Bilirubin: 0.6 mg/dL (ref 0.3–1.2)

## 2017-11-12 LAB — TROPONIN I

## 2017-11-12 LAB — LIPASE, BLOOD: Lipase: 38 U/L (ref 11–51)

## 2017-11-12 MED ORDER — IOPAMIDOL (ISOVUE-300) INJECTION 61%
100.0000 mL | Freq: Once | INTRAVENOUS | Status: AC | PRN
  Administered 2017-11-12: 100 mL via INTRAVENOUS

## 2017-11-12 MED ORDER — SODIUM CHLORIDE 0.9 % IV BOLUS (SEPSIS)
500.0000 mL | Freq: Once | INTRAVENOUS | Status: AC
  Administered 2017-11-12: 500 mL via INTRAVENOUS

## 2017-11-12 MED ORDER — ONDANSETRON HCL 4 MG/2ML IJ SOLN
INTRAMUSCULAR | Status: AC
  Administered 2017-11-12: 4 mg via INTRAVENOUS
  Filled 2017-11-12: qty 2

## 2017-11-12 MED ORDER — ONDANSETRON HCL 4 MG/2ML IJ SOLN
4.0000 mg | Freq: Once | INTRAMUSCULAR | Status: AC
  Administered 2017-11-12: 4 mg via INTRAVENOUS

## 2017-11-12 MED ORDER — IOPAMIDOL (ISOVUE-300) INJECTION 61%
30.0000 mL | Freq: Once | INTRAVENOUS | Status: AC | PRN
  Administered 2017-11-12: 30 mL via ORAL

## 2017-11-12 NOTE — ED Notes (Signed)
Pt assisted out of car to wheelchair; pt c/o sharp pain to the center of his chest; pt straight to triage for EKG

## 2017-11-12 NOTE — ED Triage Notes (Signed)
Pt states that he started having chest pain for the past hour, pt wearing a heart monitor and describes the pain as sharp, pt reports that he vomited prior to arrival.

## 2017-11-12 NOTE — ED Provider Notes (Signed)
[facility] Emergency Department Provider Note ____________________________________________   First MD Initiated Contact with Patient 11/12/17 2123     (approximate)  I have reviewed the triage vital signs and the nursing notes.   HISTORY  Chief Complaint Chest Pain    HPI Eric [name] is a 83 y.o. male with past medical history as noted below who presents with epigastric pain, acute onset 2 hours ago, associated with one episode of nonbloody vomiting, and now still present but slightly improved.  Patient denies any prior history of this pain.  He states pain is somewhat sharp, and nonradiating although he also feels some pain in his right arm.  He denies any associated shortness of breath, and states the nausea is resolved.   Past Medical History:  Diagnosis Date  . Anxiety   . Arthritis   . Chronic diastolic CHF (congestive heart failure) ([place])    a. 01/2011 Echo: EF 50-55%, gr1 DD, mild AI, nl RV fxn, mild TR/PR.  . DDD (degenerative disc disease), cervical   . Depressive disorder, not elsewhere classified   . Depressive disorder, not elsewhere classified   . Difficult intubation   . DVT (deep venous thrombosis) ([place])   . Dysphagia, oral phase   . Dyspnea   . Edema   . History of kidney stones   . Hypertension   . Hypoxemia   . Impacted cerumen   . Mixed hyperlipidemia   . 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 Other myelopathy   . Pain in limb   . Palpitations   . Paroxysmal Atrial Fibrillation ([place])    a. a. 01/2011 in setting of post-op complications including aspiration pna;  b. CHA2DS2VASc = 4.  . Pneumonia 03/06/2003  . Spinal stenosis, unspecified region other than cervical   . 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 Thoracic or lumbosacral neuritis or radiculitis, unspecified     Patient Active Problem List   Diagnosis Date Noted  . Chest pain 10/20/2017  .  Syncope and collapse 10/20/2017  . S/P ORIF (open reduction internal fixation) fracture 03/07/2017  . Lower extremity edema 03/07/2017  . Chronic diastolic CHF (congestive heart failure) ([place]) 01/19/2015  . Hyperlipidemia, mixed 06/28/2013  . Cervical myelopathy with cervical radiculopathy 06/28/2013  . Cataract of both eyes 06/28/2013  . Anemia of chronic disease 04/14/2013  . Spondylosis, cervical, with myelopathy   . Spinal stenosis, lumbar region, with neurogenic claudication   . DDD (degenerative disc disease), cervical   . PAF (paroxysmal atrial fibrillation) ([place])   . Hypertension   . Dizziness 11/09/2011  . Edema 05/18/2011  . Long term current use of anticoagulant 03/02/2011  . VITAMIN D DEFICIENCY 11/29/2010  . ANXIETY STATE, UNSPECIFIED 07/16/2009  . LOW BACK PAIN SYNDROME 03/17/2009  . NONTRAUMATIC RUPTURE OF TENDONS OF BICEPS 12/01/2008  . ERECTILE DYSFUNCTION, ORGANIC 12/10/2007  . Hyperlipidemia 05/28/2007  . SEXUAL DYSFUNCTION 05/28/2007  . HYPERTENSION, BENIGN ESSENTIAL 05/28/2007  . Atrial fibrillation ([place]) 05/28/2007  . FATTY LIVER DISEASE- ABD. U/S 11/01, MILD ELEVATED LFT'S 05/28/2007    Past Surgical History:  Procedure Laterality Date  . CARDIOVERSION N/A 06/20/2017   Procedure: CARDIOVERSION;  Surgeon: [name], MD;  Location: [facility] ORS;  Service: Cardiovascular;  Laterality: N/A;  . CATARACT EXTRACTION W/ INTRAOCULAR LENS  IMPLANT, BILATERAL    . CERVICAL FUSION  02/10/2011  . history of abd ultrasound  11/01   fatty liver  . MULTIPLE TOOTH EXTRACTIONS    . ORIF FIBULA FRACTURE Left 01/06/2017   Procedure: OPEN REDUCTION INTERNAL FIXATION (ORIF) FIBULA FRACTURE DISTAL FIBULA;  Surgeon: Melrose Nakayama, MD;  Location: [facility];  Service: Orthopedics;  Laterality: Left;  Patient states has problems if he will have a tube in throat for Genera; Anesthesia    Prior to Admission medications   Medication Sig Start Date End Date Taking? Authorizing Provider   amiodarone (PACERONE) 200 MG tablet Take 1 tablet (200 mg total) by mouth 2 (two) times daily. 05/18/17  Yes Gollan, Kathlene November, MD  apixaban (ELIQUIS) 5 MG TABS tablet Take 1 tablet (5 mg total) by mouth 2 (two) times daily. 04/24/17  Yes [name], MD  cetirizine (ZYRTEC) 10 MG tablet Take 1 tablet (10 mg total) by mouth daily. 11/01/17  Yes Lauree Chandler, NP  Cholecalciferol (VITAMIN D) 1000 UNITS capsule Take one tablet once daily 08/09/13  Yes Reed, [name] L, DO  fluticasone (FLONASE) 50 MCG/ACT nasal spray Place 2 sprays daily into both nostrils. 10/27/17 10/27/18 Yes Lauree Chandler, NP  furosemide (LASIX) 40 MG tablet Take 40 mg 2 (two) times daily by mouth.   Yes [provider]  gabapentin (NEURONTIN) 600 MG tablet Take 300 mg 2 (two) times daily by mouth.   Yes [provider]  metoprolol tartrate (LOPRESSOR) 25 MG tablet Take 1 tablet (25 mg total) 2 (two) times daily by mouth. 10/21/17 10/21/18 Yes Georgette Shell, MD  pantoprazole (PROTONIX) 40 MG tablet Take 1 tablet (40 mg total) by mouth at bedtime. 11/01/17  Yes Lauree Chandler, NP  potassium chloride SA (K-DUR,KLOR-CON) 20 MEQ tablet Take 0.5 tablets (10 mEq total) 2 (two) times daily by mouth. 10/21/17  Yes Georgette Shell, MD  pravastatin (PRAVACHOL) 80 MG tablet Take 1 tablet (80 mg total) daily by mouth. 10/23/17  Yes Reed, [name] L, DO  traMADol (ULTRAM) 50 MG tablet Take 50 mg by mouth at bedtime as needed for pain.   Yes [provider]    Allergies Morphine and related; Percocet [oxycodone-acetaminophen]; and Valium  Family History  Problem Relation Age of Onset  . Stroke Father   . Atrial fibrillation Brother        on coumadin  . Heart disease Brother        AFib- coumadin  . Stroke Paternal Grandfather   . Cancer Other        colon cancer at early age  . Prostate cancer Neg Hx   . Kidney cancer Neg Hx   . Bladder Cancer Neg Hx     Social  History Social History   Tobacco Use  . Smoking status: Former Smoker    Years: 1.00  . Smokeless tobacco: Never Used  . Tobacco comment: stopped in 20's  Substance Use Topics  . Alcohol use: No  . Drug use: No    Review of Systems  Constitutional: No fever/chills.  Eyes: No visual changes. ENT: No sore throat. Cardiovascular: Positive for chest pain. Respiratory: Denies shortness of breath. Gastrointestinal: Positive for resolved nausea and vomiting.  Genitourinary: Negative for dysuria.  Musculoskeletal: Negative for back pain. Skin: Negative for rash. Neurological: Negative for headaches, focal weakness or numbness.   ____________________________________________   PHYSICAL EXAM:  VITAL SIGNS: ED Triage Vitals  Enc Vitals Group     BP 11/12/17 2024 (!) 160/94     Pulse Rate 11/12/17 2024 73  Resp 11/12/17 2024 18     Temp 11/12/17 2024 98.2 F (36.8 C)     Temp Source 11/12/17 2024 Oral     SpO2 11/12/17 2024 96 %     Weight 11/12/17 2025 180 lb (81.6 kg)     Height 11/12/17 2025 5\' 6"  (1.676 m)     Head Circumference --      Peak Flow --      Pain Score 11/12/17 2024 5     Pain Loc --      Pain Edu? --      Excl. in [place]? --     Constitutional: Alert and oriented.  Relatively comfortable appearing and in no acute distress. Eyes: Conjunctivae are normal.  Head: Atraumatic. Nose: No congestion/rhinnorhea. Mouth/Throat: Mucous membranes are moist.   Neck: Normal range of motion.  Cardiovascular: Normal rate, regular rhythm. Grossly normal heart sounds.  Good peripheral circulation. Respiratory: Normal respiratory effort.  No retractions. Lungs CTAB. Gastrointestinal: Soft with moderate epigastric and mild left upper quadrant tenderness. No distention.  Genitourinary: No CVA tenderness. Musculoskeletal: 1+ bilateral lower extremity edema.  Extremities warm and well perfused.  Neurologic:  Normal speech and language. No gross focal neurologic deficits are  appreciated.  Skin:  Skin is warm and dry. No rash noted. Psychiatric: Mood and affect are normal. Speech and behavior are normal.  ____________________________________________   LABS (all labs ordered are listed, but only abnormal results are displayed)  Labs Reviewed  BASIC METABOLIC PANEL - Abnormal; Notable for the following components:      Result Value   BUN 21 (*)    Creatinine, Ser 1.32 (*)    GFR calc non Af Amer 48 (*)    GFR calc Af Amer 56 (*)    All other components within normal limits  CBC - Abnormal; Notable for the following components:   RBC 4.36 (*)    All other components within normal limits  HEPATIC FUNCTION PANEL - Abnormal; Notable for the following components:   AST 47 (*)    Bilirubin, Direct <0.1 (*)    All other components within normal limits  TROPONIN I  LIPASE, BLOOD  TROPONIN I   ____________________________________________  EKG  ED ECG REPORT I, Arta Silence, the attending physician, personally viewed and interpreted this ECG.  Date: 11/12/2017 EKG Time: 2024 Rate: 66 Rhythm: Atrial fibrillation (read by machine as accelerated junctional rhythm) QRS Axis: normal Intervals: normal ST/T Wave abnormalities: Nonspecific ST/T abnormalities Narrative Interpretation: no evidence of acute ischemia; no significant change when compared to EKG of 10/24/2017  ____________________________________________  RADIOLOGY  CXR: no focal infiltrate or other acute finding CT abd: pending ____________________________________________   PROCEDURES  Procedure(s) performed: No    Critical Care performed: No ____________________________________________   INITIAL IMPRESSION / ASSESSMENT AND PLAN / ED COURSE  Pertinent labs & imaging results that were available during my care of the patient were reviewed by me and considered in my medical decision making (see chart for details).  83 year old male with past medical history as noted below  including paroxysmal atrial fibrillation on Eliquis, diastolic CHF, hypertension, hyperlipidemia, and DVT presents with primarily epigastric pain and some chest pain associated with one episode of vomiting is now resolved.  Patient reports mild persistent pain.  Review of past medical records in [place] reveals a recent admission earlier this month for syncope.  Otherwise noncontributory.  On exam, patient is relatively well-appearing for age, his vital signs are within normal limits, and the exam is  primarily significant for epigastric and some left-sided abdominal tenderness.  The abdomen appears slightly distended to me, however patient and his family member state that it is always somewhat distended and firm.  He has no prior abdominal surgeries.  Initial lab workup is negative.  EKG is nonischemic.  I am less concerned for cardiac cause given the primarily epigastric nature of the pain, and negative workup so far.  Will obtain a repeat troponin to rule out ACS, however I will also add hepatobiliary labs, and we will obtain a CT to rule out colitis, obstruction, or other intra-abdominal cause.  If the lab workup and CT are negative, then likely differential is gastritis or PUD.  If the workup is fully negative and patient symptoms are resolved, he Egge be able to go home.    ----------------------------------------- 11:24 PM on 11/12/2017 -----------------------------------------  Patient signed out to the oncoming physician Dr. Archie Balboa.  Plan for CT, and repeat troponin.  Dispo based on results of workup.  If patient's symptoms improve and the workup is negative, possible discharge home.  ____________________________________________   FINAL CLINICAL IMPRESSION(S) / ED DIAGNOSES  Final diagnoses:  Epigastric pain      NEW MEDICATIONS STARTED DURING THIS VISIT:  This SmartLink is deprecated. Use AVSMEDLIST instead to display the medication list for a patient.   Note:  This document  was prepared using Dragon voice recognition software and [place] include unintentional dictation errors.    Arta Silence, MD 11/12/17 2325

## 2017-11-13 DIAGNOSIS — G959 Disease of spinal cord, unspecified: Secondary | ICD-10-CM | POA: Diagnosis not present

## 2017-11-13 DIAGNOSIS — N183 Chronic kidney disease, stage 3 (moderate): Secondary | ICD-10-CM | POA: Diagnosis not present

## 2017-11-13 DIAGNOSIS — F419 Anxiety disorder, unspecified: Secondary | ICD-10-CM | POA: Diagnosis not present

## 2017-11-13 DIAGNOSIS — Z7901 Long term (current) use of anticoagulants: Secondary | ICD-10-CM | POA: Diagnosis not present

## 2017-11-13 DIAGNOSIS — E782 Mixed hyperlipidemia: Secondary | ICD-10-CM | POA: Diagnosis not present

## 2017-11-13 DIAGNOSIS — F329 Major depressive disorder, single episode, unspecified: Secondary | ICD-10-CM | POA: Diagnosis not present

## 2017-11-13 DIAGNOSIS — I480 Paroxysmal atrial fibrillation: Secondary | ICD-10-CM | POA: Diagnosis not present

## 2017-11-13 DIAGNOSIS — M1991 Primary osteoarthritis, unspecified site: Secondary | ICD-10-CM | POA: Diagnosis not present

## 2017-11-13 DIAGNOSIS — Z9981 Dependence on supplemental oxygen: Secondary | ICD-10-CM | POA: Diagnosis not present

## 2017-11-13 DIAGNOSIS — I5032 Chronic diastolic (congestive) heart failure: Secondary | ICD-10-CM | POA: Diagnosis not present

## 2017-11-13 DIAGNOSIS — M5030 Other cervical disc degeneration, unspecified cervical region: Secondary | ICD-10-CM | POA: Diagnosis not present

## 2017-11-13 DIAGNOSIS — I82409 Acute embolism and thrombosis of unspecified deep veins of unspecified lower extremity: Secondary | ICD-10-CM | POA: Diagnosis not present

## 2017-11-13 DIAGNOSIS — I130 Hypertensive heart and chronic kidney disease with heart failure and stage 1 through stage 4 chronic kidney disease, or unspecified chronic kidney disease: Secondary | ICD-10-CM | POA: Diagnosis not present

## 2017-11-13 LAB — TROPONIN I: Troponin I: <0.03 ng/mL (ref <0.03)

## 2017-11-13 NOTE — ED Provider Notes (Signed)
2nd troponin negative. Patient's ct returned without obvious etiology of the patient's pain. Did show gallstones. Blood work however without leukocytosis or concerning hepatic function panel. Given that patient's pain has resolved doubt cholecystitis. Did however discuss this finding with the patient. Discussed importance of follow up with primary care physician.   [name], MD 11/13/17 [phone]

## 2017-11-13 NOTE — Discharge Instructions (Signed)
Please seek medical attention for any high fevers, chest pain, shortness of breath, change in behavior, persistent vomiting, bloody stool or any other new or concerning symptoms.  

## 2017-11-16 ENCOUNTER — Ambulatory Visit: Payer: PPO | Admitting: Nurse Practitioner

## 2017-11-16 ENCOUNTER — Encounter: Payer: Self-pay | Admitting: Nurse Practitioner

## 2017-11-16 VITALS — BP 132/76 | HR 66 | Temp 98.2°F | Resp 17 | Ht 66.0 in | Wt 182.6 lb

## 2017-11-16 DIAGNOSIS — I5032 Chronic diastolic (congestive) heart failure: Secondary | ICD-10-CM

## 2017-11-16 DIAGNOSIS — R531 Weakness: Secondary | ICD-10-CM

## 2017-11-16 DIAGNOSIS — K8020 Calculus of gallbladder without cholecystitis without obstruction: Secondary | ICD-10-CM

## 2017-11-16 NOTE — Patient Instructions (Signed)
Cholelithiasis Cholelithiasis is also called "gallstones." It is a kind of gallbladder disease. The gallbladder is an organ that stores a liquid (bile) that helps you digest fat. Gallstones [place] not cause symptoms ([place] be silent gallstones) until they cause a blockage, and then they can cause pain (gallbladder attack). Follow these instructions at home:  Take over-the-counter and prescription medicines only as told by your doctor.  Stay at a healthy weight.  Eat healthy foods. This includes: ? Eating fewer fatty foods, like fried foods. ? Eating fewer refined carbs (refined carbohydrates). Refined carbs are breads and grains that are highly processed, like white bread and white rice. Instead, choose whole grains like whole-wheat bread and brown rice. ? Eating more fiber. Almonds, fresh fruit, and beans are healthy sources of fiber.  Keep all follow-up visits as told by your doctor. This is important. Contact a doctor if:  You have sudden pain in the upper right side of your belly (abdomen). Pain might spread to your right shoulder or your chest. This [place] be a sign of a gallbladder attack.  You feel sick to your stomach (are nauseous).  You throw up (vomit).  You have been diagnosed with gallstones that have no symptoms and you get: ? Belly pain. ? Discomfort, burning, or fullness in the upper part of your belly (indigestion). Get help right away if:  You have sudden pain in the upper right side of your belly, and it lasts for more than 2 hours.  You have belly pain that lasts for more than 5 hours.  You have a fever or chills.  You keep feeling sick to your stomach or you keep throwing up.  Your skin or the whites of your eyes turn yellow (jaundice).  You have dark-colored pee (urine).  You have light-colored poop (stool). Summary  Cholelithiasis is also called "gallstones."  The gallbladder is an organ that stores a liquid (bile) that helps you digest fat.  Silent  gallstones are gallstones that do not cause symptoms.  A gallbladder attack [place] cause sudden pain in the upper right side of your belly. Pain might spread to your right shoulder or your chest. If this happens, contact your doctor.  If you have sudden pain in the upper right side of your belly that lasts for more than 2 hours, get help right away. This information is not intended to replace advice given to you by your health care provider. Make sure you discuss any questions you have with your health care provider. Document Released: 05/16/2008 Document Revised: 08/14/2016 Document Reviewed: 08/14/2016 Elsevier Interactive Patient Education  2017 Elsevier Inc.  

## 2017-11-16 NOTE — Progress Notes (Signed)
Careteam: Patient Care Team: [name], DO as PCP - General (Geriatric Medicine) [name], MD as Consulting Physician (Cardiology) Normajean Glasgow, MD as Attending Physician (Physical Medicine and Rehabilitation) Phylliss Bob, MD as Consulting Physician (Orthopedic Surgery)  Advanced Directive information    Allergies  Allergen Reactions  . Morphine And Related Shortness Of Breath  . Percocet [Oxycodone-Acetaminophen] Shortness Of Breath  . Valium Shortness Of Breath    Chief Complaint  Patient presents with  . Follow-up    Pt is being seen for an ED follow up. Pt was seen at [facility] on 11/12/17 due to epigastric/chest pain. Pt dx with gallstones. Pt reports that pain has gotten better and he has only had a few episodes of nausea.      HPI: Patient is a 83 y.o. male seen in the office today to follow up ED visit on 11/12/17 due to epigastric pain and nausea.  past medical history as noted below including paroxysmal atrial fibrillation on Eliquis, diastolic CHF, hypertension, hyperlipidemia, and DVT  CT was without obvious etiology but did show gallstones. Blood work without leukocytosis or elevated hepatic function.  Has not had a hx of gallstones.  Since ED he has changed diet and modified, no nausea or vomiting or abdominal pain. Feeling better today, worked PT yesterday and sore today from that.  No additional flares no epigastric pain.  Back to baseline.  Ongoing cough  Breathing is doing well, using O2 at bedtime only.   Did not get the debrox for the ear but reports ear is not bothering him.  Review of Systems:  Review of Systems  Constitutional: Negative for chills, fever and malaise/fatigue.  HENT: Positive for hearing loss. Negative for congestion and sore throat.   Eyes: Negative for blurred vision.       Glasses  Respiratory: Positive for cough. Negative for shortness of breath and wheezing.   Cardiovascular: Positive for leg swelling. Negative for  chest pain, palpitations, orthopnea, claudication and PND.  Gastrointestinal: Negative for abdominal pain, blood in stool, constipation, heartburn, melena and nausea.  Musculoskeletal: Negative for falls and myalgias.  Neurological: Positive for weakness.  Endo/Heme/Allergies: Bruises/bleeds easily.   Past Medical History:  Diagnosis Date  . Anxiety   . Arthritis   . Chronic diastolic CHF (congestive heart failure) ([place])    a. 01/2011 Echo: EF 50-55%, gr1 DD, mild AI, nl RV fxn, mild TR/PR.  . DDD (degenerative disc disease), cervical   . Depressive disorder, not elsewhere classified   . Depressive disorder, not elsewhere classified   . Difficult intubation   . DVT (deep venous thrombosis) ([place])   . Dysphagia, oral phase   . Dyspnea   . Edema   . History of kidney stones   . Hypertension   . Hypoxemia   . Impacted cerumen   . Mixed hyperlipidemia   . 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 Other myelopathy   . Pain in limb   . Palpitations   . Paroxysmal Atrial Fibrillation ([place])    a. a. 01/2011 in setting of post-op complications including aspiration pna;  b. CHA2DS2VASc = 4.  . Pneumonia 03/06/2003  . Spinal stenosis, unspecified region other than cervical   . 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 Thoracic or lumbosacral neuritis or radiculitis, unspecified    Past Surgical History:  Procedure Laterality Date  . CARDIOVERSION N/A  06/20/2017   Procedure: CARDIOVERSION;  Surgeon: [name], MD;  Location: [facility] ORS;  Service: Cardiovascular;  Laterality: N/A;  . CATARACT EXTRACTION W/ INTRAOCULAR LENS  IMPLANT, BILATERAL    . CERVICAL FUSION  02/10/2011  . history of abd ultrasound  11/01   fatty liver  . MULTIPLE TOOTH EXTRACTIONS    . ORIF FIBULA FRACTURE Left 01/06/2017   Procedure: OPEN REDUCTION INTERNAL FIXATION (ORIF) FIBULA FRACTURE DISTAL FIBULA;  Surgeon: Melrose Nakayama, MD;  Location: [facility];  Service: Orthopedics;  Laterality: Left;  Patient states has problems if he will have a tube in throat for Genera; Anesthesia   Social History:   reports that he has quit smoking. He quit after 1.00 year of use. he has never used smokeless tobacco. He reports that he does not drink alcohol or use drugs.  Family History  Problem Relation Age of Onset  . Stroke Father   . Atrial fibrillation Brother        on coumadin  . Heart disease Brother        AFib- coumadin  . Stroke Paternal Grandfather   . Cancer Other        colon cancer at early age  . Prostate cancer Neg Hx   . Kidney cancer Neg Hx   . Bladder Cancer Neg Hx     Medications:   Medication List        Accurate as of 11/16/17 10:54 AM. Always use your most recent med list.          amiodarone 200 MG tablet Commonly known as:  PACERONE Take 1 tablet (200 mg total) by mouth 2 (two) times daily.   apixaban 5 MG Tabs tablet Commonly known as:  ELIQUIS Take 1 tablet (5 mg total) by mouth 2 (two) times daily.   cetirizine 10 MG tablet Commonly known as:  ZYRTEC Take 1 tablet (10 mg total) by mouth daily.   fluticasone 50 MCG/ACT nasal spray Commonly known as:  FLONASE Place 2 sprays daily into both nostrils.   furosemide 40 MG tablet Commonly known as:  LASIX   gabapentin 600 MG tablet Commonly known as:  NEURONTIN   metoprolol tartrate 25 MG tablet Commonly known as:  LOPRESSOR Take 1 tablet (25 mg total) 2 (two) times daily by mouth.   pantoprazole 40 MG tablet Commonly known as:  PROTONIX Take 1 tablet (40 mg total) by mouth at bedtime.   potassium chloride SA 20 MEQ tablet Commonly known as:  K-DUR,KLOR-CON Take 0.5 tablets (10 mEq total) 2 (two) times daily by mouth.   pravastatin 80 MG tablet Commonly known as:  PRAVACHOL Take 1 tablet (80 mg total) daily by mouth.   traMADol 50 MG tablet Commonly known as:  ULTRAM   Vitamin D 1000 units capsule Take one tablet once daily         Physical Exam:  Vitals:   11/16/17 1049  BP: 132/76  Pulse: 66  Resp: 17  Temp: 98.2 F (36.8 C)  TempSrc: Oral  SpO2: 95%  Weight: 182 lb 9.6 oz (82.8 kg)  Height: 5\' 6"  (1.676 m)   Body mass index is 29.47 kg/m.  Physical Exam  Constitutional: He is oriented to person, place, and time. He appears well-developed and well-nourished.  HENT:  Right Ear: External ear normal.  Left Ear: External ear normal.  HOH  Eyes:  glasses  Neck: Normal range of motion. No JVD present.  Cardiovascular: Normal rate, regular rhythm and normal  heart sounds.  Pulmonary/Chest: Effort normal and breath sounds normal. He has no rales.  Abdominal: Soft. Bowel sounds are normal. He exhibits no distension and no mass. There is tenderness (mild RUQ). There is no rebound and no guarding. No hernia.  Musculoskeletal: He exhibits edema (1+ bilaterally).  Uses rolling walker  Neurological: He is alert and oriented to person, place, and time.  Skin: Skin is warm and dry.  Psychiatric: He has a normal mood and affect.    Labs reviewed: Basic Metabolic Panel: Recent Labs    03/29/17 1503  10/21/17 0219 10/27/17 1055 11/12/17 2025  NA 140   < > 140 141 141  K 4.6   < > 3.8 4.8 4.0  CL 104   < > 105 104 104  CO2 21   < > 28 32 26  GLUCOSE 80   < > 87 89 99  BUN 27   < > 18 27* 21*  CREATININE 1.02   < > 1.54* 1.27* 1.32*  CALCIUM 9.5   < > 8.5* 9.1 9.2  TSH 3.340  --   --   --   --    < > = values in this interval not displayed.   Liver Function Tests: Recent Labs    09/01/17 1015 11/12/17 2025  AST 36* 47*  ALT 39 46  ALKPHOS  --  55  BILITOT 0.7 0.6  PROT 7.0 7.7  ALBUMIN  --  4.0   Recent Labs    11/12/17 2025  LIPASE 38   No results for input(s): AMMONIA in the last 8760 hours. CBC: Recent Labs    06/16/17 0909  10/20/17 1410 10/27/17 1055 11/12/17 2025  WBC 6.1   < > 5.5 5.3 6.6  NEUTROABS 3.1  --  3.0 2,894  --   HGB 15.1   < > 13.5 13.0* 14.3  HCT 44.2    < > 41.2 37.8* 42.5  MCV 94.5   < > 98.8 95.2 97.4  PLT 205   < > 186 200 251   < > = values in this interval not displayed.   Lipid Panel: Recent Labs    09/01/17 1015  CHOL 187  HDL 34*  TRIG 303*  CHOLHDL 5.5*   TSH: Recent Labs    03/29/17 1503  TSH 3.340   A1C: Lab Results  Component Value Date   HGBA1C 5.7 (H) 05/16/2016     Assessment/Plan 1. Gallstones No further N/V.blood work in ED was unremarkable but CT showed gallstones.  Abdominal pain has improved. Has made dietary changes and this is going well. More education given on this. Will monitor for now due to complex medical hx and improvement in symptoms.   2. Chronic diastolic CHF (congestive heart failure) ([place]) euvolemic at this time. Will cont current regimen.  3. Weakness Overall doing better. Working with PT at this time which is beneficial.   Next appt: to keep follow up with Dr Mariea Clonts as scheduled Carlos American. Harle Battiest  [facility] [phone] 8 am - 5 pm) [phone] (after hours)

## 2017-11-17 DIAGNOSIS — Z7901 Long term (current) use of anticoagulants: Secondary | ICD-10-CM | POA: Diagnosis not present

## 2017-11-17 DIAGNOSIS — E782 Mixed hyperlipidemia: Secondary | ICD-10-CM | POA: Diagnosis not present

## 2017-11-17 DIAGNOSIS — F419 Anxiety disorder, unspecified: Secondary | ICD-10-CM | POA: Diagnosis not present

## 2017-11-17 DIAGNOSIS — Z9981 Dependence on supplemental oxygen: Secondary | ICD-10-CM | POA: Diagnosis not present

## 2017-11-17 DIAGNOSIS — F329 Major depressive disorder, single episode, unspecified: Secondary | ICD-10-CM | POA: Diagnosis not present

## 2017-11-17 DIAGNOSIS — M1991 Primary osteoarthritis, unspecified site: Secondary | ICD-10-CM | POA: Diagnosis not present

## 2017-11-17 DIAGNOSIS — I5032 Chronic diastolic (congestive) heart failure: Secondary | ICD-10-CM | POA: Diagnosis not present

## 2017-11-17 DIAGNOSIS — I130 Hypertensive heart and chronic kidney disease with heart failure and stage 1 through stage 4 chronic kidney disease, or unspecified chronic kidney disease: Secondary | ICD-10-CM | POA: Diagnosis not present

## 2017-11-17 DIAGNOSIS — I82409 Acute embolism and thrombosis of unspecified deep veins of unspecified lower extremity: Secondary | ICD-10-CM | POA: Diagnosis not present

## 2017-11-17 DIAGNOSIS — M5030 Other cervical disc degeneration, unspecified cervical region: Secondary | ICD-10-CM | POA: Diagnosis not present

## 2017-11-17 DIAGNOSIS — I480 Paroxysmal atrial fibrillation: Secondary | ICD-10-CM | POA: Diagnosis not present

## 2017-11-17 DIAGNOSIS — G959 Disease of spinal cord, unspecified: Secondary | ICD-10-CM | POA: Diagnosis not present

## 2017-11-17 DIAGNOSIS — N183 Chronic kidney disease, stage 3 (moderate): Secondary | ICD-10-CM | POA: Diagnosis not present

## 2017-11-21 DIAGNOSIS — I5032 Chronic diastolic (congestive) heart failure: Secondary | ICD-10-CM | POA: Diagnosis not present

## 2017-11-21 DIAGNOSIS — R0602 Shortness of breath: Secondary | ICD-10-CM | POA: Diagnosis not present

## 2017-11-23 DIAGNOSIS — I480 Paroxysmal atrial fibrillation: Secondary | ICD-10-CM | POA: Diagnosis not present

## 2017-11-23 DIAGNOSIS — I82409 Acute embolism and thrombosis of unspecified deep veins of unspecified lower extremity: Secondary | ICD-10-CM | POA: Diagnosis not present

## 2017-11-23 DIAGNOSIS — Z7901 Long term (current) use of anticoagulants: Secondary | ICD-10-CM | POA: Diagnosis not present

## 2017-11-23 DIAGNOSIS — M1991 Primary osteoarthritis, unspecified site: Secondary | ICD-10-CM | POA: Diagnosis not present

## 2017-11-23 DIAGNOSIS — E782 Mixed hyperlipidemia: Secondary | ICD-10-CM | POA: Diagnosis not present

## 2017-11-23 DIAGNOSIS — F329 Major depressive disorder, single episode, unspecified: Secondary | ICD-10-CM | POA: Diagnosis not present

## 2017-11-23 DIAGNOSIS — G959 Disease of spinal cord, unspecified: Secondary | ICD-10-CM | POA: Diagnosis not present

## 2017-11-23 DIAGNOSIS — N183 Chronic kidney disease, stage 3 (moderate): Secondary | ICD-10-CM | POA: Diagnosis not present

## 2017-11-23 DIAGNOSIS — F419 Anxiety disorder, unspecified: Secondary | ICD-10-CM | POA: Diagnosis not present

## 2017-11-23 DIAGNOSIS — Z9981 Dependence on supplemental oxygen: Secondary | ICD-10-CM | POA: Diagnosis not present

## 2017-11-23 DIAGNOSIS — M5030 Other cervical disc degeneration, unspecified cervical region: Secondary | ICD-10-CM | POA: Diagnosis not present

## 2017-11-23 DIAGNOSIS — I5032 Chronic diastolic (congestive) heart failure: Secondary | ICD-10-CM | POA: Diagnosis not present

## 2017-11-23 DIAGNOSIS — I130 Hypertensive heart and chronic kidney disease with heart failure and stage 1 through stage 4 chronic kidney disease, or unspecified chronic kidney disease: Secondary | ICD-10-CM | POA: Diagnosis not present

## 2017-11-24 DIAGNOSIS — E782 Mixed hyperlipidemia: Secondary | ICD-10-CM | POA: Diagnosis not present

## 2017-11-24 DIAGNOSIS — Z9981 Dependence on supplemental oxygen: Secondary | ICD-10-CM | POA: Diagnosis not present

## 2017-11-24 DIAGNOSIS — M1991 Primary osteoarthritis, unspecified site: Secondary | ICD-10-CM | POA: Diagnosis not present

## 2017-11-24 DIAGNOSIS — I480 Paroxysmal atrial fibrillation: Secondary | ICD-10-CM | POA: Diagnosis not present

## 2017-11-24 DIAGNOSIS — Z7901 Long term (current) use of anticoagulants: Secondary | ICD-10-CM | POA: Diagnosis not present

## 2017-11-24 DIAGNOSIS — I5032 Chronic diastolic (congestive) heart failure: Secondary | ICD-10-CM | POA: Diagnosis not present

## 2017-11-24 DIAGNOSIS — M5030 Other cervical disc degeneration, unspecified cervical region: Secondary | ICD-10-CM | POA: Diagnosis not present

## 2017-11-24 DIAGNOSIS — F419 Anxiety disorder, unspecified: Secondary | ICD-10-CM | POA: Diagnosis not present

## 2017-11-24 DIAGNOSIS — I82409 Acute embolism and thrombosis of unspecified deep veins of unspecified lower extremity: Secondary | ICD-10-CM | POA: Diagnosis not present

## 2017-11-24 DIAGNOSIS — I130 Hypertensive heart and chronic kidney disease with heart failure and stage 1 through stage 4 chronic kidney disease, or unspecified chronic kidney disease: Secondary | ICD-10-CM | POA: Diagnosis not present

## 2017-11-24 DIAGNOSIS — N183 Chronic kidney disease, stage 3 (moderate): Secondary | ICD-10-CM | POA: Diagnosis not present

## 2017-11-24 DIAGNOSIS — G959 Disease of spinal cord, unspecified: Secondary | ICD-10-CM | POA: Diagnosis not present

## 2017-11-24 DIAGNOSIS — F329 Major depressive disorder, single episode, unspecified: Secondary | ICD-10-CM | POA: Diagnosis not present

## 2017-11-27 ENCOUNTER — Ambulatory Visit: Payer: PPO | Admitting: Physician Assistant

## 2017-11-28 DIAGNOSIS — I480 Paroxysmal atrial fibrillation: Secondary | ICD-10-CM | POA: Diagnosis not present

## 2017-11-28 DIAGNOSIS — Z9981 Dependence on supplemental oxygen: Secondary | ICD-10-CM | POA: Diagnosis not present

## 2017-11-28 DIAGNOSIS — N183 Chronic kidney disease, stage 3 (moderate): Secondary | ICD-10-CM | POA: Diagnosis not present

## 2017-11-28 DIAGNOSIS — M5030 Other cervical disc degeneration, unspecified cervical region: Secondary | ICD-10-CM | POA: Diagnosis not present

## 2017-11-28 DIAGNOSIS — G959 Disease of spinal cord, unspecified: Secondary | ICD-10-CM | POA: Diagnosis not present

## 2017-11-28 DIAGNOSIS — F329 Major depressive disorder, single episode, unspecified: Secondary | ICD-10-CM | POA: Diagnosis not present

## 2017-11-28 DIAGNOSIS — I82409 Acute embolism and thrombosis of unspecified deep veins of unspecified lower extremity: Secondary | ICD-10-CM | POA: Diagnosis not present

## 2017-11-28 DIAGNOSIS — F419 Anxiety disorder, unspecified: Secondary | ICD-10-CM | POA: Diagnosis not present

## 2017-11-28 DIAGNOSIS — E782 Mixed hyperlipidemia: Secondary | ICD-10-CM | POA: Diagnosis not present

## 2017-11-28 DIAGNOSIS — I130 Hypertensive heart and chronic kidney disease with heart failure and stage 1 through stage 4 chronic kidney disease, or unspecified chronic kidney disease: Secondary | ICD-10-CM | POA: Diagnosis not present

## 2017-11-28 DIAGNOSIS — M1991 Primary osteoarthritis, unspecified site: Secondary | ICD-10-CM | POA: Diagnosis not present

## 2017-11-28 DIAGNOSIS — Z7901 Long term (current) use of anticoagulants: Secondary | ICD-10-CM | POA: Diagnosis not present

## 2017-11-28 DIAGNOSIS — I5032 Chronic diastolic (congestive) heart failure: Secondary | ICD-10-CM | POA: Diagnosis not present

## 2017-12-06 DIAGNOSIS — F419 Anxiety disorder, unspecified: Secondary | ICD-10-CM | POA: Diagnosis not present

## 2017-12-06 DIAGNOSIS — N183 Chronic kidney disease, stage 3 (moderate): Secondary | ICD-10-CM | POA: Diagnosis not present

## 2017-12-06 DIAGNOSIS — G959 Disease of spinal cord, unspecified: Secondary | ICD-10-CM | POA: Diagnosis not present

## 2017-12-06 DIAGNOSIS — M1991 Primary osteoarthritis, unspecified site: Secondary | ICD-10-CM | POA: Diagnosis not present

## 2017-12-06 DIAGNOSIS — I130 Hypertensive heart and chronic kidney disease with heart failure and stage 1 through stage 4 chronic kidney disease, or unspecified chronic kidney disease: Secondary | ICD-10-CM | POA: Diagnosis not present

## 2017-12-06 DIAGNOSIS — Z9981 Dependence on supplemental oxygen: Secondary | ICD-10-CM | POA: Diagnosis not present

## 2017-12-06 DIAGNOSIS — I82409 Acute embolism and thrombosis of unspecified deep veins of unspecified lower extremity: Secondary | ICD-10-CM | POA: Diagnosis not present

## 2017-12-06 DIAGNOSIS — M5030 Other cervical disc degeneration, unspecified cervical region: Secondary | ICD-10-CM | POA: Diagnosis not present

## 2017-12-06 DIAGNOSIS — I5032 Chronic diastolic (congestive) heart failure: Secondary | ICD-10-CM | POA: Diagnosis not present

## 2017-12-06 DIAGNOSIS — F329 Major depressive disorder, single episode, unspecified: Secondary | ICD-10-CM | POA: Diagnosis not present

## 2017-12-06 DIAGNOSIS — Z7901 Long term (current) use of anticoagulants: Secondary | ICD-10-CM | POA: Diagnosis not present

## 2017-12-06 DIAGNOSIS — E782 Mixed hyperlipidemia: Secondary | ICD-10-CM | POA: Diagnosis not present

## 2017-12-06 DIAGNOSIS — I480 Paroxysmal atrial fibrillation: Secondary | ICD-10-CM | POA: Diagnosis not present

## 2017-12-09 ENCOUNTER — Other Ambulatory Visit: Payer: Self-pay | Admitting: Cardiovascular Disease

## 2017-12-13 DIAGNOSIS — I5032 Chronic diastolic (congestive) heart failure: Secondary | ICD-10-CM | POA: Diagnosis not present

## 2017-12-13 DIAGNOSIS — I130 Hypertensive heart and chronic kidney disease with heart failure and stage 1 through stage 4 chronic kidney disease, or unspecified chronic kidney disease: Secondary | ICD-10-CM | POA: Diagnosis not present

## 2017-12-13 DIAGNOSIS — F329 Major depressive disorder, single episode, unspecified: Secondary | ICD-10-CM | POA: Diagnosis not present

## 2017-12-13 DIAGNOSIS — G959 Disease of spinal cord, unspecified: Secondary | ICD-10-CM | POA: Diagnosis not present

## 2017-12-13 DIAGNOSIS — Z9981 Dependence on supplemental oxygen: Secondary | ICD-10-CM | POA: Diagnosis not present

## 2017-12-13 DIAGNOSIS — N183 Chronic kidney disease, stage 3 (moderate): Secondary | ICD-10-CM | POA: Diagnosis not present

## 2017-12-13 DIAGNOSIS — I480 Paroxysmal atrial fibrillation: Secondary | ICD-10-CM | POA: Diagnosis not present

## 2017-12-13 DIAGNOSIS — I82409 Acute embolism and thrombosis of unspecified deep veins of unspecified lower extremity: Secondary | ICD-10-CM | POA: Diagnosis not present

## 2017-12-13 DIAGNOSIS — E782 Mixed hyperlipidemia: Secondary | ICD-10-CM | POA: Diagnosis not present

## 2017-12-13 DIAGNOSIS — M5030 Other cervical disc degeneration, unspecified cervical region: Secondary | ICD-10-CM | POA: Diagnosis not present

## 2017-12-13 DIAGNOSIS — Z7901 Long term (current) use of anticoagulants: Secondary | ICD-10-CM | POA: Diagnosis not present

## 2017-12-13 DIAGNOSIS — F419 Anxiety disorder, unspecified: Secondary | ICD-10-CM | POA: Diagnosis not present

## 2017-12-13 DIAGNOSIS — M1991 Primary osteoarthritis, unspecified site: Secondary | ICD-10-CM | POA: Diagnosis not present

## 2017-12-14 ENCOUNTER — Ambulatory Visit (INDEPENDENT_AMBULATORY_CARE_PROVIDER_SITE_OTHER): Payer: PPO | Admitting: Pulmonary Disease

## 2017-12-14 ENCOUNTER — Other Ambulatory Visit: Payer: Self-pay | Admitting: *Deleted

## 2017-12-14 ENCOUNTER — Encounter: Payer: Self-pay | Admitting: Pulmonary Disease

## 2017-12-14 VITALS — BP 150/80 | HR 68 | Ht 66.0 in | Wt 184.0 lb

## 2017-12-14 DIAGNOSIS — I480 Paroxysmal atrial fibrillation: Secondary | ICD-10-CM

## 2017-12-14 DIAGNOSIS — J310 Chronic rhinitis: Secondary | ICD-10-CM

## 2017-12-14 DIAGNOSIS — K219 Gastro-esophageal reflux disease without esophagitis: Secondary | ICD-10-CM | POA: Diagnosis not present

## 2017-12-14 DIAGNOSIS — R053 Chronic cough: Secondary | ICD-10-CM

## 2017-12-14 DIAGNOSIS — R05 Cough: Secondary | ICD-10-CM

## 2017-12-14 NOTE — Patient Instructions (Signed)
Continue Flonase inhaler -2 sprays per nostril once a day Continue Protonix -40 mg daily Follow-up as needed

## 2017-12-14 NOTE — Progress Notes (Signed)
PULMONARY OFFICE FOLLOW-UP NOTE  Requesting MD/Service: Rockey Situ Date of initial consultation: 09/18/17 Reason for consultation: Chronic cough  PT PROFILE: 84 y.o. male with minimal very remote smoking history referred for evaluation of chronic cough   DATA: 02/13/11 CT chest: Bibasilar atelectasis. No other significant findings 10/19/17 hospitalized for syncope.  Noted to have paroxysmal atrial fibrillation.  Discharged home on nocturnal oxygen. 11/12/17 CTAP: No acute intra-abdominal or pelvic pathology  SUBJ:  Since initial evaluation in October, he has been hospitalized twice, once for syncope and paroxysmal atrial fibrillation and once for chest pain.  He was initially evaluated in this office for chronic cough.  He was initiated on PPI therapy and nasal steroids.  His cough is nearly completely resolved and is no longer problematic.  He denies significant exertional dyspnea.  He has recently been diagnosed with gallstones.  He is still undergoing evaluation to determine the optimal strategy of management for this.  He reports intermittent pain between his shoulder blades which is likely attributable to gallbladder pain.  Otherwise, denies CP, fever, purulent sputum, hemoptysis, LE edema and calf tenderness.   Vitals:   12/14/17 0941 12/14/17 0945  BP:  (!) 150/80  Pulse:  68  SpO2:  94%  Weight: 83.5 kg (184 lb)   Height: 5\' 6"  (1.676 m)      EXAM:  Gen: NAD HEENT: NCAT, sclera white, B TCs impacted with cerumen Neck: No JVD Lungs: breath sounds mildly diminished without adventitious sounds Cardiovascular: RRR, no murmurs Abdomen: Soft, nontender, normal BS Ext: without clubbing, cyanosis, edema Neuro: grossly intact Skin: Limited exam, no lesions noted   DATA:   BMP Latest Ref Rng & Units 11/12/2017 10/27/2017 10/21/2017  Glucose 65 - 99 mg/dL 99 89 87  BUN 6 - 20 mg/dL 21(H) 27(H) 18  Creatinine 0.61 - 1.24 mg/dL 1.32(H) 1.27(H) 1.54(H)  BUN/Creat Ratio 6 - 22  (calc) - 21 -  Sodium 135 - 145 mmol/L 141 141 140  Potassium 3.5 - 5.1 mmol/L 4.0 4.8 3.8  Chloride 101 - 111 mmol/L 104 104 105  CO2 22 - 32 mmol/L 26 32 28  Calcium 8.9 - 10.3 mg/dL 9.2 9.1 8.5(L)    CBC Latest Ref Rng & Units 11/12/2017 10/27/2017 10/20/2017  WBC 3.8 - 10.6 K/uL 6.6 5.3 5.5  Hemoglobin 13.0 - 18.0 g/dL 14.3 13.0(L) 13.5  Hematocrit 40.0 - 52.0 % 42.5 37.8(L) 41.2  Platelets 150 - 440 K/uL 251 200 186    CXR 11/12/17:  No acute cardiac or pulmonary findings.  Mild bibasilar atelectasis  IMPRESSION:     ICD-10-CM   1. Chronic cough R05   2. Chronic rhinitis J31.0   3. Gastroesophageal reflux disease, esophagitis presence not specified K21.9      PLAN:  Continue Flonase inhaler -2 sprays per nostril once a day Continue Protonix -40 mg daily Follow-up as needed   Merton Border, MD PCCM service Mobile [phone] Pager [phone] 12/15/2017 12:36 PM

## 2017-12-15 DIAGNOSIS — M25572 Pain in left ankle and joints of left foot: Secondary | ICD-10-CM | POA: Diagnosis not present

## 2017-12-18 ENCOUNTER — Ambulatory Visit: Payer: PPO | Admitting: Internal Medicine

## 2017-12-19 DIAGNOSIS — F329 Major depressive disorder, single episode, unspecified: Secondary | ICD-10-CM | POA: Diagnosis not present

## 2017-12-19 DIAGNOSIS — M5030 Other cervical disc degeneration, unspecified cervical region: Secondary | ICD-10-CM | POA: Diagnosis not present

## 2017-12-19 DIAGNOSIS — Z9981 Dependence on supplemental oxygen: Secondary | ICD-10-CM | POA: Diagnosis not present

## 2017-12-19 DIAGNOSIS — G959 Disease of spinal cord, unspecified: Secondary | ICD-10-CM | POA: Diagnosis not present

## 2017-12-19 DIAGNOSIS — F419 Anxiety disorder, unspecified: Secondary | ICD-10-CM | POA: Diagnosis not present

## 2017-12-19 DIAGNOSIS — I480 Paroxysmal atrial fibrillation: Secondary | ICD-10-CM | POA: Diagnosis not present

## 2017-12-19 DIAGNOSIS — I130 Hypertensive heart and chronic kidney disease with heart failure and stage 1 through stage 4 chronic kidney disease, or unspecified chronic kidney disease: Secondary | ICD-10-CM | POA: Diagnosis not present

## 2017-12-19 DIAGNOSIS — I82409 Acute embolism and thrombosis of unspecified deep veins of unspecified lower extremity: Secondary | ICD-10-CM | POA: Diagnosis not present

## 2017-12-19 DIAGNOSIS — N183 Chronic kidney disease, stage 3 (moderate): Secondary | ICD-10-CM | POA: Diagnosis not present

## 2017-12-19 DIAGNOSIS — Z7901 Long term (current) use of anticoagulants: Secondary | ICD-10-CM | POA: Diagnosis not present

## 2017-12-19 DIAGNOSIS — M1991 Primary osteoarthritis, unspecified site: Secondary | ICD-10-CM | POA: Diagnosis not present

## 2017-12-19 DIAGNOSIS — I5032 Chronic diastolic (congestive) heart failure: Secondary | ICD-10-CM | POA: Diagnosis not present

## 2017-12-19 DIAGNOSIS — E782 Mixed hyperlipidemia: Secondary | ICD-10-CM | POA: Diagnosis not present

## 2017-12-20 ENCOUNTER — Ambulatory Visit: Payer: PPO | Admitting: Nurse Practitioner

## 2017-12-20 ENCOUNTER — Encounter: Payer: Self-pay | Admitting: Nurse Practitioner

## 2017-12-20 VITALS — BP 130/80 | HR 63 | Ht 67.0 in | Wt 182.5 lb

## 2017-12-20 DIAGNOSIS — Z79899 Other long term (current) drug therapy: Secondary | ICD-10-CM

## 2017-12-20 DIAGNOSIS — E782 Mixed hyperlipidemia: Secondary | ICD-10-CM

## 2017-12-20 DIAGNOSIS — I5030 Unspecified diastolic (congestive) heart failure: Secondary | ICD-10-CM | POA: Diagnosis not present

## 2017-12-20 DIAGNOSIS — R55 Syncope and collapse: Secondary | ICD-10-CM | POA: Diagnosis not present

## 2017-12-20 DIAGNOSIS — I10 Essential (primary) hypertension: Secondary | ICD-10-CM

## 2017-12-20 DIAGNOSIS — I480 Paroxysmal atrial fibrillation: Secondary | ICD-10-CM | POA: Diagnosis not present

## 2017-12-20 NOTE — Patient Instructions (Signed)
Medication Instructions: - Your physician recommends that you continue on your current medications as directed. Please refer to the Current Medication list given to you today.  Labwork: - none ordered  Procedures/Testing: - none ordered  Follow-Up: - Your physician wants you to follow-up in: 4-6 months with Dr. Rockey Situ. You will receive a reminder letter in the mail two months in advance. If you don't receive a letter, please call our office to schedule the follow-up appointment.   Any Additional Special Instructions Will Be Listed Below (If Applicable).     If you need a refill on your cardiac medications before your next appointment, please call your pharmacy.

## 2017-12-20 NOTE — Progress Notes (Signed)
Office Visit    Patient Name: Eric [name] Date of Encounter: 12/20/2017  Primary Care Provider:  [name], DO Primary Cardiologist:  Ida Rogue, MD  Chief Complaint    84 year old male with a history of paroxysmal atrial fibrillation, HFpEF, atypical chest pain, hypertension, hyperlipidemia, and recent episode of syncope, who presents for follow-up.  Past Medical History    Past Medical History:  Diagnosis Date  . Anxiety   . Arthritis   . Atypical chest pain    a. 05/2017 MV: no ischemia, EF 79%.  . Chronic diastolic CHF (congestive heart failure) ([place])    a. 01/2011 Echo: EF 50-55%, gr1 DD, mild AI, nl RV fxn, mild TR/PR; b. 10/2017 Echo: EF 60-65%, mild LVH, gr2 DD.  Marland Kitchen DDD (degenerative disc disease), cervical   . Depressive disorder, not elsewhere classified   . Difficult intubation   . Dysphagia, oral phase   . Dyspnea   . Edema   . History of DVT (deep vein thrombosis)   . History of kidney stones   . Hypertension   . Hypoxemia   . Impacted cerumen   . Mixed hyperlipidemia   . 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 Other myelopathy   . Pain in limb   . Palpitations   . Paroxysmal Atrial Fibrillation ([place])    a. a. 01/2011 in setting of post-op complications including aspiration pna;  b. CHA2DS2VASc = 4--> Amio/Eliquis.  . Pneumonia 03/06/2003  . Spinal stenosis, unspecified region other than cervical   . 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 Syncope    a. 10/2017-->Event monitor: RSR, rare PACs/PVCs.  . Thoracic or lumbosacral neuritis or radiculitis, unspecified    Past Surgical History:  Procedure Laterality Date  . CARDIOVERSION N/A 06/20/2017   Procedure: CARDIOVERSION;  Surgeon: [name], MD;  Location: [facility] ORS;  Service: Cardiovascular;  Laterality: N/A;  . CATARACT EXTRACTION W/ INTRAOCULAR LENS  IMPLANT, BILATERAL    . CERVICAL FUSION  02/10/2011  . history of  abd ultrasound  11/01   fatty liver  . MULTIPLE TOOTH EXTRACTIONS    . ORIF FIBULA FRACTURE Left 01/06/2017   Procedure: OPEN REDUCTION INTERNAL FIXATION (ORIF) FIBULA FRACTURE DISTAL FIBULA;  Surgeon: Melrose Nakayama, MD;  Location: [facility];  Service: Orthopedics;  Laterality: Left;  Patient states has problems if he will have a tube in throat for Genera; Anesthesia    Allergies  Allergies  Allergen Reactions  . Morphine And Related Shortness Of Breath  . Percocet [Oxycodone-Acetaminophen] Shortness Of Breath  . Valium Shortness Of Breath    History of Present Illness    84 year old male with with the above past medical history including atypical chest pain with negative Myoview in June 0160, chronic diastolic congestive heart failure, paroxysmal atrial fibrillation on amiodarone and Eliquis, hypertension, hyperlipidemia, and recent hospitalization in the setting of syncope.  This occurred in November and he was admitted to [facility] after losing consciousness at home just before bending down to feed his cat.  During his hospitalization, he had one spurious troponin elevation of 0.12.  He did describe somewhat atypical chest pain, which was similar to previous episodes.  In light of recent stress testing, no further ischemic evaluation was performed, especially since LV function was normal on echocardiogram.  Patient did not have any events on telemetry and was sent home with a 30-day  event monitor.  This showed rare PACs and PVCs but no significant arrhythmias.  He has had no recurrence of presyncope or syncope and overall has been doing well from a cardiac standpoint.  Unfortunately, he has been experiencing intermittent bouts of acute abdominal discomfort.  He was seen in the [facility] ED in early December and CT showed cholelithiasis.  There were no acute findings.  He has follow-up with his primary care provider tomorrow and expects to be referred to gastroenterology.  He has had only one other  bout of right upper quadrant pain since his ER visit in early December.  He denies any substernal chest pain, dyspnea, palpitations, PND, orthopnea, dizziness, or early satiety.  Home Medications    Prior to Admission medications   Medication Sig Start Date End Date Taking? Authorizing Provider  amiodarone (PACERONE) 200 MG tablet Take 1 tablet (200 mg total) by mouth daily. 12/11/17  Yes Gollan, Kathlene November, MD  apixaban (ELIQUIS) 5 MG TABS tablet Take 1 tablet (5 mg total) by mouth 2 (two) times daily. 04/24/17  Yes [name], MD  cetirizine (ZYRTEC) 10 MG tablet Take 1 tablet (10 mg total) by mouth daily. 11/01/17  Yes Lauree Chandler, NP  Cholecalciferol (VITAMIN D) 1000 UNITS capsule Take one tablet once daily 08/09/13  Yes Reed, [name] L, DO  fluticasone (FLONASE) 50 MCG/ACT nasal spray Place 2 sprays daily into both nostrils. 10/27/17 10/27/18 Yes Lauree Chandler, NP  furosemide (LASIX) 40 MG tablet Take 40 mg 2 (two) times daily by mouth.   Yes [provider]  gabapentin (NEURONTIN) 600 MG tablet Take 300 mg 2 (two) times daily by mouth.   Yes [provider]  metoprolol tartrate (LOPRESSOR) 25 MG tablet Take 1 tablet (25 mg total) 2 (two) times daily by mouth. 10/21/17 10/21/18 Yes Georgette Shell, MD  pantoprazole (PROTONIX) 40 MG tablet Take 1 tablet (40 mg total) by mouth at bedtime. 11/01/17  Yes Lauree Chandler, NP  potassium chloride SA (K-DUR,KLOR-CON) 20 MEQ tablet Take 0.5 tablets (10 mEq total) 2 (two) times daily by mouth. 10/21/17  Yes Georgette Shell, MD  pravastatin (PRAVACHOL) 80 MG tablet Take 1 tablet (80 mg total) daily by mouth. 10/23/17  Yes Reed, [name] L, DO  traMADol (ULTRAM) 50 MG tablet Take 50 mg by mouth at bedtime as needed for pain.   Yes [provider]    Review of Systems    Doing well from a cardiac standpoint but has had some right upper quadrant pain with recent finding of cholelithiasis.  He denies  chest pain, dyspnea, palpitations, PND, orthopnea, dizziness, recurrent syncope, edema, or early satiety.  All other systems reviewed and are otherwise negative except as noted above.  Physical Exam    VS:  BP 130/80 (BP Location: Left Arm, Patient Position: Sitting, Cuff Size: Normal)   Pulse 63   Ht 5\' 7"  (1.702 m)   Wt 182 lb 8 oz (82.8 kg)   BMI 28.58 kg/m  , BMI Body mass index is 28.58 kg/m. GEN: Well nourished, well developed, in no acute distress.  HEENT: normal.  Neck: Supple, no JVD, carotid bruits, or masses. Cardiac: RRR, no murmurs, rubs, or gallops. No clubbing, cyanosis, edema.  Radials/DP/PT 2+ and equal bilaterally.  Respiratory:  Respirations regular and unlabored, clear to auscultation bilaterally. GI: Soft, nontender, nondistended, BS + x 4. MS: no deformity or atrophy. Skin: warm and dry, no rash. Neuro:  Strength and sensation are intact.  Psych: Normal affect.  Accessory Clinical Findings    ECG -sinus rhythm, leftward axis, 63 first-degree AV block, no acute ST or T changes.  Assessment & Plan    1.  Syncope: Patient suffered a syncopal spell in November.  He was hospitalized at [facility].  No findings on telemetry or recurrence of symptoms.  Echocardiogram showed normal LV function.  30-day event monitor did not show any significant arrhythmias.  He has had no recurrence of presyncope or syncope.  He has previous been instructed that he is to avoid driving for a total of 6 months following that event given unknown etiology.  2.  Paroxysmal atrial fibrillation: No recent palpitations.  He is in sinus rhythm today and maintained on amiodarone and Eliquis.  Stable CBC and complete metabolic panel in December.  TSH was normal in April.  3.  Atypical chest pain: He reported some recurrence of chest pain when hospitalized in November.  This is often sharp and shooting.  He had a low risk stress test in June with normal LV function by echo in November.  He denies  chest pain today but has been having some epigastric/right upper quadrant pain and is being worked up for cholelithiasis.  4.  Right upper quadrant pain/cholelithiasis: Patient has had 2 bouts of acute onset lower abdominal and right upper quadrant discomfort.  He was seen December 2 in the emergency department with CT showing no acute findings but did show cholelithiasis.  He has follow-up with primary care tomorrow.  5.  Chronic diastolic congestive heart failure: Euvolemic on exam.  His heart rate and blood pressure stable.  He remains on Lasix 40 twice daily.  6.  Stage III chronic kidney disease: Creatinine stable on December 2 when seen in the ED.  7.  Hyperlipidemia: LDL 90 in June 2017.  He remains on statin therapy.  Normal LFTs in December.    8.  Essential hypertension: Stable.  9.  Disposition: Follow-up with Dr. Rockey Situ in 6 months or sooner if necessary.   Murray Hodgkins, NP 12/20/2017, 12:58 PM

## 2017-12-21 ENCOUNTER — Encounter: Payer: Self-pay | Admitting: Internal Medicine

## 2017-12-21 ENCOUNTER — Ambulatory Visit (INDEPENDENT_AMBULATORY_CARE_PROVIDER_SITE_OTHER): Payer: PPO | Admitting: Internal Medicine

## 2017-12-21 VITALS — BP 138/70 | HR 66 | Temp 97.8°F | Wt 181.0 lb

## 2017-12-21 DIAGNOSIS — R059 Cough, unspecified: Secondary | ICD-10-CM

## 2017-12-21 DIAGNOSIS — I5032 Chronic diastolic (congestive) heart failure: Secondary | ICD-10-CM

## 2017-12-21 DIAGNOSIS — K8020 Calculus of gallbladder without cholecystitis without obstruction: Secondary | ICD-10-CM | POA: Diagnosis not present

## 2017-12-21 DIAGNOSIS — R05 Cough: Secondary | ICD-10-CM | POA: Diagnosis not present

## 2017-12-21 DIAGNOSIS — R531 Weakness: Secondary | ICD-10-CM | POA: Diagnosis not present

## 2017-12-21 DIAGNOSIS — R600 Localized edema: Secondary | ICD-10-CM

## 2017-12-21 NOTE — Patient Instructions (Signed)
Please get compression socks to wear for your leg swelling.    You will hear from [place] about the referral to GI for your ERCP (gallstones).  Cholelithiasis Cholelithiasis is also called "gallstones." It is a kind of gallbladder disease. The gallbladder is an organ that stores a liquid (bile) that helps you digest fat. Gallstones [place] not cause symptoms (Elizardo be silent gallstones) until they cause a blockage, and then they can cause pain (gallbladder attack). Follow these instructions at home:  Take over-the-counter and prescription medicines only as told by your doctor.  Stay at a healthy weight.  Eat healthy foods. This includes: ? Eating fewer fatty foods, like fried foods. ? Eating fewer refined carbs (refined carbohydrates). Refined carbs are breads and grains that are highly processed, like white bread and white rice. Instead, choose whole grains like whole-wheat bread and brown rice. ? Eating more fiber. Almonds, fresh fruit, and beans are healthy sources of fiber.  Keep all follow-up visits as told by your doctor. This is important. Contact a doctor if:  You have sudden pain in the upper right side of your belly (abdomen). Pain might spread to your right shoulder or your chest. This [place] be a sign of a gallbladder attack.  You feel sick to your stomach (are nauseous).  You throw up (vomit).  You have been diagnosed with gallstones that have no symptoms and you get: ? Belly pain. ? Discomfort, burning, or fullness in the upper part of your belly (indigestion). Get help right away if:  You have sudden pain in the upper right side of your belly, and it lasts for more than 2 hours.  You have belly pain that lasts for more than 5 hours.  You have a fever or chills.  You keep feeling sick to your stomach or you keep throwing up.  Your skin or the whites of your eyes turn yellow (jaundice).  You have dark-colored pee (urine).  You have light-colored poop  (stool). Summary  Cholelithiasis is also called "gallstones."  The gallbladder is an organ that stores a liquid (bile) that helps you digest fat.  Silent gallstones are gallstones that do not cause symptoms.  A gallbladder attack [place] cause sudden pain in the upper right side of your belly. Pain might spread to your right shoulder or your chest. If this happens, contact your doctor.  If you have sudden pain in the upper right side of your belly that lasts for more than 2 hours, get help right away. This information is not intended to replace advice given to you by your health care provider. Make sure you discuss any questions you have with your health care provider. Document Released: 05/16/2008 Document Revised: 08/14/2016 Document Reviewed: 08/14/2016 Elsevier Interactive Patient Education  2017 [employer].

## 2017-12-21 NOTE — Progress Notes (Signed)
Location:  [facility] clinic Provider:  Cherl Gorney L. Mariea Clonts, D.O., C.M.D.  Code Status: FULL CODE, needs discussion at next routine visit Goals of Care:  Advanced Directives 11/12/2017  Does Patient Have a Medical Advance Directive? Yes  Type of Advance Directive Living will  Does patient want to make changes to medical advance directive? -  Copy of [place] in Chart? No - copy requested  Would patient like information on creating a medical advance directive? -     Chief Complaint  Patient presents with  . Medical Management of Chronic Issues    66mth follow-up    HPI: Patient is a 84 y.o. male seen today for medical management of chronic diseases. He has a pertinent history of atrial fibrillation on Eliquis, diastolic CHF, HTN, HLD, and DVT. Today in office accompanied with daughter, Jenny Reichmann, who drove him. As he is still on a 6 month wait on driving secondary to a "blacking out" back in November of 2018. He was fully worked up for this without significant findings.   He is currently doing PT and is enjoying it very much. He feels stronger and hopes to stop using the front wheeled walker soon. He continues to have leg swelling with the Left side being more pronounced than Right. He states he is unable to put on the compression TEDS as it makes him short of breath trying to pull them on. So he has not been wearing them. He was previously advised about compression socks as they are easier to pull on, but he still has not purchased any at this time. We discussed in detail the benefits of wearing his compression hose for his edema.  His daughter reports that she "helps him with everything" so she could possibly assist with this.    Cholelithiasis:  He is still experiencing small bouts of abdominal pain even with the dietary changes. He was previously provided education on diet management with gallstones. Today in the office he desires to have a discussion on gallstone removal. Per him  and daughter they have been instructed to look into ERCP if possible to help avoid anesthesia.  We discussed dietary changes again today and opted to refer for ERCP rather than chole due to his significant cardiovascular risks and afib on anticoagulation.  His daughter drives a school bus and can only bring him to appts for 2 hours in the morning and gets angry and very rude to staff, my student and myself when we are running behind.      Past Medical History:  Diagnosis Date  . Anxiety   . Arthritis   . Atypical chest pain    a. 05/2017 MV: no ischemia, EF 79%.  . Chronic diastolic CHF (congestive heart failure) ([place])    a. 01/2011 Echo: EF 50-55%, gr1 DD, mild AI, nl RV fxn, mild TR/PR; b. 10/2017 Echo: EF 60-65%, mild LVH, gr2 DD.  Marland Kitchen DDD (degenerative disc disease), cervical   . Depressive disorder, not elsewhere classified   . Difficult intubation   . Dysphagia, oral phase   . Dyspnea   . Edema   . History of DVT (deep vein thrombosis)   . History of kidney stones   . Hypertension   . Hypoxemia   . Impacted cerumen   . Mixed hyperlipidemia   . 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 Other myelopathy   . Pain in limb   . Palpitations   . Paroxysmal Atrial  Fibrillation ([place])    a. a. 01/2011 in setting of post-op complications including aspiration pna;  b. CHA2DS2VASc = 4--> Amio/Eliquis.  . Pneumonia 03/06/2003  . Spinal stenosis, unspecified region other than cervical   . 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 Syncope    a. 10/2017-->Event monitor: RSR, rare PACs/PVCs.  . Thoracic or lumbosacral neuritis or radiculitis, unspecified     Past Surgical History:  Procedure Laterality Date  . CARDIOVERSION N/A 06/20/2017   Procedure: CARDIOVERSION;  Surgeon: [name], MD;  Location: [facility] ORS;  Service: Cardiovascular;  Laterality: N/A;  . CATARACT EXTRACTION W/ INTRAOCULAR LENS  IMPLANT, BILATERAL      . CERVICAL FUSION  02/10/2011  . history of abd ultrasound  11/01   fatty liver  . MULTIPLE TOOTH EXTRACTIONS    . ORIF FIBULA FRACTURE Left 01/06/2017   Procedure: OPEN REDUCTION INTERNAL FIXATION (ORIF) FIBULA FRACTURE DISTAL FIBULA;  Surgeon: Melrose Nakayama, MD;  Location: [facility];  Service: Orthopedics;  Laterality: Left;  Patient states has problems if he will have a tube in throat for Genera; Anesthesia    Allergies  Allergen Reactions  . Morphine And Related Shortness Of Breath  . Percocet [Oxycodone-Acetaminophen] Shortness Of Breath  . Valium Shortness Of Breath    Outpatient Encounter Medications as of 12/21/2017  Medication Sig  . amiodarone (PACERONE) 200 MG tablet Take 1 tablet (200 mg total) by mouth daily.  Marland Kitchen apixaban (ELIQUIS) 5 MG TABS tablet Take 1 tablet (5 mg total) by mouth 2 (two) times daily.  . cetirizine (ZYRTEC) 10 MG tablet Take 1 tablet (10 mg total) by mouth daily.  . Cholecalciferol (VITAMIN D) 1000 UNITS capsule Take one tablet once daily  . fluticasone (FLONASE) 50 MCG/ACT nasal spray Place 2 sprays daily into both nostrils.  . furosemide (LASIX) 40 MG tablet Take 40 mg 2 (two) times daily by mouth.  . gabapentin (NEURONTIN) 600 MG tablet Take 300 mg 2 (two) times daily by mouth.  . metoprolol tartrate (LOPRESSOR) 25 MG tablet Take 1 tablet (25 mg total) 2 (two) times daily by mouth.  . pantoprazole (PROTONIX) 40 MG tablet Take 1 tablet (40 mg total) by mouth at bedtime.  . potassium chloride SA (K-DUR,KLOR-CON) 20 MEQ tablet Take 0.5 tablets (10 mEq total) 2 (two) times daily by mouth.  . pravastatin (PRAVACHOL) 80 MG tablet Take 1 tablet (80 mg total) daily by mouth.  . traMADol (ULTRAM) 50 MG tablet Take 50 mg by mouth at bedtime as needed for pain.   No facility-administered encounter medications on file as of 12/21/2017.     Review of Systems:  Review of Systems  Reason unable to perform ROS: notable for leg swelling, gerd, ruq pain, sob.   Constitutional: Negative for chills and fever.  HENT: Positive for hearing loss. Negative for sinus pain and sore throat.   Eyes: Negative.   Respiratory: Positive for shortness of breath. Negative for cough.   Cardiovascular: Positive for leg swelling.  Gastrointestinal: Positive for abdominal pain and heartburn. Negative for nausea and vomiting.  Genitourinary: Negative.   Musculoskeletal: Negative.   Skin: Negative.   Neurological: Negative.   Psychiatric/Behavioral: Negative.     Health Maintenance  Topic Date Due  . COLONOSCOPY  08/30/2015  . TETANUS/TDAP  10/15/2023  . INFLUENZA VACCINE  Completed  . PNA vac Low Risk Adult  Completed    Physical Exam: Vitals:   12/21/17 1002  BP: 138/70  Pulse: 66  Temp: 97.8 F (36.6 C)  TempSrc: Oral  SpO2: 95%  Weight: 181 lb (82.1 kg)   Body mass index is 28.35 kg/m. Physical Exam  Constitutional: He is oriented to person, place, and time. He appears well-developed and well-nourished.  HENT:  Head: Normocephalic and atraumatic.  Nose: Nose normal.  Eyes: Pupils are equal, round, and reactive to light.  Neck: Normal range of motion. Neck supple.  Cardiovascular: Normal rate, regular rhythm and intact distal pulses.  Pulses:      Dorsalis pedis pulses are 2+ on the right side, and 1+ on the left side.       Posterior tibial pulses are 2+ on the right side, and 2+ on the left side.  Pulmonary/Chest: Effort normal. He has decreased breath sounds in the right lower field and the left lower field.  Abdominal: Soft. Bowel sounds are normal. There is tenderness in the right upper quadrant.  Musculoskeletal: Normal range of motion. He exhibits edema. He exhibits no deformity.       Left ankle: He exhibits swelling.  Left ankle and foot 1+ edema Right leg and ankle is generalized  Neurological: He is alert and oriented to person, place, and time.  Skin: Skin is warm and dry. Capillary refill takes less than 2 seconds.   Psychiatric: He has a normal mood and affect. His behavior is normal. Judgment and thought content normal.  Vitals reviewed. Exam performed by me, as well, and I concur with exam as above   Labs reviewed: Basic Metabolic Panel: Recent Labs    03/29/17 1503  10/21/17 0219 10/27/17 1055 11/12/17 2025  NA 140   < > 140 141 141  K 4.6   < > 3.8 4.8 4.0  CL 104   < > 105 104 104  CO2 21   < > 28 32 26  GLUCOSE 80   < > 87 89 99  BUN 27   < > 18 27* 21*  CREATININE 1.02   < > 1.54* 1.27* 1.32*  CALCIUM 9.5   < > 8.5* 9.1 9.2  TSH 3.340  --   --   --   --    < > = values in this interval not displayed.   Liver Function Tests: Recent Labs    09/01/17 1015 11/12/17 2025  AST 36* 47*  ALT 39 46  ALKPHOS  --  55  BILITOT 0.7 0.6  PROT 7.0 7.7  ALBUMIN  --  4.0   Recent Labs    11/12/17 2025  LIPASE 38   No results for input(s): AMMONIA in the last 8760 hours. CBC: Recent Labs    06/16/17 0909  10/20/17 1410 10/27/17 1055 11/12/17 2025  WBC 6.1   < > 5.5 5.3 6.6  NEUTROABS 3.1  --  3.0 2,894  --   HGB 15.1   < > 13.5 13.0* 14.3  HCT 44.2   < > 41.2 37.8* 42.5  MCV 94.5   < > 98.8 95.2 97.4  PLT 205   < > 186 200 251   < > = values in this interval not displayed.   Lipid Panel: Recent Labs    09/01/17 1015  CHOL 187  HDL 34*  TRIG 303*  CHOLHDL 5.5*   Lab Results  Component Value Date   HGBA1C 5.7 (H) 05/16/2016    Procedures since last visit: No results found.  Assessment/Plan 1. Gallstones Patient continues to have abdominal pain more  intermittent and usually post meals. Will refer for ERCP. Anticipate stone removal and will await notes and results.  - Ambulatory referral to Gastroenterology--for ERCp--dtr requests appointment to be within two hour interval in the morning which [place] be just about impossible to arrange  2. Chronic diastolic CHF (congestive heart failure) (HCC) Stable, pt not so adherent to diet due to relying on others to prepare  his food who [place] not be complying with the low sodium, heart healthy recommendations themselves.  Today on exam there is still some mild edema in the lower legs, but improved dramatically from most previous visits. There are no other signs of acute CHF and he was just seen by cardiology yesterday. Encouraged  To maintain a low sodium diet.  3. Weakness Complete 6 wks home PT--making progress.  4. Lower leg edema Encouraged assistance with compression hose if he's unable to apply himself.   Encouraged to keep legs elevated and maintain a low sodium diet as well.   5. Cough This is mild and appears to be with shortness of breath during excretion events. Exam only revealed diminished lung base sounds, without crackles.  Not in acute chf exacerbation, but has some chronic symptoms due to deconditioning  Advised to monitor and if it worsens to call the clinic.   Labs/tests ordered:   Orders Placed This Encounter  Procedures  . Ambulatory referral to Gastroenterology    Referral Priority:   Routine    Referral Type:   Consultation    Referral Reason:   Specialty Services Required    Number of Visits Requested:   1    Next appt:  04/26/2018 med mgt Pt was seen and examined with Karen Kays, RN, NP student and additions/edits made as above.    Tericka Devincenzi L. Detta Mellin, D.O. [facility] [address], [place] [address] Cell Phone (Mon-Fri 8am-5pm):  [phone] On Call:  [phone] & follow prompts after 5pm & weekends Office Phone:  [phone] Office Fax:  [phone]

## 2017-12-22 ENCOUNTER — Telehealth: Payer: Self-pay | Admitting: *Deleted

## 2017-12-22 DIAGNOSIS — R0602 Shortness of breath: Secondary | ICD-10-CM | POA: Diagnosis not present

## 2017-12-22 DIAGNOSIS — I5032 Chronic diastolic (congestive) heart failure: Secondary | ICD-10-CM | POA: Diagnosis not present

## 2017-12-22 NOTE — Telephone Encounter (Signed)
Lynne Leader with [facility] called and left message on Clinical Intake requesting to continue Home Health PT 1X8wks.   Tried calling back and cannot leave message due to mailbox full. Will try again.

## 2017-12-22 NOTE — Telephone Encounter (Signed)
Verbal Orders Given.

## 2017-12-27 DIAGNOSIS — I5032 Chronic diastolic (congestive) heart failure: Secondary | ICD-10-CM | POA: Diagnosis not present

## 2017-12-27 DIAGNOSIS — I130 Hypertensive heart and chronic kidney disease with heart failure and stage 1 through stage 4 chronic kidney disease, or unspecified chronic kidney disease: Secondary | ICD-10-CM | POA: Diagnosis not present

## 2017-12-27 DIAGNOSIS — E782 Mixed hyperlipidemia: Secondary | ICD-10-CM | POA: Diagnosis not present

## 2017-12-27 DIAGNOSIS — M1991 Primary osteoarthritis, unspecified site: Secondary | ICD-10-CM | POA: Diagnosis not present

## 2017-12-27 DIAGNOSIS — F419 Anxiety disorder, unspecified: Secondary | ICD-10-CM | POA: Diagnosis not present

## 2017-12-27 DIAGNOSIS — I480 Paroxysmal atrial fibrillation: Secondary | ICD-10-CM | POA: Diagnosis not present

## 2017-12-27 DIAGNOSIS — Z9981 Dependence on supplemental oxygen: Secondary | ICD-10-CM | POA: Diagnosis not present

## 2017-12-27 DIAGNOSIS — N183 Chronic kidney disease, stage 3 (moderate): Secondary | ICD-10-CM | POA: Diagnosis not present

## 2017-12-27 DIAGNOSIS — F329 Major depressive disorder, single episode, unspecified: Secondary | ICD-10-CM | POA: Diagnosis not present

## 2017-12-27 DIAGNOSIS — M5030 Other cervical disc degeneration, unspecified cervical region: Secondary | ICD-10-CM | POA: Diagnosis not present

## 2017-12-27 DIAGNOSIS — G959 Disease of spinal cord, unspecified: Secondary | ICD-10-CM | POA: Diagnosis not present

## 2017-12-27 DIAGNOSIS — I82409 Acute embolism and thrombosis of unspecified deep veins of unspecified lower extremity: Secondary | ICD-10-CM | POA: Diagnosis not present

## 2017-12-27 DIAGNOSIS — Z7901 Long term (current) use of anticoagulants: Secondary | ICD-10-CM | POA: Diagnosis not present

## 2018-01-01 ENCOUNTER — Telehealth: Payer: Self-pay

## 2018-01-01 NOTE — Telephone Encounter (Signed)
Called patient to try to schedule AWV in the office. No answer-left voicemail to call back.    

## 2018-01-02 DIAGNOSIS — I5032 Chronic diastolic (congestive) heart failure: Secondary | ICD-10-CM | POA: Diagnosis not present

## 2018-01-02 DIAGNOSIS — I82409 Acute embolism and thrombosis of unspecified deep veins of unspecified lower extremity: Secondary | ICD-10-CM | POA: Diagnosis not present

## 2018-01-02 DIAGNOSIS — I130 Hypertensive heart and chronic kidney disease with heart failure and stage 1 through stage 4 chronic kidney disease, or unspecified chronic kidney disease: Secondary | ICD-10-CM | POA: Diagnosis not present

## 2018-01-02 DIAGNOSIS — M5030 Other cervical disc degeneration, unspecified cervical region: Secondary | ICD-10-CM | POA: Diagnosis not present

## 2018-01-02 DIAGNOSIS — N183 Chronic kidney disease, stage 3 (moderate): Secondary | ICD-10-CM | POA: Diagnosis not present

## 2018-01-02 DIAGNOSIS — F419 Anxiety disorder, unspecified: Secondary | ICD-10-CM | POA: Diagnosis not present

## 2018-01-02 DIAGNOSIS — M1991 Primary osteoarthritis, unspecified site: Secondary | ICD-10-CM | POA: Diagnosis not present

## 2018-01-02 DIAGNOSIS — Z7901 Long term (current) use of anticoagulants: Secondary | ICD-10-CM | POA: Diagnosis not present

## 2018-01-02 DIAGNOSIS — I480 Paroxysmal atrial fibrillation: Secondary | ICD-10-CM | POA: Diagnosis not present

## 2018-01-02 DIAGNOSIS — Z9981 Dependence on supplemental oxygen: Secondary | ICD-10-CM | POA: Diagnosis not present

## 2018-01-02 DIAGNOSIS — F329 Major depressive disorder, single episode, unspecified: Secondary | ICD-10-CM | POA: Diagnosis not present

## 2018-01-02 DIAGNOSIS — E782 Mixed hyperlipidemia: Secondary | ICD-10-CM | POA: Diagnosis not present

## 2018-01-02 DIAGNOSIS — G959 Disease of spinal cord, unspecified: Secondary | ICD-10-CM | POA: Diagnosis not present

## 2018-01-04 ENCOUNTER — Ambulatory Visit: Payer: PPO | Admitting: Gastroenterology

## 2018-01-08 ENCOUNTER — Ambulatory Visit (INDEPENDENT_AMBULATORY_CARE_PROVIDER_SITE_OTHER): Payer: PPO | Admitting: Gastroenterology

## 2018-01-08 ENCOUNTER — Other Ambulatory Visit: Payer: Self-pay

## 2018-01-08 ENCOUNTER — Other Ambulatory Visit
Admission: RE | Admit: 2018-01-08 | Discharge: 2018-01-08 | Disposition: A | Discharge status: Home / Self Care | Payer: PPO | Source: Ambulatory Visit | Attending: Gastroenterology | Admitting: Gastroenterology

## 2018-01-08 ENCOUNTER — Encounter: Payer: Self-pay | Admitting: Gastroenterology

## 2018-01-08 VITALS — BP 146/75 | HR 65 | Ht 67.0 in | Wt 183.8 lb

## 2018-01-08 DIAGNOSIS — K5900 Constipation, unspecified: Secondary | ICD-10-CM | POA: Diagnosis not present

## 2018-01-08 DIAGNOSIS — K8050 Calculus of bile duct without cholangitis or cholecystitis without obstruction: Secondary | ICD-10-CM

## 2018-01-08 LAB — COMPREHENSIVE METABOLIC PANEL
ALK PHOS: 43 U/L (ref 38–126)
ALT: 62 U/L (ref 17–63)
AST: 56 U/L — ABNORMAL HIGH (ref 15–41)
Albumin: 4.1 g/dL (ref 3.5–5.0)
Anion gap: 8 (ref 5–15)
BILIRUBIN TOTAL: 0.8 mg/dL (ref 0.3–1.2)
BUN: 22 mg/dL — ABNORMAL HIGH (ref 6–20)
CALCIUM: 8.8 mg/dL — AB (ref 8.9–10.3)
CO2: 27 mmol/L (ref 22–32)
CREATININE: 1.25 mg/dL — AB (ref 0.61–1.24)
Chloride: 104 mmol/L (ref 101–111)
GFR, EST AFRICAN AMERICAN: 60 mL/min — AB (ref >60)
GFR, EST NON AFRICAN AMERICAN: 51 mL/min — AB (ref >60)
Glucose, Bld: 85 mg/dL (ref 65–99)
Potassium: 4.3 mmol/L (ref 3.5–5.1)
Sodium: 139 mmol/L (ref 135–145)
Total Protein: 7.3 g/dL (ref 6.5–8.1)

## 2018-01-08 LAB — AMYLASE: Amylase: 83 U/L (ref 28–100)

## 2018-01-08 MED ORDER — URSODIOL 300 MG PO CAPS: 300.0000 mg | ORAL_CAPSULE | Freq: Two times a day (BID) | ORAL | 3 refills | Status: AC

## 2018-01-08 NOTE — Progress Notes (Signed)
[facility] Antig[facility] [address]  [place]  [place], [place] [address]  Main: [phone]  Fax: [phone]   Gastroenterology Consultation  Referring Provider:     [name], DO Primary Care Physician:  [name], DO Primary Gastroenterologist:  Dr. Vonda Antigua Reason for Consultation:    Intermittent right upper quadrant pain        HPI:   Eric [name] is a 84 y.o. y/o male referred for consultation & management  by Dr. Mariea Clonts, [name] L, DO.  Patient reports 3-51-monthhistory of right upper quadrant pain, sharp, with radiation to the back, 10/10, associated with meals, particularly fatty foods or heavy meals, occurs 30-60 minutes after a meal, and last for 30-60 minutes.  With no nausea vomiting, no altered bowel habits, no weight loss.  No dysphagia.  Patient was referred to [place] PCP, as his CT showed multiple stones within the gallbladder, no pericholecystic fluid referral was placed for evaluation for ERCP instead.  CT also reported a subcentimeter right hepatic hypodense lesion, "Paro represent a cyst or hemangioma".  Pancreas unremarkable.  His lab work showed a normal bilirubin at 0.6, alk phos normal at 55.  AST mildly elevated at 47.  Past Medical History:  Diagnosis Date  . Anxiety   . Arthritis   . Atypical chest pain    a. 05/2017 MV: no ischemia, EF 79%.  . Chronic diastolic CH[place] ([place]estive heart failure) ([place]    a. 01/2011 Echo: EF 50-55%, gr1 DD, mild AI, nl RV fxn, mild TR/PR; b. 10/2017 Echo: EF 60-65%, mild LVH, gr2 DD.  .Marland KitchenDDD (degenerative disc disease), cervical   . Depressive disorder, not elsewhere classified   . Difficult intubation   . Dysphagia, oral phase   . Dyspnea   . Edema   . History of DVT (deep vein thrombosis)   . History of kidney stones   . Hypertension   . Hypoxemia   . Impacted cerumen   . Mixed hyperlipidemia   . 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 Other myelopathy   . Pain in limb     . Palpitations   . Paroxysmal Atrial Fibrillation ([place]    a. a. 01/2011 in setting of post-op complications including aspiration pna;  b. CHA2DS2VASc = 4--> Amio/Eliquis.  . Pneumonia 03/06/2003  . Spinal stenosis, unspecified region other than cervical   . 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 Syncope    a. 10/2017-->Event monitor: RSR, rare PACs/PVCs.  . Thoracic or lumbosacral neuritis or radiculitis, unspecified     Past Surgical History:  Procedure Laterality Date  . CARDIOVERSION N/A 06/20/2017   Procedure: CARDIOVERSION;  Surgeon: GMinna Merritts MD;  Location: [facility] ORS;  Service: Cardiovascular;  Laterality: N/A;  . CATARACT EXTRACTION W/ INTRAOCULAR LENS  IMPLANT, BILATERAL    . CERVICAL FUSION  02/10/2011  . history of abd ultrasound  11/01   fatty liver  . MULTIPLE TOOTH EXTRACTIONS    . ORIF FIBULA FRACTURE Left 01/06/2017   Procedure: OPEN REDUCTION INTERNAL FIXATION (ORIF) FIBULA FRACTURE DISTAL FIBULA;  Surgeon: PMelrose Nakayama MD;  [facility]  Service: Orthopedics;  Laterality: Left;  Patient states has problems if he will have a tube in throat for Genera; Anesthesia    Prior to Admission medications   Medication Sig Start Date End Date Taking? Authorizing Provider  amiodarone (PACERONE) 200 MG  tablet Take 1 tablet (200 mg total) by mouth daily. 12/11/17  Yes Gollan, Kathlene November, MD  apixaban (ELIQUIS) 5 MG TABS tablet Take 1 tablet (5 mg total) by mouth 2 (two) times daily. 04/24/17  Yes [name], MD  cetirizine (ZYRTEC) 10 MG tablet Take 1 tablet (10 mg total) by mouth daily. 11/01/17  Yes Lauree Chandler, NP  Cholecalciferol (VITAMIN D) 1000 UNITS capsule Take one tablet once daily 08/09/13  Yes Reed, [name] L, DO  fluticasone (FLONASE) 50 MCG/ACT nasal spray Place 2 sprays daily into both nostrils. 10/27/17 10/27/18 Yes Lauree Chandler, NP  furosemide (LASIX) 40 MG tablet Take 40 mg 2 (two) times  daily by mouth.   Yes [provider]  gabapentin (NEURONTIN) 600 MG tablet Take 300 mg 2 (two) times daily by mouth.   Yes [provider]  metoprolol tartrate (LOPRESSOR) 25 MG tablet Take 1 tablet (25 mg total) 2 (two) times daily by mouth. 10/21/17 10/21/18 Yes Georgette Shell, MD  pantoprazole (PROTONIX) 40 MG tablet Take 1 tablet (40 mg total) by mouth at bedtime. 11/01/17  Yes Lauree Chandler, NP  potassium chloride SA (K-DUR,KLOR-CON) 20 MEQ tablet Take 0.5 tablets (10 mEq total) 2 (two) times daily by mouth. 10/21/17  Yes Georgette Shell, MD  pravastatin (PRAVACHOL) 80 MG tablet Take 1 tablet (80 mg total) daily by mouth. 10/23/17  Yes Reed, [name] L, DO  traMADol (ULTRAM) 50 MG tablet Take 50 mg by mouth at bedtime as needed for pain.   Yes [provider]  ursodiol (ACTIGALL) 300 MG capsule Take 1 capsule (300 mg total) by mouth 2 (two) times daily. 01/08/18 02/07/18  Virgel Manifold, MD    Family History  Problem Relation Age of Onset  . Stroke Father   . Atrial fibrillation Brother        on coumadin  . Heart disease Brother        AFib- coumadin  . Stroke Paternal Grandfather   . Cancer Other        colon cancer at early age  . Prostate cancer Neg Hx   . Kidney cancer Neg Hx   . Bladder Cancer Neg Hx      Social History   Tobacco Use  . Smoking status: Former Smoker    Years: 1.00  . Smokeless tobacco: Never Used  . Tobacco comment: stopped in 20's  Substance Use Topics  . Alcohol use: No  . Drug use: No    Allergies as of 01/08/2018 - Review Complete 01/08/2018  Allergen Reaction Noted  . Morphine and related Shortness Of Breath 04/15/2011  . Percocet [oxycodone-acetaminophen] Shortness Of Breath 04/15/2011  . Valium Shortness Of Breath 04/15/2011    Review of Systems:    All systems reviewed and negative except where noted in HPI.   Physical Exam:  BP (!) 146/75   Pulse 65   Ht _0  (1.702 m)   Wt 183 lb  12.8 oz (83.4 kg)   BMI 28.79 kg/m  No LMP for male patient. Psych:  Alert and cooperative. Normal mood and affect. General:   Alert,  Well-developed, well-nourished, pleasant and cooperative in NAD Head:  Normocephalic and atraumatic. Eyes:  Sclera clear, no icterus.   Conjunctiva pink. Ears:  Normal auditory acuity. Nose:  No deformity, discharge, or lesions. Mouth:  No deformity or lesions,oropharynx pink & moist. Neck:  Supple; no masses or thyromegaly. Lungs:  Respirations even and unlabored.  Clear throughout  to auscultation.   No wheezes, crackles, or rhonchi. No acute distress. Heart:  Regular rate and rhythm; no murmurs, clicks, rubs, or gallops. Abdomen:  Normal bowel sounds.  No bruits.  Soft, non-tender and non-distended without masses, hepatosplenomegaly or hernias noted.  No guarding or rebound tenderness.    Msk:  Symmetrical without gross deformities. Good, equal movement & strength bilaterally. Pulses:  Normal pulses noted. Extremities:  No clubbing or edema.  No cyanosis. Neurologic:  Alert and oriented x3;  grossly normal neurologically. Skin:  Intact without significant lesions or rashes. No jaundice. Lymph Nodes:  No significant cervical adenopathy. Psych:  Alert and cooperative. Normal mood and affect.   Labs: CBC    Component Value Date/Time   WBC 6.6 11/12/2017 2025   RBC 4.36 (L) 11/12/2017 2025   HGB 14.3 11/12/2017 2025   HGB 14.5 03/29/2017 1503   HCT 42.5 11/12/2017 2025   HCT 43.4 03/29/2017 1503   PLT 251 11/12/2017 2025   PLT 210 03/29/2017 1503   MCV 97.4 11/12/2017 2025   MCV 97 03/29/2017 1503   MCH 32.8 11/12/2017 2025   MCHC 33.6 11/12/2017 2025   RDW 13.2 11/12/2017 2025   RDW 13.7 03/29/2017 1503   LYMPHSABS 1,659 10/27/2017 1055   LYMPHSABS 2.4 05/16/2016 1035   MONOABS 0.5 10/20/2017 1410   EOSABS 191 10/27/2017 1055   EOSABS 0.1 05/16/2016 1035   BASOSABS 48 10/27/2017 1055   BASOSABS 0.0 05/16/2016 1035   CMP     Component  Value Date/Time   NA 139 01/08/2018 1152   NA 142 05/18/2017 1520   K 4.3 01/08/2018 1152   CL 104 01/08/2018 1152   CO2 27 01/08/2018 1152   GLUCOSE 85 01/08/2018 1152   BUN 22 (H) 01/08/2018 1152   BUN 23 07/12/2017 1442   CREATININE 1.25 (H) 01/08/2018 1152   CREATININE 1.27 (H) 10/27/2017 1055   CALCIUM 8.8 (L) 01/08/2018 1152   PROT 7.3 01/08/2018 1152   PROT 6.5 05/16/2016 1035   ALBUMIN 4.1 01/08/2018 1152   ALBUMIN 4.0 05/16/2016 1035   AST 56 (H) 01/08/2018 1152   ALT 62 01/08/2018 1152   ALKPHOS 43 01/08/2018 1152   BILITOT 0.8 01/08/2018 1152   BILITOT 0.8 05/16/2016 1035   GFRNONAA 51 (L) 01/08/2018 1152   GFRNONAA 52 (L) 10/27/2017 1055   GFRAA 60 (L) 01/08/2018 1152   GFRAA 60 10/27/2017 1055    Imaging Studies: No results found.  Assessment and Plan:   Traye Bates Wedel is a 84 y.o. y/o male has been referred for evaluation for ERCP, due to cholelithiasis seen on his CT scan done for intermittent right upper quadrant pain associated with meals with normal bilirubin and alk phos  Patient symptoms are likely due to biliary colic No indication for ERCP, with normal bilirubin and alk phos, and no reports of biliary duct obstruction or dilation reported on the CT scan. Clinical symptoms are not consistent with choledocholithiasis to indicate any need for ERCP (In addition ERCP carries its own risks including pancreatitis.  In addition, the family was under the impression that an ERCP would not require intubation/general anesthesia.  An ERCP does require intubation and general anesthesia.)  Treatment for biliary colic is cholecystectomy.  We will refer patient to surgery for evaluation, as he has not seen one to obtain their official opinion, to discuss if he is a candidate for surgery or not.   In the meantime, we discussed that ursodiol might help with his  symptoms.  As per up-to-date "In patients with no alternative explanation for atypical symptoms, we suggest an  empiric trial of ursodiol for three months to identify patients who will benefit from cholecystectomy. Ursodiol relieves symptoms in many patients within a few weeks, even though gallstones are still present, possibly by changing the viscosity of bile. Dissolution of gallstones is slow and therapy [place] be required for two or more years."  Patient and family are willing to try ursodiol, while awaiting surgery consultation.  Recommended doses 8-10 mg/kg/day.  We will start him on 300 mg twice daily as per his weight.  In addition, he also reports that at times his pain improves after a bowel movement.  His last CT scan also showed a stool burden.  Will start MiraLAX daily to maintain soft regular bowel movements, and see if that helps his pain as well.  We will also obtain H. pylori serology.  Patient is on PPI, and stool for H. pylori her H. pylori breath test can be false negative.   CT report of right hepatic hypodense lesion, reported to be a cyst or hemangioma, which are benign lesions.  These are unlike to be the source of his pain given that they are very small.  Can be followed with MRI or repeat imaging in the future, after surgery referral for biliary colic is complete.  Patient unlikely to tolerate an MRI exam at this time right upper quadrant pain at this time.  No clinical evidence of liver malignancy at this time, and CT did not report any high risk features to the lesion.  Dr Vonda Antigua

## 2018-01-09 LAB — HEPATITIS B SURFACE ANTIGEN: HEP B S AG: NEGATIVE

## 2018-01-09 LAB — H PYLORI, IGM, IGG, IGA AB
H Pylori IgG: <0.8 Index Value (ref 0.00–0.79)
H. Pylogi, Iga Abs: <9 units (ref 0.0–8.9)
H. Pylogi, Igm Abs: <9 units (ref 0.0–8.9)

## 2018-01-09 LAB — HEPATITIS C ANTIBODY: HCV Ab: <0.1 s/co ratio (ref 0.0–0.9)

## 2018-01-09 LAB — HEPATITIS B CORE ANTIBODY, IGM: Hep B C IgM: NEGATIVE

## 2018-01-09 LAB — HEPATITIS B SURFACE ANTIBODY,QUALITATIVE: HEP B S AB: NONREACTIVE

## 2018-01-09 LAB — HEPATITIS A ANTIBODY, IGM: Hep A IgM: NEGATIVE

## 2018-01-09 LAB — HEPATITIS A ANTIBODY, TOTAL: HEP A TOTAL AB: POSITIVE — AB

## 2018-01-10 DIAGNOSIS — I82409 Acute embolism and thrombosis of unspecified deep veins of unspecified lower extremity: Secondary | ICD-10-CM | POA: Diagnosis not present

## 2018-01-10 DIAGNOSIS — M5030 Other cervical disc degeneration, unspecified cervical region: Secondary | ICD-10-CM | POA: Diagnosis not present

## 2018-01-10 DIAGNOSIS — F329 Major depressive disorder, single episode, unspecified: Secondary | ICD-10-CM | POA: Diagnosis not present

## 2018-01-10 DIAGNOSIS — I5032 Chronic diastolic (congestive) heart failure: Secondary | ICD-10-CM | POA: Diagnosis not present

## 2018-01-10 DIAGNOSIS — I480 Paroxysmal atrial fibrillation: Secondary | ICD-10-CM | POA: Diagnosis not present

## 2018-01-10 DIAGNOSIS — N183 Chronic kidney disease, stage 3 (moderate): Secondary | ICD-10-CM | POA: Diagnosis not present

## 2018-01-10 DIAGNOSIS — Z7901 Long term (current) use of anticoagulants: Secondary | ICD-10-CM | POA: Diagnosis not present

## 2018-01-10 DIAGNOSIS — F419 Anxiety disorder, unspecified: Secondary | ICD-10-CM | POA: Diagnosis not present

## 2018-01-10 DIAGNOSIS — Z9981 Dependence on supplemental oxygen: Secondary | ICD-10-CM | POA: Diagnosis not present

## 2018-01-10 DIAGNOSIS — I130 Hypertensive heart and chronic kidney disease with heart failure and stage 1 through stage 4 chronic kidney disease, or unspecified chronic kidney disease: Secondary | ICD-10-CM | POA: Diagnosis not present

## 2018-01-10 DIAGNOSIS — G959 Disease of spinal cord, unspecified: Secondary | ICD-10-CM | POA: Diagnosis not present

## 2018-01-10 DIAGNOSIS — E782 Mixed hyperlipidemia: Secondary | ICD-10-CM | POA: Diagnosis not present

## 2018-01-10 DIAGNOSIS — M1991 Primary osteoarthritis, unspecified site: Secondary | ICD-10-CM | POA: Diagnosis not present

## 2018-01-11 ENCOUNTER — Other Ambulatory Visit: Payer: Self-pay | Admitting: *Deleted

## 2018-01-11 MED ORDER — GABAPENTIN 300 MG PO CAPS: 300.0000 mg | ORAL_CAPSULE | Freq: Two times a day (BID) | ORAL | 1 refills | Status: DC

## 2018-01-11 NOTE — Telephone Encounter (Signed)
Patient requested. Faxed to pharmacy.  

## 2018-01-16 ENCOUNTER — Other Ambulatory Visit: Payer: Self-pay

## 2018-01-16 ENCOUNTER — Telehealth: Payer: Self-pay | Admitting: Internal Medicine

## 2018-01-16 NOTE — Telephone Encounter (Signed)
I called the patient to schedule AWV, but the line was busy.  Will try again later. VDM (DD)

## 2018-01-17 ENCOUNTER — Telehealth: Payer: Self-pay

## 2018-01-17 ENCOUNTER — Telehealth: Payer: Self-pay | Admitting: *Deleted

## 2018-01-17 DIAGNOSIS — E782 Mixed hyperlipidemia: Secondary | ICD-10-CM | POA: Diagnosis not present

## 2018-01-17 DIAGNOSIS — Z7901 Long term (current) use of anticoagulants: Secondary | ICD-10-CM | POA: Diagnosis not present

## 2018-01-17 DIAGNOSIS — I480 Paroxysmal atrial fibrillation: Secondary | ICD-10-CM | POA: Diagnosis not present

## 2018-01-17 DIAGNOSIS — I82409 Acute embolism and thrombosis of unspecified deep veins of unspecified lower extremity: Secondary | ICD-10-CM | POA: Diagnosis not present

## 2018-01-17 DIAGNOSIS — M5030 Other cervical disc degeneration, unspecified cervical region: Secondary | ICD-10-CM | POA: Diagnosis not present

## 2018-01-17 DIAGNOSIS — M1991 Primary osteoarthritis, unspecified site: Secondary | ICD-10-CM | POA: Diagnosis not present

## 2018-01-17 DIAGNOSIS — G959 Disease of spinal cord, unspecified: Secondary | ICD-10-CM | POA: Diagnosis not present

## 2018-01-17 DIAGNOSIS — I5032 Chronic diastolic (congestive) heart failure: Secondary | ICD-10-CM | POA: Diagnosis not present

## 2018-01-17 DIAGNOSIS — F329 Major depressive disorder, single episode, unspecified: Secondary | ICD-10-CM | POA: Diagnosis not present

## 2018-01-17 DIAGNOSIS — K8050 Calculus of bile duct without cholangitis or cholecystitis without obstruction: Secondary | ICD-10-CM

## 2018-01-17 DIAGNOSIS — I130 Hypertensive heart and chronic kidney disease with heart failure and stage 1 through stage 4 chronic kidney disease, or unspecified chronic kidney disease: Secondary | ICD-10-CM | POA: Diagnosis not present

## 2018-01-17 DIAGNOSIS — F419 Anxiety disorder, unspecified: Secondary | ICD-10-CM | POA: Diagnosis not present

## 2018-01-17 DIAGNOSIS — Z9981 Dependence on supplemental oxygen: Secondary | ICD-10-CM | POA: Diagnosis not present

## 2018-01-17 DIAGNOSIS — N183 Chronic kidney disease, stage 3 (moderate): Secondary | ICD-10-CM | POA: Diagnosis not present

## 2018-01-17 NOTE — Telephone Encounter (Signed)
Advised patient of results per Dr. Bonna Gains.   - labs did not show any bacteria and his viral hepatitis testing showed that he is immune to Hepatitis A. He should follow up with Surgery as we discussed in clinic.   Submitted referral to Dr. Dahlia Byes for: Treatment for biliary colic is cholecystectomy.  We will refer patient to surgery for evaluation, as he has not seen one to obtain their official opinion, to discuss if he is a candidate for surgery or not.

## 2018-01-17 NOTE — Telephone Encounter (Signed)
Patients daughter called and is very upset because she got a call from her daddy stating that he was referred to a surgeons office. She wants to know some answers and she stated that she wants a call back today. She drives a school bus and [place] can not answer right away but to leave her a message.

## 2018-01-18 ENCOUNTER — Other Ambulatory Visit: Payer: Self-pay | Admitting: *Deleted

## 2018-01-22 DIAGNOSIS — R0602 Shortness of breath: Secondary | ICD-10-CM | POA: Diagnosis not present

## 2018-01-22 DIAGNOSIS — I5032 Chronic diastolic (congestive) heart failure: Secondary | ICD-10-CM | POA: Diagnosis not present

## 2018-01-24 DIAGNOSIS — I130 Hypertensive heart and chronic kidney disease with heart failure and stage 1 through stage 4 chronic kidney disease, or unspecified chronic kidney disease: Secondary | ICD-10-CM | POA: Diagnosis not present

## 2018-01-24 DIAGNOSIS — F329 Major depressive disorder, single episode, unspecified: Secondary | ICD-10-CM | POA: Diagnosis not present

## 2018-01-24 DIAGNOSIS — M1991 Primary osteoarthritis, unspecified site: Secondary | ICD-10-CM | POA: Diagnosis not present

## 2018-01-24 DIAGNOSIS — E782 Mixed hyperlipidemia: Secondary | ICD-10-CM | POA: Diagnosis not present

## 2018-01-24 DIAGNOSIS — M5030 Other cervical disc degeneration, unspecified cervical region: Secondary | ICD-10-CM | POA: Diagnosis not present

## 2018-01-24 DIAGNOSIS — F419 Anxiety disorder, unspecified: Secondary | ICD-10-CM | POA: Diagnosis not present

## 2018-01-24 DIAGNOSIS — Z7901 Long term (current) use of anticoagulants: Secondary | ICD-10-CM | POA: Diagnosis not present

## 2018-01-24 DIAGNOSIS — I82409 Acute embolism and thrombosis of unspecified deep veins of unspecified lower extremity: Secondary | ICD-10-CM | POA: Diagnosis not present

## 2018-01-24 DIAGNOSIS — N183 Chronic kidney disease, stage 3 (moderate): Secondary | ICD-10-CM | POA: Diagnosis not present

## 2018-01-24 DIAGNOSIS — Z9981 Dependence on supplemental oxygen: Secondary | ICD-10-CM | POA: Diagnosis not present

## 2018-01-24 DIAGNOSIS — G959 Disease of spinal cord, unspecified: Secondary | ICD-10-CM | POA: Diagnosis not present

## 2018-01-24 DIAGNOSIS — I5032 Chronic diastolic (congestive) heart failure: Secondary | ICD-10-CM | POA: Diagnosis not present

## 2018-01-24 DIAGNOSIS — I480 Paroxysmal atrial fibrillation: Secondary | ICD-10-CM | POA: Diagnosis not present

## 2018-01-26 DIAGNOSIS — M25572 Pain in left ankle and joints of left foot: Secondary | ICD-10-CM | POA: Diagnosis not present

## 2018-01-31 DIAGNOSIS — Z7901 Long term (current) use of anticoagulants: Secondary | ICD-10-CM | POA: Diagnosis not present

## 2018-01-31 DIAGNOSIS — E782 Mixed hyperlipidemia: Secondary | ICD-10-CM | POA: Diagnosis not present

## 2018-01-31 DIAGNOSIS — Z9981 Dependence on supplemental oxygen: Secondary | ICD-10-CM | POA: Diagnosis not present

## 2018-01-31 DIAGNOSIS — F329 Major depressive disorder, single episode, unspecified: Secondary | ICD-10-CM | POA: Diagnosis not present

## 2018-01-31 DIAGNOSIS — G959 Disease of spinal cord, unspecified: Secondary | ICD-10-CM | POA: Diagnosis not present

## 2018-01-31 DIAGNOSIS — N183 Chronic kidney disease, stage 3 (moderate): Secondary | ICD-10-CM | POA: Diagnosis not present

## 2018-01-31 DIAGNOSIS — M1991 Primary osteoarthritis, unspecified site: Secondary | ICD-10-CM | POA: Diagnosis not present

## 2018-01-31 DIAGNOSIS — I5032 Chronic diastolic (congestive) heart failure: Secondary | ICD-10-CM | POA: Diagnosis not present

## 2018-01-31 DIAGNOSIS — F419 Anxiety disorder, unspecified: Secondary | ICD-10-CM | POA: Diagnosis not present

## 2018-01-31 DIAGNOSIS — I480 Paroxysmal atrial fibrillation: Secondary | ICD-10-CM | POA: Diagnosis not present

## 2018-01-31 DIAGNOSIS — I130 Hypertensive heart and chronic kidney disease with heart failure and stage 1 through stage 4 chronic kidney disease, or unspecified chronic kidney disease: Secondary | ICD-10-CM | POA: Diagnosis not present

## 2018-01-31 DIAGNOSIS — M5030 Other cervical disc degeneration, unspecified cervical region: Secondary | ICD-10-CM | POA: Diagnosis not present

## 2018-01-31 DIAGNOSIS — I82409 Acute embolism and thrombosis of unspecified deep veins of unspecified lower extremity: Secondary | ICD-10-CM | POA: Diagnosis not present

## 2018-02-01 ENCOUNTER — Ambulatory Visit (INDEPENDENT_AMBULATORY_CARE_PROVIDER_SITE_OTHER): Payer: PPO | Admitting: General Surgery

## 2018-02-01 ENCOUNTER — Encounter: Payer: Self-pay | Admitting: General Surgery

## 2018-02-01 VITALS — BP 144/70 | HR 54 | Temp 97.7°F | Ht 67.0 in | Wt 182.0 lb

## 2018-02-01 DIAGNOSIS — K8020 Calculus of gallbladder without cholecystitis without obstruction: Secondary | ICD-10-CM | POA: Diagnosis not present

## 2018-02-01 DIAGNOSIS — R1011 Right upper quadrant pain: Secondary | ICD-10-CM | POA: Diagnosis not present

## 2018-02-01 NOTE — Progress Notes (Signed)
Patient ID: Eric [name], male   DOB: 26-Sep-1932, 84 y.o.   MRN: [id number]  CC: Abdominal pain  HPI Eric [name] is a 84 y.o. male who presents to clinic today for evaluation of 3 months of abdominal pain.  Outpatient consultation was requested by Dr. Bonna Gains for the above-stated abdominal pain in the setting of cholelithiasis.  Patient reports that approximately 3 months ago he had severe onset of nausea, vomiting followed by chest pain which prompted him to be seen in the emergency department.  The workup in the emergency department showed cholelithiasis.  He has numerous medical problems listed below and his primary care provider sent him to gastroenterology for evaluation.  Gastroenterology has sent him here for evaluation.  Upon questioning patient reports that he has had constant discomfort to the right upper quadrant and right flank since December.  He is a very poor historian and cannot correlate dietary intake with his pain.  He does report that every time he eats he has the urge to have a bowel movement.  He has been bloated and distended for the last several months as well.  He has not had pain or nausea like he did in December since that trip to the ER.  He denies any fevers, chills, chest pain, shortness of breath, nausea, vomiting.  He states he has daily bowel movements but he has had a history of constipation.  HPI  Past Medical History:  Diagnosis Date  . Anxiety   . Arthritis   . Atrial fibrillation ([place]) 05/28/2007    Qualifier: Diagnosis of  By: Council Mechanic MD, Hilaria Ota    . Atypical chest pain    a. 05/2017 MV: no ischemia, EF 79%.  . Chest pain 10/20/2017  . Chronic diastolic CHF (congestive heart failure) ([place])    a. 01/2011 Echo: EF 50-55%, gr1 DD, mild AI, nl RV fxn, mild TR/PR; b. 10/2017 Echo: EF 60-65%, mild LVH, gr2 DD.  Marland Kitchen DDD (degenerative disc disease), cervical   . Depressive disorder, not elsewhere classified   . Difficult intubation   . Dysphagia, oral phase   .  Dyspnea   . Edema   . History of DVT (deep vein thrombosis)   . History of kidney stones   . Hypertension   . HYPERTENSION, BENIGN ESSENTIAL 05/28/2007   Qualifier: Diagnosis of  By: Council Mechanic MD, Hilaria Ota   . Hypoxemia   . Impacted cerumen   . Long term current use of anticoagulant 03/02/2011  . LOW BACK PAIN SYNDROME 03/17/2009   Qualifier: Diagnosis of  By: Council Mechanic MD, Hilaria Ota   . Mixed hyperlipidemia   . 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 Other myelopathy   . Pain in limb   . Palpitations   . Paroxysmal Atrial Fibrillation ([place])    a. a. 01/2011 in setting of post-op complications including aspiration pna;  b. CHA2DS2VASc = 4--> Amio/Eliquis.  . Pneumonia 03/06/2003  . Spinal stenosis, unspecified region other than cervical   . 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 Syncope    a. 10/2017-->Event monitor: RSR, rare PACs/PVCs.  . Syncope and collapse 10/20/2017  . Thoracic or lumbosacral neuritis or radiculitis, unspecified     Past Surgical History:  Procedure Laterality Date  . CARDIOVERSION N/A 06/20/2017   Procedure: CARDIOVERSION;  Surgeon: [name], MD;  Location: [facility] ORS;  Service: Cardiovascular;  Laterality: N/A;  .  CATARACT EXTRACTION W/ INTRAOCULAR LENS  IMPLANT, BILATERAL    . CERVICAL FUSION  02/10/2011  . history of abd ultrasound  11/01   fatty liver  . MULTIPLE TOOTH EXTRACTIONS    . ORIF FIBULA FRACTURE Left 01/06/2017   Procedure: OPEN REDUCTION INTERNAL FIXATION (ORIF) FIBULA FRACTURE DISTAL FIBULA;  Surgeon: Melrose Nakayama, MD;  Location: [facility];  Service: Orthopedics;  Laterality: Left;  Patient states has problems if he will have a tube in throat for Genera; Anesthesia    Family History  Problem Relation Age of Onset  . Stroke Father   . Atrial fibrillation Brother        on coumadin  . Heart disease Brother        AFib- coumadin  . Stroke Paternal Grandfather   .  Cancer Other        colon cancer at early age  . Prostate cancer Neg Hx   . Kidney cancer Neg Hx   . Bladder Cancer Neg Hx     Social History Social History   Tobacco Use  . Smoking status: Former Smoker    Years: 1.00  . Smokeless tobacco: Never Used  . Tobacco comment: stopped in 20's  Substance Use Topics  . Alcohol use: No  . Drug use: No    Allergies  Allergen Reactions  . Morphine And Related Shortness Of Breath  . Percocet [Oxycodone-Acetaminophen] Shortness Of Breath  . Valium Shortness Of Breath    Current Outpatient Medications  Medication Sig Dispense Refill  . amiodarone (PACERONE) 200 MG tablet Take 1 tablet (200 mg total) by mouth daily. 30 tablet 3  . apixaban (ELIQUIS) 5 MG TABS tablet Take 1 tablet (5 mg total) by mouth 2 (two) times daily. 60 tablet 6  . cetirizine (ZYRTEC) 10 MG tablet Take 1 tablet (10 mg total) by mouth daily. 90 tablet 1  . Cholecalciferol (VITAMIN D) 1000 UNITS capsule Take one tablet once daily 90 capsule 3  . fluticasone (FLONASE) 50 MCG/ACT nasal spray Place 2 sprays daily into both nostrils. 16 g 10  . furosemide (LASIX) 40 MG tablet Take 40 mg 2 (two) times daily by mouth.    . gabapentin (NEURONTIN) 300 MG capsule Take 1 capsule (300 mg total) by mouth 2 (two) times daily. 180 capsule 1  . metoprolol tartrate (LOPRESSOR) 25 MG tablet Take 1 tablet (25 mg total) 2 (two) times daily by mouth. 60 tablet 11  . pantoprazole (PROTONIX) 40 MG tablet Take 1 tablet (40 mg total) by mouth at bedtime. 30 tablet 3  . potassium chloride SA (K-DUR,KLOR-CON) 20 MEQ tablet Take 0.5 tablets (10 mEq total) 2 (two) times daily by mouth. 90 tablet 3  . pravastatin (PRAVACHOL) 80 MG tablet Take 1 tablet (80 mg total) daily by mouth. 90 tablet 1  . traMADol (ULTRAM) 50 MG tablet Take 50 mg by mouth at bedtime as needed for pain.    . ursodiol (ACTIGALL) 300 MG capsule Take 1 capsule (300 mg total) by mouth 2 (two) times daily. 60 capsule 3   No  current facility-administered medications for this visit.      Review of Systems A multi-point review of systems was asked and was negative except for the findings documented in the HPI  Physical Exam Blood pressure (!) 144/70, pulse (!) 54, temperature 97.7 F (36.5 C), temperature source Oral, height 5' 7"  (1.702 m), weight 82.6 kg (182 lb). CONSTITUTIONAL: No acute distress. EYES: Pupils are equal, round, and reactive to  light, Sclera are non-icteric. EARS, NOSE, MOUTH AND THROAT: The oropharynx is clear. The oral mucosa is pink and moist. Hearing is intact to voice. LYMPH NODES:  Lymph nodes in the neck are normal. RESPIRATORY:  Lungs are clear. There is normal respiratory effort, with equal breath sounds bilaterally, and without pathologic use of accessory muscles. CARDIOVASCULAR: Heart is regular without murmurs, gallops, or rubs. GI: The abdomen is soft, nontender, and nondistended. There are no palpable masses. There is no hepatosplenomegaly. There are normal bowel sounds in all quadrants. GU: Rectal deferred.   MUSCULOSKELETAL: Normal muscle strength and tone. No cyanosis or edema.   SKIN: Turgor is good and there are no pathologic skin lesions or ulcers. NEUROLOGIC: Motor and sensation is grossly normal. Cranial nerves are grossly intact. PSYCH:  Oriented to person, place and time. Affect is normal.  Data Reviewed Images and labs reviewed with his most recent labs being performed last month, LFTs showing a mildly elevated AST of 56 but a normal bilirubin, normal ALT, normal alkaline phosphatase.  He does have minimally elevated creatinine and BUN of 1.25 and 22.  The remainder of his labs are within normal limits.  When he was seen in the ER 3 months ago he also had normal bilirubin, alk phos, ALT with minimally elevated AST.  Normal white blood cell count of 6.6 and normal platelet count of 251.  Patient had a CT scan performed in December which showed cholelithiasis without  cholecystitis.  It also showed a distended stomach and large volume stool in the right colon and transverse colon. I have personally reviewed the patient's imaging, laboratory findings and medical records.    Assessment    Abdominal pain    Plan    84 year old male with an inconsistent history.  Although he does have cholelithiasis the history provided is not firmly established that he has symptomatic cholelithiasis or biliary colic.  The pain happens whether he eats or not and he also has what sounds like a dumping syndrome response to eating.  Given his benign exam and inconsistent history discussed that we could further evaluate his biliary function with a HIDA scan.  Patient is also on Eliquis for history of A. fib but he is not currently in A. fib.  He has not seen his cardiologist in over a month and we will contact the cardiology clinic for both cardiac clearance for surgery and approval to stop the blood thinner should he be deemed a candidate for a cholecystectomy.  Also discussed with the patient and his family my pending departure from the group so his follow-up will be with my partner, Dr. Hampton Abbot to review his findings and to discuss further steps or whether or not he is a candidate for surgery.  All questions answered to the patient's and his family satisfaction.  Counseled should his pain worsen to the point where he could not handle anymore that he would need to be admitted for an inpatient workup.     Time spent with the patient was 60 minutes, with more than 50% of the time spent in face-to-face education, counseling and care coordination.     Clayburn Pert, MD FACS General Surgeon 02/01/2018, 11:38 AM

## 2018-02-01 NOTE — Patient Instructions (Addendum)
We have scheduled you for a HIDA Scan to look at the function of your gallbladder. This has been scheduled for 03/0/2019 at [facility].  Bring a list of medications with you to your appointment and have nothing to eat or drink after midnight prior to your testing. Do NOT take any narcotic pain medication within 24 hours of this test, as it will alter the results of the HIDA Scan.  If you need to reschedule your Scan, you Goldman do so by calling [phone].   Low-Fat Diet for Pancreatitis or Gallbladder Conditions A low-fat diet can be helpful if you have pancreatitis or a gallbladder condition. With these conditions, your pancreas and gallbladder have trouble digesting fats. A healthy eating plan with less fat will help rest your pancreas and gallbladder and reduce your symptoms. What do I need to know about this diet?  Eat a low-fat diet. ? Reduce your fat intake to less than 20-30% of your total daily calories. This is less than 50-60 g of fat per day. ? Remember that you need some fat in your diet. Ask your dietician what your daily goal should be. ? Choose nonfat and low-fat healthy foods. Look for the words "nonfat," "low fat," or "fat free." ? As a guide, look on the label and choose foods with less than 3 g of fat per serving. Eat only one serving.  Avoid alcohol.  Do not smoke. If you need help quitting, talk with your health care provider.  Eat small frequent meals instead of three large heavy meals. What foods can I eat? Grains Include healthy grains and starches such as potatoes, wheat bread, fiber-rich cereal, and brown rice. Choose whole grain options whenever possible. In adults, whole grains should account for 45-65% of your daily calories. Fruits and Vegetables Eat plenty of fruits and vegetables. Fresh fruits and vegetables add fiber to your diet. Meats and Other Protein Sources Eat lean meat such as chicken and pork. Trim any fat off of meat before cooking it.  Eggs, fish, and beans are other sources of protein. In adults, these foods should account for 10-35% of your daily calories. Dairy Choose low-fat milk and dairy options. Dairy includes fat and protein, as well as calcium. Fats and Oils Limit high-fat foods such as fried foods, sweets, baked goods, sugary drinks. Other Creamy sauces and condiments, such as mayonnaise, can add extra fat. Think about whether or not you need to use them, or use smaller amounts or low fat options. What foods are not recommended?  High fat foods, such as: ? [employer]. ? Ice cream. ? [place] toast. ? Sweet rolls. ? Pizza. ? Cheese bread. ? Foods covered with batter, butter, creamy sauces, or cheese. ? Fried foods. ? Sugary drinks and desserts.  Foods that cause gas or bloating This information is not intended to replace advice given to you by your health care provider. Make sure you discuss any questions you have with your health care provider. Document Released: 12/03/2013 Document Revised: 05/05/2016 Document Reviewed: 11/11/2013 Elsevier Interactive Patient Education  2017 [employer].

## 2018-02-07 ENCOUNTER — Telehealth: Payer: Self-pay | Admitting: *Deleted

## 2018-02-07 NOTE — Telephone Encounter (Signed)
Received fax from [facility] [phone] stating the effective date coverage of services ([facility]) will End: two additional visits to be completed no later that 02/12/18.  Patient #:[id number] Placed in Dr. Mariea Clonts folder for review.

## 2018-02-12 ENCOUNTER — Ambulatory Visit: Payer: PPO

## 2018-02-13 ENCOUNTER — Telehealth: Payer: Self-pay

## 2018-02-13 NOTE — Telephone Encounter (Signed)
Cardiac and anti-coagulant clearance forms were faxed to Dr. Donivan Scull office. Awaiting for response.

## 2018-02-14 ENCOUNTER — Telehealth: Payer: Self-pay | Admitting: Cardiovascular Disease

## 2018-02-14 NOTE — Telephone Encounter (Signed)
Spoke with patients daughter per release form and reviewed clearance information and that this was also sent back over to surgeon's office.  She verbalized understanding with no further questions at this time.

## 2018-02-14 NOTE — Telephone Encounter (Signed)
Message routed to number listed. 

## 2018-02-14 NOTE — Telephone Encounter (Signed)
Acceptable risk for surgery Would hold the eliquis 2 days prior to surgery Restart anticoagulation when approved by surgical team

## 2018-02-14 NOTE — Telephone Encounter (Signed)
   [facility] Pre-operative Risk Assessment    Request for surgical clearance:  1. What type of surgery is being performed? cholecystectomy  2. When is this surgery scheduled? Not listed  3. What type of clearance is required (medical clearance vs. Pharmacy clearance to hold med vs. Both)? Not listed  4. Are there any medications that need to be held prior to surgery and how long? Not listed  5. Practice name and name of physician performing surgery? [facility]   6. What is your office phone and fax number? [phone], fax [phone]  7. Anesthesia type (None, local, MAC, general) ? Not listed   Eric [name] 02/14/2018, 8:43 AM  _________________________________________________________________   (provider comments below)

## 2018-02-14 NOTE — Telephone Encounter (Signed)
Left voicemail message to call back  

## 2018-02-15 ENCOUNTER — Ambulatory Visit: Payer: PPO | Admitting: Surgery

## 2018-02-15 ENCOUNTER — Encounter
Admission: RE | Admit: 2018-02-15 | Discharge: 2018-02-15 | Disposition: A | Discharge status: Home / Self Care | Payer: PPO | Source: Ambulatory Visit | Attending: General Surgery | Admitting: General Surgery

## 2018-02-15 DIAGNOSIS — R932 Abnormal findings on diagnostic imaging of liver and biliary tract: Secondary | ICD-10-CM | POA: Diagnosis not present

## 2018-02-15 DIAGNOSIS — K8020 Calculus of gallbladder without cholecystitis without obstruction: Secondary | ICD-10-CM

## 2018-02-15 DIAGNOSIS — R112 Nausea with vomiting, unspecified: Secondary | ICD-10-CM | POA: Diagnosis not present

## 2018-02-15 MED ORDER — TECHNETIUM TC 99M MEBROFENIN IV KIT
5.0900 | PACK | Freq: Once | INTRAVENOUS | Status: AC | PRN
  Administered 2018-02-15: 5.09 via INTRAVENOUS

## 2018-02-16 ENCOUNTER — Encounter: Payer: Self-pay | Admitting: Surgery

## 2018-02-16 ENCOUNTER — Ambulatory Visit (INDEPENDENT_AMBULATORY_CARE_PROVIDER_SITE_OTHER): Payer: PPO | Admitting: Surgery

## 2018-02-16 VITALS — BP 144/73 | HR 53 | Temp 98.0°F | Ht 67.0 in | Wt 180.0 lb

## 2018-02-16 DIAGNOSIS — K8020 Calculus of gallbladder without cholecystitis without obstruction: Secondary | ICD-10-CM | POA: Diagnosis not present

## 2018-02-16 NOTE — Patient Instructions (Addendum)
We will see you back as listed below:  Continue a low-fat diet:  Low-Fat Diet for Pancreatitis or Gallbladder Conditions A low-fat diet can be helpful if you have pancreatitis or a gallbladder condition. With these conditions, your pancreas and gallbladder have trouble digesting fats. A healthy eating plan with less fat will help rest your pancreas and gallbladder and reduce your symptoms. What do I need to know about this diet?  Eat a low-fat diet. ? Reduce your fat intake to less than 20-30% of your total daily calories. This is less than 50-60 g of fat per day. ? Remember that you need some fat in your diet. Ask your dietician what your daily goal should be. ? Choose nonfat and low-fat healthy foods. Look for the words "nonfat," "low fat," or "fat free." ? As a guide, look on the label and choose foods with less than 3 g of fat per serving. Eat only one serving.  Avoid alcohol.  Do not smoke. If you need help quitting, talk with your health care provider.  Eat small frequent meals instead of three large heavy meals. What foods can I eat? Grains Include healthy grains and starches such as potatoes, wheat bread, fiber-rich cereal, and brown rice. Choose whole grain options whenever possible. In adults, whole grains should account for 45-65% of your daily calories. Fruits and Vegetables Eat plenty of fruits and vegetables. Fresh fruits and vegetables add fiber to your diet. Meats and Other Protein Sources Eat lean meat such as chicken and pork. Trim any fat off of meat before cooking it. Eggs, fish, and beans are other sources of protein. In adults, these foods should account for 10-35% of your daily calories. Dairy Choose low-fat milk and dairy options. Dairy includes fat and protein, as well as calcium. Fats and Oils Limit high-fat foods such as fried foods, sweets, baked goods, sugary drinks. Other Creamy sauces and condiments, such as mayonnaise, can add extra fat. Think about  whether or not you need to use them, or use smaller amounts or low fat options. What foods are not recommended?  High fat foods, such as: ? [employer]. ? Ice cream. ? [place] toast. ? Sweet rolls. ? Pizza. ? Cheese bread. ? Foods covered with batter, butter, creamy sauces, or cheese. ? Fried foods. ? Sugary drinks and desserts.  Foods that cause gas or bloating This information is not intended to replace advice given to you by your health care provider. Make sure you discuss any questions you have with your health care provider. Document Released: 12/03/2013 Document Revised: 05/05/2016 Document Reviewed: 11/11/2013 Elsevier Interactive Patient Education  2017 [employer].   If you have any questions or concerns please give our office a call.

## 2018-02-16 NOTE — Progress Notes (Signed)
02/16/2018  History of Present Illness: Eric [name] is a 84 y.o. male who presents with a history of right upper quadrant abdominal pain.  He was initially referred by Dr. Bonna Gains and she referred him to [place] for surgical evaluation.  He saw Dr. Adonis Huguenin on 2/21 and he ordered a HIDA scan.  HIDA scan was done on 3/7 and showed a patent cystic duct, but a low gallbladder EF of 22%, consistent with biliary dyskinesia.  The patient reports that since the first episode of abdominal pain that brought him to the emergency room on 11/12/17, he has not had that severe flare ups since.  He does report some mild pain on the right upper quadrant that comes and goes.  It is not necessarily associated with po intake.  The pain does not wake him up at night.  He has been watching what he eats and denies any nausea or vomiting.  Denies any constipation or diarrhea.  He takes Gabapentin for neuropathy pain and this helps him for the right upper quadrant pain too.  Past Medical History: Past Medical History:  Diagnosis Date  . Anxiety   . Arthritis   . Atrial fibrillation ([place]) 05/28/2007    Qualifier: Diagnosis of  By: Council Mechanic MD, Hilaria Ota    . Atypical chest pain    a. 05/2017 MV: no ischemia, EF 79%.  . Chest pain 10/20/2017  . Chronic diastolic CHF (congestive heart failure) ([place])    a. 01/2011 Echo: EF 50-55%, gr1 DD, mild AI, nl RV fxn, mild TR/PR; b. 10/2017 Echo: EF 60-65%, mild LVH, gr2 DD.  Marland Kitchen DDD (degenerative disc disease), cervical   . Depressive disorder, not elsewhere classified   . Difficult intubation   . Dysphagia, oral phase   . Dyspnea   . Edema   . History of DVT (deep vein thrombosis)   . History of kidney stones   . Hypertension   . HYPERTENSION, BENIGN ESSENTIAL 05/28/2007   Qualifier: Diagnosis of  By: Council Mechanic MD, Hilaria Ota   . Hypoxemia   . Impacted cerumen   . Long term current use of anticoagulant 03/02/2011  . LOW BACK PAIN SYNDROME 03/17/2009   Qualifier: Diagnosis of  By:  Council Mechanic MD, Hilaria Ota   . Mixed hyperlipidemia   . 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 Other myelopathy   . Pain in limb   . Palpitations   . Paroxysmal Atrial Fibrillation ([place])    a. a. 01/2011 in setting of post-op complications including aspiration pna;  b. CHA2DS2VASc = 4--> Amio/Eliquis.  . Pneumonia 03/06/2003  . Spinal stenosis, unspecified region other than cervical   . 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 Syncope    a. 10/2017-->Event monitor: RSR, rare PACs/PVCs.  . Syncope and collapse 10/20/2017  . Thoracic or lumbosacral neuritis or radiculitis, unspecified      Past Surgical History: Past Surgical History:  Procedure Laterality Date  . CARDIOVERSION N/A 06/20/2017   Procedure: CARDIOVERSION;  Surgeon: [name], MD;  Location: [facility] ORS;  Service: Cardiovascular;  Laterality: N/A;  . CATARACT EXTRACTION W/ INTRAOCULAR LENS  IMPLANT, BILATERAL    . CERVICAL FUSION  02/10/2011  . history of abd ultrasound  11/01   fatty liver  . MULTIPLE TOOTH EXTRACTIONS    . ORIF FIBULA FRACTURE Left 01/06/2017   Procedure: OPEN REDUCTION INTERNAL FIXATION (ORIF) FIBULA FRACTURE DISTAL FIBULA;  Surgeon: Melrose Nakayama, MD;  Location: [place];  Service: Orthopedics;  Laterality: Left;  Patient states has problems if he will have a tube in throat for Genera; Anesthesia    Home Medications: Prior to Admission medications   Medication Sig Start Date End Date Taking? Authorizing Provider  amiodarone (PACERONE) 200 MG tablet Take 1 tablet (200 mg total) by mouth daily. 12/11/17  Yes Gollan, Kathlene November, MD  apixaban (ELIQUIS) 5 MG TABS tablet Take 1 tablet (5 mg total) by mouth 2 (two) times daily. 04/24/17  Yes [name], MD  cetirizine (ZYRTEC) 10 MG tablet Take 1 tablet (10 mg total) by mouth daily. 11/01/17  Yes Lauree Chandler, NP  Cholecalciferol (VITAMIN D) 1000 UNITS capsule Take one tablet  once daily 08/09/13  Yes Reed, [name] L, DO  fluticasone (FLONASE) 50 MCG/ACT nasal spray Place 2 sprays daily into both nostrils. 10/27/17 10/27/18 Yes Lauree Chandler, NP  furosemide (LASIX) 40 MG tablet Take 40 mg 2 (two) times daily by mouth.   Yes [provider]  gabapentin (NEURONTIN) 300 MG capsule Take 1 capsule (300 mg total) by mouth 2 (two) times daily. 01/11/18  Yes Reed, [name] L, DO  metoprolol tartrate (LOPRESSOR) 25 MG tablet Take 1 tablet (25 mg total) 2 (two) times daily by mouth. 10/21/17 10/21/18 Yes Georgette Shell, MD  pantoprazole (PROTONIX) 40 MG tablet Take 1 tablet (40 mg total) by mouth at bedtime. 11/01/17  Yes Lauree Chandler, NP  potassium chloride SA (K-DUR,KLOR-CON) 20 MEQ tablet Take 0.5 tablets (10 mEq total) 2 (two) times daily by mouth. 10/21/17  Yes Georgette Shell, MD  pravastatin (PRAVACHOL) 80 MG tablet Take 1 tablet (80 mg total) daily by mouth. 10/23/17  Yes Reed, [name] L, DO  traMADol (ULTRAM) 50 MG tablet Take 50 mg by mouth at bedtime as needed for pain.   Yes [provider]    Allergies: Allergies  Allergen Reactions  . Morphine And Related Shortness Of Breath  . Percocet [Oxycodone-Acetaminophen] Shortness Of Breath  . Valium Shortness Of Breath   Review of Systems: Review of Systems  Constitutional: Negative for chills and fever.  Respiratory: Negative for shortness of breath.   Cardiovascular: Negative for chest pain.  Gastrointestinal: Positive for abdominal pain. Negative for constipation, diarrhea, nausea and vomiting.    Physical Exam BP (!) 144/73   Pulse (!) 53   Temp 98 F (36.7 C) (Oral)   Ht 5\' 7"  (1.702 m)   Wt 81.6 kg (180 lb)   BMI 28.19 kg/m  CONSTITUTIONAL: No acute distress RESPIRATORY:  Lungs are clear, and breath sounds are equal bilaterally. Normal respiratory effort without pathologic use of accessory muscles. CARDIOVASCULAR: Heart is regular without murmurs, gallops, or  rubs. GI: The abdomen is soft, nondistended, with mild discomfort over the right upper quadrant on palpation.  He reports pain about 5 of 10. There were no palpable masses.  MUSCULOSKELETAL:  Decreased strength related to recent rehab stay.  He uses a walker for ambulation. SKIN: Skin turgor is normal. There are no pathologic skin lesions.  NEUROLOGIC:  Motor and sensation is grossly normal.  Cranial nerves are grossly intact. PSYCH:  Alert and oriented to person, place and time. Affect is normal.  Labs/Imaging: HIDA Scan 02/15/18: FINDINGS: Prompt uptake and biliary excretion of activity by the liver is seen. Gallbladder activity is visualized, consistent with patency of cystic duct. Biliary activity passes into small bowel, consistent with patent common bile duct.  Calculated gallbladder ejection fraction is 22%%. (Normal gallbladder ejection fraction with Ensure is greater than 33%.)  Assessment and Plan: This is a 84 y.o. male who presents with abdominal pain and findings of biliary dyskinesia on HIDA scan.  I have independently viewed the patient's imaging study.  Discussed with the patient that currently the findings on HIDA scan show that his gallbladder does not squeeze effectively and could be the cause for his pain.  His pain seems to be tolerable at the time and he has not had any flare ups since his ED visit in 11/2017.  Discussed with the patient the role for surgery in laparoscopic vs open cholecystectomy.  The patient and his family noted that 7 years ago, the patient had neck surgery and while extubating him, he had an esophageal injury which caused dysphagia, and he needed 50+ days of rehab to be able to swallow again.  They are hesitant about surgery given this prior complication.  And after his neck surgery, he is unable to extend his neck which would make this a difficult airway to intubate for surgery.  The patient is unsure about what he wants to do.  It appears his pain is  tolerable and at this point, there is no urgency to having surgery done.  Discussed with patient that given his cholelithiasis, he is at risk of having cholecystitis.  Discussed adhering to a low fat diet to decrease the risk of biliary colic episodes.  The patient will follow up with [place] in a month, and in the meantime, he is going to assess his pain and decide what he wants to do.  We are happy to see him back whether he decides for surgery or for conservative management.  He is aware of symptoms to look out for and knows to come back or call us if any issues or concerns.  Patient and family are in agreement with this plan and all of their questions have been answered.  Face-to-face time spent with the patient and care providers was 25 minutes, with more than 50% of the time spent counseling, educating, and coordinating care of the patient.     Melvyn Neth, [place]

## 2018-02-18 ENCOUNTER — Other Ambulatory Visit: Payer: Self-pay

## 2018-02-18 ENCOUNTER — Inpatient Hospital Stay
Admission: EM | Admit: 2018-02-18 | Discharge: 2018-02-21 | DRG: 394 | Disposition: A | Discharge status: Home / Self Care | Payer: PPO | Attending: Internal Medicine | Admitting: Internal Medicine

## 2018-02-18 ENCOUNTER — Emergency Department: Payer: PPO

## 2018-02-18 DIAGNOSIS — I5033 Acute on chronic diastolic (congestive) heart failure: Secondary | ICD-10-CM | POA: Diagnosis present

## 2018-02-18 DIAGNOSIS — Z7951 Long term (current) use of inhaled steroids: Secondary | ICD-10-CM

## 2018-02-18 DIAGNOSIS — F418 Other specified anxiety disorders: Secondary | ICD-10-CM | POA: Diagnosis not present

## 2018-02-18 DIAGNOSIS — Z9842 Cataract extraction status, left eye: Secondary | ICD-10-CM | POA: Diagnosis not present

## 2018-02-18 DIAGNOSIS — I10 Essential (primary) hypertension: Secondary | ICD-10-CM | POA: Diagnosis present

## 2018-02-18 DIAGNOSIS — Z885 Allergy status to narcotic agent status: Secondary | ICD-10-CM

## 2018-02-18 DIAGNOSIS — K529 Noninfective gastroenteritis and colitis, unspecified: Secondary | ICD-10-CM | POA: Diagnosis not present

## 2018-02-18 DIAGNOSIS — F419 Anxiety disorder, unspecified: Secondary | ICD-10-CM | POA: Diagnosis present

## 2018-02-18 DIAGNOSIS — Z86718 Personal history of other venous thrombosis and embolism: Secondary | ICD-10-CM

## 2018-02-18 DIAGNOSIS — Z961 Presence of intraocular lens: Secondary | ICD-10-CM | POA: Diagnosis present

## 2018-02-18 DIAGNOSIS — Z8249 Family history of ischemic heart disease and other diseases of the circulatory system: Secondary | ICD-10-CM

## 2018-02-18 DIAGNOSIS — K921 Melena: Secondary | ICD-10-CM

## 2018-02-18 DIAGNOSIS — Z823 Family history of stroke: Secondary | ICD-10-CM

## 2018-02-18 DIAGNOSIS — Z981 Arthrodesis status: Secondary | ICD-10-CM

## 2018-02-18 DIAGNOSIS — K5289 Other specified noninfective gastroenteritis and colitis: Secondary | ICD-10-CM | POA: Diagnosis not present

## 2018-02-18 DIAGNOSIS — I5032 Chronic diastolic (congestive) heart failure: Secondary | ICD-10-CM | POA: Diagnosis not present

## 2018-02-18 DIAGNOSIS — I110 Hypertensive heart disease with heart failure: Secondary | ICD-10-CM | POA: Diagnosis not present

## 2018-02-18 DIAGNOSIS — K8020 Calculus of gallbladder without cholecystitis without obstruction: Secondary | ICD-10-CM | POA: Diagnosis not present

## 2018-02-18 DIAGNOSIS — E782 Mixed hyperlipidemia: Secondary | ICD-10-CM | POA: Diagnosis not present

## 2018-02-18 DIAGNOSIS — R55 Syncope and collapse: Secondary | ICD-10-CM | POA: Diagnosis not present

## 2018-02-18 DIAGNOSIS — Z87891 Personal history of nicotine dependence: Secondary | ICD-10-CM

## 2018-02-18 DIAGNOSIS — K760 Fatty (change of) liver, not elsewhere classified: Secondary | ICD-10-CM | POA: Diagnosis not present

## 2018-02-18 DIAGNOSIS — Z9841 Cataract extraction status, right eye: Secondary | ICD-10-CM | POA: Diagnosis not present

## 2018-02-18 DIAGNOSIS — K625 Hemorrhage of anus and rectum: Secondary | ICD-10-CM | POA: Diagnosis present

## 2018-02-18 DIAGNOSIS — R1311 Dysphagia, oral phase: Secondary | ICD-10-CM | POA: Diagnosis not present

## 2018-02-18 DIAGNOSIS — R110 Nausea: Secondary | ICD-10-CM | POA: Diagnosis not present

## 2018-02-18 DIAGNOSIS — K633 Ulcer of intestine: Secondary | ICD-10-CM | POA: Diagnosis not present

## 2018-02-18 DIAGNOSIS — K922 Gastrointestinal hemorrhage, unspecified: Secondary | ICD-10-CM | POA: Diagnosis not present

## 2018-02-18 DIAGNOSIS — M1990 Unspecified osteoarthritis, unspecified site: Secondary | ICD-10-CM | POA: Diagnosis not present

## 2018-02-18 DIAGNOSIS — K559 Vascular disorder of intestine, unspecified: Secondary | ICD-10-CM

## 2018-02-18 DIAGNOSIS — Z87442 Personal history of urinary calculi: Secondary | ICD-10-CM

## 2018-02-18 DIAGNOSIS — Z7901 Long term (current) use of anticoagulants: Secondary | ICD-10-CM

## 2018-02-18 DIAGNOSIS — E785 Hyperlipidemia, unspecified: Secondary | ICD-10-CM | POA: Diagnosis present

## 2018-02-18 DIAGNOSIS — I480 Paroxysmal atrial fibrillation: Secondary | ICD-10-CM | POA: Diagnosis present

## 2018-02-18 DIAGNOSIS — R1031 Right lower quadrant pain: Secondary | ICD-10-CM | POA: Diagnosis not present

## 2018-02-18 DIAGNOSIS — Z85828 Personal history of other malignant neoplasm of skin: Secondary | ICD-10-CM

## 2018-02-18 DIAGNOSIS — R0602 Shortness of breath: Secondary | ICD-10-CM | POA: Diagnosis not present

## 2018-02-18 LAB — COMPREHENSIVE METABOLIC PANEL
ALT: 44 U/L (ref 17–63)
ANION GAP: 7 (ref 5–15)
AST: 46 U/L — AB (ref 15–41)
Albumin: 4.2 g/dL (ref 3.5–5.0)
Alkaline Phosphatase: 45 U/L (ref 38–126)
BUN: 24 mg/dL — AB (ref 6–20)
CHLORIDE: 108 mmol/L (ref 101–111)
CO2: 27 mmol/L (ref 22–32)
Calcium: 8.9 mg/dL (ref 8.9–10.3)
Creatinine, Ser: 1.28 mg/dL — ABNORMAL HIGH (ref 0.61–1.24)
GFR, EST AFRICAN AMERICAN: 58 mL/min — AB (ref >60)
GFR, EST NON AFRICAN AMERICAN: 50 mL/min — AB (ref >60)
Glucose, Bld: 108 mg/dL — ABNORMAL HIGH (ref 65–99)
POTASSIUM: 4.1 mmol/L (ref 3.5–5.1)
Sodium: 142 mmol/L (ref 135–145)
TOTAL PROTEIN: 7.1 g/dL (ref 6.5–8.1)
Total Bilirubin: 0.8 mg/dL (ref 0.3–1.2)

## 2018-02-18 LAB — CBC
HEMATOCRIT: 42.3 % (ref 40.0–52.0)
Hemoglobin: 14.2 g/dL (ref 13.0–18.0)
MCH: 32.7 pg (ref 26.0–34.0)
MCHC: 33.5 g/dL (ref 32.0–36.0)
MCV: 97.8 fL (ref 80.0–100.0)
PLATELETS: 192 10*3/uL (ref 150–440)
RBC: 4.33 MIL/uL — AB (ref 4.40–5.90)
RDW: 13.4 % (ref 11.5–14.5)
WBC: 8.4 10*3/uL (ref 3.8–10.6)

## 2018-02-18 LAB — LIPASE, BLOOD: Lipase: 45 U/L (ref 11–51)

## 2018-02-18 LAB — APTT: aPTT: 31 seconds (ref 24–36)

## 2018-02-18 LAB — PROTIME-INR
INR: 1.13
PROTHROMBIN TIME: 14.4 s (ref 11.4–15.2)

## 2018-02-18 LAB — HEMOGLOBIN: HEMOGLOBIN: 13.9 g/dL (ref 13.0–18.0)

## 2018-02-18 MED ORDER — SODIUM CHLORIDE 0.9 % IV SOLN
INTRAVENOUS | Status: AC
  Administered 2018-02-18: via INTRAVENOUS

## 2018-02-18 MED ORDER — METOPROLOL TARTRATE 25 MG PO TABS
25.0000 mg | ORAL_TABLET | Freq: Two times a day (BID) | ORAL | Status: DC
  Administered 2018-02-19 – 2018-02-21 (×4): 25 mg via ORAL
  Filled 2018-02-18 (×5): qty 1

## 2018-02-18 MED ORDER — FENTANYL CITRATE (PF) 100 MCG/2ML IJ SOLN
50.0000 ug | Freq: Once | INTRAMUSCULAR | Status: AC
  Administered 2018-02-18: 50 ug via INTRAVENOUS
  Filled 2018-02-18: qty 2

## 2018-02-18 MED ORDER — AMIODARONE HCL 200 MG PO TABS
200.0000 mg | ORAL_TABLET | Freq: Every day | ORAL | Status: DC
  Administered 2018-02-19 – 2018-02-21 (×3): 200 mg via ORAL
  Filled 2018-02-18 (×3): qty 1

## 2018-02-18 MED ORDER — PRAVASTATIN SODIUM 20 MG PO TABS
80.0000 mg | ORAL_TABLET | Freq: Every day | ORAL | Status: DC
  Administered 2018-02-19: 80 mg via ORAL
  Filled 2018-02-18 (×2): qty 4

## 2018-02-18 MED ORDER — SODIUM CHLORIDE 0.9 % IV SOLN
8.0000 mg/h | INTRAVENOUS | Status: DC
  Administered 2018-02-19 – 2018-02-21 (×6): 8 mg/h via INTRAVENOUS
  Filled 2018-02-18 (×6): qty 80

## 2018-02-18 MED ORDER — PANTOPRAZOLE SODIUM 40 MG IV SOLR: 40.0000 mg | Freq: Two times a day (BID) | INTRAVENOUS | Status: DC

## 2018-02-18 MED ORDER — ONDANSETRON HCL 4 MG PO TABS
4.0000 mg | ORAL_TABLET | Freq: Four times a day (QID) | ORAL | Status: DC | PRN
  Administered 2018-02-21: 4 mg via ORAL
  Filled 2018-02-18: qty 1

## 2018-02-18 MED ORDER — ONDANSETRON HCL 4 MG/2ML IJ SOLN
4.0000 mg | Freq: Once | INTRAMUSCULAR | Status: AC
  Administered 2018-02-18: 4 mg via INTRAVENOUS
  Filled 2018-02-18: qty 2

## 2018-02-18 MED ORDER — ONDANSETRON HCL 4 MG/2ML IJ SOLN
4.0000 mg | Freq: Four times a day (QID) | INTRAMUSCULAR | Status: DC | PRN
  Administered 2018-02-20: 4 mg via INTRAVENOUS
  Filled 2018-02-18: qty 2

## 2018-02-18 MED ORDER — ACETAMINOPHEN 650 MG RE SUPP: 650.0000 mg | Freq: Four times a day (QID) | RECTAL | Status: DC | PRN

## 2018-02-18 MED ORDER — IOPAMIDOL (ISOVUE-300) INJECTION 61%
100.0000 mL | Freq: Once | INTRAVENOUS | Status: AC | PRN
  Administered 2018-02-18: 100 mL via INTRAVENOUS

## 2018-02-18 MED ORDER — SODIUM CHLORIDE 0.9 % IV SOLN
80.0000 mg | Freq: Once | INTRAVENOUS | Status: AC
  Administered 2018-02-18: 80 mg via INTRAVENOUS
  Filled 2018-02-18: qty 80

## 2018-02-18 MED ORDER — ACETAMINOPHEN 325 MG PO TABS: 650.0000 mg | ORAL_TABLET | Freq: Four times a day (QID) | ORAL | Status: DC | PRN

## 2018-02-18 NOTE — ED Notes (Signed)
Pt back from CT

## 2018-02-18 NOTE — ED Provider Notes (Addendum)
[facility] Emergency Department Provider Note  ____________________________________________  Time seen: Approximately 5:15 PM  I have reviewed the triage vital signs and the nursing notes.   HISTORY  Chief Complaint GI Bleeding   HPI Eric [name] is a 84 y.o. male with a history of atrial fibrillation and DVT on Eliquis, CHF who presents for evaluation of abdominal pain and rectal bleeding. Patient reports that he was at [facility] with his wife when he had a bowel movement and noted blood in the toilet paper. He did not look in the toil;et to see if there was blood in it. He felt very nauseous and felt he was going to vomit but never did. He also felt dizzy like he was going to pass out but never lost consciousness. He started to have abdominal pain that he describes as sharp, located in the right lower quadrant, radiating to his back, intermittent, currently 8 out of 10, worse with palpation of his abdomen. He was then walking around [employer] when he had a second bowel movement. This time he reports having no stool but just a gush of bright red blood per rectum. Patient denies feeling dizzy at this time. Denies any prior episodes of rectal bleeding or GI bleeding. No chest pain or shortness of breath.  Past Medical History:  Diagnosis Date  . Anxiety   . Arthritis   . Atrial fibrillation ([place]) 05/28/2007    Qualifier: Diagnosis of  By: Council Mechanic MD, Hilaria Ota    . Atypical chest pain    a. 05/2017 MV: no ischemia, EF 79%.  . Chest pain 10/20/2017  . Chronic diastolic CHF (congestive heart failure) ([place])    a. 01/2011 Echo: EF 50-55%, gr1 DD, mild AI, nl RV fxn, mild TR/PR; b. 10/2017 Echo: EF 60-65%, mild LVH, gr2 DD.  Marland Kitchen DDD (degenerative disc disease), cervical   . Depressive disorder, not elsewhere classified   . Difficult intubation   . Dysphagia, oral phase   . Dyspnea   . Edema   . History of DVT (deep vein thrombosis)   . History of kidney stones   .  Hypertension   . HYPERTENSION, BENIGN ESSENTIAL 05/28/2007   Qualifier: Diagnosis of  By: Council Mechanic MD, Hilaria Ota   . Hypoxemia   . Impacted cerumen   . Long term current use of anticoagulant 03/02/2011  . LOW BACK PAIN SYNDROME 03/17/2009   Qualifier: Diagnosis of  By: Council Mechanic MD, Hilaria Ota   . Mixed hyperlipidemia   . 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 Other myelopathy   . Pain in limb   . Palpitations   . Paroxysmal Atrial Fibrillation ([place])    a. a. 01/2011 in setting of post-op complications including aspiration pna;  b. CHA2DS2VASc = 4--> Amio/Eliquis.  . Pneumonia 03/06/2003  . Spinal stenosis, unspecified region other than cervical   . 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 Syncope    a. 10/2017-->Event monitor: RSR, rare PACs/PVCs.  . Syncope and collapse 10/20/2017  . Thoracic or lumbosacral neuritis or radiculitis, unspecified     Patient Active Problem List   Diagnosis Date Noted  . Calculus of gallbladder without cholecystitis without obstruction 02/01/2018  . Right upper quadrant abdominal pain 02/01/2018  . Chest pain 10/20/2017  . Syncope and collapse 10/20/2017  . S/P ORIF (open reduction internal fixation) fracture 03/07/2017  . Lower extremity edema 03/07/2017  .  Chronic diastolic CHF (congestive heart failure) ([place]) 01/19/2015  . Hyperlipidemia, mixed 06/28/2013  . Cervical myelopathy with cervical radiculopathy 06/28/2013  . Cataract of both eyes 06/28/2013  . Anemia of chronic disease 04/14/2013  . Spondylosis, cervical, with myelopathy   . Spinal stenosis, lumbar region, with neurogenic claudication   . DDD (degenerative disc disease), cervical   . PAF (paroxysmal atrial fibrillation) ([place])   . Hypertension   . Dizziness 11/09/2011  . Edema 05/18/2011  . Long term current use of anticoagulant 03/02/2011  . VITAMIN D DEFICIENCY 11/29/2010  . ANXIETY STATE, UNSPECIFIED  07/16/2009  . LOW BACK PAIN SYNDROME 03/17/2009  . NONTRAUMATIC RUPTURE OF TENDONS OF BICEPS 12/01/2008  . ERECTILE DYSFUNCTION, ORGANIC 12/10/2007  . Hyperlipidemia 05/28/2007  . SEXUAL DYSFUNCTION 05/28/2007  . HYPERTENSION, BENIGN ESSENTIAL 05/28/2007  . Atrial fibrillation ([place]) 05/28/2007  . FATTY LIVER DISEASE- ABD. U/S 11/01, MILD ELEVATED LFT'S 05/28/2007    Past Surgical History:  Procedure Laterality Date  . CARDIOVERSION N/A 06/20/2017   Procedure: CARDIOVERSION;  Surgeon: [name], MD;  Location: [facility] ORS;  Service: Cardiovascular;  Laterality: N/A;  . CATARACT EXTRACTION W/ INTRAOCULAR LENS  IMPLANT, BILATERAL    . CERVICAL FUSION  02/10/2011  . history of abd ultrasound  11/01   fatty liver  . MULTIPLE TOOTH EXTRACTIONS    . ORIF FIBULA FRACTURE Left 01/06/2017   Procedure: OPEN REDUCTION INTERNAL FIXATION (ORIF) FIBULA FRACTURE DISTAL FIBULA;  Surgeon: Melrose Nakayama, MD;  Location: [place];  Service: Orthopedics;  Laterality: Left;  Patient states has problems if he will have a tube in throat for Genera; Anesthesia    Prior to Admission medications   Medication Sig Start Date End Date Taking? Authorizing Provider  amiodarone (PACERONE) 200 MG tablet Take 1 tablet (200 mg total) by mouth daily. 12/11/17   [name], MD  apixaban (ELIQUIS) 5 MG TABS tablet Take 1 tablet (5 mg total) by mouth 2 (two) times daily. 04/24/17   [name], MD  cetirizine (ZYRTEC) 10 MG tablet Take 1 tablet (10 mg total) by mouth daily. 11/01/17   Lauree Chandler, NP  Cholecalciferol (VITAMIN D) 1000 UNITS capsule Take one tablet once daily 08/09/13   Reed, [name] L, DO  fluticasone (FLONASE) 50 MCG/ACT nasal spray Place 2 sprays daily into both nostrils. 10/27/17 10/27/18  Lauree Chandler, NP  furosemide (LASIX) 40 MG tablet Take 40 mg 2 (two) times daily by mouth.    [provider]  gabapentin (NEURONTIN) 300 MG capsule Take 1 capsule (300 mg total) by mouth  2 (two) times daily. 01/11/18   Reed, [name] L, DO  metoprolol tartrate (LOPRESSOR) 25 MG tablet Take 1 tablet (25 mg total) 2 (two) times daily by mouth. 10/21/17 10/21/18  Georgette Shell, MD  pantoprazole (PROTONIX) 40 MG tablet Take 1 tablet (40 mg total) by mouth at bedtime. 11/01/17   Lauree Chandler, NP  potassium chloride SA (K-DUR,KLOR-CON) 20 MEQ tablet Take 0.5 tablets (10 mEq total) 2 (two) times daily by mouth. 10/21/17   Georgette Shell, MD  pravastatin (PRAVACHOL) 80 MG tablet Take 1 tablet (80 mg total) daily by mouth. 10/23/17   Reed, [name] L, DO  traMADol (ULTRAM) 50 MG tablet Take 50 mg by mouth at bedtime as needed for pain.    [provider]    Allergies Morphine and related; Percocet [oxycodone-acetaminophen]; and Valium  Family History  Problem Relation Age of Onset  . Stroke Father   .  Atrial fibrillation Brother        on coumadin  . Heart disease Brother        AFib- coumadin  . Stroke Paternal Grandfather   . Cancer Other        colon cancer at early age  . Prostate cancer Neg Hx   . Kidney cancer Neg Hx   . Bladder Cancer Neg Hx     Social History Social History   Tobacco Use  . Smoking status: Former Smoker    Years: 1.00  . Smokeless tobacco: Never Used  . Tobacco comment: stopped in 20's  Substance Use Topics  . Alcohol use: No  . Drug use: No    Review of Systems  Constitutional: Negative for fever. + near syncope Eyes: Negative for visual changes. ENT: Negative for sore throat. Neck: No neck pain  Cardiovascular: Negative for chest pain. Respiratory: Negative for shortness of breath. Gastrointestinal: + RLQ abdominal pain, nausea, rectal bleeding. No vomiting or diarrhea. Genitourinary: Negative for dysuria. Musculoskeletal: Negative for back pain. Skin: Negative for rash. Neurological: Negative for headaches, weakness or numbness. Psych: No SI or  HI  ____________________________________________   PHYSICAL EXAM:  VITAL SIGNS: ED Triage Vitals  Enc Vitals Group     BP 02/18/18 1554 (!) 118/52     Pulse Rate 02/18/18 1554 (!) 55     Resp 02/18/18 1554 18     Temp 02/18/18 1554 98.3 F (36.8 C)     Temp Source 02/18/18 1554 Oral     SpO2 02/18/18 1554 94 %     Weight 02/18/18 1555 180 lb (81.6 kg)     Height 02/18/18 1555 5\' 7"  (1.702 m)     Head Circumference --      Peak Flow --      Pain Score 02/18/18 1555 6     Pain Loc --      Pain Edu? --      Excl. in [place]? --     Constitutional: Alert and oriented. Well appearing and in no apparent distress. HEENT:      Head: Normocephalic and atraumatic.         Eyes: Conjunctivae are normal. Sclera is non-icteric.       Mouth/Throat: Mucous membranes are moist.       Neck: Supple with no signs of meningismus. Cardiovascular: Regular rate and rhythm. No murmurs, gallops, or rubs. 2+ symmetrical distal pulses are present in all extremities. No JVD. Respiratory: Normal respiratory effort. Lungs are clear to auscultation bilaterally. No wheezes, crackles, or rhonchi.  Gastrointestinal: Soft, ttp over the RLQ, no tenderness to palpation on the right upper quadrant, no Murphy's sign,  non distended with positive bowel sounds. No rebound or guarding. Genitourinary: No CVA tenderness. Rectal exam showing small amount of blood, no stool, no hemorrhoids or anal fissure Musculoskeletal: Nontender with normal range of motion in all extremities. No edema, cyanosis, or erythema of extremities. Neurologic: Normal speech and language. Face is symmetric. Moving all extremities. No gross focal neurologic deficits are appreciated. Skin: Skin is warm, dry and intact. No rash noted. Psychiatric: Mood and affect are normal. Speech and behavior are normal.  ____________________________________________   LABS (all labs ordered are listed, but only abnormal results are displayed)  Labs Reviewed   COMPREHENSIVE METABOLIC PANEL - Abnormal; Notable for the following components:      Result Value   Glucose, Bld 108 (*)    BUN 24 (*)    Creatinine, Ser 1.28 (*)  AST 46 (*)    GFR calc non Af Amer 50 (*)    GFR calc Af Amer 58 (*)    All other components within normal limits  CBC - Abnormal; Notable for the following components:   RBC 4.33 (*)    All other components within normal limits  LIPASE, BLOOD  PROTIME-INR  APTT  TYPE AND SCREEN   ____________________________________________  EKG  none  ____________________________________________  RADIOLOGY  I have personally reviewed the images performed during this visit and I agree with the Radiologist's read.   Interpretation by Radiologist:  Ct Abdomen Pelvis W Contrast  Result Date: 02/18/2018 CLINICAL DATA:  Acute onset rectal bleeding EXAM: CT ABDOMEN AND PELVIS WITH CONTRAST TECHNIQUE: Multidetector CT imaging of the abdomen and pelvis was performed using the standard protocol following bolus administration of intravenous contrast. CONTRAST:  151mL ISOVUE-300 IOPAMIDOL (ISOVUE-300) INJECTION 61% COMPARISON:  11/12/2017 FINDINGS: Lower chest: Calcified left hilar lymph nodes are noted consistent with prior granulomatous disease. Hepatobiliary: Multiple gallstones are noted within the gallbladder without complicating factors. The liver shows a tiny hypodensity in the posterior aspect of the right lobe near the dome likely representing a small cyst. This is stable from the prior exam. Pancreas: Unremarkable. No pancreatic ductal dilatation or surrounding inflammatory changes. Spleen: Multiple calcified granulomas are noted. No mass lesion is seen. Adrenals/Urinary Tract: Adrenal glands are within normal limits. Kidneys demonstrate cystic lesion is on the right. No obstructive changes are seen. No renal calculi are noted. The bladder is partially distended. Stomach/Bowel: Mild diverticular changes noted without evidence of  diverticulitis. The colon is predominately decompressed although some areas of slight increased attenuation are noted in the rectosigmoid junction. This Seyler be related to prior hemorrhage consistent with the given clinical history. No definitive mass is seen. The appendix is within normal limits. No obstructive or inflammatory changes of the bowel are seen. Vascular/Lymphatic: Aortic atherosclerosis. No enlarged abdominal or pelvic lymph nodes. Reproductive: Prostate is unremarkable. Other: No abdominal wall hernia or abnormality. No abdominopelvic ascites. Musculoskeletal: Degenerative changes of the lumbar spine are noted. IMPRESSION: Slight increased attenuation is noted within the lumen of the colon which Showers be related to hemorrhage given the clinical history. No areas of active extravasation are noted at this time. Cholelithiasis without complicating factors. Chronic changes as described above stable from the previous exam. Electronically Signed   By: Inez Catalina M.D.   On: 02/18/2018 17:51      ____________________________________________   PROCEDURES  Procedure(s) performed: None Procedures Critical Care performed:  None ____________________________________________   INITIAL IMPRESSION / ASSESSMENT AND PLAN / ED COURSE  84 y.o. male with a history of atrial fibrillation and DVT on Eliquis, CHF who presents for evaluation of abdominal pain and rectal bleeding. Patient is well-appearing, hemodynamically stable, no distress, abdomen is soft with tenderness to palpation on the right lower quadrant, rectal exam showing small amount of blood in the rectal vault with no evidence of hemorrhoids or fissure. Patient is on Eliquis. Labs show stable hemoglobin at 14.2. Differential diagnosis for the abdominal pain includes diverticulitis, appendicitis, kidney stone, cystitis, gallbladder disease. Patient does have previous imaging on Epic that shows patient has a history of diverticulosis and also  history of chronic cholecystitis for which is currently being evaluated by Dr. Hampton Abbot for outpatient surgery. For the rectal bleeding and concern for diverticular bleed at this time. Will send coags and type and screen. Since patient is anticoagulated he will need admission for monitoring and trending H&H.  _________________________ 8:36 PM on 02/18/2018 -----------------------------------------  Patient remains hemodynamically stable. CT concerning for focus of hemorrhage in the colon. Patient has had 2 further episodes of bloody stool here in the ED. Type and screen active. We'll admit to the hospitalist service.   As part of my medical decision making, I reviewed the following data within the [place] History obtained from family, Nursing notes reviewed and incorporated, Labs reviewed , Radiograph reviewed , Discussed with admitting physician , Notes from prior ED visits and [place] Controlled Substance Database    Pertinent labs & imaging results that were available during my care of the patient were reviewed by me and considered in my medical decision making (see chart for details).    ____________________________________________   FINAL CLINICAL IMPRESSION(S) / ED DIAGNOSES  Final diagnoses:  Lower GI bleed      NEW MEDICATIONS STARTED DURING THIS VISIT:  ED Discharge Orders    None       Note:  This document was prepared using Dragon voice recognition software and Diez include unintentional dictation errors.    Rudene Re, MD 02/18/18 0379    Rudene Re, MD 02/18/18 2039

## 2018-02-18 NOTE — ED Notes (Signed)
Pt had been up to toilet during shift change. Pt noted to have had moderate amount of brown stool that had moderate amount of red blood around the stool. No clots seen.

## 2018-02-18 NOTE — H&P (Signed)
[facility] [place] NAME: Eric [name]    MR#:  [id number]  DATE OF BIRTH:  06-02-1932  DATE OF ADMISSION:  02/18/2018  PRIMARY CARE PHYSICIAN: [name], DO   REQUESTING/REFERRING PHYSICIAN: Alfred Levins, MD  CHIEF COMPLAINT:   Chief Complaint  Patient presents with  . GI Bleeding    HISTORY OF PRESENT ILLNESS:  Eric [name]  is a 84 y.o. male who presents with bright red blood per rectum.  Patient states that this started earlier today.  He had a bowel movement when he was at a restaurant with his family around lunchtime.  Afterwards he was shopping and had a subsequent bowel movement with some blood on the tissue when he wiped.  He says he thinks he probably had bleeding at that time but he did not look at the stool.  Later on he had another bowel movement which was very loose and straight bright red blood.  He has had 2-3 more bowel movements here in the ED.  Hemoglobin is initially stable.  Patient has no prior history of GI bleed.  He is on Eliquis for A. fib.  CT scan showed some blood in his colon, but did not identify any particular bleeding source.  He denies any significant abdominal symptoms like nausea or vomiting or abdominal pain.  Hospitalist called for admission  PAST MEDICAL HISTORY:   Past Medical History:  Diagnosis Date  . Anxiety   . Arthritis   . Atrial fibrillation ([place]) 05/28/2007    Qualifier: Diagnosis of  By: Council Mechanic MD, Hilaria Ota    . Atypical chest pain    a. 05/2017 MV: no ischemia, EF 79%.  . Chest pain 10/20/2017  . Chronic diastolic CHF (congestive heart failure) ([place])    a. 01/2011 Echo: EF 50-55%, gr1 DD, mild AI, nl RV fxn, mild TR/PR; b. 10/2017 Echo: EF 60-65%, mild LVH, gr2 DD.  Marland Kitchen DDD (degenerative disc disease), cervical   . Depressive disorder, not elsewhere classified   . Difficult intubation   . Dysphagia, oral phase   . Dyspnea   . Edema   . History of DVT (deep vein thrombosis)   .  History of kidney stones   . Hypertension   . HYPERTENSION, BENIGN ESSENTIAL 05/28/2007   Qualifier: Diagnosis of  By: Council Mechanic MD, Hilaria Ota   . Hypoxemia   . Impacted cerumen   . Long term current use of anticoagulant 03/02/2011  . LOW BACK PAIN SYNDROME 03/17/2009   Qualifier: Diagnosis of  By: Council Mechanic MD, Hilaria Ota   . Mixed hyperlipidemia   . 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 Other myelopathy   . Pain in limb   . Palpitations   . Paroxysmal Atrial Fibrillation ([place])    a. a. 01/2011 in setting of post-op complications including aspiration pna;  b. CHA2DS2VASc = 4--> Amio/Eliquis.  . Pneumonia 03/06/2003  . Spinal stenosis, unspecified region other than cervical   . 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 Syncope    a. 10/2017-->Event monitor: RSR, rare PACs/PVCs.  . Syncope and collapse 10/20/2017  . Thoracic or lumbosacral neuritis or radiculitis, unspecified      PAST SURGICAL HISTORY:   Past Surgical History:  Procedure Laterality Date  . CARDIOVERSION N/A 06/20/2017   Procedure: CARDIOVERSION;  Surgeon: [name], MD;  Location: [facility] ORS;  Service:  Cardiovascular;  Laterality: N/A;  . CATARACT EXTRACTION W/ INTRAOCULAR LENS  IMPLANT, BILATERAL    . CERVICAL FUSION  02/10/2011  . history of abd ultrasound  11/01   fatty liver  . MULTIPLE TOOTH EXTRACTIONS    . ORIF FIBULA FRACTURE Left 01/06/2017   Procedure: OPEN REDUCTION INTERNAL FIXATION (ORIF) FIBULA FRACTURE DISTAL FIBULA;  Surgeon: Melrose Nakayama, MD;  Location: [facility];  Service: Orthopedics;  Laterality: Left;  Patient states has problems if he will have a tube in throat for Genera; Anesthesia     SOCIAL HISTORY:   Social History   Tobacco Use  . Smoking status: Former Smoker    Years: 1.00  . Smokeless tobacco: Never Used  . Tobacco comment: stopped in 20's  Substance Use Topics  . Alcohol use: No     FAMILY HISTORY:    Family History  Problem Relation Age of Onset  . Stroke Father   . Atrial fibrillation Brother        on coumadin  . Heart disease Brother        AFib- coumadin  . Stroke Paternal Grandfather   . Cancer Other        colon cancer at early age  . Prostate cancer Neg Hx   . Kidney cancer Neg Hx   . Bladder Cancer Neg Hx      DRUG ALLERGIES:   Allergies  Allergen Reactions  . Morphine And Related Shortness Of Breath  . Percocet [Oxycodone-Acetaminophen] Shortness Of Breath  . Valium Shortness Of Breath    MEDICATIONS AT HOME:   Prior to Admission medications   Medication Sig Start Date End Date Taking? Authorizing Provider  amiodarone (PACERONE) 200 MG tablet Take 1 tablet (200 mg total) by mouth daily. 12/11/17   [name], MD  apixaban (ELIQUIS) 5 MG TABS tablet Take 1 tablet (5 mg total) by mouth 2 (two) times daily. 04/24/17   [name], MD  cetirizine (ZYRTEC) 10 MG tablet Take 1 tablet (10 mg total) by mouth daily. 11/01/17   Lauree Chandler, NP  Cholecalciferol (VITAMIN D) 1000 UNITS capsule Take one tablet once daily 08/09/13   Reed, [name] L, DO  fluticasone (FLONASE) 50 MCG/ACT nasal spray Place 2 sprays daily into both nostrils. 10/27/17 10/27/18  Lauree Chandler, NP  furosemide (LASIX) 40 MG tablet Take 40 mg 2 (two) times daily by mouth.    [provider]  gabapentin (NEURONTIN) 300 MG capsule Take 1 capsule (300 mg total) by mouth 2 (two) times daily. 01/11/18   Reed, [name] L, DO  metoprolol tartrate (LOPRESSOR) 25 MG tablet Take 1 tablet (25 mg total) 2 (two) times daily by mouth. 10/21/17 10/21/18  Georgette Shell, MD  pantoprazole (PROTONIX) 40 MG tablet Take 1 tablet (40 mg total) by mouth at bedtime. 11/01/17   Lauree Chandler, NP  potassium chloride SA (K-DUR,KLOR-CON) 20 MEQ tablet Take 0.5 tablets (10 mEq total) 2 (two) times daily by mouth. 10/21/17   Georgette Shell, MD  pravastatin (PRAVACHOL) 80 MG tablet  Take 1 tablet (80 mg total) daily by mouth. 10/23/17   Reed, [name] L, DO  traMADol (ULTRAM) 50 MG tablet Take 50 mg by mouth at bedtime as needed for pain.    [provider]    REVIEW OF SYSTEMS:  Review of Systems  Constitutional: Negative for chills, fever, malaise/fatigue and weight loss.  HENT: Negative for ear pain, hearing loss and tinnitus.   Eyes: Negative for  blurred vision, double vision, pain and redness.  Respiratory: Negative for cough, hemoptysis and shortness of breath.   Cardiovascular: Negative for chest pain, palpitations, orthopnea and leg swelling.  Gastrointestinal: Positive for blood in stool. Negative for abdominal pain, constipation, diarrhea, nausea and vomiting.  Genitourinary: Negative for dysuria, frequency and hematuria.  Musculoskeletal: Negative for back pain, joint pain and neck pain.  Skin:       No acne, rash, or lesions  Neurological: Negative for dizziness, tremors, focal weakness and weakness.  Endo/Heme/Allergies: Negative for polydipsia. Does not bruise/bleed easily.  Psychiatric/Behavioral: Negative for depression. The patient is not nervous/anxious and does not have insomnia.      VITAL SIGNS:   Vitals:   02/18/18 1555 02/18/18 1930 02/18/18 1945 02/18/18 2000  BP:  (!) 151/78  123/70  Pulse:  61 (!) 58 63  Resp:  18    Temp:      TempSrc:      SpO2:  96% 97% (!) 87%  Weight: 81.6 kg (180 lb)     Height: 5\' 7"  (1.702 m)      Wt Readings from Last 3 Encounters:  02/18/18 81.6 kg (180 lb)  02/16/18 81.6 kg (180 lb)  02/01/18 82.6 kg (182 lb)    PHYSICAL EXAMINATION:  Physical Exam  Vitals reviewed. Constitutional: He is oriented to person, place, and time. He appears well-developed and well-nourished. No distress.  HENT:  Head: Normocephalic and atraumatic.  Mouth/Throat: Oropharynx is clear and moist.  Eyes: Conjunctivae and EOM are normal. Pupils are equal, round, and reactive to light. No scleral icterus.  Neck:  Normal range of motion. Neck supple. No JVD present. No thyromegaly present.  Cardiovascular: Normal rate, regular rhythm and intact distal pulses. Exam reveals no gallop and no friction rub.  No murmur heard. Respiratory: Effort normal and breath sounds normal. No respiratory distress. He has no wheezes. He has no rales.  GI: Soft. Bowel sounds are normal. He exhibits no distension. There is no tenderness.  Musculoskeletal: Normal range of motion. He exhibits no edema.  No arthritis, no gout  Lymphadenopathy:    He has no cervical adenopathy.  Neurological: He is alert and oriented to person, place, and time. No cranial nerve deficit.  No dysarthria, no aphasia  Skin: Skin is warm and dry. No rash noted. No erythema.  Psychiatric: He has a normal mood and affect. His behavior is normal. Judgment and thought content normal.    LABORATORY PANEL:   CBC Recent Labs  Lab 02/18/18 1601  WBC 8.4  HGB 14.2  HCT 42.3  PLT 192   ------------------------------------------------------------------------------------------------------------------  Chemistries  Recent Labs  Lab 02/18/18 1601  NA 142  K 4.1  CL 108  CO2 27  GLUCOSE 108*  BUN 24*  CREATININE 1.28*  CALCIUM 8.9  AST 46*  ALT 44  ALKPHOS 45  BILITOT 0.8   ------------------------------------------------------------------------------------------------------------------  Cardiac Enzymes No results for input(s): TROPONINI in the last 168 hours. ------------------------------------------------------------------------------------------------------------------  RADIOLOGY:  Ct Abdomen Pelvis W Contrast  Result Date: 02/18/2018 CLINICAL DATA:  Acute onset rectal bleeding EXAM: CT ABDOMEN AND PELVIS WITH CONTRAST TECHNIQUE: Multidetector CT imaging of the abdomen and pelvis was performed using the standard protocol following bolus administration of intravenous contrast. CONTRAST:  130mL ISOVUE-300 IOPAMIDOL (ISOVUE-300)  INJECTION 61% COMPARISON:  11/12/2017 FINDINGS: Lower chest: Calcified left hilar lymph nodes are noted consistent with prior granulomatous disease. Hepatobiliary: Multiple gallstones are noted within the gallbladder without complicating factors. The liver shows a tiny  hypodensity in the posterior aspect of the right lobe near the dome likely representing a small cyst. This is stable from the prior exam. Pancreas: Unremarkable. No pancreatic ductal dilatation or surrounding inflammatory changes. Spleen: Multiple calcified granulomas are noted. No mass lesion is seen. Adrenals/Urinary Tract: Adrenal glands are within normal limits. Kidneys demonstrate cystic lesion is on the right. No obstructive changes are seen. No renal calculi are noted. The bladder is partially distended. Stomach/Bowel: Mild diverticular changes noted without evidence of diverticulitis. The colon is predominately decompressed although some areas of slight increased attenuation are noted in the rectosigmoid junction. This Rachel be related to prior hemorrhage consistent with the given clinical history. No definitive mass is seen. The appendix is within normal limits. No obstructive or inflammatory changes of the bowel are seen. Vascular/Lymphatic: Aortic atherosclerosis. No enlarged abdominal or pelvic lymph nodes. Reproductive: Prostate is unremarkable. Other: No abdominal wall hernia or abnormality. No abdominopelvic ascites. Musculoskeletal: Degenerative changes of the lumbar spine are noted. IMPRESSION: Slight increased attenuation is noted within the lumen of the colon which [place]ano be related to hemorrhage given the clinical history. No areas of active extravasation are noted at this time. Cholelithiasis without complicating factors. Chronic changes as described above stable from the previous exam. Electronically Signed   By: Inez Catalina M.D.   On: 02/18/2018 17:51    EKG:   Orders placed or performed in visit on 12/20/17  . EKG 12-Lead     IMPRESSION AND PLAN:  Principal Problem:   BRBPR (bright red blood per rectum) -unclear etiology, though given clinical history this seems very much like a lower GI bleed.  However, we will keep him on Protonix for tonight, trend his hemoglobin serially, and get a GI consult Active Problems:   PAF (paroxysmal atrial fibrillation) ([facility]) -continue rate controlling medications, hold Eliquis   Hypertension -continue home meds   Chronic diastolic CHF (congestive heart failure) ([place]) -continue home medications   Hyperlipidemia -home dose antilipid   Anxiety -home dose anxiolytics  Chart review performed and case discussed with ED provider. Labs, imaging and/or ECG reviewed by provider and discussed with patient/family. Management plans discussed with the patient and/or family.  DVT PROPHYLAXIS: Mechanical only  GI PROPHYLAXIS: PPI  ADMISSION STATUS: Inpatient  CODE STATUS: Full Code Status History    Date Active Date Inactive Code Status Order ID Comments User Context   10/20/2017 00:55 10/21/2017 21:27 Full Code [id number]  Norval Morton, MD ED      TOTAL TIME TAKING CARE OF THIS PATIENT: 45 minutes.   Sarath Privott [place] 02/18/2018, 9:08 PM  [employer]  [phone]  CC: Primary care physician; [name], DO  Note:  This document was prepared using Dragon voice recognition software and [place] include unintentional dictation errors.

## 2018-02-18 NOTE — ED Notes (Signed)
Patient transported to 226. 

## 2018-02-18 NOTE — ED Notes (Signed)
FIRST NURSE NOTE:  C/o rectal bleeding that started this afternoon associated with nausea.   Per family pt has gallbladder disorder as well.

## 2018-02-18 NOTE — ED Notes (Signed)
Family states Dr. Hampton Abbot needs to be notified the patient is in the ED.

## 2018-02-18 NOTE — ED Triage Notes (Signed)
Pt c/o bright red blood when he whips after having a BM. Denies passing any clots. Pt c/o having lower abd pain. States sx started today.

## 2018-02-19 ENCOUNTER — Other Ambulatory Visit: Payer: Self-pay

## 2018-02-19 ENCOUNTER — Telehealth: Payer: Self-pay | Admitting: Cardiovascular Disease

## 2018-02-19 DIAGNOSIS — K625 Hemorrhage of anus and rectum: Secondary | ICD-10-CM

## 2018-02-19 DIAGNOSIS — K922 Gastrointestinal hemorrhage, unspecified: Secondary | ICD-10-CM

## 2018-02-19 LAB — CBC
HCT: 39.9 % — ABNORMAL LOW (ref 40.0–52.0)
Hemoglobin: 13.4 g/dL (ref 13.0–18.0)
MCH: 32.4 pg (ref 26.0–34.0)
MCHC: 33.7 g/dL (ref 32.0–36.0)
MCV: 96.2 fL (ref 80.0–100.0)
PLATELETS: 161 10*3/uL (ref 150–440)
RBC: 4.15 MIL/uL — AB (ref 4.40–5.90)
RDW: 13.1 % (ref 11.5–14.5)
WBC: 6.5 10*3/uL (ref 3.8–10.6)

## 2018-02-19 LAB — HEMOGLOBIN
HEMOGLOBIN: 13.2 g/dL (ref 13.0–18.0)
HEMOGLOBIN: 13.6 g/dL (ref 13.0–18.0)
HEMOGLOBIN: 14.2 g/dL (ref 13.0–18.0)

## 2018-02-19 LAB — BASIC METABOLIC PANEL
ANION GAP: 9 (ref 5–15)
BUN: 20 mg/dL (ref 6–20)
CO2: 22 mmol/L (ref 22–32)
Calcium: 8.1 mg/dL — ABNORMAL LOW (ref 8.9–10.3)
Chloride: 109 mmol/L (ref 101–111)
Creatinine, Ser: 1.23 mg/dL (ref 0.61–1.24)
GFR, EST NON AFRICAN AMERICAN: 52 mL/min — AB (ref >60)
GLUCOSE: 86 mg/dL (ref 65–99)
POTASSIUM: 3.6 mmol/L (ref 3.5–5.1)
SODIUM: 140 mmol/L (ref 135–145)

## 2018-02-19 LAB — TYPE AND SCREEN
ABO/RH(D): A POS
ANTIBODY SCREEN: NEGATIVE

## 2018-02-19 MED ORDER — POLYETHYLENE GLYCOL 3350 17 GM/SCOOP PO POWD
1.0000 | Freq: Once | ORAL | Status: AC
  Administered 2018-02-19: 255 g via ORAL
  Filled 2018-02-19: qty 255

## 2018-02-19 NOTE — Progress Notes (Signed)
[name] [place] NAME: Eric [name]    MR#:  [id number]  DATE OF BIRTH:  1932-03-15  SUBJECTIVE:   Patient here with BRBPR  REVIEW OF SYSTEMS:    Review of Systems  Constitutional: Negative for fever, chills weight loss HENT: Negative for ear pain, nosebleeds, congestion, facial swelling, rhinorrhea, neck pain, neck stiffness and ear discharge.   Respiratory: Negative for cough, shortness of breath, wheezing  Cardiovascular: Negative for chest pain, palpitations and leg swelling.  Gastrointestinal: positive for bright red blood per rectum. No abdominal pain, nausea or vomiting. No melena. Genitourinary: Negative for dysuria, urgency, frequency, hematuria Musculoskeletal: Negative for back pain or joint pain Neurological: Negative for dizziness, seizures, syncope, focal weakness,  numbness and headaches.  Hematological: Does not bruise/bleed easily.  Psychiatric/Behavioral: Negative for hallucinations, confusion, dysphoric mood    Tolerating Diet: clears      DRUG ALLERGIES:   Allergies  Allergen Reactions  . Morphine And Related Shortness Of Breath  . Percocet [Oxycodone-Acetaminophen] Shortness Of Breath  . Valium Shortness Of Breath    VITALS:  Blood pressure 116/64, pulse 65, temperature 98.1 F (36.7 C), temperature source Oral, resp. rate 20, height 5\' 7"  (1.702 m), weight 80.2 kg (176 lb 12.9 oz), SpO2 96 %.  PHYSICAL EXAMINATION:  Constitutional: Appears well-developed and well-nourished. No distress. HENT: Normocephalic. Marland Kitchen Oropharynx is clear and moist.  Eyes: Conjunctivae and EOM are normal. PERRLA, no scleral icterus.  Neck: Normal ROM. Neck supple. No JVD. No tracheal deviation. CVS: RRR, S1/S2 +, no murmurs, no gallops, no carotid bruit.  Pulmonary: Effort and breath sounds normal, no stridor, rhonchi, wheezes, rales.  Abdominal: Soft. BS +,  no distension, tenderness, rebound or guarding.  Musculoskeletal:  Normal range of motion. No edema and no tenderness.  Neuro: Alert. CN 2-12 grossly intact. No focal deficits. Skin: Skin is warm and dry. No rash noted. Psychiatric: Normal mood and affect.      LABORATORY PANEL:   CBC Recent Labs  Lab 02/19/18 0415 02/19/18 0947  WBC 6.5  --   HGB 13.4 13.2  HCT 39.9*  --   PLT 161  --    ------------------------------------------------------------------------------------------------------------------  Chemistries  Recent Labs  Lab 02/18/18 1601 02/19/18 0415  NA 142 140  K 4.1 3.6  CL 108 109  CO2 27 22  GLUCOSE 108* 86  BUN 24* 20  CREATININE 1.28* 1.23  CALCIUM 8.9 8.1*  AST 46*  --   ALT 44  --   ALKPHOS 45  --   BILITOT 0.8  --    ------------------------------------------------------------------------------------------------------------------  Cardiac Enzymes No results for input(s): TROPONINI in the last 168 hours. ------------------------------------------------------------------------------------------------------------------  RADIOLOGY:  Ct Abdomen Pelvis W Contrast  Result Date: 02/18/2018 CLINICAL DATA:  Acute onset rectal bleeding EXAM: CT ABDOMEN AND PELVIS WITH CONTRAST TECHNIQUE: Multidetector CT imaging of the abdomen and pelvis was performed using the standard protocol following bolus administration of intravenous contrast. CONTRAST:  166mL ISOVUE-300 IOPAMIDOL (ISOVUE-300) INJECTION 61% COMPARISON:  11/12/2017 FINDINGS: Lower chest: Calcified left hilar lymph nodes are noted consistent with prior granulomatous disease. Hepatobiliary: Multiple gallstones are noted within the gallbladder without complicating factors. The liver shows a tiny hypodensity in the posterior aspect of the right lobe near the dome likely representing a small cyst. This is stable from the prior exam. Pancreas: Unremarkable. No pancreatic ductal dilatation or surrounding inflammatory changes. Spleen: Multiple calcified granulomas are noted. No  mass lesion is seen. Adrenals/Urinary Tract: Adrenal  glands are within normal limits. Kidneys demonstrate cystic lesion is on the right. No obstructive changes are seen. No renal calculi are noted. The bladder is partially distended. Stomach/Bowel: Mild diverticular changes noted without evidence of diverticulitis. The colon is predominately decompressed although some areas of slight increased attenuation are noted in the rectosigmoid junction. This Zertuche be related to prior hemorrhage consistent with the given clinical history. No definitive mass is seen. The appendix is within normal limits. No obstructive or inflammatory changes of the bowel are seen. Vascular/Lymphatic: Aortic atherosclerosis. No enlarged abdominal or pelvic lymph nodes. Reproductive: Prostate is unremarkable. Other: No abdominal wall hernia or abnormality. No abdominopelvic ascites. Musculoskeletal: Degenerative changes of the lumbar spine are noted. IMPRESSION: Slight increased attenuation is noted within the lumen of the colon which Wenger be related to hemorrhage given the clinical history. No areas of active extravasation are noted at this time. Cholelithiasis without complicating factors. Chronic changes as described above stable from the previous exam. Electronically Signed   By: Inez Catalina M.D.   On: 02/18/2018 17:51     ASSESSMENT AND PLAN:   84 year old male with PAF, chronic diastolic heart failure and essential hypertension who presents with bright red blood per rectum.  1. BRBPR: Plan for EGD tomorrow Continue PPI for now  2. PAF: Continue amiodarone Holding anticoagulation due to problem #1.  3. Chronic diastolic heart failure with preserved ejection fraction without signs of exacerbation  4. Essential hypertension: Continue metoprolol  5. Hyperlipidemia: Continue pravastatin Management plans discussed with the patient and he is in agreement.  CODE STATUS: full  TOTAL TIME TAKING CARE OF THIS PATIENT: 30  minutes.     POSSIBLE D/C 2 days, DEPENDING ON CLINICAL CONDITION.   Brookelynn Hamor M.D on 02/19/2018 at 11:59 AM  Between 7am to 6pm - Pager - [phone] After 6pm go to [email or web address] - password [email or web address] [facility]  Office  [phone]  CC: Primary care physician; [name], DO  Note: This dictation was prepared with Dragon dictation along with smaller phrase technology. Any transcriptional errors that result from this process are unintentional.

## 2018-02-19 NOTE — Telephone Encounter (Signed)
Spoke with patients daughter per release form and she reports that patient is currently in the hospital in room 226. He was having some GI bleeding and they brought him in to be seen. She wanted to make sure that Dr. Rockey Situ is aware he has been admitted and advised that he is not covering the hospital but that I would make him aware. She was appreciative for the call with no further questions at this time.

## 2018-02-19 NOTE — Progress Notes (Signed)
Talked with patient about current health concerns and actively listened while he talked about his family and health. Prayed with patient and agreed to return to check in on him after GI tests.

## 2018-02-19 NOTE — Progress Notes (Signed)
Per Dr. Benjie Karvonen give amiodarone and hold metoprolo for soft BP.

## 2018-02-19 NOTE — Care Management (Signed)
This RNCM received notification from [facility] that this patient is open to their agency.

## 2018-02-19 NOTE — Consult Note (Signed)
Eric Lame, MD [facility]  [address] [place], [place] [address] Phone: [phone] Fax : [phone]  Consultation  Referring Provider:     Dr. Jannifer Franklin Primary Care Physician:  [name], DO Primary Gastroenterologist:  Dr. Bonna Gains         Reason for Consultation:     Hematochezia  Date of Admission:  02/18/2018 Date of Consultation:  02/19/2018         HPI:   Eric [name] is a 84 y.o. male was admitted with rectal bleeding.  The patient has been seen in our office this January for abdominal pain.  The patient now reports that he started to have rectal bleeding that started yesterday.  He does report some abdominal pain in the mid lower abdomen in the right lower abdomen.  The patient has been on Eliquis for A. fib.  The patient had a CT scan of the abdomen showed increased attenuation suggestive of blood in the colon without any sign of active extravasation.  Patient was also noted to have cholelithiasis.  He reports that he has had a few bowel movements this morning and believes there was blood in those also.  The patient denies any nausea vomiting fevers or chills.  The patient also denies having any endoscopic procedures in the recent past.  It appears the patient's last colonoscopy was in 2012 and had polyps.  Past Medical History:  Diagnosis Date  . Anxiety   . Arthritis   . Atrial fibrillation ([place]) 05/28/2007    Qualifier: Diagnosis of  By: Council Mechanic MD, Hilaria Ota    . Atypical chest pain    a. 05/2017 MV: no ischemia, EF 79%.  . Chest pain 10/20/2017  . Chronic diastolic CHF (congestive heart failure) ([place])    a. 01/2011 Echo: EF 50-55%, gr1 DD, mild AI, nl RV fxn, mild TR/PR; b. 10/2017 Echo: EF 60-65%, mild LVH, gr2 DD.  Marland Kitchen DDD (degenerative disc disease), cervical   . Depressive disorder, not elsewhere classified   . Difficult intubation   . Dysphagia, oral phase   . Dyspnea   . Edema   . History of DVT (deep vein thrombosis)   . History of kidney stones    . Hypertension   . HYPERTENSION, BENIGN ESSENTIAL 05/28/2007   Qualifier: Diagnosis of  By: Council Mechanic MD, Hilaria Ota   . Hypoxemia   . Impacted cerumen   . Long term current use of anticoagulant 03/02/2011  . LOW BACK PAIN SYNDROME 03/17/2009   Qualifier: Diagnosis of  By: Council Mechanic MD, Hilaria Ota   . Mixed hyperlipidemia   . 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 Other myelopathy   . Pain in limb   . Palpitations   . Paroxysmal Atrial Fibrillation ([place])    a. a. 01/2011 in setting of post-op complications including aspiration pna;  b. CHA2DS2VASc = 4--> Amio/Eliquis.  . Pneumonia 03/06/2003  . Spinal stenosis, unspecified region other than cervical   . 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 Syncope    a. 10/2017-->Event monitor: RSR, rare PACs/PVCs.  . Syncope and collapse 10/20/2017  . Thoracic or lumbosacral neuritis or radiculitis, unspecified     Past Surgical History:  Procedure Laterality Date  . CARDIOVERSION N/A 06/20/2017   Procedure: CARDIOVERSION;  Surgeon: [name], MD;  Location: [facility] ORS;  Service: Cardiovascular;  Laterality: N/A;  . CATARACT EXTRACTION  W/ INTRAOCULAR LENS  IMPLANT, BILATERAL    . CERVICAL FUSION  02/10/2011  . history of abd ultrasound  11/01   fatty liver  . MULTIPLE TOOTH EXTRACTIONS    . ORIF FIBULA FRACTURE Left 01/06/2017   Procedure: OPEN REDUCTION INTERNAL FIXATION (ORIF) FIBULA FRACTURE DISTAL FIBULA;  Surgeon: Melrose Nakayama, MD;  Location: [facility];  Service: Orthopedics;  Laterality: Left;  Patient states has problems if he will have a tube in throat for Genera; Anesthesia    Prior to Admission medications   Medication Sig Start Date End Date Taking? Authorizing Provider  amiodarone (PACERONE) 200 MG tablet Take 1 tablet (200 mg total) by mouth daily. 12/11/17  Yes Gollan, Kathlene November, MD  apixaban (ELIQUIS) 5 MG TABS tablet Take 1 tablet (5 mg total) by mouth 2  (two) times daily. 04/24/17  Yes [name], MD  cetirizine (ZYRTEC) 10 MG tablet Take 1 tablet (10 mg total) by mouth daily. 11/01/17  Yes Lauree Chandler, NP  Cholecalciferol (VITAMIN D) 1000 UNITS capsule Take one tablet once daily 08/09/13  Yes Reed, [name] L, DO  fluticasone (FLONASE) 50 MCG/ACT nasal spray Place 2 sprays daily into both nostrils. 10/27/17 10/27/18 Yes Lauree Chandler, NP  furosemide (LASIX) 40 MG tablet Take 40 mg 2 (two) times daily by mouth.   Yes [provider]  gabapentin (NEURONTIN) 300 MG capsule Take 1 capsule (300 mg total) by mouth 2 (two) times daily. 01/11/18  Yes Reed, [name] L, DO  metoprolol tartrate (LOPRESSOR) 25 MG tablet Take 1 tablet (25 mg total) 2 (two) times daily by mouth. 10/21/17 10/21/18 Yes Georgette Shell, MD  pantoprazole (PROTONIX) 40 MG tablet Take 1 tablet (40 mg total) by mouth at bedtime. 11/01/17  Yes Lauree Chandler, NP  potassium chloride SA (K-DUR,KLOR-CON) 20 MEQ tablet Take 0.5 tablets (10 mEq total) 2 (two) times daily by mouth. 10/21/17  Yes Georgette Shell, MD  pravastatin (PRAVACHOL) 80 MG tablet Take 1 tablet (80 mg total) daily by mouth. 10/23/17  Yes Reed, [name] L, DO  traMADol (ULTRAM) 50 MG tablet Take 50 mg by mouth at bedtime as needed for pain.   Yes [provider]    Family History  Problem Relation Age of Onset  . Stroke Father   . Atrial fibrillation Brother        on coumadin  . Heart disease Brother        AFib- coumadin  . Stroke Paternal Grandfather   . Cancer Other        colon cancer at early age  . Prostate cancer Neg Hx   . Kidney cancer Neg Hx   . Bladder Cancer Neg Hx      Social History   Tobacco Use  . Smoking status: Former Smoker    Years: 1.00  . Smokeless tobacco: Never Used  . Tobacco comment: stopped in 20's  Substance Use Topics  . Alcohol use: No  . Drug use: No    Allergies as of 02/18/2018 - Review Complete 02/18/2018  Allergen  Reaction Noted  . Morphine and related Shortness Of Breath 04/15/2011  . Percocet [oxycodone-acetaminophen] Shortness Of Breath 04/15/2011  . Valium Shortness Of Breath 04/15/2011    Review of Systems:    All systems reviewed and negative except where noted in HPI.   Physical Exam:  Vital signs in last 24 hours: Temp:  [98.1 F (36.7 C)-98.3 F (36.8 C)] 98.1 F (36.7 C) (03/11 1134) Pulse  Rate:  [55-73] 65 (03/11 1134) Resp:  [18-22] 20 (03/11 0512) BP: (104-164)/(52-88) 116/64 (03/11 1134) SpO2:  [87 %-97 %] 96 % (03/11 1134) Weight:  [176 lb 12.9 oz (80.2 kg)-180 lb (81.6 kg)] 176 lb 12.9 oz (80.2 kg) (03/10 2145) Last BM Date: 02/19/18 General:   Pleasant, cooperative in NAD Head:  Normocephalic and atraumatic. Eyes:   No icterus.   Conjunctiva pink. PERRLA. Ears:  Normal auditory acuity. Neck:  Supple; no masses or thyroidomegaly Lungs: Respirations even and unlabored. Lungs clear to auscultation bilaterally.   No wheezes, crackles, or rhonchi.  Heart:  Regular rate and rhythm;  Without murmur, clicks, rubs or gallops Abdomen:  Soft, nondistended, nontender. Normal bowel sounds. No appreciable masses or hepatomegaly.  No rebound or guarding.  Rectal:  Not performed. Msk:  Symmetrical without gross deformities.   Extremities:  Without edema, cyanosis or clubbing. Neurologic:  Alert and oriented x3;  grossly normal neurologically. Skin:  Intact without significant lesions or rashes. Cervical Nodes:  No significant cervical adenopathy. Psych:  Alert and cooperative. Normal affect.  LAB RESULTS: Recent Labs    02/18/18 1601 02/18/18 2257 02/19/18 0415 02/19/18 0947  WBC 8.4  --  6.5  --   HGB 14.2 13.9 13.4 13.2  HCT 42.3  --  39.9*  --   PLT 192  --  161  --    BMET Recent Labs    02/18/18 1601 02/19/18 0415  NA 142 140  K 4.1 3.6  CL 108 109  CO2 27 22  GLUCOSE 108* 86  BUN 24* 20  CREATININE 1.28* 1.23  CALCIUM 8.9 8.1*   LFT Recent Labs     02/18/18 1601  PROT 7.1  ALBUMIN 4.2  AST 46*  ALT 44  ALKPHOS 45  BILITOT 0.8   PT/INR Recent Labs    02/18/18 1725  LABPROT 14.4  INR 1.13    STUDIES: Ct Abdomen Pelvis W Contrast  Result Date: 02/18/2018 CLINICAL DATA:  Acute onset rectal bleeding EXAM: CT ABDOMEN AND PELVIS WITH CONTRAST TECHNIQUE: Multidetector CT imaging of the abdomen and pelvis was performed using the standard protocol following bolus administration of intravenous contrast. CONTRAST:  128mL ISOVUE-300 IOPAMIDOL (ISOVUE-300) INJECTION 61% COMPARISON:  11/12/2017 FINDINGS: Lower chest: Calcified left hilar lymph nodes are noted consistent with prior granulomatous disease. Hepatobiliary: Multiple gallstones are noted within the gallbladder without complicating factors. The liver shows a tiny hypodensity in the posterior aspect of the right lobe near the dome likely representing a small cyst. This is stable from the prior exam. Pancreas: Unremarkable. No pancreatic ductal dilatation or surrounding inflammatory changes. Spleen: Multiple calcified granulomas are noted. No mass lesion is seen. Adrenals/Urinary Tract: Adrenal glands are within normal limits. Kidneys demonstrate cystic lesion is on the right. No obstructive changes are seen. No renal calculi are noted. The bladder is partially distended. Stomach/Bowel: Mild diverticular changes noted without evidence of diverticulitis. The colon is predominately decompressed although some areas of slight increased attenuation are noted in the rectosigmoid junction. This Horine be related to prior hemorrhage consistent with the given clinical history. No definitive mass is seen. The appendix is within normal limits. No obstructive or inflammatory changes of the bowel are seen. Vascular/Lymphatic: Aortic atherosclerosis. No enlarged abdominal or pelvic lymph nodes. Reproductive: Prostate is unremarkable. Other: No abdominal wall hernia or abnormality. No abdominopelvic ascites.  Musculoskeletal: Degenerative changes of the lumbar spine are noted. IMPRESSION: Slight increased attenuation is noted within the lumen of the colon which Verdone  be related to hemorrhage given the clinical history. No areas of active extravasation are noted at this time. Cholelithiasis without complicating factors. Chronic changes as described above stable from the previous exam. Electronically Signed   By: Inez Catalina M.D.   On: 02/18/2018 17:51      Impression / Plan:   Karston Hyland Oshel is a 84 y.o. y/o male with hematochezia and a stable hemoglobin with his admission hemoglobin of 14.2 that is 13.2 today the hydration.  The patient will be set up for a colonoscopy for tomorrow.  The patient has been told that the differential diagnosis includes diverticular bleed versus ischemic colitis versus neoplasm versus hemorrhoidal bleeding. I have discussed risks & benefits which include, but are not limited to, bleeding, infection, perforation & drug reaction.  The patient agrees with this plan & written consent will be obtained.     Thank you for involving me in the care of this patient.      LOS: 1 day   Eric Lame, MD  02/19/2018, 12:03 PM   Note: This dictation was prepared with Dragon dictation along with smaller phrase technology. Any transcriptional errors that result from this process are unintentional.

## 2018-02-19 NOTE — Telephone Encounter (Signed)
Patient daughter Jenny Reichmann stopped by office Wanted to make Dr Rockey Situ aware that patient is in hospital

## 2018-02-19 NOTE — Progress Notes (Signed)
Per Dr. Benjie Karvonen okay to place pt on clear liquid diet.

## 2018-02-20 ENCOUNTER — Encounter: Payer: Self-pay | Admitting: Anesthesiology

## 2018-02-20 ENCOUNTER — Inpatient Hospital Stay: Payer: PPO | Admitting: Certified Registered"

## 2018-02-20 ENCOUNTER — Encounter
Admission: EM | Disposition: A | Discharge status: Home / Self Care | Payer: Self-pay | Source: Home / Self Care | Attending: Internal Medicine

## 2018-02-20 DIAGNOSIS — K559 Vascular disorder of intestine, unspecified: Secondary | ICD-10-CM

## 2018-02-20 DIAGNOSIS — K921 Melena: Secondary | ICD-10-CM

## 2018-02-20 HISTORY — PX: COLONOSCOPY WITH PROPOFOL: SHX5780

## 2018-02-20 LAB — CBC
HCT: 40 % (ref 40.0–52.0)
Hemoglobin: 13.4 g/dL (ref 13.0–18.0)
MCH: 32.6 pg (ref 26.0–34.0)
MCHC: 33.5 g/dL (ref 32.0–36.0)
MCV: 97.1 fL (ref 80.0–100.0)
PLATELETS: 148 10*3/uL — AB (ref 150–440)
RBC: 4.12 MIL/uL — AB (ref 4.40–5.90)
RDW: 13.2 % (ref 11.5–14.5)
WBC: 6.1 10*3/uL (ref 3.8–10.6)

## 2018-02-20 LAB — HEMOGLOBIN
HEMOGLOBIN: 13.4 g/dL (ref 13.0–18.0)
Hemoglobin: 13 g/dL (ref 13.0–18.0)
Hemoglobin: 13 g/dL (ref 13.0–18.0)

## 2018-02-20 SURGERY — COLONOSCOPY WITH PROPOFOL
Anesthesia: General

## 2018-02-20 MED ORDER — LIDOCAINE HCL (CARDIAC) 20 MG/ML IV SOLN
INTRAVENOUS | Status: DC | PRN
  Administered 2018-02-20: 50 mg via INTRAVENOUS

## 2018-02-20 MED ORDER — LIDOCAINE HCL (PF) 2 % IJ SOLN
INTRAMUSCULAR | Status: AC
  Filled 2018-02-20: qty 10

## 2018-02-20 MED ORDER — SODIUM CHLORIDE 0.9 % IV SOLN
INTRAVENOUS | Status: DC | PRN
  Administered 2018-02-20: 11:00:00 via INTRAVENOUS

## 2018-02-20 MED ORDER — PROPOFOL 500 MG/50ML IV EMUL
INTRAVENOUS | Status: AC
  Filled 2018-02-20: qty 50

## 2018-02-20 MED ORDER — PROPOFOL 500 MG/50ML IV EMUL
INTRAVENOUS | Status: DC | PRN
  Administered 2018-02-20: 125 ug/kg/min via INTRAVENOUS

## 2018-02-20 MED ORDER — PRAVASTATIN SODIUM 40 MG PO TABS
80.0000 mg | ORAL_TABLET | Freq: Every day | ORAL | Status: DC
  Administered 2018-02-20: 80 mg via ORAL
  Filled 2018-02-20: qty 1
  Filled 2018-02-20 (×2): qty 2
  Filled 2018-02-20: qty 4

## 2018-02-20 MED ORDER — PROPOFOL 10 MG/ML IV BOLUS
INTRAVENOUS | Status: AC
  Filled 2018-02-20: qty 20

## 2018-02-20 MED ORDER — LIDOCAINE HCL (PF) 1 % IJ SOLN
INTRAMUSCULAR | Status: AC
  Filled 2018-02-20: qty 2

## 2018-02-20 NOTE — Transfer of Care (Signed)
Immediate Anesthesia Transfer of Care Note  Patient: Eric [name]  Procedure(s) Performed: COLONOSCOPY WITH PROPOFOL (N/A )  Patient Location: PACU  Anesthesia Type:General  Level of Consciousness: drowsy and patient cooperative  Airway & Oxygen Therapy: Patient Spontanous Breathing and Patient connected to nasal cannula oxygen  Post-op Assessment: Report given to RN, Post -op Vital signs reviewed and stable and Patient moving all extremities X 4  Post vital signs: Reviewed and stable  Last Vitals:  Vitals:   02/20/18 1005 02/20/18 1200  BP: (!) 152/67 118/66  Pulse: 64   Resp:  14  Temp:  (!) 36.2 C  SpO2:  97%    Last Pain:  Vitals:   02/20/18 1200  TempSrc: Tympanic  PainSc: Asleep         Complications: No apparent anesthesia complications

## 2018-02-20 NOTE — Progress Notes (Signed)
Per Dr. Allen Norris place order for low residual diet.

## 2018-02-20 NOTE — Anesthesia Post-op Follow-up Note (Signed)
Anesthesia QCDR form completed.        

## 2018-02-20 NOTE — Addendum Note (Signed)
Addendum  created 02/20/18 1208 by Nile Riggs, CRNA   Intraprocedure Meds edited

## 2018-02-20 NOTE — Anesthesia Preprocedure Evaluation (Signed)
Anesthesia Evaluation  Patient identified by MRN, date of birth, ID band Patient awake    Reviewed: Allergy & Precautions, NPO status , Patient's Chart, lab work & pertinent test results  History of Anesthesia Complications (+) DIFFICULT AIRWAY and history of anesthetic complications (reports hx of problems with aspiration post neck surgery)  Airway Mallampati: III  TM Distance: >3 FB Neck ROM: Full    Dental  (+) Poor Dentition, Partial Lower   Pulmonary shortness of breath and with exertion, neg sleep apnea, neg COPD, former smoker,           Cardiovascular Exercise Tolerance: Poor hypertension, Pt. on medications (-) angina+CHF (preserved EF)  (-) CAD, (-) Past MI, (-) Cardiac Stents and (-) CABG + dysrhythmias Atrial Fibrillation (-) Valvular Problems/Murmurs - Systolic murmurs Echo 7/59/16: - Left ventricle: The cavity size was normal. There was mild   concentric hypertrophy. Systolic function was normal. The   estimated ejection fraction was in the range of 55% to 60%. Wall   motion was normal; there were no regional wall motion   abnormalities. The study was not technically sufficient to allow   evaluation of LV diastolic dysfunction due to atrial   fibrillation. - Aortic valve: There was mild regurgitation. - Left atrium: The atrium was mildly dilated.   Neuro/Psych PSYCHIATRIC DISORDERS Anxiety Depression negative neurological ROS     GI/Hepatic Neg liver ROS, GERD  ,  Endo/Other  negative endocrine ROSneg diabetes  Renal/GU negative Renal ROS     Musculoskeletal  (+) Arthritis ,   Abdominal (+) - obese,   Peds  Hematology  (+) anemia ,   Anesthesia Other Findings Past Medical History: No date: Anxiety No date: Arthritis No date: Chronic diastolic CHF (congestive heart failur*     Comment: a. 01/2011 Echo: EF 50-55%, gr1 DD, mild AI, nl              RV fxn, mild TR/PR. No date: DDD (degenerative disc  disease), cervical No date: Depressive disorder, not elsewhere classified No date: Depressive disorder, not elsewhere classified No date: Difficult intubation No date: DVT (deep venous thrombosis) ([facility]) No date: Dysphagia, oral phase No date: Dyspnea No date: Edema No date: History of kidney stones No date: Hypertension No date: Hypoxemia No date: Impacted cerumen No date: Mixed hyperlipidemia No date: Nonunion of foot fracture     Comment: left distal fibula non-union No date: Other myelopathy No date: Pain in limb No date: Palpitations No date: Paroxysmal Atrial Fibrillation [facility])     Comment: a. a. 01/2011 in setting of post-op               complications including aspiration pna;  b.               CHA2DS2VASc = 4. 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, exce*     Comment: skin cancer of shoulder No date: Thoracic or lumbosacral neuritis or radiculiti*   Reproductive/Obstetrics                             Anesthesia Physical  Anesthesia Plan  ASA: III  Anesthesia Plan: General   Post-op Pain Management:    Induction: Intravenous  PONV Risk Score and Plan: 2 and Propofol infusion  Airway Management Planned: Nasal Cannula and Natural Airway  Additional Equipment:   Intra-op Plan:   Post-operative Plan:  Informed Consent: I have reviewed the patients History and Physical, chart, labs and discussed the procedure including the risks, benefits and alternatives for the proposed anesthesia with the patient or authorized representative who has indicated his/her understanding and acceptance.   Dental advisory given  Plan Discussed with: CRNA and Anesthesiologist  Anesthesia Plan Comments:         Anesthesia Quick Evaluation

## 2018-02-20 NOTE — Op Note (Signed)
[facility] Gastroenterology Patient Name: Eric [name] Procedure Date: 02/20/2018 11:38 AM MRN: [id number] Account #: [id number] Date of Birth: Eric [name] 27, 1933 Admit Type: Inpatient Age: 84 Room: [facility] ENDO ROOM 4 Gender: Male Note Status: Finalized Procedure:            Colonoscopy Indications:          Hematochezia Providers:            Lucilla Lame MD, MD Referring MD:         [name] (Referring MD) Medicines:            Propofol per Anesthesia Complications:        No immediate complications. Procedure:            Pre-Anesthesia Assessment:                       - Prior to the procedure, a History and Physical was                        performed, and patient medications and allergies were                        reviewed. The patient's tolerance of previous                        anesthesia was also reviewed. The risks and benefits of                        the procedure and the sedation options and risks were                        discussed with the patient. All questions were                        answered, and informed consent was obtained. Prior                        Anticoagulants: The patient has taken no previous                        anticoagulant or antiplatelet agents. ASA Grade                        Assessment: III - A patient with severe systemic                        disease. After reviewing the risks and benefits, the                        patient was deemed in satisfactory condition to undergo                        the procedure.                       After obtaining informed consent, the colonoscope was                        passed under direct vision. Throughout the procedure,  the patient's blood pressure, pulse, and oxygen                        saturations were monitored continuously. The                        Colonoscope was introduced through the anus with the                        intention of advancing to  the cecum. The scope was                        advanced to the sigmoid colon before the procedure was                        aborted. Medications were given. The colonoscopy was                        performed without difficulty. The patient tolerated the                        procedure well. The quality of the bowel preparation                        was good. Findings:      The perianal and digital rectal examinations were normal.      Diffuse severe inflammation characterized by confluent ulcerations was       found in the sigmoid colon. Biopsies were taken with a cold forceps for       histology. Impression:           - Diffuse severe inflammation was found in the sigmoid                        colon secondary to ischemic colitis. Biopsied. Recommendation:       - Return patient to hospital ward for ongoing care.                       - Await pathology results. Procedure Code(s):    --- Professional ---                       [phone], 1, Colonoscopy, flexible; with biopsy, single                        or multiple Diagnosis Code(s):    --- Professional ---                       K92.1, Melena (includes Hematochezia)                       K55.9, Vascular disorder of intestine, unspecified CPT copyright 2016 American Medical Association. All rights reserved. The codes documented in this report are preliminary and upon coder review [place]  be revised to meet current compliance requirements. Lucilla Lame MD, MD 02/20/2018 12:01:05 PM This report has been signed electronically. Number of Addenda: 0 Note Initiated On: 02/20/2018 11:38 AM Scope Withdrawal Time: 0 hours 4 minutes 6 seconds  Total Procedure Duration: 0 hours 4 minutes 47 seconds       [facility]

## 2018-02-20 NOTE — Progress Notes (Signed)
[facility] [place] NAME: Eric [name]    MR#:  [id number]  DATE OF BIRTH:  [birth year]  SUBJECTIVE:   Patient finished bowel prep this am   REVIEW OF SYSTEMS:    Review of Systems  Constitutional: Negative for fever, chills weight loss HENT: Negative for ear pain, nosebleeds, congestion, facial swelling, rhinorrhea, neck pain, neck stiffness and ear discharge.   Respiratory: Negative for cough, shortness of breath, wheezing  Cardiovascular: Negative for chest pain, palpitations and leg swelling.  Gastrointestinal: positive for bright red blood per rectum. No abdominal pain, nausea or vomiting. No melena. Genitourinary: Negative for dysuria, urgency, frequency, hematuria Musculoskeletal: Negative for back pain or joint pain Neurological: Negative for dizziness, seizures, syncope, focal weakness,  numbness and headaches.  Hematological: Does not bruise/bleed easily.  Psychiatric/Behavioral: Negative for hallucinations, confusion, dysphoric mood    Tolerating Diet: clears      DRUG ALLERGIES:   Allergies  Allergen Reactions  . Morphine And Related Shortness Of Breath  . Percocet [Oxycodone-Acetaminophen] Shortness Of Breath  . Valium Shortness Of Breath    VITALS:  Blood pressure (!) 116/58, pulse 62, temperature 97.9 F (36.6 C), temperature source Oral, resp. rate 20, height 5\' 7"  (1.702 m), weight 80.2 kg (176 lb 12.9 oz), SpO2 96 %.  PHYSICAL EXAMINATION:  Constitutional: Appears well-developed and well-nourished. No distress. HENT: Normocephalic. Marland Kitchen Oropharynx is clear and moist.  Eyes: Conjunctivae and EOM are normal. PERRLA, no scleral icterus.  Neck: Normal ROM. Neck supple. No JVD. No tracheal deviation. CVS: RRR, S1/S2 +, no murmurs, no gallops, no carotid bruit.  Pulmonary: Effort and breath sounds normal, no stridor, rhonchi, wheezes, rales.  Abdominal: Soft. BS +,  no distension, tenderness, rebound or guarding.   Musculoskeletal: Normal range of motion. No edema and no tenderness.  Neuro: Alert. CN 2-12 grossly intact. No focal deficits. Skin: Skin is warm and dry. No rash noted. Psychiatric: Normal mood and affect.      LABORATORY PANEL:   CBC Recent Labs  Lab 02/20/18 0424  WBC 6.1  HGB 13.4  HCT 40.0  PLT 148*   ------------------------------------------------------------------------------------------------------------------  Chemistries  Recent Labs  Lab 02/18/18 1601 02/19/18 0415  NA 142 140  K 4.1 3.6  CL 108 109  CO2 27 22  GLUCOSE 108* 86  BUN 24* 20  CREATININE 1.28* 1.23  CALCIUM 8.9 8.1*  AST 46*  --   ALT 44  --   ALKPHOS 45  --   BILITOT 0.8  --    ------------------------------------------------------------------------------------------------------------------  Cardiac Enzymes No results for input(s): TROPONINI in the last 168 hours. ------------------------------------------------------------------------------------------------------------------  RADIOLOGY:  Ct Abdomen Pelvis W Contrast  Result Date: 02/18/2018 CLINICAL DATA:  Acute onset rectal bleeding EXAM: CT ABDOMEN AND PELVIS WITH CONTRAST TECHNIQUE: Multidetector CT imaging of the abdomen and pelvis was performed using the standard protocol following bolus administration of intravenous contrast. CONTRAST:  144mL ISOVUE-300 IOPAMIDOL (ISOVUE-300) INJECTION 61% COMPARISON:  11/12/2017 FINDINGS: Lower chest: Calcified left hilar lymph nodes are noted consistent with prior granulomatous disease. Hepatobiliary: Multiple gallstones are noted within the gallbladder without complicating factors. The liver shows a tiny hypodensity in the posterior aspect of the right lobe near the dome likely representing a small cyst. This is stable from the prior exam. Pancreas: Unremarkable. No pancreatic ductal dilatation or surrounding inflammatory changes. Spleen: Multiple calcified granulomas are noted. No mass lesion is  seen. Adrenals/Urinary Tract: Adrenal glands are within normal limits. Kidneys demonstrate cystic  lesion is on the right. No obstructive changes are seen. No renal calculi are noted. The bladder is partially distended. Stomach/Bowel: Mild diverticular changes noted without evidence of diverticulitis. The colon is predominately decompressed although some areas of slight increased attenuation are noted in the rectosigmoid junction. This [place] be related to prior hemorrhage consistent with the given clinical history. No definitive mass is seen. The appendix is within normal limits. No obstructive or inflammatory changes of the bowel are seen. Vascular/Lymphatic: Aortic atherosclerosis. No enlarged abdominal or pelvic lymph nodes. Reproductive: Prostate is unremarkable. Other: No abdominal wall hernia or abnormality. No abdominopelvic ascites. Musculoskeletal: Degenerative changes of the lumbar spine are noted. IMPRESSION: Slight increased attenuation is noted within the lumen of the colon which Cu be related to hemorrhage given the clinical history. No areas of active extravasation are noted at this time. Cholelithiasis without complicating factors. Chronic changes as described above stable from the previous exam. Electronically Signed   By: Inez Catalina M.D.   On: 02/18/2018 17:51     ASSESSMENT AND PLAN:   84 year old male with PAF, chronic diastolic heart failure and essential hypertension who presents with bright red blood per rectum.  1. BRBPR: Plan for colonoscopy today Hemoglobin remained stable  2. PAF: Continue amiodarone Holding anticoagulation due to problem #1.  3. Chronic diastolic heart failure with preserved ejection fraction without signs of exacerbation  4. Essential hypertension: Continue metoprolol  5. Hyperlipidemia: Continue pravastatin Management plans discussed with the patient and he is in agreement.  CODE STATUS: full  TOTAL TIME TAKING CARE OF THIS PATIENT: 22 minutes.      POSSIBLE D/C TOMORROW DEPENDING ON CLINICAL CONDITION.   Cheney Gosch M.D on 02/20/2018 at 8:19 AM  Between 7am to 6pm - Pager - [phone] After 6pm go to [email or web address] - password [email or web address] [facility]  Office  [phone]  CC: Primary care physician; [name], DO  Note: This dictation was prepared with Dragon dictation along with smaller phrase technology. Any transcriptional errors that result from this process are unintentional.

## 2018-02-20 NOTE — Anesthesia Postprocedure Evaluation (Signed)
Anesthesia Post Note  Patient: [name]  Procedure(s) Performed: COLONOSCOPY WITH PROPOFOL (N/A )  Patient location during evaluation: Endoscopy Anesthesia Type: General Level of consciousness: awake and alert and oriented Pain management: pain level controlled Vital Signs Assessment: post-procedure vital signs reviewed and stable Respiratory status: spontaneous breathing and respiratory function stable Cardiovascular status: blood pressure returned to baseline and stable Postop Assessment: no headache, no backache, patient able to bend at knees, no apparent nausea or vomiting and adequate PO intake Anesthetic complications: no     Last Vitals:  Vitals:   02/20/18 1005 02/20/18 1200  BP: (!) 152/67 118/66  Pulse: 64   Resp:  14  Temp:  (!) 36.2 C  SpO2:  97%    Last Pain:  Vitals:   02/20/18 1200  TempSrc: Tympanic  PainSc: Asleep                 Lexie Morini H Trini Christiansen

## 2018-02-21 ENCOUNTER — Encounter: Payer: Self-pay | Admitting: Gastroenterology

## 2018-02-21 ENCOUNTER — Telehealth: Payer: Self-pay | Admitting: Cardiovascular Disease

## 2018-02-21 LAB — HEMOGLOBIN
HEMOGLOBIN: 13.9 g/dL (ref 13.0–18.0)
Hemoglobin: 13.6 g/dL (ref 13.0–18.0)
Hemoglobin: 14.3 g/dL (ref 13.0–18.0)

## 2018-02-21 NOTE — Telephone Encounter (Signed)
Cardiac and anti-coagulant clearance obtained at this time. Clearances can be found in patient's chart under media tab.

## 2018-02-21 NOTE — Telephone Encounter (Signed)
Patient daughter came by to give an update Stated they found ischemic colitis and did a procedure  Patient was doing well until they went to get him up and he got sick They are hoping for a release tonight but still unsure at this time

## 2018-02-21 NOTE — Telephone Encounter (Signed)
Error

## 2018-02-21 NOTE — Discharge Summary (Signed)
[facility] [place] NAME: Eric [name]    MR#:  [id number]  DATE OF BIRTH:  08-28-1932  DATE OF ADMISSION:  02/18/2018 ADMITTING PHYSICIAN: Lance Coon, MD  DATE OF DISCHARGE: 02/21/2018  PRIMARY CARE PHYSICIAN: [name], DO    ADMISSION DIAGNOSIS:  Lower GI bleed [K92.2]  DISCHARGE DIAGNOSIS:  Principal Problem:   BRBPR (bright red blood per rectum) Active Problems:   Hyperlipidemia   Anxiety   PAF (paroxysmal atrial fibrillation) (HCC)   Hypertension   Chronic diastolic CHF (congestive heart failure) (HCC)   Lower GI bleed   Blood in stool   Ischemic bowel disease ([place])   SECONDARY DIAGNOSIS:   Past Medical History:  Diagnosis Date  . Anxiety   . Arthritis   . Atrial fibrillation ([place]) 05/28/2007    Qualifier: Diagnosis of  By: Council Mechanic MD, Hilaria Ota    . Atypical chest pain    a. 05/2017 MV: no ischemia, EF 79%.  . Chest pain 10/20/2017  . Chronic diastolic CHF (congestive heart failure) ([place])    a. 01/2011 Echo: EF 50-55%, gr1 DD, mild AI, nl RV fxn, mild TR/PR; b. 10/2017 Echo: EF 60-65%, mild LVH, gr2 DD.  Marland Kitchen DDD (degenerative disc disease), cervical   . Depressive disorder, not elsewhere classified   . Difficult intubation   . Dysphagia, oral phase   . Dyspnea   . Edema   . History of DVT (deep vein thrombosis)   . History of kidney stones   . Hypertension   . HYPERTENSION, BENIGN ESSENTIAL 05/28/2007   Qualifier: Diagnosis of  By: Council Mechanic MD, Hilaria Ota   . Hypoxemia   . Impacted cerumen   . Long term current use of anticoagulant 03/02/2011  . LOW BACK PAIN SYNDROME 03/17/2009   Qualifier: Diagnosis of  By: Council Mechanic MD, Hilaria Ota   . Mixed hyperlipidemia   . 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 Other myelopathy   . Pain in limb   . Palpitations   . Paroxysmal Atrial Fibrillation ([place])    a. a. 01/2011 in setting of post-op complications including aspiration pna;  b. CHA2DS2VASc =  4--> Amio/Eliquis.  . Pneumonia 03/06/2003  . Spinal stenosis, unspecified region other than cervical   . 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 Syncope    a. 10/2017-->Event monitor: RSR, rare PACs/PVCs.  . Syncope and collapse 10/20/2017  . Thoracic or lumbosacral neuritis or radiculitis, unspecified     HOSPITAL COURSE:    84 year old male with PAF, chronic diastolic heart failure and essential hypertension who presents with bright red blood per rectum.  1. BRBPR: Patient had evaluation by GI. He underwent colonoscopy which shows ischemic colitis. His hemoglobin remained stable. He will follow up with GI in 2 weeks for biopsy results that were taken.   2. PAF: Continue amiodarone As per my conversation with Sherlynn Stalls enterologist he Vigil resume anticoagulation.  3. Chronic diastolic heart failure with preserved ejection fraction without signs of exacerbation  4. Essential hypertension: Continue metoprolol  5. Hyperlipidemia: Continue pravastatin    DISCHARGE CONDITIONS AND DIET:   Stable for discharge on heart healthy diet  CONSULTS OBTAINED:  Treatment Team:  Lucilla Lame, MD  DRUG ALLERGIES:   Allergies  Allergen Reactions  . Morphine And Related Shortness Of Breath  . Percocet [Oxycodone-Acetaminophen] Shortness Of Breath  . Valium Shortness Of  Breath    DISCHARGE MEDICATIONS:   Allergies as of 02/21/2018      Reactions   Morphine And Related Shortness Of Breath   Percocet [oxycodone-acetaminophen] Shortness Of Breath   Valium Shortness Of Breath      Medication List    TAKE these medications   amiodarone 200 MG tablet Commonly known as:  PACERONE Take 1 tablet (200 mg total) by mouth daily.   apixaban 5 MG Tabs tablet Commonly known as:  ELIQUIS Take 1 tablet (5 mg total) by mouth 2 (two) times daily.   cetirizine 10 MG tablet Commonly known as:  ZYRTEC Take 1 tablet (10 mg total) by mouth  daily.   fluticasone 50 MCG/ACT nasal spray Commonly known as:  FLONASE Place 2 sprays daily into both nostrils.   furosemide 40 MG tablet Commonly known as:  LASIX Take 40 mg 2 (two) times daily by mouth.   gabapentin 300 MG capsule Commonly known as:  NEURONTIN Take 1 capsule (300 mg total) by mouth 2 (two) times daily.   metoprolol tartrate 25 MG tablet Commonly known as:  LOPRESSOR Take 1 tablet (25 mg total) 2 (two) times daily by mouth.   pantoprazole 40 MG tablet Commonly known as:  PROTONIX Take 1 tablet (40 mg total) by mouth at bedtime.   potassium chloride SA 20 MEQ tablet Commonly known as:  K-DUR,KLOR-CON Take 0.5 tablets (10 mEq total) 2 (two) times daily by mouth.   pravastatin 80 MG tablet Commonly known as:  PRAVACHOL Take 1 tablet (80 mg total) daily by mouth.   traMADol 50 MG tablet Commonly known as:  ULTRAM Take 50 mg by mouth at bedtime as needed for pain.   Vitamin D 1000 units capsule Take one tablet once daily         Today   CHIEF COMPLAINT:   No acute issues overnight. Patient ready to be discharged home   VITAL SIGNS:  Blood pressure (!) 119/55, pulse 60, temperature 98.4 F (36.9 C), temperature source Oral, resp. rate 17, height 5\' 7"  (1.702 m), weight 80.2 kg (176 lb 12.9 oz), SpO2 95 %.   REVIEW OF SYSTEMS:  Review of Systems  Constitutional: Negative.  Negative for chills, fever and malaise/fatigue.  HENT: Negative.  Negative for ear discharge, ear pain, hearing loss, nosebleeds and sore throat.   Eyes: Negative.  Negative for blurred vision and pain.  Respiratory: Negative.  Negative for cough, hemoptysis, shortness of breath and wheezing.   Cardiovascular: Negative.  Negative for chest pain, palpitations and leg swelling.  Gastrointestinal: Negative.  Negative for abdominal pain, blood in stool, diarrhea, nausea and vomiting.  Genitourinary: Negative.  Negative for dysuria.  Musculoskeletal: Negative.  Negative for  back pain.  Skin: Negative.   Neurological: Negative for dizziness, tremors, speech change, focal weakness, seizures and headaches.  Endo/Heme/Allergies: Negative.  Does not bruise/bleed easily.  Psychiatric/Behavioral: Negative.  Negative for depression, hallucinations and suicidal ideas.     PHYSICAL EXAMINATION:  GENERAL:  84 y.o.-year-old patient lying in the bed with no acute distress.  NECK:  Supple, no jugular venous distention. No thyroid enlargement, no tenderness.  LUNGS: Normal breath sounds bilaterally, no wheezing, rales,rhonchi  No use of accessory muscles of respiration.  CARDIOVASCULAR: S1, S2 normal. No murmurs, rubs, or gallops.  ABDOMEN: Soft, non-tender, non-distended. Bowel sounds present. No organomegaly or mass.  EXTREMITIES: No pedal edema, cyanosis, or clubbing.  PSYCHIATRIC: The patient is alert and oriented x 3.  SKIN: No obvious rash, lesion,  or ulcer.   DATA REVIEW:   CBC Recent Labs  Lab 02/20/18 0424  02/21/18 0419  WBC 6.1  --   --   HGB 13.4   < > 13.6  HCT 40.0  --   --   PLT 148*  --   --    < > = values in this interval not displayed.    Chemistries  Recent Labs  Lab 02/18/18 1601 02/19/18 0415  NA 142 140  K 4.1 3.6  CL 108 109  CO2 27 22  GLUCOSE 108* 86  BUN 24* 20  CREATININE 1.28* 1.23  CALCIUM 8.9 8.1*  AST 46*  --   ALT 44  --   ALKPHOS 45  --   BILITOT 0.8  --     Cardiac Enzymes No results for input(s): TROPONINI in the last 168 hours.  Microbiology Results  @[id number] @  RADIOLOGY:  No results found.    Allergies as of 02/21/2018      Reactions   Morphine And Related Shortness Of Breath   Percocet [oxycodone-acetaminophen] Shortness Of Breath   Valium Shortness Of Breath      Medication List    TAKE these medications   amiodarone 200 MG tablet Commonly known as:  PACERONE Take 1 tablet (200 mg total) by mouth daily.   apixaban 5 MG Tabs tablet Commonly known as:  ELIQUIS Take 1 tablet (5 mg  total) by mouth 2 (two) times daily.   cetirizine 10 MG tablet Commonly known as:  ZYRTEC Take 1 tablet (10 mg total) by mouth daily.   fluticasone 50 MCG/ACT nasal spray Commonly known as:  FLONASE Place 2 sprays daily into both nostrils.   furosemide 40 MG tablet Commonly known as:  LASIX Take 40 mg 2 (two) times daily by mouth.   gabapentin 300 MG capsule Commonly known as:  NEURONTIN Take 1 capsule (300 mg total) by mouth 2 (two) times daily.   metoprolol tartrate 25 MG tablet Commonly known as:  LOPRESSOR Take 1 tablet (25 mg total) 2 (two) times daily by mouth.   pantoprazole 40 MG tablet Commonly known as:  PROTONIX Take 1 tablet (40 mg total) by mouth at bedtime.   potassium chloride SA 20 MEQ tablet Commonly known as:  K-DUR,KLOR-CON Take 0.5 tablets (10 mEq total) 2 (two) times daily by mouth.   pravastatin 80 MG tablet Commonly known as:  PRAVACHOL Take 1 tablet (80 mg total) daily by mouth.   traMADol 50 MG tablet Commonly known as:  ULTRAM Take 50 mg by mouth at bedtime as needed for pain.   Vitamin D 1000 units capsule Take one tablet once daily        }   Management plans discussed with the patient and he is in agreement. Stable for discharge home  Patient should follow up with pcp  CODE STATUS:     Code Status Orders  (From admission, onward)        Start     Ordered   02/18/18 2251  Full code  Continuous     02/18/18 2250    Code Status History    Date Active Date Inactive Code Status Order ID Comments User Context   10/20/2017 00:55 10/21/2017 21:27 Full Code [id number]  Norval Morton, MD ED    Advance Directive Documentation     Most Recent Value  Type of Advance Directive  Living will  Pre-existing out of facility DNR order (yellow form or pink MOST form)  No data  "MOST" Form in Place?  No data      TOTAL TIME TAKING CARE OF THIS PATIENT: 38 minutes.    Note: This dictation was prepared with Dragon dictation along  with smaller phrase technology. Any transcriptional errors that result from this process are unintentional.  Melady Chow M.D on 02/21/2018 at 9:42 AM  Between 7am to 6pm - Pager - [phone] After 6pm go to [email or web address] - password [email or web address] [facility]  Office  [phone]  CC: Primary care physician; [name], DO

## 2018-02-21 NOTE — Care Management Important Message (Signed)
Important Message  Patient Details  Name: Eric [name] MRN: [id number] Date of Birth: 07-07-1932   Medicare Important Message Given:  Yes    Beverly Sessions, RN 02/21/2018, 11:47 AM

## 2018-02-21 NOTE — Care Management Note (Signed)
Case Management Note  Patient Details  Name: Leandre Wien Krehbiel MRN: [id number] Date of Birth: Jul 17, 1932   Patient to discharge with resumption of home health services through [facility].  Sonia Side and Peterson Ao with [facility] notified of discharge.   Subjective/Objective:                    Action/Plan:   Expected Discharge Date:  02/21/18               Expected Discharge Plan:  [place]  In-House Referral:     Discharge planning Services  CM Consult  Post Acute Care Choice:  Home Health, Resumption of Svcs/PTA Provider Choice offered to:     DME Arranged:    DME Agency:     HH Arranged:  RN, PT [place] Agency:  [facility] (now [facility])  Status of Service:  Completed, signed off  If discussed at [employer] of Stay Meetings, dates discussed:    Additional Comments:  Beverly Sessions, RN 02/21/2018, 2:46 PM

## 2018-02-21 NOTE — Progress Notes (Addendum)
Discharge teaching given to patient, patient verbalized understanding and had no questions. Patient IV's removed. Patient will be transported home by family. All patient belongings gathered prior to leaving. Prior to patient being wheeled out for discharge patient began coughing and c/o epigastric pain the family then became concerned about patient leaving. Dr. Benjie Karvonen contacted and gave orders to allow patient to stay for a few hours for observation. The patient has not had any further episodes that would require him to stay longer therefore, patient will be discharged.

## 2018-02-22 LAB — SURGICAL PATHOLOGY

## 2018-02-22 NOTE — Telephone Encounter (Signed)
Surgical clearance was obtained.

## 2018-02-28 ENCOUNTER — Ambulatory Visit: Payer: Self-pay | Admitting: Nurse Practitioner

## 2018-03-01 ENCOUNTER — Ambulatory Visit: Payer: PPO | Admitting: Cardiovascular Disease

## 2018-03-01 ENCOUNTER — Telehealth: Payer: Self-pay

## 2018-03-01 NOTE — Telephone Encounter (Signed)
Pt notified of colon results.

## 2018-03-01 NOTE — Telephone Encounter (Signed)
-----   Message from Lucilla Lame, MD sent at 02/23/2018  2:13 PM EDT ----- Let the patient know that the biopsies of his colon were consistent with our impression that this was ischemic colitis.

## 2018-03-09 ENCOUNTER — Ambulatory Visit (INDEPENDENT_AMBULATORY_CARE_PROVIDER_SITE_OTHER): Payer: PPO | Admitting: Nurse Practitioner

## 2018-03-09 ENCOUNTER — Telehealth: Payer: Self-pay | Admitting: Surgery

## 2018-03-09 ENCOUNTER — Encounter: Payer: Self-pay | Admitting: Nurse Practitioner

## 2018-03-09 ENCOUNTER — Ambulatory Visit (INDEPENDENT_AMBULATORY_CARE_PROVIDER_SITE_OTHER): Payer: PPO

## 2018-03-09 ENCOUNTER — Telehealth: Payer: Self-pay | Admitting: Cardiovascular Disease

## 2018-03-09 VITALS — BP 120/72 | HR 54 | Temp 98.0°F | Ht 67.0 in | Wt 177.0 lb

## 2018-03-09 DIAGNOSIS — I10 Essential (primary) hypertension: Secondary | ICD-10-CM

## 2018-03-09 DIAGNOSIS — Z0000 Encounter for general adult medical examination without abnormal findings: Secondary | ICD-10-CM | POA: Diagnosis not present

## 2018-03-09 DIAGNOSIS — R531 Weakness: Secondary | ICD-10-CM | POA: Diagnosis not present

## 2018-03-09 DIAGNOSIS — I5032 Chronic diastolic (congestive) heart failure: Secondary | ICD-10-CM

## 2018-03-09 DIAGNOSIS — K559 Vascular disorder of intestine, unspecified: Secondary | ICD-10-CM

## 2018-03-09 DIAGNOSIS — K8020 Calculus of gallbladder without cholecystitis without obstruction: Secondary | ICD-10-CM | POA: Diagnosis not present

## 2018-03-09 DIAGNOSIS — I480 Paroxysmal atrial fibrillation: Secondary | ICD-10-CM | POA: Diagnosis not present

## 2018-03-09 MED ORDER — ZOSTER VAC RECOMB ADJUVANTED 50 MCG/0.5ML IM SUSR: 0.5000 mL | Freq: Once | INTRAMUSCULAR | 1 refills | Status: AC

## 2018-03-09 NOTE — Telephone Encounter (Signed)
Called Jenny Reichmann back and she wanted to know if her dad could be seen on Thursday 03/15/2018 instead of 03/22/2018. I told her that I would schedule her dad to be seen on 03/15/2018 at 3:00 PM. I told Jenny Reichmann that if Dr. Hampton Abbot would not be able to see him on that day, then I would call her back. Jenny Reichmann had no further questions.

## 2018-03-09 NOTE — Patient Instructions (Signed)
Eric [name] , Thank you for taking time to come for your Medicare Wellness Visit. I appreciate your ongoing commitment to your health goals. Please review the following plan we discussed and let me know if I can assist you in the future.   Screening recommendations/referrals: Colonoscopy excluded, you are over age 82 Recommended yearly ophthalmology/optometry visit for glaucoma screening and checkup Recommended yearly dental visit for hygiene and checkup  Vaccinations: Influenza vaccine up to date, due 2019 fall season Pneumococcal vaccine up to date, completed Tdap vaccine up to date, due 10/15/2023 Shingles vaccine due, prescription sent to pharmacy    Advanced directives: Please bring [place] a copy of your living will and health care power of attorney  Conditions/risks identified: none  Next appointment: Eric Dense, RN 03/15/2019 @ 10am  Preventive Care 65 Years and Older, Male Preventive care refers to lifestyle choices and visits with your health care provider that can promote health and wellness. What does preventive care include?  A yearly physical exam. This is also called an annual well check.  Dental exams once or twice a year.  Routine eye exams. Eric [name].  Personal lifestyle choices, including:  Daily care of your teeth and gums.  Regular physical activity.  Eating a healthy diet.  Avoiding tobacco and drug use.  Limiting alcohol use.  Practicing safe sex.  Taking low doses of aspirin every day.  Taking vitamin and mineral supplements as recommended by your health care provider. What happens during an annual well check? The services and screenings done by your health care provider during your annual well check will depend on your age, overall health, lifestyle risk factors, and family history of disease. Counseling  Your health care provider Eric [name] Eric you questions about your:  Alcohol  use.  Tobacco use.  Drug use.  Emotional well-being.  Home and relationship well-being.  Sexual activity.  Eating habits.  History of falls.  Memory and ability to understand (cognition).  Work and work Statistician. Screening  You Eric [name] have the following tests or measurements:  Height, weight, and BMI.  Blood pressure.  Lipid and cholesterol levels. These Eric [name] be [name] every 5 years, or more frequently if you are over 62 years old.  Skin check.  Lung cancer screening. You Eric [name] have this screening every year starting at age 19 if you have a 30-pack-year history of smoking and currently smoke or have quit within the past 15 years.  Fecal occult blood test (FOBT) of the stool. You Eric [name] have this test every year starting at age 60.  Flexible sigmoidoscopy or colonoscopy. You Eric [name] have a sigmoidoscopy every 5 years or a colonoscopy every 10 years starting at age 46.  Prostate cancer screening. Recommendations will vary depending on your family history and other risks.  Hepatitis C blood test.  Hepatitis B blood test.  Sexually transmitted disease (STD) testing.  Diabetes screening. This is done by checking your blood sugar (glucose) after you have not eaten for a while (fasting). You Eric [name] have this done every 1-3 years.  Abdominal aortic aneurysm (AAA) screening. You Eric [name] need this if you are a current or former smoker.  Osteoporosis. You Eric [name] be screened starting at age 5 if you are at high risk. Talk with your health care provider about your test results, treatment options, and if necessary, the need for more tests. Vaccines  Your health care provider Eric [name] recommend certain vaccines, such as:  Influenza vaccine.  This is recommended every year.  Tetanus, diphtheria, and acellular pertussis (Tdap, Td) vaccine. You [place] need a Td booster every 10 years.  Zoster vaccine. You [place] need this after age 50.  Pneumococcal 13-valent conjugate (PCV13) vaccine. One dose is  recommended after age 79.  Pneumococcal polysaccharide (PPSV23) vaccine. One dose is recommended after age 75. Talk to your health care provider about which screenings and vaccines you need and how often you need them. This information is not intended to replace advice given to you by your health care provider. Make sure you discuss any questions you have with your health care provider. Document Released: 12/25/2015 Document Revised: 08/17/2016 Document Reviewed: 09/29/2015 Elsevier Interactive Patient Education  2017 [employer] Prevention in the Home Falls can cause injuries. They can happen to people of all ages. There are many things you can do to make your home safe and to help prevent falls. What can I do on the outside of my home?  Regularly fix the edges of walkways and driveways and fix any cracks.  Remove anything that might make you trip as you walk through a door, such as a raised step or threshold.  Trim any bushes or trees on the path to your home.  Use bright outdoor lighting.  Clear any walking paths of anything that might make someone trip, such as rocks or tools.  Regularly check to see if handrails are loose or broken. Make sure that both sides of any steps have handrails.  Any raised decks and porches should have guardrails on the edges.  Have any leaves, snow, or ice cleared regularly.  Use sand or salt on walking paths during winter.  Clean up any spills in your garage right away. This includes oil or grease spills. What can I do in the bathroom?  Use night lights.  Install grab bars by the toilet and in the tub and shower. Do not use towel bars as grab bars.  Use non-skid mats or decals in the tub or shower.  If you need to sit down in the shower, use a plastic, non-slip stool.  Keep the floor dry. Clean up any water that spills on the floor as soon as it happens.  Remove soap buildup in the tub or shower regularly.  Attach bath mats  securely with double-sided non-slip rug tape.  Do not have throw rugs and other things on the floor that can make you trip. What can I do in the bedroom?  Use night lights.  Make sure that you have a light by your bed that is easy to reach.  Do not use any sheets or blankets that are too big for your bed. They should not hang down onto the floor.  Have a firm chair that has side arms. You can use this for support while you get dressed.  Do not have throw rugs and other things on the floor that can make you trip. What can I do in the kitchen?  Clean up any spills right away.  Avoid walking on wet floors.  Keep items that you use a lot in easy-to-reach places.  If you need to reach something above you, use a strong step stool that has a grab bar.  Keep electrical cords out of the way.  Do not use floor polish or wax that makes floors slippery. If you must use wax, use non-skid floor wax.  Do not have throw rugs and other things on the floor that can make  you trip. What can I do with my stairs?  Do not leave any items on the stairs.  Make sure that there are handrails on both sides of the stairs and use them. Fix handrails that are broken or loose. Make sure that handrails are as long as the stairways.  Check any carpeting to make sure that it is firmly attached to the stairs. Fix any carpet that is loose or worn.  Avoid having throw rugs at the top or bottom of the stairs. If you do have throw rugs, attach them to the floor with carpet tape.  Make sure that you have a light switch at the top of the stairs and the bottom of the stairs. If you do not have them, Eric someone to add them for you. What else can I do to help prevent falls?  Wear shoes that:  Do not have high heels.  Have rubber bottoms.  Are comfortable and fit you well.  Are closed at the toe. Do not wear sandals.  If you use a stepladder:  Make sure that it is fully opened. Do not climb a closed  stepladder.  Make sure that both sides of the stepladder are locked into place.  Eric someone to hold it for you, if possible.  Clearly mark and make sure that you can see:  Any grab bars or handrails.  First and last steps.  Where the edge of each step is.  Use tools that help you move around (mobility aids) if they are needed. These include:  Canes.  Walkers.  Scooters.  Crutches.  Turn on the lights when you go into a dark area. Replace any light bulbs as soon as they burn out.  Set up your furniture so you have a clear path. Avoid moving your furniture around.  If any of your floors are uneven, fix them.  If there are any pets around you, be aware of where they are.  Review your medicines with your doctor. Some medicines can make you feel dizzy. This can increase your chance of falling. Eric your doctor what other things that you can do to help prevent falls. This information is not intended to replace advice given to you by your health care provider. Make sure you discuss any questions you have with your health care provider. Document Released: 09/24/2009 Document Revised: 05/05/2016 Document Reviewed: 01/02/2015 Elsevier Interactive Patient Education  2017 [employer].

## 2018-03-09 NOTE — Progress Notes (Signed)
Subjective:   Eric [name] is a 84 y.o. male who presents for Medicare Annual/Subsequent preventive examination.  Last AWV-05/16/2016    Objective:    Vitals: BP 120/72 (BP Location: Left Arm, Patient Position: Sitting)   Pulse (!) 54   Temp 98 F (36.7 C) (Oral)   Ht 5\' 7"  (1.702 m)   Wt 177 lb (80.3 kg)   SpO2 95%   BMI 27.72 kg/m   Body mass index is 27.72 kg/m.  Advanced Directives 03/09/2018 02/18/2018 02/18/2018 11/12/2017 11/01/2017 10/19/2017 06/20/2017  Does Patient Have a Medical Advance Directive? Yes Yes No Yes Yes No No  Type of Advance Directive Living will Living will - Living will [place];Living will - -  Does patient want to make changes to medical advance directive? No - Patient declined No - Patient declined - - No - Patient declined - -  Copy of [place] in Chart? - - - No - copy requested No - copy requested - -  Would patient like information on creating a medical advance directive? - No - Patient declined No - Patient declined - - - No - Patient declined    Tobacco Social History   Tobacco Use  Smoking Status Former Smoker  . Years: 1.00  Smokeless Tobacco Never Used  Tobacco Comment   stopped in 20's     Counseling given: Not Answered Comment: stopped in 20's   Clinical Intake:  Pre-visit preparation completed: No  Pain : 0-10 Pain Score: 5  Pain Type: Acute pain Pain Location: Abdomen Pain Orientation: Right Pain Descriptors / Indicators: Aching Pain Onset: More than a month ago Pain Frequency: Constant     Nutritional Risks: None  How often do you need to have someone help you when you read instructions, pamphlets, or other written materials from your doctor or pharmacy?: 1 - Never What is the last grade level you completed in school?: HS  Interpreter Needed?: No  Information entered by :: [name], RN  Past Medical History:  Diagnosis Date  . Anxiety   . Arthritis   . Atrial  fibrillation ([place]) 05/28/2007    Qualifier: Diagnosis of  By: Council Mechanic MD, Hilaria Ota    . Atypical chest pain    a. 05/2017 MV: no ischemia, EF 79%.  . Chest pain 10/20/2017  . Chronic diastolic CHF (congestive heart failure) ([place])    a. 01/2011 Echo: EF 50-55%, gr1 DD, mild AI, nl RV fxn, mild TR/PR; b. 10/2017 Echo: EF 60-65%, mild LVH, gr2 DD.  Marland Kitchen DDD (degenerative disc disease), cervical   . Depressive disorder, not elsewhere classified   . Difficult intubation   . Dysphagia, oral phase   . Dyspnea   . Edema   . History of DVT (deep vein thrombosis)   . History of kidney stones   . Hypertension   . HYPERTENSION, BENIGN ESSENTIAL 05/28/2007   Qualifier: Diagnosis of  By: Council Mechanic MD, Hilaria Ota   . Hypoxemia   . Impacted cerumen   . Long term current use of anticoagulant 03/02/2011  . LOW BACK PAIN SYNDROME 03/17/2009   Qualifier: Diagnosis of  By: Council Mechanic MD, Hilaria Ota   . Mixed hyperlipidemia   . 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 Other myelopathy   . Pain in limb   . Palpitations   . Paroxysmal Atrial Fibrillation ([place])    a. a. 01/2011 in setting of post-op complications including aspiration  pna;  b. CHA2DS2VASc = 4--> Amio/Eliquis.  . Pneumonia 03/06/2003  . Spinal stenosis, unspecified region other than cervical   . 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 Syncope    a. 10/2017-->Event monitor: RSR, rare PACs/PVCs.  . Syncope and collapse 10/20/2017  . Thoracic or lumbosacral neuritis or radiculitis, unspecified    Past Surgical History:  Procedure Laterality Date  . CARDIOVERSION N/A 06/20/2017   Procedure: CARDIOVERSION;  Surgeon: [name], MD;  Location: [facility] ORS;  Service: Cardiovascular;  Laterality: N/A;  . CATARACT EXTRACTION W/ INTRAOCULAR LENS  IMPLANT, BILATERAL    . CERVICAL FUSION  02/10/2011  . COLONOSCOPY WITH PROPOFOL N/A 02/20/2018   Procedure: COLONOSCOPY WITH PROPOFOL;   Surgeon: Lucilla Lame, MD;  Location[facility] ENDOSCOPY;  Service: Endoscopy;  Laterality: N/A;  . history of abd ultrasound  11/01   fatty liver  . MULTIPLE TOOTH EXTRACTIONS    . ORIF FIBULA FRACTURE Left 01/06/2017   Procedure: OPEN REDUCTION INTERNAL FIXATION (ORIF) FIBULA FRACTURE DISTAL FIBULA;  Surgeon: Melrose Nakayama, MD;  Location: [place];  Service: Orthopedics;  Laterality: Left;  Patient states has problems if he will have a tube in throat for Genera; Anesthesia   Family History  Problem Relation Age of Onset  . Stroke Father   . Atrial fibrillation Brother        on coumadin  . Heart disease Brother        AFib- coumadin  . Stroke Paternal Grandfather   . Cancer Other        colon cancer at early age  . Prostate cancer Neg Hx   . Kidney cancer Neg Hx   . Bladder Cancer Neg Hx    Social History   Socioeconomic History  . Marital status: Married    Spouse name: Not on file  . Number of children: 2  . Years of education: Not on file  . Highest education level: Not on file  Occupational History  . 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 Financial resource strain: Not hard at all  . Food insecurity:    Worry: Never true    Inability: Never true  . Transportation needs:    Medical: No    Non-medical: No  Tobacco Use  . Smoking status: Former Smoker    Years: 1.00  . Smokeless tobacco: Never Used  . Tobacco comment: stopped in 20's  Substance and Sexual Activity  . Alcohol use: No  . Drug use: No  . Sexual activity: Not Currently  Lifestyle  . Physical activity:    Days per week: 7 days    Minutes per session: 10 min  . Stress: Not at all  Relationships  . Social connections:    Talks on phone: More than three times a week    Gets together: More than three times a week    Attends religious service: More than 4 times per year    Active member of club or organization: No    Attends  meetings of clubs or organizations: Never    Relationship status: Married  Other Topics Concern  . Not on file  Social History Narrative   Regular exercise- yes- therapy    Outpatient Encounter Medications as of 03/09/2018  Medication Sig  . amiodarone (PACERONE) 200 MG tablet Take 1 tablet (200 mg total) by  mouth daily.  Marland Kitchen apixaban (ELIQUIS) 5 MG TABS tablet Take 1 tablet (5 mg total) by mouth 2 (two) times daily.  . cetirizine (ZYRTEC) 10 MG tablet Take 1 tablet (10 mg total) by mouth daily.  . Cholecalciferol (VITAMIN D) 1000 UNITS capsule Take one tablet once daily  . fluticasone (FLONASE) 50 MCG/ACT nasal spray Place 2 sprays daily into both nostrils.  . furosemide (LASIX) 40 MG tablet Take 40 mg 2 (two) times daily by mouth.  . gabapentin (NEURONTIN) 300 MG capsule Take 1 capsule (300 mg total) by mouth 2 (two) times daily.  . metoprolol tartrate (LOPRESSOR) 25 MG tablet Take 1 tablet (25 mg total) 2 (two) times daily by mouth.  . pantoprazole (PROTONIX) 40 MG tablet Take 1 tablet (40 mg total) by mouth at bedtime.  . potassium chloride SA (K-DUR,KLOR-CON) 20 MEQ tablet Take 0.5 tablets (10 mEq total) 2 (two) times daily by mouth.  . pravastatin (PRAVACHOL) 80 MG tablet Take 1 tablet (80 mg total) daily by mouth.  . traMADol (ULTRAM) 50 MG tablet Take 50 mg by mouth at bedtime as needed for pain.  Marland Kitchen Zoster Vaccine Adjuvanted [facility]) injection Inject 0.5 mLs into the muscle once for 1 dose.  . [DISCONTINUED] Zoster Vaccine Adjuvanted [facility]) injection Inject 0.5 mLs into the muscle once.   No facility-administered encounter medications on file as of 03/09/2018.     Activities of Daily Living In your present state of health, do you have any difficulty performing the following activities: 03/09/2018 02/18/2018  Hearing? Y N  Vision? N N  Difficulty concentrating or making decisions? N N  Walking or climbing stairs? Y Y  Dressing or bathing? N N  Doing errands, shopping? Tempie Donning    Preparing Food and eating ? N -  Using the Toilet? N -  In the past six months, have you accidently leaked urine? N -  Do you have problems with loss of bowel control? N -  Managing your Medications? N -  Managing your Finances? N -  Housekeeping or managing your Housekeeping? N -  Some recent data might be hidden    Patient Care Team: [name], DO as PCP - General (Geriatric Medicine) [name], MD as PCP - Cardiology (Cardiology) [name], MD as Consulting Physician (Cardiology) Normajean Glasgow, MD as Attending Physician (Physical Medicine and Rehabilitation) Phylliss Bob, MD as Consulting Physician (Orthopedic Surgery)   Assessment:   This is a routine wellness examination for Eric [name].  Exercise Activities and Dietary recommendations Current Exercise Habits: Home exercise routine, Type of exercise: stretching;walking, Time (Minutes): 10, Frequency (Times/Week): 7, Weekly Exercise (Minutes/Week): 70, Intensity: Mild, Exercise limited by: None identified  Goals    None      Fall Risk Fall Risk  03/09/2018 12/21/2017 11/16/2017 11/01/2017 10/27/2017  Falls in the past year? Yes No Yes Yes Yes  Number falls in past yr: 2 or more - 2 or more 2 or more 2 or more  Comment - - - - Pt denies 2nd fall 2 days ago  Injury with Fall? No - Yes No No   Is the patient's home free of loose throw rugs in walkways, pet beds, electrical cords, etc?   yes      Grab bars in the bathroom? yes      Handrails on the stairs?   yes      Adequate lighting?   yes  Timed Get Up and Go Performed: 30 seconds, fall risk  Depression Screen PHQ 2/9 Scores 03/09/2018 12/21/2017 10/27/2017 05/25/2017  PHQ - 2 Score 0 0 0 0  Exception Documentation - - - -    Cognitive Function MMSE - Mini Mental State Exam 03/09/2018 05/16/2016  Orientation to time 5 5  Orientation to Place 5 5  Registration 3 3  Attention/ Calculation 5 5  Recall 2 3  Language- name 2 objects 2 2  Language- repeat 1 1   Language- follow 3 step command 3 3  Language- read & follow direction 1 1  Write a sentence 1 1  Copy design 0 1  Total score 28 30        Immunization History  Administered Date(s) Administered  . Influenza Whole 09/11/1997, 10/11/2007, 10/06/2010  . Influenza, High Dose Seasonal PF 09/01/2017  . Influenza,inj,Quad PF,6+ Mos 10/03/2013, 09/15/2014, 09/04/2015  . Pneumococcal Conjugate-13 01/19/2015  . Pneumococcal Polysaccharide-23 03/05/2002  . Td 01/19/2003  . Tdap 10/14/2013  . Zoster 10/14/2013    Qualifies for Shingles Vaccine? Due, prescription sent to pharmacy  Screening Tests Health Maintenance  Topic Date Due  . COLONOSCOPY  02/20/2021  . TETANUS/TDAP  10/15/2023  . INFLUENZA VACCINE  Completed  . PNA vac Low Risk Adult  Completed   Cancer Screenings: Lung: Low Dose CT Chest recommended if Age 33-80 years, 30 pack-year currently smoking OR have quit w/in 15years. Patient does not qualify. Colorectal: up to date  Additional Screenings:  Hepatitis C Screening: declined    Plan:    I have personally reviewed and addressed the Medicare Annual Wellness questionnaire and have noted the following in the patient's chart:  A. Medical and social history B. Use of alcohol, tobacco or illicit drugs  C. Current medications and supplements D. Functional ability and status E.  Nutritional status F.  Physical activity G. Advance directives H. List of other physicians I.  Hospitalizations, surgeries, and ER visits in previous 12 months J.  [place] to include hearing, vision, cognitive, depression L. Referrals and appointments - none  In addition, I have reviewed and discussed with patient certain preventive protocols, quality metrics, and best practice recommendations. A written personalized care plan for preventive services as well as general preventive health recommendations were provided to patient.  See attached scanned questionnaire for additional  information.   Signed,   [name], RN Nurse Health Advisor  Patient concerns: None

## 2018-03-09 NOTE — Telephone Encounter (Signed)
Patient daughter wants to see if patient really needs fu on 4-4 with Gollan   Please call to advise

## 2018-03-09 NOTE — Telephone Encounter (Signed)
Patients daughter is calling said her father is in some kind pain, pain level being a 5, but sometimes has no pain, but is tender, Patient is wondering if something could possibly be called into the pharmacy or come in sooner and see Dr. Hampton Abbot. Please call patient and advise.

## 2018-03-09 NOTE — Progress Notes (Signed)
Careteam: Patient Care Team: [name], DO as PCP - General (Geriatric Medicine) [name], MD as PCP - Cardiology (Cardiology) [name], MD as Consulting Physician (Cardiology) Normajean Glasgow, MD as Attending Physician (Physical Medicine and Rehabilitation) Phylliss Bob, MD as Consulting Physician (Orthopedic Surgery)  Advanced Directive information    Allergies  Allergen Reactions  . Morphine And Related Shortness Of Breath  . Percocet [Oxycodone-Acetaminophen] Shortness Of Breath  . Valium Shortness Of Breath    Chief Complaint  Patient presents with  . Hospitalization Follow-up    Follow-up from [facility] 02/18/18-02/21/18, GI Bleed      HPI: Patient is a 84 y.o. male seen in the office today to follow up hospital follow up. Pt with hx with PAF, chronic diastolic heart failure and essential hypertension who went to the ED with bright red blood per rectum. Patient had evaluation by GI. He underwent colonoscopy which shows ischemic colitis. His hemoglobin remained stable. He was discharged on 02/21/18 and instructed to follow up with GI for results of biopsies that were taken. Phone call in epic reports biopsy confirmed ischemic colitis. There has been no further bleeding noted. Doing well.  Has been having more trouble with gallbladder despite dietary modifications, has already been cleared for lap chole by cardiology. Despite ischemic coltitis there was no exacerbation in CHF  Was very unhappy with hospital stay at [facility].  Does not want any specialist in [place] as well.    Review of Systems:  Review of Systems  Constitutional: Negative for chills, fever and malaise/fatigue.  HENT: Positive for hearing loss. Negative for sinus pain and sore throat.   Eyes: Negative.   Respiratory: Negative for cough and shortness of breath.   Cardiovascular: Positive for leg swelling (improved).  Gastrointestinal: Positive for  abdominal pain. Negative for blood in stool, constipation, diarrhea, heartburn, nausea and vomiting.  Genitourinary: Negative for dysuria, frequency and urgency.  Musculoskeletal: Negative.   Skin: Negative.   Neurological: Negative.   Psychiatric/Behavioral: Negative.     Past Medical History:  Diagnosis Date  . Anxiety   . Arthritis   . Atrial fibrillation ([place]) 05/28/2007    Qualifier: Diagnosis of  By: Council Mechanic MD, Hilaria Ota    . Atypical chest pain    a. 05/2017 MV: no ischemia, EF 79%.  . Chest pain 10/20/2017  . Chronic diastolic CHF (congestive heart failure) ([place])    a. 01/2011 Echo: EF 50-55%, gr1 DD, mild AI, nl RV fxn, mild TR/PR; b. 10/2017 Echo: EF 60-65%, mild LVH, gr2 DD.  Marland Kitchen DDD (degenerative disc disease), cervical   . Depressive disorder, not elsewhere classified   . Difficult intubation   . Dysphagia, oral phase   . Dyspnea   . Edema   . History of DVT (deep vein thrombosis)   . History of kidney stones   . Hypertension   . HYPERTENSION, BENIGN ESSENTIAL 05/28/2007   Qualifier: Diagnosis of  By: Council Mechanic MD, Hilaria Ota   . Hypoxemia   . Impacted cerumen   . Long term current use of anticoagulant 03/02/2011  . LOW BACK PAIN SYNDROME 03/17/2009   Qualifier: Diagnosis of  By: Council Mechanic MD, Hilaria Ota   . Mixed hyperlipidemia   . 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 Other myelopathy   . Pain in limb   . Palpitations   . Paroxysmal Atrial Fibrillation ([place])    a. a. 01/2011 in setting of  post-op complications including aspiration pna;  b. CHA2DS2VASc = 4--> Amio/Eliquis.  . Pneumonia 03/06/2003  . Spinal stenosis, unspecified region other than cervical   . 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 Syncope    a. 10/2017-->Event monitor: RSR, rare PACs/PVCs.  . Syncope and collapse 10/20/2017  . Thoracic or lumbosacral neuritis or radiculitis, unspecified    Past Surgical History:  Procedure  Laterality Date  . CARDIOVERSION N/A 06/20/2017   Procedure: CARDIOVERSION;  Surgeon: [name], MD;  Location: [facility] ORS;  Service: Cardiovascular;  Laterality: N/A;  . CATARACT EXTRACTION W/ INTRAOCULAR LENS  IMPLANT, BILATERAL    . CERVICAL FUSION  02/10/2011  . COLONOSCOPY WITH PROPOFOL N/A 02/20/2018   Procedure: COLONOSCOPY WITH PROPOFOL;  Surgeon: Lucilla Lame, MD;  Location: [facility] ENDOSCOPY;  Service: Endoscopy;  Laterality: N/A;  . history of abd ultrasound  11/01   fatty liver  . MULTIPLE TOOTH EXTRACTIONS    . ORIF FIBULA FRACTURE Left 01/06/2017   Procedure: OPEN REDUCTION INTERNAL FIXATION (ORIF) FIBULA FRACTURE DISTAL FIBULA;  Surgeon: Melrose Nakayama, MD;  Location: [facility];  Service: Orthopedics;  Laterality: Left;  Patient states has problems if he will have a tube in throat for Genera; Anesthesia   Social History:   reports that he has quit smoking. He quit after 1.00 year of use. He has never used smokeless tobacco. He reports that he does not drink alcohol or use drugs.  Family History  Problem Relation Age of Onset  . Stroke Father   . Atrial fibrillation Brother        on coumadin  . Heart disease Brother        AFib- coumadin  . Stroke Paternal Grandfather   . Cancer Other        colon cancer at early age  . Prostate cancer Neg Hx   . Kidney cancer Neg Hx   . Bladder Cancer Neg Hx     Medications: Patient's Medications  New Prescriptions   No medications on file  Previous Medications   AMIODARONE (PACERONE) 200 MG TABLET    Take 1 tablet (200 mg total) by mouth daily.   APIXABAN (ELIQUIS) 5 MG TABS TABLET    Take 1 tablet (5 mg total) by mouth 2 (two) times daily.   CETIRIZINE (ZYRTEC) 10 MG TABLET    Take 1 tablet (10 mg total) by mouth daily.   CHOLECALCIFEROL (VITAMIN D) 1000 UNITS CAPSULE    Take one tablet once daily   FLUTICASONE (FLONASE) 50 MCG/ACT NASAL SPRAY    Place 2 sprays daily into both nostrils.   FUROSEMIDE (LASIX) 40 MG TABLET    Take 40  mg 2 (two) times daily by mouth.   GABAPENTIN (NEURONTIN) 300 MG CAPSULE    Take 1 capsule (300 mg total) by mouth 2 (two) times daily.   METOPROLOL TARTRATE (LOPRESSOR) 25 MG TABLET    Take 1 tablet (25 mg total) 2 (two) times daily by mouth.   PANTOPRAZOLE (PROTONIX) 40 MG TABLET    Take 1 tablet (40 mg total) by mouth at bedtime.   POTASSIUM CHLORIDE SA (K-DUR,KLOR-CON) 20 MEQ TABLET    Take 0.5 tablets (10 mEq total) 2 (two) times daily by mouth.   PRAVASTATIN (PRAVACHOL) 80 MG TABLET    Take 1 tablet (80 mg total) daily by mouth.   TRAMADOL (ULTRAM) 50 MG TABLET    Take 50 mg by mouth at bedtime  as needed for pain.  Modified Medications   Modified Medication Previous Medication   ZOSTER VACCINE ADJUVANTED [facility]) INJECTION Zoster Vaccine Adjuvanted [facility]) injection      Inject 0.5 mLs into the muscle once for 1 dose.    Inject 0.5 mLs into the muscle once.  Discontinued Medications   No medications on file     Physical Exam:  Vitals:   03/09/18 1022  BP: 120/72  Pulse: (!) 54  Temp: 98 F (36.7 C)  TempSrc: Oral  SpO2: 95%  Weight: 177 lb (80.3 kg)  Height: 5\' 7"  (1.702 m)   Body mass index is 27.72 kg/m.  Physical Exam  Constitutional: He is oriented to person, place, and time. He appears well-developed and well-nourished.  HENT:  Right Ear: External ear normal.  Left Ear: External ear normal.  HOH  Eyes:  glasses  Neck: Normal range of motion. No JVD present.  Cardiovascular: Normal rate, regular rhythm and normal heart sounds.  Pulmonary/Chest: Effort normal and breath sounds normal. He has no rales.  Abdominal: Soft. Bowel sounds are normal. He exhibits no distension and no mass. There is tenderness (mild RUQ). There is no rebound and no guarding. No hernia.  Musculoskeletal: He exhibits edema (trace bilaterally).  Uses rolling walker  Neurological: He is alert and oriented to person, place, and time.  Skin: Skin is warm and dry.  Psychiatric: He has a  normal mood and affect.   Labs reviewed: Basic Metabolic Panel: Recent Labs    03/29/17 1503  01/08/18 1152 02/18/18 1601 02/19/18 0415  NA 140   < > 139 142 140  K 4.6   < > 4.3 4.1 3.6  CL 104   < > 104 108 109  CO2 21   < > 27 27 22   GLUCOSE 80   < > 85 108* 86  BUN 27   < > 22* 24* 20  CREATININE 1.02   < > 1.25* 1.28* 1.23  CALCIUM 9.5   < > 8.8* 8.9 8.1*  TSH 3.340  --   --   --   --    < > = values in this interval not displayed.   Liver Function Tests: Recent Labs    11/12/17 2025 01/08/18 1152 02/18/18 1601  AST 47* 56* 46*  ALT 46 62 44  ALKPHOS 55 43 45  BILITOT 0.6 0.8 0.8  PROT 7.7 7.3 7.1  ALBUMIN 4.0 4.1 4.2   Recent Labs    11/12/17 2025 01/08/18 1152 02/18/18 1725  LIPASE 38  --  45  AMYLASE  --  83  --    No results for input(s): AMMONIA in the last 8760 hours. CBC: Recent Labs    06/16/17 0909  10/20/17 1410 10/27/17 1055  02/18/18 1601  02/19/18 0415  02/20/18 0424  02/21/18 0419 02/21/18 1008 02/21/18 1547  WBC 6.1   < > 5.5 5.3   < > 8.4  --  6.5  --  6.1  --   --   --   --   NEUTROABS 3.1  --  3.0 2,894  --   --   --   --   --   --   --   --   --   --   HGB 15.1   < > 13.5 13.0*   < > 14.2   < > 13.4   < > 13.4   < > 13.6 13.9 14.3  HCT 44.2   < >  41.2 37.8*   < > 42.3  --  39.9*  --  40.0  --   --   --   --   MCV 94.5   < > 98.8 95.2   < > 97.8  --  96.2  --  97.1  --   --   --   --   PLT 205   < > 186 200   < > 192  --  161  --  148*  --   --   --   --    < > = values in this interval not displayed.   Lipid Panel: Recent Labs    09/01/17 1015  CHOL 187  HDL 34*  LDLCALC 113*  TRIG 303*  CHOLHDL 5.5*   TSH: Recent Labs    03/29/17 1503  TSH 3.340   A1C: Lab Results  Component Value Date   HGBA1C 5.7 (H) 05/16/2016     Assessment/Plan 1. Ischemic colitis ([place]) Stable, follow up with GI scheduled, no further bleeding noted  2. Chronic diastolic CHF (congestive heart failure) (H[facility]) Euvolemic, compensated,  doing well at this time.   3. HYPERTENSION, BENIGN ESSENTIAL Controlled on metoprolol  4. Paroxysmal atrial fibrillation (H[facility]) -rate controlled, continues on eliquis and metoprolol   5. Weakness Has improved, has done well with PT, without significant weakness or debility from recent hospital stay.  6. Gallstones Ongoing RUQ pain due to gallstone, plan for lap cholecystectomy which has been scheduled, daughter and pt reports they would like to see if they can move this up date up due to progressive worsening of pain.   Next appt: 04/26/2018 Carlos American. [place], [facility] [phone]

## 2018-03-09 NOTE — Telephone Encounter (Signed)
To Dr. Rockey Situ to review-  The patient was recently hospitalized with a GI bleed/ Ischemic colitis.  Discharged on 02/21/18. He recently saw Ignacia Bayley, NP in 12/2017.

## 2018-03-11 NOTE — Telephone Encounter (Signed)
It is up to family if they feel he needs to be seen D/c summary from hospital 2 weeks ago said for him to see GI in 2 weeks

## 2018-03-12 NOTE — Telephone Encounter (Signed)
Spoke with patients daughter per release form. She states that the patient does not need to be seen by Dr. Rockey Situ and that they saw him in the hospital on the last day. Advised that I would cancel the upcoming appointment and to let us know if they have any further questions. She verbalized understanding and had no other concerns.

## 2018-03-13 ENCOUNTER — Ambulatory Visit: Payer: PPO | Admitting: Gastroenterology

## 2018-03-15 ENCOUNTER — Encounter: Payer: Self-pay | Admitting: Gastroenterology

## 2018-03-15 ENCOUNTER — Ambulatory Visit (INDEPENDENT_AMBULATORY_CARE_PROVIDER_SITE_OTHER): Payer: PPO | Admitting: Gastroenterology

## 2018-03-15 ENCOUNTER — Ambulatory Visit: Payer: PPO | Admitting: Cardiovascular Disease

## 2018-03-15 ENCOUNTER — Other Ambulatory Visit: Payer: Self-pay

## 2018-03-15 ENCOUNTER — Ambulatory Visit: Payer: Self-pay | Admitting: Surgery

## 2018-03-15 ENCOUNTER — Ambulatory Visit (INDEPENDENT_AMBULATORY_CARE_PROVIDER_SITE_OTHER): Payer: PPO | Admitting: Surgery

## 2018-03-15 ENCOUNTER — Encounter: Payer: Self-pay | Admitting: Surgery

## 2018-03-15 VITALS — BP 144/78 | HR 53 | Ht 66.0 in | Wt 176.4 lb

## 2018-03-15 VITALS — BP 118/66 | HR 55 | Temp 97.9°F | Ht 66.0 in | Wt 178.4 lb

## 2018-03-15 DIAGNOSIS — R1011 Right upper quadrant pain: Secondary | ICD-10-CM

## 2018-03-15 DIAGNOSIS — K559 Vascular disorder of intestine, unspecified: Secondary | ICD-10-CM | POA: Diagnosis not present

## 2018-03-15 DIAGNOSIS — K5909 Other constipation: Secondary | ICD-10-CM

## 2018-03-15 DIAGNOSIS — G8929 Other chronic pain: Secondary | ICD-10-CM

## 2018-03-15 MED ORDER — URSODIOL 300 MG PO CAPS: 300.0000 mg | ORAL_CAPSULE | Freq: Two times a day (BID) | ORAL | 2 refills | Status: DC

## 2018-03-15 NOTE — Progress Notes (Signed)
Eric Antigua, MD [address]  [place]  [place], [place] [address]  Main: [phone]  Fax: [phone]   Primary Care Physician: [name], DO  Primary Gastroenterologist:  Dr. Vonda [name]  Chief Complaint  Patient presents with  . Follow-up    Ishemic Colitis, Biliary Colic    HPI: Eric [name] is a 84 y.o. male here for posthospitalization follow-up.  Patient was in the hospital on February 19, 2018, due to bright L blood per rectum, 1-2 episodes only.  This self resolved even while in the hospital.  He underwent a colonoscopy by Dr. Allen Norris on February 20, 2018, extent of exam sigmoid colon, sigmoid ulcerations, biopsies consistent with ischemic colitis.  patient has not had any further bright red blood per rectum, even before discharge from the hospital.  Reporting soft brown bowel movements every day.  No loose stools.    His right upper quadrant pain has not improved with ursodiol, and MiraLAX.  States pain occurs intermittently, every day or every other day, cramping, 5/10, with no radiation.  He saw surgery, Dr. Hampton Abbot, and then noted low gallbladder EF of 22% consistent with biliary dyskinesia.  He is seeing them again today to discuss symptoms    Current Outpatient Medications  Medication Sig Dispense Refill  . amiodarone (PACERONE) 200 MG tablet Take 1 tablet (200 mg total) by mouth daily. 30 tablet 3  . apixaban (ELIQUIS) 5 MG TABS tablet Take 1 tablet (5 mg total) by mouth 2 (two) times daily. 60 tablet 6  . cetirizine (ZYRTEC) 10 MG tablet Take 1 tablet (10 mg total) by mouth daily. 90 tablet 1  . Cholecalciferol (VITAMIN D) 1000 UNITS capsule Take one tablet once daily 90 capsule 3  . fluticasone (FLONASE) 50 MCG/ACT nasal spray Place 2 sprays daily into both nostrils. 16 g 10  . furosemide (LASIX) 40 MG tablet Take 40 mg 2 (two) times daily by mouth.    . gabapentin (NEURONTIN) 300 MG capsule Take 1 capsule (300 mg total) by mouth 2 (two) times  daily. 180 capsule 1  . metoprolol tartrate (LOPRESSOR) 25 MG tablet Take 1 tablet (25 mg total) 2 (two) times daily by mouth. 60 tablet 11  . pantoprazole (PROTONIX) 40 MG tablet Take 1 tablet (40 mg total) by mouth at bedtime. 30 tablet 3  . potassium chloride SA (K-DUR,KLOR-CON) 20 MEQ tablet Take 0.5 tablets (10 mEq total) 2 (two) times daily by mouth. 90 tablet 3  . pravastatin (PRAVACHOL) 80 MG tablet Take 1 tablet (80 mg total) daily by mouth. 90 tablet 1  . traMADol (ULTRAM) 50 MG tablet Take 50 mg by mouth at bedtime as needed for pain.    . ursodiol (ACTIGALL) 300 MG capsule Take 1 capsule (300 mg total) by mouth 2 (two) times daily. 60 capsule 2   No current facility-administered medications for this visit.     Allergies as of 03/15/2018 - Review Complete 03/15/2018  Allergen Reaction Noted  . Morphine and related Shortness Of Breath 04/15/2011  . Percocet [oxycodone-acetaminophen] Shortness Of Breath 04/15/2011  . Valium Shortness Of Breath 04/15/2011    ROS:  General: Negative for anorexia, weight loss, fever, chills, fatigue, weakness. ENT: Negative for hoarseness, difficulty swallowing , nasal congestion. CV: Negative for chest pain, angina, palpitations, dyspnea on exertion, peripheral edema.  Respiratory: Negative for dyspnea at rest, dyspnea on exertion, cough, sputum, wheezing.  GI: See history of present illness. GU:  Negative for dysuria, hematuria, urinary incontinence,  urinary frequency, nocturnal urination.  Endo: Negative for unusual weight change.    Physical Examination:   BP (!) 144/78   Pulse (!) 53   Ht 5\' 6"  (1.676 m)   Wt 176 lb 6.4 oz (80 kg)   BMI 28.47 kg/m   General: Well-nourished, well-developed in no acute distress.  Eyes: No icterus. Conjunctivae pink. Mouth: Oropharyngeal mucosa moist and pink , no lesions erythema or exudate. Neck: Supple, Trachea midline Abdomen: Bowel sounds are normal, tender to palpation right upper quadrant,  nondistended, no hepatosplenomegaly or masses, no abdominal bruits or hernia , no rebound or guarding.   Extremities: No lower extremity edema. No clubbing or deformities. Neuro: Alert and oriented x 3.  Grossly intact. Skin: Warm and dry, no jaundice.   Psych: Alert and cooperative, normal mood and affect.   Labs: CMP     Component Value Date/Time   NA 140 02/19/2018 0415   NA 142 05/18/2017 1520   K 3.6 02/19/2018 0415   CL 109 02/19/2018 0415   CO2 22 02/19/2018 0415   GLUCOSE 86 02/19/2018 0415   BUN 20 02/19/2018 0415   BUN 23 07/12/2017 1442   CREATININE 1.23 02/19/2018 0415   CREATININE 1.27 (H) 10/27/2017 1055   CALCIUM 8.1 (L) 02/19/2018 0415   PROT 7.1 02/18/2018 1601   PROT 6.5 05/16/2016 1035   ALBUMIN 4.2 02/18/2018 1601   ALBUMIN 4.0 05/16/2016 1035   AST 46 (H) 02/18/2018 1601   ALT 44 02/18/2018 1601   ALKPHOS 45 02/18/2018 1601   BILITOT 0.8 02/18/2018 1601   BILITOT 0.8 05/16/2016 1035   GFRNONAA 52 (L) 02/19/2018 0415   GFRNONAA 52 (L) 10/27/2017 1055   GFRAA >60 02/19/2018 0415   GFRAA 60 10/27/2017 1055   Lab Results  Component Value Date   WBC 6.1 02/20/2018   HGB 14.3 02/21/2018   HCT 40.0 02/20/2018   MCV 97.1 02/20/2018   PLT 148 (L) 02/20/2018    Imaging Studies: Ct Abdomen Pelvis W Contrast  Result Date: 02/18/2018 CLINICAL DATA:  Acute onset rectal bleeding EXAM: CT ABDOMEN AND PELVIS WITH CONTRAST TECHNIQUE: Multidetector CT imaging of the abdomen and pelvis was performed using the standard protocol following bolus administration of intravenous contrast. CONTRAST:  175mL ISOVUE-300 IOPAMIDOL (ISOVUE-300) INJECTION 61% COMPARISON:  11/12/2017 FINDINGS: Lower chest: Calcified left hilar lymph nodes are noted consistent with prior granulomatous disease. Hepatobiliary: Multiple gallstones are noted within the gallbladder without complicating factors. The liver shows a tiny hypodensity in the posterior aspect of the right lobe near the dome  likely representing a small cyst. This is stable from the prior exam. Pancreas: Unremarkable. No pancreatic ductal dilatation or surrounding inflammatory changes. Spleen: Multiple calcified granulomas are noted. No mass lesion is seen. Adrenals/Urinary Tract: Adrenal glands are within normal limits. Kidneys demonstrate cystic lesion is on the right. No obstructive changes are seen. No renal calculi are noted. The bladder is partially distended. Stomach/Bowel: Mild diverticular changes noted without evidence of diverticulitis. The colon is predominately decompressed although some areas of slight increased attenuation are noted in the rectosigmoid junction. This Reesman be related to prior hemorrhage consistent with the given clinical history. No definitive mass is seen. The appendix is within normal limits. No obstructive or inflammatory changes of the bowel are seen. Vascular/Lymphatic: Aortic atherosclerosis. No enlarged abdominal or pelvic lymph nodes. Reproductive: Prostate is unremarkable. Other: No abdominal wall hernia or abnormality. No abdominopelvic ascites. Musculoskeletal: Degenerative changes of the lumbar spine are noted. IMPRESSION: Slight increased  attenuation is noted within the lumen of the colon which Coyt be related to hemorrhage given the clinical history. No areas of active extravasation are noted at this time. Cholelithiasis without complicating factors. Chronic changes as described above stable from the previous exam. Electronically Signed   By: Inez Catalina M.D.   On: 02/18/2018 17:51   Nm Hepato W/eject Fract  Result Date: 02/15/2018 CLINICAL DATA:  Nausea, vomiting, and right upper quadrant abdominal pain episodes beginning in December 2018. Gallstones. EXAM: NUCLEAR MEDICINE HEPATOBILIARY IMAGING WITH GALLBLADDER EF TECHNIQUE: Sequential images of the abdomen were obtained out to 60 minutes following intravenous administration of radiopharmaceutical. After oral ingestion of Ensure,  gallbladder ejection fraction was determined. At 60 min, normal ejection fraction is greater than 33%. RADIOPHARMACEUTICALS:  5.09 mCi Tc-5m  Choletec IV COMPARISON:  Abdominal and pelvic CT scan of November 12, 2017 which demonstrated gallstones FINDINGS: Prompt uptake and biliary excretion of activity by the liver is seen. Gallbladder activity is visualized, consistent with patency of cystic duct. Biliary activity passes into small bowel, consistent with patent common bile duct. Calculated gallbladder ejection fraction is 22%%. (Normal gallbladder ejection fraction with Ensure is greater than 33%.) IMPRESSION: Abnormally low gallbladder ejection fraction consistent with chronic cholecystitis. No evidence of cystic duct or common bile duct obstruction. Electronically Signed   By: David  Martinique M.D.   On: 02/15/2018 16:31    Assessment and Plan:   Braxley Balandran Dowdle is a 84 y.o. y/o male with intermittent right upper quadrant pain, with biliary dyskinesia on HIDA scan, with minimal improvement in symptoms on ursodiol, and recent ischemic colitis  Ischemic colitis Resolved at this time No bright red blood per rectum Hemoglobin was normal throughout his hospital admission, no indication for repeat at this time, given that he has not had any further bright red blood per rectum since discharge from the hospital Asked to avoid hypotension and dehydration Asked to hydrate well, and maintain good p.o. intake.  He verbalized understanding   Biliary dyskinesia Patient is following up with surgery to discuss ongoing symptoms Ursodiol but minimal improvement in symptoms, but he still has symptoms daily He would like to continue ursodiol for now, however, is leaning toward undergoing surgery after speaking with surgery staff today Requested can be discontinued if he undergoes surgery  Constipation Resolved with MiraLAX daily Continue high-fiber diet  Dr Eric [name]

## 2018-03-15 NOTE — Patient Instructions (Signed)
F/U 3 months.   Ischemic Colitis Ischemic colitis is damage to the large intestine due to reduced blood flow (ischemia) to the colon. The colon is the last section of the large intestine, where stool is formed. The reduced blood flow Willis lead to the death of cells (necrosis) in the lining of the colon, damaging the colon and often causing bleeding. Most cases of ischemic colitis clear up in a few days with treatment. In other cases, blood flow does not improve, and parts of the colon start to die. This is extremely serious and even life-threatening. If this happens, surgery Winfield be required. In some cases, parts of the colon Hamill need to be removed. What are the causes? Ischemic colitis results from a decrease in the blood supply to the colon. Many conditions can cause this, such as:  Heart problems that reduce blood flow to the arteries that supply the colon. These include problems such as coronary heart disease, peripheral vascular disease, atrial fibrillation, and congestive heart failure.  Low blood pressure from: ? An infection that spreads to the blood (sepsis). ? Dehydration or bleeding (shock).  Drugs that narrow blood vessels (vasoconstrictors).  Sometimes the cause is not known. What increases the risk? You are more likely to develop this condition if:  You are 29 years of age or older.  You are male.  You have another medical condition, such as: ? Heart disease. ? Diabetes. ? Kidney disease that requires you to be on dialysis. ? A disease that causes blood clots.  You are frequently constipated.  You have had surgery on the heart, blood vessels (such as the aorta), or colon.  You take certain medicines or drugs, such as: ? Medicines that suppress your immune system (immunomodulators). ? Medicines that cause constipation. ? Illegal drugs, such as cocaine or methamphetamines.  You get an extreme amount of exercise from long-distance bike riding or running.  What are  the signs or symptoms? Symptoms of this condition start suddenly and Dimmitt include:  Dull pain, usually on the left side of the abdomen.  Tenderness of the abdomen.  Abdomen (abdominal) cramps.  An urgent need to have a bowel movement.  Loose, bloody stools with clots of dark or bright red blood.  Nausea and vomiting.  Fever.  Weakness, fatigue, and confusion.  How is this diagnosed? This condition Marcott be diagnosed based on:  Your symptoms, your medical history, and a physical exam.  Tests to find out more about your condition and to rule out other causes of pain and bleeding. These tests Buscemi include: ? Blood tests to check for clotting, blood loss, and low proteins in your blood. ? CT scan of the colon. ? A procedure to examine the inside of your colon using a scope that is passed through the rectum (colonoscopy). Colonoscopy is the most important diagnostic test. During this test, your health care provider Reaser take a small piece of tissue from your colon to be examined under a microscope (biopsy).  How is this treated? You Barmore be hospitalized for treatment. Treatment usually includes:  Not eating or drinking anything. This allows the colon to rest.  IV fluids to maintain blood pressure, regulate blood minerals (electrolytes), and provide nutrition.  Having a tube inserted into your stomach through your nose (nasogastric tube) to drain your stomach.  IV antibiotic medicines. These Pizzimenti be used if an infection is suspected.  Stopping or changing medicines that Negron be causing the condition.  You Brockbank need surgery if  your condition is severe or if it gets worse or does not get better after a few days. Parts of the colon that will not recover Staheli need to be removed. In some cases, a procedure is also done to attach the healthy part of the colon to the outer wall of the abdomen to drain stool (colostomy). Follow these instructions at home:  Follow instructions from your health  care provider about eating or drinking restrictions.  Drink enough fluid to keep your urine clear or pale yellow.  Take over-the-counter and prescription medicines only as told by your health care provider.  Return to your normal activities as told by your health care provider. Ask your health care provider what activities are safe for you.  Do not use any products that contain nicotine or tobacco, such as cigarettes and e-cigarettes. If you need help quitting, ask your health care provider.  Keep all follow-up visits as told by your health care provider. This is important. Contact a health care provider if:  You have blood in your stool.  You have abdominal pain or cramps.  You have constipation.  You have nausea or vomiting. Get help right away if:  You have a moderate to large amount of loose, bloody stools with clots of dark or bright red blood.  You have severe abdominal pain.  Your abdominal pain has not improved after 24 hours.  You have a fever.  You have not been able to have a bowel movement, and you are in pain and vomiting.  You have shortness of breath.  You are very tired (lethargic) or have confusion. Summary  Ischemic colitis is damage to the large intestine due to reduced blood flow (ischemia) to the colon.  Some of the symptoms of this condition include abdominal pain or tenderness, bloody stools, and an urgent need to have a bowel movement.  Diagnosis usually includes a procedure to examine the inside of the colon using a scope that is passed through the rectum (colonoscopy). This information is not intended to replace advice given to you by your health care provider. Make sure you discuss any questions you have with your health care provider. Document Released: 01/16/2017 Document Revised: 01/16/2017 Document Reviewed: 01/16/2017 Elsevier Interactive Patient Education  2018 [place].  High-Fiber Diet Fiber, also called dietary fiber, is a type  of carbohydrate found in fruits, vegetables, whole grains, and beans. A high-fiber diet can have many health benefits. Your health care provider Stucker recommend a high-fiber diet to help:  Prevent constipation. Fiber can make your bowel movements more regular.  Lower your cholesterol.  Relieve hemorrhoids, uncomplicated diverticulosis, or irritable bowel syndrome.  Prevent overeating as part of a weight-loss plan.  Prevent heart disease, type 2 diabetes, and certain cancers.  What is my plan? The recommended daily intake of fiber includes:  38 grams for men under age 36.  9 grams for men over age 52.  61 grams for women under age 29.  73 grams for women over age 2.  You can get the recommended daily intake of dietary fiber by eating a variety of fruits, vegetables, grains, and beans. Your health care provider Blondin also recommend a fiber supplement if it is not possible to get enough fiber through your diet. What do I need to know about a high-fiber diet?  Fiber supplements have not been widely studied for their effectiveness, so it is better to get fiber through food sources.  Always check the fiber content on thenutrition facts  label of any prepackaged food. Look for foods that contain at least 5 grams of fiber per serving.  Ask your dietitian if you have questions about specific foods that are related to your condition, especially if those foods are not listed in the following section.  Increase your daily fiber consumption gradually. Increasing your intake of dietary fiber too quickly Gaffin cause bloating, cramping, or gas.  Drink plenty of water. Water helps you to digest fiber. What foods can I eat? Grains Whole-grain breads. Multigrain cereal. Oats and oatmeal. Brown rice. Barley. Bulgur wheat. Bonanza. Bran muffins. Popcorn. Rye wafer crackers. Vegetables Sweet potatoes. Spinach. Kale. Artichokes. Cabbage. Broccoli. Green peas. Carrots. Squash. Fruits Berries. Pears.  Apples. Oranges. Avocados. Prunes and raisins. Dried figs. Meats and Other Protein Sources Navy, kidney, pinto, and soy beans. Split peas. Lentils. Nuts and seeds. Dairy Fiber-fortified yogurt. Beverages Fiber-fortified soy milk. Fiber-fortified orange juice. Other Fiber bars. The items listed above Jungman not be a complete list of recommended foods or beverages. Contact your dietitian for more options. What foods are not recommended? Grains White bread. Pasta made with refined flour. White rice. Vegetables Fried potatoes. Canned vegetables. Well-cooked vegetables. Fruits Fruit juice. Cooked, strained fruit. Meats and Other Protein Sources Fatty cuts of meat. Fried Sales executive or fried fish. Dairy Milk. Yogurt. Cream cheese. Sour cream. Beverages Soft drinks. Other Cakes and pastries. Butter and oils. The items listed above Reppert not be a complete list of foods and beverages to avoid. Contact your dietitian for more information. What are some tips for including high-fiber foods in my diet?  Eat a wide variety of high-fiber foods.  Make sure that half of all grains consumed each day are whole grains.  Replace breads and cereals made from refined flour or white flour with whole-grain breads and cereals.  Replace white rice with brown rice, bulgur wheat, or millet.  Start the day with a breakfast that is high in fiber, such as a cereal that contains at least 5 grams of fiber per serving.  Use beans in place of meat in soups, salads, or pasta.  Eat high-fiber snacks, such as berries, raw vegetables, nuts, or popcorn. This information is not intended to replace advice given to you by your health care provider. Make sure you discuss any questions you have with your health care provider. Document Released: 11/28/2005 Document Revised: 05/05/2016 Document Reviewed: 05/13/2014 Elsevier Interactive Patient Education  [employer].

## 2018-03-15 NOTE — Progress Notes (Signed)
Surgical Clinic Progress/Follow-up Note   HPI:  84 y.o. Male presents to clinic for follow-up evaluation of Right-sided abdominal pain with non-bloody diarrhea. Patient reports the pain does not appear to be related to what he eats, since the pain often occurs when he awakes in the morning before he's eaten anything and has not improved despite him saying he has changed his diet to avoid fatty foods. Patient first began experiencing Right abdominal pain this past December, at which time he says his pain was worst and he was diagnosed with gallstones, prompting HIDA scan which demonstrated a patent cystic duct but moderately decreased gallbladder ejection fraction, shortly after which he was admitted for Right abdominal pain with lower GI bleed and found on sigmoidoscopy to have "diffuse severe inflammation" with ulcerations attributed to ischemic colitis. Patient's pain and his bleeding per rectum improved and resolved with hydration, and patient was discharged home with encouraged hydration. Patient states he does not drink enough water, has additionally been experiencing loose non-bloody BM's, and when asked further, patient explains his "improved diet" this morning for example included [place] sausage with gravy and cheese grits. He otherwise takes Protonix daily and denies GERD/heartburn, N/V, weight loss, food fear, fever/chills, CP, or SOB.  Review of Systems:  Constitutional: denies any other weight loss, fever, chills, or sweats  Eyes: denies any other vision changes, history of eye injury  ENT: denies sore throat, hearing problems  Respiratory: denies shortness of breath, wheezing  Cardiovascular: denies chest pain, palpitations  Gastrointestinal: abdominal pain, N/V, and bowel function as per HPI Musculoskeletal: denies any other joint pains or cramps  Skin: Denies any other rashes or skin discolorations  Neurological: denies any other headache, dizziness, weakness  Psychiatric: denies  any other depression, anxiety  All other review of systems: otherwise negative   Vital Signs:  BP 118/66   Pulse (!) 55   Temp 97.9 F (36.6 C) (Oral)   Ht 5\' 6"  (1.676 m)   Wt 178 lb 6.4 oz (80.9 kg)   BMI 28.79 kg/m    Physical Exam:  Constitutional:  -- Normal body habitus  -- Awake, alert, and oriented x3  Eyes:  -- Pupils equally round and reactive to light  -- No scleral icterus  Ear, nose, throat:  -- No jugular venous distension  -- No nasal drainage, bleeding Pulmonary:  -- No crackles -- Equal breath sounds bilaterally -- Breathing non-labored at rest Cardiovascular:  -- S1, S2 present  -- No pericardial rubs  Gastrointestinal:  -- Soft and non-distended with mild RUQ tenderness to deep palpation, no guarding/rebound  -- No abdominal masses appreciated, pulsatile or otherwise  Musculoskeletal / Integumentary:  -- Wounds or skin discoloration: None appreciated  -- Extremities: B/L UE and LE FROM, hands and feet warm, no edema  Neurologic:  -- Motor function: intact and symmetric  -- Sensation: intact and symmetric   Laboratory studies:  CBC Latest Ref Rng & Units 02/21/2018 02/21/2018 02/21/2018  WBC 3.8 - 10.6 K/uL - - -  Hemoglobin 13.0 - 18.0 g/dL 14.3 13.9 13.6  Hematocrit 40.0 - 52.0 % - - -  Platelets 150 - 440 K/uL - - -   CMP Latest Ref Rng & Units 02/19/2018 02/18/2018 01/08/2018  Glucose 65 - 99 mg/dL 86 108(H) 85  BUN 6 - 20 mg/dL 20 24(H) 22(H)  Creatinine 0.61 - 1.24 mg/dL 1.23 1.28(H) 1.25(H)  Sodium 135 - 145 mmol/L 140 142 139  Potassium 3.5 - 5.1 mmol/L 3.6 4.1 4.3  Chloride 101 - 111 mmol/L 109 108 104  CO2 22 - 32 mmol/L 22 27 27   Calcium 8.9 - 10.3 mg/dL 8.1(L) 8.9 8.8(L)  Total Protein 6.5 - 8.1 g/dL - 7.1 7.3  Total Bilirubin 0.3 - 1.2 mg/dL - 0.8 0.8  Alkaline Phos 38 - 126 U/L - 45 43  AST 15 - 41 U/L - 46(H) 56(H)  ALT 17 - 63 U/L - 44 62    Imaging:  Sigmoidoscopy Allen Norris, 02/20/2018) Diffuse severe inflammation characterized  by confluent  ulcerations was found in sigmoid colon, secondary to ischemic colitis.  CT Abdomen and Pelvis with Contrast (02/18/2018) - personally reviewed and discussed with patient and his family 1. Slight increased attenuation is noted within the lumen of the colon which Befort be related to hemorrhage given the clinical history. No areas of active extravasation are noted at this time. 2. Cholelithiasis without complicating factors. 3. Sigmoid diverticulosis, moderate colonic stool burden, and small hiatal hernia. Normal appendix. 4.  Aortic Atherosclerosis    NM Hepatobiliary Scan with Ejection Fraction (02/15/2018) No evidence of cystic duct or common bile duct obstruction.  The calculated gallbladder ejection fraction is 22%. (Normal  gallbladder ejection fraction with Ensure is >33%.)   Assessment:  84 y.o. yo Male with a problem list including...  Patient Active Problem List   Diagnosis Date Noted  . Blood in stool   . Ischemic bowel disease ([place])   . Lower GI bleed   . BRBPR (bright red blood per rectum) 02/18/2018  . Calculus of gallbladder without cholecystitis without obstruction 02/01/2018  . Right upper quadrant abdominal pain 02/01/2018  . Chest pain 10/20/2017  . Syncope and collapse 10/20/2017  . S/P ORIF (open reduction internal fixation) fracture 03/07/2017  . Lower extremity edema 03/07/2017  . Chronic diastolic CHF (congestive heart failure) ([place]) 01/19/2015  . Cervical myelopathy with cervical radiculopathy 06/28/2013  . Cataract of both eyes 06/28/2013  . Anemia of chronic disease 04/14/2013  . Spondylosis, cervical, with myelopathy   . Spinal stenosis, lumbar region, with neurogenic claudication   . DDD (degenerative disc disease), cervical   . PAF (paroxysmal atrial fibrillation) ([place])   . Hypertension   . Dizziness 11/09/2011  . Edema 05/18/2011  . Long term current use of anticoagulant 03/02/2011  . VITAMIN D DEFICIENCY 11/29/2010  . Anxiety  07/16/2009  . LOW BACK PAIN SYNDROME 03/17/2009  . NONTRAUMATIC RUPTURE OF TENDONS OF BICEPS 12/01/2008  . ERECTILE DYSFUNCTION, ORGANIC 12/10/2007  . Hyperlipidemia 05/28/2007  . SEXUAL DYSFUNCTION 05/28/2007  . FATTY LIVER DISEASE- ABD. U/S 11/01, MILD ELEVATED LFT'S 05/28/2007    presents to clinic for follow-up evaluation of Right-sided abdominal pain with currently non-bloody diarrhea, attributable to cholelithiasis with biliary dyskinesia without cholecystitis vs low-flow ischemic colitis exacerbated by dehydration (worse due to diarrhea + not drinking enough water), and less likely gastric pathology (GERD, PUD)\, complicated by extensive comorbidities including HTN, HLD, chronic Eliquis anticoagulation for atrial fibrillation with syncope (10/2017) without known history of stroke, chronic diastolic heart failure with peripheral edema on chronic Lasix, dyspnea with exertion, dysphasia, history of DVT, nephrolithiasis, cervical degenerative disc disease with inability to extend his neck, history of shoulder squamous cell carcinoma, and generalized anxiety disorder.   - maintain hydration by drinking enough water, particularly (more) if loose BM's             - discussed at great length to minimize foods with higher fat content (meats, cheeses/dairy, fried)             -  prefer low-fat vegetables, whole grains (wheat bread, cereals, etc), and fruits from gallbladder perspective             - advised to keep a record of foods eaten and note if particular foods seem to increase or decrease pain  - return to clinic in 2 weeks, at which time we will reassess probability of biliary etiology vs colitis  - cardiology risk stratification and confirmation of optimization appreciated Rockey Situ, 02/14/2018)  - if biliary etiology for pain appears more likely after above, will plan for lap chole  - will plan to hold Eliquis x 48 hours prior to laparoscopic cholecystectomy  - instructed to call office if any  questions or concerns  All of the above recommendations were discussed at length with the patient and patient's family, and all of patient's and family's questions were answered to their expressed satisfaction.  -- Marilynne Drivers Rosana Hoes, MD, [place]: [facility] - Partnering for exceptional care. Office: [phone]

## 2018-03-15 NOTE — Patient Instructions (Addendum)
Eat safe foods  Fruits, vegetables, grains, beans.  Please call our office with any questions or concerns.        Biliary Colic, Adult Biliary colic is severe pain caused by a problem with a small organ in the upper right part of your belly (gallbladder). The gallbladder stores a digestive fluid produced in the liver (bile) that helps the body break down fat. Bile and other digestive enzymes are carried from the liver to the small intestine though tube-like structures (bile ducts). The gallbladder and the bile ducts form the biliary tract. Sometimes hard deposits of digestive fluids form in the gallbladder (gallstones) and block the flow of bile from the gallbladder, causing biliary colic. This condition is also called a gallbladder attack. Gallstones can be as small as a grain of sand or as big as a golf ball. There could be just one gallstone in the gallbladder, or there could be many. What are the causes? Biliary colic is usually caused by gallstones. Less often, a tumor could block the flow of bile from the gallbladder and trigger biliary colic. What increases the risk? This condition is more likely to develop in:  Women.  People of Hispanic descent.  People with a family history of gallstones.  People who are obese.  People who suddenly or quickly lose weight.  People who eat a high-calorie, low-fiber diet that is rich in refined carbs (carbohydrates), such as white bread and white rice.  People who have an intestinal disease that affects nutrient absorption, such as Crohn disease.  People who have a metabolic condition, such as metabolic syndrome or diabetes.  What are the signs or symptoms? Severe pain in the upper right side of the belly is the main symptom of biliary colic. You Dominski feel this pain below the chest but above the hip. This pain often occurs at night or after eating a very fatty meal. This pain Rivkin get worse for up to an hour and last as long as 12 hours.  In most cases, the pain fades (subsides) within a couple hours. Other symptoms of this condition include:  Nausea and vomiting.  Pain under the right shoulder.  How is this diagnosed? This condition is diagnosed based on your medical history, your symptoms, and a physical exam. You Vandenbosch have tests, including:  Blood tests to rule out infection or inflammation of the bile ducts, gallbladder, pancreas, or liver.  Imaging studies such as: ? Ultrasound. ? CT scan. ? MRI.  In some cases, you Snider need to have an imaging study done using a small amount of radioactive material (nuclear medicine) to confirm the diagnosis. How is this treated? Treatment for this condition Keaney include medicine to relieve your pain or nausea. If you have gallstones that are causing biliary colic, you Ervine need surgery to remove the gallbladder (cholecystectomy). Gallstones can also be dissolved gradually with medicine. It Pastrana take months or years before the gallstones are completely gone. Follow these instructions at home:  Take over-the-counter and prescription medicines only as told by your health care provider.  Drink enough fluid to keep your urine clear or pale yellow.  Follow instructions from your health care provider about eating or drinking restrictions. These Maddix include avoiding: ? Fatty, greasy, and fried foods. ? Any foods that make the pain worse. ? Overeating. ? Having a large meal after not eating for a while.  Keep all follow-up visits as told by your health care provider. This is important. How is this  prevented? Steps to prevent this condition include:  Maintaining a healthy body weight.  Getting regular exercise.  Eating a healthy, high-fiber, low-fat diet.  Limiting how much sugar and refined carbs you eat, such as sweets, white flour, and white rice.  Contact a health care provider if:  Your pain lasts more than 5 hours.  You vomit.  You have a fever and chills.  Your pain  gets worse. Get help right away if:  Your skin or the whites of your eyes look yellow (jaundice).  Your have tea-colored urine and light-colored stools.  You are dizzy or you faint. This information is not intended to replace advice given to you by your health care provider. Make sure you discuss any questions you have with your health care provider. Document Released: 05/01/2006 Document Revised: 07/26/2016 Document Reviewed: 06/13/2016 Elsevier Interactive Patient Education  [employer].

## 2018-03-16 ENCOUNTER — Encounter: Payer: Self-pay | Admitting: Surgery

## 2018-03-21 ENCOUNTER — Ambulatory Visit: Payer: PPO | Admitting: Surgery

## 2018-03-22 ENCOUNTER — Ambulatory Visit: Payer: Self-pay | Admitting: Surgery

## 2018-03-22 DIAGNOSIS — R0602 Shortness of breath: Secondary | ICD-10-CM | POA: Diagnosis not present

## 2018-03-22 DIAGNOSIS — I5032 Chronic diastolic (congestive) heart failure: Secondary | ICD-10-CM | POA: Diagnosis not present

## 2018-03-29 ENCOUNTER — Ambulatory Visit: Payer: PPO | Admitting: Gastroenterology

## 2018-04-02 ENCOUNTER — Telehealth: Payer: Self-pay | Admitting: Gastroenterology

## 2018-04-02 NOTE — Telephone Encounter (Signed)
Pt daughter Eric [name]  is calling stating  Her father is in extreme pain she states Dr. Rosana Hoes  Thinks it is not gallbladder she would like a call From Dr. Allen Norris she states the pt is tired of hurting. Pt is doing recommended diet and evrything he has been told and has no relieve  cb [phone]

## 2018-04-03 NOTE — Telephone Encounter (Signed)
I called the patient's daughter and explained the results of his bladder emptying study and the diagnosis on radiology of chronic cholecystitis.  I also went over the surgeons report with the patient's daughter and his plan.  The patient's daughter was also explain the finding of ischemic colitis and that that can cause him to have pain.  The patient's daughter stated that she is setting up a second opinion [facility] on [address].  I have informed her that I will let the new gastrologist formulate any further plans the patient so that multiple people are not giving her mixed signals.  She has been told that if he has any urgencies or emergencies before then she can contact my office.

## 2018-04-09 ENCOUNTER — Other Ambulatory Visit: Payer: Self-pay | Admitting: Cardiovascular Disease

## 2018-04-16 DIAGNOSIS — R140 Abdominal distension (gaseous): Secondary | ICD-10-CM | POA: Diagnosis not present

## 2018-04-16 DIAGNOSIS — R1011 Right upper quadrant pain: Secondary | ICD-10-CM | POA: Diagnosis not present

## 2018-04-16 DIAGNOSIS — R197 Diarrhea, unspecified: Secondary | ICD-10-CM | POA: Diagnosis not present

## 2018-04-21 DIAGNOSIS — R0602 Shortness of breath: Secondary | ICD-10-CM | POA: Diagnosis not present

## 2018-04-21 DIAGNOSIS — I5032 Chronic diastolic (congestive) heart failure: Secondary | ICD-10-CM | POA: Diagnosis not present

## 2018-04-23 ENCOUNTER — Telehealth: Payer: Self-pay | Admitting: Cardiovascular Disease

## 2018-04-23 NOTE — Telephone Encounter (Signed)
Patient daughter Jenny Reichmann calling  Patient was cleared for gall bladder surgery  They have switched surgeons and would like for the clearance form to be faxed to them at [phone] Please advise

## 2018-04-24 NOTE — Telephone Encounter (Signed)
No answer. Left message to call back to discuss clearance.

## 2018-04-24 NOTE — Telephone Encounter (Signed)
Patient daughter returning our call She states when calling her back please if we can to call around 945 or 1045am  She is at work and those are the times she would be free

## 2018-04-24 NOTE — Telephone Encounter (Signed)
S/w patient's daughter, ok per DPR. Patient is seeing a new surgeon, Dr Coralie Keens, at [facility] in [place] next Tuesday. Advised her to please have their office send [place] a request for clearance as this is the process for this. I provided her with our fax number. She verbalized understanding to do this.  She also states patient has not had any new cardiac symptoms such as chest pain or shortness of breath.

## 2018-04-26 ENCOUNTER — Ambulatory Visit: Payer: PPO | Admitting: Internal Medicine

## 2018-05-01 ENCOUNTER — Other Ambulatory Visit: Payer: Self-pay | Admitting: Surgery

## 2018-05-01 DIAGNOSIS — K8020 Calculus of gallbladder without cholecystitis without obstruction: Secondary | ICD-10-CM | POA: Diagnosis not present

## 2018-05-10 ENCOUNTER — Other Ambulatory Visit: Payer: Self-pay | Admitting: Cardiovascular Disease

## 2018-05-10 ENCOUNTER — Other Ambulatory Visit: Payer: Self-pay | Admitting: Nurse Practitioner

## 2018-05-10 ENCOUNTER — Other Ambulatory Visit: Payer: Self-pay | Admitting: Internal Medicine

## 2018-05-21 NOTE — Pre-Procedure Instructions (Signed)
Eric [name]  05/21/2018      [id number] - [name], [place] AT [address] [place] [address] Phone: [phone] Fax: [phone]  [facility] [place], [place] - [address] [facility] [place] [address] Phone: [phone] Fax: [phone]    Your procedure is scheduled on Thurs., May 24, 2018 from 7:30AM-8:30AM  Report to [facility] Admitting Entrance "A" at 5:30AM  Call this number if you have problems the morning of surgery:  [phone]   Remember:  No food or drinks after midnight on June 12th   Take these medicines the morning of surgery with A SIP OF WATER By 4:30AM: Amiodarone (PACERONE), Gabapentin (NEURONTIN), Metoprolol tartrate (LOPRESSOR),  Ursodiol (ACTIGALL), and Fluticasone (FLONASE). If needed Cetirizine (ZYRTEC) and TraMADol Eric [name])  Follow your doctors instructions regarding your Eliquis.  If no instructions were given by your doctor, then you will need to call the prescribing office office to get instructions.    As of today, stop taking all Other Aspirin Products, Vitamins, Fish oils, and Herbal medications. Also stop all NSAIDS i.e. Advil, Ibuprofen, Motrin, Aleve, Anaprox, Naproxen, BC, Goody Powders, and all Supplements.    Do not wear jewelry.  Do not wear lotions, powders, colognes, or deodorant.  Do not shave 48 hours prior to surgery.  Men Gell shave face.  Do not bring valuables to the hospital.  [facility] is not responsible for any belongings or valuables.  Contacts, dentures or bridgework Lard not be worn into surgery.  Leave your suitcase in the car.  After surgery it Gage be brought to your room.  For patients admitted to the hospital, discharge time will be determined by your treatment team.  Patients discharged the day of surgery will not be allowed to drive home.   Special  instructions: [place]- Preparing For Surgery  Before surgery, you can play an important role. Because skin is not sterile, your skin needs to be as free of germs as possible. You can reduce the number of germs on your skin by washing with CHG (chlorahexidine gluconate) Soap before surgery.  CHG is an antiseptic cleaner which kills germs and bonds with the skin to continue killing germs even after washing.    Oral Hygiene is also important to reduce your risk of infection.  Remember - BRUSH YOUR TEETH THE MORNING OF SURGERY WITH YOUR REGULAR TOOTHPASTE  Please do not use if you have an allergy to CHG or antibacterial soaps. If your skin becomes reddened/irritated stop using the CHG.  Do not shave (including legs and underarms) for at least 48 hours prior to first CHG shower. It is OK to shave your face.  Please follow these instructions carefully.   1. Shower the NIGHT BEFORE SURGERY and the MORNING OF SURGERY with CHG.   2. If you chose to wash your hair, wash your hair first as usual with your normal shampoo.  3. After you shampoo, rinse your hair and body thoroughly to remove the shampoo.  4. Use CHG as you would any other liquid soap. You can apply CHG directly to the skin and wash gently with a scrungie or a clean washcloth.   5. Apply the CHG Soap to your body ONLY FROM THE NECK DOWN.  Do not use on open wounds or open sores. Avoid contact with your eyes, ears, mouth and genitals (  private parts). Wash Face and genitals (private parts)  with your normal soap.  6. Wash thoroughly, paying special attention to the area where your surgery will be performed.  7. Thoroughly rinse your body with warm water from the neck down.  8. DO NOT shower/wash with your normal soap after using and rinsing off the CHG Soap.  9. Pat yourself dry with a CLEAN TOWEL.  10. Wear CLEAN PAJAMAS to bed the night before surgery, wear comfortable clothes the morning of surgery  11. Place CLEAN SHEETS on  your bed the night of your first shower and DO NOT SLEEP WITH PETS.  Day of Surgery:  Do not apply any deodorants/lotions.  Please wear clean clothes to the hospital/surgery center.   Remember to brush your teeth WITH YOUR REGULAR TOOTHPASTE.  Please read over the following fact sheets that you were given. Pain Booklet, Coughing and Deep Breathing and Surgical Site Infection Prevention

## 2018-05-21 NOTE — Progress Notes (Addendum)
Anesthesia PAT Evaluation:   Case:  [id number] Date/Time:  05/24/18 0715   Procedure:  LAPAROSCOPIC CHOLECYSTECTOMY (N/A )   Anesthesia type:  General   Pre-op diagnosis:  SYMPTOMATIC GALLSTONES   Location:  [facility] OR ROOM 01 / [facility]   Surgeon:  Coralie Keens, MD      DISCUSSION: Patient is a 84 year old male scheduled for the above procedure. History includes former smoker, DIFFICULT INTUBATION, HTN, HLD, afib/PAF (s/p cardioversion 06/20/17), chronic diastolic CHF, chest pain '18 (non-ischemic stress test 05/2017), syncope 10/2017 (unlcear etiology; event monitor RSR, rare PACs, PVCs), DVT '64.  - Hospitalized 02/18/18-02/21/18 for lower GI bleed. Colonoscopy showed ischemic colitis.  He has cardiology clearance (last office visit with Murray Hodgkins, NP on 12/20/17). According to 02/14/18 telephone encounter by Ida Rogue, MD, "Acceptable risk for surgery Would hold the eliquis 2 days prior to surgery Restart anticoagulation when approved by surgical team." Last Eliquis 05/21/18.  He believes difficult intubation is from his 02/10/11 surgery with Phylliss Bob, MD on 02/10/11 (laminectomy with spinal cord decompression C3-C6, bilateral posterior laminotomy with partial facetectomy and foraminotomy C4-5, C6-7, posterior spinal fusion C2-3, C3-4, C4-5, C5-6, C6-7, C7-T1, insertion of posterior segmental instrumentation C2 to T1). His post-operative course was complicated by dysphagia and concerns for aspiration requiring a short term Panda tube (advanced to Puree diet 02/19/11 and advanced as tolerated (he denied any ongoing issues since). He also had some post-operative paroxysmal hypoxemia felt related to mucous plug.   He does have limited neck mobility with left and extension. When his neck is in a relaxed position, it is somewhat forward projecting. Mallampati II. ANESTHESIA RECORDS REQUESTED. UPDATE: 02/10/11 anesthesia record received. There is no mention of difficult airway. A 8.0 ETT was placed  with 1 attempt using Miller 2 blade, stylet  Unfortunately his BMET specimen hemolyzed so this will need to be repeated on the day of surgery. He also needs PT/INR.  VS: BP (!) 157/70   Pulse (!) 54   Temp 36.7 C   Resp 20   Ht 5\' 6"  (1.676 m)   Wt 171 lb 4.8 oz (77.7 kg)   SpO2 95%   BMI 27.65 kg/m  Lungs clear. Heart slightly bradycardic, but regular. No murmur noted.   PROVIDERS: [name], DO is PCP Patient Care Team: [name], MD as Consulting Physician (Cardiology) Normajean Glasgow, MD as Attending Physician (Physical Medicine and Rehabilitation) Phylliss Bob, MD as Consulting Physician (Orthopedic Surgery) Lucilla Lame, MD as Consulting Physician (Gastroenterology) Merton Border, MD as Pulmonologist. Last visit 12/14/17 for chronic cough. He has chronic rhinitis and GERD. Flonase and Protonix recommended with PRN pulmonology follow-up.   LABS: CBC WNL. Unfortunately, BMET specimen hemolyzed, so will need to be redrawn on the day of surgery. Historically, his Cr has been in the 1.20-1.30 range. (all labs ordered are listed, but only abnormal results are displayed)  Labs Reviewed  CBC    IMAGES: CXR 11/12/17: IMPRESSION: No evidence of acute cardiopulmonary disease. Mild bibasilar atelectasis/ scarring again noted.   EKG: 12/20/17: SR with first degree AV block.   CV: Echo 10/20/17: Study Conclusions - Left ventricle: The cavity size was normal. Wall thickness was   increased in a pattern of mild LVH. Systolic function was normal.   The estimated ejection fraction was in the range of 60% to 65%.   Wall motion was normal; there were no regional wall motion   abnormalities. Features are consistent with a pseudonormal left  ventricular filling pattern, with concomitant abnormal relaxation   and increased filling pressure (grade 2 diastolic dysfunction).  Nuclear stress test 05/26/17: Pharmacological myocardial perfusion imaging study with no significant   ischemia Significant GI uptake artifact  Normal wall motion, EF estimated at 79% No EKG changes concerning for ischemia at peak stress or in recovery. Rhythm is atrial fibrillation, rare PVCs Low risk scan  30 day Event Monitor 12/2017: Normal sinus rhythm with no significant arrhythmia Rare APCs and PVCs noted    Past Medical History:  Diagnosis Date  . Anxiety   . Arthritis   . Atrial fibrillation ([place]) 05/28/2007    Qualifier: Diagnosis of  By: Council Mechanic MD, Hilaria Ota    . Atypical chest pain    a. 05/2017 MV: no ischemia, EF 79%.  . Chest pain 10/20/2017  . Chronic diastolic CHF (congestive heart failure) ([place])    a. 01/2011 Echo: EF 50-55%, gr1 DD, mild AI, nl RV fxn, mild TR/PR; b. 10/2017 Echo: EF 60-65%, mild LVH, gr2 DD.  Marland Kitchen DDD (degenerative disc disease), cervical   . Depressive disorder, not elsewhere classified   . Difficult intubation   . Dysphagia, oral phase   . Dyspnea   . Edema   . Gallstones    Symptomatic  . History of DVT (deep vein thrombosis)   . History of kidney stones   . Hypertension   . HYPERTENSION, BENIGN ESSENTIAL 05/28/2007   Qualifier: Diagnosis of  By: Council Mechanic MD, Hilaria Ota   . Hypoxemia   . Impacted cerumen   . Long term current use of anticoagulant 03/02/2011  . LOW BACK PAIN SYNDROME 03/17/2009   Qualifier: Diagnosis of  By: Council Mechanic MD, Hilaria Ota   . Mixed hyperlipidemia   . 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 Other myelopathy   . Pain in limb   . Palpitations   . Paroxysmal Atrial Fibrillation ([place])    a. a. 01/2011 in setting of post-op complications including aspiration pna;  b. CHA2DS2VASc = 4--> Amio/Eliquis.  . Pneumonia 03/06/2003  . Spinal stenosis, unspecified region other than cervical   . 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 Syncope    a. 10/2017-->Event monitor: RSR, rare PACs/PVCs.  . Syncope and collapse 10/20/2017  . Thoracic or lumbosacral  neuritis or radiculitis, unspecified     Past Surgical History:  Procedure Laterality Date  . CARDIOVERSION N/A 06/20/2017   Procedure: CARDIOVERSION;  Surgeon: [name], MD;  Location: [facility] ORS;  Service: Cardiovascular;  Laterality: N/A;  . CATARACT EXTRACTION W/ INTRAOCULAR LENS  IMPLANT, BILATERAL    . CERVICAL FUSION  02/10/2011  . COLONOSCOPY WITH PROPOFOL N/A 02/20/2018   Procedure: COLONOSCOPY WITH PROPOFOL;  Surgeon: Lucilla Lame, MD;  Location: [facility] ENDOSCOPY;  Service: Endoscopy;  Laterality: N/A;  . history of abd ultrasound  11/01   fatty liver  . MULTIPLE TOOTH EXTRACTIONS    . ORIF FIBULA FRACTURE Left 01/06/2017   Procedure: OPEN REDUCTION INTERNAL FIXATION (ORIF) FIBULA FRACTURE DISTAL FIBULA;  Surgeon: Melrose Nakayama, MD;  Location: [facility];  Service: Orthopedics;  Laterality: Left;  Patient states has problems if he will have a tube in throat for Genera; Anesthesia    MEDICATIONS: . amiodarone (PACERONE) 200 MG tablet  . apixaban (ELIQUIS) 5 MG TABS tablet  . cetirizine (ZYRTEC) 10 MG tablet  . Cholecalciferol (VITAMIN D) 1000 UNITS capsule  . fluticasone (FLONASE)  50 MCG/ACT nasal spray  . furosemide (LASIX) 40 MG tablet  . gabapentin (NEURONTIN) 300 MG capsule  . metoprolol tartrate (LOPRESSOR) 25 MG tablet  . pantoprazole (PROTONIX) 40 MG tablet  . potassium chloride SA (K-DUR,KLOR-CON) 20 MEQ tablet  . pravastatin (PRAVACHOL) 80 MG tablet  . traMADol (ULTRAM) 50 MG tablet  . ursodiol (ACTIGALL) 300 MG capsule   No current facility-administered medications for this encounter.    [facility] Short Stay Center/Anesthesiology Phone [phone] 05/22/2018 4:00 PM

## 2018-05-22 ENCOUNTER — Other Ambulatory Visit: Payer: Self-pay

## 2018-05-22 ENCOUNTER — Encounter (HOSPITAL_COMMUNITY)
Admission: RE | Admit: 2018-05-22 | Discharge: 2018-05-22 | Disposition: A | Discharge status: Home / Self Care | Payer: PPO | Source: Ambulatory Visit | Attending: Surgery | Admitting: Surgery

## 2018-05-22 ENCOUNTER — Encounter (HOSPITAL_COMMUNITY): Payer: Self-pay

## 2018-05-22 DIAGNOSIS — Z87891 Personal history of nicotine dependence: Secondary | ICD-10-CM | POA: Diagnosis not present

## 2018-05-22 DIAGNOSIS — Z885 Allergy status to narcotic agent status: Secondary | ICD-10-CM | POA: Diagnosis not present

## 2018-05-22 DIAGNOSIS — Z7901 Long term (current) use of anticoagulants: Secondary | ICD-10-CM | POA: Diagnosis not present

## 2018-05-22 DIAGNOSIS — I4891 Unspecified atrial fibrillation: Secondary | ICD-10-CM | POA: Diagnosis not present

## 2018-05-22 DIAGNOSIS — I5032 Chronic diastolic (congestive) heart failure: Secondary | ICD-10-CM | POA: Diagnosis not present

## 2018-05-22 DIAGNOSIS — Z01818 Encounter for other preprocedural examination: Secondary | ICD-10-CM | POA: Insufficient documentation

## 2018-05-22 DIAGNOSIS — R0602 Shortness of breath: Secondary | ICD-10-CM | POA: Diagnosis not present

## 2018-05-22 DIAGNOSIS — E7800 Pure hypercholesterolemia, unspecified: Secondary | ICD-10-CM | POA: Diagnosis not present

## 2018-05-22 DIAGNOSIS — G4730 Sleep apnea, unspecified: Secondary | ICD-10-CM | POA: Diagnosis not present

## 2018-05-22 DIAGNOSIS — I110 Hypertensive heart disease with heart failure: Secondary | ICD-10-CM | POA: Diagnosis not present

## 2018-05-22 DIAGNOSIS — K8010 Calculus of gallbladder with chronic cholecystitis without obstruction: Secondary | ICD-10-CM | POA: Diagnosis not present

## 2018-05-22 DIAGNOSIS — Z79899 Other long term (current) drug therapy: Secondary | ICD-10-CM | POA: Diagnosis not present

## 2018-05-22 DIAGNOSIS — K219 Gastro-esophageal reflux disease without esophagitis: Secondary | ICD-10-CM | POA: Diagnosis not present

## 2018-05-22 DIAGNOSIS — Z01812 Encounter for preprocedural laboratory examination: Secondary | ICD-10-CM | POA: Diagnosis not present

## 2018-05-22 DIAGNOSIS — K8020 Calculus of gallbladder without cholecystitis without obstruction: Secondary | ICD-10-CM | POA: Diagnosis present

## 2018-05-22 HISTORY — DX: Calculus of gallbladder without cholecystitis without obstruction: K80.20

## 2018-05-22 LAB — CBC
HEMATOCRIT: 47.6 % (ref 39.0–52.0)
HEMOGLOBIN: 15.2 g/dL (ref 13.0–17.0)
MCH: 31.5 pg (ref 26.0–34.0)
MCHC: 31.9 g/dL (ref 30.0–36.0)
MCV: 98.6 fL (ref 78.0–100.0)
Platelets: 202 10*3/uL (ref 150–400)
RBC: 4.83 MIL/uL (ref 4.22–5.81)
RDW: 12.6 % (ref 11.5–15.5)
WBC: 6.7 10*3/uL (ref 4.0–10.5)

## 2018-05-22 NOTE — Progress Notes (Signed)
PCP - Dr. Hollace Kinnier  Cardiologist - Ida Rogue  Chest x-ray - 11/12/17 (E)  EKG - 12/20/17 (E)  Stress Test - 05/27/17 (E)  ECHO - 10/20/17 (E)  Cardiac Cath - Denies  Sleep Study - Denies CPAP - None- Pt sts he uses O2 at bedtime  LABS- 05/22/18: CBC, BMP PT: 05/24/18  ASA- Denies Eliquis- LD-6/10   Anesthesia- Yes- Cardiac history/ Difficult Intubation  Pt denies having chest pain, sob, or fever at this time. All instructions explained to the pt, with a verbal understanding of the material. Pt agrees to go over the instructions while at home for a better understanding. The opportunity to ask questions was provided.

## 2018-05-23 NOTE — H&P (Signed)
Eric [name] Documented: 05/01/2018 10:21 AM Location: [facility] Patient #: [id number] DOB: September 25, 1932 Married / Language: Cleophus Molt / Race: White Male   History of Present Illness (Eric [name] A. Ninfa Linden MD; 05/01/2018 10:44 AM) The patient is a 84 year old male who presents for evaluation of gall stones. This patient is here to see me today for symptomatic cholelithiasis. He had been seen at a [facility] and has already been cleared by his cardiologist for surgery. He decided to come and see me as I have operated on his family was in the past. He is still having right upper quadrant abdominal pain hurting due to the back with bloating. He denies nausea and vomiting. This is worse with fatty meals. His bowel movements have been normal. He has a lot of flatus. The pain is moderate in intensity. It is described as sharp. He is currently otherwise without complaints. He has had an ultra sound showing gallstones and a HIDA scan showing a 22% gallbladder ejection fraction.   Past Surgical History (Tanisha A. Owens Shark, [place]; 05/01/2018 10:21 AM) Cataract Surgery  Bilateral. Colon Polyp Removal - Colonoscopy  Foot Surgery  Left. Spinal Surgery - Neck   Allergies (Tanisha A. Owens Shark, RMA; 05/01/2018 10:23 AM) Morphine Sulfate *ANALGESICS - OPIOID*  Shortness of breath. Percocet *ANALGESICS - OPIOID*  Valium *ANTIANXIETY AGENTS*  Allergies Reconciled   Medication History (Tanisha A. Owens Shark, [place]; 05/01/2018 10:24 AM) TraMADol HCl (50MG  Tablet, Oral) Active. Pravastatin Sodium (80MG  Tablet, Oral) Active. Pantoprazole Sodium (40MG  Tablet DR, Oral) Active. Metoprolol Tartrate (25MG  Tablet, Oral) Active. Gabapentin (300MG  Capsule, Oral) Active. Furosemide (20MG  Tablet, Oral) Active. Amiodarone HCl (200MG  Tablet, Oral) Active. Apixaban (5MG  Tablet, Oral) Active. ZyrTEC Allergy (10MG  Tablet, Oral) Active. Vitamin D (1000UNIT Tablet, Oral) Active. Medications Reconciled  Social  History (Tanisha A. Owens Shark, [place]; 05/01/2018 10:21 AM) Caffeine use  Coffee, Tea. No alcohol use  No drug use  Tobacco use  Former smoker.  Family History (Tanisha A. Owens Shark, [place]; 05/01/2018 10:21 AM) Arthritis  Mother. Breast Cancer  Daughter. Cerebrovascular Accident  Father. Hypertension  Father.  Other Problems (Tanisha A. Owens Shark, [place]; 05/01/2018 10:21 AM) Arthritis  Atrial Fibrillation  Back Pain  Cholelithiasis  Diverticulosis  Gastroesophageal Reflux Disease  General anesthesia - complications  Hypercholesterolemia  Sleep Apnea     Review of Systems (Tanisha A. Brown RMA; 05/01/2018 10:21 AM) General Present- Weight Loss. Not Present- Appetite Loss, Chills, Fatigue, Fever, Night Sweats and Weight Gain. Skin Not Present- Change in Wart/Mole, Dryness, Hives, Jaundice, New Lesions, Non-Healing Wounds, Rash and Ulcer. HEENT Present- Wears glasses/contact lenses. Not Present- Earache, Hearing Loss, Hoarseness, Nose Bleed, Oral Ulcers, Ringing in the Ears, Seasonal Allergies, Sinus Pain, Sore Throat, Visual Disturbances and Yellow Eyes. Respiratory Present- Chronic Cough and Wheezing. Not Present- Bloody sputum, Difficulty Breathing and Snoring. Breast Not Present- Breast Mass, Breast Pain, Nipple Discharge and Skin Changes. Cardiovascular Present- Swelling of Extremities. Not Present- Chest Pain, Difficulty Breathing Lying Down, Leg Cramps, Palpitations, Rapid Heart Rate and Shortness of Breath. Gastrointestinal Present- Change in Bowel Habits. Not Present- Abdominal Pain, Bloating, Bloody Stool, Chronic diarrhea, Constipation, Difficulty Swallowing, Excessive gas, Gets full quickly at meals, Hemorrhoids, Indigestion, Nausea, Rectal Pain and Vomiting. Male Genitourinary Present- Urgency. Not Present- Blood in Urine, Change in Urinary Stream, Frequency, Impotence, Nocturia, Painful Urination and Urine Leakage. Musculoskeletal Present- Swelling of Extremities. Not Present-  Back Pain, Joint Pain, Joint Stiffness, Muscle Pain and Muscle Weakness. Neurological Present- Numbness, Tingling and Trouble walking. Not Present- Decreased Memory, Fainting,  Headaches, Seizures, Tremor and Weakness. Psychiatric Not Present- Anxiety, Bipolar, Change in Sleep Pattern, Depression, Fearful and Frequent crying. Endocrine Not Present- Cold Intolerance, Excessive Hunger, Hair Changes, Heat Intolerance, Hot flashes and New Diabetes. Hematology Present- Blood Thinners and Easy Bruising. Not Present- Excessive bleeding, Gland problems, HIV and Persistent Infections.  Vitals (Tanisha A. Brown RMA; 05/01/2018 10:22 AM) 05/01/2018 10:22 AM Weight: 172 lb Height: 66in Body Surface Area: 1.88 m Body Mass Index: 27.76 kg/m  Temp.: 98.51F  Pulse: 67 (Regular)  BP: 122/68 (Sitting, Left Arm, Standard)       Physical Exam (Alonia Dibuono A. Ninfa Linden MD; 05/01/2018 10:44 AM) General Mental Status-Alert. General Appearance-Consistent with stated age. Hydration-Well hydrated. Voice-Normal.  Head and Neck Head-normocephalic, atraumatic with no lesions or palpable masses.  Eye Eyeball - Bilateral-Extraocular movements intact. Sclera/Conjunctiva - Bilateral-No scleral icterus.  Chest and Lung Exam Chest and lung exam reveals -quiet, even and easy respiratory effort with no use of accessory muscles and on auscultation, normal breath sounds, no adventitious sounds and normal vocal resonance. Inspection Chest Wall - Normal. Back - normal.  Cardiovascular Cardiovascular examination reveals -on palpation PMI is normal in location and amplitude, no palpable S3 or S4. Normal cardiac borders., normal heart sounds, regular rate and rhythm with no murmurs, carotid auscultation reveals no bruits and normal pedal pulses bilaterally.  Abdomen Inspection Inspection of the abdomen reveals - No Hernias. Skin - Scar - no surgical scars. Palpation/Percussion Palpation and  Percussion of the abdomen reveal - Soft, Non Tender, No Rebound tenderness, No Rigidity (guarding) and No hepatosplenomegaly. Auscultation Auscultation of the abdomen reveals - Bowel sounds normal.  Neurologic - Did not examine.  Musculoskeletal - Did not examine.    Assessment & Plan (Mayes Sangiovanni A. Ninfa Linden MD; 05/01/2018 10:45 AM) SYMPTOMATIC CHOLELITHIASIS (K80.20) Impression: I have discussed the diagnosis of symptomatic cholelithiasis with the patient and his family. I gave him literature regarding surgery. I recommend laparoscopic cholecystectomy. He will need to stop his anticoagulation 2 days preoperative. He has already been cleared by cardiology. We discussed the surgical procedure in detail. We discussed the risk which includes but is not limited to bleeding, infection, injury to surrounding structures, need to convert to an open procedure, cardiopulmonary issues, postoperative recovery, etc. they understand and wish to proceed with surgery which will be scheduled

## 2018-05-24 ENCOUNTER — Other Ambulatory Visit: Payer: Self-pay

## 2018-05-24 ENCOUNTER — Encounter (HOSPITAL_COMMUNITY)
Admission: RE | Disposition: A | Discharge status: Home / Self Care | Payer: Self-pay | Source: Ambulatory Visit | Attending: Surgery

## 2018-05-24 ENCOUNTER — Observation Stay (HOSPITAL_COMMUNITY)
Admission: RE | Admit: 2018-05-24 | Discharge: 2018-05-25 | Disposition: A | Discharge status: Home / Self Care | Payer: PPO | Source: Ambulatory Visit | Attending: Surgery | Admitting: Surgery

## 2018-05-24 ENCOUNTER — Encounter (HOSPITAL_COMMUNITY): Payer: Self-pay | Admitting: *Deleted

## 2018-05-24 ENCOUNTER — Ambulatory Visit (HOSPITAL_COMMUNITY): Payer: PPO | Admitting: Vascular Surgery

## 2018-05-24 ENCOUNTER — Ambulatory Visit (HOSPITAL_COMMUNITY): Payer: PPO | Admitting: Certified Registered Nurse Anesthetist

## 2018-05-24 DIAGNOSIS — Z79899 Other long term (current) drug therapy: Secondary | ICD-10-CM | POA: Insufficient documentation

## 2018-05-24 DIAGNOSIS — I110 Hypertensive heart disease with heart failure: Secondary | ICD-10-CM | POA: Insufficient documentation

## 2018-05-24 DIAGNOSIS — I4891 Unspecified atrial fibrillation: Secondary | ICD-10-CM | POA: Diagnosis not present

## 2018-05-24 DIAGNOSIS — Z7901 Long term (current) use of anticoagulants: Secondary | ICD-10-CM | POA: Insufficient documentation

## 2018-05-24 DIAGNOSIS — Z885 Allergy status to narcotic agent status: Secondary | ICD-10-CM | POA: Diagnosis not present

## 2018-05-24 DIAGNOSIS — Z9049 Acquired absence of other specified parts of digestive tract: Secondary | ICD-10-CM

## 2018-05-24 DIAGNOSIS — G4730 Sleep apnea, unspecified: Secondary | ICD-10-CM | POA: Diagnosis not present

## 2018-05-24 DIAGNOSIS — K219 Gastro-esophageal reflux disease without esophagitis: Secondary | ICD-10-CM | POA: Diagnosis not present

## 2018-05-24 DIAGNOSIS — K8010 Calculus of gallbladder with chronic cholecystitis without obstruction: Principal | ICD-10-CM | POA: Insufficient documentation

## 2018-05-24 DIAGNOSIS — Z87891 Personal history of nicotine dependence: Secondary | ICD-10-CM | POA: Diagnosis not present

## 2018-05-24 DIAGNOSIS — I5032 Chronic diastolic (congestive) heart failure: Secondary | ICD-10-CM | POA: Diagnosis not present

## 2018-05-24 DIAGNOSIS — E785 Hyperlipidemia, unspecified: Secondary | ICD-10-CM | POA: Diagnosis not present

## 2018-05-24 DIAGNOSIS — E7800 Pure hypercholesterolemia, unspecified: Secondary | ICD-10-CM | POA: Diagnosis not present

## 2018-05-24 DIAGNOSIS — Z01812 Encounter for preprocedural laboratory examination: Secondary | ICD-10-CM | POA: Diagnosis not present

## 2018-05-24 HISTORY — PX: CHOLECYSTECTOMY: SHX55

## 2018-05-24 LAB — BASIC METABOLIC PANEL
ANION GAP: 9 (ref 5–15)
BUN: 21 mg/dL — ABNORMAL HIGH (ref 6–20)
CALCIUM: 9.1 mg/dL (ref 8.9–10.3)
CO2: 27 mmol/L (ref 22–32)
CREATININE: 1.4 mg/dL — AB (ref 0.61–1.24)
Chloride: 104 mmol/L (ref 101–111)
GFR calc Af Amer: 52 mL/min — ABNORMAL LOW (ref >60)
GFR calc non Af Amer: 45 mL/min — ABNORMAL LOW (ref >60)
Glucose, Bld: 90 mg/dL (ref 65–99)
Potassium: 3.9 mmol/L (ref 3.5–5.1)
Sodium: 140 mmol/L (ref 135–145)

## 2018-05-24 LAB — PROTIME-INR
INR: 1.01
Prothrombin Time: 13.2 seconds (ref 11.4–15.2)

## 2018-05-24 SURGERY — LAPAROSCOPIC CHOLECYSTECTOMY
Anesthesia: General | Site: Abdomen

## 2018-05-24 MED ORDER — ONDANSETRON HCL 4 MG/2ML IJ SOLN
INTRAMUSCULAR | Status: AC
  Filled 2018-05-24: qty 2

## 2018-05-24 MED ORDER — GLYCOPYRROLATE 0.2 MG/ML IJ SOLN
INTRAMUSCULAR | Status: DC | PRN
  Administered 2018-05-24: 0.1 mg via INTRAVENOUS
  Administered 2018-05-24: .1 mg via INTRAVENOUS

## 2018-05-24 MED ORDER — HYDROMORPHONE HCL 2 MG/ML IJ SOLN: 0.5000 mg | INTRAMUSCULAR | Status: DC | PRN

## 2018-05-24 MED ORDER — ROCURONIUM BROMIDE 10 MG/ML (PF) SYRINGE
PREFILLED_SYRINGE | INTRAVENOUS | Status: AC
  Filled 2018-05-24: qty 5

## 2018-05-24 MED ORDER — SUGAMMADEX SODIUM 200 MG/2ML IV SOLN
INTRAVENOUS | Status: AC
  Filled 2018-05-24: qty 2

## 2018-05-24 MED ORDER — PROPOFOL 10 MG/ML IV BOLUS
INTRAVENOUS | Status: DC | PRN
  Administered 2018-05-24: 100 mg via INTRAVENOUS

## 2018-05-24 MED ORDER — LIDOCAINE 2% (20 MG/ML) 5 ML SYRINGE
INTRAMUSCULAR | Status: DC | PRN
  Administered 2018-05-24: 40 mg via INTRAVENOUS

## 2018-05-24 MED ORDER — ONDANSETRON HCL 4 MG/2ML IJ SOLN: 4.0000 mg | Freq: Four times a day (QID) | INTRAMUSCULAR | Status: DC | PRN

## 2018-05-24 MED ORDER — GABAPENTIN 300 MG PO CAPS
300.0000 mg | ORAL_CAPSULE | ORAL | Status: AC
  Administered 2018-05-24: 300 mg via ORAL
  Filled 2018-05-24: qty 1

## 2018-05-24 MED ORDER — FENTANYL CITRATE (PF) 250 MCG/5ML IJ SOLN
INTRAMUSCULAR | Status: AC
  Filled 2018-05-24: qty 5

## 2018-05-24 MED ORDER — FUROSEMIDE 40 MG PO TABS
40.0000 mg | ORAL_TABLET | Freq: Two times a day (BID) | ORAL | Status: DC
  Administered 2018-05-24: 40 mg via ORAL
  Filled 2018-05-24: qty 1

## 2018-05-24 MED ORDER — CHLORHEXIDINE GLUCONATE CLOTH 2 % EX PADS: 6.0000 | MEDICATED_PAD | Freq: Once | CUTANEOUS | Status: DC

## 2018-05-24 MED ORDER — SUGAMMADEX SODIUM 200 MG/2ML IV SOLN
INTRAVENOUS | Status: DC | PRN
  Administered 2018-05-24: 300 mg via INTRAVENOUS

## 2018-05-24 MED ORDER — PHENYLEPHRINE 40 MCG/ML (10ML) SYRINGE FOR IV PUSH (FOR BLOOD PRESSURE SUPPORT)
PREFILLED_SYRINGE | INTRAVENOUS | Status: AC
  Filled 2018-05-24: qty 10

## 2018-05-24 MED ORDER — ROCURONIUM BROMIDE 10 MG/ML (PF) SYRINGE
PREFILLED_SYRINGE | INTRAVENOUS | Status: DC | PRN
  Administered 2018-05-24: 50 mg via INTRAVENOUS

## 2018-05-24 MED ORDER — PRAVASTATIN SODIUM 40 MG PO TABS: 80.0000 mg | ORAL_TABLET | Freq: Every day | ORAL | Status: DC

## 2018-05-24 MED ORDER — EPHEDRINE SULFATE-NACL 50-0.9 MG/10ML-% IV SOSY
PREFILLED_SYRINGE | INTRAVENOUS | Status: DC | PRN
  Administered 2018-05-24: 5 mg via INTRAVENOUS
  Administered 2018-05-24: 10 mg via INTRAVENOUS

## 2018-05-24 MED ORDER — PANTOPRAZOLE SODIUM 40 MG PO TBEC
40.0000 mg | DELAYED_RELEASE_TABLET | Freq: Every day | ORAL | Status: DC
  Administered 2018-05-24: 40 mg via ORAL
  Filled 2018-05-24: qty 1

## 2018-05-24 MED ORDER — SUCCINYLCHOLINE CHLORIDE 200 MG/10ML IV SOSY
PREFILLED_SYRINGE | INTRAVENOUS | Status: AC
Start: 2018-05-24 — End: ?
  Filled 2018-05-24: qty 10

## 2018-05-24 MED ORDER — ACETAMINOPHEN 500 MG PO TABS
1000.0000 mg | ORAL_TABLET | ORAL | Status: AC
  Administered 2018-05-24: 1000 mg via ORAL
  Filled 2018-05-24: qty 2

## 2018-05-24 MED ORDER — DEXAMETHASONE SODIUM PHOSPHATE 10 MG/ML IJ SOLN
INTRAMUSCULAR | Status: AC
Start: 2018-05-24 — End: ?
  Filled 2018-05-24: qty 1

## 2018-05-24 MED ORDER — DIPHENHYDRAMINE HCL 50 MG/ML IJ SOLN: 12.5000 mg | Freq: Four times a day (QID) | INTRAMUSCULAR | Status: DC | PRN

## 2018-05-24 MED ORDER — LIDOCAINE 2% (20 MG/ML) 5 ML SYRINGE
INTRAMUSCULAR | Status: AC
  Filled 2018-05-24: qty 5

## 2018-05-24 MED ORDER — DIPHENHYDRAMINE HCL 12.5 MG/5ML PO ELIX: 12.5000 mg | ORAL_SOLUTION | Freq: Four times a day (QID) | ORAL | Status: DC | PRN

## 2018-05-24 MED ORDER — METOPROLOL TARTRATE 25 MG PO TABS
25.0000 mg | ORAL_TABLET | Freq: Two times a day (BID) | ORAL | Status: DC
  Administered 2018-05-24 – 2018-05-25 (×2): 25 mg via ORAL
  Filled 2018-05-24 (×2): qty 1

## 2018-05-24 MED ORDER — PROPOFOL 10 MG/ML IV BOLUS
INTRAVENOUS | Status: AC
  Filled 2018-05-24: qty 20

## 2018-05-24 MED ORDER — FENTANYL CITRATE (PF) 250 MCG/5ML IJ SOLN
INTRAMUSCULAR | Status: DC | PRN
  Administered 2018-05-24 (×2): 50 ug via INTRAVENOUS

## 2018-05-24 MED ORDER — DEXAMETHASONE SODIUM PHOSPHATE 10 MG/ML IJ SOLN
INTRAMUSCULAR | Status: DC | PRN
  Administered 2018-05-24: 5 mg via INTRAVENOUS

## 2018-05-24 MED ORDER — BUPIVACAINE-EPINEPHRINE 0.25% -1:200000 IJ SOLN
INTRAMUSCULAR | Status: DC | PRN
  Administered 2018-05-24: 20 mL

## 2018-05-24 MED ORDER — PRAVASTATIN SODIUM 40 MG PO TABS
80.0000 mg | ORAL_TABLET | Freq: Every day | ORAL | Status: DC
  Administered 2018-05-24: 80 mg via ORAL
  Filled 2018-05-24: qty 2

## 2018-05-24 MED ORDER — ENOXAPARIN SODIUM 40 MG/0.4ML ~~LOC~~ SOLN
40.0000 mg | SUBCUTANEOUS | Status: DC
  Administered 2018-05-25: 40 mg via SUBCUTANEOUS
  Filled 2018-05-24: qty 0.4

## 2018-05-24 MED ORDER — LACTATED RINGERS IV SOLN
INTRAVENOUS | Status: DC | PRN
  Administered 2018-05-24: 07:00:00 via INTRAVENOUS

## 2018-05-24 MED ORDER — FENTANYL CITRATE (PF) 100 MCG/2ML IJ SOLN: 25.0000 ug | INTRAMUSCULAR | Status: DC | PRN

## 2018-05-24 MED ORDER — AMIODARONE HCL 200 MG PO TABS
200.0000 mg | ORAL_TABLET | Freq: Every day | ORAL | Status: DC
  Administered 2018-05-25: 200 mg via ORAL
  Filled 2018-05-24: qty 1

## 2018-05-24 MED ORDER — ONDANSETRON HCL 4 MG/2ML IJ SOLN: 4.0000 mg | Freq: Once | INTRAMUSCULAR | Status: DC | PRN

## 2018-05-24 MED ORDER — ONDANSETRON 4 MG PO TBDP: 4.0000 mg | ORAL_TABLET | Freq: Four times a day (QID) | ORAL | Status: DC | PRN

## 2018-05-24 MED ORDER — BUPIVACAINE-EPINEPHRINE (PF) 0.25% -1:200000 IJ SOLN
INTRAMUSCULAR | Status: AC
  Filled 2018-05-24: qty 30

## 2018-05-24 MED ORDER — TRAMADOL HCL 50 MG PO TABS
50.0000 mg | ORAL_TABLET | Freq: Four times a day (QID) | ORAL | Status: DC | PRN
  Administered 2018-05-24 – 2018-05-25 (×2): 50 mg via ORAL
  Filled 2018-05-24 (×2): qty 1

## 2018-05-24 MED ORDER — GABAPENTIN 300 MG PO CAPS
300.0000 mg | ORAL_CAPSULE | Freq: Two times a day (BID) | ORAL | Status: DC
  Administered 2018-05-24 – 2018-05-25 (×2): 300 mg via ORAL
  Filled 2018-05-24 (×2): qty 1

## 2018-05-24 MED ORDER — CEFAZOLIN SODIUM-DEXTROSE 2-4 GM/100ML-% IV SOLN
2.0000 g | INTRAVENOUS | Status: AC
Start: 2018-05-24 — End: 2018-05-24
  Administered 2018-05-24: 2 g via INTRAVENOUS
  Filled 2018-05-24: qty 100

## 2018-05-24 MED ORDER — ONDANSETRON HCL 4 MG/2ML IJ SOLN
INTRAMUSCULAR | Status: DC | PRN
  Administered 2018-05-24: 4 mg via INTRAVENOUS

## 2018-05-24 MED ORDER — 0.9 % SODIUM CHLORIDE (POUR BTL) OPTIME
TOPICAL | Status: DC | PRN
  Administered 2018-05-24: 1000 mL

## 2018-05-24 MED ORDER — URSODIOL 300 MG PO CAPS
300.0000 mg | ORAL_CAPSULE | Freq: Two times a day (BID) | ORAL | Status: DC
  Administered 2018-05-24: 300 mg via ORAL
  Filled 2018-05-24 (×3): qty 1

## 2018-05-24 MED ORDER — POTASSIUM CHLORIDE IN NACL 20-0.9 MEQ/L-% IV SOLN
INTRAVENOUS | Status: DC
  Administered 2018-05-24: 12:00:00 via INTRAVENOUS
  Filled 2018-05-24: qty 1000

## 2018-05-24 MED ORDER — SODIUM CHLORIDE 0.9 % IR SOLN
Status: DC | PRN
  Administered 2018-05-24: 1000 mL

## 2018-05-24 SURGICAL SUPPLY — 34 items
ADH SKN CLS APL DERMABOND .7 (GAUZE/BANDAGES/DRESSINGS) ×1
APPLIER CLIP 5 13 M/L LIGAMAX5 (MISCELLANEOUS) ×3
APR CLP MED LRG 5 ANG JAW (MISCELLANEOUS) ×1
BAG SPEC RTRVL LRG 6X4 10 (ENDOMECHANICALS) ×1
CANISTER SUCT 3000ML PPV (MISCELLANEOUS) ×3 IMPLANT
CHLORAPREP W/TINT 26ML (MISCELLANEOUS) ×3 IMPLANT
CLIP APPLIE 5 13 M/L LIGAMAX5 (MISCELLANEOUS) ×1 IMPLANT
COVER SURGICAL LIGHT HANDLE (MISCELLANEOUS) ×3 IMPLANT
DERMABOND ADVANCED (GAUZE/BANDAGES/DRESSINGS) ×2
DERMABOND ADVANCED .7 DNX12 (GAUZE/BANDAGES/DRESSINGS) ×1 IMPLANT
ELECT REM PT RETURN 9FT ADLT (ELECTROSURGICAL) ×3
ELECTRODE REM PT RTRN 9FT ADLT (ELECTROSURGICAL) ×1 IMPLANT
GLOVE SURG SIGNA 7.5 PF LTX (GLOVE) ×3 IMPLANT
GOWN STRL REUS W/ TWL LRG LVL3 (GOWN DISPOSABLE) ×2 IMPLANT
GOWN STRL REUS W/ TWL XL LVL3 (GOWN DISPOSABLE) ×1 IMPLANT
GOWN STRL REUS W/TWL LRG LVL3 (GOWN DISPOSABLE) ×6
GOWN STRL REUS W/TWL XL LVL3 (GOWN DISPOSABLE) ×3
KIT BASIN OR (CUSTOM PROCEDURE TRAY) ×3 IMPLANT
KIT TURNOVER KIT B (KITS) ×3 IMPLANT
NS IRRIG 1000ML POUR BTL (IV SOLUTION) ×3 IMPLANT
PAD ARMBOARD 7.5X6 YLW CONV (MISCELLANEOUS) ×3 IMPLANT
POUCH SPECIMEN RETRIEVAL 10MM (ENDOMECHANICALS) ×3 IMPLANT
SCISSORS LAP 5X35 DISP (ENDOMECHANICALS) ×3 IMPLANT
SET IRRIG TUBING LAPAROSCOPIC (IRRIGATION / IRRIGATOR) ×3 IMPLANT
SLEEVE ENDOPATH XCEL 5M (ENDOMECHANICALS) ×6 IMPLANT
SPECIMEN JAR SMALL (MISCELLANEOUS) ×3 IMPLANT
SUT MNCRL AB 4-0 PS2 18 (SUTURE) ×3 IMPLANT
TOWEL OR 17X24 6PK STRL BLUE (TOWEL DISPOSABLE) ×3 IMPLANT
TOWEL OR 17X26 10 PK STRL BLUE (TOWEL DISPOSABLE) ×3 IMPLANT
TRAY LAPAROSCOPIC MC (CUSTOM PROCEDURE TRAY) ×3 IMPLANT
TROCAR XCEL BLUNT TIP 100MML (ENDOMECHANICALS) ×3 IMPLANT
TROCAR XCEL NON-BLD 5MMX100MML (ENDOMECHANICALS) ×3 IMPLANT
TUBING INSUFFLATION (TUBING) ×3 IMPLANT
WATER STERILE IRR 1000ML POUR (IV SOLUTION) ×3 IMPLANT

## 2018-05-24 NOTE — Anesthesia Postprocedure Evaluation (Signed)
Anesthesia Post Note  Patient: Eric [name]  Procedure(s) Performed: LAPAROSCOPIC CHOLECYSTECTOMY (N/A Abdomen)     Patient location during evaluation: PACU Anesthesia Type: General Level of consciousness: awake and alert Pain management: pain level controlled Vital Signs Assessment: post-procedure vital signs reviewed and stable Respiratory status: spontaneous breathing, nonlabored ventilation, respiratory function stable and patient connected to nasal cannula oxygen Cardiovascular status: blood pressure returned to baseline and stable Postop Assessment: no apparent nausea or vomiting Anesthetic complications: no    Last Vitals:  Vitals:   05/24/18 2043 05/24/18 2113  BP: 121/74 121/74  Pulse: 70 70  Resp: 17   Temp: 36.6 C   SpO2: (!) 89%     Last Pain:  Vitals:   05/24/18 1832  TempSrc: Oral  PainSc:                  Heddy Vidana COKER

## 2018-05-24 NOTE — Transfer of Care (Signed)
Immediate Anesthesia Transfer of Care Note  Patient: Eric [name]  Procedure(s) Performed: LAPAROSCOPIC CHOLECYSTECTOMY (N/A Abdomen)  Patient Location: PACU  Anesthesia Type:General  Level of Consciousness: drowsy and patient cooperative  Airway & Oxygen Therapy: Patient Spontanous Breathing and Patient connected to nasal cannula oxygen  Post-op Assessment: Report given to RN, Post -op Vital signs reviewed and stable and Patient moving all extremities X 4  Post vital signs: Reviewed and stable  Last Vitals:  Vitals Value Taken Time  BP 141/71 05/24/2018  8:22 AM  Temp    Pulse 63 05/24/2018  8:24 AM  Resp 12 05/24/2018  8:24 AM  SpO2 94 % 05/24/2018  8:24 AM  Vitals shown include unvalidated device data.  Last Pain:  Vitals:   05/24/18 0609  TempSrc:   PainSc: 0-No pain      Patients Stated Pain Goal: 3 (27/74/12 8786)  Complications: No apparent anesthesia complications

## 2018-05-24 NOTE — Plan of Care (Signed)
  Problem: Pain Managment: Goal: General experience of comfort will improve Outcome: Progressing   Problem: Clinical Measurements: Goal: Postoperative complications will be avoided or minimized Outcome: Progressing   Problem: Skin Integrity: Goal: Demonstration of wound healing without infection will improve Outcome: Progressing   

## 2018-05-24 NOTE — Anesthesia Procedure Notes (Signed)
Procedure Name: Intubation Date/Time: 05/24/2018 7:46 AM Performed by: Julieta Bellini, CRNA Pre-anesthesia Checklist: Patient identified, Emergency Drugs available, Suction available and Patient being monitored Patient Re-evaluated:Patient Re-evaluated prior to induction Oxygen Delivery Method: Circle system utilized Preoxygenation: Pre-oxygenation with 100% oxygen Induction Type: IV induction Ventilation: Mask ventilation without difficulty and Oral airway inserted - appropriate to patient size Laryngoscope Size: Glidescope and 4 Grade View: Grade I Tube type: Oral Tube size: 7.5 mm Number of attempts: 1 Airway Equipment and Method: Stylet and Video-laryngoscopy Placement Confirmation: ETT inserted through vocal cords under direct vision,  positive ETCO2 and breath sounds checked- equal and bilateral Secured at: 22 cm Tube secured with: Tape Dental Injury: Teeth and Oropharynx as per pre-operative assessment  Difficulty Due To: Difficult Airway- due to reduced neck mobility

## 2018-05-24 NOTE — Op Note (Signed)

## 2018-05-24 NOTE — Plan of Care (Signed)
  Problem: Pain Managment: Goal: General experience of comfort will improve Outcome: Progressing   

## 2018-05-24 NOTE — Anesthesia Preprocedure Evaluation (Addendum)
Anesthesia Evaluation  Patient identified by MRN, date of birth, ID band Patient awake    Reviewed: Allergy & Precautions, NPO status , Patient's Chart, lab work & pertinent test results  Airway Mallampati: II  TM Distance: >3 FB Neck ROM: Full    Dental  (+) Teeth Intact, Dental Advisory Given, Partial Lower   Pulmonary former smoker,    breath sounds clear to auscultation       Cardiovascular hypertension,  Rhythm:Regular Rate:Normal     Neuro/Psych    GI/Hepatic   Endo/Other    Renal/GU      Musculoskeletal   Abdominal   Peds  Hematology   Anesthesia Other Findings   Reproductive/Obstetrics                            Anesthesia Physical Anesthesia Plan  ASA: III  Anesthesia Plan: General   Post-op Pain Management:    Induction: Intravenous  PONV Risk Score and Plan: Ondansetron and Dexamethasone  Airway Management Planned: Oral ETT and Video Laryngoscope Planned  Additional Equipment:   Intra-op Plan:   Post-operative Plan: Extubation in OR  Informed Consent: I have reviewed the patients History and Physical, chart, labs and discussed the procedure including the risks, benefits and alternatives for the proposed anesthesia with the patient or authorized representative who has indicated his/her understanding and acceptance.   Dental advisory given  Plan Discussed with: CRNA and Anesthesiologist  Anesthesia Plan Comments:         Anesthesia Quick Evaluation

## 2018-05-24 NOTE — Interval H&P Note (Signed)
History and Physical Interval Note:no change in H and P  05/24/2018 7:04 AM  Eric [name] A Boyajian  has presented today for surgery, with the diagnosis of SYMPTOMATIC GALLSTONES  The various methods of treatment have been discussed with the patient and family. After consideration of risks, benefits and other options for treatment, the patient has consented to  Procedure(s): LAPAROSCOPIC CHOLECYSTECTOMY (N/A) as a surgical intervention .  The patient's history has been reviewed, patient examined, no change in status, stable for surgery.  I have reviewed the patient's chart and labs.  Questions were answered to the patient's satisfaction.     [name]

## 2018-05-25 ENCOUNTER — Encounter (HOSPITAL_COMMUNITY): Payer: Self-pay | Admitting: Surgery

## 2018-05-25 DIAGNOSIS — K8010 Calculus of gallbladder with chronic cholecystitis without obstruction: Secondary | ICD-10-CM | POA: Diagnosis not present

## 2018-05-25 MED ORDER — TRAMADOL HCL 50 MG PO TABS: 50.0000 mg | ORAL_TABLET | Freq: Four times a day (QID) | ORAL | 0 refills | Status: DC | PRN

## 2018-05-25 NOTE — Discharge Summary (Signed)
Physician Discharge Summary  Patient ID: Javyn Havlin Hattabaugh MRN: [id number] DOB/AGE: January 27, 1932 84 y.o.  Admit date: 05/24/2018 Discharge date: 05/25/2018  Admission Diagnoses:  Discharge Diagnoses:  Active Problems:   S/P laparoscopic cholecystectomy   Discharged Condition: good  Hospital Course: uneventful post op recovery.  Discharged home pod #1  Consults: None  Significant Diagnostic Studies:   Treatments: surgery: lap chole  Discharge Exam: Blood pressure 126/78, pulse 68, temperature 97.9 F (36.6 C), temperature source Oral, resp. rate 18, height 5\' 6"  (1.676 m), weight 84.2 kg (185 lb 10 oz), SpO2 96 %. General appearance: alert, cooperative and no distress Resp: clear to auscultation bilaterally Cardio: regular rate and rhythm Incision/Wound:incision clean  Disposition: Discharge disposition: 01-Home or Self Care       Discharge Instructions    Diet - low sodium heart healthy   Complete by:  As directed    Increase activity slowly   Complete by:  As directed      Allergies as of 05/25/2018      Reactions   Morphine And Related Shortness Of Breath   Percocet [oxycodone-acetaminophen] Shortness Of Breath   Valium Shortness Of Breath      Medication List    TAKE these medications   amiodarone 200 MG tablet Commonly known as:  PACERONE TAKE 1 TABLET BY MOUTH EVERY DAY   apixaban 5 MG Tabs tablet Commonly known as:  ELIQUIS Take 1 tablet (5 mg total) by mouth 2 (two) times daily.   cetirizine 10 MG tablet Commonly known as:  ZYRTEC Take 1 tablet (10 mg total) by mouth daily. What changed:    when to take this  reasons to take this   fluticasone 50 MCG/ACT nasal spray Commonly known as:  FLONASE Place 2 sprays daily into both nostrils.   furosemide 40 MG tablet Commonly known as:  LASIX Take 40 mg 2 (two) times daily by mouth.   gabapentin 300 MG capsule Commonly known as:  NEURONTIN Take 1 capsule (300 mg total) by mouth 2 (two) times  daily.   metoprolol tartrate 25 MG tablet Commonly known as:  LOPRESSOR Take 1 tablet (25 mg total) 2 (two) times daily by mouth.   pantoprazole 40 MG tablet Commonly known as:  PROTONIX TAKE 1 TABLET BY MOUTH AT BEDTIME   potassium chloride SA 20 MEQ tablet Commonly known as:  K-DUR,KLOR-CON Take 0.5 tablets (10 mEq total) 2 (two) times daily by mouth.   pravastatin 80 MG tablet Commonly known as:  PRAVACHOL TAKE 1 TABLET BY MOUTH ONCE DAILY   traMADol 50 MG tablet Commonly known as:  ULTRAM Take 1 tablet (50 mg total) by mouth every 6 (six) hours as needed. What changed:    when to take this  reasons to take this   ursodiol 300 MG capsule Commonly known as:  ACTIGALL Take 1 capsule (300 mg total) by mouth 2 (two) times daily.   Vitamin D 1000 units capsule Take one tablet once daily      Follow-up Information    Coralie Keens, MD. Schedule an appointment as soon as possible for a visit in 3 week(s).   Specialty:  General Surgery Contact information: [address] [place] [address] [phone]           Signed: Harl Bowie 05/25/2018, 8:11 AM

## 2018-05-25 NOTE — Progress Notes (Signed)
Pt is discharged to home self care. Discharge instructions and pain med prescription given.

## 2018-05-25 NOTE — Discharge Instructions (Signed)
[facility] ______[facility] LAPAROSCOPIC SURGERY: POST OP INSTRUCTIONS Always review your discharge instruction sheet given to you by the facility where your surgery was performed. IF YOU HAVE DISABILITY OR FAMILY LEAVE FORMS, YOU MUST BRING THEM TO THE OFFICE FOR PROCESSING.   DO NOT GIVE THEM TO YOUR DOCTOR.  1. A prescription for pain medication Renwick be given to you upon discharge.  Take your pain medication as prescribed, if needed.  If narcotic pain medicine is not needed, then you Riera take acetaminophen (Tylenol) or ibuprofen (Advil) as needed. 2. Take your usually prescribed medications unless otherwise directed. 3. If you need a refill on your pain medication, please contact your pharmacy.  They will contact our office to request authorization. Prescriptions will not be filled after 5pm or on week-ends. 4. You should follow a light diet the first few days after arrival home, such as soup and crackers, etc.  Be sure to include lots of fluids daily. 5. Most patients will experience some swelling and bruising in the area of the incisions.  Ice packs will help.  Swelling and bruising can take several days to resolve.  6. It is common to experience some constipation if taking pain medication after surgery.  Increasing fluid intake and taking a stool softener (such as Colace) will usually help or prevent this problem from occurring.  A mild laxative (Milk of Magnesia or Miralax) should be taken according to package instructions if there are no bowel movements after 48 hours. 7. Unless discharge instructions indicate otherwise, you Gago remove your bandages 24-48 hours after surgery, and you Mas shower at that time.  You Prew have steri-strips (small skin tapes) in place directly over the incision.  These strips should be left on the skin for 7-10 days.  If your surgeon used skin glue on the incision, you Glendenning shower in 24 hours.  The glue will flake off over the next 2-3 weeks.  Any sutures or  staples will be removed at the office during your follow-up visit. 8. ACTIVITIES:  You Vreeland resume regular (light) daily activities beginning the next day--such as daily self-care, walking, climbing stairs--gradually increasing activities as tolerated.  You Vogl have sexual intercourse when it is comfortable.  Refrain from any heavy lifting or straining until approved by your doctor. a. You Curtiss drive when you are no longer taking prescription pain medication, you can comfortably wear a seatbelt, and you can safely maneuver your car and apply brakes. b. RETURN TO WORK:  __________________________________________________________ 9. You should see your doctor in the office for a follow-up appointment approximately 2-3 weeks after your surgery.  Make sure that you call for this appointment within a day or two after you arrive home to insure a convenient appointment time. 10. OTHER INSTRUCTIONS:OK TO SHOWER TODAY 11. ICE PACK, TYLENOL, IBUPROFEN ALSO FOR PAIN 12. RESTART BLOOD THINNING MEDICATION TODAY __________________________________________________________________________________________________________________________ __________________________________________________________________________________________________________________________ WHEN TO CALL YOUR DOCTOR: 1. Fever over 101.0 2. Inability to urinate 3. Continued bleeding from incision. 4. Increased pain, redness, or drainage from the incision. 5. Increasing abdominal pain  The clinic staff is available to answer your questions during regular business hours.  Please dont hesitate to call and ask to speak to one of the nurses for clinical concerns.  If you have a medical emergency, go to the nearest emergency room or call 911.  A surgeon from [facility] is always on call at the hospital. [address], [place], [place], [place]  [address] ? [address], [place], [place]   [address] [phone] ? [phone] ? FAX [phone]  [phone] Web site: [email or web address]

## 2018-05-25 NOTE — Progress Notes (Signed)
Patient ID: Eric [name], male   DOB: Aug 18, 1932, 84 y.o.   MRN: [id number]   Doing well Tolerating po Abdomen soft  Plan: discharge

## 2018-06-10 ENCOUNTER — Other Ambulatory Visit: Payer: Self-pay | Admitting: Nurse Practitioner

## 2018-06-10 ENCOUNTER — Other Ambulatory Visit: Payer: Self-pay | Admitting: Cardiovascular Disease

## 2018-06-21 DIAGNOSIS — I5032 Chronic diastolic (congestive) heart failure: Secondary | ICD-10-CM | POA: Diagnosis not present

## 2018-06-21 DIAGNOSIS — R0602 Shortness of breath: Secondary | ICD-10-CM | POA: Diagnosis not present

## 2018-06-29 IMAGING — CR DG CHEST 2V
1 series · 3 of 3 positions shown · non-contrast
Comparison: 10/27/2017 and prior exam

CLINICAL DATA: Acute chest pain for 1 hour.

EXAM:
CHEST  2 VIEW

[Series 1: dg chest 2 view · 0.14mm/px · 3 of 3 slices shown]
[im 1/3]
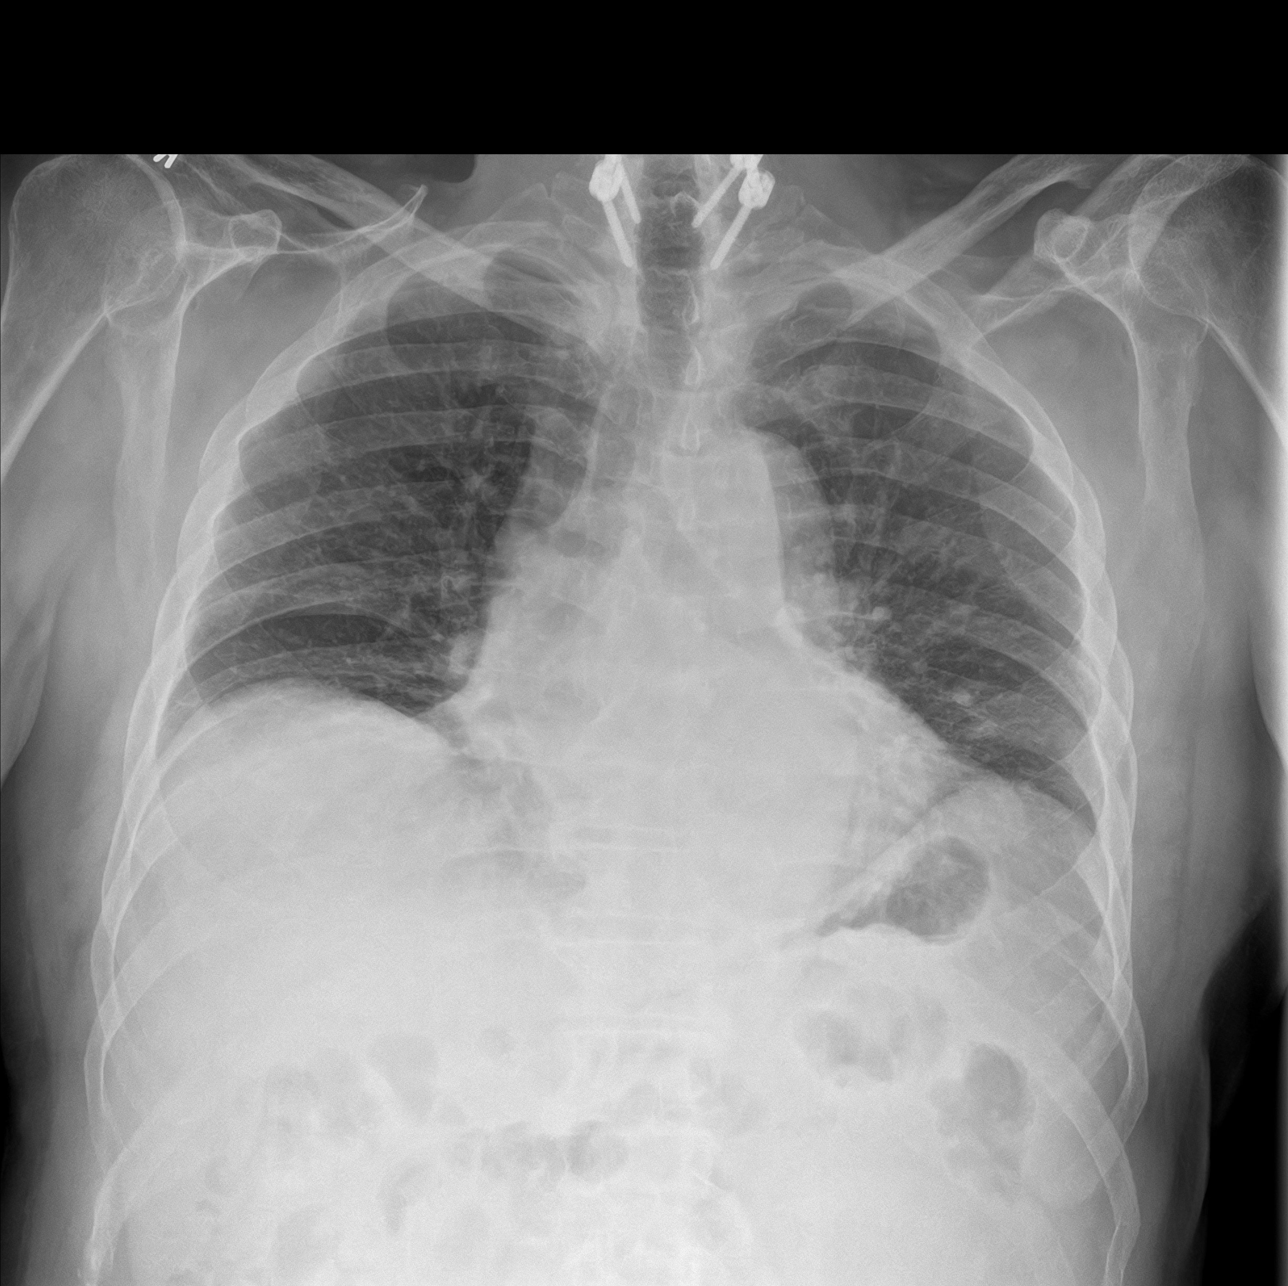
[im 2/3]
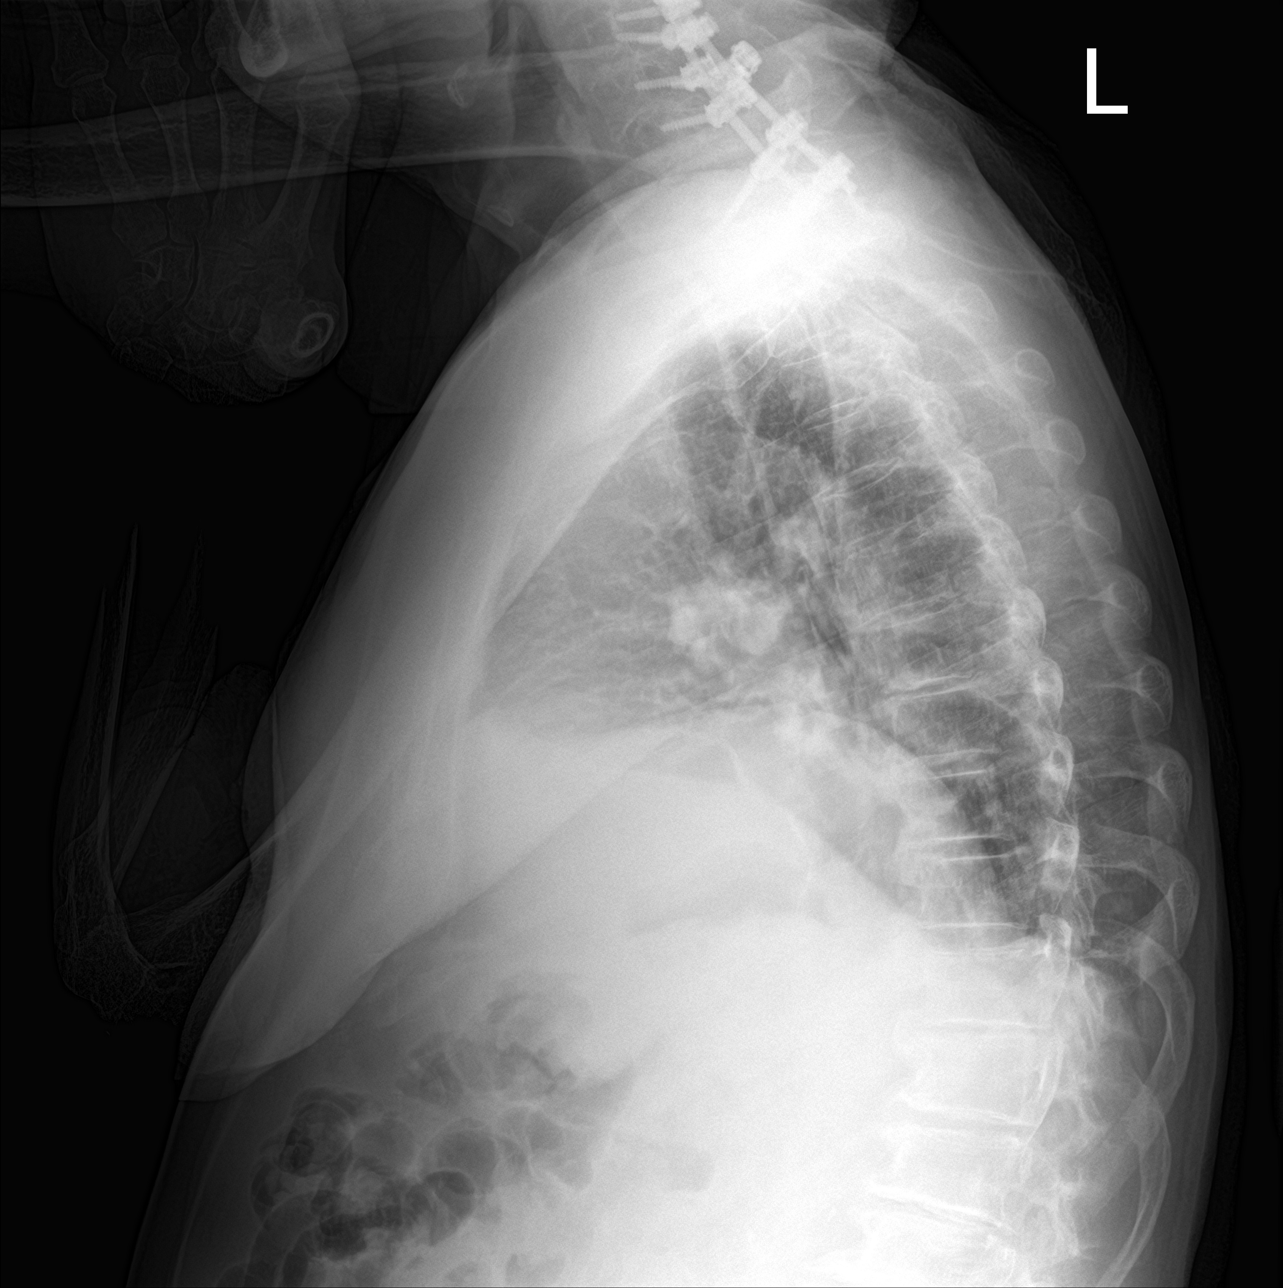
[im 3/3]
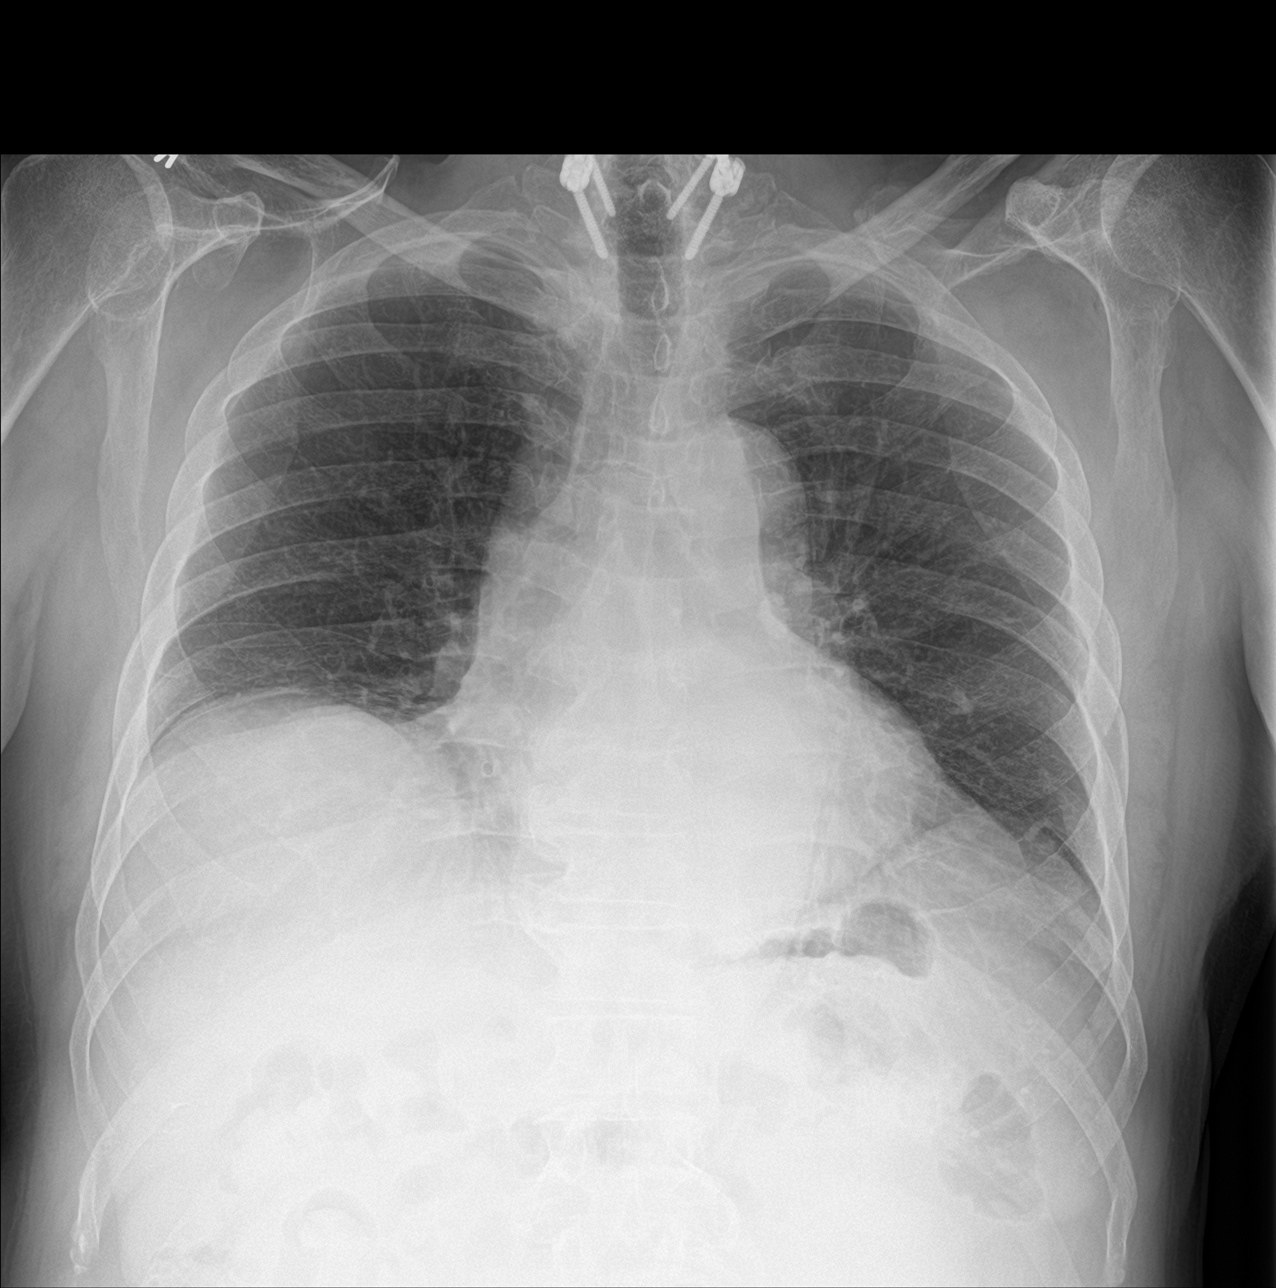

[3 of 3 positions shown; findings below may reference images not displayed]

FINDINGS: Mild cardiomegaly again noted.

Mild bibasilar atelectasis/scarring again noted.

There is no evidence of focal airspace disease, pulmonary edema,
suspicious pulmonary nodule/mass, pleural effusion, or pneumothorax.
No acute bony abnormalities are identified.

Cervical spinal fusion hardware again identified.
IMPRESSION: No evidence of acute cardiopulmonary disease.

Mild bibasilar atelectasis/ scarring again noted.

## 2018-06-29 IMAGING — CT CT ABD-PELV W/ CM
2 of 5 series · 15 of 46 positions shown, 17 images · IV contrast (APPLIED)
Comparison: Abdominal ultrasound dated 11/28/2011 and lumbar spine
CT dated 05/26/2016 and MRI dated 06/21/2016

CLINICAL DATA: 85-year-old male with chest pain and vomiting.

EXAM:
CT ABDOMEN AND PELVIS WITH CONTRAST
TECHNIQUE: Multidetector CT imaging of the abdomen and pelvis was performed
using the standard protocol following bolus administration of
intravenous contrast.
CONTRAST:  100mL [id number] IOPAMIDOL ([id number]) INJECTION 61%

[Series 2: routine abd/pel with · axial · 0.79mm/px · z∈[-429,-19]mm · 12 of 94 slices shown, 14 images]
[im 6/94  soft-tissue]
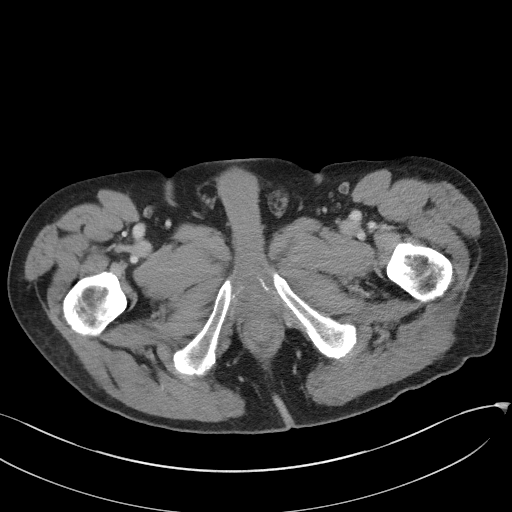
[im 6/94  bone]
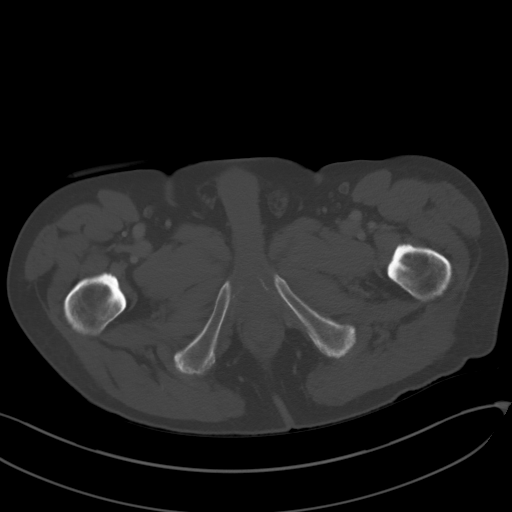
[im 17/94  soft-tissue]
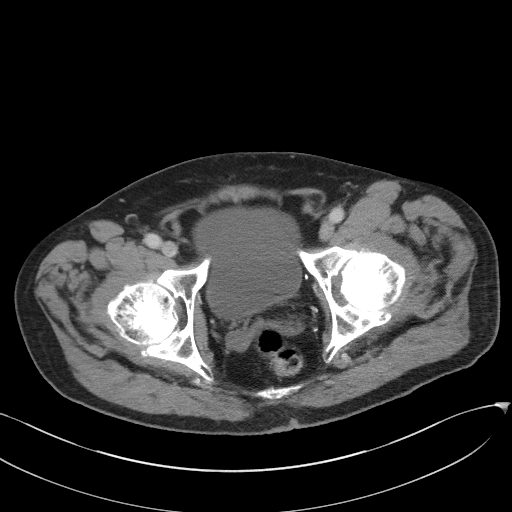
[im 22/94  soft-tissue]
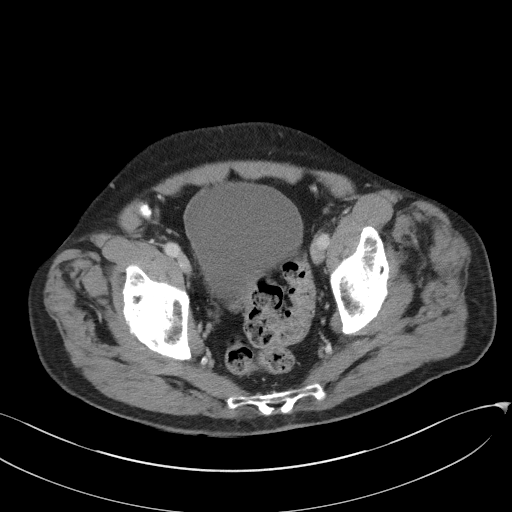
[im 28/94  soft-tissue]
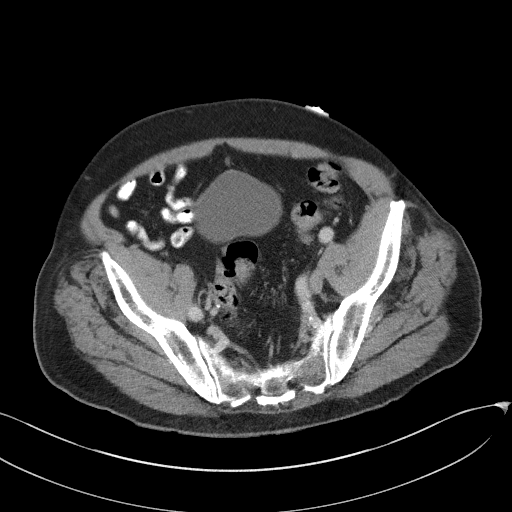
[im 39/94  soft-tissue]
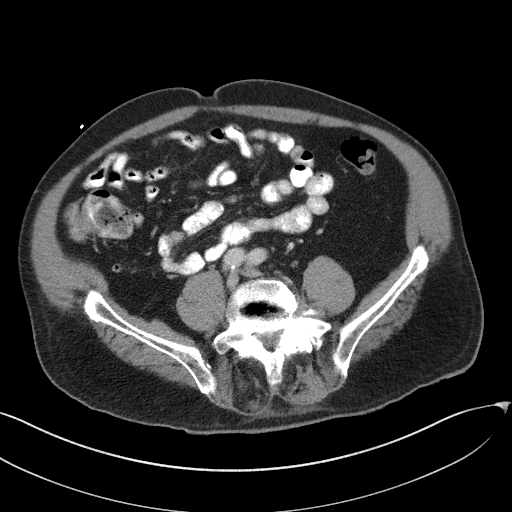
[im 44/94  soft-tissue]
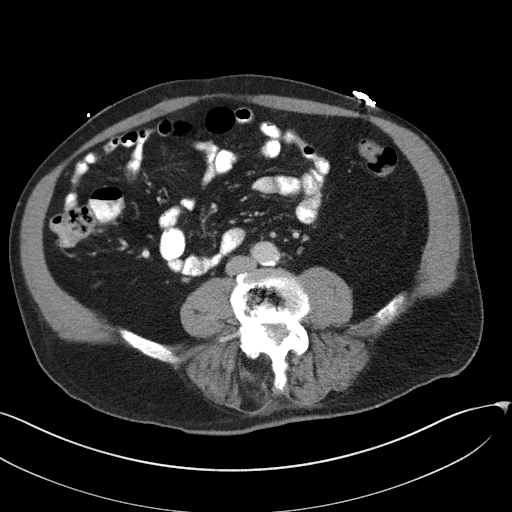
[im 50/94  soft-tissue]
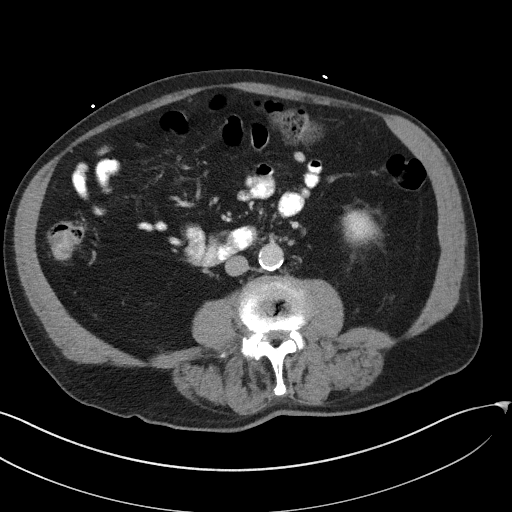
[im 61/94  soft-tissue]
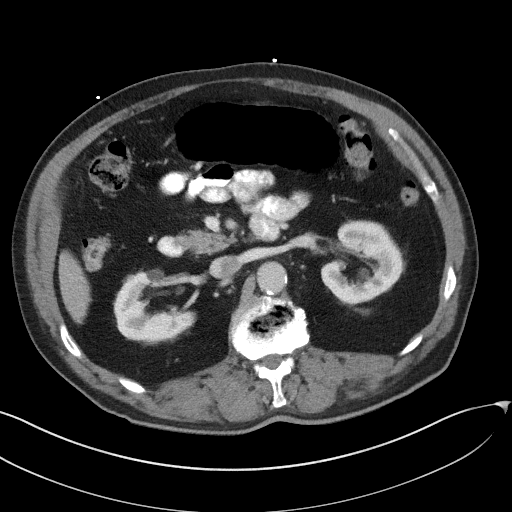
[im 66/94  soft-tissue]
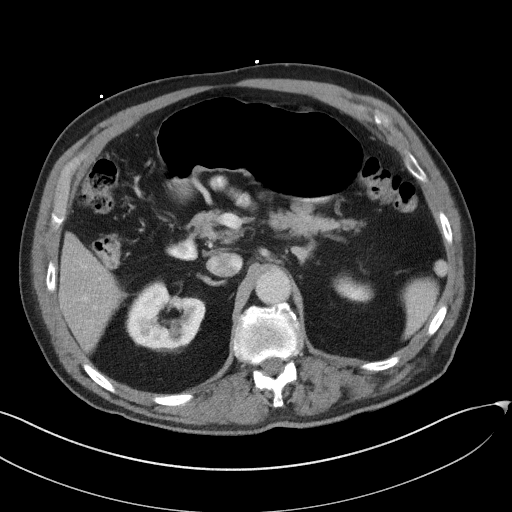
[im 66/94  bone]
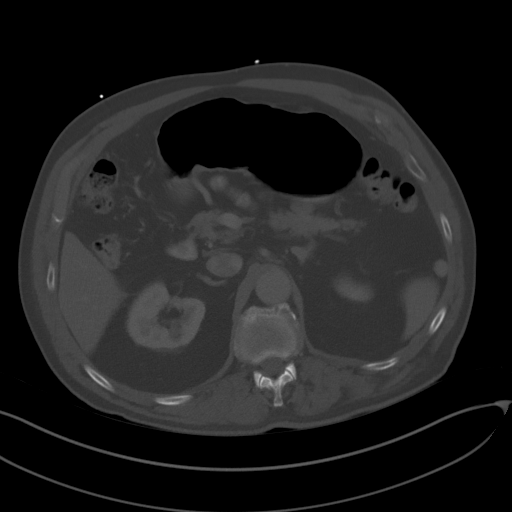
[im 72/94  soft-tissue]
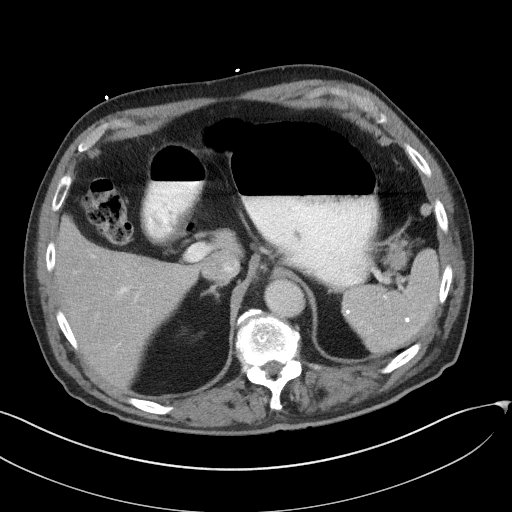
[im 83/94  soft-tissue]
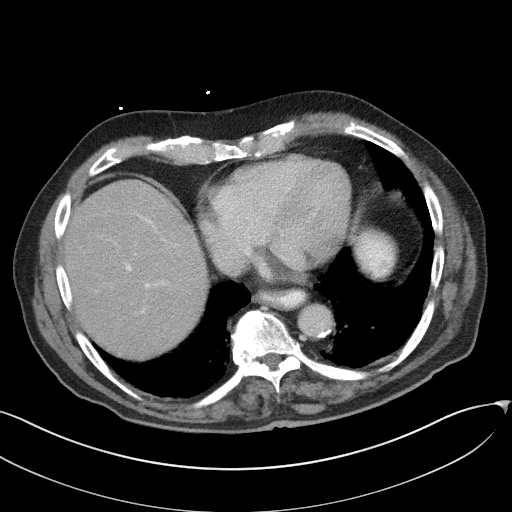
[im 88/94  soft-tissue]
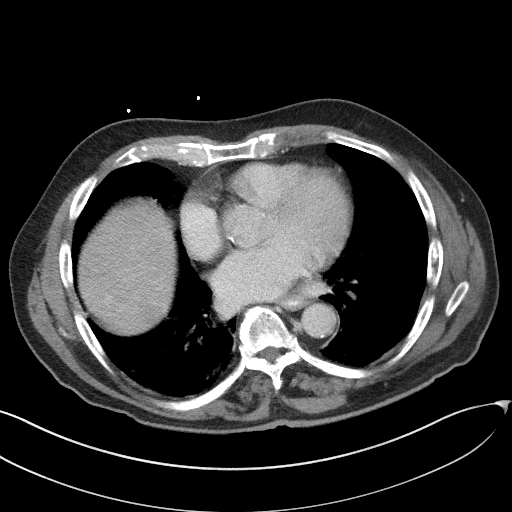

[Series 5: coronal st · coronal · 0.75mm/px · 3 of 100 slices shown]
[im 34/100  soft-tissue]
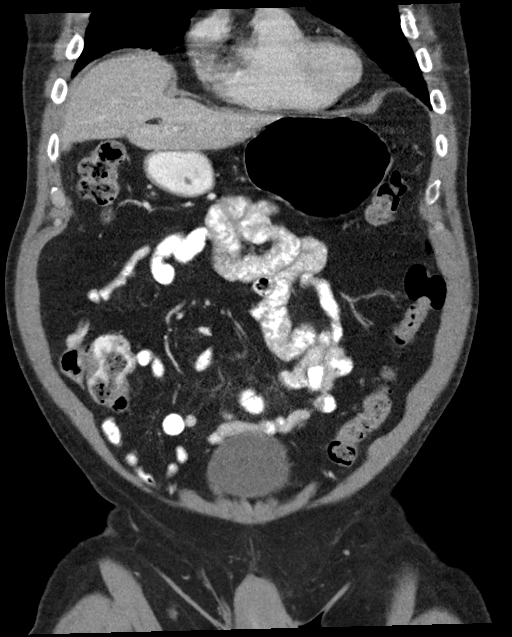
[im 45/100  soft-tissue]
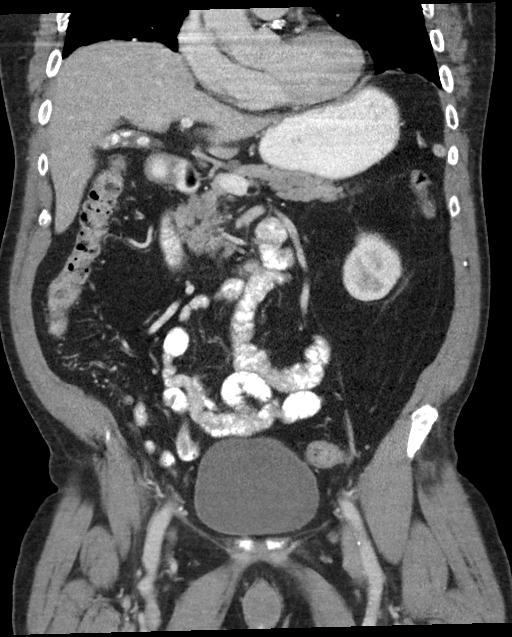
[im 56/100  soft-tissue]
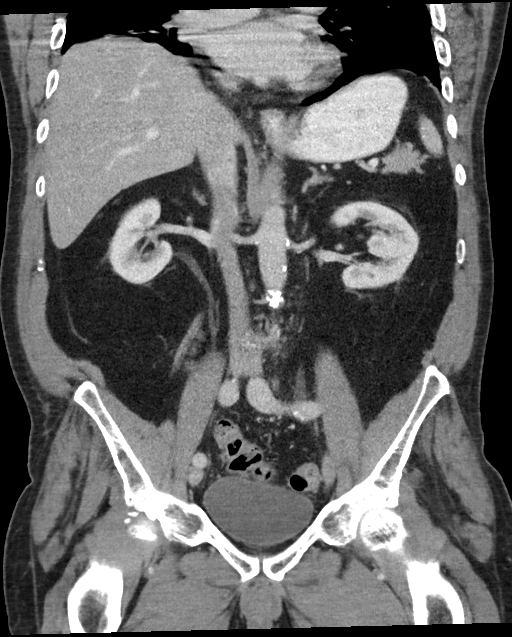

[15 of 46 positions shown; findings below may reference images not displayed]

FINDINGS: Lower chest: Minimal bibasilar dependent atelectatic changes. There
is coronary vascular calcification primarily involving the left main
and LAD. Partially visualized left hilar calcified granuloma.

No intra-abdominal free air or free fluid.

Hepatobiliary: Subcentimeter right hepatic hypodense lesion is not
well characterized but may represent a cyst or hemangioma. MRI may
provide better characterization if clinically indicated. No
intrahepatic biliary ductal dilatation. There multiple stones within
the gallbladder. No pericholecystic fluid. Ultrasound may provide
better evaluation of the gallbladder if clinically indicated.

Pancreas: Unremarkable. No pancreatic ductal dilatation or
surrounding inflammatory changes.

Spleen: Multiple small scattered calcified splenic granuloma.

Adrenals/Urinary Tract: The adrenal glands are unremarkable. There
is a 13 mm right renal inferior pole cyst. Subcentimeter bilateral
renal hypodense lesions are not well characterized but likely
represent cysts. There is no hydronephrosis on either side. The
visualized ureters and urinary bladder appear unremarkable.

Stomach/Bowel: There is sigmoid diverticulosis without active
inflammatory changes. Moderate stool noted throughout the colon.
There is no bowel obstruction or active inflammation. There is a
small hiatal hernia with gastroesophageal reflux. The appendix is
normal.

Vascular/Lymphatic: Moderate aortoiliac atherosclerotic disease.
There is no adenopathy.

Reproductive: Mild enlargement of the prostate gland with median
lobe hypertrophy.

Other: Small fat containing umbilical hernia.

Musculoskeletal: Degenerative changes of the spine as well as
osteoarthritic changes of the hips. No acute osseous pathology.
There multilevel disc desiccation with vacuum phenomena.
IMPRESSION: 1. No acute intra-abdominal or pelvic pathology.
2. Sigmoid diverticulosis, moderate colonic stool burden, and small
hiatal hernia. No bowel obstruction or active inflammation. Normal
appendix.
3. Cholelithiasis.
4.  Aortic Atherosclerosis ([id number]).

## 2018-07-10 ENCOUNTER — Other Ambulatory Visit: Payer: Self-pay | Admitting: Cardiovascular Disease

## 2018-07-11 ENCOUNTER — Telehealth: Payer: Self-pay

## 2018-07-11 NOTE — Telephone Encounter (Signed)
L MOM to call and schedule 5 month f/u

## 2018-07-11 NOTE — Telephone Encounter (Signed)
-----   Message from Anselm Pancoast, [place] sent at 07/11/2018  8:26 AM EDT ----- Please contact patient for a follow up appt. The patient was to come in June 2019.  Thanks, Ivin Booty

## 2018-07-12 NOTE — Telephone Encounter (Signed)
Pt scheduled 08/30/18   Nothing else needed

## 2018-07-12 NOTE — Telephone Encounter (Signed)
Patient daughter calling Would like to know if they can get in sooner Declined next available due to school starting and she has school bus schedule Please call to discuss - would like to speak with Olin Hauser

## 2018-07-13 DIAGNOSIS — M7022 Olecranon bursitis, left elbow: Secondary | ICD-10-CM | POA: Diagnosis not present

## 2018-07-22 DIAGNOSIS — I5032 Chronic diastolic (congestive) heart failure: Secondary | ICD-10-CM | POA: Diagnosis not present

## 2018-07-22 DIAGNOSIS — R0602 Shortness of breath: Secondary | ICD-10-CM | POA: Diagnosis not present

## 2018-08-08 ENCOUNTER — Other Ambulatory Visit: Payer: Self-pay | Admitting: Internal Medicine

## 2018-08-08 DIAGNOSIS — I5033 Acute on chronic diastolic (congestive) heart failure: Secondary | ICD-10-CM

## 2018-08-08 MED ORDER — POTASSIUM CHLORIDE CRYS ER 20 MEQ PO TBCR: 10.0000 meq | EXTENDED_RELEASE_TABLET | Freq: Two times a day (BID) | ORAL | 1 refills | Status: DC

## 2018-08-08 NOTE — Telephone Encounter (Signed)
A refill request was received for potassium 20 meq daily, however, this is incorrect.   Correct dosage is 10 meq twice daily.   A prescription has been sent to the pharmacy for the correct dosage.

## 2018-08-11 DIAGNOSIS — M7022 Olecranon bursitis, left elbow: Secondary | ICD-10-CM | POA: Diagnosis not present

## 2018-08-20 ENCOUNTER — Encounter: Payer: Self-pay | Admitting: Internal Medicine

## 2018-08-20 ENCOUNTER — Ambulatory Visit: Payer: Self-pay | Admitting: Internal Medicine

## 2018-08-20 ENCOUNTER — Ambulatory Visit (INDEPENDENT_AMBULATORY_CARE_PROVIDER_SITE_OTHER): Payer: PPO | Admitting: Internal Medicine

## 2018-08-20 VITALS — BP 138/70 | HR 56 | Temp 98.3°F | Ht 66.0 in | Wt 178.0 lb

## 2018-08-20 DIAGNOSIS — R55 Syncope and collapse: Secondary | ICD-10-CM | POA: Diagnosis not present

## 2018-08-20 DIAGNOSIS — I480 Paroxysmal atrial fibrillation: Secondary | ICD-10-CM

## 2018-08-20 DIAGNOSIS — I5032 Chronic diastolic (congestive) heart failure: Secondary | ICD-10-CM | POA: Diagnosis not present

## 2018-08-20 NOTE — Patient Instructions (Addendum)
We'll see if we can get you in sooner with Dr. Rockey Situ to make sure you're not having episodes of arrhythmia.  Your EKG shows some mildly low pulse and first degree AV block. At this rate, it normally does not make you pass out.  The other possibility is absence seizures.  We'll look into that if the cardiac workup is negative.

## 2018-08-20 NOTE — Progress Notes (Signed)
Location:  [facility] clinic Provider: Karter Hellmer L. Mariea Clonts, D.O., C.M.D.  Code Status:  FULL CODE Goals of Care:  Advanced Directives 05/24/2018  Does Patient Have a Medical Advance Directive? Yes  Type of Advance Directive Living will;Healthcare Power of Attorney  Does patient want to make changes to medical advance directive? No - Patient declined  Copy of [place] in Chart? No - copy requested  Would patient like information on creating a medical advance directive? -   Chief Complaint  Patient presents with  . Acute Visit    shortness of breath    HPI: Patient is a 84 y.o. male with h/o afib, grade 2 diastolic dysfunction, EF 85-88%, recent cholelithiasis s/p cholecystectomy, ischemic colitis, chronic DDD and stenosis with h/o surgeries followed by Dr. Lynann Bologna seen today for an acute visit for episodes of "stopping breathing while he's talking".  He says it's not really shortness of breath.  Happened 4 times today and 4 times yesterday.  Oxygen is good.  PT checked it also when he was there.  Sees cardiology next Thursday.  He feels like his chest is filing up and then he stops breathing and talking.  He doesn't look any different when it happens per his daughter. Happening for about 2 weeks.  Does not feel palpitations or fluttering.  Eyes are not closed. His son said he does not actually stop breathing.  He continues to have a dry cough which he's had since he developed CHF.  He's on oxygen at night since Nov when he passed out at home.   Weight is down.  No edema.    Appt with Dr. Rockey Situ is 9/19.  We did an EKG today:  He had first degree AV block at 56bpm.   Past Medical History:  Diagnosis Date  . Anxiety   . Arthritis   . Atrial fibrillation ([place]) 05/28/2007    Qualifier: Diagnosis of  By: Council Mechanic MD, Hilaria Ota    . Atypical chest pain    a. 05/2017 MV: no ischemia, EF 79%.  . Chest pain 10/20/2017  . Chronic diastolic CHF (congestive heart failure) ([place])    a.  01/2011 Echo: EF 50-55%, gr1 DD, mild AI, nl RV fxn, mild TR/PR; b. 10/2017 Echo: EF 60-65%, mild LVH, gr2 DD.  Marland Kitchen DDD (degenerative disc disease), cervical   . Depressive disorder, not elsewhere classified   . Difficult intubation   . Dysphagia, oral phase   . Dyspnea   . Edema   . Gallstones    Symptomatic  . History of DVT (deep vein thrombosis)   . History of kidney stones   . Hypertension   . HYPERTENSION, BENIGN ESSENTIAL 05/28/2007   Qualifier: Diagnosis of  By: Council Mechanic MD, Hilaria Ota   . Hypoxemia   . Impacted cerumen   . Long term current use of anticoagulant 03/02/2011  . LOW BACK PAIN SYNDROME 03/17/2009   Qualifier: Diagnosis of  By: Council Mechanic MD, Hilaria Ota   . Mixed hyperlipidemia   . 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 Other myelopathy   . Pain in limb   . Palpitations   . Paroxysmal Atrial Fibrillation ([place])    a. a. 01/2011 in setting of post-op complications including aspiration pna;  b. CHA2DS2VASc = 4--> Amio/Eliquis.  . Pneumonia 03/06/2003  . Spinal stenosis, unspecified region other than cervical   . Squamous cell carcinoma of skin of trunk, except scrotum    skin cancer  of shoulder  . Syncope    a. 10/2017-->Event monitor: RSR, rare PACs/PVCs.  . Syncope and collapse 10/20/2017  . Thoracic or lumbosacral neuritis or radiculitis, unspecified     Past Surgical History:  Procedure Laterality Date  . CARDIOVERSION N/A 06/20/2017   Procedure: CARDIOVERSION;  Surgeon: [name], MD;  Location: [facility];  Service: Cardiovascular;  Laterality: N/A;  . CATARACT EXTRACTION W/ INTRAOCULAR LENS  IMPLANT, BILATERAL    . CERVICAL FUSION  02/10/2011  . CHOLECYSTECTOMY  05/24/2018  . CHOLECYSTECTOMY N/A 05/24/2018   Procedure: LAPAROSCOPIC CHOLECYSTECTOMY;  Surgeon: Coralie Keens, MD;  Location: [place];  Service: General;  Laterality: N/A;  . COLONOSCOPY WITH PROPOFOL N/A 02/20/2018   Procedure: COLONOSCOPY WITH PROPOFOL;  Surgeon: Lucilla Lame, MD;  Location: [facility];  Service: Endoscopy;  Laterality: N/A;  . history of abd ultrasound  11/01   fatty liver  . MULTIPLE TOOTH EXTRACTIONS    . ORIF FIBULA FRACTURE Left 01/06/2017   Procedure: OPEN REDUCTION INTERNAL FIXATION (ORIF) FIBULA FRACTURE DISTAL FIBULA;  Surgeon: Melrose Nakayama, MD;  Location: [place];  Service: Orthopedics;  Laterality: Left;  Patient states has problems if he will have a tube in throat for Genera; Anesthesia    Allergies  Allergen Reactions  . Morphine And Related Shortness Of Breath  . Percocet [Oxycodone-Acetaminophen] Shortness Of Breath  . Valium Shortness Of Breath    Outpatient Encounter Medications as of 08/20/2018  Medication Sig  . amiodarone (PACERONE) 200 MG tablet TAKE 1 TABLET BY MOUTH EVERY DAY  . apixaban (ELIQUIS) 5 MG TABS tablet Take 1 tablet (5 mg total) by mouth 2 (two) times daily.  . cetirizine (ZYRTEC) 10 MG tablet TAKE 1 TABLET BY MOUTH ONCE DAILY  . Cholecalciferol (VITAMIN D) 1000 UNITS capsule Take one tablet once daily  . fluticasone (FLONASE) 50 MCG/ACT nasal spray Place 2 sprays daily into both nostrils.  . furosemide (LASIX) 40 MG tablet Take 40 mg 2 (two) times daily by mouth.  . gabapentin (NEURONTIN) 300 MG capsule Take 1 capsule (300 mg total) by mouth 2 (two) times daily.  . metoprolol tartrate (LOPRESSOR) 25 MG tablet Take 1 tablet (25 mg total) 2 (two) times daily by mouth.  . pantoprazole (PROTONIX) 40 MG tablet TAKE 1 TABLET BY MOUTH AT BEDTIME  . potassium chloride SA (K-DUR,KLOR-CON) 20 MEQ tablet Take 0.5 tablets (10 mEq total) by mouth 2 (two) times daily.  . pravastatin (PRAVACHOL) 80 MG tablet TAKE 1 TABLET BY MOUTH ONCE DAILY  . traMADol (ULTRAM) 50 MG tablet Take 1 tablet (50 mg total) by mouth every 6 (six) hours as needed.  . ursodiol (ACTIGALL) 300 MG capsule Take 1 capsule (300 mg total) by mouth 2 (two) times daily.  . [DISCONTINUED] amiodarone (PACERONE) 200 MG tablet TAKE 1 TABLET BY MOUTH  EVERY DAY   No facility-administered encounter medications on file as of 08/20/2018.     Review of Systems:  Review of Systems  Constitutional: Negative for chills, fever and malaise/fatigue.  Eyes:       Glasses  Respiratory: Negative for cough, shortness of breath and wheezing.   Cardiovascular: Negative for chest pain, palpitations, orthopnea, claudication, leg swelling and PND.       Full feeling in chest before episodes of "not breathing" for a few seconds; no true pain; no actually feeling of being short of breath  Gastrointestinal: Negative for abdominal pain.  Genitourinary: Negative for dysuria.  Musculoskeletal: Negative for falls.  Skin: Negative for  itching and rash.  Neurological: Positive for loss of consciousness.       His daughter says he is NOT staring during it, but also said it happened when she was driving him in here so she did not know what he looked like; no history of seizures; daughter insists "he does not pass out" but it's seconds long  Psychiatric/Behavioral: Positive for depression. Negative for memory loss. The patient is not nervous/anxious and does not have insomnia.     Health Maintenance  Topic Date Due  . INFLUENZA VACCINE  07/12/2018  . COLONOSCOPY  02/20/2021  . TETANUS/TDAP  10/15/2023  . PNA vac Low Risk Adult  Completed    Physical Exam: Vitals:   08/20/18 1048  BP: 138/70  Pulse: (!) 56  Temp: 98.3 F (36.8 C)  TempSrc: Oral  SpO2: 96%  Weight: 178 lb (80.7 kg)  Height: 5\' 6"  (1.676 m)   Body mass index is 28.73 kg/m. Physical Exam  Constitutional: He is oriented to person, place, and time. He appears well-developed and well-nourished. No distress.  HENT:  Head: Normocephalic and atraumatic.  Eyes:  glasses  Cardiovascular: Normal rate, regular rhythm and intact distal pulses.  Murmur heard. Pulmonary/Chest: Effort normal and breath sounds normal. No respiratory distress.  Abdominal: Bowel sounds are normal.    Musculoskeletal: Normal range of motion.  Uses rolling walker with skis  Neurological: He is alert and oriented to person, place, and time.  Skin: Skin is warm and dry.    Labs reviewed: Basic Metabolic Panel: Recent Labs    02/18/18 1601 02/19/18 0415 05/24/18 0634  NA 142 140 140  K 4.1 3.6 3.9  CL 108 109 104  CO2 27 22 27   GLUCOSE 108* 86 90  BUN 24* 20 21*  CREATININE 1.28* 1.23 1.40*  CALCIUM 8.9 8.1* 9.1   Liver Function Tests: Recent Labs    11/12/17 2025 01/08/18 1152 02/18/18 1601  AST 47* 56* 46*  ALT 46 62 44  ALKPHOS 55 43 45  BILITOT 0.6 0.8 0.8  PROT 7.7 7.3 7.1  ALBUMIN 4.0 4.1 4.2   Recent Labs    11/12/17 2025 01/08/18 1152 02/18/18 1725  LIPASE 38  --  45  AMYLASE  --  83  --    No results for input(s): AMMONIA in the last 8760 hours. CBC: Recent Labs    10/20/17 1410 10/27/17 1055  02/19/18 0415  02/20/18 0424  02/21/18 1008 02/21/18 1547 05/22/18 1101  WBC 5.5 5.3   < > 6.5  --  6.1  --   --   --  6.7  NEUTROABS 3.0 2,894  --   --   --   --   --   --   --   --   HGB 13.5 13.0*   < > 13.4   < > 13.4   < > 13.9 14.3 15.2  HCT 41.2 37.8*   < > 39.9*  --  40.0  --   --   --  47.6  MCV 98.8 95.2   < > 96.2  --  97.1  --   --   --  98.6  PLT 186 200   < > 161  --  148*  --   --   --  202   < > = values in this interval not displayed.   Lipid Panel: Recent Labs    09/01/17 1015  CHOL 187  HDL 34*  LDLCALC 113*  TRIG 303*  CHOLHDL 5.5*   Lab Results  Component Value Date   HGBA1C 5.7 (H) 05/16/2016    Assessment/Plan 1. Syncope, unspecified syncope type - seems to me that he is having very brief syncopal episodes that are seconds long,--fits more with his history than new seizures at 84 yo -he has a significant fh/o strokes, also but it does not fit that either as he just has periods of not talking, but no weakness or numbness or visual changes -will get him in with cardiology earlier to see if he needs another monitor -  EKG 12-Lead today with first degree aV block at 56bpm  2. Chronic diastolic CHF (congestive heart failure) (HCC) -stable, no signs of acute chf with stable weight and no symptoms to suggest this, cont lasix and potassium  3. 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    Labs/tests ordered:  Orders Placed This Encounter  Procedures  . EKG 12-Lead    Next appt:  Visit date not found  Amea Mcphail L. Jaykub Mackins, D.O. [facility] [address], [place] [address] Cell Phone (Mon-Fri 8am-5pm):  [phone] On Call:  [phone] & follow prompts after 5pm & weekends Office Phone:  [phone] Office Fax:  [phone]

## 2018-08-22 DIAGNOSIS — I5032 Chronic diastolic (congestive) heart failure: Secondary | ICD-10-CM | POA: Diagnosis not present

## 2018-08-22 DIAGNOSIS — R0602 Shortness of breath: Secondary | ICD-10-CM | POA: Diagnosis not present

## 2018-08-30 ENCOUNTER — Ambulatory Visit (INDEPENDENT_AMBULATORY_CARE_PROVIDER_SITE_OTHER): Payer: PPO

## 2018-08-30 ENCOUNTER — Ambulatory Visit (INDEPENDENT_AMBULATORY_CARE_PROVIDER_SITE_OTHER): Payer: PPO | Admitting: Nurse Practitioner

## 2018-08-30 ENCOUNTER — Encounter: Payer: Self-pay | Admitting: Nurse Practitioner

## 2018-08-30 VITALS — BP 152/72 | HR 55 | Ht 66.0 in | Wt 180.0 lb

## 2018-08-30 DIAGNOSIS — I5032 Chronic diastolic (congestive) heart failure: Secondary | ICD-10-CM | POA: Diagnosis not present

## 2018-08-30 DIAGNOSIS — R42 Dizziness and giddiness: Secondary | ICD-10-CM

## 2018-08-30 DIAGNOSIS — I480 Paroxysmal atrial fibrillation: Secondary | ICD-10-CM | POA: Diagnosis not present

## 2018-08-30 DIAGNOSIS — I10 Essential (primary) hypertension: Secondary | ICD-10-CM | POA: Diagnosis not present

## 2018-08-30 DIAGNOSIS — R55 Syncope and collapse: Secondary | ICD-10-CM | POA: Diagnosis not present

## 2018-08-30 DIAGNOSIS — R1310 Dysphagia, unspecified: Secondary | ICD-10-CM | POA: Diagnosis not present

## 2018-08-30 NOTE — Progress Notes (Signed)
Office Visit    Patient Name: Worth Kober Ruscitti Date of Encounter: 08/30/2018  Primary Care Provider:  [name], DO Primary Cardiologist:  Ida Rogue, MD  Chief Complaint    84 y.o. Male with a history of HFrEF (subsequently normalized), hypertension, Paroxysmal Atrial Fibrilation, syncope, atypical chest pain, skin cancer, ischemic colitis and DVT's, presents to office today for follow up of pre-syncopal/absence episodes.   Past Medical History    Past Medical History:  Diagnosis Date  . Anxiety   . Arthritis   . Atypical chest pain    a. 05/2017 MV: no ischemia, EF 79%.  . Chest pain 10/20/2017  . Chronic diastolic CHF (congestive heart failure) ([place])    a. 01/2011 Echo: EF 50-55%, gr1 DD, mild AI, nl RV fxn, mild TR/PR; b. 10/2017 Echo: EF 60-65%, mild LVH, gr2 DD.  Marland Kitchen DDD (degenerative disc disease), cervical   . Depressive disorder, not elsewhere classified   . Difficult intubation   . Dysphagia, oral phase   . Dyspnea   . Edema   . Gallstones    a. Symptomatic - s/p lap chole 05/2018.  Marland Kitchen History of DVT (deep vein thrombosis)   . History of kidney stones   . Hypertension   . Hypoxemia   . Impacted cerumen   . Ischemic colitis ([place])    a. 02/2018 GIB - colonoscopy w/ isch colitis. Anticoagulation resumed.  . Long term current use of anticoagulant 03/02/2011  . LOW BACK PAIN SYNDROME 03/17/2009   Qualifier: Diagnosis of  By: Council Mechanic MD, Hilaria Ota   . Mixed hyperlipidemia   . 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 Other myelopathy   . Pain in limb   . Palpitations   . Paroxysmal Atrial Fibrillation ([place])    a. a. 01/2011 in setting of post-op complications including aspiration pna;  b. CHA2DS2VASc = 4--> Amio/Eliquis.  . Pneumonia 03/06/2003  . Spinal stenosis, unspecified region other than cervical   . 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 Syncope    a. 10/2017-->Event monitor: RSR, rare  PACs/PVCs.  . Thoracic or lumbosacral neuritis or radiculitis, unspecified    Past Surgical History:  Procedure Laterality Date  . CARDIOVERSION N/A 06/20/2017   Procedure: CARDIOVERSION;  Surgeon: [name], MD;  Location: [facility];  Service: Cardiovascular;  Laterality: N/A;  . CATARACT EXTRACTION W/ INTRAOCULAR LENS  IMPLANT, BILATERAL    . CERVICAL FUSION  02/10/2011  . CHOLECYSTECTOMY  05/24/2018  . CHOLECYSTECTOMY N/A 05/24/2018   Procedure: LAPAROSCOPIC CHOLECYSTECTOMY;  Surgeon: Coralie Keens, MD;  Location: [place];  Service: General;  Laterality: N/A;  . COLONOSCOPY WITH PROPOFOL N/A 02/20/2018   Procedure: COLONOSCOPY WITH PROPOFOL;  Surgeon: Lucilla Lame, MD;  Location: [facility];  Service: Endoscopy;  Laterality: N/A;  . history of abd ultrasound  11/01   fatty liver  . MULTIPLE TOOTH EXTRACTIONS    . ORIF FIBULA FRACTURE Left 01/06/2017   Procedure: OPEN REDUCTION INTERNAL FIXATION (ORIF) FIBULA FRACTURE DISTAL FIBULA;  Surgeon: Melrose Nakayama, MD;  Location: [place];  Service: Orthopedics;  Laterality: Left;  Patient states has problems if he will have a tube in throat for Genera; Anesthesia    Allergies  Allergies  Allergen Reactions  . Morphine And Related Shortness Of Breath  . Percocet [Oxycodone-Acetaminophen] Shortness Of Breath  . Valium Shortness Of Breath    History of Present  Illness    Mr. Weiskopf is an 84 y.o. Male with a history of HFrEF, hypertension, hyperlipidemia, paroxysmal atrial fibrilation, syncope, atypical chest pain, skin cancer, ischemic colitis and DVT's. He was last seen out office in January, 2019 following hospital admission for a syncopal episode at home in November, 2018. His stress test in June of 2018 was negative, and did not have any events on telemetry during hospital stay, echo at that time showed normal EF of 60-65%, mild LVH and grade 2 diastolic dysfunction. Was sent home on Event monitor for one month that only showed rare  PVC's and PAC's. PAF has been controlled with amiodarone and Eliquis, prior EKG's showing maintained sinus brachycardia with 1st degree AV block.   Since that time he has been in generally good health until three weeks ago when he began having periods where he would "stop breathing while talking". He was initially seen by his PCP on 08/20/2018 who referred him back to cardiology for evaluation. He reports that these episodes began about three weeks ago, with the first episode occurring while riding in the car with his daughter. She noticed that he stopped speaking mid-sentence and appeared to not breathe for a moment. The patient was aware of the event, states that he did not lose his vision or hearing and was aware of his surroundings. The episodes last for only a few seconds and then he is able to take a breath and continue talking normally. He states that they are happening almost everyday, multiple times a day. Denies dizziness, loss of consciousness, vision, hearing, or chest pain. Denies any changes with balance, coordination or strength. He states that these episodes are different in presentation than his syncopal episode last November. Reports an increase in SOB in the last few weeks, primarily with walking but not every time he exerts himself. He is able to do his daily 15 minute exercise routine, which consists of calf raises, leg raises and squats; without SOB. He also reported an incident last week when he woke up in the morning his throat felt tight, and difficult to swallow but resolved within a few minutes. He has noticed an increase in coughing while eating and states he feels like he is having increased difficulty swallowing. He is able to converse freely in the office today, but states that he has already had two episodes this morning, one of which occurred while checking in for today's appointment.   Home Medications    Prior to Admission medications   Medication Sig Start Date End Date  Taking? Authorizing Provider  amiodarone (PACERONE) 200 MG tablet TAKE 1 TABLET BY MOUTH EVERY DAY 07/11/18   [name], MD  apixaban (ELIQUIS) 5 MG TABS tablet Take 1 tablet (5 mg total) by mouth 2 (two) times daily. 04/24/17   [name], MD  cetirizine (ZYRTEC) 10 MG tablet TAKE 1 TABLET BY MOUTH ONCE DAILY 06/11/18   Lauree Chandler, NP  Cholecalciferol (VITAMIN D) 1000 UNITS capsule Take one tablet once daily 08/09/13   Reed, [name] L, DO  fluticasone (FLONASE) 50 MCG/ACT nasal spray Place 2 sprays daily into both nostrils. 10/27/17 10/27/18  Lauree Chandler, NP  furosemide (LASIX) 40 MG tablet Take 40 mg 2 (two) times daily by mouth.    [provider]  gabapentin (NEURONTIN) 300 MG capsule Take 1 capsule (300 mg total) by mouth 2 (two) times daily. 01/11/18   Reed, [name] L, DO  metoprolol tartrate (LOPRESSOR) 25 MG tablet Take  1 tablet (25 mg total) 2 (two) times daily by mouth. 10/21/17 10/21/18  Georgette Shell, MD  pantoprazole (PROTONIX) 40 MG tablet TAKE 1 TABLET BY MOUTH AT BEDTIME 05/10/18   Reed, [name] L, DO  potassium chloride SA (K-DUR,KLOR-CON) 20 MEQ tablet Take 0.5 tablets (10 mEq total) by mouth 2 (two) times daily. 08/08/18   Reed, [name] L, DO  pravastatin (PRAVACHOL) 80 MG tablet TAKE 1 TABLET BY MOUTH ONCE DAILY 05/10/18   Reed, [name] L, DO  traMADol (ULTRAM) 50 MG tablet Take 1 tablet (50 mg total) by mouth every 6 (six) hours as needed. 05/25/18   Coralie Keens, MD  ursodiol (ACTIGALL) 300 MG capsule Take 1 capsule (300 mg total) by mouth 2 (two) times daily. 03/15/18   Virgel Manifold, MD    Review of Systems    Denies chest pain, palpitations, dizziness.  Occas doe.  ? Presyncope as above. All other systems reviewed and are otherwise negative except as noted above.  Physical Exam    VS:  BP (!) 152/72 (BP Location: Left Arm, Patient Position: Sitting, Cuff Size: Normal)   Pulse (!) 55   Ht 5\' 6"  (1.676 m)   Wt 180 lb (81.6  kg)   SpO2 95%   BMI 29.05 kg/m  , BMI Body mass index is 29.05 kg/m. GEN: Well nourished, well developed, in no acute distress. HEENT: normal. Neck: Supple, no JVD, carotid bruits, or masses. Cardiac: RRR, no murmurs, rubs, or gallops. No clubbing, cyanosis. +1 edema BLE.  Radials/DP/PT 2+ and equal bilaterally.  Respiratory:  Respirations regular and unlabored, clear to auscultation bilaterally. GI: Soft, nontender, nondistended, BS + x 4. MS: no deformity or atrophy. Skin: warm and dry, no rash. Neuro:  Strength and sensation are intact. Psych: Normal affect.  Accessory Clinical Findings    ECG personally reviewed by me today - Sinus Bradycardia with 1 degree AV block, rate 55 - no acute changes.  Assessment & Plan    1.  Pre-Syncope (?): 3 week history of absence-like episodes while talking.  He says that during these spells, he has no loss of consciousness, is aware of surroundings and oriented to where he's at.  He is aware that he is not able to get his words out when these spells occur, and the a few seconds later, he is able to respond again.  These spells have been witnessed by family members.  He has never fallen/slumped or otherwise moved in any way to indicate that he had lost consciousness.  No palpitations or lightheadedness reported.  Episodes are occurring multiple times a day, almost every day.  Given previous history of syncopal episode, PAF, HTN, HLD, HFrEF we will plan for a Zio monitor for a week to detect any possible arrhythmia or pauses that [place] be contributing to these symptoms. If monitor does not show any abnormalities would recommend referral to neurology for further work up.   2. Paroxysmal Atrial Fibrillation: Currently in sinus rhythm, will continue amio and Eliquis. Zio monitor for a  week to determine if a-fib, pauses or other arrhythmias are occurring.  Cont eliquis for CHA2DS2VASc = 4.  3. Chronic Diastolic Heart Failure: Mild ankle edema on exam, which  he and his wife say has been present from time to time.  He sits much of the day and is aware that he needs to keep his legs elevated whenever possible.  Cont 40mg  lasix daily. Last echo in 10/2017 EF 60-65%, mild LVH, and grade 2  diastolic dysfunction.  HR stable.  BP elevated, though pt says that he trends better @ home.  I've asked him to record BP's over the next week and contact us w/ values.  With these spells in #1, I'm a little reluctant to change bp meds at this time.  4. Essential Hypertension: BP mildly elevated in office today, most recent readings in PCP office last week was WNL. Will defer changes to BP meds today in setting of possible syncope. Will continue lopressor 25 mg BID.   5. Dysphagia:  Pt says that he's been coughing for a long time and has also been noticing that he is prone to choking on his food.  I will refer to speech therapy for eval.  6.  Atypical c/p:  Pt with prior h/o atyp c/p.  Neg MV in 2018.  No recent chest pain but did have a solitary episode of chest tightness. He also has had intermittent DOE w/o rhyme or reason as to when it might occur, as he is able to complete 15 mins of exercises in the AM w/o limitations. As above, placing monitor to assess for possible arrhythmia as a cause for sporadic symptoms.  7.  Disposition: will set up with Zio monitor today. Follow up in 4-6 weeks or sooner if necessary.  Murray Hodgkins, NP 08/30/2018, 11:38 AM

## 2018-08-30 NOTE — Patient Instructions (Signed)
Medication Instructions:  Your physician recommends that you continue on your current medications as directed. Please refer to the Current Medication list given to you today.   Labwork: none  Testing/Procedures: Your physician has recommended that you wear an event monitor. Event monitors are medical devices that record the heart's electrical activity. Doctors most often [place] these monitors to diagnose arrhythmias. Arrhythmias are problems with the speed or rhythm of the heartbeat. The monitor is a small, portable device. You can wear one while you do your normal daily activities. This is usually used to diagnose what is causing palpitations/syncope (passing out). A Zio Patch Event Heart monitor will be applied to your chest today.  You will wear the patch for 7 days. After 24 hours, you Tailor shower with the heart monitor on. If you feel any symptoms, you [place] press and release the button in the middle of the monitor.  REMOVE on 09/06/18.   Follow-Up: You have been referred to OUTPATIENT SPEECH THERAPY. Someone will call you to arrange your appointment.   Your physician recommends that you schedule a follow-up appointment in: [place].   If you need a refill on your cardiac medications before your next appointment, please call your pharmacy.

## 2018-09-05 ENCOUNTER — Other Ambulatory Visit: Payer: Self-pay | Admitting: *Deleted

## 2018-09-05 DIAGNOSIS — R1310 Dysphagia, unspecified: Secondary | ICD-10-CM

## 2018-09-10 ENCOUNTER — Other Ambulatory Visit: Payer: Self-pay | Admitting: Internal Medicine

## 2018-09-10 ENCOUNTER — Other Ambulatory Visit: Payer: Self-pay | Admitting: Cardiovascular Disease

## 2018-09-13 ENCOUNTER — Ambulatory Visit
Admission: RE | Admit: 2018-09-13 | Discharge: 2018-09-13 | Disposition: A | Discharge status: Home / Self Care | Payer: PPO | Source: Ambulatory Visit | Attending: Nurse Practitioner | Admitting: Nurse Practitioner

## 2018-09-13 DIAGNOSIS — R1314 Dysphagia, pharyngoesophageal phase: Secondary | ICD-10-CM

## 2018-09-13 DIAGNOSIS — R1310 Dysphagia, unspecified: Secondary | ICD-10-CM

## 2018-09-13 DIAGNOSIS — R05 Cough: Secondary | ICD-10-CM | POA: Diagnosis not present

## 2018-09-13 DIAGNOSIS — R55 Syncope and collapse: Secondary | ICD-10-CM | POA: Diagnosis not present

## 2018-09-13 NOTE — Therapy (Addendum)
[facility] [place], [place], [address] Phone: [phone]   Fax:     Modified Barium Swallow  Patient Details  Name: Eric [name] MRN: [id number] Date of Birth: Jul 07, 1932 No data recorded  Encounter Date: 09/13/2018  End of Session - 09/13/18 1610    Visit Number  1    Number of Visits  1    Date for SLP Re-Evaluation  09/13/18    SLP Start Time  86    SLP Stop Time   1400    SLP Time Calculation (min)  60 min    Activity Tolerance  Patient tolerated treatment well       Past Medical History:  Diagnosis Date  . Anxiety   . Arthritis   . Atypical chest pain    a. 05/2017 MV: no ischemia, EF 79%.  . Chest pain 10/20/2017  . Chronic diastolic CHF (congestive heart failure) ([place])    a. 01/2011 Echo: EF 50-55%, gr1 DD, mild AI, nl RV fxn, mild TR/PR; b. 10/2017 Echo: EF 60-65%, mild LVH, gr2 DD.  Marland Kitchen DDD (degenerative disc disease), cervical   . Depressive disorder, not elsewhere classified   . Difficult intubation   . Dysphagia, oral phase   . Dyspnea   . Edema   . Gallstones    a. Symptomatic - s/p lap chole 05/2018.  Marland Kitchen History of DVT (deep vein thrombosis)   . History of kidney stones   . Hypertension   . Hypoxemia   . Impacted cerumen   . Ischemic colitis ([place])    a. 02/2018 GIB - colonoscopy w/ isch colitis. Anticoagulation resumed.  . Long term current use of anticoagulant 03/02/2011  . LOW BACK PAIN SYNDROME 03/17/2009   Qualifier: Diagnosis of  By: Council Mechanic MD, Hilaria Ota   . Mixed hyperlipidemia   . 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 Other myelopathy   . Pain in limb   . Palpitations   . Paroxysmal Atrial Fibrillation ([place])    a. a. 01/2011 in setting of post-op complications including aspiration pna;  b. CHA2DS2VASc = 4--> Amio/Eliquis.  . Pneumonia 03/06/2003  . Spinal stenosis, unspecified region other than cervical   . Squamous cell carcinoma of skin of trunk,  except scrotum    skin cancer of shoulder  . Syncope    a. 10/2017-->Event monitor: RSR, rare PACs/PVCs.  . Thoracic or lumbosacral neuritis or radiculitis, unspecified     Past Surgical History:  Procedure Laterality Date  . CARDIOVERSION N/A 06/20/2017   Procedure: CARDIOVERSION;  Surgeon: [name], MD;  Location: [facility];  Service: Cardiovascular;  Laterality: N/A;  . CATARACT EXTRACTION W/ INTRAOCULAR LENS  IMPLANT, BILATERAL    . CERVICAL FUSION  02/10/2011  . CHOLECYSTECTOMY  05/24/2018  . CHOLECYSTECTOMY N/A 05/24/2018   Procedure: LAPAROSCOPIC CHOLECYSTECTOMY;  Surgeon: Coralie Keens, MD;  Location: [facility];  Service: General;  Laterality: N/A;  . COLONOSCOPY WITH PROPOFOL N/A 02/20/2018   Procedure: COLONOSCOPY WITH PROPOFOL;  Surgeon: Lucilla Lame, MD;  Location: [facility];  Service: Endoscopy;  Laterality: N/A;  . history of abd ultrasound  11/01   fatty liver  . MULTIPLE TOOTH EXTRACTIONS    . ORIF FIBULA FRACTURE Left 01/06/2017   Procedure: OPEN REDUCTION INTERNAL FIXATION (ORIF) FIBULA FRACTURE DISTAL FIBULA;  Surgeon: Melrose Nakayama, MD;  Location: [facility];  Service: Orthopedics;  Laterality: Left;  Patient states has problems if he will  have a tube in throat for Genera; Anesthesia    There were no vitals filed for this visit.      Subjective: Patient behavior: (alertness, ability to follow instructions, etc.): pt reported c/o Cough at END and AFTER meals when he sits down to watch TV, the feeling like "my breath gets cut off when I am eating sometimes", and ongoing s/s of Indigestion/Reflux - he is on a PPI 1x daily of 20 mg he believes. Pt denied overt swallowing difficulty (coughing, choking) when drinking sips of liquids and endorsed more difficulty w/ solid foods in his meals. Pt has a few upper Dentition; a lower partial plate.  He denied any CVA or other neurological dxs; no recent pneumonia. Pt does have apparent extensive Cervical Fusion w/ apparent  min pharyngeal-cervical dysphagia per MBSS in 2012 per chart notes = at that time, he was recommended to use small sips of liquids, double swallowing, and a cough if needed to aid airway protection.  Currently, pt also has significant c/o a "tight belly"/stomach w/ increased pressure that is uncomfortable; endorsed unknown weight gain. Encouraged pt to talk w/ his PCP or GI about this, as well as the Reflux issues for further management. Chief complaint: dysphagia    Objective:  Radiological Procedure: A videoflouroscopic evaluation of oral-preparatory, reflex initiation, and pharyngeal phases of the swallow was performed; as well as a screening of the upper esophageal phase.  I. POSTURE: upright II. VIEW: lateral III. COMPENSATORY STRATEGIES: f/u, dry swallow to reduce pharyngeal residue; alternating food/liquids IV. BOLUSES ADMINISTERED:  Thin Liquid: 4 trials  Nectar-thick Liquid: 1 trial  Honey-thick Liquid: NT  Puree: 2 trials  Mechanical Soft: 1 trial V. RESULTS OF EVALUATION: A. ORAL PREPARATORY PHASE: (The lips, tongue, and velum are observed for strength and coordination)       **Overall Severity Rating: grossly WFL. Pt is missing upper dentition thus impacting effective mastication of increased textured foods. Adequate bolus management and control for A-P transfer noted; adequate oral clearing noted.  B. SWALLOW INITIATION/REFLEX: (The reflex is normal if "triggered" by the time the bolus reached the base of the tongue)  **Overall Severity Rating: grossly WFL. Noted trials of Thin liquids and Nectar liquids to trigger the pharyngeal swallow at, and spilling from, the Valleculae. Adequate airway closure/protection achieved during the swallow. No laryngeal penetration or aspiration noted during the po trials.   C. PHARYNGEAL PHASE: (Pharyngeal function is normal if the bolus shows rapid, smooth, and continuous transit through the pharynx and there is no pharyngeal residue after the  swallow)  **Overall Severity Rating: Mild. Slight-min bolus retention noted in both the Valleculae and the Pyriform Sinuses post the initial swallow which could indicate a slight decrease in pharyngeal pressure and excursion during the swallow. This potentially could be seen w/ issues of Esophageal dysmotility and increased (distal) Esophageal pressure impacting the (superior) pharyngeal phase.   D. LARYNGEAL PENETRATION: (Material entering into the laryngeal inlet/vestibule but not aspirated): None E. ASPIRATION: None F. ESOPHAGEAL PHASE: (Screening of the upper esophagus): bolus stasis and slower clearing noted in the mid-Esophagus.   ASSESSMENT: Pt appeared to present w/ grossly adequate oropharyngeal phase swallowing function w/ no gross oropharyngeal phase dysphagia; no Aspiration noted during this exam. During the pharyngeal phase, noted trials of Thin liquids and Nectar liquids to trigger the pharyngeal swallow at, and spilling from, the Valleculae. Adequate airway closure/protection achieved during the swallow. No laryngeal penetration or aspiration noted during the po trials. Slight-min bolus retention noted in both  the Valleculae and the Pyriform Sinuses post the initial swallow which could indicate a slight decrease in pharyngeal pressure and excursion during the swallow. This could potentially be seen w/ issues of Esophageal dysmotility and increased (distal) Esophageal pressure impacting the (superior) pharyngeal phase. Pt was able to reduce this retention of residue w/ a f/u, dry swallow. This residue did not build/increase as trials continued. During the oral phase, noted pt is missing upper dentition thus impacting effective mastication of increased textured foods. Adequate bolus management and control for A-P transfer noted otherwise; adequate oral clearing noted. During the Esophageal phase, bolus stasis and slower clearing of bolus material noted in the mid-Esophagus. This could be related  to pt's Esophageal dysmotility complaints; Reflux.   PLAN/RECOMMENDATIONS:  A. Diet: mech soft diet (moist foods, cut meats); thin liquids. Pills Whole in Puree if easier for swallowing and Esophageal clearing.  B. Swallowing Precautions: general aspiration precautions  C. Recommended consultation to f/u w/ GI for further assessment and management of Esophageal dysmotility and GI discomfort; Reflux.   D. Therapy recommendations: none  E. Results and recommendations were discussed w/ pt; Video viewed w/ education given to try to answer the questions pt had.            Dysphagia, pharyngoesophageal phase  Dysphagia, unspecified type - Plan: DG OP Swallowing Func-Medicare/Speech Path, DG OP Swallowing Func-Medicare/Speech Path        Problem List Patient Active Problem List   Diagnosis Date Noted  . S/P laparoscopic cholecystectomy 05/24/2018  . Blood in stool   . Ischemic bowel disease ([place])   . Lower GI bleed   . BRBPR (bright red blood per rectum) 02/18/2018  . Calculus of gallbladder without cholecystitis without obstruction 02/01/2018  . Right upper quadrant abdominal pain 02/01/2018  . Chest pain 10/20/2017  . Syncope and collapse 10/20/2017  . S/P ORIF (open reduction internal fixation) fracture 03/07/2017  . Lower extremity edema 03/07/2017  . Chronic diastolic CHF (congestive heart failure) ([place]) 01/19/2015  . Cervical myelopathy with cervical radiculopathy 06/28/2013  . Cataract of both eyes 06/28/2013  . Anemia of chronic disease 04/14/2013  . Spondylosis, cervical, with myelopathy   . Spinal stenosis, lumbar region, with neurogenic claudication   . DDD (degenerative disc disease), cervical   . PAF (paroxysmal atrial fibrillation) ([place])   . Hypertension   . Dizziness 11/09/2011  . Edema 05/18/2011  . Long term current use of anticoagulant 03/02/2011  . VITAMIN D DEFICIENCY 11/29/2010  . Anxiety 07/16/2009  . LOW BACK PAIN SYNDROME 03/17/2009  .  NONTRAUMATIC RUPTURE OF TENDONS OF BICEPS 12/01/2008  . ERECTILE DYSFUNCTION, ORGANIC 12/10/2007  . Hyperlipidemia 05/28/2007  . SEXUAL DYSFUNCTION 05/28/2007  . FATTY LIVER DISEASE- ABD. U/S 11/01, MILD ELEVATED LFT'S 05/28/2007       Orinda Kenner, MS, CCC-SLP Jaidon Ellery 09/13/2018, 4:11 PM  [place], [place], [address] Phone: [phone]   Fax:     Name: Eric [name] MRN: [id number] Date of Birth: 07/09/1932

## 2018-09-14 ENCOUNTER — Telehealth: Payer: Self-pay | Admitting: *Deleted

## 2018-09-14 NOTE — Telephone Encounter (Signed)
Patient's daughter calling back to discuss results of swallow study that were already given to patient. She verbalized understanding and is aware that f/u with GI doctor is necessary. She is in the process of scheduling this now.

## 2018-09-21 DIAGNOSIS — R0602 Shortness of breath: Secondary | ICD-10-CM | POA: Diagnosis not present

## 2018-09-21 DIAGNOSIS — I5032 Chronic diastolic (congestive) heart failure: Secondary | ICD-10-CM | POA: Diagnosis not present

## 2018-10-03 ENCOUNTER — Telehealth: Payer: Self-pay | Admitting: Cardiovascular Disease

## 2018-10-03 NOTE — Telephone Encounter (Signed)
Patient calling to see if we are able to re order his oxygen  He isn't sure if we ordered it the first time or not  Please call back

## 2018-10-04 ENCOUNTER — Other Ambulatory Visit: Payer: Self-pay | Admitting: Nurse Practitioner

## 2018-10-04 NOTE — Telephone Encounter (Signed)
Spoke with patient and reviewed that he should contact pulmonary for oxygen orders. Let him know that I would send this message over to their office for review. He was appreciative for the call with no further questions at this time.

## 2018-10-04 NOTE — Telephone Encounter (Signed)
Called and spoke with daughter, notified her that Eric [name] would have to come in for an appointment in order to renew his oxygen. She was upset that we had to see the patient, in order for compliance for insurance to continue coverage of the oxygen. Patient was then scheduled for 10/16/18.

## 2018-10-08 ENCOUNTER — Other Ambulatory Visit: Payer: Self-pay | Admitting: Gastroenterology

## 2018-10-08 DIAGNOSIS — R1011 Right upper quadrant pain: Secondary | ICD-10-CM | POA: Diagnosis not present

## 2018-10-08 DIAGNOSIS — K625 Hemorrhage of anus and rectum: Secondary | ICD-10-CM | POA: Diagnosis not present

## 2018-10-08 DIAGNOSIS — R1013 Epigastric pain: Secondary | ICD-10-CM | POA: Diagnosis not present

## 2018-10-12 ENCOUNTER — Ambulatory Visit
Admission: RE | Admit: 2018-10-12 | Discharge: 2018-10-12 | Disposition: A | Discharge status: Home / Self Care | Payer: PPO | Source: Ambulatory Visit | Attending: Gastroenterology | Admitting: Gastroenterology

## 2018-10-12 DIAGNOSIS — R1011 Right upper quadrant pain: Secondary | ICD-10-CM

## 2018-10-12 DIAGNOSIS — R1013 Epigastric pain: Secondary | ICD-10-CM

## 2018-10-12 DIAGNOSIS — K625 Hemorrhage of anus and rectum: Secondary | ICD-10-CM

## 2018-10-12 DIAGNOSIS — N281 Cyst of kidney, acquired: Secondary | ICD-10-CM | POA: Diagnosis not present

## 2018-10-12 MED ORDER — IOPAMIDOL (ISOVUE-300) INJECTION 61%
100.0000 mL | Freq: Once | INTRAVENOUS | Status: AC | PRN
  Administered 2018-10-12: 100 mL via INTRAVENOUS

## 2018-10-16 ENCOUNTER — Ambulatory Visit: Payer: PPO | Admitting: Pulmonary Disease

## 2018-10-16 ENCOUNTER — Ambulatory Visit
Admission: RE | Admit: 2018-10-16 | Discharge: 2018-10-16 | Disposition: A | Discharge status: Home / Self Care | Payer: PPO | Source: Ambulatory Visit | Attending: Pulmonary Disease | Admitting: Pulmonary Disease

## 2018-10-16 ENCOUNTER — Encounter: Payer: Self-pay | Admitting: Pulmonary Disease

## 2018-10-16 VITALS — BP 150/72 | HR 117 | Ht 66.0 in | Wt 187.2 lb

## 2018-10-16 DIAGNOSIS — R0609 Other forms of dyspnea: Secondary | ICD-10-CM | POA: Diagnosis not present

## 2018-10-16 DIAGNOSIS — K219 Gastro-esophageal reflux disease without esophagitis: Secondary | ICD-10-CM

## 2018-10-16 DIAGNOSIS — R05 Cough: Secondary | ICD-10-CM

## 2018-10-16 DIAGNOSIS — R053 Chronic cough: Secondary | ICD-10-CM

## 2018-10-16 DIAGNOSIS — G4734 Idiopathic sleep related nonobstructive alveolar hypoventilation: Secondary | ICD-10-CM | POA: Diagnosis not present

## 2018-10-16 DIAGNOSIS — R0602 Shortness of breath: Secondary | ICD-10-CM | POA: Diagnosis not present

## 2018-10-16 DIAGNOSIS — I517 Cardiomegaly: Secondary | ICD-10-CM | POA: Insufficient documentation

## 2018-10-16 MED ORDER — FLUTICASONE FUROATE-VILANTEROL 100-25 MCG/INH IN AEPB: 1.0000 | INHALATION_SPRAY | Freq: Every day | RESPIRATORY_TRACT | 0 refills | Status: DC

## 2018-10-16 NOTE — Patient Instructions (Signed)
Change Flonase to 1 spray per nostril daily Continue Protonix 40 mg daily at bedtime Trial of Breo inhaler, 1 inhalation daily.  Rinse mouth after use Use of the Breo inhaler and cycle off prior to next visit Chest x-ray today Nocturnal oxygen therapy will be renewed  Follow-up in 4 to 6 weeks

## 2018-10-16 NOTE — Progress Notes (Signed)
PULMONARY OFFICE FOLLOW-UP NOTE  Requesting MD/Service: Rockey Situ Date of initial consultation: 09/18/17 Reason for consultation: Chronic cough  PT PROFILE: 84 y.o. male with minimal very remote smoking history referred for evaluation of chronic cough   DATA: 02/13/11 CT chest: Bibasilar atelectasis. No other significant findings 10/19/17 hospitalized for syncope.  Noted to have paroxysmal atrial fibrillation.  Discharged home on nocturnal oxygen. 11/12/17 CTAP: No acute intra-abdominal or pelvic pathology  SUBJ:  He is here specifically to recertify for nocturnal oxygen.  He reports persistent mild to moderate nonproductive cough.  He denies nasal congestion and reflux symptoms.  He remains on pantoprazole.  He also reports frequent very brief episodes of chest pressure which "take my breath away".  He is very weak and requires a walker for ambulation.  Nonetheless, he does describe mild exertional dyspnea.  He denies pleuritic chest pain, purulent sputum, hemoptysis, lower extremity edema, calf tenderness.   Vitals:   10/16/18 1104 10/16/18 1109  BP:  (!) 150/72  Pulse:  (!) 117  SpO2:  94%  Weight: 187 lb 3.2 oz (84.9 kg)   Height: 5\' 6"  (1.676 m)   Room air   EXAM:  Gen: Frail-appearing, no overt distress HEENT: NCAT, sclerae white, bilateral tympanic canals impacted with cerumen Neck: No lymphadenopathy, no JVD Lungs: Faint bibasilar crackles, no wheezes Cardiovascular: Regular, no M Abdomen: Soft, NT, NABS Ext: No C/C/E Neuro: Diffusely weak, no focal deficit Skin: Limited exam, no lesions noted   DATA:   BMP Latest Ref Rng & Units 05/24/2018 02/19/2018 02/18/2018  Glucose 65 - 99 mg/dL 90 86 108(H)  BUN 6 - 20 mg/dL 21(H) 20 24(H)  Creatinine 0.61 - 1.24 mg/dL 1.40(H) 1.23 1.28(H)  BUN/Creat Ratio 6 - 22 (calc) - - -  Sodium 135 - 145 mmol/L 140 140 142  Potassium 3.5 - 5.1 mmol/L 3.9 3.6 4.1  Chloride 101 - 111 mmol/L 104 109 108  CO2 22 - 32 mmol/L 27 22 27    Calcium 8.9 - 10.3 mg/dL 9.1 8.1(L) 8.9    CBC Latest Ref Rng & Units 05/22/2018 02/21/2018 02/21/2018  WBC 4.0 - 10.5 K/uL 6.7 - -  Hemoglobin 13.0 - 17.0 g/dL 15.2 14.3 13.9  Hematocrit 39.0 - 52.0 % 47.6 - -  Platelets 150 - 400 K/uL 202 - -    CXR 10/16/18: Low lung volumes with elevated diaphragms and vascular crowding.  Mild RUL atelectasis  IMPRESSION:     ICD-10-CM   1. Chronic cough R05 DG Chest 2 View  2. Gastroesophageal reflux disease, esophagitis presence not specified K21.9   3. DOE (dyspnea on exertion) R06.09 DG Chest 2 View  4. Nocturnal hypoxemia G47.34      PLAN:  Change Flonase to 1 spray per nostril daily Continue Protonix 40 mg daily at bedtime Trial of Breo inhaler, 1 inhalation daily.  Rinse mouth after use Use up the Breo inhaler and cycle off prior to next visit Chest x-ray today viewed above Nocturnal oxygen therapy will be renewed.  He is compliant with oxygen therapy and symptomatically benefiting from it  Follow-up in 4 to 6 weeks  ADD: Based on the chest x-ray findings, if he continues to have significant exertional dyspnea on follow-up, it would be reasonable to consider fluoroscopic evaluation of his diaphragms  Merton Border, MD PCCM service Mobile [phone] Pager [phone] 10/16/2018 3:33 PM

## 2018-10-23 DIAGNOSIS — I5032 Chronic diastolic (congestive) heart failure: Secondary | ICD-10-CM | POA: Diagnosis not present

## 2018-10-23 DIAGNOSIS — R0602 Shortness of breath: Secondary | ICD-10-CM | POA: Diagnosis not present

## 2018-10-29 ENCOUNTER — Telehealth: Payer: Self-pay | Admitting: Pulmonary Disease

## 2018-10-29 NOTE — Telephone Encounter (Signed)
Please call with cxr results

## 2018-10-29 NOTE — Telephone Encounter (Signed)
Dr. Alva Garnet ordered this and would need to be sent to their office.

## 2018-10-30 NOTE — Telephone Encounter (Signed)
Patient daughter calling Would like to speak with a nurse whether results are ready or not - will be unavailable after 2pm Please call to discuss

## 2018-11-01 DIAGNOSIS — Z961 Presence of intraocular lens: Secondary | ICD-10-CM | POA: Diagnosis not present

## 2018-11-02 NOTE — Telephone Encounter (Signed)
There are no findings on CXR that are of concern to me  Eric [name]

## 2018-11-02 NOTE — Telephone Encounter (Signed)
Returned call to patient. LMTCB

## 2018-11-02 NOTE — Telephone Encounter (Signed)
Pt's daughter notified.

## 2018-11-02 NOTE — Telephone Encounter (Signed)
Pt daughter is calling back, states it is ok to leave cxr results on her voicemail.

## 2018-11-19 NOTE — Progress Notes (Signed)
Patient ID: Eric [name], male   DOB: [birth year], 84 y.o.   MRN: [id number] Cardiology Office Note  Date:  11/20/2018   ID:  Eric [name], DOB January 15, 1932, MRN [id number]  PCP:  [name], DO   Chief Complaint  Patient presents with  . other    3 month follow up and disccuss Zio results. Pt. c/o shortness of breath & LE edema. Meds reviewed by the pt. verbally.     HPI:  [name] is a very pleasant 84 year old gentleman with a history of  atrial fibrillation, frequent PVCs,  who has been maintaining sinus rhythm, severe neck disease and stenosis who underwent cervical and thoracic laminectomy and fusion with instrumentation at the beginning of March 2012 with postoperative complications including aspiration requiring a feeding tube  Remote smoking in the [employer] when younger who presents for routine followup of his atrial fibrillation, persistent  In follow-up he reports difficulty walking No regular exercise program Takes Lasix 40 daily with potassium 10 daily No leg swelling  Getting samples of eliquis from family  Not much walking, Chronic shortness of breath on exertion home SOB with bending over, Walks with a walker Family reports that he was doing better with PT, now inactive is very deconditioned  Very sedentary, sitting most of the day  On prior office visit was having pauses in his breathing Event monitor was ordered Event Monitor discussed with him in detail Avg HR of 58 bpm. Predominant underlying rhythm was Sinus Rhythm.  Second Degree AV Block-Mobitz I (Wenckebach) was present.  Isolated SVEs were rare (<1.0%), and no SVE Couplets or SVE Triplets were present. Isolated VEs were rare (<1.0%), and no VE Couplets or VE Triplets were present. Patient triggered events were not associated with significant arrhythmia.  EKG personally reviewed by myself on todays visit Shows normal sinus rhythm with rate 54 bpm no significant ST-T wave changes   Back in NSR on EKG  today On last office visit had atrial fibrillation  Atrial fibrillation developed likely sometime within the past several months Normal sinus rhythm June 2017 Atrial fibrillation April 2018. Relatively asymptomatic except for leg swelling which started approximately February or March  Echocardiogram 04/04/2017 with ejection fraction 55-60%, mildly dilated left atrium   Was taking lasix 40 mg x 3 BID at end of April (120 g twice a day) Incorrectly interpreted the instructions, lab work showing prerenal state Since then has been taking 60 mg twice a day Broken foot in Aug 2017, foot did not heal Had surgery 12/2016 Difficult time recovering  Previous CT scan 2012 documenting coronary calcifications  History ofrestless leg at nighttime. He reports having a limited range of motion of his left shoulder, unable to raise his left arm above his shoulder height. Denies any trauma to the left arm  Previous lab work reviewed with him showing hemoglobin A1c 5.7, total cholesterol 144  Previous visit for cortisone shot for his back was put on hold secondary to ectopy .  In followup in our clinic, 30 day Holter monitor was ordered.This has shown frequent PVCs, occasional couplets, triplets. Very rare episodes of dizziness.   PMH:   has a past medical history of Anxiety, Arthritis, Atypical chest pain, Chest pain (10/20/2017), Chronic diastolic CHF (congestive heart failure) ([place]), DDD (degenerative disc disease), cervical, Depressive disorder, not elsewhere classified, Difficult intubation, Dysphagia, oral phase, Dyspnea, Edema, Gallstones, History of DVT (deep vein thrombosis), History of kidney stones, Hypertension, Hypoxemia, Impacted cerumen, Ischemic colitis ([place]), Long term  current use of anticoagulant (03/02/2011), LOW BACK PAIN SYNDROME (03/17/2009), Mixed hyperlipidemia, Nonunion of foot fracture, Other myelopathy, Pain in limb, Palpitations, Paroxysmal Atrial Fibrillation ([place]), Pneumonia  (03/06/2003), Spinal stenosis, unspecified region other than cervical, Squamous cell carcinoma of skin of trunk, except scrotum, Syncope, and Thoracic or lumbosacral neuritis or radiculitis, unspecified.  PSH:    Past Surgical History:  Procedure Laterality Date  . CARDIOVERSION N/A 06/20/2017   Procedure: CARDIOVERSION;  Surgeon: [name], MD;  Location: [facility];  Service: Cardiovascular;  Laterality: N/A;  . CATARACT EXTRACTION W/ INTRAOCULAR LENS  IMPLANT, BILATERAL    . CERVICAL FUSION  02/10/2011  . CHOLECYSTECTOMY  05/24/2018  . CHOLECYSTECTOMY N/A 05/24/2018   Procedure: LAPAROSCOPIC CHOLECYSTECTOMY;  Surgeon: Coralie Keens, MD;  Location: [place];  Service: General;  Laterality: N/A;  . COLONOSCOPY WITH PROPOFOL N/A 02/20/2018   Procedure: COLONOSCOPY WITH PROPOFOL;  Surgeon: Lucilla Lame, MD;  Location: [facility];  Service: Endoscopy;  Laterality: N/A;  . history of abd ultrasound  11/01   fatty liver  . MULTIPLE TOOTH EXTRACTIONS    . ORIF FIBULA FRACTURE Left 01/06/2017   Procedure: OPEN REDUCTION INTERNAL FIXATION (ORIF) FIBULA FRACTURE DISTAL FIBULA;  Surgeon: Melrose Nakayama, MD;  Location: [place];  Service: Orthopedics;  Laterality: Left;  Patient states has problems if he will have a tube in throat for Genera; Anesthesia    Current Outpatient Medications  Medication Sig Dispense Refill  . amiodarone (PACERONE) 200 MG tablet TAKE 1 TABLET BY MOUTH EVERY DAY 30 tablet 5  . apixaban (ELIQUIS) 5 MG TABS tablet Take 1 tablet (5 mg total) by mouth 2 (two) times daily. 60 tablet 6  . cetirizine (ZYRTEC) 10 MG tablet TAKE 1 TABLET BY MOUTH ONCE DAILY 90 tablet 0  . Cholecalciferol (VITAMIN D) 1000 UNITS capsule Take one tablet once daily 90 capsule 3  . fluticasone (FLONASE) 50 MCG/ACT nasal spray Place 2 sprays daily into both nostrils. 16 g 10  . fluticasone furoate-vilanterol (BREO ELLIPTA) 100-25 MCG/INH AEPB Inhale 1 puff into the lungs daily.    . furosemide  (LASIX) 40 MG tablet Take 1 tablet (40 mg total) by mouth daily. 90 tablet 3  . gabapentin (NEURONTIN) 300 MG capsule Take 1 capsule (300 mg total) by mouth 2 (two) times daily. 180 capsule 1  . metoprolol tartrate (LOPRESSOR) 25 MG tablet Take 1 tablet (25 mg total) 2 (two) times daily by mouth. 60 tablet 11  . pantoprazole (PROTONIX) 40 MG tablet TAKE 1 TABLET BY MOUTH AT BEDTIME 90 tablet 1  . potassium chloride SA (K-DUR,KLOR-CON) 10 MEQ tablet Take 1 tablet (10 mEq total) by mouth daily. 90 tablet 3  . pravastatin (PRAVACHOL) 80 MG tablet TAKE 1 TABLET BY MOUTH ONCE DAILY 90 tablet 0  . traMADol (ULTRAM) 50 MG tablet Take 1 tablet (50 mg total) by mouth every 6 (six) hours as needed. 20 tablet 0   No current facility-administered medications for this visit.      Allergies:   Morphine and related; Percocet [oxycodone-acetaminophen]; and Valium   Social History:  The patient  reports that he has quit smoking. He quit after 1.00 year of use. He has never used smokeless tobacco. He reports that he does not drink alcohol or use drugs.   Family History:   family history includes Atrial fibrillation in his brother; Cancer in his other; Heart disease in his brother; Stroke in his father and paternal grandfather.    Review of Systems: Review of  Systems  Constitutional: Negative.   Respiratory: Positive for shortness of breath.   Cardiovascular: Negative.   Gastrointestinal: Negative.   Musculoskeletal: Positive for neck pain.       Gait instability, decreased range of motion left arm  Neurological: Negative.   Psychiatric/Behavioral: Negative.   All other systems reviewed and are negative.    PHYSICAL EXAM: VS:  BP 140/68 (BP Location: Left Arm, Patient Position: Sitting, Cuff Size: Normal)   Pulse (!) 54   Ht 5\' 6"  (1.676 m)   Wt 187 lb (84.8 kg)   BMI 30.18 kg/m  , BMI Body mass index is 30.18 kg/m. Constitutional:  oriented to person, place, and time. No distress.  Walks with  a walker Unsteady gait getting down from the table HENT:  Head: Grossly normal Eyes:  no discharge. No scleral icterus.  Neck: No JVD, no carotid bruits  Cardiovascular: Regular rate and rhythm, no murmurs appreciated Pulmonary/Chest: Clear to auscultation bilaterally, no wheezes or rails Abdominal: Soft.  no distension.  no tenderness.  Musculoskeletal: Normal range of motion, decreased range of motion of his neck Neurological:  normal muscle tone. Coordination normal. No atrophy Skin: Skin warm and dry Psychiatric: normal affect, pleasant   Recent Labs: 02/18/2018: ALT 44 05/22/2018: Hemoglobin 15.2; Platelets 202 05/24/2018: BUN 21; Creatinine, Ser 1.40; Potassium 3.9; Sodium 140    Lipid Panel Lab Results  Component Value Date   CHOL 187 09/01/2017   HDL 34 (L) 09/01/2017   LDLCALC 113 (H) 09/01/2017   TRIG 303 (H) 09/01/2017      Wt Readings from Last 3 Encounters:  11/20/18 187 lb (84.8 kg)  11/20/18 189 lb 6.4 oz (85.9 kg)  10/16/18 187 lb 3.2 oz (84.9 kg)      ASSESSMENT AND PLAN:  PAF (paroxysmal atrial fibrillation) (HCC) - Plan: EKG 12-Lead Maintaining normal sinus rhythm Continue anticoagulation, low-dose amiodarone  Chronic diastolic CHF (congestive heart failure) (HCC) - Plan: EKG 12-Lead  he is taking Lasix 40 mg daily with potassium 10 daily Medication list adjusted Will likely need higher doses when in atrial fibrillation home Appears euvolemic on today's visit  Essential hypertension - Plan: EKG 12-Lead Stable blood pressure, no changes made  Hyperlipidemia Continue pravastatin  Chronic shortness of breath Followed by pulmonary, will to afford inhalers Recommended respiratory rehab, " lung works" Unable to get a ride to these therapy sessions We will look into transportation modes for him as neither he or his wife drive Bladder can only assist on select days when she is off work  Lower extremity edema Minimal edema on today's  visit Likely improved on Lasix daily with normal sinus rhythm  CAD seen on CT scan 2012 Denies anginal symptoms, need aggressive lipid management  Weakness High fall risk, Walks with a walker  Disposition:   F/U  12 months   Total encounter time more than 25 minutes  Greater than 50% was spent in counseling and coordination of care with the patient    Orders Placed This Encounter  Procedures  . EKG 12-Lead     Signed, Esmond Plants, M.D., Ph.D. 11/20/2018  [place], [place]

## 2018-11-20 ENCOUNTER — Telehealth: Payer: Self-pay | Admitting: Cardiovascular Disease

## 2018-11-20 ENCOUNTER — Ambulatory Visit: Payer: PPO | Admitting: Cardiovascular Disease

## 2018-11-20 ENCOUNTER — Encounter: Payer: Self-pay | Admitting: Pulmonary Disease

## 2018-11-20 ENCOUNTER — Encounter: Payer: Self-pay | Admitting: Cardiovascular Disease

## 2018-11-20 ENCOUNTER — Ambulatory Visit: Payer: PPO | Admitting: Pulmonary Disease

## 2018-11-20 VITALS — BP 140/68 | HR 54 | Ht 66.0 in | Wt 187.0 lb

## 2018-11-20 VITALS — BP 142/84 | HR 70 | Ht 66.0 in | Wt 189.4 lb

## 2018-11-20 DIAGNOSIS — R531 Weakness: Secondary | ICD-10-CM | POA: Diagnosis not present

## 2018-11-20 DIAGNOSIS — G4734 Idiopathic sleep related nonobstructive alveolar hypoventilation: Secondary | ICD-10-CM

## 2018-11-20 DIAGNOSIS — R42 Dizziness and giddiness: Secondary | ICD-10-CM | POA: Diagnosis not present

## 2018-11-20 DIAGNOSIS — I10 Essential (primary) hypertension: Secondary | ICD-10-CM

## 2018-11-20 DIAGNOSIS — I5033 Acute on chronic diastolic (congestive) heart failure: Secondary | ICD-10-CM

## 2018-11-20 DIAGNOSIS — R1310 Dysphagia, unspecified: Secondary | ICD-10-CM

## 2018-11-20 DIAGNOSIS — I480 Paroxysmal atrial fibrillation: Secondary | ICD-10-CM

## 2018-11-20 DIAGNOSIS — R55 Syncope and collapse: Secondary | ICD-10-CM

## 2018-11-20 DIAGNOSIS — R0600 Dyspnea, unspecified: Secondary | ICD-10-CM

## 2018-11-20 DIAGNOSIS — I5032 Chronic diastolic (congestive) heart failure: Secondary | ICD-10-CM

## 2018-11-20 MED ORDER — FLUTICASONE FUROATE-VILANTEROL 100-25 MCG/INH IN AEPB: 1.0000 | INHALATION_SPRAY | Freq: Every day | RESPIRATORY_TRACT | 5 refills | Status: DC

## 2018-11-20 MED ORDER — FUROSEMIDE 40 MG PO TABS: 40.0000 mg | ORAL_TABLET | Freq: Every day | ORAL | 3 refills | Status: DC

## 2018-11-20 MED ORDER — PRAVASTATIN SODIUM 80 MG PO TABS: 80.0000 mg | ORAL_TABLET | Freq: Every day | ORAL | 3 refills | Status: DC

## 2018-11-20 MED ORDER — POTASSIUM CHLORIDE CRYS ER 10 MEQ PO TBCR
10.0000 meq | EXTENDED_RELEASE_TABLET | Freq: Every day | ORAL | 3 refills | Status: DC
Start: 2018-11-20 — End: 2021-08-24

## 2018-11-20 NOTE — Telephone Encounter (Signed)
Patient daughter Jenny Reichmann returning call  States she will be on a school bus for the rest of the afternoon  Would like an email or a text message with information if possible email is [email or web address] Please advise

## 2018-11-20 NOTE — Progress Notes (Signed)
PULMONARY OFFICE FOLLOW-UP NOTE  Requesting MD/Service: Rockey Situ Date of initial consultation: 09/18/17 Reason for consultation: Chronic cough  PT PROFILE: 84 y.o. male with minimal very remote smoking history referred for evaluation of chronic cough   DATA: 02/13/11 CT chest: Bibasilar atelectasis. No other significant findings 10/19/17 hospitalized for syncope.  Noted to have paroxysmal atrial fibrillation.  Discharged home on nocturnal oxygen. 11/12/17 CTAP: No acute intra-abdominal or pelvic pathology  SUBJ:  This is a scheduled follow-up.  Last visit, I initiated a trial of Breo and we provided him a sample.  He felt that he was benefited by the [facility] inhaler.  He finished up the sample and did not contact us to get it initiated as a prescription.  He now reports worsening dyspnea with minimal exertion.  This morning, he had difficulty putting on his socks and shoes because it made him out of breath.  He also remains very weak with difficulty ambulating.  He has minimal cough.  He denies CP, fever, purulent sputum, hemoptysis and calf tenderness.   Vitals:   11/20/18 1016 11/20/18 1018  BP:  (!) 142/84  Pulse:  70  SpO2:  94%  Weight: 189 lb 6.4 oz (85.9 kg)   Height: 5\' 6"  (1.676 m)   Room air   EXAM:  Gen: Very frail, unable to step up to exam table, no overt dyspnea at rest HEENT: NCAT, sclerae white Neck: No lymphadenopathy, no JVD noted Lungs: bibasilar crackles, no wheezes Cardiovascular: Regular, no murmur Abdomen: Soft, NT, NABS Ext: Trace symmetric ankle edema Neuro: Cranial nerves intact, diffusely weak, no noted sensory defect, L patellar DTR 3+, R patellar DTR 1+ Skin: Limited exam, no lesions noted   DATA:   BMP Latest Ref Rng & Units 05/24/2018 02/19/2018 02/18/2018  Glucose 65 - 99 mg/dL 90 86 108(H)  BUN 6 - 20 mg/dL 21(H) 20 24(H)  Creatinine 0.61 - 1.24 mg/dL 1.40(H) 1.23 1.28(H)  BUN/Creat Ratio 6 - 22 (calc) - - -  Sodium 135 - 145 mmol/L 140 140 142   Potassium 3.5 - 5.1 mmol/L 3.9 3.6 4.1  Chloride 101 - 111 mmol/L 104 109 108  CO2 22 - 32 mmol/L 27 22 27   Calcium 8.9 - 10.3 mg/dL 9.1 8.1(L) 8.9    CBC Latest Ref Rng & Units 05/22/2018 02/21/2018 02/21/2018  WBC 4.0 - 10.5 K/uL 6.7 - -  Hemoglobin 13.0 - 17.0 g/dL 15.2 14.3 13.9  Hematocrit 39.0 - 52.0 % 47.6 - -  Platelets 150 - 400 K/uL 202 - -    CXR: No new film  IMPRESSION:     ICD-10-CM   1. Weakness R53.1 Ambulatory referral to Neurology  2. Dyspnea, unspecified type R06.00 Pulmonary Function Test [facility] Only   Previously I raised concern for possible diaphragmatic dysfunction but exam today seems to suggest that his diaphragms appear to be functioning.  He is more profoundly limited by lower extremity weakness than by dyspnea.  I raise the concern for a possible progressive neurodegenerative disorder accounting for both weakness and dyspnea.  PLAN:  Change Flonase to 1 spray per nostril daily Continue Protonix 40 mg daily at bedtime Cont Breo inhaler, 1 inhalation daily.  Rinse mouth after use.  Prescription entered PFTs ordered Referral to neurology for evaluation of generalized, severe weakness (particularly lower extremity) Continue nocturnal oxygen.   He is compliant with oxygen therapy and benefiting from it  Follow-up in 2-3 months  Merton Border, MD PCCM service Mobile [phone] Pager [phone] 11/20/2018 11:40  AM

## 2018-11-20 NOTE — Telephone Encounter (Signed)
Left voicemail message that I have telephone numbers and information on possible transportation options and to please call back when possible.

## 2018-11-20 NOTE — Patient Instructions (Signed)
We will look into transportation options, [facility]?   Medication Instructions:  No changes  If you need a refill on your cardiac medications before your next appointment, please call your pharmacy.    Lab work: No new labs needed   If you have labs (blood work) drawn today and your tests are completely normal, you will receive your results only by: Marland Kitchen MyChart Message (if you have MyChart) OR . A paper copy in the mail If you have any lab test that is abnormal or we need to change your treatment, we will call you to review the results.   Testing/Procedures: No new testing needed   Follow-Up: At [facility], you and your health needs are our priority.  As part of our continuing mission to provide you with exceptional heart care, we have created designated Provider Care Teams.  These Care Teams include your primary Cardiologist (physician) and Advanced Practice Providers (APPs -  Physician Assistants and Nurse Practitioners) who all work together to provide you with the care you need, when you need it.  . You will need a follow up appointment in 12 months .   Please call our office 2 months in advance to schedule this appointment.    . Providers on your designated Care Team:   . Murray Hodgkins, NP . Christell Faith, PA-C . Marrianne Mood, PA-C  Any Other Special Instructions Will Be Listed Below (If Applicable).  For educational health videos Log in to : [email or web address] Or : [email or web address], password : [email or web address]

## 2018-11-20 NOTE — Telephone Encounter (Signed)
Spoke with patients daughter per release form and provided her with phone numbers for transportation options. She verbalized understanding with no further questions at this time.

## 2018-11-20 NOTE — Patient Instructions (Addendum)
Resume Breo inhaler.  Prescription entered and sample provided Lung function tests (PFTs) ordered. We will contact you with the results Referral to neurology for evaluation of generalized weakness  Follow up in 2-3 months

## 2018-11-20 NOTE — Telephone Encounter (Signed)
Left detailed voicemail message with phone number to [facility] [phone] with instructions to please call back if any further questions.

## 2018-11-22 DIAGNOSIS — I5032 Chronic diastolic (congestive) heart failure: Secondary | ICD-10-CM | POA: Diagnosis not present

## 2018-11-22 DIAGNOSIS — R0602 Shortness of breath: Secondary | ICD-10-CM | POA: Diagnosis not present

## 2018-12-23 DIAGNOSIS — R0602 Shortness of breath: Secondary | ICD-10-CM | POA: Diagnosis not present

## 2018-12-23 DIAGNOSIS — I5032 Chronic diastolic (congestive) heart failure: Secondary | ICD-10-CM | POA: Diagnosis not present

## 2018-12-25 ENCOUNTER — Ambulatory Visit: Payer: PPO | Attending: Pulmonary Disease

## 2018-12-25 DIAGNOSIS — R0600 Dyspnea, unspecified: Secondary | ICD-10-CM

## 2019-01-07 ENCOUNTER — Other Ambulatory Visit: Payer: Self-pay

## 2019-01-07 MED ORDER — PANTOPRAZOLE SODIUM 40 MG PO TBEC: 40.0000 mg | DELAYED_RELEASE_TABLET | Freq: Every day | ORAL | 1 refills | Status: DC

## 2019-01-07 MED ORDER — METOPROLOL TARTRATE 25 MG PO TABS: 25.0000 mg | ORAL_TABLET | Freq: Two times a day (BID) | ORAL | 11 refills | Status: DC

## 2019-01-07 NOTE — Telephone Encounter (Signed)
*  STAT* If patient is at the pharmacy, call can be transferred to refill team.   1. Which medications need to be refilled? (please list name of each medication and dose if known) Metoprlol, Protonix  2. Which pharmacy/location (including street and city if local pharmacy) is medication to be sent to? [place]  3. Do they need a 30 day or 90 day supply? [place]

## 2019-01-21 ENCOUNTER — Telehealth: Payer: Self-pay | Admitting: Pulmonary Disease

## 2019-01-21 ENCOUNTER — Encounter: Payer: Self-pay | Admitting: Neurology

## 2019-01-21 ENCOUNTER — Telehealth: Payer: Self-pay | Admitting: Neurology

## 2019-01-21 ENCOUNTER — Ambulatory Visit: Payer: PPO | Admitting: Neurology

## 2019-01-21 ENCOUNTER — Other Ambulatory Visit: Payer: Self-pay | Admitting: *Deleted

## 2019-01-21 VITALS — BP 125/71 | HR 63 | Ht 66.0 in | Wt 191.0 lb

## 2019-01-21 DIAGNOSIS — G8929 Other chronic pain: Secondary | ICD-10-CM | POA: Diagnosis not present

## 2019-01-21 DIAGNOSIS — M5441 Lumbago with sciatica, right side: Secondary | ICD-10-CM

## 2019-01-21 DIAGNOSIS — R202 Paresthesia of skin: Secondary | ICD-10-CM | POA: Insufficient documentation

## 2019-01-21 DIAGNOSIS — G3281 Cerebellar ataxia in diseases classified elsewhere: Secondary | ICD-10-CM

## 2019-01-21 DIAGNOSIS — R269 Unspecified abnormalities of gait and mobility: Secondary | ICD-10-CM | POA: Insufficient documentation

## 2019-01-21 MED ORDER — PRAVASTATIN SODIUM 80 MG PO TABS: 80.0000 mg | ORAL_TABLET | Freq: Every day | ORAL | 3 refills | Status: DC

## 2019-01-21 NOTE — Telephone Encounter (Signed)
Patient requested refill

## 2019-01-21 NOTE — Telephone Encounter (Signed)
Called and spoke to pt's daughter, Jenny Reichmann ([place]). Jenny Reichmann is requesting PFT results from 12/25/18. Pt has been scheduled for f/u on 03/11/19. Jenny Reichmann would like results prior to OV. I have made Jenny Reichmann aware that Dr. Alva Garnet is out of the office until 02/06/19.  Dr. Alva Garnet please advise. Thanks.

## 2019-01-21 NOTE — Patient Instructions (Signed)
[facility] in [place], [place]  Address: [address], [place], [place] [address]  Phone: [phone]

## 2019-01-21 NOTE — Telephone Encounter (Signed)
Health team order sent to GI  pt daughter on dpr is aware.

## 2019-01-21 NOTE — Progress Notes (Signed)
PATIENT: Eric [name] DOB: Dec 17, 1931  Chief Complaint  Patient presents with  . Weakness    Reports extremity weakness in his arms and legs.  Feels this has been worsensing, since having neck surgery, in 2012.  He has to rely on a rolling waker for ambulation.   Marland Kitchen PCP    [name], DO  . Pulmonology    Wilhelmina Mcardle, MD - referring MD     HISTORICAL  Eric [name] is a 85 years old male, seen in request by her primary care physician Dr. Mariea Clonts, Jonelle Sidle for evaluation of weakness, initial evaluation was on January 21, 2019.  He is also seen by pulmonologist Dr. Merton Border. He is with his daughter Jenny Reichmann and wife at today's visit.  I have reviewed and summarized the referring note from the referring physician.   He has past medical history of atrial fibrillation on Eliquis, amiodarone, congestive heart failure, hypertension, hyperlipidemia, he had history of cervical decompression in 2012 due to cervical myelopathy, prior to the cervical decompression, he presented with gait abnormality, neck pain, I was able to review MRI of cervical spine in December 2011, multilevel spondylosis with posterior osteophyte, elucidate spurring, and facet hypertrophy, most advanced at the C4-5, with severe spinal stenosis, with cord flattening, and abnormal cord signal, also has lesser degree of central canal stenosis at C5-6, C6-7, C7-T1, without abnormal cord signal,  He recovered well after surgery, for a while without the the cane from 2012-2016, he was able to move in the yard, driving,  but has some persistent mildly unsteady gait, bilateral fingertips numbness, clumsiness  Then he suffered a fall in August 2016, with left foot, ankle fracture, require multiple surgery, then he began to rely on his walker.  He now complains of low back pain, radiating pain to right leg, constant right foot numbness, also persistent bilateral fingertips numbness, clumsiness, difficulty holding a cup, he has neck  pain, limited range of motion of his neck,      REVIEW OF SYSTEMS: Full 14 system review of systems performed and notable only for swelling legs, trouble swallowing, shortness of breath, blood in stool, incontinence, diarrhea, incontinence, difficulty swallowing, restless leg, not enough sleep All other review of systems were negative.  ALLERGIES: Allergies  Allergen Reactions  . Morphine And Related Shortness Of Breath  . Percocet [Oxycodone-Acetaminophen] Shortness Of Breath  . Valium Shortness Of Breath    HOME MEDICATIONS: Current Outpatient Medications  Medication Sig Dispense Refill  . amiodarone (PACERONE) 200 MG tablet TAKE 1 TABLET BY MOUTH EVERY DAY 30 tablet 5  . apixaban (ELIQUIS) 5 MG TABS tablet Take 1 tablet (5 mg total) by mouth 2 (two) times daily. 60 tablet 6  . cetirizine (ZYRTEC) 10 MG tablet TAKE 1 TABLET BY MOUTH ONCE DAILY 90 tablet 0  . Cholecalciferol (VITAMIN D) 1000 UNITS capsule Take one tablet once daily 90 capsule 3  . fluticasone furoate-vilanterol (BREO ELLIPTA) 100-25 MCG/INH AEPB Inhale 1 puff into the lungs daily.    . furosemide (LASIX) 40 MG tablet Take 1 tablet (40 mg total) by mouth daily. 90 tablet 3  . gabapentin (NEURONTIN) 300 MG capsule Take 1 capsule (300 mg total) by mouth 2 (two) times daily. 180 capsule 1  . metoprolol tartrate (LOPRESSOR) 25 MG tablet Take 1 tablet (25 mg total) by mouth 2 (two) times daily. 60 tablet 11  . pantoprazole (PROTONIX) 40 MG tablet Take 1 tablet (40 mg total) by mouth at  bedtime. 90 tablet 1  . potassium chloride SA (K-DUR,KLOR-CON) 10 MEQ tablet Take 1 tablet (10 mEq total) by mouth daily. 90 tablet 3  . pravastatin (PRAVACHOL) 80 MG tablet Take 1 tablet (80 mg total) by mouth daily. 90 tablet 3  . traMADol (ULTRAM) 50 MG tablet Take 1 tablet (50 mg total) by mouth every 6 (six) hours as needed. 20 tablet 0  . fluticasone (FLONASE) 50 MCG/ACT nasal spray Place 2 sprays daily into both nostrils. 16 g 10   No  current facility-administered medications for this visit.     PAST MEDICAL HISTORY: Past Medical History:  Diagnosis Date  . Anxiety   . Arthritis   . Atypical chest pain    a. 05/2017 MV: no ischemia, EF 79%.  . Chest pain 10/20/2017  . Chronic diastolic CHF (congestive heart failure) ([place])    a. 01/2011 Echo: EF 50-55%, gr1 DD, mild AI, nl RV fxn, mild TR/PR; b. 10/2017 Echo: EF 60-65%, mild LVH, gr2 DD.  Marland Kitchen DDD (degenerative disc disease), cervical   . Depressive disorder, not elsewhere classified   . Difficult intubation   . Dysphagia, oral phase   . Dyspnea   . Edema   . Gallstones    a. Symptomatic - s/p lap chole 05/2018.  Marland Kitchen History of DVT (deep vein thrombosis)   . History of kidney stones   . Hypertension   . Hypoxemia   . Impacted cerumen   . Ischemic colitis ([place])    a. 02/2018 GIB - colonoscopy w/ isch colitis. Anticoagulation resumed.  . Long term current use of anticoagulant 03/02/2011  . LOW BACK PAIN SYNDROME 03/17/2009   Qualifier: Diagnosis of  By: Council Mechanic MD, Hilaria Ota   . Mixed hyperlipidemia   . 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 Other myelopathy   . Pain in limb   . Palpitations   . Paroxysmal Atrial Fibrillation ([place])    a. a. 01/2011 in setting of post-op complications including aspiration pna;  b. CHA2DS2VASc = 4--> Amio/Eliquis.  . Pneumonia 03/06/2003  . Spinal stenosis, unspecified region other than cervical   . 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 Syncope    a. 10/2017-->Event monitor: RSR, rare PACs/PVCs.  . Thoracic or lumbosacral neuritis or radiculitis, unspecified     PAST SURGICAL HISTORY: Past Surgical History:  Procedure Laterality Date  . CARDIOVERSION N/A 06/20/2017   Procedure: CARDIOVERSION;  Surgeon: [name], MD;  Location: [facility];  Service: Cardiovascular;  Laterality: N/A;  . CATARACT EXTRACTION W/ INTRAOCULAR LENS  IMPLANT, BILATERAL    .  CERVICAL FUSION  02/10/2011  . CHOLECYSTECTOMY  05/24/2018  . CHOLECYSTECTOMY N/A 05/24/2018   Procedure: LAPAROSCOPIC CHOLECYSTECTOMY;  Surgeon: Coralie Keens, MD;  Location: [place];  Service: General;  Laterality: N/A;  . COLONOSCOPY WITH PROPOFOL N/A 02/20/2018   Procedure: COLONOSCOPY WITH PROPOFOL;  Surgeon: Lucilla Lame, MD;  Location: [facility];  Service: Endoscopy;  Laterality: N/A;  . history of abd ultrasound  11/01   fatty liver  . MULTIPLE TOOTH EXTRACTIONS    . ORIF FIBULA FRACTURE Left 01/06/2017   Procedure: OPEN REDUCTION INTERNAL FIXATION (ORIF) FIBULA FRACTURE DISTAL FIBULA;  Surgeon: Melrose Nakayama, MD;  Location: [place];  Service: Orthopedics;  Laterality: Left;  Patient states has problems if he will have a tube in throat for Genera; Anesthesia    FAMILY HISTORY: Family History  Problem Relation Age of Onset  . Stroke Father   . Atrial fibrillation Brother        on coumadin  . Heart disease Brother        AFib- coumadin  . Stroke Paternal Grandfather   . Other Mother        hemorrhage  . Cancer Other        colon cancer at early age  . Prostate cancer Neg Hx   . Kidney cancer Neg Hx   . Bladder Cancer Neg Hx     SOCIAL HISTORY: Social History   Socioeconomic History  . Marital status: Married    Spouse name: Not on file  . Number of children: 2  . Years of education: 19  . Highest education level: High school graduate  Occupational History  . 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 Financial resource strain: Not hard at all  . Food insecurity:    Worry: Never true    Inability: Never true  . Transportation needs:    Medical: No    Non-medical: No  Tobacco Use  . Smoking status: Former Smoker    Years: 1.00  . Smokeless tobacco: Never Used  . Tobacco comment: stopped in 20's  Substance and Sexual Activity  . Alcohol use: No  . Drug use: No  . Sexual activity:  Not Currently  Lifestyle  . Physical activity:    Days per week: 7 days    Minutes per session: 10 min  . Stress: Not at all  Relationships  . Social connections:    Talks on phone: More than three times a week    Gets together: More than three times a week    Attends religious service: More than 4 times per year    Active member of club or organization: No    Attends meetings of clubs or organizations: Never    Relationship status: Married  . Intimate partner violence:    Fear of current or ex partner: No    Emotionally abused: No    Physically abused: No    Forced sexual activity: No  Other Topics Concern  . Not on file  Social History Narrative   Lives at home with his wife.   Left-handed (due to arthritis, he uses his right hand more now).   Caffeine use: 2 cups per day.     PHYSICAL EXAM   Vitals:   01/21/19 0956  BP: 125/71  Pulse: 63  Weight: 191 lb (86.6 kg)  Height: 5\' 6"  (1.676 m)    Not recorded      Body mass index is 30.83 kg/m.  PHYSICAL EXAMNIATION:  Gen: NAD, conversant, well nourised, obese, well groomed                     Cardiovascular: Regular rate rhythm, no peripheral edema, warm, nontender. Eyes: Conjunctivae clear without exudates or hemorrhage Neck: Supple, no carotid bruits.  Limited range of motion of his neck turning Pulmonary: Clear to auscultation bilaterally   NEUROLOGICAL EXAM:  MENTAL STATUS: Speech:    Speech is normal; fluent and spontaneous with normal comprehension.  Cognition:     Orientation to time, place and person     Normal recent and remote memory     Normal Attention span and concentration     Normal Language, naming, repeating,spontaneous speech     [employer]  of knowledge   CRANIAL NERVES: CN II: Visual fields are full to confrontation.  Pupils are round equal and briskly reactive to light. CN III, IV, VI: extraocular movement are normal. No ptosis. CN V: Facial sensation is intact to pinprick in all 3  divisions bilaterally. Corneal responses are intact.  CN VII: Face is symmetric with normal eye closure and smile. CN VIII: Hearing is normal to rubbing fingers CN IX, X: Palate elevates symmetrically. Phonation is normal. CN XI: Head turning and shoulder shrug are intact CN XII: Tongue is midline with normal movements and no atrophy.  MOTOR: He has mild bilateral finger abduction weakness, mild to moderate bilateral hip flexion, knee flexion, extension, ankle dorsiflexion weakness  REFLEXES: Reflexes are 2+ and symmetric at the biceps, triceps, knees, and absent at ankles. Plantar responses are flexor.  SENSORY: Length dependent decreased light touch, pinprick vibratory sensation to ankle level  COORDINATION: Rapid alternating movements and fine finger movements are intact. There is no dysmetria on finger-to-nose and heel-knee-shin.    GAIT/STANCE: He rely on his walker to get up from seated position, stiff, wide-based, unsteady gait, dragging his right leg across the floor, decreased right arm swing,  DIAGNOSTIC DATA (LABS, IMAGING, TESTING) - I reviewed patient records, labs, notes, testing and imaging myself where available.   ASSESSMENT AND PLAN  Navon Kotowski Haecker is a 85 y.o. 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 Ph.D.  [facility] [address] [place], [place] [address] Ph: [phone] Fax: [phone]  CC: [name], DO

## 2019-01-23 DIAGNOSIS — I5032 Chronic diastolic (congestive) heart failure: Secondary | ICD-10-CM | POA: Diagnosis not present

## 2019-01-23 DIAGNOSIS — R0602 Shortness of breath: Secondary | ICD-10-CM | POA: Diagnosis not present

## 2019-01-28 ENCOUNTER — Telehealth: Payer: Self-pay | Admitting: Neurology

## 2019-01-28 NOTE — Telephone Encounter (Signed)
He will be getting physical therapy in the home.

## 2019-01-28 NOTE — Telephone Encounter (Signed)
Kecia from [facility] called to inform us that the pt will be able to be seen tomorrow.

## 2019-01-29 DIAGNOSIS — I110 Hypertensive heart disease with heart failure: Secondary | ICD-10-CM | POA: Diagnosis not present

## 2019-01-29 DIAGNOSIS — E782 Mixed hyperlipidemia: Secondary | ICD-10-CM | POA: Diagnosis not present

## 2019-01-29 DIAGNOSIS — I5032 Chronic diastolic (congestive) heart failure: Secondary | ICD-10-CM | POA: Diagnosis not present

## 2019-01-29 DIAGNOSIS — E559 Vitamin D deficiency, unspecified: Secondary | ICD-10-CM | POA: Diagnosis not present

## 2019-01-29 DIAGNOSIS — M4803 Spinal stenosis, cervicothoracic region: Secondary | ICD-10-CM | POA: Diagnosis not present

## 2019-01-29 DIAGNOSIS — M5000 Cervical disc disorder with myelopathy, unspecified cervical region: Secondary | ICD-10-CM | POA: Diagnosis not present

## 2019-01-29 DIAGNOSIS — R1314 Dysphagia, pharyngoesophageal phase: Secondary | ICD-10-CM | POA: Diagnosis not present

## 2019-01-29 DIAGNOSIS — M4802 Spinal stenosis, cervical region: Secondary | ICD-10-CM | POA: Diagnosis not present

## 2019-01-29 DIAGNOSIS — F329 Major depressive disorder, single episode, unspecified: Secondary | ICD-10-CM | POA: Diagnosis not present

## 2019-01-29 DIAGNOSIS — Z7901 Long term (current) use of anticoagulants: Secondary | ICD-10-CM | POA: Diagnosis not present

## 2019-01-29 DIAGNOSIS — G629 Polyneuropathy, unspecified: Secondary | ICD-10-CM | POA: Diagnosis not present

## 2019-01-29 DIAGNOSIS — M5010 Cervical disc disorder with radiculopathy, unspecified cervical region: Secondary | ICD-10-CM | POA: Diagnosis not present

## 2019-01-29 DIAGNOSIS — I480 Paroxysmal atrial fibrillation: Secondary | ICD-10-CM | POA: Diagnosis not present

## 2019-01-29 DIAGNOSIS — M48062 Spinal stenosis, lumbar region with neurogenic claudication: Secondary | ICD-10-CM | POA: Diagnosis not present

## 2019-01-29 DIAGNOSIS — M4722 Other spondylosis with radiculopathy, cervical region: Secondary | ICD-10-CM | POA: Diagnosis not present

## 2019-01-29 DIAGNOSIS — M4712 Other spondylosis with myelopathy, cervical region: Secondary | ICD-10-CM | POA: Diagnosis not present

## 2019-01-29 DIAGNOSIS — F419 Anxiety disorder, unspecified: Secondary | ICD-10-CM | POA: Diagnosis not present

## 2019-01-29 DIAGNOSIS — M21372 Foot drop, left foot: Secondary | ICD-10-CM | POA: Diagnosis not present

## 2019-01-30 NOTE — Telephone Encounter (Signed)
It appears that he had significant difficulty performing the PFT maneuvers. I'm not sure how informative they are. I will discuss with him further when he follows up  Dr Alva Garnet

## 2019-02-01 NOTE — Telephone Encounter (Signed)
Spoke to patient's daughter, she is aware of results and will follow-up at scheduled follow-up apt.

## 2019-02-01 NOTE — Telephone Encounter (Signed)
Attempted to call Jenny Reichmann, no answer no voicemail.

## 2019-02-02 ENCOUNTER — Ambulatory Visit
Admission: RE | Admit: 2019-02-02 | Discharge: 2019-02-02 | Disposition: A | Discharge status: Home / Self Care | Payer: PPO | Source: Ambulatory Visit | Attending: Neurology | Admitting: Neurology

## 2019-02-02 DIAGNOSIS — G8929 Other chronic pain: Secondary | ICD-10-CM

## 2019-02-02 DIAGNOSIS — M5441 Lumbago with sciatica, right side: Secondary | ICD-10-CM

## 2019-02-02 DIAGNOSIS — R269 Unspecified abnormalities of gait and mobility: Secondary | ICD-10-CM | POA: Diagnosis not present

## 2019-02-02 DIAGNOSIS — R202 Paresthesia of skin: Secondary | ICD-10-CM

## 2019-02-02 DIAGNOSIS — G3281 Cerebellar ataxia in diseases classified elsewhere: Secondary | ICD-10-CM

## 2019-02-02 DIAGNOSIS — M545 Low back pain: Secondary | ICD-10-CM | POA: Diagnosis not present

## 2019-02-04 ENCOUNTER — Telehealth: Payer: Self-pay | Admitting: Neurology

## 2019-02-04 DIAGNOSIS — F329 Major depressive disorder, single episode, unspecified: Secondary | ICD-10-CM | POA: Diagnosis not present

## 2019-02-04 DIAGNOSIS — M4712 Other spondylosis with myelopathy, cervical region: Secondary | ICD-10-CM | POA: Diagnosis not present

## 2019-02-04 DIAGNOSIS — I480 Paroxysmal atrial fibrillation: Secondary | ICD-10-CM | POA: Diagnosis not present

## 2019-02-04 DIAGNOSIS — M4803 Spinal stenosis, cervicothoracic region: Secondary | ICD-10-CM | POA: Diagnosis not present

## 2019-02-04 DIAGNOSIS — I110 Hypertensive heart disease with heart failure: Secondary | ICD-10-CM | POA: Diagnosis not present

## 2019-02-04 DIAGNOSIS — M4722 Other spondylosis with radiculopathy, cervical region: Secondary | ICD-10-CM | POA: Diagnosis not present

## 2019-02-04 DIAGNOSIS — R1314 Dysphagia, pharyngoesophageal phase: Secondary | ICD-10-CM | POA: Diagnosis not present

## 2019-02-04 DIAGNOSIS — G629 Polyneuropathy, unspecified: Secondary | ICD-10-CM | POA: Diagnosis not present

## 2019-02-04 DIAGNOSIS — E782 Mixed hyperlipidemia: Secondary | ICD-10-CM | POA: Diagnosis not present

## 2019-02-04 DIAGNOSIS — M5000 Cervical disc disorder with myelopathy, unspecified cervical region: Secondary | ICD-10-CM | POA: Diagnosis not present

## 2019-02-04 DIAGNOSIS — E559 Vitamin D deficiency, unspecified: Secondary | ICD-10-CM | POA: Diagnosis not present

## 2019-02-04 DIAGNOSIS — Z7901 Long term (current) use of anticoagulants: Secondary | ICD-10-CM | POA: Diagnosis not present

## 2019-02-04 DIAGNOSIS — M5010 Cervical disc disorder with radiculopathy, unspecified cervical region: Secondary | ICD-10-CM | POA: Diagnosis not present

## 2019-02-04 DIAGNOSIS — I5032 Chronic diastolic (congestive) heart failure: Secondary | ICD-10-CM | POA: Diagnosis not present

## 2019-02-04 DIAGNOSIS — M4802 Spinal stenosis, cervical region: Secondary | ICD-10-CM | POA: Diagnosis not present

## 2019-02-04 DIAGNOSIS — M21372 Foot drop, left foot: Secondary | ICD-10-CM | POA: Diagnosis not present

## 2019-02-04 DIAGNOSIS — F419 Anxiety disorder, unspecified: Secondary | ICD-10-CM | POA: Diagnosis not present

## 2019-02-04 DIAGNOSIS — M48062 Spinal stenosis, lumbar region with neurogenic claudication: Secondary | ICD-10-CM | POA: Diagnosis not present

## 2019-02-04 NOTE — Telephone Encounter (Signed)
Spoke to his daughter, Jenny Reichmann (on [place]).  She is aware of his MRI results and verbalized understanding.

## 2019-02-04 NOTE — Telephone Encounter (Signed)
Please call patient, MRI of lumbar spine showed severe disc degenerative changes, with severe spinal stenosis at L4-5, L5-S1 level, progressed compared to previous MRI in July 2017,  MRI of cervical spine showed multilevel degenerative changes, evidence of posterior cervical fusion from C2-T1, with posterior decompression from C3-C6, but no evidence of significant canal stenosis or spinal cord compression.  MRI of the brain showed generalized atrophy supratentorium small vessel disease no acute abnormality.  I will see him on February 20, 2019 follow-up  INTERPRETING PHYSICIAN: Abnormal MRI scan of the lumbar spine showing severe disc degenerative changes throughout as well as prominent facet and ligamentum flavum hypertrophy resulting in severe spinal stenosis at L4-5 and L5-S1 with likely bilateral exiting nerve root entrapment.  There also bilateral sever foraminal narrowing at L2-3 and L3-4 as well.  These changes appear to have progressed slightly compared with previous MRI from 06/21/2016  IMPRESSION: Abnormal MRI scan cervical spine showing stable postoperative changes of posterior cervical fusion from C2-T1 along with posterior decompression from C3-C6 with minor disc signal abnormalities but no significant compression.  IMPRESSION: Abnormal MRI scan of the brain showing mild age-appropriate changes of chronic microvascular ischemia and generalized cerebral atrophy.

## 2019-02-09 ENCOUNTER — Other Ambulatory Visit: Payer: Self-pay | Admitting: Internal Medicine

## 2019-02-20 ENCOUNTER — Other Ambulatory Visit: Payer: Self-pay

## 2019-02-20 ENCOUNTER — Ambulatory Visit: Payer: PPO | Admitting: Neurology

## 2019-02-20 ENCOUNTER — Ambulatory Visit (INDEPENDENT_AMBULATORY_CARE_PROVIDER_SITE_OTHER): Payer: PPO | Admitting: Neurology

## 2019-02-20 DIAGNOSIS — R269 Unspecified abnormalities of gait and mobility: Secondary | ICD-10-CM | POA: Diagnosis not present

## 2019-02-20 DIAGNOSIS — M48062 Spinal stenosis, lumbar region with neurogenic claudication: Secondary | ICD-10-CM | POA: Diagnosis not present

## 2019-02-20 DIAGNOSIS — G8929 Other chronic pain: Secondary | ICD-10-CM

## 2019-02-20 DIAGNOSIS — M5441 Lumbago with sciatica, right side: Secondary | ICD-10-CM

## 2019-02-20 DIAGNOSIS — G3281 Cerebellar ataxia in diseases classified elsewhere: Secondary | ICD-10-CM

## 2019-02-20 DIAGNOSIS — R202 Paresthesia of skin: Secondary | ICD-10-CM

## 2019-02-20 DIAGNOSIS — M4712 Other spondylosis with myelopathy, cervical region: Secondary | ICD-10-CM

## 2019-02-20 NOTE — Progress Notes (Signed)
PATIENT: Eric [name] DOB: 10/02/1932  No chief complaint on file.    HISTORICAL  Eric [name] is a 85 years old male, seen in request by her primary care physician Dr. Mariea Clonts, Jonelle Sidle for evaluation of weakness, initial evaluation was on January 21, 2019.  He is also seen by pulmonologist Dr. Merton Border. He is with his daughter Eric [name] and wife at today's visit.  I have reviewed and summarized the referring note from the referring physician.   He has past medical history of atrial fibrillation on Eliquis, amiodarone, congestive heart failure, hypertension, hyperlipidemia, he had history of cervical decompression in 2012 due to cervical myelopathy, prior to the cervical decompression, he presented with gait abnormality, neck pain, I was able to review MRI of cervical spine in December 2011, multilevel spondylosis with posterior osteophyte, elucidate spurring, and facet hypertrophy, most advanced at the C4-5, with severe spinal stenosis, with cord flattening, and abnormal cord signal, also has lesser degree of central canal stenosis at C5-6, C6-7, C7-T1, without abnormal cord signal,  He recovered well after surgery, for a while without the the cane from 2012-2016, he was able to move in the yard, driving,  but has some persistent mildly unsteady gait, bilateral fingertips numbness, clumsiness  Then he suffered a fall in August 2016, with left foot, ankle fracture, require multiple surgery, then he began to rely on his walker.  He now complains of low back pain, radiating pain to right leg, constant right foot numbness, also persistent bilateral fingertips numbness, clumsiness, difficulty holding a cup, he has neck pain, limited range of motion of his neck,      REVIEW OF SYSTEMS: Full 14 system review of systems performed and notable only for swelling legs, trouble swallowing, shortness of breath, blood in stool, incontinence, diarrhea, incontinence, difficulty swallowing, restless leg, not enough  sleep All other review of systems were negative.  ALLERGIES: Allergies  Allergen Reactions  . Morphine And Related Shortness Of Breath  . Percocet [Oxycodone-Acetaminophen] Shortness Of Breath  . Valium Shortness Of Breath    HOME MEDICATIONS: Current Outpatient Medications  Medication Sig Dispense Refill  . amiodarone (PACERONE) 200 MG tablet TAKE 1 TABLET BY MOUTH EVERY DAY 30 tablet 5  . apixaban (ELIQUIS) 5 MG TABS tablet Take 1 tablet (5 mg total) by mouth 2 (two) times daily. 60 tablet 6  . cetirizine (ZYRTEC) 10 MG tablet TAKE 1 TABLET BY MOUTH EVERY DAY 90 tablet 0  . Cholecalciferol (VITAMIN D) 1000 UNITS capsule Take one tablet once daily 90 capsule 3  . fluticasone (FLONASE) 50 MCG/ACT nasal spray Place 2 sprays daily into both nostrils. 16 g 10  . fluticasone furoate-vilanterol (BREO ELLIPTA) 100-25 MCG/INH AEPB Inhale 1 puff into the lungs daily.    . furosemide (LASIX) 40 MG tablet Take 1 tablet (40 mg total) by mouth daily. 90 tablet 3  . gabapentin (NEURONTIN) 300 MG capsule Take 1 capsule (300 mg total) by mouth 2 (two) times daily. 180 capsule 1  . metoprolol tartrate (LOPRESSOR) 25 MG tablet Take 1 tablet (25 mg total) by mouth 2 (two) times daily. 60 tablet 11  . pantoprazole (PROTONIX) 40 MG tablet Take 1 tablet (40 mg total) by mouth at bedtime. 90 tablet 1  . potassium chloride SA (K-DUR,KLOR-CON) 10 MEQ tablet Take 1 tablet (10 mEq total) by mouth daily. 90 tablet 3  . pravastatin (PRAVACHOL) 80 MG tablet Take 1 tablet (80 mg total) by mouth daily. 90 tablet 3  .  traMADol (ULTRAM) 50 MG tablet Take 1 tablet (50 mg total) by mouth every 6 (six) hours as needed. 20 tablet 0   No current facility-administered medications for this visit.     PAST MEDICAL HISTORY: Past Medical History:  Diagnosis Date  . Anxiety   . Arthritis   . Atypical chest pain    a. 05/2017 MV: no ischemia, EF 79%.  . Chest pain 10/20/2017  . Chronic diastolic CHF (congestive heart  failure) ([place])    a. 01/2011 Echo: EF 50-55%, gr1 DD, mild AI, nl RV fxn, mild TR/PR; b. 10/2017 Echo: EF 60-65%, mild LVH, gr2 DD.  [place] DDD (degenerative disc disease), cervical   . Depressive disorder, not elsewhere classified   . Difficult intubation   . Dysphagia, oral phase   . Dyspnea   . Edema   . Gallstones    a. Symptomatic - s/p lap chole 05/2018.  [place] History of DVT (deep vein thrombosis)   . History of kidney stones   . Hypertension   . Hypoxemia   . Impacted cerumen   . Ischemic colitis ([place])    a. 02/2018 GIB - colonoscopy w/ isch colitis. Anticoagulation resumed.  . Long term current use of anticoagulant 03/02/2011  . LOW BACK PAIN SYNDROME 03/17/2009   Qualifier: Diagnosis of  By: Council Mechanic MD, Hilaria Ota   . Mixed hyperlipidemia   . 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 Other myelopathy   . Pain in limb   . Palpitations   . Paroxysmal Atrial Fibrillation ([place])    a. a. 01/2011 in setting of post-op complications including aspiration pna;  b. CHA2DS2VASc = 4--> Amio/Eliquis.  . Pneumonia 03/06/2003  . Spinal stenosis, unspecified region other than cervical   . 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 Syncope    a. 10/2017-->Event monitor: RSR, rare PACs/PVCs.  . Thoracic or lumbosacral neuritis or radiculitis, unspecified     PAST SURGICAL HISTORY: Past Surgical History:  Procedure Laterality Date  . CARDIOVERSION N/A 06/20/2017   Procedure: CARDIOVERSION;  Surgeon: [name], MD;  Location: [facility];  Service: Cardiovascular;  Laterality: N/A;  . CATARACT EXTRACTION W/ INTRAOCULAR LENS  IMPLANT, BILATERAL    . CERVICAL FUSION  02/10/2011  . CHOLECYSTECTOMY  05/24/2018  . CHOLECYSTECTOMY N/A 05/24/2018   Procedure: LAPAROSCOPIC CHOLECYSTECTOMY;  Surgeon: Coralie Keens, MD;  Location: [place];  Service: General;  Laterality: N/A;  . COLONOSCOPY WITH PROPOFOL N/A 02/20/2018   Procedure:  COLONOSCOPY WITH PROPOFOL;  Surgeon: Lucilla Lame, MD;  Location: [facility];  Service: Endoscopy;  Laterality: N/A;  . history of abd ultrasound  11/01   fatty liver  . MULTIPLE TOOTH EXTRACTIONS    . ORIF FIBULA FRACTURE Left 01/06/2017   Procedure: OPEN REDUCTION INTERNAL FIXATION (ORIF) FIBULA FRACTURE DISTAL FIBULA;  Surgeon: Melrose Nakayama, MD;  Location: [place];  Service: Orthopedics;  Laterality: Left;  Patient states has problems if he will have a tube in throat for [place]; Anesthesia    FAMILY HISTORY: Family History  Problem Relation Age of Onset  . Stroke Father   . Atrial fibrillation Brother        on coumadin  . Heart disease Brother        AFib- coumadin  . Stroke Paternal Grandfather   . Other Mother        hemorrhage  . Cancer Other  colon cancer at early age  . Prostate cancer Neg Hx   . Kidney cancer Neg Hx   . Bladder Cancer Neg Hx     SOCIAL HISTORY: Social History   Socioeconomic History  . Marital status: Married    Spouse name: Not on file  . Number of children: 2  . Years of education: 36  . Highest education level: High school graduate  Occupational History  . 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 Financial resource strain: Not hard at all  . Food insecurity:    Worry: Never true    Inability: Never true  . Transportation needs:    Medical: No    Non-medical: No  Tobacco Use  . Smoking status: Former Smoker    Years: 1.00  . Smokeless tobacco: Never Used  . Tobacco comment: stopped in 20's  Substance and Sexual Activity  . Alcohol use: No  . Drug use: No  . Sexual activity: Not Currently  Lifestyle  . Physical activity:    Days per week: 7 days    Minutes per session: 10 min  . Stress: Not at all  Relationships  . Social connections:    Talks on phone: More than three times a week    Gets together: More than three times a week    Attends  religious service: More than 4 times per year    Active member of club or organization: No    Attends meetings of clubs or organizations: Never    Relationship status: Married  . Intimate partner violence:    Fear of current or ex partner: No    Emotionally abused: No    Physically abused: No    Forced sexual activity: No  Other Topics Concern  . Not on file  Social History Narrative   Lives at home with his wife.   Left-handed (due to arthritis, he uses his right hand more now).   Caffeine use: 2 cups per day.     PHYSICAL EXAM   There were no vitals filed for this visit.  Not recorded      There is no height or weight on file to calculate BMI.  PHYSICAL EXAMNIATION:  Gen: NAD, conversant, well nourised, obese, well groomed                     Cardiovascular: Regular rate rhythm, no peripheral edema, warm, nontender. Eyes: Conjunctivae clear without exudates or hemorrhage Neck: Supple, no carotid bruits.  Limited range of motion of his neck turning Pulmonary: Clear to auscultation bilaterally   NEUROLOGICAL EXAM:  MENTAL STATUS: Speech:    Speech is normal; fluent and spontaneous with normal comprehension.  Cognition:     Orientation to time, place and person     Normal recent and remote memory     Normal Attention span and concentration     Normal Language, naming, repeating,spontaneous speech     Fund of knowledge   CRANIAL NERVES: CN II: Visual fields are full to confrontation.  Pupils are round equal and briskly reactive to light. CN III, IV, VI: extraocular movement are normal. No ptosis. CN V: Facial sensation is intact to pinprick in all 3 divisions bilaterally. Corneal responses are intact.  CN VII: Face is symmetric with normal eye closure and smile. CN VIII: Hearing is normal to rubbing fingers CN IX, X: Palate elevates symmetrically. Phonation is  normal. CN XI: Head turning and shoulder shrug are intact CN XII: Tongue is midline with normal movements  and no atrophy.  MOTOR: He has mild bilateral finger abduction weakness, mild to moderate bilateral hip flexion, knee flexion, extension, ankle dorsiflexion weakness  REFLEXES: Reflexes are 2+ and symmetric at the biceps, triceps, knees, and absent at ankles. Plantar responses are flexor.  SENSORY: Length dependent decreased light touch, pinprick vibratory sensation to ankle level  COORDINATION: Rapid alternating movements and fine finger movements are intact. There is no dysmetria on finger-to-nose and heel-knee-shin.    GAIT/STANCE: He rely on his walker to get up from seated position, stiff, wide-based, unsteady gait, dragging his right leg across the floor, decreased right arm swing,  DIAGNOSTIC DATA (LABS, IMAGING, TESTING) - I reviewed patient records, labs, notes, testing and imaging myself where available.   ASSESSMENT AND PLAN  Fuad Forget Norenberg is a 85 y.o. 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 Ph.D.  [facility] [address] [place], [place] [address] Ph: [phone] Fax: [phone]  CC: [name], DO

## 2019-02-21 ENCOUNTER — Other Ambulatory Visit: Payer: Self-pay | Admitting: Cardiovascular Disease

## 2019-02-21 DIAGNOSIS — I5032 Chronic diastolic (congestive) heart failure: Secondary | ICD-10-CM | POA: Diagnosis not present

## 2019-02-21 DIAGNOSIS — R062 Wheezing: Secondary | ICD-10-CM | POA: Diagnosis not present

## 2019-02-25 NOTE — Procedures (Signed)
Full Name: Eric [name] Gender: Male MRN #: [id number] Date of Birth: 29-Jun-1932    Visit Date: 02/20/2019 10:22 Age: 85 Years 24 Months Old Examining Physician: Marcial Pacas, MD  Referring Physician: Marcial Pacas, MD History: 85 year old male with progressive gait abnormality, low back pain  Summary of the tests:  Nerve conduction study: Bilateral sural, right superficial peroneal sensory responses showed mildly to moderately decreased snap amplitude.  Left superficial peroneal sensory response was absent.  Right median sensory response was normal.  Right ulnar and radial sensory responses showed moderately prolonged peak latency with normal to mildly decreased snap amplitude.  Bilateral peroneal motor responses were absent.  Left tibial motor responses showed mildly decreased conduction velocity.  Right tibial motor responses showed mildly decreased the C map amplitude, with moderately decreased conduction velocity.  Right radial motor responses were normal.  Right ulnar motor responses showed moderately prolonged distal latency, with normal C map amplitude, mildly slow conduction velocity.  Electromyography: Selected needle examinations were performed at bilateral lower extremity muscles, bilateral lumbosacral paraspinals, right upper extremity muscles, and right cervical paraspinal muscles.  There is evidence of active denervation, chronic neuropathic changes involving bilateral L4-5 S1 myotomes.  There is also mild chronic neuropathic changes involving right C5-6-7 8 myotomes.  There is no evidence of active denervation at right cervical paraspinals.  There is evidence of active denervation at bilateral lower lumbar sacral paraspinals.   Conclusion: This is an abnormal study.  There is electrodiagnostic evidence of bilateral chronic lumbosacral radiculopathy, involving bilateral L4-5 S1 myotomes.  There is also evidence of mild chronic right cervical radiculopathy.     ------------------------------- Marcial Pacas, M.D. PhD  [facility] [place], [place] [address] Tel: [phone] Fax: [phone]        [facility]    Nerve / Sites Muscle Latency Ref. Amplitude Ref. Rel Amp Segments Distance Velocity Ref. Area    ms ms mV mV %  cm m/s m/s mVms  R Median - APB     Wrist APB 4.0 ?4.4 5.5 ?4.0 100 Wrist - APB 7   18.6     Upper arm APB 8.5  4.5  81.6 Upper arm - Wrist 23 50 ?49 18.1  R Ulnar - ADM     Wrist ADM 3.8 ?3.3 12.1 ?6.0 100 Wrist - ADM 7   32.1     B.Elbow ADM 8.3  10.2  83.8 B.Elbow - Wrist 21 46 ?49 29.2     A.Elbow ADM 10.6  9.5  93.7 A.Elbow - B.Elbow 10 45 ?49 28.5         A.Elbow - Wrist      R Peroneal - EDB     Ankle EDB NR ?6.5 NR ?2.0 NR Ankle - EDB 9   NR     Fib head EDB NR  NR  NR Fib head - Ankle 28 NR ?44 NR  L Peroneal - EDB     Ankle EDB NR ?6.5 NR ?2.0 NR Ankle - EDB 9   NR     Fib head EDB NR  NR  NR Fib head - Ankle 29 NR ?44 NR  R Tibial - AH     Ankle AH 3.3 ?5.8 3.6 ?4.0 100 Ankle - AH 9   11.3     Pop fossa AH 15.5  1.5  42.1 Pop fossa - Ankle 35 29 ?41 4.5  L Tibial - AH     Ankle  AH 4.0 ?5.8 5.5 ?4.0 100 Ankle - AH 9   13.1     Pop fossa AH 14.2  2.4  42.8 Pop fossa - Ankle 34 33 ?41 7.4                  SNC    Nerve / Sites Rec. Site Peak Lat Ref.  Amp Ref. Segments Distance Peak Diff Ref.    ms ms V V  cm ms ms  R Radial - Anatomical snuff box (Forearm)     Forearm Wrist 3.1 ?2.9 12 ?15 Forearm - Wrist 10    R Sural - Ankle (Calf)     Calf Ankle 3.7 ?4.4 2 ?6 Calf - Ankle 14    L Sural - Ankle (Calf)     Calf Ankle 4.1 ?4.4 4 ?6 Calf - Ankle 14    R Superficial peroneal - Ankle     Lat leg Ankle 4.0 ?4.4 4 ?6 Lat leg - Ankle 14    L Superficial peroneal - Ankle     Lat leg Ankle NR ?4.4 NR ?6 Lat leg - Ankle 14    R Median, Ulnar - Transcarpal comparison     Median Palm Wrist 2.2 ?2.2 30 ?35 Median Palm - Wrist 8       Ulnar Palm Wrist 2.7 ?2.2 7 ?12 Ulnar Palm - Wrist 8           Median Palm - Ulnar Palm  -0.5 ?0.4  R Median - Orthodromic (Dig II, Mid palm)     Dig II Wrist 3.3 ?3.4 10 ?10 Dig II - Wrist 13    R Ulnar - Orthodromic, (Dig V, Mid palm)     Dig V Wrist 3.5 ?3.1 5 ?5 Dig V - Wrist 17                       F  Wave    Nerve F Lat Ref.   ms ms  R Tibial - AH 61.7 ?56.0  R Median - APB 36.7 ?31.0  R Ulnar - ADM 33.3 ?32.0  L Tibial - AH 58.4 ?56.0             EMG       EMG Summary Table    Spontaneous MUAP Recruitment  Muscle IA Fib PSW Fasc Other Amp Dur. Poly Pattern  R. Tibialis anterior Increased None None None _______ Increased Increased Normal Reduced  R. Tibialis posterior Increased None None None _______ Increased Increased Normal Reduced  R. Peroneus longus Increased None None None _______ Increased Increased Normal Reduced  R. Gastrocnemius (Medial head) Increased None None None _______ Increased Increased Normal Reduced  R. Vastus lateralis Normal None None None _______ Increased Increased 1+ Reduced  L. Tibialis anterior Increased 1+ 1+ None _______ Increased Increased 1+ Reduced  L. Peroneus longus Increased None None None _______ Increased Increased 1+ Reduced  L. Gastrocnemius (Medial head) Increased None None None _______ Increased Increased 1+ Reduced  L. Vastus lateralis Increased None None None _______ Increased Increased 1+ Reduced  R. Lumbar paraspinals (mid) Increased None None None _______ Normal Normal Normal Normal  R. Lumbar paraspinals (low) Increased 1+ 1+ None _______ Increased Increased 1+ Normal  L. Lumbar paraspinals (mid) Increased None None None _______ Normal Normal Normal Normal  L. Lumbar paraspinals (low) Increased 1+ None None _______ Increased Increased 1+ Normal  R. First dorsal interosseous Increased None None None _______ Increased Normal Normal Reduced  R. Pronator teres Normal None None None _______ Increased Normal Normal Reduced  R. Brachioradialis Normal None None None _______ Normal Normal  Normal Reduced  R. Biceps brachii Normal None None None _______ Normal Normal Normal Reduced  R. Deltoid Normal None None None _______ Normal Normal Normal Reduced  R. Cervical paraspinals Normal None None None _______ Normal Normal Normal Normal

## 2019-02-25 NOTE — Progress Notes (Signed)
PATIENT: Eric [name] DOB: 04-Jun-1932  No chief complaint on file.    HISTORICAL  Eric [name] is a 85 years old male, seen in request by her primary care physician Dr. Mariea Clonts, Jonelle Sidle for evaluation of weakness, initial evaluation was on January 21, 2019.  He is also seen by pulmonologist Dr. Merton Border. He is with his daughter Jenny Reichmann and wife at today's visit.  I have reviewed and summarized the referring note from the referring physician.   He has past medical history of atrial fibrillation on Eliquis, amiodarone, congestive heart failure, hypertension, hyperlipidemia, he had history of cervical decompression in 2012 due to cervical myelopathy, prior to the cervical decompression, he presented with gait abnormality, neck pain, I was able to review MRI of cervical spine in December 2011, multilevel spondylosis with posterior osteophyte, elucidate spurring, and facet hypertrophy, most advanced at the C4-5, with severe spinal stenosis, with cord flattening, and abnormal cord signal, also has lesser degree of central canal stenosis at C5-6, C6-7, C7-T1, without abnormal cord signal,  He recovered well after surgery, for a while without the the cane from 2012-2016, he was able to move in the yard, driving,  but has some persistent mildly unsteady gait, bilateral fingertips numbness, clumsiness  Then he suffered a fall in August 2016, with left foot, ankle fracture, require multiple surgery, then he began to rely on his walker.  He now complains of low back pain, radiating pain to right leg, constant right foot numbness, also persistent bilateral fingertips numbness, clumsiness, difficulty holding a cup, he has neck pain, limited range of motion of his neck,     UPDATE February 20 2019: Patient return for EMG nerve conduction study today, which showed evidence of chronic bilateral lumbosacral radiculopathy, chronic right cervical radiculopathy, with superimposed mild length dependent axonal peripheral  neuropathy  We personally reviewed MRI of lumbar in February 2020, multilevel degenerative changes, severe spinal stenosis at L4-5, L5-S1, with bilateral nerve root compression, MRI of cervical spine, stable postoperative changes of posterior cervical fusion from C2-T1, with posterior decompression from C3-C6 with minor disc signal abnormality, but no significant compression  MRI of brain showed mild age-related chronic atrophy, supratentorium small vessel disease REVIEW OF SYSTEMS: Full 14 system review of systems performed and notable only for swelling legs, trouble swallowing, shortness of breath, blood in stool, incontinence, diarrhea, incontinence, difficulty swallowing, restless leg, not enough sleep All other review of systems were negative.  ALLERGIES: Allergies  Allergen Reactions  . Morphine And Related Shortness Of Breath  . Percocet [Oxycodone-Acetaminophen] Shortness Of Breath  . Valium Shortness Of Breath    HOME MEDICATIONS: Current Outpatient Medications  Medication Sig Dispense Refill  . amiodarone (PACERONE) 200 MG tablet TAKE 1 TABLET BY MOUTH EVERY DAY 30 tablet 8  . apixaban (ELIQUIS) 5 MG TABS tablet Take 1 tablet (5 mg total) by mouth 2 (two) times daily. 60 tablet 6  . cetirizine (ZYRTEC) 10 MG tablet TAKE 1 TABLET BY MOUTH EVERY DAY 90 tablet 0  . Cholecalciferol (VITAMIN D) 1000 UNITS capsule Take one tablet once daily 90 capsule 3  . fluticasone (FLONASE) 50 MCG/ACT nasal spray Place 2 sprays daily into both nostrils. 16 g 10  . fluticasone furoate-vilanterol (BREO ELLIPTA) 100-25 MCG/INH AEPB Inhale 1 puff into the lungs daily.    . furosemide (LASIX) 40 MG tablet Take 1 tablet (40 mg total) by mouth daily. 90 tablet 3  . gabapentin (NEURONTIN) 300 MG capsule Take 1 capsule (300  mg total) by mouth 2 (two) times daily. 180 capsule 1  . metoprolol tartrate (LOPRESSOR) 25 MG tablet Take 1 tablet (25 mg total) by mouth 2 (two) times daily. 60 tablet 11  .  pantoprazole (PROTONIX) 40 MG tablet Take 1 tablet (40 mg total) by mouth at bedtime. 90 tablet 1  . potassium chloride SA (K-DUR,KLOR-CON) 10 MEQ tablet Take 1 tablet (10 mEq total) by mouth daily. 90 tablet 3  . pravastatin (PRAVACHOL) 80 MG tablet Take 1 tablet (80 mg total) by mouth daily. 90 tablet 3  . traMADol (ULTRAM) 50 MG tablet Take 1 tablet (50 mg total) by mouth every 6 (six) hours as needed. 20 tablet 0   No current facility-administered medications for this visit.     PAST MEDICAL HISTORY: Past Medical History:  Diagnosis Date  . Anxiety   . Arthritis   . Atypical chest pain    a. 05/2017 MV: no ischemia, EF 79%.  . Chest pain 10/20/2017  . Chronic diastolic CHF (congestive heart failure) ([place])    a. 01/2011 Echo: EF 50-55%, gr1 DD, mild AI, nl RV fxn, mild TR/PR; b. 10/2017 Echo: EF 60-65%, mild LVH, gr2 DD.  Marland Kitchen DDD (degenerative disc disease), cervical   . Depressive disorder, not elsewhere classified   . Difficult intubation   . Dysphagia, oral phase   . Dyspnea   . Edema   . Gallstones    a. Symptomatic - s/p lap chole 05/2018.  Marland Kitchen History of DVT (deep vein thrombosis)   . History of kidney stones   . Hypertension   . Hypoxemia   . Impacted cerumen   . Ischemic colitis ([place])    a. 02/2018 GIB - colonoscopy w/ isch colitis. Anticoagulation resumed.  . Long term current use of anticoagulant 03/02/2011  . LOW BACK PAIN SYNDROME 03/17/2009   Qualifier: Diagnosis of  By: Council Mechanic MD, Hilaria Ota   . Mixed hyperlipidemia   . 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 Other myelopathy   . Pain in limb   . Palpitations   . Paroxysmal Atrial Fibrillation ([place])    a. a. 01/2011 in setting of post-op complications including aspiration pna;  b. CHA2DS2VASc = 4--> Amio/Eliquis.  . Pneumonia 03/06/2003  . Spinal stenosis, unspecified region other than cervical   . 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 Syncope     a. 10/2017-->Event monitor: RSR, rare PACs/PVCs.  . Thoracic or lumbosacral neuritis or radiculitis, unspecified     PAST SURGICAL HISTORY: Past Surgical History:  Procedure Laterality Date  . CARDIOVERSION N/A 06/20/2017   Procedure: CARDIOVERSION;  Surgeon: [name], MD;  Location: [facility];  Service: Cardiovascular;  Laterality: N/A;  . CATARACT EXTRACTION W/ INTRAOCULAR LENS  IMPLANT, BILATERAL    . CERVICAL FUSION  02/10/2011  . CHOLECYSTECTOMY  05/24/2018  . CHOLECYSTECTOMY N/A 05/24/2018   Procedure: LAPAROSCOPIC CHOLECYSTECTOMY;  Surgeon: Coralie Keens, MD;  Location: [facility];  Service: General;  Laterality: N/A;  . COLONOSCOPY WITH PROPOFOL N/A 02/20/2018   Procedure: COLONOSCOPY WITH PROPOFOL;  Surgeon: Lucilla Lame, MD;  Location: [facility];  Service: Endoscopy;  Laterality: N/A;  . history of abd ultrasound  11/01   fatty liver  . MULTIPLE TOOTH EXTRACTIONS    . ORIF FIBULA FRACTURE Left 01/06/2017   Procedure: OPEN REDUCTION INTERNAL FIXATION (ORIF) FIBULA FRACTURE DISTAL FIBULA;  Surgeon: Melrose Nakayama, MD;  Location:  [facility];  Service: Orthopedics;  Laterality: Left;  Patient states has problems if he will have a tube in throat for Genera; Anesthesia    FAMILY HISTORY: Family History  Problem Relation Age of Onset  . Stroke Father   . Atrial fibrillation Brother        on coumadin  . Heart disease Brother        AFib- coumadin  . Stroke Paternal Grandfather   . Other Mother        hemorrhage  . Cancer Other        colon cancer at early age  . Prostate cancer Neg Hx   . Kidney cancer Neg Hx   . Bladder Cancer Neg Hx     SOCIAL HISTORY: Social History   Socioeconomic History  . Marital status: Married    Spouse name: Not on file  . Number of children: 2  . Years of education: 21  . Highest education level: High school graduate  Occupational History  . 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 Financial resource strain: Not hard at all  . Food insecurity:    Worry: Never true    Inability: Never true  . Transportation needs:    Medical: No    Non-medical: No  Tobacco Use  . Smoking status: Former Smoker    Years: 1.00  . Smokeless tobacco: Never Used  . Tobacco comment: stopped in 20's  Substance and Sexual Activity  . Alcohol use: No  . Drug use: No  . Sexual activity: Not Currently  Lifestyle  . Physical activity:    Days per week: 7 days    Minutes per session: 10 min  . Stress: Not at all  Relationships  . Social connections:    Talks on phone: More than three times a week    Gets together: More than three times a week    Attends religious service: More than 4 times per year    Active member of club or organization: No    Attends meetings of clubs or organizations: Never    Relationship status: Married  . Intimate partner violence:    Fear of current or ex partner: No    Emotionally abused: No    Physically abused: No    Forced sexual activity: No  Other Topics Concern  . Not on file  Social History Narrative   Lives at home with his wife.   Left-handed (due to arthritis, he uses his right hand more now).   Caffeine use: 2 cups per day.     PHYSICAL EXAM   There were no vitals filed for this visit.  Not recorded      There is no height or weight on file to calculate BMI.  PHYSICAL EXAMNIATION:  Gen: NAD, conversant, well nourised, obese, well groomed                     Cardiovascular: Regular rate rhythm, no peripheral edema, warm, nontender. Eyes: Conjunctivae clear without exudates or hemorrhage Neck: Supple, no carotid bruits.  Limited range of motion of his neck turning Pulmonary: Clear to auscultation bilaterally   NEUROLOGICAL EXAM:  MENTAL STATUS: Speech:    Speech is normal; fluent and spontaneous with normal comprehension.  Cognition:     Orientation to time, place and person     Normal recent and  remote memory  Normal Attention span and concentration     Normal Language, naming, repeating,spontaneous speech     Fund of knowledge   CRANIAL NERVES: CN II: Visual fields are full to confrontation.  Pupils are round equal and briskly reactive to light. CN III, IV, VI: extraocular movement are normal. No ptosis. CN V: Facial sensation is intact to pinprick in all 3 divisions bilaterally. Corneal responses are intact.  CN VII: Face is symmetric with normal eye closure and smile. CN VIII: Hearing is normal to rubbing fingers CN IX, X: Palate elevates symmetrically. Phonation is normal. CN XI: Head turning and shoulder shrug are intact CN XII: Tongue is midline with normal movements and no atrophy.  MOTOR: He has mild bilateral intrinsic hand muscle atrophy with mild bilateral finger abduction weakness, He has mild bilateral lower extremity spasticity, mild to moderate bilateral hip flexion, knee flexion, extension, ankle dorsiflexion weakness  REFLEXES: Reflexes are 2+ and symmetric at the biceps, triceps, knees, and absent at ankles. Plantar responses are flexor.  SENSORY: Length dependent decreased light touch, pinprick vibratory sensation to ankle level  COORDINATION: Rapid alternating movements and fine finger movements are intact. There is no dysmetria on finger-to-nose and heel-knee-shin.    GAIT/STANCE: He rely on his walker to get up from seated position, stiff, wide-based, unsteady gait, dragging his right leg across the floor, decreased right arm swing,  DIAGNOSTIC DATA (LABS, IMAGING, TESTING) - I reviewed patient records, labs, notes, testing and imaging myself where available.   ASSESSMENT AND PLAN  Kriston Pasquarello January is a 85 y.o. male   Severe lumbar spinal stenosis  At L4-5, L5-S1 Gait abnormality   Multifactorial, deconditioning, shortness of breath with minimal exertion, residual deficit from previous cervical spondylitic myelopathy, superimposed distal weakness  from severe lumbar stenosis  After discussed with patient, with his age, multiple vascular risk factors, difficulty breathing, I doubt he is a good candidate for surgery decompression of his lumbar stenosis  I have advised him continue conservative treatment,    Marcial Pacas, M.D. Ph.D.  [facility] [address] [place],  [address] Ph: [phone] Fax: [phone]  CC: [name], DO

## 2019-02-27 ENCOUNTER — Other Ambulatory Visit: Payer: Self-pay

## 2019-02-27 ENCOUNTER — Encounter (HOSPITAL_COMMUNITY): Payer: Self-pay | Admitting: Emergency Medicine

## 2019-02-27 ENCOUNTER — Emergency Department (HOSPITAL_COMMUNITY): Payer: PPO

## 2019-02-27 ENCOUNTER — Inpatient Hospital Stay (HOSPITAL_COMMUNITY)
Admission: EM | Admit: 2019-02-27 | Discharge: 2019-03-02 | DRG: 871 | Disposition: A | Discharge status: Home Health | Payer: PPO | Attending: Internal Medicine | Admitting: Internal Medicine

## 2019-02-27 DIAGNOSIS — I8290 Acute embolism and thrombosis of unspecified vein: Secondary | ICD-10-CM | POA: Diagnosis not present

## 2019-02-27 DIAGNOSIS — J42 Unspecified chronic bronchitis: Secondary | ICD-10-CM | POA: Diagnosis present

## 2019-02-27 DIAGNOSIS — R062 Wheezing: Secondary | ICD-10-CM | POA: Diagnosis not present

## 2019-02-27 DIAGNOSIS — J9621 Acute and chronic respiratory failure with hypoxia: Secondary | ICD-10-CM | POA: Diagnosis not present

## 2019-02-27 DIAGNOSIS — R05 Cough: Secondary | ICD-10-CM | POA: Diagnosis not present

## 2019-02-27 DIAGNOSIS — R0902 Hypoxemia: Secondary | ICD-10-CM

## 2019-02-27 DIAGNOSIS — F419 Anxiety disorder, unspecified: Secondary | ICD-10-CM | POA: Diagnosis not present

## 2019-02-27 DIAGNOSIS — R5381 Other malaise: Secondary | ICD-10-CM | POA: Diagnosis not present

## 2019-02-27 DIAGNOSIS — J40 Bronchitis, not specified as acute or chronic: Secondary | ICD-10-CM | POA: Diagnosis not present

## 2019-02-27 DIAGNOSIS — Z79899 Other long term (current) drug therapy: Secondary | ICD-10-CM | POA: Diagnosis not present

## 2019-02-27 DIAGNOSIS — N1831 Chronic kidney disease, stage 3a: Secondary | ICD-10-CM | POA: Diagnosis present

## 2019-02-27 DIAGNOSIS — K219 Gastro-esophageal reflux disease without esophagitis: Secondary | ICD-10-CM | POA: Diagnosis not present

## 2019-02-27 DIAGNOSIS — Z9981 Dependence on supplemental oxygen: Secondary | ICD-10-CM | POA: Diagnosis not present

## 2019-02-27 DIAGNOSIS — K760 Fatty (change of) liver, not elsewhere classified: Secondary | ICD-10-CM | POA: Diagnosis not present

## 2019-02-27 DIAGNOSIS — I5032 Chronic diastolic (congestive) heart failure: Secondary | ICD-10-CM | POA: Diagnosis not present

## 2019-02-27 DIAGNOSIS — R000 Tachycardia, unspecified: Secondary | ICD-10-CM | POA: Diagnosis not present

## 2019-02-27 DIAGNOSIS — N183 Chronic kidney disease, stage 3 unspecified: Secondary | ICD-10-CM | POA: Diagnosis present

## 2019-02-27 DIAGNOSIS — Z87891 Personal history of nicotine dependence: Secondary | ICD-10-CM | POA: Diagnosis not present

## 2019-02-27 DIAGNOSIS — I130 Hypertensive heart and chronic kidney disease with heart failure and stage 1 through stage 4 chronic kidney disease, or unspecified chronic kidney disease: Secondary | ICD-10-CM | POA: Diagnosis present

## 2019-02-27 DIAGNOSIS — I10 Essential (primary) hypertension: Secondary | ICD-10-CM | POA: Diagnosis not present

## 2019-02-27 DIAGNOSIS — R0602 Shortness of breath: Secondary | ICD-10-CM

## 2019-02-27 DIAGNOSIS — A4189 Other specified sepsis: Secondary | ICD-10-CM | POA: Diagnosis not present

## 2019-02-27 DIAGNOSIS — B348 Other viral infections of unspecified site: Secondary | ICD-10-CM | POA: Diagnosis not present

## 2019-02-27 DIAGNOSIS — B9789 Other viral agents as the cause of diseases classified elsewhere: Secondary | ICD-10-CM | POA: Diagnosis present

## 2019-02-27 DIAGNOSIS — R945 Abnormal results of liver function studies: Secondary | ICD-10-CM | POA: Diagnosis not present

## 2019-02-27 DIAGNOSIS — A419 Sepsis, unspecified organism: Secondary | ICD-10-CM | POA: Diagnosis present

## 2019-02-27 DIAGNOSIS — Z86718 Personal history of other venous thrombosis and embolism: Secondary | ICD-10-CM | POA: Diagnosis not present

## 2019-02-27 DIAGNOSIS — Z7951 Long term (current) use of inhaled steroids: Secondary | ICD-10-CM | POA: Diagnosis not present

## 2019-02-27 DIAGNOSIS — I5033 Acute on chronic diastolic (congestive) heart failure: Secondary | ICD-10-CM | POA: Diagnosis present

## 2019-02-27 DIAGNOSIS — R059 Cough, unspecified: Secondary | ICD-10-CM

## 2019-02-27 DIAGNOSIS — Z85828 Personal history of other malignant neoplasm of skin: Secondary | ICD-10-CM | POA: Diagnosis not present

## 2019-02-27 DIAGNOSIS — I480 Paroxysmal atrial fibrillation: Secondary | ICD-10-CM | POA: Diagnosis present

## 2019-02-27 DIAGNOSIS — R0689 Other abnormalities of breathing: Secondary | ICD-10-CM | POA: Diagnosis not present

## 2019-02-27 DIAGNOSIS — E782 Mixed hyperlipidemia: Secondary | ICD-10-CM | POA: Diagnosis present

## 2019-02-27 DIAGNOSIS — E785 Hyperlipidemia, unspecified: Secondary | ICD-10-CM | POA: Diagnosis present

## 2019-02-27 DIAGNOSIS — R140 Abdominal distension (gaseous): Secondary | ICD-10-CM | POA: Diagnosis not present

## 2019-02-27 DIAGNOSIS — Z981 Arthrodesis status: Secondary | ICD-10-CM | POA: Diagnosis not present

## 2019-02-27 DIAGNOSIS — Z7901 Long term (current) use of anticoagulants: Secondary | ICD-10-CM

## 2019-02-27 DIAGNOSIS — R509 Fever, unspecified: Secondary | ICD-10-CM

## 2019-02-27 DIAGNOSIS — R069 Unspecified abnormalities of breathing: Secondary | ICD-10-CM | POA: Diagnosis not present

## 2019-02-27 DIAGNOSIS — F329 Major depressive disorder, single episode, unspecified: Secondary | ICD-10-CM | POA: Diagnosis present

## 2019-02-27 DIAGNOSIS — E872 Acidosis, unspecified: Secondary | ICD-10-CM

## 2019-02-27 DIAGNOSIS — R7989 Other specified abnormal findings of blood chemistry: Secondary | ICD-10-CM | POA: Diagnosis present

## 2019-02-27 DIAGNOSIS — E8770 Fluid overload, unspecified: Secondary | ICD-10-CM

## 2019-02-27 DIAGNOSIS — I82409 Acute embolism and thrombosis of unspecified deep veins of unspecified lower extremity: Secondary | ICD-10-CM | POA: Diagnosis present

## 2019-02-27 LAB — BLOOD CULTURE ID PANEL (REFLEXED)
Acinetobacter baumannii: NOT DETECTED
Candida albicans: NOT DETECTED
Candida glabrata: NOT DETECTED
Candida krusei: NOT DETECTED
Candida parapsilosis: NOT DETECTED
Candida tropicalis: NOT DETECTED
ENTEROCOCCUS SPECIES: NOT DETECTED
Enterobacter cloacae complex: NOT DETECTED
Enterobacteriaceae species: NOT DETECTED
Escherichia coli: NOT DETECTED
Haemophilus influenzae: NOT DETECTED
Klebsiella oxytoca: NOT DETECTED
Klebsiella pneumoniae: NOT DETECTED
LISTERIA MONOCYTOGENES: NOT DETECTED
Methicillin resistance: NOT DETECTED
Neisseria meningitidis: NOT DETECTED
Proteus species: NOT DETECTED
Pseudomonas aeruginosa: NOT DETECTED
STREPTOCOCCUS SPECIES: NOT DETECTED
Serratia marcescens: NOT DETECTED
Staphylococcus aureus (BCID): NOT DETECTED
Staphylococcus species: DETECTED — AB
Streptococcus agalactiae: NOT DETECTED
Streptococcus pneumoniae: NOT DETECTED
Streptococcus pyogenes: NOT DETECTED

## 2019-02-27 LAB — LACTIC ACID, PLASMA
LACTIC ACID, VENOUS: 4.5 mmol/L — AB (ref 0.5–1.9)
Lactic Acid, Venous: 3.5 mmol/L (ref 0.5–1.9)
Lactic Acid, Venous: 4.2 mmol/L (ref 0.5–1.9)
Lactic Acid, Venous: 5.4 mmol/L (ref 0.5–1.9)
Lactic Acid, Venous: 4.1 mmol/L (ref 0.5–1.9)
Lactic Acid, Venous: 3.1 mmol/L (ref 0.5–1.9)

## 2019-02-27 LAB — COMPREHENSIVE METABOLIC PANEL
ALT: 61 U/L — ABNORMAL HIGH (ref 0–44)
AST: 87 U/L — ABNORMAL HIGH (ref 15–41)
Albumin: 4.3 g/dL (ref 3.5–5.0)
Alkaline Phosphatase: 58 U/L (ref 38–126)
Anion gap: 11 (ref 5–15)
BUN: 18 mg/dL (ref 8–23)
CHLORIDE: 104 mmol/L (ref 98–111)
CO2: 24 mmol/L (ref 22–32)
Calcium: 9.2 mg/dL (ref 8.9–10.3)
Creatinine, Ser: 1.46 mg/dL — ABNORMAL HIGH (ref 0.61–1.24)
GFR calc Af Amer: 50 mL/min — ABNORMAL LOW (ref >60)
GFR calc non Af Amer: 44 mL/min — ABNORMAL LOW (ref >60)
Glucose, Bld: 103 mg/dL — ABNORMAL HIGH (ref 70–99)
Potassium: 4.5 mmol/L (ref 3.5–5.1)
Sodium: 139 mmol/L (ref 135–145)
Total Bilirubin: 1.2 mg/dL (ref 0.3–1.2)
Total Protein: 7.4 g/dL (ref 6.5–8.1)

## 2019-02-27 LAB — RESPIRATORY PANEL BY PCR

## 2019-02-27 LAB — CBC WITH DIFFERENTIAL/PLATELET
Abs Immature Granulocytes: 0.05 10*3/uL (ref 0.00–0.07)
BASOS ABS: 0.1 10*3/uL (ref 0.0–0.1)
BASOS PCT: 1 %
Eosinophils Absolute: 0.1 10*3/uL (ref 0.0–0.5)
Eosinophils Relative: 1 %
HCT: 47.4 % (ref 39.0–52.0)
Hemoglobin: 15.2 g/dL (ref 13.0–17.0)
Immature Granulocytes: 0 %
Lymphocytes Relative: 38 %
Lymphs Abs: 5 10*3/uL — ABNORMAL HIGH (ref 0.7–4.0)
MCH: 32.1 pg (ref 26.0–34.0)
MCHC: 32.1 g/dL (ref 30.0–36.0)
MCV: 100 fL (ref 80.0–100.0)
Monocytes Absolute: 0.9 10*3/uL (ref 0.1–1.0)
Monocytes Relative: 7 %
Neutro Abs: 7 10*3/uL (ref 1.7–7.7)
Neutrophils Relative %: 53 %
Platelets: 204 10*3/uL (ref 150–400)
RBC: 4.74 MIL/uL (ref 4.22–5.81)
RDW: 12.5 % (ref 11.5–15.5)
WBC: 13 10*3/uL — ABNORMAL HIGH (ref 4.0–10.5)
nRBC: 0 % (ref 0.0–0.2)

## 2019-02-27 LAB — PROCALCITONIN: Procalcitonin: 0.1 ng/mL

## 2019-02-27 LAB — INFLUENZA PANEL BY PCR (TYPE A & B)
Influenza A By PCR: NEGATIVE
Influenza B By PCR: NEGATIVE

## 2019-02-27 LAB — HIV ANTIBODY (ROUTINE TESTING W REFLEX): HIV Screen 4th Generation wRfx: NONREACTIVE

## 2019-02-27 LAB — BRAIN NATRIURETIC PEPTIDE: B Natriuretic Peptide: 71.3 pg/mL (ref 0.0–100.0)

## 2019-02-27 MED ORDER — SODIUM CHLORIDE 0.9 % IV SOLN
500.0000 mg | INTRAVENOUS | Status: DC
  Administered 2019-02-28: 500 mg via INTRAVENOUS
  Filled 2019-02-27: qty 500

## 2019-02-27 MED ORDER — SODIUM CHLORIDE 0.9 % IV SOLN
1.0000 g | Freq: Once | INTRAVENOUS | Status: AC
  Administered 2019-02-27: 1 g via INTRAVENOUS
  Filled 2019-02-27: qty 10

## 2019-02-27 MED ORDER — FLUTICASONE FUROATE-VILANTEROL 100-25 MCG/INH IN AEPB
1.0000 | INHALATION_SPRAY | Freq: Every day | RESPIRATORY_TRACT | Status: DC
  Administered 2019-02-27 – 2019-03-02 (×4): 1 via RESPIRATORY_TRACT
  Filled 2019-02-27: qty 28

## 2019-02-27 MED ORDER — LORATADINE 10 MG PO TABS
10.0000 mg | ORAL_TABLET | Freq: Every day | ORAL | Status: DC
  Administered 2019-02-27 – 2019-03-02 (×4): 10 mg via ORAL
  Filled 2019-02-27 (×4): qty 1

## 2019-02-27 MED ORDER — HYDROXYZINE HCL 10 MG PO TABS
10.0000 mg | ORAL_TABLET | Freq: Three times a day (TID) | ORAL | Status: DC | PRN
  Administered 2019-03-01: 10 mg via ORAL
  Filled 2019-02-27 (×2): qty 1

## 2019-02-27 MED ORDER — IPRATROPIUM BROMIDE 0.02 % IN SOLN: 0.5000 mg | RESPIRATORY_TRACT | Status: DC

## 2019-02-27 MED ORDER — SODIUM CHLORIDE 0.9 % IV BOLUS
2000.0000 mL | Freq: Once | INTRAVENOUS | Status: AC
  Administered 2019-02-27: 2000 mL via INTRAVENOUS

## 2019-02-27 MED ORDER — IPRATROPIUM-ALBUTEROL 0.5-2.5 (3) MG/3ML IN SOLN
3.0000 mL | Freq: Four times a day (QID) | RESPIRATORY_TRACT | Status: DC | PRN
  Administered 2019-02-28: 3 mL via RESPIRATORY_TRACT
  Filled 2019-02-27 (×2): qty 3

## 2019-02-27 MED ORDER — SODIUM CHLORIDE 0.9 % IV SOLN
500.0000 mg | Freq: Once | INTRAVENOUS | Status: AC
  Administered 2019-02-27: 500 mg via INTRAVENOUS
  Filled 2019-02-27: qty 500

## 2019-02-27 MED ORDER — SODIUM CHLORIDE 0.9 % IV BOLUS: 1000.0000 mL | Freq: Once | INTRAVENOUS | Status: DC

## 2019-02-27 MED ORDER — GABAPENTIN 300 MG PO CAPS
300.0000 mg | ORAL_CAPSULE | Freq: Two times a day (BID) | ORAL | Status: DC
  Administered 2019-02-27 – 2019-03-02 (×7): 300 mg via ORAL
  Filled 2019-02-27 (×7): qty 1

## 2019-02-27 MED ORDER — SODIUM CHLORIDE 0.9 % IV SOLN
1.0000 g | INTRAVENOUS | Status: DC
  Administered 2019-02-28: 1 g via INTRAVENOUS
  Filled 2019-02-27: qty 10

## 2019-02-27 MED ORDER — LEVALBUTEROL TARTRATE 45 MCG/ACT IN AERO: 2.0000 | INHALATION_SPRAY | Freq: Four times a day (QID) | RESPIRATORY_TRACT | Status: DC | PRN

## 2019-02-27 MED ORDER — HYDRALAZINE HCL 20 MG/ML IJ SOLN
5.0000 mg | INTRAMUSCULAR | Status: DC | PRN
  Filled 2019-02-27: qty 1

## 2019-02-27 MED ORDER — VITAMIN D 25 MCG (1000 UNIT) PO TABS
1000.0000 [IU] | ORAL_TABLET | Freq: Every day | ORAL | Status: DC
  Administered 2019-02-27 – 2019-03-02 (×4): 1000 [IU] via ORAL
  Filled 2019-02-27 (×4): qty 1

## 2019-02-27 MED ORDER — PANTOPRAZOLE SODIUM 40 MG PO TBEC
40.0000 mg | DELAYED_RELEASE_TABLET | Freq: Every day | ORAL | Status: DC
  Administered 2019-02-27 – 2019-03-01 (×3): 40 mg via ORAL
  Filled 2019-02-27 (×3): qty 1

## 2019-02-27 MED ORDER — AMIODARONE HCL 200 MG PO TABS
200.0000 mg | ORAL_TABLET | Freq: Every day | ORAL | Status: DC
  Administered 2019-02-27 – 2019-03-02 (×4): 200 mg via ORAL
  Filled 2019-02-27 (×4): qty 1

## 2019-02-27 MED ORDER — IPRATROPIUM BROMIDE HFA 17 MCG/ACT IN AERS: 2.0000 | INHALATION_SPRAY | RESPIRATORY_TRACT | Status: DC

## 2019-02-27 MED ORDER — DM-GUAIFENESIN ER 30-600 MG PO TB12
1.0000 | ORAL_TABLET | Freq: Two times a day (BID) | ORAL | Status: DC | PRN
  Administered 2019-02-27 – 2019-02-28 (×2): 1 via ORAL
  Filled 2019-02-27 (×2): qty 1

## 2019-02-27 MED ORDER — TRAMADOL HCL 50 MG PO TABS
50.0000 mg | ORAL_TABLET | Freq: Four times a day (QID) | ORAL | Status: DC | PRN
  Administered 2019-02-28 – 2019-03-01 (×2): 50 mg via ORAL
  Filled 2019-02-27 (×2): qty 1

## 2019-02-27 MED ORDER — METOPROLOL TARTRATE 25 MG PO TABS
25.0000 mg | ORAL_TABLET | Freq: Two times a day (BID) | ORAL | Status: DC
  Administered 2019-02-27 – 2019-03-02 (×7): 25 mg via ORAL
  Filled 2019-02-27 (×7): qty 1

## 2019-02-27 MED ORDER — SODIUM CHLORIDE 0.9 % IV SOLN
INTRAVENOUS | Status: DC
  Administered 2019-02-27 – 2019-02-28 (×4): via INTRAVENOUS

## 2019-02-27 MED ORDER — LEVALBUTEROL HCL 1.25 MG/0.5ML IN NEBU
1.2500 mg | INHALATION_SOLUTION | Freq: Four times a day (QID) | RESPIRATORY_TRACT | Status: DC
  Administered 2019-02-27: 1.25 mg via RESPIRATORY_TRACT
  Filled 2019-02-27: qty 0.5

## 2019-02-27 MED ORDER — PRAVASTATIN SODIUM 40 MG PO TABS
80.0000 mg | ORAL_TABLET | Freq: Every day | ORAL | Status: DC
  Administered 2019-02-27 – 2019-03-01 (×3): 80 mg via ORAL
  Filled 2019-02-27 (×3): qty 2

## 2019-02-27 MED ORDER — SODIUM CHLORIDE 0.9 % IV BOLUS
500.0000 mL | Freq: Once | INTRAVENOUS | Status: AC
  Administered 2019-02-27: 500 mL via INTRAVENOUS

## 2019-02-27 MED ORDER — ACETAMINOPHEN 325 MG PO TABS
650.0000 mg | ORAL_TABLET | Freq: Once | ORAL | Status: AC
  Administered 2019-02-27: 650 mg via ORAL
  Filled 2019-02-27: qty 2

## 2019-02-27 MED ORDER — APIXABAN 5 MG PO TABS
5.0000 mg | ORAL_TABLET | Freq: Two times a day (BID) | ORAL | Status: DC
  Administered 2019-02-27 – 2019-03-02 (×7): 5 mg via ORAL
  Filled 2019-02-27 (×7): qty 1

## 2019-02-27 MED ORDER — IPRATROPIUM BROMIDE 0.02 % IN SOLN
0.5000 mg | Freq: Four times a day (QID) | RESPIRATORY_TRACT | Status: DC
  Administered 2019-02-27: 0.5 mg via RESPIRATORY_TRACT
  Filled 2019-02-27: qty 2.5

## 2019-02-27 NOTE — ED Provider Notes (Signed)
[facility] EMERGENCY DEPARTMENT Provider Note   CSN: [id number] Arrival date & time: 02/27/19  0115    History   Chief Complaint Chief Complaint  Patient presents with  . Shortness of Breath    HPI Eric [name] is a 85 y.o. male.     Patient with history of CHF, HTN, DVT on Eliquis, pneumonia, ischemic colitis presents with 12 hours of URI symptoms including sneezing, cough, progressive weakness and SOB. He was found to be febrile at home by a family member this evening, prompting emergency department evaluation. Per EMS, he was given solumedrol, albuterol nebulizer in route due to wheezing and c/o SOB. The patient reports current symptoms are the same as admission in November of last year. Per chart review, this admission was for syncope during which he was diagnosed with paroxysmal atrial fibrillation, discharged home on nocturnal oxygen. He reports constant, nonradiating chest/epigastric pain tonight that is worse with cough. No abdominal pain, nausea, vomiting. He denies known exposure to COVID-19 or any sick contacts. No recent travel. He lives at home with his family in a private residence. He has a brief, very remote smoking history.   The history is provided by the patient. No language interpreter was used.  Shortness of Breath  Associated symptoms: chest pain (See HPI.), cough and fever   Associated symptoms: no headaches and no vomiting     Past Medical History:  Diagnosis Date  . Anxiety   . Arthritis   . Atypical chest pain    a. 05/2017 MV: no ischemia, EF 79%.  . Chest pain 10/20/2017  . Chronic diastolic CHF (congestive heart failure) ([place])    a. 01/2011 Echo: EF 50-55%, gr1 DD, mild AI, nl RV fxn, mild TR/PR; b. 10/2017 Echo: EF 60-65%, mild LVH, gr2 DD.  Marland Kitchen DDD (degenerative disc disease), cervical   . Depressive disorder, not elsewhere classified   . Difficult intubation   . Dysphagia, oral phase   . Dyspnea   . Edema   . Gallstones    a.  Symptomatic - s/p lap chole 05/2018.  Marland Kitchen History of DVT (deep vein thrombosis)   . History of kidney stones   . Hypertension   . Hypoxemia   . Impacted cerumen   . Ischemic colitis ([place])    a. 02/2018 GIB - colonoscopy w/ isch colitis. Anticoagulation resumed.  . Long term current use of anticoagulant 03/02/2011  . LOW BACK PAIN SYNDROME 03/17/2009   Qualifier: Diagnosis of  By: Council Mechanic MD, Hilaria Ota   . Mixed hyperlipidemia   . 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 Other myelopathy   . Pain in limb   . Palpitations   . Paroxysmal Atrial Fibrillation ([place])    a. a. 01/2011 in setting of post-op complications including aspiration pna;  b. CHA2DS2VASc = 4--> Amio/Eliquis.  . Pneumonia 03/06/2003  . Spinal stenosis, unspecified region other than cervical   . 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 Syncope    a. 10/2017-->Event monitor: RSR, rare PACs/PVCs.  . Thoracic or lumbosacral neuritis or radiculitis, unspecified     Patient Active Problem List   Diagnosis Date Noted  . Gait abnormality 01/21/2019  . Paresthesia 01/21/2019  . S/P laparoscopic cholecystectomy 05/24/2018  . Blood in stool   . Ischemic bowel disease ([place])   . Lower GI bleed   . BRBPR (bright red blood per  rectum) 02/18/2018  . Calculus of gallbladder without cholecystitis without obstruction 02/01/2018  . Right upper quadrant abdominal pain 02/01/2018  . Chest pain 10/20/2017  . Syncope and collapse 10/20/2017  . S/P ORIF (open reduction internal fixation) fracture 03/07/2017  . Lower extremity edema 03/07/2017  . Chronic diastolic CHF (congestive heart failure) ([place]) 01/19/2015  . Cervical myelopathy with cervical radiculopathy 06/28/2013  . Cataract of both eyes 06/28/2013  . Anemia of chronic disease 04/14/2013  . Spondylosis, cervical, with myelopathy   . Spinal stenosis, lumbar region, with neurogenic claudication   . DDD  (degenerative disc disease), cervical   . PAF (paroxysmal atrial fibrillation) ([place])   . Hypertension   . Dizziness 11/09/2011  . Edema 05/18/2011  . Long term current use of anticoagulant 03/02/2011  . VITAMIN D DEFICIENCY 11/29/2010  . Anxiety 07/16/2009  . LOW BACK PAIN SYNDROME 03/17/2009  . NONTRAUMATIC RUPTURE OF TENDONS OF BICEPS 12/01/2008  . ERECTILE DYSFUNCTION, ORGANIC 12/10/2007  . Hyperlipidemia 05/28/2007  . SEXUAL DYSFUNCTION 05/28/2007  . FATTY LIVER DISEASE- ABD. U/S 11/01, MILD ELEVATED LFT'S 05/28/2007    Past Surgical History:  Procedure Laterality Date  . CARDIOVERSION N/A 06/20/2017   Procedure: CARDIOVERSION;  Surgeon: [name], MD;  Location: [facility] ORS;  Service: Cardiovascular;  Laterality: N/A;  . CATARACT EXTRACTION W/ INTRAOCULAR LENS  IMPLANT, BILATERAL    . CERVICAL FUSION  02/10/2011  . CHOLECYSTECTOMY  05/24/2018  . CHOLECYSTECTOMY N/A 05/24/2018   Procedure: LAPAROSCOPIC CHOLECYSTECTOMY;  Surgeon: Coralie Keens, MD;  Location: [facility];  Service: General;  Laterality: N/A;  . COLONOSCOPY WITH PROPOFOL N/A 02/20/2018   Procedure: COLONOSCOPY WITH PROPOFOL;  Surgeon: Lucilla Lame, MD;  Location: [facility] ENDOSCOPY;  Service: Endoscopy;  Laterality: N/A;  . history of abd ultrasound  11/01   fatty liver  . MULTIPLE TOOTH EXTRACTIONS    . ORIF FIBULA FRACTURE Left 01/06/2017   Procedure: OPEN REDUCTION INTERNAL FIXATION (ORIF) FIBULA FRACTURE DISTAL FIBULA;  Surgeon: Melrose Nakayama, MD;  Location: [facility];  Service: Orthopedics;  Laterality: Left;  Patient states has problems if he will have a tube in throat for Genera; Anesthesia        Home Medications    Prior to Admission medications   Medication Sig Start Date End Date Taking? Authorizing Provider  amiodarone (PACERONE) 200 MG tablet TAKE 1 TABLET BY MOUTH EVERY DAY 02/21/19   [name], MD  apixaban (ELIQUIS) 5 MG TABS tablet Take 1 tablet (5 mg total) by mouth 2 (two) times daily.  04/24/17   [name], MD  cetirizine (ZYRTEC) 10 MG tablet TAKE 1 TABLET BY MOUTH EVERY DAY 02/11/19   Reed, [name] L, DO  Cholecalciferol (VITAMIN D) 1000 UNITS capsule Take one tablet once daily 08/09/13   Reed, [name] L, DO  fluticasone (FLONASE) 50 MCG/ACT nasal spray Place 2 sprays daily into both nostrils. 10/27/17 11/20/18  Lauree Chandler, NP  fluticasone furoate-vilanterol (BREO ELLIPTA) 100-25 MCG/INH AEPB Inhale 1 puff into the lungs daily.    [provider]  furosemide (LASIX) 40 MG tablet Take 1 tablet (40 mg total) by mouth daily. 11/20/18   [name], MD  gabapentin (NEURONTIN) 300 MG capsule Take 1 capsule (300 mg total) by mouth 2 (two) times daily. 01/11/18   Reed, [name] L, DO  metoprolol tartrate (LOPRESSOR) 25 MG tablet Take 1 tablet (25 mg total) by mouth 2 (two) times daily. 01/07/19 01/07/20  [name], MD  pantoprazole (PROTONIX) 40 MG  tablet Take 1 tablet (40 mg total) by mouth at bedtime. 01/07/19   [name], MD  potassium chloride SA (K-DUR,KLOR-CON) 10 MEQ tablet Take 1 tablet (10 mEq total) by mouth daily. 11/20/18   [name], MD  pravastatin (PRAVACHOL) 80 MG tablet Take 1 tablet (80 mg total) by mouth daily. 01/21/19   Reed, [name] L, DO  traMADol (ULTRAM) 50 MG tablet Take 1 tablet (50 mg total) by mouth every 6 (six) hours as needed. 05/25/18   Coralie Keens, MD    Family History Family History  Problem Relation Age of Onset  . Stroke Father   . Atrial fibrillation Brother        on coumadin  . Heart disease Brother        AFib- coumadin  . Stroke Paternal Grandfather   . Other Mother        hemorrhage  . Cancer Other        colon cancer at early age  . Prostate cancer Neg Hx   . Kidney cancer Neg Hx   . Bladder Cancer Neg Hx     Social History Social History   Tobacco Use  . Smoking status: Former Smoker    Years: 1.00  . Smokeless tobacco: Never Used  . Tobacco comment: stopped in 20's   Substance Use Topics  . Alcohol use: No  . Drug use: No     Allergies   Morphine and related; Percocet [oxycodone-acetaminophen]; and Valium   Review of Systems Review of Systems  Constitutional: Positive for fever.  HENT: Positive for rhinorrhea and sneezing.   Respiratory: Positive for cough and shortness of breath.   Cardiovascular: Positive for chest pain (See HPI.).  Gastrointestinal: Negative.  Negative for nausea and vomiting.  Musculoskeletal: Negative.  Negative for myalgias.  Skin: Negative.   Neurological: Positive for weakness. Negative for headaches.     Physical Exam Updated Vital Signs BP 137/72   Pulse 96   Temp (!) 100.6 F (38.1 C) (Oral)   Resp (!) 21   Ht 5\' 6"  (1.676 m)   Wt 86.2 kg   SpO2 100%   BMI 30.67 kg/m   Physical Exam Vitals signs and nursing note reviewed.  Constitutional:      General: He is not in acute distress.    Appearance: He is well-developed. He is not ill-appearing.  HENT:     Head: Normocephalic.     Mouth/Throat:     Mouth: Mucous membranes are moist.  Neck:     Musculoskeletal: Normal range of motion and neck supple.  Cardiovascular:     Rate and Rhythm: Normal rate and regular rhythm.     Heart sounds: No murmur.  Pulmonary:     Effort: Pulmonary effort is normal. Tachypnea present.     Breath sounds: Examination of the right-lower field reveals rales. Examination of the left-lower field reveals rales. Rales present. No wheezing.     Comments: Examined after nebulizer treatment in route.  Chest:     Chest wall: Tenderness (Across chest wall) present.  Abdominal:     General: Bowel sounds are normal.     Palpations: Abdomen is soft.     Tenderness: There is no abdominal tenderness. There is no guarding or rebound.  Musculoskeletal: Normal range of motion.     Right lower leg: Edema present.     Left lower leg: Edema present.  Skin:    General: Skin is warm and dry.  Findings: No rash.  Neurological:      Mental Status: He is alert and oriented to person, place, and time.      ED Treatments / Results  Labs (all labs ordered are listed, but only abnormal results are displayed) Labs Reviewed - No data to display  EKG EKG Interpretation  Date/Time:  Wednesday February 27 2019 01:22:43 EDT Ventricular Rate:  107 PR Interval:    QRS Duration: 94 QT Interval:  382 QTC Calculation: 510 R Axis:   -42 Text Interpretation:  Sinus or ectopic atrial tachycardia Left axis deviation Abnormal R-wave progression, late transition Prolonged QT interval Confirmed by Pryor Curia [phone]) on 02/27/2019 1:29:10 AM   Radiology No results found.  Procedures Procedures (including critical care time)  Medications Ordered in ED Medications - No data to display   Initial Impression / Assessment and Plan / ED Course  I have reviewed the triage vital signs and the nursing notes.  Pertinent labs & imaging results that were available during my care of the patient were reviewed by me and considered in my medical decision making (see chart for details).        Patient from home for evaluation of 12 hours of cough, sneezing, fever, wheezing and SOB. He has no known exposures to COVID-19, no travel. He has an extensive medical history including PNA, CHF, nocturnal O2 requirement, DVT, HTN, atrial fibrillation.   The patient is nontoxic in appearance. He was given a nebulizer treatment in route by EMS and reports this helped his breathing significantly. No wheezing on auscultation, however, there are bilateral LL rales and mild peripheral edema. Consider CHF exacerbation. He has been febrile and is febrile on arrival. After nebulizer treatment, his O2 saturation is low at around 90% requiring 3L O2 to maintain. Consider CAP.   Lactic acid is elevated at 3.5. Antibiotics for CAP started for clinical picture of pneumonia. CXR reported as clear. Cultures pending.   No known sick contacts and no recent travel.  Given his symptoms of SOB, cough, fever, significant comorbidities in advanced age patient, feel COVID-19 testing [place] be warranted. Admitting team to decide.   Discussed with Dr. Blaine Hamper, [facility], who accepts the patient for admission.   Final Clinical Impressions(s) / ED Diagnoses   Final diagnoses:  None   1. Febrile illness 2. SOB 3. Cough 4. Lactic acidosis  ED Discharge Orders    None       Charlann Lange, PA-C 02/27/19 [place], Delice Bison, DO 02/27/19 [phone]

## 2019-02-27 NOTE — ED Notes (Signed)
Lab contacted about lactic acid that was drawn at [place], Olivia Mackie in lab advised there was an issue with the analyzer and they are working to get results processed at this time.

## 2019-02-27 NOTE — Progress Notes (Signed)
CRITICAL VALUE ALERT  Critical Value: Lactic Acid 3.1  Date & Time Notied:  02-27-2019 824p  Provider Notified:Y  Orders Received/Actions taken: Na

## 2019-02-27 NOTE — H&P (Addendum)
History and Physical    Eric [name] MRN:[id number] DOB: [birth year] DOA: 02/27/2019  Referring MD/NP/PA:   PCP: [name], DO   Patient coming from:  The patient is coming from home.  At baseline, pt is independent for most of ADL.        Chief Complaint: Fever, chills, cough, shortness of breath  HPI: Eric [name] is a 85 y.o. male with medical history significant of hypertension, hyperlipidemia, GERD, depression, anxiety, sCHF, DVT, A. fib on Eliquis, CKD-3, who presents with fever, chills, cough and shortness of breath.  Patient states that he has been having shortness breath in the past several days, which has worsened tonight.  He also has dry cough, fever and chils and sneezing.  Patient also reports lower chest pain, which is intermittent, moderate, sharp, pleuritic, aggravated by deep breaths.  He states that he has chronic mild intermittent diarrhea, which has not changed.  Currently no nausea vomiting, abdominal pain.  Denies symptoms of UTI or unilateral weakness.  Patient denies sick contact.  No recent traveling.  Patient states that he eat outside in restaurants on Sunday. Per EDP, EMS reported that pt had wheezing earlier.  ED Course: pt was found to have WBC 13.0, lactic acid 3.5, BNP 71.3, pending urinalysis, negative flu PCR, RVP positive for rhinovirus/enterovirus, renal function close to baseline, abnormal liver function (ALP 58, AST 87, ALT 61, total bilirubin 1.2), temperature 100.6, tachycardia, tachypnea, oxygen saturation 90% on room air, negative chest x-ray.  Pending RVP.  Patient is admitted to stepdown as inpatient.  Review of Systems:   General: has fevers, chills, no body weight gain, has fatigue HEENT: no blurry vision, hearing changes or sore throat Respiratory: has dyspnea, coughing, wheezing CV: no chest pain, no palpitations GI: no nausea, vomiting, abdominal pain, has mild chronic diarrhea, no constipation GU: no dysuria, burning on urination, increased  urinary frequency, hematuria  Ext: has leg edema Neuro: no unilateral weakness, numbness, or tingling, no vision change or hearing loss Skin: no rash, no skin tear. MSK: No muscle spasm, no deformity, no limitation of range of movement in spin Heme: No easy bruising.  Travel history: No recent long distant travel.  Allergy:  Allergies  Allergen Reactions   Morphine And Related Shortness Of Breath   Percocet [Oxycodone-Acetaminophen] Shortness Of Breath   Valium Shortness Of Breath    Past Medical History:  Diagnosis Date   Anxiety    Arthritis    Atypical chest pain    a. 05/2017 MV: no ischemia, EF 79%.   Chest pain 10/20/2017   Chronic diastolic CHF (congestive heart failure) ([place])    a. 01/2011 Echo: EF 50-55%, gr1 DD, mild AI, nl RV fxn, mild TR/PR; b. 10/2017 Echo: EF 60-65%, mild LVH, gr2 DD.   DDD (degenerative disc disease), cervical    Depressive disorder, not elsewhere classified    Difficult intubation    Dysphagia, oral phase    Dyspnea    Edema    Gallstones    a. Symptomatic - s/p lap chole 05/2018.   History of DVT (deep vein thrombosis)    History of kidney stones    Hypertension    Hypoxemia    Impacted cerumen    Ischemic colitis ([place])    a. 02/2018 GIB - colonoscopy w/ isch colitis. Anticoagulation resumed.   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 a. 01/2011 in setting of post-op complications including aspiration pna;  b. CHA2DS2VASc = 4--> Amio/Eliquis.   Pneumonia 03/06/2003   Spinal stenosis, unspecified region other than cervical    Squamous cell carcinoma of skin of trunk, except scrotum    skin cancer of shoulder   Syncope    a.  10/2017-->Event monitor: RSR, rare PACs/PVCs.   Thoracic or lumbosacral neuritis or radiculitis, unspecified     Past Surgical History:  Procedure Laterality Date   CARDIOVERSION N/A 06/20/2017   Procedure: CARDIOVERSION;  Surgeon: [name], MD;  Location: [facility];  Service: Cardiovascular;  Laterality: N/A;   CATARACT EXTRACTION W/ INTRAOCULAR LENS  IMPLANT, BILATERAL     CERVICAL FUSION  02/10/2011   CHOLECYSTECTOMY  05/24/2018   CHOLECYSTECTOMY N/A 05/24/2018   Procedure: LAPAROSCOPIC CHOLECYSTECTOMY;  Surgeon: Coralie Keens, MD;  Location: [place];  Service: General;  Laterality: N/A;   COLONOSCOPY WITH PROPOFOL N/A 02/20/2018   Procedure: COLONOSCOPY WITH PROPOFOL;  Surgeon: Lucilla Lame, MD;  Location: [facility];  Service: Endoscopy;  Laterality: N/A;   history of abd ultrasound  11/01   fatty liver   MULTIPLE TOOTH EXTRACTIONS     ORIF FIBULA FRACTURE Left 01/06/2017   Procedure: OPEN REDUCTION INTERNAL FIXATION (ORIF) FIBULA FRACTURE DISTAL FIBULA;  Surgeon: Melrose Nakayama, MD;  Location: [place];  Service: Orthopedics;  Laterality: Left;  Patient states has problems if he will have a tube in throat for Genera; Anesthesia    Social History:  reports that he has quit smoking. He quit after 1.00 year of use. He has never used smokeless tobacco. He reports that he does not drink alcohol or use drugs.  Family History:  Family History  Problem Relation Age of Onset   Stroke Father    Atrial fibrillation Brother        on coumadin   Heart disease Brother        AFib- coumadin   Stroke Paternal Grandfather    Other Mother        hemorrhage   Cancer Other        colon cancer at early age   Prostate cancer Neg Hx    Kidney cancer Neg Hx    Bladder Cancer Neg Hx      Prior to Admission medications   Medication Sig Start Date End Date Taking? Authorizing Provider  amiodarone (PACERONE) 200 MG tablet TAKE 1 TABLET BY MOUTH EVERY DAY 02/21/19    [name], MD  apixaban (ELIQUIS) 5 MG TABS tablet Take 1 tablet (5 mg total) by mouth 2 (two) times daily. 04/24/17   [name], MD  cetirizine (ZYRTEC) 10 MG tablet TAKE 1 TABLET BY MOUTH EVERY DAY 02/11/19   Reed, [name] L, DO  Cholecalciferol (VITAMIN D) 1000 UNITS capsule Take one tablet once daily 08/09/13   Reed, [name] L, DO  fluticasone (FLONASE) 50 MCG/ACT nasal spray Place 2 sprays daily into both nostrils. 10/27/17 11/20/18  Lauree Chandler, NP  fluticasone furoate-vilanterol (BREO ELLIPTA) 100-25 MCG/INH AEPB Inhale 1 puff into the lungs daily.    [provider]  furosemide (LASIX) 40 MG tablet Take 1 tablet (40 mg total) by mouth daily. 11/20/18   [name], MD  gabapentin (NEURONTIN)  300 MG capsule Take 1 capsule (300 mg total) by mouth 2 (two) times daily. 01/11/18   Reed, [name] L, DO  metoprolol tartrate (LOPRESSOR) 25 MG tablet Take 1 tablet (25 mg total) by mouth 2 (two) times daily. 01/07/19 01/07/20  [name], MD  pantoprazole (PROTONIX) 40 MG tablet Take 1 tablet (40 mg total) by mouth at bedtime. 01/07/19   [name], MD  potassium chloride SA (K-DUR,KLOR-CON) 10 MEQ tablet Take 1 tablet (10 mEq total) by mouth daily. 11/20/18   [name], MD  pravastatin (PRAVACHOL) 80 MG tablet Take 1 tablet (80 mg total) by mouth daily. 01/21/19   Reed, [name] L, DO  traMADol (ULTRAM) 50 MG tablet Take 1 tablet (50 mg total) by mouth every 6 (six) hours as needed. 05/25/18   Coralie Keens, MD    Physical Exam: Vitals:   02/27/19 0415 02/27/19 0430 02/27/19 0500 02/27/19 0530  BP:  117/70 114/67 107/64  Pulse: 91 90 90 74  Resp: 17 18 18 16   Temp:      TempSrc:      SpO2: 95% 96% 93% 93%  Weight:      Height:       General: Not in acute distress HEENT:       Eyes: PERRL, EOMI, no scleral icterus.       ENT: No discharge from the ears and nose, no pharynx injection, no tonsillar enlargement.        Neck: No JVD, no  bruit, no mass felt. Heme: No neck lymph node enlargement. Cardiac: S1/S2, RRR, No murmurs, No gallops or rubs. Respiratory: has rhonchi and crackles bilaterally. GI: Soft, nondistended, nontender, no rebound pain, no organomegaly, BS present. GU: No hematuria Ext: has 1+ pitting leg edema bilaterally. 2+DP/PT pulse bilaterally. Musculoskeletal: No joint deformities, No joint redness or warmth, no limitation of ROM in spin. Skin: No rashes.  Neuro: Alert, oriented X3, cranial nerves II-XII grossly intact, moves all extremities normally.  Psych: Patient is not psychotic, no suicidal or hemocidal ideation.  Labs on Admission: I have personally reviewed following labs and imaging studies  CBC: Recent Labs  Lab 02/27/19 0145  WBC 13.0*  NEUTROABS 7.0  HGB 15.2  HCT 47.4  MCV 100.0  PLT 330   Basic Metabolic Panel: Recent Labs  Lab 02/27/19 0145  NA 139  K 4.5  CL 104  CO2 24  GLUCOSE 103*  BUN 18  CREATININE 1.46*  CALCIUM 9.2   GFR: Estimated Creatinine Clearance: 38.8 mL/min (A) (by C-G formula based on SCr of 1.46 mg/dL (H)). Liver Function Tests: Recent Labs  Lab 02/27/19 0145  AST 87*  ALT 61*  ALKPHOS 58  BILITOT 1.2  PROT 7.4  ALBUMIN 4.3   No results for input(s): LIPASE, AMYLASE in the last 168 hours. No results for input(s): AMMONIA in the last 168 hours. Coagulation Profile: No results for input(s): INR, PROTIME in the last 168 hours. Cardiac Enzymes: No results for input(s): CKTOTAL, CKMB, CKMBINDEX, TROPONINI in the last 168 hours. BNP (last 3 results) No results for input(s): PROBNP in the last 8760 hours. HbA1C: No results for input(s): HGBA1C in the last 72 hours. CBG: No results for input(s): GLUCAP in the last 168 hours. Lipid Profile: No results for input(s): CHOL, HDL, LDLCALC, TRIG, CHOLHDL, LDLDIRECT in the last 72 hours. Thyroid Function Tests: No results for input(s): TSH, T4TOTAL, FREET4, T3FREE, THYROIDAB in the last 72  hours. Anemia Panel: No results for input(s): VITAMINB12,  FOLATE, FERRITIN, TIBC, IRON, RETICCTPCT in the last 72 hours. Urine analysis:    Component Value Date/Time   COLORURINE YELLOW 02/02/2011 1025   APPEARANCEUR Clear 07/27/2017 0954   LABSPEC 1.021 02/02/2011 1025   PHURINE 6.5 02/02/2011 1025   GLUCOSEU Negative 07/27/2017 0954   HGBUR NEGATIVE 02/02/2011 1025   BILIRUBINUR Negative 07/27/2017 [place] 02/02/2011 1025   PROTEINUR Negative 07/27/2017 0954   PROTEINUR NEGATIVE 02/02/2011 1025   UROBILINOGEN 0.2 02/02/2011 1025   NITRITE Negative 07/27/2017 0954   NITRITE NEGATIVE 02/02/2011 1025   LEUKOCYTESUR Negative 07/27/2017 0954   Sepsis Labs: @LABRCNTIP (procalcitonin:4,lacticidven:4) ) Recent Results (from the past 240 hour(s))  Respiratory Panel by PCR     Status: Abnormal   Collection Time: 02/27/19  1:48 AM  Result Value Ref Range Status   Adenovirus NOT DETECTED NOT DETECTED Final   Coronavirus 229E NOT DETECTED NOT DETECTED Final    Comment: (NOTE) The Coronavirus on the Respiratory Panel, DOES NOT test for the novel  Coronavirus (2019 nCoV)    Coronavirus HKU1 NOT DETECTED NOT DETECTED Final   Coronavirus NL63 NOT DETECTED NOT DETECTED Final   Coronavirus OC43 NOT DETECTED NOT DETECTED Final   Metapneumovirus NOT DETECTED NOT DETECTED Final   Rhinovirus / Enterovirus DETECTED (A) NOT DETECTED Final   Influenza A NOT DETECTED NOT DETECTED Final   Influenza B NOT DETECTED NOT DETECTED Final   Parainfluenza Virus 1 NOT DETECTED NOT DETECTED Final   Parainfluenza Virus 2 NOT DETECTED NOT DETECTED Final   Parainfluenza Virus 3 NOT DETECTED NOT DETECTED Final   Parainfluenza Virus 4 NOT DETECTED NOT DETECTED Final   Respiratory Syncytial Virus NOT DETECTED NOT DETECTED Final   Bordetella pertussis NOT DETECTED NOT DETECTED Final   Chlamydophila pneumoniae NOT DETECTED NOT DETECTED Final   Mycoplasma pneumoniae NOT DETECTED NOT DETECTED  Final    Comment: Performed at [facility], [place]. [address], [place], [place] [address]     Radiological Exams on Admission: Dg Chest Portable 1 View  Result Date: 02/27/2019 CLINICAL DATA:  Cough with fever.  Short of breath. EXAM: PORTABLE CHEST 1 VIEW COMPARISON:  10/16/2018 FINDINGS: Cardiac silhouette borderline enlarged. No mediastinal or hilar masses or convincing adenopathy. Clear lungs.  No pleural effusion or pneumothorax. Stable changes from a previous posterior cervical spine fusion. Skeletal structures are demineralized but grossly intact. IMPRESSION: No active disease. Electronically Signed   By: Lajean Manes M.D.   On: 02/27/2019 02:29     EKG: Independently reviewed.  Sinus rhythm, QTC 510, tachycardia, poor R wave progression, LAD   Assessment/Plan Principal Problem:   Bronchitis Active Problems:   Hyperlipidemia   Long term current use of anticoagulant   PAF (paroxysmal atrial fibrillation) (HCC)   Hypertension   Chronic diastolic CHF (congestive heart failure) ([facility])   Acute on chronic respiratory failure with hypoxia (HCC)   DVT (deep venous thrombosis) ([facility])   CKD (chronic kidney disease), stage III ([facility])   Sepsis ([facility])   Abnormal LFTs  Sepsis and acute on chronic respiratory failure with hypoxia due to possible bronchitis due to rhinovirus: Patient meets criteria for sepsis with leukocytosis, fever, tachycardia and tachypnea.  Lactic acid 3.5.  Currently hemodynamically stable.  Chest x-ray negative.  Patient has remote history of smoking, making undiagnosed COPD a possible differential diagnosis.  Early stage of pneumonia is also possible.  Patient uses oxygen at night sometimes, presents with oxygen desaturation to 90% on room air today. Flu PCR  negative. RVP positive for rhinovirus. Pt has no sick contact or recent traveling.  The only risk that he has for COVID19 is that he eat outside in restaurants on Sunday. I have lower suspicion for COVID  19.   -will admit patient to telemetry bed  -Nebulizers:scheduled Atrovent and prn Xopenex  -continue home Breo Ellipta inhaler -Abx: Rocephin and azithromycin -Mucinex for cough  -Urine S. pneumococcal antigen -Follow up blood culture x2, sputum culture, pending respiratory virus panel -Nasal cannula oxygen as needed to maintain O2 saturation 93% or greater -will get Procalcitonin and trend lactic acid levels per sepsis protocol. -IVF: 2.5 L of NS bolus in ED, followed by 125 cc/h   Hyperlipidemia: -Pravastatin  DVT: -Eliquis  PAF: CHA2DS2-VASc Score is 4, needs oral anticoagulation. Patient is Eliquis at home. INR is  on admission. Heart rate is 110s -continue Eliquis  - on metoprolol and amiodarone  Hypertension: -continue metoprolol -Hold Lasix due to Sepsis -IV hydralazine as needed  Chronic diastolic CHF (congestive heart failure) ([place]): 2D echo on 10/20/2018 showed EF 60-65% with grade 2 diastolic dysfunction.  Patient has 1+ leg edema, but no pulmonary edema on chest x-ray.  CHF seem to be compensated. -Hold Lasix due to sepsis -Watch volume status closely  CKD (chronic kidney disease), stage III [facility]): Renal function close to baseline.  Baseline creatinine 1.2-1.4.  His creatinine is 1.46, BUN 18 today. -Follow-up renal function by BMP  Mildly abnormal LFTs: ALP 58, AST 87, ALT 61, total bilirubin 1.2.  Likely due to sepsis. -Check hepatitis panel.    Inpatient status:  # Patient requires inpatient status due to high intensity of service, high risk for further deterioration and high frequency of surveillance required.  I certify that at the point of admission it is my clinical judgment that the patient will require inpatient hospital care spanning beyond 2 midnights from the point of admission.   This patient has multiple chronic comorbidities including hypertension, hyperlipidemia, GERD, depression, anxiety, sCHF, DVT, A. fib on Eliquis, CKD-3.  Now patient  has presenting with acute on chronic respiratory failure, possible bronchitis versus early stage of pneumonia.  RVP tested positive for rhinovirus.  The worrisome physical exam findings include bilateral rhonchi and crackles on auscultation.  Patient also has leg edema.  The initial radiographic and laboratory data are worrisome because of positive rhinovirus test, sepsis, elevated lactic acid, abnormal liver function  Current medical needs: please see my assessment and plan  Predictability of an adverse outcome (risk): Patient has multiple comorbidities, now presents with acute on chronic respiratory failure due to acute bronchitis versus early stage of pneumonia.  Patient has sepsis.  Patient is at high risk for deteriorating.  Will need to be treated in hospital for at least 2 days.      DVT ppx: on Eliquis Code Status: Full code Family Communication: None at bed side.      Disposition Plan:  Anticipate discharge back to previous home environment Consults called:  none Admission status: Inpatient/tele  Date of Service 02/27/2019    [facility]   If 7PM-7AM, please contact night-coverage [email or web address] Password [facility] 02/27/2019, 5:40 AM

## 2019-02-27 NOTE — Progress Notes (Signed)
PHARMACY - PHYSICIAN COMMUNICATION CRITICAL VALUE ALERT - BLOOD CULTURE IDENTIFICATION (BCID)  Eric [name] is an 85 y.o. male who presented to [facility] on 02/27/2019 with a chief complaint of sepsis.  Assessment:  Pt growing strep species in 1/4 blood cultures, likely contaminant - PNA suspected.  Name of physician (or Provider) Contacted: [name], NP  Current antibiotics: Azithromycin and Rocephin  Changes to prescribed antibiotics recommended: Strep is likely contaminant.Patient is on recommended antibiotics - No changes needed  Results for orders placed or performed during the hospital encounter of 02/27/19  Blood Culture ID Panel (Reflexed) (Collected: 02/27/2019  1:20 AM)  Result Value Ref Range   Enterococcus species NOT DETECTED NOT DETECTED   Listeria monocytogenes NOT DETECTED NOT DETECTED   Staphylococcus species DETECTED (A) NOT DETECTED   Staphylococcus aureus (BCID) NOT DETECTED NOT DETECTED   Methicillin resistance NOT DETECTED NOT DETECTED   Streptococcus species NOT DETECTED NOT DETECTED   Streptococcus agalactiae NOT DETECTED NOT DETECTED   Streptococcus pneumoniae NOT DETECTED NOT DETECTED   Streptococcus pyogenes NOT DETECTED NOT DETECTED   Acinetobacter baumannii NOT DETECTED NOT DETECTED   Enterobacteriaceae species NOT DETECTED NOT DETECTED   Enterobacter cloacae complex NOT DETECTED NOT DETECTED   Escherichia coli NOT DETECTED NOT DETECTED   Klebsiella oxytoca NOT DETECTED NOT DETECTED   Klebsiella pneumoniae NOT DETECTED NOT DETECTED   Proteus species NOT DETECTED NOT DETECTED   Serratia marcescens NOT DETECTED NOT DETECTED   Haemophilus influenzae NOT DETECTED NOT DETECTED   Neisseria meningitidis NOT DETECTED NOT DETECTED   Pseudomonas aeruginosa NOT DETECTED NOT DETECTED   Candida albicans NOT DETECTED NOT DETECTED   Candida glabrata NOT DETECTED NOT DETECTED   Candida krusei NOT DETECTED NOT DETECTED   Candida parapsilosis NOT DETECTED NOT DETECTED    Candida tropicalis NOT DETECTED NOT DETECTED    [name], PharmD, BCPS Clinical pharmacist  **Pharmacist phone directory can now be found on [email or web address] ([email or web address]).  Listed under [place]. 02/27/2019  10:37 PM

## 2019-02-27 NOTE — ED Notes (Signed)
Daughter Jenny Reichmann - [phone] Ardis Hughs - [phone]

## 2019-02-27 NOTE — Progress Notes (Signed)
PROGRESS NOTE    Eric [name]  MRN:[id number] DOB: 1932-12-04 DOA: 02/27/2019 PCP: [name], DO   Brief Narrative:  HPI On 3/18/220 by Dr. Maryln Gottron Lubinski is a 85 y.o. male with medical history significant of hypertension, hyperlipidemia, GERD, depression, anxiety, sCHF, DVT, A. fib on Eliquis, CKD-3, who presents with fever, chills, cough and shortness of breath.  Patient states that he has been having shortness breath in the past several days, which has worsened tonight.  He also has dry cough, fever and chils and sneezing.  Patient also reports lower chest pain, which is intermittent, moderate, sharp, pleuritic, aggravated by deep breaths.  He states that he has chronic mild intermittent diarrhea, which has not changed.  Currently no nausea vomiting, abdominal pain.  Denies symptoms of UTI or unilateral weakness.  Patient denies sick contact.  No recent traveling.  Patient states that he eat outside in restaurants on Sunday. Per EDP, EMS reported that pt had wheezing earlier. Assessment & Plan   Admitted earlier today by Dr. Niu. See H&P for full details.  Sepsis/acute on chronic respiratory failure with hypoxia secondary to bronchitis and rhinovirus -Patient presented with leukocytosis, fever, tachycardia, tachypnea with an elevated lactic acid -Chest x-ray unremarkable for infection -Patient currently on 3 L of oxygen, maintaining saturations low 90s -Influenza PCR negative -Respiratory viral panel positive for rhinovirus -Patient placed on azithromycin and ceftriaxone given possibility of early onset pneumonia -Blood cultures and sputum cultures pending -Calcitonin unremarkable -Continue normal saline, nebulizer treatments, Breo inhaler, Mucinex -Urine strep pneumonia antigen pending   Paroxysmal atrial fibrillation -CHADSVASC 4 -Continue Eliquis -Continue metoprolol, amiodarone  Essential hypertension -Continue metoprolol, IV hydralazine as needed -Lasix held due to  sepsis  Chronic diastolic heart failure -Echocardiogram 10/20/2018 shows an EF of 60 to 65%, grade 2 diastolic dysfunction -Chest x-ray unremarkable for pulmonary edema -Appears to be compensated and stable -Lasix held due to sepsis Continue to monitor intake and output and daily weights  Chronic kidney disease, stage III -Creatinine appears to be stable  Mildly elevated LFTs -Suspect secondary to sepsis -Hepatitis panel pending  -Continue to monitor CMP  Hyperlipidemia -Continue statin  History of DVT  -Continue Eliquis  DVT Prophylaxis  Eliquis  Code Status: Full  Family Communication: None at bedside  Disposition Plan: Admitted, Dispo pending improvement in symptoms  Consultants none  Procedures  none  Antibiotics   Anti-infectives (From admission, onward)   Start     Dose/Rate Route Frequency Ordered Stop   02/28/19 0800  azithromycin (ZITHROMAX) 500 mg in sodium chloride 0.9 % 250 mL IVPB     500 mg 250 mL/hr over 60 Minutes Intravenous Every 24 hours 02/27/19 0540     02/28/19 0800  cefTRIAXone (ROCEPHIN) 1 g in sodium chloride 0.9 % 100 mL IVPB     1 g 200 mL/hr over 30 Minutes Intravenous Every 24 hours 02/27/19 0540     02/27/19 0300  cefTRIAXone (ROCEPHIN) 1 g in sodium chloride 0.9 % 100 mL IVPB     1 g 200 mL/hr over 30 Minutes Intravenous  Once 02/27/19 0248 02/27/19 0530   02/27/19 0300  azithromycin (ZITHROMAX) 500 mg in sodium chloride 0.9 % 250 mL IVPB     50 0 mg 250 mL/hr over 60 Minutes Intravenous  Once 02/27/19 0248 02/27/19 0533      Subjective:   Eric [name] seen and examined today.  Patient feeling very sleepy and continues to have shortness of breath.  Objective:  Vitals:   02/27/19 0600 02/27/19 0630 02/27/19 0801 02/27/19 0820  BP: (!) 99/58 113/64 119/74   Pulse: 81 80 85   Resp: 18 18 16    Temp:   99 F (37.2 C)   TempSrc:   Oral   SpO2: 93% 94% 97% 93%  Weight:      Height:        Intake/Output Summary (Last 24  hours) at 02/27/2019 1604 Last data filed at 02/27/2019 1500 Gross per 24 hour  Intake 1000 ml  Output -  Net 1000 ml   Filed Weights   02/27/19 0118  Weight: 86.2 kg    Exam- no exam as patient admitted earlier today   Data Reviewed: I have personally reviewed following labs and imaging studies  CBC: Recent Labs  Lab 02/27/19 0145  WBC 13.0*  NEUTROABS 7.0  HGB 15.2  HCT 47.4  MCV 100.0  PLT 450   Basic Metabolic Panel: Recent Labs  Lab 02/27/19 0145  NA 139  K 4.5  CL 104  CO2 24  GLUCOSE 103*  BUN 18  CREATININE 1.46*  CALCIUM 9.2   GFR: Estimated Creatinine Clearance: 38.8 mL/min (A) (by C-G formula based on SCr of 1.46 mg/dL (H)). Liver Function Tests: Recent Labs  Lab 02/27/19 0145  AST 87*  ALT 61*  ALKPHOS 58  BILITOT 1.2  PROT 7.4  ALBUMIN 4.3   No results for input(s): LIPASE, AMYLASE in the last 168 hours. No results for input(s): AMMONIA in the last 168 hours. Coagulation Profile: No results for input(s): INR, PROTIME in the last 168 hours. Cardiac Enzymes: No results for input(s): CKTOTAL, CKMB, CKMBINDEX, TROPONINI in the last 168 hours. BNP (last 3 results) No results for input(s): PROBNP in the last 8760 hours. HbA1C: No results for input(s): HGBA1C in the last 72 hours. CBG: No results for input(s): GLUCAP in the last 168 hours. Lipid Profile: No results for input(s): CHOL, HDL, LDLCALC, TRIG, CHOLHDL, LDLDIRECT in the last 72 hours. Thyroid Function Tests: No results for input(s): TSH, T4TOTAL, FREET4, T3FREE, THYROIDAB in the last 72 hours. Anemia Panel: No results for input(s): VITAMINB12, FOLATE, FERRITIN, TIBC, IRON, RETICCTPCT in the last 72 hours. Urine analysis:    Component Value Date/Time   COLORURINE YELLOW 02/02/2011 1025   APPEARANCEUR Clear 07/27/2017 0954   LABSPEC 1.021 02/02/2011 1025   PHURINE 6.5 02/02/2011 1025   GLUCOSEU Negative 07/27/2017 0954   HGBUR NEGATIVE 02/02/2011 1025   BILIRUBINUR Negative  07/27/2017 0954   KETONESUR NEGATIVE 02/02/2011 1025   PROTEINUR Negative 07/27/2017 0954   PROTEINUR NEGATIVE 02/02/2011 1025   UROBILINOGEN 0.2 02/02/2011 1025   NITRITE Negative 07/27/2017 0954   NITRITE NEGATIVE 02/02/2011 1025   LEUKOCYTESUR Negative 07/27/2017 0954   Sepsis Labs: @LABRCNTIP (procalcitonin:4,lacticidven:4)  ) Recent Results (from the past 240 hour(s))  Respiratory Panel by PCR     Status: Abnormal   Collection Time: 02/27/19  1:48 AM  Result Value Ref Range Status   Adenovirus NOT DETECTED NOT DETECTED Final   Coronavirus 229E NOT DETECTED NOT DETECTED Final    Comment: (NOTE) The Coronavirus on the Respiratory Panel, DOES NOT test for the novel  Coronavirus (2019 nCoV)    Coronavirus HKU1 NOT DETECTED NOT DETECTED Final   Coronavirus NL63 NOT DETECTED NOT DETECTED Final   Coronavirus OC43 NOT DETECTED NOT DETECTED Final   Metapneumovirus NOT DETECTED NOT DETECTED Final   Rhinovirus / Enterovirus DETECTED (A) NOT DETECTED Final   Influenza A NOT DETECTED NOT  DETECTED Final   Influenza B NOT DETECTED NOT DETECTED Final   Parainfluenza Virus 1 NOT DETECTED NOT DETECTED Final   Parainfluenza Virus 2 NOT DETECTED NOT DETECTED Final   Parainfluenza Virus 3 NOT DETECTED NOT DETECTED Final   Parainfluenza Virus 4 NOT DETECTED NOT DETECTED Final   Respiratory Syncytial Virus NOT DETECTED NOT DETECTED Final   Bordetella pertussis NOT DETECTED NOT DETECTED Final   Chlamydophila pneumoniae NOT DETECTED NOT DETECTED Final   Mycoplasma pneumoniae NOT DETECTED NOT DETECTED Final    Comment: Performed at [facility], [place] [address] [place], [place] [address]      Radiology Studies: Dg Chest Portable 1 View  Result Date: 02/27/2019 CLINICAL DATA:  Cough with fever.  Short of breath. EXAM: PORTABLE CHEST 1 VIEW COMPARISON:  10/16/2018 FINDINGS: Cardiac silhouette borderline enlarged. No mediastinal or hilar masses or convincing adenopathy. Clear lungs.  No  pleural effusion or pneumothorax. Stable changes from a previous posterior cervical spine fusion. Skeletal structures are demineralized but grossly intact. IMPRESSION: No active disease. Electronically Signed   By: Lajean Manes M.D.   On: 02/27/2019 02:29     Scheduled Meds: . amiodarone  200 mg Oral Daily  . apixaban  5 mg Oral BID  . cholecalciferol  1,000 Units Oral Daily  . fluticasone furoate-vilanterol  1 puff Inhalation Daily  . gabapentin  300 mg Oral BID  . loratadine  10 mg Oral Daily  . metoprolol tartrate  25 mg Oral BID  . pantoprazole  40 mg Oral QHS  . pravastatin  80 mg Oral Daily   Continuous Infusions: . sodium chloride 125 mL/hr at 02/27/19 0616  . [START ON 02/28/2019] azithromycin    . [START ON 02/28/2019] cefTRIAXone (ROCEPHIN)  IV       LOS: 0 days   Time Spent in minutes   30 minutes  Katelee Schupp D.O. on 02/27/2019 at 4:04 PM  Between 7am to 7pm - Please see pager noted on [email or web address]  After 7pm go to [email or web address]  And look for the night coverage person covering for me after hours  [facility] Office  [phone] \

## 2019-02-27 NOTE — Progress Notes (Signed)
From home per EMS for worsening SOB and dry cough. CPAP in route to the ED due to increase WOB no hypoxia noted. On arrival patient talking in full unbroken sentences. No respiratory distress noted. Patient placed on aerosol mask to finish up his 10mg  albuterol neb that EMS started. No complications noted. Patient is stable at this time. RRT will monitor as needed

## 2019-02-27 NOTE — Discharge Instructions (Signed)
Information on my medicine - ELIQUIS® (apixaban) ° °This medication education was reviewed with me or my healthcare representative as part of my discharge preparation.  The pharmacist that spoke with me during my hospital stay was:  Angelene Rome, RPH-CPP ° °Why was Eliquis® prescribed for you? °Eliquis® was prescribed for you to reduce the risk of a blood clot forming that can cause a stroke if you have a medical condition called atrial fibrillation (a type of irregular heartbeat). ° °What do You need to know about Eliquis® ? °Take your Eliquis® TWICE DAILY - one tablet in the morning and one tablet in the evening with or without food. If you have difficulty swallowing the tablet whole please discuss with your pharmacist how to take the medication safely. ° °Take Eliquis® exactly as prescribed by your doctor and DO NOT stop taking Eliquis® without talking to the doctor who prescribed the medication.  Stopping Maciver increase your risk of developing a stroke.  Refill your prescription before you run out. ° °After discharge, you should have regular check-up appointments with your healthcare provider that is prescribing your Eliquis®.  In the future your dose [place] need to be changed if your kidney function or weight changes by a significant amount or as you get older. ° °What do you do if you miss a dose? °If you miss a dose, take it as soon as you remember on the same day and resume taking twice daily.  Do not take more than one dose of ELIQUIS at the same time to make up a missed dose. ° °Important Safety Information °A possible side effect of Eliquis® is bleeding. You should call your healthcare provider right away if you experience any of the following: °? Bleeding from an injury or your nose that does not stop. °? Unusual colored urine (red or dark brown) or unusual colored stools (red or black). °? Unusual bruising for unknown reasons. °? A serious fall or if you hit your head (even if there is no bleeding). ° °Some  medicines [place] interact with Eliquis® and might increase your risk of bleeding or clotting while on Eliquis®. To help avoid this, consult your healthcare provider or pharmacist prior to using any new prescription or non-prescription medications, including herbals, vitamins, non-steroidal anti-inflammatory drugs (NSAIDs) and supplements. ° °This website has more information on Eliquis® (apixaban): http://www.eliquis.com/eliquis/home ° °

## 2019-02-27 NOTE — ED Triage Notes (Signed)
BIB EMS from home. Called out for SOB X few days that worsened tonight. Cough present, low grade fever. No sick contacts. En route inc WOB, started on CPAP. Given 125 SoluMedrol, 10 Albuterol, 0.5 Atrovent. Transitioned to [facility] upon arrival.

## 2019-02-27 NOTE — ED Notes (Signed)
Dr. Blaine Hamper responded to this RN regarding patient's lactic. He also advised patient is stable and is still appropriate for medical telemetry

## 2019-02-28 DIAGNOSIS — E782 Mixed hyperlipidemia: Secondary | ICD-10-CM

## 2019-02-28 DIAGNOSIS — B348 Other viral infections of unspecified site: Secondary | ICD-10-CM

## 2019-02-28 LAB — CBC
HCT: 38.2 % — ABNORMAL LOW (ref 39.0–52.0)
Hemoglobin: 12 g/dL — ABNORMAL LOW (ref 13.0–17.0)
MCH: 31.4 pg (ref 26.0–34.0)
MCHC: 31.4 g/dL (ref 30.0–36.0)
MCV: 100 fL (ref 80.0–100.0)
PLATELETS: 170 10*3/uL (ref 150–400)
RBC: 3.82 MIL/uL — ABNORMAL LOW (ref 4.22–5.81)
RDW: 13.1 % (ref 11.5–15.5)
WBC: 11.2 10*3/uL — AB (ref 4.0–10.5)
nRBC: 0 % (ref 0.0–0.2)

## 2019-02-28 LAB — BASIC METABOLIC PANEL
Anion gap: 7 (ref 5–15)
BUN: 20 mg/dL (ref 8–23)
CO2: 22 mmol/L (ref 22–32)
Calcium: 8.2 mg/dL — ABNORMAL LOW (ref 8.9–10.3)
Chloride: 112 mmol/L — ABNORMAL HIGH (ref 98–111)
Creatinine, Ser: 1.2 mg/dL (ref 0.61–1.24)
GFR calc Af Amer: >60 mL/min (ref >60)
GFR, EST NON AFRICAN AMERICAN: 55 mL/min — AB (ref >60)
Glucose, Bld: 121 mg/dL — ABNORMAL HIGH (ref 70–99)
Potassium: 4.2 mmol/L (ref 3.5–5.1)
SODIUM: 141 mmol/L (ref 135–145)

## 2019-02-28 LAB — STREP PNEUMONIAE URINARY ANTIGEN: STREP PNEUMO URINARY ANTIGEN: NEGATIVE

## 2019-02-28 LAB — URINALYSIS, ROUTINE W REFLEX MICROSCOPIC
BILIRUBIN URINE: NEGATIVE
Glucose, UA: NEGATIVE mg/dL
Hgb urine dipstick: NEGATIVE
Ketones, ur: NEGATIVE mg/dL
Leukocytes,Ua: NEGATIVE
Nitrite: NEGATIVE
Protein, ur: NEGATIVE mg/dL
Specific Gravity, Urine: 1.02 (ref 1.005–1.030)
pH: 5 (ref 5.0–8.0)

## 2019-02-28 LAB — HEPATITIS PANEL, ACUTE
HEP A IGM: NEGATIVE
Hep B C IgM: NEGATIVE
Hepatitis B Surface Ag: NEGATIVE

## 2019-02-28 LAB — LACTIC ACID, PLASMA
Lactic Acid, Venous: 1.6 mmol/L (ref 0.5–1.9)
Lactic Acid, Venous: 2.3 mmol/L (ref 0.5–1.9)

## 2019-02-28 MED ORDER — METHYLPREDNISOLONE SODIUM SUCC 40 MG IJ SOLR
40.0000 mg | Freq: Two times a day (BID) | INTRAMUSCULAR | Status: DC
  Administered 2019-02-28 – 2019-03-02 (×4): 40 mg via INTRAVENOUS
  Filled 2019-02-28 (×4): qty 1

## 2019-02-28 NOTE — Progress Notes (Signed)
Called lab to check on Lactic Acid as it was drawn around 0100 with daily labs. Lab stated that there was a tube sent down but had no label on it.  Called phlebotomy to redraw lab.

## 2019-02-28 NOTE — Progress Notes (Signed)
PROGRESS NOTE    Eric [name]  MRN:[id number] DOB: 08/06/1932 DOA: 02/27/2019 PCP: [name], DO   Brief Narrative:  HPI On 3/18/220 by Dr. Maryln Gottron Eric [name] is a 85 y.o. male with medical history significant of hypertension, hyperlipidemia, GERD, depression, anxiety, sCHF, DVT, A. fib on Eliquis, CKD-3, who presents with fever, chills, cough and shortness of breath.  Patient states that he has been having shortness breath in the past several days, which has worsened tonight.  He also has dry cough, fever and chils and sneezing.  Patient also reports lower chest pain, which is intermittent, moderate, sharp, pleuritic, aggravated by deep breaths.  He states that he has chronic mild intermittent diarrhea, which has not changed.  Currently no nausea vomiting, abdominal pain.  Denies symptoms of UTI or unilateral weakness.  Patient denies sick contact.  No recent traveling.  Patient states that he eat outside in restaurants on Sunday. Per EDP, EMS reported that pt had wheezing earlier. Assessment & Plan   Sepsis/acute on chronic respiratory failure with hypoxia secondary to bronchitis and rhinovirus -Patient presented with leukocytosis, fever, tachycardia, tachypnea with an elevated lactic acid  -sepsis morphology improving, lactic acid improved (5.4 --> 1.6) -Chest x-ray unremarkable for infection -Influenza PCR negative -Respiratory viral panel positive for rhinovirus -Patient was placed on azithromycin and ceftriaxone given possibility of early onset pneumonia- antibiotics discontinued  -Blood cultures 1/4 +GPC (likely contaminant) -procalcitonin unremarkable -Continue normal saline, nebulizer treatments, Breo inhaler, Mucinex -Urine strep pneumonia antigen pending  -was requiring 3L of oxygen, now weaned down to 1L and containing oxygen saturations in the mid 90s -Patient continues to have audible wheezing on examination, will order Solu-Medrol  Paroxysmal atrial fibrillation  -CHADSVASC 4 -Continue Eliquis -Continue metoprolol, amiodarone  Essential hypertension -Continue metoprolol, IV hydralazine as needed -Lasix held due to sepsis  Chronic diastolic heart failure -Echocardiogram 10/20/2018 shows an EF of 60 to 65%, grade 2 diastolic dysfunction -Chest x-ray unremarkable for pulmonary edema -Appears to be compensated and stable -Lasix held due to sepsis -Continue to monitor intake and output and daily weights  Chronic kidney disease, stage III -Creatinine appears to be stable  Mildly elevated LFTs -Suspect secondary to sepsis -Hepatitis panel pending  -Continue to monitor CMP  Hyperlipidemia -Continue statin  History of DVT  -Continue Eliquis  DVT Prophylaxis  Eliquis  Code Status: Full  Family Communication: None at bedside  Disposition Plan: Admitted, Dispo pending improvement in symptoms  Consultants none  Procedures  none  Antibiotics   Anti-infectives (From admission, onward)   Start     Dose/Rate Route Frequency Ordered Stop   02/28/19 0800  azithromycin (ZITHROMAX) 500 mg in sodium chloride 0.9 % 250 mL IVPB  Status:  Discontinued     500 mg 250 mL/hr over 60 Minutes Intravenous Every 24 hours 02/27/19 0540 02/28/19 1215   02/28/19 0800  cefTRIAXone (ROCEPHIN) 1 g in sodium chloride 0.9 % 100 mL IVPB  Status:  Discontinued     1 g 200 mL/hr over 30 Minutes Intravenous Every 24 hours 02/27/19 0540 02/28/19 1215   02/27/19 0300  cefTRIAXone (ROCEPHIN) 1 g in sodium chloride 0.9 % 100 mL IVPB     1 g 200 mL/hr over 30 Minutes Intravenous  Once 02/27/19 0248 02/27/19 0530   02/27/19 0300  azithromycin (ZITHROMAX) 500 mg in sodium chloride 0.9 % 250 mL IVPB     50 0 mg 250 mL/hr over 60 Minutes Intravenous  Once 02/27/19 0248 02/27/19 0533  Subjective:   Eric [name] seen and examined today.  Continues to have shortness of breath and wheezing along with cough.  Does not feel that his breathing has improved very much since  admission.  Denies current chest pain, abdominal pain, nausea or vomiting, diarrhea or constipation, dizziness or headache.  Objective:   Vitals:   02/27/19 2200 02/27/19 2300 02/28/19 0818 02/28/19 1000  BP:  119/72 (!) 148/76   Pulse:  72 63   Resp:  20 18   Temp:  98.8 F (37.1 C) 97.9 F (36.6 C)   TempSrc:  Oral Oral   SpO2: 97% 96% 94% 96%  Weight:      Height:        Intake/Output Summary (Last 24 hours) at 02/28/2019 1315 Last data filed at 02/28/2019 1033 Gross per 24 hour  Intake 3520.76 ml  Output 851 ml  Net 2669.76 ml   Filed Weights   02/27/19 0118  Weight: 86.2 kg   Exam  General: Well developed, well nourished, NAD, appears stated age  77: NCAT,  mucous membranes moist.   Cardiovascular: S1 S2 auscultated, RRR  Respiratory: Diminished breath sounds, diffuse expiratory wheezing  Abdomen: Soft, nontender, nondistended, + bowel sounds  Extremities: warm dry without cyanosis clubbing or edema  Neuro: AAOx3, nonfocal  Psych: Appropriate mood and affect   Data Reviewed: I have personally reviewed following labs and imaging studies  CBC: Recent Labs  Lab 02/27/19 0145 02/28/19 0059  WBC 13.0* 11.2*  NEUTROABS 7.0  --   HGB 15.2 12.0*  HCT 47.4 38.2*  MCV 100.0 100.0  PLT 204 671   Basic Metabolic Panel: Recent Labs  Lab 02/27/19 0145 02/28/19 0059  NA 139 141  K 4.5 4.2  CL 104 112*  CO2 24 22  GLUCOSE 103* 121*  BUN 18 20  CREATININE 1.46* 1.20  CALCIUM 9.2 8.2*   GFR: Estimated Creatinine Clearance: 47.2 mL/min (by C-G formula based on SCr of 1.2 mg/dL). Liver Function Tests: Recent Labs  Lab 02/27/19 0145  AST 87*  ALT 61*  ALKPHOS 58  BILITOT 1.2  PROT 7.4  ALBUMIN 4.3   No results for input(s): LIPASE, AMYLASE in the last 168 hours. No results for input(s): AMMONIA in the last 168 hours. Coagulation Profile: No results for input(s): INR, PROTIME in the last 168 hours. Cardiac Enzymes: No results for input(s):  CKTOTAL, CKMB, CKMBINDEX, TROPONINI in the last 168 hours. BNP (last 3 results) No results for input(s): PROBNP in the last 8760 hours. HbA1C: No results for input(s): HGBA1C in the last 72 hours. CBG: No results for input(s): GLUCAP in the last 168 hours. Lipid Profile: No results for input(s): CHOL, HDL, LDLCALC, TRIG, CHOLHDL, LDLDIRECT in the last 72 hours. Thyroid Function Tests: No results for input(s): TSH, T4TOTAL, FREET4, T3FREE, THYROIDAB in the last 72 hours. Anemia Panel: No results for input(s): VITAMINB12, FOLATE, FERRITIN, TIBC, IRON, RETICCTPCT in the last 72 hours. Urine analysis:    Component Value Date/Time   COLORURINE YELLOW 02/02/2011 1025   APPEARANCEUR Clear 07/27/2017 0954   LABSPEC 1.021 02/02/2011 1025   PHURINE 6.5 02/02/2011 1025   GLUCOSEU Negative 07/27/2017 0954   HGBUR NEGATIVE 02/02/2011 1025   BILIRUBINUR Negative 07/27/2017 [place] 02/02/2011 1025   PROTEINUR Negative 07/27/2017 0954   PROTEINUR NEGATIVE 02/02/2011 1025   UROBILINOGEN 0.2 02/02/2011 1025   NITRITE Negative 07/27/2017 0954   NITRITE NEGATIVE 02/02/2011 1025   LEUKOCYTESUR Negative 07/27/2017 0954   Sepsis  Labs: @LABRCNTIP (procalcitonin:4,lacticidven:4)  ) Recent Results (from the past 240 hour(s))  Culture, blood (routine x 2)     Status: None (Preliminary result)   Collection Time: 02/27/19  1:20 AM  Result Value Ref Range Status   Specimen Description BLOOD RIGHT FOREARM  Final   Special Requests   Final    BOTTLES DRAWN AEROBIC AND ANAEROBIC Blood Culture results [place] not be optimal due to an inadequate volume of blood received in culture bottles   Culture  Setup Time   Final    GRAM POSITIVE COCCI ANAEROBIC BOTTLE ONLY CRITICAL RESULT CALLED TO, READ BACK BY AND VERIFIED WITH: K AMEND PHARMD 2228 02/27/19 A BROWNING    Culture   Final    GRAM POSITIVE COCCI CULTURE REINCUBATED FOR BETTER GROWTH Performed at [facility], [place] [address], [place], [place] [address]    Report Status PENDING  Incomplete  Blood Culture ID Panel (Reflexed)     Status: Abnormal   Collection Time: 02/27/19  1:20 AM  Result Value Ref Range Status   Enterococcus species NOT DETECTED NOT DETECTED Final   Listeria monocytogenes NOT DETECTED NOT DETECTED Final   Staphylococcus species DETECTED (A) NOT DETECTED Final    Comment: Methicillin (oxacillin) susceptible coagulase negative staphylococcus. Possible blood culture contaminant (unless isolated from more than one blood culture draw or clinical case suggests pathogenicity). No antibiotic treatment is indicated for blood  culture contaminants. CRITICAL RESULT CALLED TO, READ BACK BY AND VERIFIED WITH: K AMEND PHARMD 2228 02/27/19 A BROWNING    Staphylococcus aureus (BCID) NOT DETECTED NOT DETECTED Final   Methicillin resistance NOT DETECTED NOT DETECTED Final   Streptococcus species NOT DETECTED NOT DETECTED Final   Streptococcus agalactiae NOT DETECTED NOT DETECTED Final   Streptococcus pneumoniae NOT DETECTED NOT DETECTED Final   Streptococcus pyogenes NOT DETECTED NOT DETECTED Final   Acinetobacter baumannii NOT DETECTED NOT DETECTED Final   Enterobacteriaceae species NOT DETECTED NOT DETECTED Final   Enterobacter cloacae complex NOT DETECTED NOT DETECTED Final   Escherichia coli NOT DETECTED NOT DETECTED Final   Klebsiella oxytoca NOT DETECTED NOT DETECTED Final   Klebsiella pneumoniae NOT DETECTED NOT DETECTED Final   Proteus species NOT DETECTED NOT DETECTED Final   Serratia marcescens NOT DETECTED NOT DETECTED Final   Haemophilus influenzae NOT DETECTED NOT DETECTED Final   Neisseria meningitidis NOT DETECTED NOT DETECTED Final   Pseudomonas aeruginosa NOT DETECTED NOT DETECTED Final   Candida albicans NOT DETECTED NOT DETECTED Final   Candida glabrata NOT DETECTED NOT DETECTED Final   Candida krusei NOT DETECTED NOT DETECTED Final   Candida parapsilosis NOT DETECTED NOT DETECTED Final    Candida tropicalis NOT DETECTED NOT DETECTED Final    Comment: Performed at [facility], [address], [place], [place] [address]  Culture, blood (routine x 2)     Status: None (Preliminary result)   Collection Time: 02/27/19  1:30 AM  Result Value Ref Range Status   Specimen Description BLOOD LEFT ANTECUBITAL  Final   Special Requests   Final    BOTTLES DRAWN AEROBIC AND ANAEROBIC Blood Culture results [place] not be optimal due to an inadequate volume of blood received in culture bottles   Culture   Final    NO GROWTH 1 DAY Performed at [facility], [place] [address] [place], [place] [address]    Report Status PENDING  Incomplete  Respiratory Panel by PCR     Status: Abnormal  Collection Time: 02/27/19  1:48 AM  Result Value Ref Range Status   Adenovirus NOT DETECTED NOT DETECTED Final   Coronavirus 229E NOT DETECTED NOT DETECTED Final    Comment: (NOTE) The Coronavirus on the Respiratory Panel, DOES NOT test for the novel  Coronavirus (2019 nCoV)    Coronavirus HKU1 NOT DETECTED NOT DETECTED Final   Coronavirus NL63 NOT DETECTED NOT DETECTED Final   Coronavirus OC43 NOT DETECTED NOT DETECTED Final   Metapneumovirus NOT DETECTED NOT DETECTED Final   Rhinovirus / Enterovirus DETECTED (A) NOT DETECTED Final   Influenza A NOT DETECTED NOT DETECTED Final   Influenza B NOT DETECTED NOT DETECTED Final   Parainfluenza Virus 1 NOT DETECTED NOT DETECTED Final   Parainfluenza Virus 2 NOT DETECTED NOT DETECTED Final   Parainfluenza Virus 3 NOT DETECTED NOT DETECTED Final   Parainfluenza Virus 4 NOT DETECTED NOT DETECTED Final   Respiratory Syncytial Virus NOT DETECTED NOT DETECTED Final   Bordetella pertussis NOT DETECTED NOT DETECTED Final   Chlamydophila pneumoniae NOT DETECTED NOT DETECTED Final   Mycoplasma pneumoniae NOT DETECTED NOT DETECTED Final    Comment: Performed at [facility], [address], [place], [place] [address]      Radiology Studies: Dg  Chest Portable 1 View  Result Date: 02/27/2019 CLINICAL DATA:  Cough with fever.  Short of breath. EXAM: PORTABLE CHEST 1 VIEW COMPARISON:  10/16/2018 FINDINGS: Cardiac silhouette borderline enlarged. No mediastinal or hilar masses or convincing adenopathy. Clear lungs.  No pleural effusion or pneumothorax. Stable changes from a previous posterior cervical spine fusion. Skeletal structures are demineralized but grossly intact. IMPRESSION: No active disease. Electronically Signed   By: Lajean Manes M.D.   On: 02/27/2019 02:29     Scheduled Meds: . amiodarone  200 mg Oral Daily  . apixaban  5 mg Oral BID  . cholecalciferol  1,000 Units Oral Daily  . fluticasone furoate-vilanterol  1 puff Inhalation Daily  . gabapentin  300 mg Oral BID  . loratadine  10 mg Oral Daily  . metoprolol tartrate  25 mg Oral BID  . pantoprazole  40 mg Oral QHS  . pravastatin  80 mg Oral Daily   Continuous Infusions:    LOS: 1 day   Time Spent in minutes   30 minutes  Justin Meisenheimer D.O. on 02/28/2019 at 1:15 PM  Between 7am to 7pm - Please see pager noted on [email or web address]  After 7pm go to [email or web address]  And look for the night coverage person covering for me after hours  [facility] Office  [phone] \

## 2019-03-01 ENCOUNTER — Inpatient Hospital Stay (HOSPITAL_COMMUNITY): Payer: PPO

## 2019-03-01 ENCOUNTER — Telehealth: Payer: Self-pay | Admitting: Pulmonary Disease

## 2019-03-01 LAB — CULTURE, BLOOD (ROUTINE X 2)

## 2019-03-01 LAB — COMPREHENSIVE METABOLIC PANEL
ALBUMIN: 3.5 g/dL (ref 3.5–5.0)
ALT: 67 U/L — ABNORMAL HIGH (ref 0–44)
AST: 86 U/L — ABNORMAL HIGH (ref 15–41)
Alkaline Phosphatase: 42 U/L (ref 38–126)
Anion gap: 5 (ref 5–15)
BILIRUBIN TOTAL: 0.8 mg/dL (ref 0.3–1.2)
BUN: 22 mg/dL (ref 8–23)
CO2: 28 mmol/L (ref 22–32)
Calcium: 8.6 mg/dL — ABNORMAL LOW (ref 8.9–10.3)
Chloride: 106 mmol/L (ref 98–111)
Creatinine, Ser: 1.3 mg/dL — ABNORMAL HIGH (ref 0.61–1.24)
GFR calc Af Amer: 58 mL/min — ABNORMAL LOW (ref >60)
GFR calc non Af Amer: 50 mL/min — ABNORMAL LOW (ref >60)
Glucose, Bld: 111 mg/dL — ABNORMAL HIGH (ref 70–99)
POTASSIUM: 4.3 mmol/L (ref 3.5–5.1)
SODIUM: 139 mmol/L (ref 135–145)
Total Protein: 6.5 g/dL (ref 6.5–8.1)

## 2019-03-01 LAB — CBC
HCT: 39.7 % (ref 39.0–52.0)
Hemoglobin: 12.8 g/dL — ABNORMAL LOW (ref 13.0–17.0)
MCH: 32.4 pg (ref 26.0–34.0)
MCHC: 32.2 g/dL (ref 30.0–36.0)
MCV: 100.5 fL — ABNORMAL HIGH (ref 80.0–100.0)
Platelets: 193 10*3/uL (ref 150–400)
RBC: 3.95 MIL/uL — AB (ref 4.22–5.81)
RDW: 13.1 % (ref 11.5–15.5)
WBC: 8.3 10*3/uL (ref 4.0–10.5)
nRBC: 0 % (ref 0.0–0.2)

## 2019-03-01 NOTE — Progress Notes (Addendum)
PROGRESS NOTE    Eric [name]  MRN:[id number] DOB: [birth year] DOA: 02/27/2019 PCP: [name], DO   Brief Narrative:  HPI On 3/18/220 by Dr. Maryln Gottron Boothe is a 85 y.o. male with medical history significant of hypertension, hyperlipidemia, GERD, depression, anxiety, sCHF, DVT, A. fib on Eliquis, CKD-3, who presents with fever, chills, cough and shortness of breath.  Patient states that he has been having shortness breath in the past several days, which has worsened tonight.  He also has dry cough, fever and chils and sneezing.  Patient also reports lower chest pain, which is intermittent, moderate, sharp, pleuritic, aggravated by deep breaths.  He states that he has chronic mild intermittent diarrhea, which has not changed.  Currently no nausea vomiting, abdominal pain.  Denies symptoms of UTI or unilateral weakness.  Patient denies sick contact.  No recent traveling.  Patient states that he eat outside in restaurants on Sunday. Per EDP, EMS reported that pt had wheezing earlier.  Interim history Admitted for sepsis and chronic respiratory failure secondary to bronchitis and rhinovirus.  Improving slowly. Assessment & Plan   Sepsis/acute on chronic respiratory failure with hypoxia secondary to bronchitis and rhinovirus -Patient presented with leukocytosis, fever, tachycardia, tachypnea with an elevated lactic acid  -sepsis morphology improving, lactic acid improved (5.4 --> 1.6) -Chest x-ray unremarkable for infection -Influenza PCR negative -Respiratory viral panel positive for rhinovirus -Patient was placed on azithromycin and ceftriaxone given possibility of early onset pneumonia- antibiotics discontinued  -Blood cultures 1/4 +GPC (likely contaminant) -procalcitonin unremarkable -Continue normal saline, nebulizer treatments, Breo inhaler, Mucinex -Urine strep pneumonia antigen negative -was requiring 3L of oxygen, however weaning -continue solumedrol for wheezing/bronchitis   Paroxysmal atrial fibrillation -CHADSVASC 4 -Continue Eliquis -Continue metoprolol, amiodarone  Essential hypertension -Continue metoprolol, IV hydralazine as needed -Lasix held due to sepsis  Chronic diastolic heart failure -Echocardiogram 10/20/2018 shows an EF of 60 to 65%, grade 2 diastolic dysfunction -Chest x-ray unremarkable for pulmonary edema -Appears to be compensated and stable -Lasix held due to sepsis -Continue to monitor intake and output and daily weights  Chronic kidney disease, stage III -Creatinine appears to be stable  Mildly elevated LFTs -Suspect secondary to sepsis -Hepatitis panel unremarkable  -Continue to monitor CMP  Hyperlipidemia -hold statin  History of DVT  -Continue Eliquis  PPE worn: surgical mask, gown, gloves  DVT Prophylaxis  Eliquis  Code Status: Full  Family Communication: None at bedside  Disposition Plan: Admitted, Dispo pending improvement in symptoms  Consultants none  Procedures  none  Antibiotics   Anti-infectives (From admission, onward)   Start     Dose/Rate Route Frequency Ordered Stop   02/28/19 0800  azithromycin (ZITHROMAX) 500 mg in sodium chloride 0.9 % 250 mL IVPB  Status:  Discontinued     500 mg 250 mL/hr over 60 Minutes Intravenous Every 24 hours 02/27/19 0540 02/28/19 1215   02/28/19 0800  cefTRIAXone (ROCEPHIN) 1 g in sodium chloride 0.9 % 100 mL IVPB  Status:  Discontinued     1 g 200 mL/hr over 30 Minutes Intravenous Every 24 hours 02/27/19 0540 02/28/19 1215   02/27/19 0300  cefTRIAXone (ROCEPHIN) 1 g in sodium chloride 0.9 % 100 mL IVPB     1 g 200 mL/hr over 30 Minutes Intravenous  Once 02/27/19 0248 02/27/19 0530   02/27/19 0300  azithromycin (ZITHROMAX) 500 mg in sodium chloride 0.9 % 250 mL IVPB     50 0 mg 250 mL/hr over 60 Minutes Intravenous  Once 02/27/19 0248 02/27/19 0533      Subjective:   Eric [name] seen and examined today.  Patient continues to be on oxygen however feels that his  shortness of breath is improving.  Continues to have some wheezing and cough.  Denies current chest pain, abdominal pain, nausea or vomiting, diarrhea or constipation, dizziness or headache.  Objective:   Vitals:   02/28/19 1707 02/28/19 2308 03/01/19 0732 03/01/19 0914  BP: 131/79 (!) 148/74  (!) 147/82  Pulse: 65 69  71  Resp:  18    Temp: 97.8 F (36.6 C) 98.2 F (36.8 C)  97.6 F (36.4 C)  TempSrc: Oral Oral  Oral  SpO2: 92% 94% 95% 93%  Weight:      Height:        Intake/Output Summary (Last 24 hours) at 03/01/2019 0956 Last data filed at 03/01/2019 0900 Gross per 24 hour  Intake 616.6 ml  Output 1001 ml  Net -384.4 ml   Filed Weights   02/27/19 0118  Weight: 86.2 kg   Exam  General: Well developed, well nourished, NAD, appears stated age  44: NCAT, mucous membranes moist.   Neck: Supple  Cardiovascular: S1 S2 auscultated, RRR  Respiratory: Diminished breath sounds, diffuse expiratory wheezing  Abdomen: Soft, nontender, nondistended, + bowel sounds  Extremities: warm dry without cyanosis clubbing  Neuro: AAOx3, nonfocal  Psych: Appropriate mood and affect, pleasant   Data Reviewed: I have personally reviewed following labs and imaging studies  CBC: Recent Labs  Lab 02/27/19 0145 02/28/19 0059 03/01/19 0241  WBC 13.0* 11.2* 8.3  NEUTROABS 7.0  --   --   HGB 15.2 12.0* 12.8*  HCT 47.4 38.2* 39.7  MCV 100.0 100.0 100.5*  PLT 204 170 867   Basic Metabolic Panel: Recent Labs  Lab 02/27/19 0145 02/28/19 0059 03/01/19 0241  NA 139 141 139  K 4.5 4.2 4.3  CL 104 112* 106  CO2 24 22 28   GLUCOSE 103* 121* 111*  BUN 18 20 22   CREATININE 1.46* 1.20 1.30*  CALCIUM 9.2 8.2* 8.6*   GFR: Estimated Creatinine Clearance: 43.6 mL/min (A) (by C-G formula based on SCr of 1.3 mg/dL (H)). Liver Function Tests: Recent Labs  Lab 02/27/19 0145 03/01/19 0241  AST 87* 86*  ALT 61* 67*  ALKPHOS 58 42  BILITOT 1.2 0.8  PROT 7.4 6.5  ALBUMIN 4.3 3.5    No results for input(s): LIPASE, AMYLASE in the last 168 hours. No results for input(s): AMMONIA in the last 168 hours. Coagulation Profile: No results for input(s): INR, PROTIME in the last 168 hours. Cardiac Enzymes: No results for input(s): CKTOTAL, CKMB, CKMBINDEX, TROPONINI in the last 168 hours. BNP (last 3 results) No results for input(s): PROBNP in the last 8760 hours. HbA1C: No results for input(s): HGBA1C in the last 72 hours. CBG: No results for input(s): GLUCAP in the last 168 hours. Lipid Profile: No results for input(s): CHOL, HDL, LDLCALC, TRIG, CHOLHDL, LDLDIRECT in the last 72 hours. Thyroid Function Tests: No results for input(s): TSH, T4TOTAL, FREET4, T3FREE, THYROIDAB in the last 72 hours. Anemia Panel: No results for input(s): VITAMINB12, FOLATE, FERRITIN, TIBC, IRON, RETICCTPCT in the last 72 hours. Urine analysis:    Component Value Date/Time   COLORURINE YELLOW 02/28/2019 [place] 02/28/2019 1652   APPEARANCEUR Clear 07/27/2017 0954   LABSPEC 1.020 02/28/2019 1652   PHURINE 5.0 02/28/2019 [place] 02/28/2019 1652   HGBUR NEGATIVE 02/28/2019 1652  BILIRUBINUR NEGATIVE 02/28/2019 1652   BILIRUBINUR Negative 07/27/2017 0954   KETONESUR NEGATIVE 02/28/2019 1652   PROTEINUR NEGATIVE 02/28/2019 1652   UROBILINOGEN 0.2 02/02/2011 1025   NITRITE NEGATIVE 02/28/2019 1652   LEUKOCYTESUR NEGATIVE 02/28/2019 1652   Sepsis Labs: @LABRCNTIP (procalcitonin:4,lacticidven:4)  ) Recent Results (from the past 240 hour(s))  Culture, blood (routine x 2)     Status: None (Preliminary result)   Collection Time: 02/27/19  1:20 AM  Result Value Ref Range Status   Specimen Description BLOOD RIGHT FOREARM  Final   Special Requests   Final    BOTTLES DRAWN AEROBIC AND ANAEROBIC Blood Culture results [place]ha[place] not be optimal due to an inadequate volume of blood received in culture bottles   Culture  Setup Time   Final    GRAM POSITIVE COCCI  ANAEROBIC BOTTLE ONLY CRITICAL RESULT CALLED TO, READ BACK BY AND VERIFIED WITH: K AMEND PHARMD 2228 02/27/19 A BROWNING    Culture   Final    GRAM POSITIVE COCCI IDENTIFICATION TO FOLLOW Performed at [facility], [place] [address] [place], [place] [address]    Report Status PENDING  Incomplete  Blood Culture ID Panel (Reflexed)     Status: Abnormal   Collection Time: 02/27/19  1:20 AM  Result Value Ref Range Status   Enterococcus species NOT DETECTED NOT DETECTED Final   Listeria monocytogenes NOT DETECTED NOT DETECTED Final   Staphylococcus species DETECTED (A) NOT DETECTED Final    Comment: Methicillin (oxacillin) susceptible coagulase negative staphylococcus. Possible blood culture contaminant (unless isolated from more than one blood culture draw or clinical case suggests pathogenicity). No antibiotic treatment is indicated for blood  culture contaminants. CRITICAL RESULT CALLED TO, READ BACK BY AND VERIFIED WITH: K AMEND PHARMD 2228 02/27/19 A BROWNING    Staphylococcus aureus (BCID) NOT DETECTED NOT DETECTED Final   Methicillin resistance NOT DETECTED NOT DETECTED Final   Streptococcus species NOT DETECTED NOT DETECTED Final   Streptococcus agalactiae NOT DETECTED NOT DETECTED Final   Streptococcus pneumoniae NOT DETECTED NOT DETECTED Final   Streptococcus pyogenes NOT DETECTED NOT DETECTED Final   Acinetobacter baumannii NOT DETECTED NOT DETECTED Final   Enterobacteriaceae species NOT DETECTED NOT DETECTED Final   Enterobacter cloacae complex NOT DETECTED NOT DETECTED Final   Escherichia coli NOT DETECTED NOT DETECTED Final   Klebsiella oxytoca NOT DETECTED NOT DETECTED Final   Klebsiella pneumoniae NOT DETECTED NOT DETECTED Final   Proteus species NOT DETECTED NOT DETECTED Final   Serratia marcescens NOT DETECTED NOT DETECTED Final   Haemophilus influenzae NOT DETECTED NOT DETECTED Final   Neisseria meningitidis NOT DETECTED NOT DETECTED Final   Pseudomonas aeruginosa  NOT DETECTED NOT DETECTED Final   Candida albicans NOT DETECTED NOT DETECTED Final   Candida glabrata NOT DETECTED NOT DETECTED Final   Candida krusei NOT DETECTED NOT DETECTED Final   Candida parapsilosis NOT DETECTED NOT DETECTED Final   Candida tropicalis NOT DETECTED NOT DETECTED Final    Comment: Performed at [facility], [address], [place], [place] [address]  Culture, blood (routine x 2)     Status: None (Preliminary result)   Collection Time: 02/27/19  1:30 AM  Result Value Ref Range Status   Specimen Description BLOOD LEFT ANTECUBITAL  Final   Special Requests   Final    BOTTLES DRAWN AEROBIC AND ANAEROBIC Blood Culture results Boom not be optimal due to an inadequate volume of blood received in culture bottles   Culture   Final  NO GROWTH 1 DAY Performed at [facility], [place] [address] [place], [place] [address]    Report Status PENDING  Incomplete  Respiratory Panel by PCR     Status: Abnormal   Collection Time: 02/27/19  1:48 AM  Result Value Ref Range Status   Adenovirus NOT DETECTED NOT DETECTED Final   Coronavirus 229E NOT DETECTED NOT DETECTED Final    Comment: (NOTE) The Coronavirus on the Respiratory Panel, DOES NOT test for the novel  Coronavirus (2019 nCoV)    Coronavirus HKU1 NOT DETECTED NOT DETECTED Final   Coronavirus NL63 NOT DETECTED NOT DETECTED Final   Coronavirus OC43 NOT DETECTED NOT DETECTED Final   Metapneumovirus NOT DETECTED NOT DETECTED Final   Rhinovirus / Enterovirus DETECTED (A) NOT DETECTED Final   Influenza A NOT DETECTED NOT DETECTED Final   Influenza B NOT DETECTED NOT DETECTED Final   Parainfluenza Virus 1 NOT DETECTED NOT DETECTED Final   Parainfluenza Virus 2 NOT DETECTED NOT DETECTED Final   Parainfluenza Virus 3 NOT DETECTED NOT DETECTED Final   Parainfluenza Virus 4 NOT DETECTED NOT DETECTED Final   Respiratory Syncytial Virus NOT DETECTED NOT DETECTED Final   Bordetella pertussis NOT DETECTED NOT DETECTED Final    Chlamydophila pneumoniae NOT DETECTED NOT DETECTED Final   Mycoplasma pneumoniae NOT DETECTED NOT DETECTED Final    Comment: Performed at [facility], [place]. [address] [place], [place] [address]      Radiology Studies: No results found.   Scheduled Meds: . amiodarone  200 mg Oral Daily  . apixaban  5 mg Oral BID  . cholecalciferol  1,000 Units Oral Daily  . fluticasone furoate-vilanterol  1 puff Inhalation Daily  . gabapentin  300 mg Oral BID  . loratadine  10 mg Oral Daily  . methylPREDNISolone (SOLU-MEDROL) injection  40 mg Intravenous Q12H  . metoprolol tartrate  25 mg Oral BID  . pantoprazole  40 mg Oral QHS  . pravastatin  80 mg Oral Daily   Continuous Infusions:    LOS: 2 days   Time Spent in minutes   30 minutes  Eric [name] D.O. on 03/01/2019 at 9:56 AM  Between 7am to 7pm - Please see pager noted on [email or web address]  After 7pm go to [email or web address]  And look for the night coverage person covering for me after hours  [facility] Office  [phone] \

## 2019-03-01 NOTE — Telephone Encounter (Signed)
Returned call to patient's daughter. Pt would like to keep appt 03/11/19. Nothing further needed.

## 2019-03-02 LAB — BASIC METABOLIC PANEL
Anion gap: 9 (ref 5–15)
BUN: 25 mg/dL — ABNORMAL HIGH (ref 8–23)
CO2: 26 mmol/L (ref 22–32)
Calcium: 8.9 mg/dL (ref 8.9–10.3)
Chloride: 104 mmol/L (ref 98–111)
Creatinine, Ser: 1.31 mg/dL — ABNORMAL HIGH (ref 0.61–1.24)
GFR calc Af Amer: 58 mL/min — ABNORMAL LOW (ref >60)
GFR calc non Af Amer: 50 mL/min — ABNORMAL LOW (ref >60)
Glucose, Bld: 117 mg/dL — ABNORMAL HIGH (ref 70–99)
Potassium: 4.3 mmol/L (ref 3.5–5.1)
SODIUM: 139 mmol/L (ref 135–145)

## 2019-03-02 LAB — CBC
HCT: 37.7 % — ABNORMAL LOW (ref 39.0–52.0)
Hemoglobin: 12 g/dL — ABNORMAL LOW (ref 13.0–17.0)
MCH: 31.4 pg (ref 26.0–34.0)
MCHC: 31.8 g/dL (ref 30.0–36.0)
MCV: 98.7 fL (ref 80.0–100.0)
Platelets: 191 10*3/uL (ref 150–400)
RBC: 3.82 MIL/uL — AB (ref 4.22–5.81)
RDW: 12.7 % (ref 11.5–15.5)
WBC: 8.9 10*3/uL (ref 4.0–10.5)
nRBC: 0.2 % (ref 0.0–0.2)

## 2019-03-02 MED ORDER — PREDNISONE 20 MG PO TABS: 40.0000 mg | ORAL_TABLET | Freq: Every day | ORAL | 0 refills | Status: DC

## 2019-03-02 MED ORDER — PREDNISONE 20 MG PO TABS
40.0000 mg | ORAL_TABLET | Freq: Every day | ORAL | Status: DC
  Administered 2019-03-02: 40 mg via ORAL
  Filled 2019-03-02: qty 2

## 2019-03-02 MED ORDER — ALBUTEROL SULFATE (2.5 MG/3ML) 0.083% IN NEBU: 2.5000 mg | INHALATION_SOLUTION | Freq: Two times a day (BID) | RESPIRATORY_TRACT | 0 refills | Status: DC | PRN

## 2019-03-02 MED ORDER — POTASSIUM CHLORIDE CRYS ER 10 MEQ PO TBCR: 10.0000 meq | EXTENDED_RELEASE_TABLET | Freq: Every day | ORAL | Status: DC

## 2019-03-02 MED ORDER — FUROSEMIDE 40 MG PO TABS: 40.0000 mg | ORAL_TABLET | Freq: Every day | ORAL | Status: DC

## 2019-03-02 NOTE — Evaluation (Signed)
Physical Therapy Evaluation Patient Details Name: Eric [name] MRN: [id number] DOB: 1932-02-27 Today's Date: 03/02/2019   History of Present Illness  Moataz Tavis Cafarella is a 85 y.o. male with medical history significant of hypertension, hyperlipidemia, GERD, depression, anxiety, sCHF, DVT, A. fib on Eliquis, CKD-3, who presents with fever, chills, cough and shortness of breath.    Clinical Impression  Pt admitted with above diagnosis. Pt currently with functional limitations due to the deficits listed below (see PT Problem List). PTA, pt mod I with mobility utilizing RW for gait. Today weaker than baseline ambulating short distances on RA Spo2 to 90%. Pt lives with wife and has been working with [place], continue to rec HHPT.  Pt will benefit from skilled PT to increase their independence and safety with mobility to allow discharge to the venue listed below.       Follow Up Recommendations Home health PT;Supervision/Assistance - 24 hour    Equipment Recommendations  None recommended by PT    Recommendations for Other Services       Precautions / Restrictions Precautions Precautions: Fall Restrictions Weight Bearing Restrictions: No      Mobility  Bed Mobility Overal bed mobility: Modified Independent                Transfers Overall transfer level: Needs assistance Equipment used: Rolling walker (2 wheeled) Transfers: Sit to/from Stand Sit to Stand: Supervision            Ambulation/Gait Ambulation/Gait assistance: Supervision Gait Distance (Feet): 40 Feet Assistive device: Rolling walker (2 wheeled) Gait Pattern/deviations: Step-to pattern Gait velocity: decreased   General Gait Details: ambulating short distances on RA with RW, no over tbalance, mild unsteadiness, VSS on RA  Stairs            Wheelchair Mobility    Modified Rankin (Stroke Patients Only)       Balance                                             Pertinent Vitals/Pain  Pain Assessment: No/denies pain    Home Living Family/patient expects to be discharged to:: Private residence Living Arrangements: Spouse/significant other Available Help at Discharge: Family Type of Home: House Home Access: Ramped entrance     Home Layout: One level Home Equipment: None      Prior Function Level of Independence: Independent with assistive device(s)         Comments: mod I with mobility with RW     Hand Dominance        Extremity/Trunk Assessment   Upper Extremity Assessment Upper Extremity Assessment: Overall WFL for tasks assessed    Lower Extremity Assessment Lower Extremity Assessment: Overall WFL for tasks assessed       Communication      Cognition Arousal/Alertness: Awake/alert Behavior During Therapy: WFL for tasks assessed/performed Overall Cognitive Status: Within Functional Limits for tasks assessed                                        General Comments      Exercises     Assessment/Plan    PT Assessment Patient needs continued PT services  PT Problem List Decreased strength       PT Treatment Interventions DME instruction;Gait training;Stair training;Therapeutic exercise;Therapeutic  activities;Functional mobility training    PT Goals (Current goals can be found in the Care Plan section)  Acute Rehab PT Goals Patient Stated Goal: get home  PT Goal Formulation: With patient Time For Goal Achievement: 03/16/19 Potential to Achieve Goals: Good    Frequency Min 3X/week   Barriers to discharge Decreased caregiver support      Co-evaluation               AM-PAC PT "6 Clicks" Mobility  Outcome Measure Help needed turning from your back to your side while in a flat bed without using bedrails?: None Help needed moving from lying on your back to sitting on the side of a flat bed without using bedrails?: None Help needed moving to and from a bed to a chair (including a wheelchair)?: A Little Help  needed standing up from a chair using your arms (e.g., wheelchair or bedside chair)?: A Little Help needed to walk in hospital room?: A Little Help needed climbing 3-5 steps with a railing? : A Lot 6 Click Score: 19    End of Session Equipment Utilized During Treatment: Gait belt Activity Tolerance: Patient tolerated treatment well Patient left: in bed;with call bell/phone within reach Nurse Communication: Mobility status PT Visit Diagnosis: Unsteadiness on feet (R26.81)    Time: 8280-0349 PT Time Calculation (min) (ACUTE ONLY): 30 min   Charges:   PT Evaluation $PT Eval Low Complexity: 1 Low PT Treatments $Gait Training: 8-22 mins        Reinaldo Berber, PT, DPT Acute Rehabilitation Services Pager: [phone] Office: [place] 03/02/2019, 11:05 AM

## 2019-03-02 NOTE — Care Management (Signed)
Spoke to patient at bedside who states that he has home oxygen w [employer]. Notified rep that he will have new orders for continuous oxygen as he currently states he only wears it at night. He states that his daughter will be his transportation home and she will have tank for transport home. He states that he is active w [facility] for [facility]. He would like to continue services. Notified rep that he will DC today with orders for PT OT RN [facility].

## 2019-03-02 NOTE — Discharge Summary (Addendum)
Discharge Summary  Eric [name] MRN:[id number] DOB: August 17, 1932  PCP: [name], DO  Admit date: 02/27/2019 Discharge date: 03/02/2019  Time spent: 35 minutes  Recommendations for Outpatient Follow-up:  1. Follow up with your PCP 2. Take your medications as prescribed 3. Fall precautions   Discharge Diagnoses:  Active Hospital Problems   Diagnosis Date Noted   Bronchitis 02/27/2019   Acute on chronic respiratory failure with hypoxia (HCC) 02/27/2019   DVT (deep venous thrombosis) ([place]) 02/27/2019   CKD (chronic kidney disease), stage III ([place]) 02/27/2019   Sepsis ([place]) 02/27/2019   Abnormal LFTs 02/27/2019   Rhinovirus infection 02/27/2019   Chronic diastolic CHF (congestive heart failure) ([place]) 01/19/2015   Hypertension    PAF (paroxysmal atrial fibrillation) ([place])    Long term current use of anticoagulant 03/02/2011   Hyperlipidemia 05/28/2007    Resolved Hospital Problems  No resolved problems to display.    Discharge Condition: Stable  Diet recommendation: Resume previous diet  Vitals:   03/02/19 0733 03/02/19 0758  BP: (!) 146/76   Pulse: (!) 57 (!) 57  Resp:  18  Temp: (!) 97.5 F (36.4 C)   SpO2: 93%     History of present illness:  Eric [name] a 85 y.o.malewith medical history significant ofhypertension, hyperlipidemia, GERD, depression, anxiety,sCHF, DVT, A. fib on Eliquis, CKD-3, who presents with fever, chills, cough and shortness of breath.  Patient states that he has been having shortness breath in the past several days, which has worsened tonight. He also has dry cough, fever and chilsand sneezing. Patient also reports lower chest pain, which is intermittent, moderate, sharp, pleuritic, aggravated by deep breaths. He states that he has chronic mild intermittent diarrhea, which has not changed. Currently no nausea vomiting, abdominal pain. Denies symptoms of UTI or unilateral weakness. Patient denies sick contact. No recent  traveling. Patient states that he eat outside in restaurants on Sunday. Per EDP, EMS reported that pt had wheezing earlier.  Admitted for sepsis and acute on chronic hypoxic respiratory failure secondary to bronchitis and rhinovirus infection.  03/02/19: Patient seen and examined at his bedside.  No acute events overnight.  States his breathing is improved.  He has no new complaints.  He denies chest pain.  On the day of discharge, the patient was hemodynamically stable.  He will need to follow-up with his primary care provider and cardiology posthospitalization.     Hospital Course:  Principal Problem:   Bronchitis Active Problems:   Hyperlipidemia   Long term current use of anticoagulant   PAF (paroxysmal atrial fibrillation) (HCC)   Hypertension   Chronic diastolic CHF (congestive heart failure) (HCC)   Acute on chronic respiratory failure with hypoxia (HCC)   DVT (deep venous thrombosis) (HCC)   CKD (chronic kidney disease), stage III (HCC)   Sepsis (HCC)   Abnormal LFTs   Rhinovirus infection   Sepsis/acute on chronic respiratory failure with hypoxia secondary to bronchitis and rhinovirus infection -Patient presented with leukocytosis, fever, tachycardia, tachypnea with an elevated lactic acid  -sepsis morphology improving, lactic acid improved (5.4 --> 1.6) -Chest x-ray unremarkable for lobular infiltrates -Influenza PCR negative -Respiratory viral panel positive for rhinovirus -Patient was placed on azithromycin and ceftriaxone given possibility of early onset pneumonia- antibiotics discontinued  -Blood cultures 1/4 +GPC (likely contaminant) -procalcitonin unremarkable -Continue normal saline, nebulizer treatments, Breo inhaler, Mucinex -Urine strep pneumonia antigen negative -was requiring 3L of oxygen, however weaning -Continue prednisone 40 mg daily x5 days -Home O2 evaluation prior  to discharge -Maintain O2 saturation currently 92% -Follow-up with PCP  outpatient  Chronic nocturnal hypoxia On 2 L of oxygen at night Home O2 evaluation prior to discharge Maintain O2 saturation greater than 92% DME oxygen and oximeter  Paroxysmal atrial fibrillation -CHADSVASC 4 -Continue Eliquis -Continue metoprolol, amiodarone -Follow-up with cardiology  Essential hypertension -Continue metoprolol, IV hydralazine as needed -Resume home dose of Lasix  Chronic diastolic heart failure -Echocardiogram 10/20/2018 shows an EF of 60 to 42%, grade 2 diastolic dysfunction -Chest x-ray unremarkable for pulmonary edema -Appears to be compensated and stable -Continue current medications including Lasix -Follow-up with cardiology outpatient  Chronic kidney disease, stage III -Creatinine at baseline 1.31 with GFR 50 -Continue to avoid nephrotoxic agents -Follow-up with your PCP and repeat BMP in 3 days  Mildly elevated LFTs with possible fatty liver on abd [place] 03/01/19 -Suspect secondary to sepsis -Hepatitis panel unremarkable  -Repeat CMP outpatient in 3 days  Hyperlipidemia -Resume statin  History of DVT  -Continue Eliquis  Ambulatory dysfunction/physical debility Home with home health PT  PPE worn: surgical mask, gown, gloves  DVT Prophylaxis  Eliquis  Code Status: Full  Family Communication: None at bedside   Consultants none  Procedures  none  Antibiotics              Anti-infectives (From admission, onward)   Start     Dose/Rate Route Frequency Ordered Stop   02/28/19 0800  azithromycin (ZITHROMAX) 500 mg in sodium chloride 0.9 % 250 mL IVPB  Status:  Discontinued     500 mg 250 mL/hr over 60 Minutes Intravenous Every 24 hours 02/27/19 0540 02/28/19 1215   02/28/19 0800  cefTRIAXone (ROCEPHIN) 1 g in sodium chloride 0.9 % 100 mL IVPB  Status:  Discontinued     1 g 200 mL/hr over 30 Minutes Intravenous Every 24 hours 02/27/19 0540 02/28/19 1215   02/27/19 0300  cefTRIAXone (ROCEPHIN) 1 g in sodium  chloride 0.9 % 100 mL IVPB     1 g 200 mL/hr over 30 Minutes Intravenous  Once 02/27/19 0248 02/27/19 0530   02/27/19 0300  azithromycin (ZITHROMAX) 500 mg in sodium chloride 0.9 % 250 mL IVPB     500 mg 250 mL/hr over 60 Minutes Intravenous  Once 02/27/19 0248 02/27/19 0533       Discharge Exam: BP (!) 146/76 (BP Location: Left Arm)    Pulse (!) 57    Temp (!) 97.5 F (36.4 C) (Oral)    Resp 18    Ht 5\' 6"  (1.676 m)    Wt 86.2 kg    SpO2 93%    BMI 30.67 kg/m   General: 85 y.o. year-old male well developed well nourished in no acute distress.  Alert and oriented x3.  Cardiovascular: Regular rate and rhythm with no rubs or gallops.  No thyromegaly or JVD noted.    Respiratory: Clear to auscultation with no wheezes or rales. Good inspiratory effort.  Abdomen: Soft nontender nondistended with normal bowel sounds x4 quadrants.  Musculoskeletal: Trace lower extremity edema. 2/4 pulses in all 4 extremities.  Psychiatry: Mood is appropriate for condition and setting  Discharge Instructions You were cared for by a hospitalist during your hospital stay. If you have any questions about your discharge medications or the care you received while you were in the hospital after you are discharged, you can call the unit and asked to speak with the hospitalist on call if the hospitalist that took care of you is not available.  Once you are discharged, your primary care physician will handle any further medical issues. Please note that NO REFILLS for any discharge medications will be authorized once you are discharged, as it is imperative that you return to your primary care physician (or establish a relationship with a primary care physician if you do not have one) for your aftercare needs so that they can reassess your need for medications and monitor your lab values.   Allergies as of 03/02/2019      Reactions   Morphine And Related Shortness Of Breath   Percocet [oxycodone-acetaminophen]  Shortness Of Breath   Valium Shortness Of Breath      Medication List    TAKE these medications   albuterol (2.5 MG/3ML) 0.083% nebulizer solution Commonly known as:  PROVENTIL Take 3 mLs (2.5 mg total) by nebulization 2 (two) times daily as needed for wheezing or shortness of breath.   amiodarone 200 MG tablet Commonly known as:  PACERONE TAKE 1 TABLET BY MOUTH EVERY DAY   apixaban 5 MG Tabs tablet Commonly known as:  Eliquis Take 1 tablet (5 mg total) by mouth 2 (two) times daily.   Breo Ellipta 100-25 MCG/INH Aepb Generic drug:  fluticasone furoate-vilanterol Inhale 1 puff into the lungs daily.   cetirizine 10 MG tablet Commonly known as:  ZYRTEC TAKE 1 TABLET BY MOUTH EVERY DAY   fluticasone 50 MCG/ACT nasal spray Commonly known as:  Flonase Place 2 sprays daily into both nostrils.   furosemide 40 MG tablet Commonly known as:  LASIX Take 1 tablet (40 mg total) by mouth daily.   gabapentin 300 MG capsule Commonly known as:  NEURONTIN Take 1 capsule (300 mg total) by mouth 2 (two) times daily.   metoprolol tartrate 25 MG tablet Commonly known as:  LOPRESSOR Take 1 tablet (25 mg total) by mouth 2 (two) times daily.   pantoprazole 40 MG tablet Commonly known as:  PROTONIX Take 1 tablet (40 mg total) by mouth at bedtime.   potassium chloride 10 MEQ tablet Commonly known as:  K-DUR,KLOR-CON Take 1 tablet (10 mEq total) by mouth daily.   pravastatin 80 MG tablet Commonly known as:  PRAVACHOL Take 1 tablet (80 mg total) by mouth daily.   predniSONE 20 MG tablet Commonly known as:  DELTASONE Take 2 tablets (40 mg total) by mouth daily with breakfast for 5 days.   traMADol 50 MG tablet Commonly known as:  ULTRAM Take 1 tablet (50 mg total) by mouth every 6 (six) hours as needed.   Vitamin D 1000 units capsule Take one tablet once daily            Durable Medical Equipment  (From admission, onward)         Start     Ordered   03/02/19 1145  For  home use only DME Nebulizer/meds  Once    Comments:  Imminent dc  Question:  Patient needs a nebulizer to treat with the following condition  Answer:  Hypoxemia   03/02/19 1145   03/02/19 1020  For home use only DME oxygen  Once    Question Answer Comment  Mode or (Route) Nasal cannula   Liters per Minute 2   Frequency Only at night (stationary unit needed)   Oxygen conserving device Yes   Oxygen delivery system Gas      03/02/19 1019   03/02/19 1015  For home use only DME Pulse oximeter  Once     03/02/19 1015  Allergies  Allergen Reactions   Morphine And Related Shortness Of Breath   Percocet [Oxycodone-Acetaminophen] Shortness Of Breath   Valium Shortness Of Breath   Follow-up Information    Reed, [name] L, DO. Call in 1 day(s).   Specialty:  Geriatric Medicine Why:  Please call for a post hospital follow-up appointment. Contact information: [address] [place] [address] [phone]        [name], MD .   Specialty:  Cardiology Contact information: [place] [address] [phone]            The results of significant diagnostics from this hospitalization (including imaging, microbiology, ancillary and laboratory) are listed below for reference.    Significant Diagnostic Studies: Mr Brain Wo Contrast  Result Date: 02/03/2019  [facility] [address] [place], [place] [address] [phone] NEUROIMAGING REPORT STUDY DATE: 02/02/2019 PATIENT NAME: Moyses Pavey Copus DOB: [birth year] MRN: [id number] ORDERING CLINICIAN: Dr Krista Blue CLINICAL HISTORY:  44 year patient with gait abnormality COMPARISON FILMS: CT Head 10/19/2017 EXAM: MRI Brain wo TECHNIQUE: MRI of the brain without contrast was obtained utilizing 5 mm axial slices with T1, T2, T2 flair, T2 star gradient echo and diffusion weighted views.  T1 sagittal and T2 coronal views were obtained.  The study is slightly compromised by motion  artifacts cONTRAST: none IMAGING SITE: [facility] FINDINGS: The brain parenchyma shows scattered supratentorial periventricular and subcortical white matter hyperintensities on T2/flair which are compatible with age-related changes of chronic microvascular ischemia.  There is also proportionate generalized cerebral cortical atrophy noted.  There are mild changes of chronic paranasal sinus inflammation.  No structural lesion, tumor or infarcts are noted.No abnormal lesions are seen on diffusion-weighted views to suggest acute ischemia. The cortical sulci, fissures and cisterns are normal in size and appearance. Lateral, third and fourth ventricle are normal in size and appearance. No extra-axial fluid collections are seen. No evidence of mass effect or midline shift.  On sagittal views the posterior fossa, pituitary gland and corpus callosum are unremarkable. No evidence of intracranial hemorrhage on gradient-echo views. The orbits and their contents, paranasal sinuses and calvarium are unremarkable.  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   Abnormal MRI scan of the brain showing mild age-appropriate changes of chronic microvascular ischemia and generalized cerebral atrophy. INTERPRETING PHYSICIAN: Antony Contras, MD Certified in  Neuroimaging by [place] [facility] and [facility] [place] for Neurological Subspecialities   Mr Cervical Spine Wo Contrast  Result Date: 02/03/2019  [facility] [address] [place], [place] [address] [phone] NEUROIMAGING REPORT STUDY DATE: 02/02/2019 PATIENT NAME: Ahmir Bracken Capuano DOB: 02-22-1932 MRN: [id number] ORDERING CLINICIAN: Dr Krista Blue CLINICAL HISTORY: 59 year patient with gait abnormality COMPARISON FILMS: CT Cervical Spine 10/19/2017 EXAM: MRI Cervical Spine wo TECHNIQUE: MRI of the cervical spine was obtained utilizing 3  mm sagittal slices from the posterior fossa down to the T3-4 level with T1, T2 and inversion recovery views. In addition 4 mm axial slices from B3-4 down to T1-2 level were included with T2 and gradient echo views. CONTRAST: none IMAGING SITE: [facility] FINDINGS: The cervical vertebrae demonstrate abnormal alignment with mild anterior listhesis of C4-5 and postoperative changes of posterior cervical fusion from C2-T1 along with changes of posterior decompression from L9-F7 with metallic artifacts.  C2-3 shows mild disc signal abnormality  but without any significant compression.  C3-C6 show changes of posterior decompression with loss of disc height and but adequate canal dimensions with metallic artifacts obscuring evaluation.  There is stable changes of posterior cervical fusion from C2-T1.  There are minor posterior osteophytes at C5-6 C6-7 and C7-T1 as well as T1- T2 but there is no significant compression of the canal or foramina.  The visualized paraspinal soft tissues show appropriate postoperative changes.  The craniovertebral junction shows mild degenerative changes but no significant compression.   Abnormal MRI scan cervical spine showing stable postoperative changes of posterior cervical fusion from C2-T1 along with posterior decompression from C3-C6 with minor disc signal abnormalities but no significant compression. INTERPRETING PHYSICIAN: Antony Contras, MD Certified in  Neuroimaging by [place] [facility] and [place] for Neurological Subspecialities   Mr Lumbar Spine Wo Contrast  Result Date: 02/03/2019  [facility] [address] [place], [place] [address] [phone] NEUROIMAGING REPORT STUDY DATE: 02/02/2019 PATIENT NAME: Haydyn Girvan Eskridge DOB: 1932-05-25 MRN: [id number] ORDERING CLINICIAN: Dr Krista Blue CLINICAL HISTORY:  72 year patient with gait abnormality COMPARISON FILMS:MRI Lumbar Spine 06/21/2016 EXAM: MRI Lumbar Spine wo TECHNIQUE: MRI of the lumbar  spine was obtained utilizing 4 mm sagittal slices from O29-47 down to the lower sacrum with T1, T2 and inversion recovery views. In addition 4 mm axial slices from M5-4 down to L5-S1 level were included with T1 and T2 weighted views. CONTRAST: none IMAGING SITE: [facility] FINDINGS: The lumbar vertebrae demonstrate abnormal alignment with anterior listhesis of L1 over L2 with normal lumbar body heights but abnormal marrow signal at L1-2, L2-3, L4-5 there is loss of disc height and mild anterior and posterior osteophytes. There are prominent disc desiccation changes noted throughout.  There is small vestigial disc at S1-S2.  At L1-L2 there is vacuum disc with endplate degenerative changes with anterior and posterior osteophytes and prominent facet hypertrophy resulting in mild bilateral foraminal narrowing but no definite nerve root impingement. L2-3 shows marked disc space narrowing with mild retrolisthesis and broad-based disc osteophyte complex protruding into the neuralforamina resulting in severe bilateral neural foraminal narrowing and mild spinal narrowing with prominent facet hypertrophy and mild ligamentum flavum hypertrophy as well. L3-4 also shows loss of disc height as well as broad-based disc osteophyte protrusion along with ligamentum flavum hypertrophy and prominent epidural fat resulting in severe right greater than left foraminal narrowing and moderate canal narrowing as well.  L4-5 also shows disc desiccation change as well as prominent osteophytes and facet hypertrophy as well as ligamentum flavum hypertrophy resulting in severe right greater than left foraminal narrowing and moderate canal narrowing with possible encroachment on exiting nerve roots. L5-S1 also shows similar changes of disc desiccation as well as prominent osteophyte protrusion along with ligamentum flavum as well as facet hypertrophy resulting in severe right greater than left foraminal narrowing as well as severe canal  narrowing as well and possible encroachment on the exiting nerve roots. The conus at terminates at L1.  The paraspinal soft tissue appear unremarkable.  The visualized portion lower thoracic vertebrae demonstrate mild degenerative changes as well. INTERPRETING PHYSICIAN: Abnormal MRI scan of the lumbar spine showing severe disc degenerative changes throughout as well as prominent facet and ligamentum flavum hypertrophy resulting in severe spinal stenosis at L4-5 and L5-S1 with likely bilateral exiting nerve root entrapment.  There also bilateral sever foraminal narrowing at L2-3 and L3-4 as well.  These changes appear to have progressed slightly compared with previous  MRI from 06/21/2016 Antony Contras, MD Certified in  Neuroimaging by [place] of Neuroimaging and [place] for Neurological Subspecialities   US Abdomen Complete  Result Date: 03/01/2019 CLINICAL DATA:  Elevated LFTs. EXAM: ABDOMEN ULTRASOUND COMPLETE COMPARISON:  CT 10/12/2018. FINDINGS: Gallbladder: Cholecystectomy. Common bile duct: Diameter: 3.3 mm Liver: Increased echogenicity consistent fatty infiltration and/or hepatocellular disease. Limited visualization of the left hepatic lobe. No definite mass noted. Portal vein appears patent with normal direction of flow. IVC: No abnormality visualized. Pancreas: Visualized portion unremarkable. Spleen: Size and appearance within normal limits. Right Kidney: Length: 10.9 cm. Echogenicity within normal limits. No mass or hydronephrosis visualized. Left Kidney: Length: 11.2 cm. Echogenicity within normal limits. No mass or hydronephrosis visualized. Abdominal aorta: No aneurysm visualized. Other findings: None. IMPRESSION: 1.  Cholecystectomy.  No biliary distention. 2. Liver is echogenic consistent fatty infiltration or hepatocellular disease. Left hepatic lobe is poorly visualized. No focal hepatic abnormality identified. Portal vein is patent with normal direction of flow. Electronically  Signed   By: Marcello Moores  Register   On: 03/01/2019 15:01   Dg Chest Portable 1 View  Result Date: 02/27/2019 CLINICAL DATA:  Cough with fever.  Short of breath. EXAM: PORTABLE CHEST 1 VIEW COMPARISON:  10/16/2018 FINDINGS: Cardiac silhouette borderline enlarged. No mediastinal or hilar masses or convincing adenopathy. Clear lungs.  No pleural effusion or pneumothorax. Stable changes from a previous posterior cervical spine fusion. Skeletal structures are demineralized but grossly intact. IMPRESSION: No active disease. Electronically Signed   By: Lajean Manes M.D.   On: 02/27/2019 02:29    Microbiology: Recent Results (from the past 240 hour(s))  Culture, blood (routine x 2)     Status: Abnormal   Collection Time: 02/27/19  1:20 AM  Result Value Ref Range Status   Specimen Description BLOOD RIGHT FOREARM  Final   Special Requests   Final    BOTTLES DRAWN AEROBIC AND ANAEROBIC Blood Culture results [place]acy not be optimal due to an inadequate volume of blood received in culture bottles   Culture  Setup Time   Final    GRAM POSITIVE COCCI ANAEROBIC BOTTLE ONLY CRITICAL RESULT CALLED TO, READ BACK BY AND VERIFIED WITH: K AMEND PHARMD 2228 02/27/19 A BROWNING    Culture (A)  Final    STAPHYLOCOCCUS SPECIES (COAGULASE NEGATIVE) THE SIGNIFICANCE OF ISOLATING THIS ORGANISM FROM A SINGLE SET OF BLOOD CULTURES WHEN MULTIPLE SETS ARE DRAWN IS UNCERTAIN. PLEASE NOTIFY THE MICROBIOLOGY DEPARTMENT WITHIN ONE WEEK IF SPECIATION AND SENSITIVITIES ARE REQUIRED. Performed at [facility], [place] [address] [place], [place] [address]    Report Status 03/01/2019 FINAL  Final  Blood Culture ID Panel (Reflexed)     Status: Abnormal   Collection Time: 02/27/19  1:20 AM  Result Value Ref Range Status   Enterococcus species NOT DETECTED NOT DETECTED Final   Listeria monocytogenes NOT DETECTED NOT DETECTED Final   Staphylococcus species DETECTED (A) NOT DETECTED Final    Comment: Methicillin (oxacillin) susceptible  coagulase negative staphylococcus. Possible blood culture contaminant (unless isolated from more than one blood culture draw or clinical case suggests pathogenicity). No antibiotic treatment is indicated for blood  culture contaminants. CRITICAL RESULT CALLED TO, READ BACK BY AND VERIFIED WITH: K AMEND PHARMD 2228 02/27/19 A BROWNING    Staphylococcus aureus (BCID) NOT DETECTED NOT DETECTED Final   Methicillin resistance NOT DETECTED NOT DETECTED Final   Streptococcus species NOT DETECTED NOT DETECTED Final   Streptococcus agalactiae NOT DETECTED NOT DETECTED Final  Streptococcus pneumoniae NOT DETECTED NOT DETECTED Final   Streptococcus pyogenes NOT DETECTED NOT DETECTED Final   Acinetobacter baumannii NOT DETECTED NOT DETECTED Final   Enterobacteriaceae species NOT DETECTED NOT DETECTED Final   Enterobacter cloacae complex NOT DETECTED NOT DETECTED Final   Escherichia coli NOT DETECTED NOT DETECTED Final   Klebsiella oxytoca NOT DETECTED NOT DETECTED Final   Klebsiella pneumoniae NOT DETECTED NOT DETECTED Final   Proteus species NOT DETECTED NOT DETECTED Final   Serratia marcescens NOT DETECTED NOT DETECTED Final   Haemophilus influenzae NOT DETECTED NOT DETECTED Final   Neisseria meningitidis NOT DETECTED NOT DETECTED Final   Pseudomonas aeruginosa NOT DETECTED NOT DETECTED Final   Candida albicans NOT DETECTED NOT DETECTED Final   Candida glabrata NOT DETECTED NOT DETECTED Final   Candida krusei NOT DETECTED NOT DETECTED Final   Candida parapsilosis NOT DETECTED NOT DETECTED Final   Candida tropicalis NOT DETECTED NOT DETECTED Final    Comment: Performed at [facility], [place] [address] [place], [place] [address]  Culture, blood (routine x 2)     Status: None (Preliminary result)   Collection Time: 02/27/19  1:30 AM  Result Value Ref Range Status   Specimen Description BLOOD LEFT ANTECUBITAL  Final   Special Requests   Final    BOTTLES DRAWN AEROBIC AND ANAEROBIC Blood  Culture results [place] not be optimal due to an inadequate volume of blood received in culture bottles   Culture   Final    NO GROWTH 3 DAYS Performed at [facility], [place] [address], [place], [place] [address]    Report Status PENDING  Incomplete  Respiratory Panel by PCR     Status: Abnormal   Collection Time: 02/27/19  1:48 AM  Result Value Ref Range Status   Adenovirus NOT DETECTED NOT DETECTED Final   Coronavirus 229E NOT DETECTED NOT DETECTED Final    Comment: (NOTE) The Coronavirus on the Respiratory Panel, DOES NOT test for the novel  Coronavirus (2019 nCoV)    Coronavirus HKU1 NOT DETECTED NOT DETECTED Final   Coronavirus NL63 NOT DETECTED NOT DETECTED Final   Coronavirus OC43 NOT DETECTED NOT DETECTED Final   Metapneumovirus NOT DETECTED NOT DETECTED Final   Rhinovirus / Enterovirus DETECTED (A) NOT DETECTED Final   Influenza A NOT DETECTED NOT DETECTED Final   Influenza B NOT DETECTED NOT DETECTED Final   Parainfluenza Virus 1 NOT DETECTED NOT DETECTED Final   Parainfluenza Virus 2 NOT DETECTED NOT DETECTED Final   Parainfluenza Virus 3 NOT DETECTED NOT DETECTED Final   Parainfluenza Virus 4 NOT DETECTED NOT DETECTED Final   Respiratory Syncytial Virus NOT DETECTED NOT DETECTED Final   Bordetella pertussis NOT DETECTED NOT DETECTED Final   Chlamydophila pneumoniae NOT DETECTED NOT DETECTED Final   Mycoplasma pneumoniae NOT DETECTED NOT DETECTED Final    Comment: Performed at [facility], [place]. [address] [place], [place] [address]     Labs: Basic Metabolic Panel: Recent Labs  Lab 02/27/19 0145 02/28/19 0059 03/01/19 0241 03/02/19 0222  NA 139 141 139 139  K 4.5 4.2 4.3 4.3  CL 104 112* 106 104  CO2 24 22 28 26   GLUCOSE 103* 121* 111* 117*  BUN 18 20 22  25*  CREATININE 1.46* 1.20 1.30* 1.31*  CALCIUM 9.2 8.2* 8.6* 8.9   Liver Function Tests: Recent Labs  Lab 02/27/19 0145 03/01/19 0241  AST 87* 86*  ALT 61* 67*  ALKPHOS 58 42  BILITOT 1.2  0.8  PROT 7.4 6.5  ALBUMIN 4.3 3.5   No results for input(s): LIPASE, AMYLASE in the last 168 hours. No results for input(s): AMMONIA in the last 168 hours. CBC: Recent Labs  Lab 02/27/19 0145 02/28/19 0059 03/01/19 0241 03/02/19 0222  WBC 13.0* 11.2* 8.3 8.9  NEUTROABS 7.0  --   --   --   HGB 15.2 12.0* 12.8* 12.0*  HCT 47.4 38.2* 39.7 37.7*  MCV 100.0 100.0 100.5* 98.7  PLT 204 170 193 191   Cardiac Enzymes: No results for input(s): CKTOTAL, CKMB, CKMBINDEX, TROPONINI in the last 168 hours. BNP: BNP (last 3 results) Recent Labs    02/27/19 0147  BNP 71.3    ProBNP (last 3 results) No results for input(s): PROBNP in the last 8760 hours.  CBG: No results for input(s): GLUCAP in the last 168 hours.     Signed:  Kayleen Memos, MD [facility] 03/02/2019, 11:46 AM

## 2019-03-02 NOTE — Plan of Care (Signed)

## 2019-03-04 ENCOUNTER — Telehealth: Payer: Self-pay | Admitting: Cardiovascular Disease

## 2019-03-04 LAB — CULTURE, BLOOD (ROUTINE X 2): Culture: NO GROWTH

## 2019-03-04 NOTE — Telephone Encounter (Signed)
Ok to schedule for an evisit

## 2019-03-04 NOTE — Telephone Encounter (Signed)
Patient discharged from [facility] on 03/02/19. To Dr. Rockey Situ to review and advise on follow up appointment.    The patient was admitted with non-cardiac issues.

## 2019-03-04 NOTE — Telephone Encounter (Signed)
Patient daughter requesting [place] fu   Please advise

## 2019-03-05 ENCOUNTER — Telehealth: Payer: Self-pay

## 2019-03-05 NOTE — Telephone Encounter (Signed)
Transition Care Management Follow-up Telephone Call  Date of discharge and from where: Saturday 03/02/2019, [facility]   How have you been since you were released from the hospital? Better still with wheezing   Any questions or concerns? No   Items Reviewed:  Did the pt receive and understand the discharge instructions provided? Yes  Medications obtained and verified? Yes  Any new allergies since your discharge? no  Dietary orders reviewed? Yes  Do you have support at home? Yes, Son, daughter, and wife  Other (ie: DME, [place], etc) [place], [facility]. Patient using walker, cane on hand if needed.  Functional Questionnaire: (I = Independent and D = Dependent) ADL's: I with DME  Bathing/Dressing- I    Meal Prep- Wife provides meals  Eating- I   Maintaining continence- I, wears depends   Transferring/Ambulation- I, with walker  Managing Meds- I   Follow up appointments reviewed:    PCP Hospital f/u appt confirmed? Yes Scheduled to see Dr.Reed on 03/07/2019 @ 2:30 pm.  [facility] f/u appt confirmed? Yes, Scheduled to see pulmonary specialist on 3/30/202 @ 10:15 am  Are transportation arrangements needed? No  If their condition worsens, is the pt aware to call  their PCP or go to the ED? Yes  Was the patient provided with contact information for the PCP's office or ED? Yes  Was the pt encouraged to call back with questions or concerns? Yes

## 2019-03-06 ENCOUNTER — Telehealth: Payer: Self-pay

## 2019-03-06 NOTE — Telephone Encounter (Signed)
Spoke to daughter in regards to apt Monday. She states he just got out of the hospital Saturday and she is going to check and see if he needs his apt. She will let us know if they want to keep it.

## 2019-03-07 ENCOUNTER — Other Ambulatory Visit: Payer: Self-pay

## 2019-03-07 ENCOUNTER — Ambulatory Visit (INDEPENDENT_AMBULATORY_CARE_PROVIDER_SITE_OTHER): Payer: PPO | Admitting: Internal Medicine

## 2019-03-07 ENCOUNTER — Encounter: Payer: Self-pay | Admitting: Internal Medicine

## 2019-03-07 DIAGNOSIS — R740 Nonspecific elevation of levels of transaminase and lactic acid dehydrogenase [LDH]: Secondary | ICD-10-CM

## 2019-03-07 DIAGNOSIS — I480 Paroxysmal atrial fibrillation: Secondary | ICD-10-CM

## 2019-03-07 DIAGNOSIS — I5032 Chronic diastolic (congestive) heart failure: Secondary | ICD-10-CM

## 2019-03-07 DIAGNOSIS — J9601 Acute respiratory failure with hypoxia: Secondary | ICD-10-CM

## 2019-03-07 DIAGNOSIS — K760 Fatty (change of) liver, not elsewhere classified: Secondary | ICD-10-CM

## 2019-03-07 DIAGNOSIS — J9611 Chronic respiratory failure with hypoxia: Secondary | ICD-10-CM

## 2019-03-07 DIAGNOSIS — R6520 Severe sepsis without septic shock: Secondary | ICD-10-CM | POA: Diagnosis not present

## 2019-03-07 DIAGNOSIS — B348 Other viral infections of unspecified site: Secondary | ICD-10-CM | POA: Diagnosis not present

## 2019-03-07 DIAGNOSIS — R7401 Elevation of levels of liver transaminase levels: Secondary | ICD-10-CM

## 2019-03-07 DIAGNOSIS — R091 Pleurisy: Secondary | ICD-10-CM | POA: Diagnosis not present

## 2019-03-07 DIAGNOSIS — A419 Sepsis, unspecified organism: Secondary | ICD-10-CM

## 2019-03-07 DIAGNOSIS — J181 Lobar pneumonia, unspecified organism: Secondary | ICD-10-CM | POA: Diagnosis not present

## 2019-03-07 MED ORDER — DIPHENHYDRAMINE-PHENYLEPHRINE 6.25-2.5 MG/5ML PO LIQD: 5.0000 mL | Freq: Every evening | ORAL | 1 refills | Status: DC | PRN

## 2019-03-07 NOTE — Progress Notes (Signed)
This service is provided via telemedicine  No vital signs collected/recorded due to the encounter was a telemedicine visit.   Location of patient (ex: home, work):  HOME  Patient consents to a telephone visit:  YES  Location of the provider (ex: office, home):  OFFICE  Name of any referring provider:  Felcia Huebert, DO  Names of all persons participating in the telemedicine service and their role in the encounter:  Edwin Dada, CMA, PATIENT Gaylin Sharman, Dr. Mariea Clonts  Time spent on call:  6:03    Provider: Kriston Mckinnie L. Mariea Clonts, D.O., C.M.D.  Goals of Care:  Advanced Directives 02/27/2019  Does Patient Have a Medical Advance Directive? No  Type of Advance Directive -  Does patient want to make changes to medical advance directive? -  Copy of [place] in Chart? -  Would patient like information on creating a medical advance directive? No - Patient declined     Chief Complaint  Patient presents with  . Telephone Assessment    transitions of care visit    HPI: Patient is a 85 y.o. Eric [name] spoken with today by phone for hospital follow-up s/p admission from 3/18-3/21/20.  He went out with fever, chills, dry cough, sob, lower chest pain (pleuritic, intermittent, moderate to sharp), wheezing and his usual intermittent diarrhea.  His lactic acid was as high as 5.4 and trended down to 1.6.  He was found to have lobar infiltrates on CXR and flu swab negative.  Viral panel revealed rhinovirus infection.  He was treated for sepsis, acute on chronic respiratory failure.  He was given initially rocephin and zpak until viral panel returned.  He was on 3L O2 during he day also, but is back to baseline of only qhs 2L [place].  He had 5 days of prednisone 40mg  completed.  His CHF remained stable.  He was discharged on breo and bid prn nebs, but does not have a machine.    He does feel a little better today.  At night he coughs and can't sleep.  He is still wearing oxygen at night--2 liters.  He  has not gotten the nebulizer or nebs from home health yet.  He is still wheezing.  He sometimes feels short of breath.  Breathing might be a little better.  Not coughing up mucus.  Stays dry--starts in the throat.  He still has a little bit of his palpitations going on.  He calls it his heart starting and stopping up.  The pleuritic pain he had when he went to the hospital is better.    He has all of his other medications and inhalers on his list.    Ankles are a little swollen.    His stomach has always been hard.  He had some fluid in there in the hospital.  When ems mashed on his stomach, it was full of fluid.  He'd been told before it was bloat.  His LFTs were mildly elevated--cirrhosis.    Heart was stable during hospitalization.    Appetite is good.    [facility] was [facility] at 10am today, but nobody came in.  It looks like his daughter was meant to call them back to let them know if they needed to come.  Nebulizer Rx was sent to pharmacy at discharge, but pt does not have machine.  I printed out a prescription for this to be sent to [place].  We need to f/u his CMP when he  comes in next.  Past Medical History:  Diagnosis Date  . Anxiety   . Arthritis   . Atypical chest pain    a. 05/2017 MV: no ischemia, EF 79%.  . Chest pain 10/20/2017  . Chronic diastolic CHF (congestive heart failure) ([place])    a. 01/2011 Echo: EF 50-55%, gr1 DD, mild AI, nl RV fxn, mild TR/PR; b. 10/2017 Echo: EF 60-65%, mild LVH, gr2 DD.  Marland Kitchen DDD (degenerative disc disease), cervical   . Depressive disorder, not elsewhere classified   . Difficult intubation   . Dysphagia, oral phase   . Dyspnea   . Edema   . Gallstones    a. Symptomatic - s/p lap chole 05/2018.  Marland Kitchen History of DVT (deep vein thrombosis)   . History of kidney stones   . Hypertension   . Hypoxemia   . Impacted cerumen   . Ischemic colitis ([place])    a. 02/2018 GIB - colonoscopy w/ isch colitis. Anticoagulation  resumed.  . Long term current use of anticoagulant 03/02/2011  . LOW BACK PAIN SYNDROME 03/17/2009   Qualifier: Diagnosis of  By: Council Mechanic MD, Hilaria Ota   . Mixed hyperlipidemia   . 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 Other myelopathy   . Pain in limb   . Palpitations   . Paroxysmal Atrial Fibrillation ([place])    a. a. 01/2011 in setting of post-op complications including aspiration pna;  b. CHA2DS2VASc = 4--> Amio/Eliquis.  . Pneumonia 03/06/2003  . Spinal stenosis, unspecified region other than cervical   . 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 Syncope    a. 10/2017-->Event monitor: RSR, rare PACs/PVCs.  . Thoracic or lumbosacral neuritis or radiculitis, unspecified     Past Surgical History:  Procedure Laterality Date  . CARDIOVERSION N/A 06/20/2017   Procedure: CARDIOVERSION;  Surgeon: [name], MD;  Location: [facility] ORS;  Service: Cardiovascular;  Laterality: N/A;  . CATARACT EXTRACTION W/ INTRAOCULAR LENS  IMPLANT, BILATERAL    . CERVICAL FUSION  02/10/2011  . CHOLECYSTECTOMY  05/24/2018  . CHOLECYSTECTOMY N/A 05/24/2018   Procedure: LAPAROSCOPIC CHOLECYSTECTOMY;  Surgeon: Coralie Keens, MD;  Location: [place];  Service: General;  Laterality: N/A;  . COLONOSCOPY WITH PROPOFOL N/A 02/20/2018   Procedure: COLONOSCOPY WITH PROPOFOL;  Surgeon: Lucilla Lame, MD;  Location: [facility] ENDOSCOPY;  Service: Endoscopy;  Laterality: N/A;  . history of abd ultrasound  11/01   fatty liver  . MULTIPLE TOOTH EXTRACTIONS    . ORIF FIBULA FRACTURE Left 01/06/2017   Procedure: OPEN REDUCTION INTERNAL FIXATION (ORIF) FIBULA FRACTURE DISTAL FIBULA;  Surgeon: Melrose Nakayama, MD;  Location: [place];  Service: Orthopedics;  Laterality: Left;  Patient states has problems if he will have a tube in throat for Genera; Anesthesia    Allergies  Allergen Reactions  . Morphine And Related Shortness Of Breath  . Percocet  [Oxycodone-Acetaminophen] Shortness Of Breath  . Valium Shortness Of Breath    Outpatient Encounter Medications as of 03/07/2019  Medication Sig  . albuterol (PROVENTIL) (2.5 MG/3ML) 0.083% nebulizer solution Take 3 mLs (2.5 mg total) by nebulization 2 (two) times daily as needed for wheezing or shortness of breath.  Marland Kitchen amiodarone (PACERONE) 200 MG tablet TAKE 1 TABLET BY MOUTH EVERY DAY  . apixaban (ELIQUIS) 5 MG TABS tablet Take 1 tablet (5 mg total) by mouth 2 (two) times daily.  . cetirizine (ZYRTEC) 10 MG  tablet TAKE 1 TABLET BY MOUTH EVERY DAY  . Cholecalciferol (VITAMIN D) 1000 UNITS capsule Take one tablet once daily  . fluticasone furoate-vilanterol (BREO ELLIPTA) 100-25 MCG/INH AEPB Inhale 1 puff into the lungs daily.  . furosemide (LASIX) 40 MG tablet Take 1 tablet (40 mg total) by mouth daily.  Marland Kitchen gabapentin (NEURONTIN) 300 MG capsule Take 1 capsule (300 mg total) by mouth 2 (two) times daily.  . metoprolol tartrate (LOPRESSOR) 25 MG tablet Take 1 tablet (25 mg total) by mouth 2 (two) times daily.  . pantoprazole (PROTONIX) 40 MG tablet Take 1 tablet (40 mg total) by mouth at bedtime.  . potassium chloride SA (K-DUR,KLOR-CON) 10 MEQ tablet Take 1 tablet (10 mEq total) by mouth daily.  . pravastatin (PRAVACHOL) 80 MG tablet Take 1 tablet (80 mg total) by mouth daily.  . traMADol (ULTRAM) 50 MG tablet Take 1 tablet (50 mg total) by mouth every 6 (six) hours as needed.  . [DISCONTINUED] fluticasone (FLONASE) 50 MCG/ACT nasal spray Place 2 sprays daily into both nostrils.  . [DISCONTINUED] predniSONE (DELTASONE) 20 MG tablet Take 2 tablets (40 mg total) by mouth daily with breakfast for 5 days.   No facility-administered encounter medications on file as of 03/07/2019.     Review of Systems:  Review of Systems  Constitutional: Positive for malaise/fatigue. Negative for chills and fever.  Eyes: Negative for blurred vision.  Respiratory: Positive for cough, shortness of breath and  wheezing. Negative for hemoptysis and sputum production.   Cardiovascular: Positive for palpitations and leg swelling. Negative for chest pain.  Gastrointestinal: Positive for diarrhea. Negative for abdominal pain, blood in stool, constipation and melena.  Genitourinary: Negative for dysuria.  Musculoskeletal: Positive for back pain and neck pain. Negative for falls and joint pain.  Skin: Negative for itching and rash.  Neurological: Positive for tingling and sensory change. Negative for loss of consciousness.  Endo/Heme/Allergies: Bruises/bleeds easily.  Psychiatric/Behavioral: Negative for depression and memory loss. The patient is not nervous/anxious.     Health Maintenance  Topic Date Due  . COLONOSCOPY  02/20/2021  . TETANUS/TDAP  10/15/2023  . INFLUENZA VACCINE  Completed  . PNA vac Low Risk Adult  Completed   Physical Exam Could not be performed as this was a non face to face telephone visit  Labs reviewed: Basic Metabolic Panel: Recent Labs    02/28/19 0059 03/01/19 0241 03/02/19 0222  NA 141 139 139  K 4.2 4.3 4.3  CL 112* 106 104  CO2 22 28 26   GLUCOSE 121* 111* 117*  BUN 20 22 25*  CREATININE 1.20 1.30* 1.31*  CALCIUM 8.2* 8.6* 8.9   Liver Function Tests: Recent Labs    02/27/19 0145 03/01/19 0241  AST 87* 86*  ALT 61* 67*  ALKPHOS 58 42  BILITOT 1.2 0.8  PROT 7.4 6.5  ALBUMIN 4.3 3.5   No results for input(s): LIPASE, AMYLASE in the last 8760 hours. No results for input(s): AMMONIA in the last 8760 hours. CBC: Recent Labs    02/27/19 0145 02/28/19 0059 03/01/19 0241 03/02/19 0222  WBC 13.0* 11.2* 8.3 8.9  NEUTROABS 7.0  --   --   --   HGB 15.2 12.0* 12.8* 12.0*  HCT 47.4 38.2* 39.7 37.7*  MCV 100.0 100.0 100.5* 98.7  PLT 204 170 193 191   Lipid Panel: No results for input(s): CHOL, HDL, LDLCALC, TRIG, CHOLHDL, LDLDIRECT in the last 8760 hours. Lab Results  Component Value Date   HGBA1C 5.7 (H) 05/16/2016  Procedures since last  visit: Dg Chest Portable 1 View  Result Date: 02/27/2019 CLINICAL DATA:  Cough with fever.  Short of breath. EXAM: PORTABLE CHEST 1 VIEW COMPARISON:  10/16/2018 FINDINGS: Cardiac silhouette borderline enlarged. No mediastinal or hilar masses or convincing adenopathy. Clear lungs.  No pleural effusion or pneumothorax. Stable changes from a previous posterior cervical spine fusion. Skeletal structures are demineralized but grossly intact. IMPRESSION: No active disease. Electronically Signed   By: Lajean Manes M.D.   On: 02/27/2019 02:29   Assessment/Plan 1. Lobar pneumonia ([place]) -recovering gradually -still coughing quite a bit so will  - For home use only DME Nebulizer machine  2. Sepsis with acute hypoxic respiratory failure without septic shock, due to unspecified organism ([place]) - resolved - For home use only DME Nebulizer machine  3. Chronic diastolic CHF (congestive heart failure) (HCC) -remained under good control per d/c summary--was noted to hold some of his fluid in his abdomen, not just extremities  4. Paroxysmal atrial fibrillation (HCC) -rate controlled, follows with cardiology closely in [place], cont eliquis  5. Transaminitis -needs f/u labs done when he comes in next  6. Fatty liver -noted on his [place] 03/01/19  7. Pleurisy - resolved with tx of infection - For home use only DME Nebulizer machine  8. Rhinovirus -cause of his infection and lobar findings on xray  9. Chronic respiratory failure with hypoxia (HCC) - improved, back to just hs oxygen therapy which he'd been on prehospitalization - For home use only DME Nebulizer machine   Labs/tests ordered:  Will need CMP at next visit here  Next appt:  03/15/2019  Medications were reconciled from pre-admission, discharge and current state.  Non face-to-face time spent on telehealth visit:  25 minutes  Shameka Aggarwal L. Kate Sweetman, D.O. [facility] [address],  [place] [address] Cell Phone (Mon-Fri 8am-5pm):  [phone] On Call:  [phone] & follow prompts after 5pm & weekends Office Phone:  [phone] Office Fax:  [phone]

## 2019-03-07 NOTE — Patient Instructions (Signed)
Glad you are gradually improving. We will fax the prescription for your nebulizer machine to a durable medical equipment company so you can get it.  The hospital sent the vials of albuterol medicine for it to the pharmacy when you left. I sent a night time cough syrup to your pharmacy.  You are allergic to the medication in tussionex so I could not give you that kind.   Continue to try to stay active. Let me know if you are still having a lot of coughing next week OR if you do not hear more from home health or about the nebulizer machine.

## 2019-03-09 DIAGNOSIS — N183 Chronic kidney disease, stage 3 (moderate): Secondary | ICD-10-CM | POA: Diagnosis not present

## 2019-03-09 DIAGNOSIS — I110 Hypertensive heart disease with heart failure: Secondary | ICD-10-CM | POA: Diagnosis not present

## 2019-03-09 DIAGNOSIS — Z7901 Long term (current) use of anticoagulants: Secondary | ICD-10-CM | POA: Diagnosis not present

## 2019-03-09 DIAGNOSIS — M4722 Other spondylosis with radiculopathy, cervical region: Secondary | ICD-10-CM | POA: Diagnosis not present

## 2019-03-09 DIAGNOSIS — F329 Major depressive disorder, single episode, unspecified: Secondary | ICD-10-CM | POA: Diagnosis not present

## 2019-03-09 DIAGNOSIS — J40 Bronchitis, not specified as acute or chronic: Secondary | ICD-10-CM

## 2019-03-09 DIAGNOSIS — M5010 Cervical disc disorder with radiculopathy, unspecified cervical region: Secondary | ICD-10-CM | POA: Diagnosis not present

## 2019-03-09 DIAGNOSIS — K219 Gastro-esophageal reflux disease without esophagitis: Secondary | ICD-10-CM | POA: Diagnosis not present

## 2019-03-09 DIAGNOSIS — J9621 Acute and chronic respiratory failure with hypoxia: Secondary | ICD-10-CM

## 2019-03-09 DIAGNOSIS — J449 Chronic obstructive pulmonary disease, unspecified: Secondary | ICD-10-CM | POA: Diagnosis not present

## 2019-03-09 DIAGNOSIS — F419 Anxiety disorder, unspecified: Secondary | ICD-10-CM | POA: Diagnosis not present

## 2019-03-09 DIAGNOSIS — M48062 Spinal stenosis, lumbar region with neurogenic claudication: Secondary | ICD-10-CM | POA: Diagnosis not present

## 2019-03-09 DIAGNOSIS — M4803 Spinal stenosis, cervicothoracic region: Secondary | ICD-10-CM | POA: Diagnosis not present

## 2019-03-09 DIAGNOSIS — E559 Vitamin D deficiency, unspecified: Secondary | ICD-10-CM | POA: Diagnosis not present

## 2019-03-09 DIAGNOSIS — M21372 Foot drop, left foot: Secondary | ICD-10-CM | POA: Diagnosis not present

## 2019-03-09 DIAGNOSIS — M4712 Other spondylosis with myelopathy, cervical region: Secondary | ICD-10-CM

## 2019-03-09 DIAGNOSIS — I480 Paroxysmal atrial fibrillation: Secondary | ICD-10-CM

## 2019-03-09 DIAGNOSIS — M4802 Spinal stenosis, cervical region: Secondary | ICD-10-CM | POA: Diagnosis not present

## 2019-03-09 DIAGNOSIS — M1991 Primary osteoarthritis, unspecified site: Secondary | ICD-10-CM | POA: Diagnosis not present

## 2019-03-09 DIAGNOSIS — E782 Mixed hyperlipidemia: Secondary | ICD-10-CM | POA: Diagnosis not present

## 2019-03-09 DIAGNOSIS — G629 Polyneuropathy, unspecified: Secondary | ICD-10-CM | POA: Diagnosis not present

## 2019-03-09 DIAGNOSIS — Z86718 Personal history of other venous thrombosis and embolism: Secondary | ICD-10-CM | POA: Diagnosis not present

## 2019-03-09 DIAGNOSIS — M5000 Cervical disc disorder with myelopathy, unspecified cervical region: Secondary | ICD-10-CM | POA: Diagnosis not present

## 2019-03-09 DIAGNOSIS — I5032 Chronic diastolic (congestive) heart failure: Secondary | ICD-10-CM | POA: Diagnosis not present

## 2019-03-11 ENCOUNTER — Other Ambulatory Visit: Payer: Self-pay

## 2019-03-11 ENCOUNTER — Ambulatory Visit (INDEPENDENT_AMBULATORY_CARE_PROVIDER_SITE_OTHER): Payer: PPO | Admitting: Pulmonary Disease

## 2019-03-11 ENCOUNTER — Other Ambulatory Visit: Payer: Self-pay | Admitting: Internal Medicine

## 2019-03-11 ENCOUNTER — Telehealth: Payer: Self-pay | Admitting: Cardiovascular Disease

## 2019-03-11 ENCOUNTER — Encounter: Payer: Self-pay | Admitting: Pulmonary Disease

## 2019-03-11 DIAGNOSIS — J449 Chronic obstructive pulmonary disease, unspecified: Secondary | ICD-10-CM | POA: Diagnosis not present

## 2019-03-11 DIAGNOSIS — J181 Lobar pneumonia, unspecified organism: Secondary | ICD-10-CM

## 2019-03-11 DIAGNOSIS — R053 Chronic cough: Secondary | ICD-10-CM

## 2019-03-11 DIAGNOSIS — R05 Cough: Secondary | ICD-10-CM

## 2019-03-11 MED ORDER — BENZONATATE 200 MG PO CAPS: 200.0000 mg | ORAL_CAPSULE | Freq: Every evening | ORAL | 0 refills | Status: DC | PRN

## 2019-03-11 NOTE — Telephone Encounter (Signed)
Spoke with patient and reviewed that we needed to convert his appointment to a virtual visit and he prefers telephone encounter. He was agreeable with this and read consent and he provided me with verbal approval. He had no further questions at this time and confirmed his upcoming appointment time and verbalized understanding that he would get call from provider.  YOUR CARDIOLOGY TEAM HAS ARRANGED FOR AN E-VISIT FOR YOUR APPOINTMENT - PLEASE REVIEW IMPORTANT INFORMATION BELOW SEVERAL DAYS PRIOR TO YOUR APPOINTMENT  Due to the recent COVID-19 pandemic, we are transitioning in-person office visits to tele-medicine visits in an effort to decrease unnecessary exposure to our patients and staff. Medicare and most insurances are covering these visits without a copay needed. You will need a smartphone if possible. We also encourage you to sign up for MyChart. For patients that do not have this, we can still complete the visit using a regular telephone but do prefer a smartphone to enable video when possible. You Herzig have a close family member that can help. If possible, we also ask that you have a blood pressure cuff and scale at home to measure your blood pressure, heart rate and weight prior to your scheduled appointment. Patients with clinical needs that need an in-person evaluation and testing will still be able to come to the office if absolutely necessary. If you have any questions, feel free to call our office.    2-3 DAYS BEFORE YOUR APPOINTMENT  You will receive a telephone call from one of our [place] team members - your caller ID [place] say "Unknown caller." If this is a video visit, we will confirm that you have been able to download the WebEx app. We will remind you check your blood pressure, heart rate and weight prior to your scheduled appointment. If you have an Apple Watch or Kardia, please upload any pertinent ECG strips the day before or morning of your appointment to [place]. Our staff will  also make sure you have reviewed the consent and agree to move forward with your scheduled tele-health visit.     THE DAY OF YOUR APPOINTMENT  Approximately 15 minutes prior to your scheduled appointment, you will receive a telephone call from one of [place] team - your caller ID [place] say "Unknown caller."  Our staff will confirm medications, vital signs for the day and any symptoms you Kulig be experiencing. Please have this information available prior to the time of visit start. It Dible also be helpful for you to have a pad of paper and pen handy for any instructions given during your visit. They will also walk you through joining the WebEx smartphone meeting if this is a video visit.    CONSENT FOR TELE-HEALTH VISIT - PLEASE RVIEW  I hereby voluntarily request, consent and authorize [facility] and its employed or contracted physicians, physician assistants, nurse practitioners or other licensed health care professionals (the Practitioner), to provide me with telemedicine health care services (the "Services") as deemed necessary by the treating Practitioner. I acknowledge and consent to receive the Services by the Practitioner via telemedicine. I understand that the telemedicine visit will involve communicating with the Practitioner through live audiovisual communication technology and the disclosure of certain medical information by electronic transmission. I acknowledge that I have been given the opportunity to request an in-person assessment or other available alternative prior to the telemedicine visit and am voluntarily participating in the telemedicine visit.  I understand that I have the right to withhold or withdraw my consent  to the use of telemedicine in the course of my care at any time, without affecting my right to future care or treatment, and that the Practitioner or I [place] terminate the telemedicine visit at any time. I understand that I have the right to inspect all information  obtained and/or recorded in the course of the telemedicine visit and [place] receive copies of available information for a reasonable fee.  I understand that some of the potential risks of receiving the Services via telemedicine include:  Marland Kitchen Delay or interruption in medical evaluation due to technological equipment failure or disruption; . Information transmitted Hogg not be sufficient (e.g. poor resolution of images) to allow for appropriate medical decision making by the Practitioner; and/or  . In rare instances, security protocols could fail, causing a breach of personal health information.  Furthermore, I acknowledge that it is my responsibility to provide information about my medical history, conditions and care that is complete and accurate to the best of my ability. I acknowledge that Practitioner's advice, recommendations, and/or decision Nohr be based on factors not within their control, such as incomplete or inaccurate data provided by me or distortions of diagnostic images or specimens that [place] result from electronic transmissions. I understand that the practice of medicine is not an exact science and that Practitioner makes no warranties or guarantees regarding treatment outcomes. I acknowledge that I will receive a copy of this consent concurrently upon execution via email to the email address I last provided but Gu[place]otte also request a printed copy by calling the office of [place].    I understand that my insurance will be billed for this visit.   I have read or had this consent read to me. . I understand the contents of this consent, which adequately explains the benefits and risks of the Services being provided via telemedicine.  . I have been provided ample opportunity to ask questions regarding this consent and the Services and have had my questions answered to my satisfaction. . I give my informed consent for the services to be provided through the use of telemedicine in my medical  care  By participating in this telemedicine visit I agree to the above.

## 2019-03-11 NOTE — Progress Notes (Signed)
Pt still awake coughing at night.  Delsym not working for him.  His daughter-in-law mentioned this concern and requested an alternative.    Will send tessalon perles one at bedtime prn coughing.

## 2019-03-11 NOTE — Progress Notes (Signed)
Virtual Visit via Telephone Note  I connected with Eric [name] on 03/11/19 at 10:15 AM EDT by telephone and verified that I am speaking with the correct person using two identifiers.   I discussed the limitations, risks, security and privacy concerns of performing an evaluation and management service by telephone and the availability of in person appointments. I also discussed with the patient that there [place] be a patient responsible charge related to this service. The patient expressed understanding and agreed to proceed.   History of Present Illness: Hospitalized 3/18-3/21/2020 with acute viral bronchitis.  RVP was positive for rhinovirus.  He is improved now compared to when he was hospitalized but reports persistent NP cough that originates in back of throat. He has been prescribed nebulized albuterol but has not received this medication from [place] yet.  He is using Protonix every day and is using Breo inhaler compliantly.  He denies fever, sputum production, hemoptysis, chest pain, increased lower extremity edema, calf tenderness.   Observations/Objective: No apparent respiratory distress during phone discussion.  He is able to speak in full sentences.  Assessment and Plan: Chronic cough Presumed chronic asthmatic bronchitis Recent hospitalization for viral URI  He is instructed to continue Breo inhaler daily.  Rinse mouth after use.  Also, continue Protonix each day at dinnertime or bedtime.  When nebulized albuterol is delivered, I recommend that he use it as needed for increased shortness of breath, chest tightness, wheezing, cough.  Follow Up Instructions: Follow-up in 3 months.  Call sooner if needed   I discussed the assessment and treatment plan with the patient. The patient was provided an opportunity to ask questions and all were answered. The patient agreed with the plan and demonstrated an understanding of the instructions.   The patient was advised to call back or  seek an in-person evaluation if the symptoms worsen or if the condition fails to improve as anticipated.  I provided 15 minutes of non-face-to-face time during this encounter.   Merton Border, MD PCCM service Mobile [phone] Pager [phone] 03/11/2019 10:30 AM

## 2019-03-14 ENCOUNTER — Telehealth (INDEPENDENT_AMBULATORY_CARE_PROVIDER_SITE_OTHER): Payer: PPO | Admitting: Cardiovascular Disease

## 2019-03-14 ENCOUNTER — Other Ambulatory Visit: Payer: Self-pay

## 2019-03-14 DIAGNOSIS — I130 Hypertensive heart and chronic kidney disease with heart failure and stage 1 through stage 4 chronic kidney disease, or unspecified chronic kidney disease: Secondary | ICD-10-CM | POA: Diagnosis not present

## 2019-03-14 DIAGNOSIS — J40 Bronchitis, not specified as acute or chronic: Secondary | ICD-10-CM | POA: Diagnosis not present

## 2019-03-14 DIAGNOSIS — I5032 Chronic diastolic (congestive) heart failure: Secondary | ICD-10-CM

## 2019-03-14 DIAGNOSIS — J9621 Acute and chronic respiratory failure with hypoxia: Secondary | ICD-10-CM | POA: Diagnosis not present

## 2019-03-14 DIAGNOSIS — E782 Mixed hyperlipidemia: Secondary | ICD-10-CM

## 2019-03-14 DIAGNOSIS — Z7189 Other specified counseling: Secondary | ICD-10-CM

## 2019-03-14 DIAGNOSIS — I480 Paroxysmal atrial fibrillation: Secondary | ICD-10-CM

## 2019-03-14 DIAGNOSIS — N183 Chronic kidney disease, stage 3 unspecified: Secondary | ICD-10-CM

## 2019-03-14 DIAGNOSIS — I10 Essential (primary) hypertension: Secondary | ICD-10-CM

## 2019-03-14 NOTE — Progress Notes (Addendum)
Virtual Visit via Telephone Note   This visit type was conducted due to national recommendations for restrictions regarding the COVID-19 Pandemic (e.g. social distancing) in an effort to limit this patient's exposure and mitigate transmission in our community.  Due to her co-morbid illnesses, this patient is at least at moderate risk for complications without adequate follow up.  This format is felt to be most appropriate for this patient at this time.  The patient did not have access to video technology/had technical difficulties with video requiring transitioning to audio format only (telephone).  All issues noted in this document were discussed and addressed.  No physical exam could be performed with this format.  Please refer to the patient's chart for her  consent to telehealth for [facility].    Date:  03/14/2019   ID:  Eric [name], DOB Mar 22, 1932, MRN [id number]  Patient Location:  [place] [address]   Provider location:   [facility], [place] office  PCP:  [name], DO  Cardiologist:  Arvid Right [facility]  Chief Complaint:  SOB, cough    History of Present Illness:    Eric [name] is a 85 y.o. male who presents via audio/video conferencing for a telehealth visit today.   The patient does not symptoms concerning for COVID-19 infection (fever, chills, cough, or new SHORTNESS OF BREATH).   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 Initially presented with fever, chills, cough and shortness of breath Sent home with prednisone  Home nurse, comes out 2x a week Nurse said "lungs are clear"  Weight 180 pounds, prior weight in 11/2018 was 187 pounds  difficulty walking No regular exercise program Takes Lasix 40 daily with potassium 10 daily No leg swelling  BP: 137/80, pulse: 93   Prior CV studies:   The following studies were reviewed today:  Event Monitor discussed with him in detail Avg HR of 58 bpm. Predominant underlying rhythm was Sinus Rhythm.  Second Degree AV Block-Mobitz I (Wenckebach) was present.  Isolated SVEs were rare (<1.0%), and no SVE Couplets or SVE Triplets were present. Isolated VEs were rare (<1.0%), and no VE Couplets or VE Triplets were present. Patient triggered events were not associated with significant arrhythmia.  Echocardiogram 04/04/2017 with ejection fraction 55-60%, mildly dilated left atrium    Past Medical History:  Diagnosis Date  . Anxiety   . Arthritis   . Atypical chest pain    a. 05/2017 MV: no ischemia, EF 79%.  . Chest pain 10/20/2017  . Chronic diastolic CHF (congestive heart failure) ([place])    a. 01/2011 Echo: EF 50-55%, gr1 DD, mild AI, nl RV fxn, mild TR/PR; b. 10/2017 Echo: EF 60-65%, mild LVH, gr2 DD.  Marland Kitchen DDD (degenerative disc disease), cervical   .  Depressive disorder, not elsewhere classified   . Difficult intubation   . Dysphagia, oral phase   . Dyspnea   . Edema   . Gallstones    a. Symptomatic - s/p lap chole 05/2018.  Marland Kitchen History of DVT (deep vein thrombosis)   . History of kidney stones   . Hypertension   . Hypoxemia   . Impacted cerumen   . Ischemic colitis  ([place])    a. 02/2018 GIB - colonoscopy w/ isch colitis. Anticoagulation resumed.  . Long term current use of anticoagulant 03/02/2011  . LOW BACK PAIN SYNDROME 03/17/2009   Qualifier: Diagnosis of  By: Council Mechanic MD, Hilaria Ota   . Mixed hyperlipidemia   . 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 Other myelopathy   . Pain in limb   . Palpitations   . Paroxysmal Atrial Fibrillation ([place])    a. a. 01/2011 in setting of post-op complications including aspiration pna;  b. CHA2DS2VASc = 4--> Amio/Eliquis.  . Pneumonia 03/06/2003  . Spinal stenosis, unspecified region other than cervical   . 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 Syncope    a. 10/2017-->Event monitor: RSR, rare PACs/PVCs.  . Thoracic or lumbosacral neuritis or radiculitis, unspecified    Past Surgical History:  Procedure Laterality Date  . CARDIOVERSION N/A 06/20/2017   Procedure: CARDIOVERSION;  Surgeon: [name], MD;  Location: [facility] ORS;  Service: Cardiovascular;  Laterality: N/A;  . CATARACT EXTRACTION W/ INTRAOCULAR LENS  IMPLANT, BILATERAL    . CERVICAL FUSION  02/10/2011  . CHOLECYSTECTOMY  05/24/2018  . CHOLECYSTECTOMY N/A 05/24/2018   Procedure: LAPAROSCOPIC CHOLECYSTECTOMY;  Surgeon: Coralie Keens, MD;  Location: [place];  Service: General;  Laterality: N/A;  . COLONOSCOPY WITH PROPOFOL N/A 02/20/2018   Procedure: COLONOSCOPY WITH PROPOFOL;  Surgeon: Lucilla Lame, MD;  Location: [facility] ENDOSCOPY;  Service: Endoscopy;  Laterality: N/A;  . history of abd ultrasound  11/01   fatty liver  . MULTIPLE TOOTH EXTRACTIONS    . ORIF FIBULA FRACTURE Left 01/06/2017   Procedure: OPEN REDUCTION INTERNAL FIXATION (ORIF) FIBULA FRACTURE DISTAL FIBULA;  Surgeon: Melrose Nakayama, MD;  Location: [place];  Service: Orthopedics;  Laterality: Left;  Patient states has problems if he will have a tube in throat for Genera; Anesthesia     No outpatient medications have  been marked as taking for the 03/14/19 encounter (Appointment) with [name], MD.     Allergies:   Morphine and related; Percocet [oxycodone-acetaminophen]; and Valium   Social History   Tobacco Use  . Smoking status: Former Smoker    Years: 1.00  . Smokeless tobacco: Never Used  . Tobacco comment: stopped in 20's  Substance Use Topics  . Alcohol use: No  . Drug use: No     Current Outpatient Medications on File Prior to Visit  Medication Sig Dispense Refill  . albuterol (PROVENTIL) (2.5 MG/3ML) 0.083% nebulizer solution Take 3 mLs (2.5 mg total) by nebulization 2 (two) times daily as needed for wheezing or shortness of breath. 75 mL 0  . amiodarone (PACERONE) 200 MG tablet TAKE 1 TABLET BY MOUTH EVERY DAY 30 tablet 8  . apixaban (ELIQUIS) 5 MG TABS tablet Take 1 tablet (5 mg total) by mouth 2 (two) times daily. 60 tablet 6  . benzonatate (TESSALON) 200 MG capsule Take 1 capsule (200 mg total) by mouth at bedtime as needed for cough. [place]  capsule 0  . cetirizine (ZYRTEC) 10 MG tablet TAKE 1 TABLET BY MOUTH EVERY DAY 90 tablet 0  . Cholecalciferol (VITAMIN D) 1000 UNITS capsule Take one tablet once daily 90 capsule 3  . diphenhydrAMINE-Phenylephrine (DELSYM NIGHT TIME COUGH/COLD) 6.25-2.5 MG/5ML LIQD Take 5 mLs by mouth at bedtime as needed. 118 mL 1  . fluticasone furoate-vilanterol (BREO ELLIPTA) 100-25 MCG/INH AEPB Inhale 1 puff into the lungs daily.    . furosemide (LASIX) 40 MG tablet Take 1 tablet (40 mg total) by mouth daily. 90 tablet 3  . gabapentin (NEURONTIN) 300 MG capsule Take 1 capsule (300 mg total) by mouth 2 (two) times daily. 180 capsule 1  . metoprolol tartrate (LOPRESSOR) 25 MG tablet Take 1 tablet (25 mg total) by mouth 2 (two) times daily. 60 tablet 11  . pantoprazole (PROTONIX) 40 MG tablet Take 1 tablet (40 mg total) by mouth at bedtime. 90 tablet 1  . potassium chloride SA (K-DUR,KLOR-CON) 10 MEQ tablet Take 1 tablet (10 mEq total) by mouth daily. 90 tablet  3  . pravastatin (PRAVACHOL) 80 MG tablet Take 1 tablet (80 mg total) by mouth daily. 90 tablet 3  . traMADol (ULTRAM) 50 MG tablet Take 1 tablet (50 mg total) by mouth every 6 (six) hours as needed. 20 tablet 0   No current facility-administered medications on file prior to visit.      Family Hx: The patient's family history includes Atrial fibrillation in his brother; Cancer in an other family member; Heart disease in his brother; Other in his mother; Stroke in his father and paternal grandfather. There is no history of Prostate cancer, Kidney cancer, or Bladder Cancer.  ROS:   Please see the history of present illness.    Review of Systems  Constitutional: Negative.   Respiratory: Positive for cough and shortness of breath.   Cardiovascular: Negative.   Gastrointestinal: Negative.   Musculoskeletal: Negative.   Neurological: Negative.   Psychiatric/Behavioral: Negative.   All other systems reviewed and are negative.     Labs/Other Tests and Data Reviewed:    Recent Labs: 02/27/2019: B Natriuretic Peptide 71.3 03/01/2019: ALT 67 03/02/2019: BUN 25; Creatinine, Ser 1.31; Hemoglobin 12.0; Platelets 191; Potassium 4.3; Sodium 139   Recent Lipid Panel Lab Results  Component Value Date/Time   CHOL 187 09/01/2017 10:15 AM   CHOL 169 05/16/2016 10:35 AM   TRIG 303 (H) 09/01/2017 10:15 AM   HDL 34 (L) 09/01/2017 10:15 AM   HDL 37 (L) 05/16/2016 10:35 AM   CHOLHDL 5.5 (H) 09/01/2017 10:15 AM   LDLCALC 113 (H) 09/01/2017 10:15 AM   LDLDIRECT 97.4 11/29/2010 09:21 AM    Wt Readings from Last 3 Encounters:  02/27/19 190 lb (86.2 kg)  01/21/19 191 lb (86.6 kg)  11/20/18 187 lb (84.8 kg)     Exam:    Vital Signs: Vital signs as detailed above in HPI  Well nourished, well developed male in no acute distress. Constitutional:  oriented to person, place, and time. No distress.    ASSESSMENT & PLAN:    PAF (paroxysmal atrial fibrillation) (HCC) Maintaining NSR, on eliquis,  amiodarone, metoprolol  Chronic heart failure with preserved ejection fraction (HCC) euvolemic per history on todays phone call  Bronchitis Recovering, completed prednisone On cough medication  Mixed hyperlipidemia On statin  HYPERTENSION, BENIGN ESSENTIAL Blood pressure is well controlled on today's visit. No changes made to the medications.    COVID-19 Education: The signs and symptoms of COVID-19 were discussed with the  patient and how to seek care for testing (follow up with PCP or arrange E-visit).  The importance of social distancing was discussed today.  Patient Risk:   After full review of this patients clinical status, I feel that they are at least moderate risk at this time.  Time:   Today, I have spent 25 minutes with the patient with telehealth technology discussing bronchitis,SOB/cough, BP, diastolic CHF.     Medication Adjustments/Labs and Tests Ordered: Current medicines are reviewed at length with the patient today.  Concerns regarding medicines are outlined above.   Tests Ordered: No tests ordered   Medication Changes: No changes made   Disposition: Follow-up in 6 months   Signed, Ida Rogue, MD  03/14/2019 2:03 PM    [facility] [id number], [place], [place] [address]

## 2019-03-14 NOTE — Patient Instructions (Addendum)

## 2019-03-15 ENCOUNTER — Encounter: Payer: Self-pay | Admitting: Family

## 2019-03-15 ENCOUNTER — Ambulatory Visit (INDEPENDENT_AMBULATORY_CARE_PROVIDER_SITE_OTHER): Payer: PPO | Admitting: Nurse Practitioner

## 2019-03-15 ENCOUNTER — Encounter: Payer: Self-pay | Admitting: Cardiovascular Disease

## 2019-03-15 ENCOUNTER — Encounter: Payer: Self-pay | Admitting: Nurse Practitioner

## 2019-03-15 ENCOUNTER — Ambulatory Visit: Payer: Self-pay

## 2019-03-15 ENCOUNTER — Other Ambulatory Visit: Payer: Self-pay

## 2019-03-15 DIAGNOSIS — M4722 Other spondylosis with radiculopathy, cervical region: Secondary | ICD-10-CM | POA: Diagnosis not present

## 2019-03-15 DIAGNOSIS — E782 Mixed hyperlipidemia: Secondary | ICD-10-CM | POA: Diagnosis not present

## 2019-03-15 DIAGNOSIS — F329 Major depressive disorder, single episode, unspecified: Secondary | ICD-10-CM | POA: Diagnosis not present

## 2019-03-15 DIAGNOSIS — J449 Chronic obstructive pulmonary disease, unspecified: Secondary | ICD-10-CM | POA: Diagnosis not present

## 2019-03-15 DIAGNOSIS — I5032 Chronic diastolic (congestive) heart failure: Secondary | ICD-10-CM | POA: Diagnosis not present

## 2019-03-15 DIAGNOSIS — J40 Bronchitis, not specified as acute or chronic: Secondary | ICD-10-CM | POA: Diagnosis not present

## 2019-03-15 DIAGNOSIS — M21372 Foot drop, left foot: Secondary | ICD-10-CM | POA: Diagnosis not present

## 2019-03-15 DIAGNOSIS — N183 Chronic kidney disease, stage 3 (moderate): Secondary | ICD-10-CM | POA: Diagnosis not present

## 2019-03-15 DIAGNOSIS — Z0000 Encounter for general adult medical examination without abnormal findings: Secondary | ICD-10-CM | POA: Diagnosis not present

## 2019-03-15 DIAGNOSIS — M4803 Spinal stenosis, cervicothoracic region: Secondary | ICD-10-CM | POA: Diagnosis not present

## 2019-03-15 DIAGNOSIS — Z23 Encounter for immunization: Secondary | ICD-10-CM

## 2019-03-15 DIAGNOSIS — J9621 Acute and chronic respiratory failure with hypoxia: Secondary | ICD-10-CM | POA: Diagnosis not present

## 2019-03-15 DIAGNOSIS — I110 Hypertensive heart disease with heart failure: Secondary | ICD-10-CM | POA: Diagnosis not present

## 2019-03-15 DIAGNOSIS — G629 Polyneuropathy, unspecified: Secondary | ICD-10-CM | POA: Diagnosis not present

## 2019-03-15 DIAGNOSIS — M48062 Spinal stenosis, lumbar region with neurogenic claudication: Secondary | ICD-10-CM | POA: Diagnosis not present

## 2019-03-15 DIAGNOSIS — Z7901 Long term (current) use of anticoagulants: Secondary | ICD-10-CM | POA: Diagnosis not present

## 2019-03-15 DIAGNOSIS — M1991 Primary osteoarthritis, unspecified site: Secondary | ICD-10-CM | POA: Diagnosis not present

## 2019-03-15 DIAGNOSIS — E559 Vitamin D deficiency, unspecified: Secondary | ICD-10-CM | POA: Diagnosis not present

## 2019-03-15 DIAGNOSIS — M4802 Spinal stenosis, cervical region: Secondary | ICD-10-CM | POA: Diagnosis not present

## 2019-03-15 DIAGNOSIS — I480 Paroxysmal atrial fibrillation: Secondary | ICD-10-CM | POA: Diagnosis not present

## 2019-03-15 DIAGNOSIS — M4712 Other spondylosis with myelopathy, cervical region: Secondary | ICD-10-CM | POA: Diagnosis not present

## 2019-03-15 DIAGNOSIS — M5010 Cervical disc disorder with radiculopathy, unspecified cervical region: Secondary | ICD-10-CM | POA: Diagnosis not present

## 2019-03-15 DIAGNOSIS — F419 Anxiety disorder, unspecified: Secondary | ICD-10-CM | POA: Diagnosis not present

## 2019-03-15 DIAGNOSIS — Z86718 Personal history of other venous thrombosis and embolism: Secondary | ICD-10-CM | POA: Diagnosis not present

## 2019-03-15 DIAGNOSIS — K219 Gastro-esophageal reflux disease without esophagitis: Secondary | ICD-10-CM | POA: Diagnosis not present

## 2019-03-15 DIAGNOSIS — M5000 Cervical disc disorder with myelopathy, unspecified cervical region: Secondary | ICD-10-CM | POA: Diagnosis not present

## 2019-03-15 MED ORDER — ZOSTER VAC RECOMB ADJUVANTED 50 MCG/0.5ML IM SUSR: 0.5000 mL | Freq: Once | INTRAMUSCULAR | 1 refills | Status: AC

## 2019-03-15 NOTE — Telephone Encounter (Signed)
This encounter was created in error - please disregard.

## 2019-03-15 NOTE — Addendum Note (Signed)
Addended by: Vonna Kotyk A on: 03/15/2019 11:57 AM   Modules accepted: Orders

## 2019-03-15 NOTE — Patient Instructions (Signed)
Eric [name] , Thank you for taking time to come for your Medicare Wellness Visit. I appreciate your ongoing commitment to your health goals. Please review the following plan we discussed and let me know if I can assist you in the future.   Screening recommendations/referrals: Colonoscopy up to date Recommended yearly ophthalmology/optometry visit for glaucoma screening and checkup Recommended yearly dental visit for hygiene and checkup  Vaccinations: Influenza vaccine up to date Pneumococcal vaccine up to date Tdap vaccine up to date Shingles vaccine :sent to pharmacy     Advanced directives: please complete and bring back to   Conditions/risks identified: risk for falls due to altered gait  Next appointment: 1 year  Preventive Care 83 Years and Older, Male Preventive care refers to lifestyle choices and visits with your health care provider that can promote health and wellness. What does preventive care include?  A yearly physical exam. This is also called an annual well check.  Dental exams once or twice a year.  Routine eye exams. Ask your health care provider how often you should have your eyes checked.  Personal lifestyle choices, including:  Daily care of your teeth and gums.  Regular physical activity.  Eating a healthy diet.  Avoiding tobacco and drug use.  Limiting alcohol use.  Practicing safe sex.  Taking low doses of aspirin every day.  Taking vitamin and mineral supplements as recommended by your health care provider. What happens during an annual well check? The services and screenings done by your health care provider during your annual well check will depend on your age, overall health, lifestyle risk factors, and family history of disease. Counseling  Your health care provider Stearns ask you questions about your:  Alcohol use.  Tobacco use.  Drug use.  Emotional well-being.  Home and relationship well-being.  Sexual activity.  Eating habits.   History of falls.  Memory and ability to understand (cognition).  Work and work Statistician. Screening  You Grabinski have the following tests or measurements:  Height, weight, and BMI.  Blood pressure.  Lipid and cholesterol levels. These Neiswender be checked every 5 years, or more frequently if you are over 83 years old.  Skin check.  Lung cancer screening. You Iglesias have this screening every year starting at age 83 if you have a 30-pack-year history of smoking and currently smoke or have quit within the past 15 years.  Fecal occult blood test (FOBT) of the stool. You Aldous have this test every year starting at age 83.  Flexible sigmoidoscopy or colonoscopy. You Picha have a sigmoidoscopy every 5 years or a colonoscopy every 10 years starting at age 40.  Prostate cancer screening. Recommendations will vary depending on your family history and other risks.  Hepatitis C blood test.  Hepatitis B blood test.  Sexually transmitted disease (STD) testing.  Diabetes screening. This is done by checking your blood sugar (glucose) after you have not eaten for a while (fasting). You Depree have this done every 1-3 years.  Abdominal aortic aneurysm (AAA) screening. You Prowell need this if you are a current or former smoker.  Osteoporosis. You Dejarnette be screened starting at age 56 if you are at high risk. Talk with your health care provider about your test results, treatment options, and if necessary, the need for more tests. Vaccines  Your health care provider Eunice recommend certain vaccines, such as:  Influenza vaccine. This is recommended every year.  Tetanus, diphtheria, and acellular pertussis (Tdap, Td) vaccine. You Peggs need  a Td booster every 10 years.  Zoster vaccine. You Matsuo need this after age 80.  Pneumococcal 13-valent conjugate (PCV13) vaccine. One dose is recommended after age 48.  Pneumococcal polysaccharide (PPSV23) vaccine. One dose is recommended after age 43. Talk to your health care  provider about which screenings and vaccines you need and how often you need them. This information is not intended to replace advice given to you by your health care provider. Make sure you discuss any questions you have with your health care provider. Document Released: 12/25/2015 Document Revised: 08/17/2016 Document Reviewed: 09/29/2015 Elsevier Interactive Patient Education  2017 [place] Prevention in the Home Falls can cause injuries. They can happen to people of all ages. There are many things you can do to make your home safe and to help prevent falls. What can I do on the outside of my home?  Regularly fix the edges of walkways and driveways and fix any cracks.  Remove anything that might make you trip as you walk through a door, such as a raised step or threshold.  Trim any bushes or trees on the path to your home.  Use bright outdoor lighting.  Clear any walking paths of anything that might make someone trip, such as rocks or tools.  Regularly check to see if handrails are loose or broken. Make sure that both sides of any steps have handrails.  Any raised decks and porches should have guardrails on the edges.  Have any leaves, snow, or ice cleared regularly.  Use sand or salt on walking paths during winter.  Clean up any spills in your garage right away. This includes oil or grease spills. What can I do in the bathroom?  Use night lights.  Install grab bars by the toilet and in the tub and shower. Do not use towel bars as grab bars.  Use non-skid mats or decals in the tub or shower.  If you need to sit down in the shower, use a plastic, non-slip stool.  Keep the floor dry. Clean up any water that spills on the floor as soon as it happens.  Remove soap buildup in the tub or shower regularly.  Attach bath mats securely with double-sided non-slip rug tape.  Do not have throw rugs and other things on the floor that can make you trip. What can I do in  the bedroom?  Use night lights.  Make sure that you have a light by your bed that is easy to reach.  Do not use any sheets or blankets that are too big for your bed. They should not hang down onto the floor.  Have a firm chair that has side arms. You can use this for support while you get dressed.  Do not have throw rugs and other things on the floor that can make you trip. What can I do in the kitchen?  Clean up any spills right away.  Avoid walking on wet floors.  Keep items that you use a lot in easy-to-reach places.  If you need to reach something above you, use a strong step stool that has a grab bar.  Keep electrical cords out of the way.  Do not use floor polish or wax that makes floors slippery. If you must use wax, use non-skid floor wax.  Do not have throw rugs and other things on the floor that can make you trip. What can I do with my stairs?  Do not leave any items on the  stairs.  Make sure that there are handrails on both sides of the stairs and use them. Fix handrails that are broken or loose. Make sure that handrails are as long as the stairways.  Check any carpeting to make sure that it is firmly attached to the stairs. Fix any carpet that is loose or worn.  Avoid having throw rugs at the top or bottom of the stairs. If you do have throw rugs, attach them to the floor with carpet tape.  Make sure that you have a light switch at the top of the stairs and the bottom of the stairs. If you do not have them, ask someone to add them for you. What else can I do to help prevent falls?  Wear shoes that:  Do not have high heels.  Have rubber bottoms.  Are comfortable and fit you well.  Are closed at the toe. Do not wear sandals.  If you use a stepladder:  Make sure that it is fully opened. Do not climb a closed stepladder.  Make sure that both sides of the stepladder are locked into place.  Ask someone to hold it for you, if possible.  Clearly mark and  make sure that you can see:  Any grab bars or handrails.  First and last steps.  Where the edge of each step is.  Use tools that help you move around (mobility aids) if they are needed. These include:  Canes.  Walkers.  Scooters.  Crutches.  Turn on the lights when you go into a dark area. Replace any light bulbs as soon as they burn out.  Set up your furniture so you have a clear path. Avoid moving your furniture around.  If any of your floors are uneven, fix them.  If there are any pets around you, be aware of where they are.  Review your medicines with your doctor. Some medicines can make you feel dizzy. This can increase your chance of falling. Ask your doctor what other things that you can do to help prevent falls. This information is not intended to replace advice given to you by your health care provider. Make sure you discuss any questions you have with your health care provider. Document Released: 09/24/2009 Document Revised: 05/05/2016 Document Reviewed: 01/02/2015 Elsevier Interactive Patient Education  2017 [employer].

## 2019-03-15 NOTE — Progress Notes (Signed)
This service is provided via telemedicine  No vital signs collected/recorded due to the encounter was a telemedicine visit.   Location of patient (ex: home, work): Home  Patient consents to a telephone visit:Yes    Location of the provider (ex: office, home):  Office  Name of any referring provider:Ronica Vivian,NP   Names of all persons participating in the telemedicine service and their role in the encounter:  [name] Johnson CMA, Sherrie Mustache NP,Eric [name](patient)  Time spent on call: 7 min  Patient has a Medicare Wellness visit    Subjective:   Eric [name] is a 85 y.o. male who presents for Medicare Annual/Subsequent preventive examination.  Review of Systems:   Cardiac Risk Factors include: advanced age (>65men, >68 women);dyslipidemia;sedentary lifestyle;male gender;obesity (BMI >30kg/m2);hypertension     Objective:    Vitals: There were no vitals taken for this visit.  There is no height or weight on file to calculate BMI.  Advanced Directives 03/15/2019 02/27/2019 05/24/2018 05/22/2018 03/09/2018 02/18/2018 02/18/2018  Does Patient Have a Medical Advance Directive? Yes No Yes Yes Yes Yes No  Type of Advance Directive Living will - Living will;Healthcare Power of Attorney Living will Living will Living will -  Does patient want to make changes to medical advance directive? No - Patient declined - No - Patient declined No - Patient declined No - Patient declined No - Patient declined -  Copy of [place] in Chart? - - No - copy requested - - - -  Would patient like information on creating a medical advance directive? - No - Patient declined - - - No - Patient declined No - Patient declined    Tobacco Social History   Tobacco Use  Smoking Status Former Smoker  . Years: 1.00  Smokeless Tobacco Never Used  Tobacco Comment   stopped in 20's     Counseling given: Not Answered Comment: stopped in 20's   Clinical Intake:  Pre-visit preparation  completed: Yes  Pain : No/denies pain     BMI - recorded: 30.67 Nutritional Status: BMI > 30  Obese Nutritional Risks: None Diabetes: No  How often do you need to have someone help you when you read instructions, pamphlets, or other written materials from your doctor or pharmacy?: 1 - Never What is the last grade level you completed in school?: 12th grade  Interpreter Needed?: No     Past Medical History:  Diagnosis Date  . Anxiety   . Arthritis   . Atypical chest pain    a. 05/2017 MV: no ischemia, EF 79%.  . Chest pain 10/20/2017  . Chronic diastolic CHF (congestive heart failure) ([place])    a. 01/2011 Echo: EF 50-55%, gr1 DD, mild AI, nl RV fxn, mild TR/PR; b. 10/2017 Echo: EF 60-65%, mild LVH, gr2 DD.  Marland Kitchen DDD (degenerative disc disease), cervical   . Depressive disorder, not elsewhere classified   . Difficult intubation   . Dysphagia, oral phase   . Dyspnea   . Edema   . Gallstones    a. Symptomatic - s/p lap chole 05/2018.  Marland Kitchen History of DVT (deep vein thrombosis)   . History of kidney stones   . Hypertension   . Hypoxemia   . Impacted cerumen   . Ischemic colitis ([place])    a. 02/2018 GIB - colonoscopy w/ isch colitis. Anticoagulation resumed.  . Long term current use of anticoagulant 03/02/2011  . LOW BACK PAIN SYNDROME 03/17/2009   Qualifier: Diagnosis of  By:  Council Mechanic MD, Hilaria Ota   . Mixed hyperlipidemia   . 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 Other myelopathy   . Pain in limb   . Palpitations   . Paroxysmal Atrial Fibrillation ([place])    a. a. 01/2011 in setting of post-op complications including aspiration pna;  b. CHA2DS2VASc = 4--> Amio/Eliquis.  . Pneumonia 03/06/2003  . Spinal stenosis, unspecified region other than cervical   . 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 Syncope    a. 10/2017-->Event monitor: RSR, rare PACs/PVCs.  . Thoracic or lumbosacral neuritis or radiculitis,  unspecified    Past Surgical History:  Procedure Laterality Date  . CARDIOVERSION N/A 06/20/2017   Procedure: CARDIOVERSION;  Surgeon: [name], MD;  Location: [facility];  Service: Cardiovascular;  Laterality: N/A;  . CATARACT EXTRACTION W/ INTRAOCULAR LENS  IMPLANT, BILATERAL    . CERVICAL FUSION  02/10/2011  . CHOLECYSTECTOMY  05/24/2018  . CHOLECYSTECTOMY N/A 05/24/2018   Procedure: LAPAROSCOPIC CHOLECYSTECTOMY;  Surgeon: Coralie Keens, MD;  Location: [facility];  Service: General;  Laterality: N/A;  . COLONOSCOPY WITH PROPOFOL N/A 02/20/2018   Procedure: COLONOSCOPY WITH PROPOFOL;  Surgeon: Lucilla Lame, MD;  Location: [facility];  Service: Endoscopy;  Laterality: N/A;  . history of abd ultrasound  11/01   fatty liver  . MULTIPLE TOOTH EXTRACTIONS    . ORIF FIBULA FRACTURE Left 01/06/2017   Procedure: OPEN REDUCTION INTERNAL FIXATION (ORIF) FIBULA FRACTURE DISTAL FIBULA;  Surgeon: Melrose Nakayama, MD;  Location: [facility];  Service: Orthopedics;  Laterality: Left;  Patient states has problems if he will have a tube in throat for Genera; Anesthesia   Family History  Problem Relation Age of Onset  . Stroke Father   . Atrial fibrillation Brother        on coumadin  . Heart disease Brother        AFib- coumadin  . Stroke Paternal Grandfather   . Other Mother        hemorrhage  . Cancer Other        colon cancer at early age  . Prostate cancer Neg Hx   . Kidney cancer Neg Hx   . Bladder Cancer Neg Hx    Social History   Socioeconomic History  . Marital status: Married    Spouse name: Not on file  . Number of children: 2  . Years of education: 6  . Highest education level: High school graduate  Occupational History  . 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 Financial resource strain: Not hard at all  . Food insecurity:    Worry: Never true    Inability: Never true  . Transportation needs:     Medical: No    Non-medical: No  Tobacco Use  . Smoking status: Former Smoker    Years: 1.00  . Smokeless tobacco: Never Used  . Tobacco comment: stopped in 20's  Substance and Sexual Activity  . Alcohol use: No  . Drug use: No  . Sexual activity: Not Currently  Lifestyle  . Physical activity:    Days per week: 7 days    Minutes per session: 10 min  . Stress: Not at all  Relationships  . Social connections:    Talks on phone: More than three times a week  Gets together: More than three times a week    Attends religious service: More than 4 times per year    Active member of club or organization: No    Attends meetings of clubs or organizations: Never    Relationship status: Married  Other Topics Concern  . Not on file  Social History Narrative   Lives at home with his wife.   Left-handed (due to arthritis, he uses his right hand more now).   Caffeine use: 2 cups per day.    Outpatient Encounter Medications as of 03/15/2019  Medication Sig  . albuterol (PROVENTIL) (2.5 MG/3ML) 0.083% nebulizer solution Take 3 mLs (2.5 mg total) by nebulization 2 (two) times daily as needed for wheezing or shortness of breath.  Marland Kitchen amiodarone (PACERONE) 200 MG tablet TAKE 1 TABLET BY MOUTH EVERY DAY  . apixaban (ELIQUIS) 5 MG TABS tablet Take 1 tablet (5 mg total) by mouth 2 (two) times daily.  . benzonatate (TESSALON) 200 MG capsule Take 1 capsule (200 mg total) by mouth at bedtime as needed for cough.  . cetirizine (ZYRTEC) 10 MG tablet TAKE 1 TABLET BY MOUTH EVERY DAY  . Cholecalciferol (VITAMIN D) 1000 UNITS capsule Take one tablet once daily  . fluticasone furoate-vilanterol (BREO ELLIPTA) 100-25 MCG/INH AEPB Inhale 1 puff into the lungs daily.  . furosemide (LASIX) 40 MG tablet Take 1 tablet (40 mg total) by mouth daily.  Marland Kitchen gabapentin (NEURONTIN) 300 MG capsule Take 1 capsule (300 mg total) by mouth 2 (two) times daily.  . metoprolol tartrate (LOPRESSOR) 25 MG tablet Take 1 tablet (25 mg  total) by mouth 2 (two) times daily.  . pantoprazole (PROTONIX) 40 MG tablet Take 1 tablet (40 mg total) by mouth at bedtime.  . potassium chloride SA (K-DUR,KLOR-CON) 10 MEQ tablet Take 1 tablet (10 mEq total) by mouth daily.  . pravastatin (PRAVACHOL) 80 MG tablet Take 1 tablet (80 mg total) by mouth daily.  . traMADol (ULTRAM) 50 MG tablet Take 1 tablet (50 mg total) by mouth every 6 (six) hours as needed.  . [DISCONTINUED] diphenhydrAMINE-Phenylephrine (DELSYM NIGHT TIME COUGH/COLD) 6.25-2.5 MG/5ML LIQD Take 5 mLs by mouth at bedtime as needed.   No facility-administered encounter medications on file as of 03/15/2019.     Activities of Daily Living In your present state of health, do you have any difficulty performing the following activities: 03/15/2019 02/27/2019  Hearing? N N  Vision? N N  Difficulty concentrating or making decisions? N N  Walking or climbing stairs? Y N  Dressing or bathing? N N  Doing errands, shopping? Tempie Donning  Preparing Food and eating ? N -  Using the Toilet? N -  In the past six months, have you accidently leaked urine? N -  Do you have problems with loss of bowel control? N -  Managing your Medications? N -  Managing your Finances? N -  Housekeeping or managing your Housekeeping? Y -  Some recent data might be hidden    Patient Care Team: [name], DO as PCP - General (Geriatric Medicine) [name], MD as PCP - Cardiology (Cardiology) [name], MD as Consulting Physician (Cardiology) Normajean Glasgow, MD as Attending Physician (Physical Medicine and Rehabilitation) Phylliss Bob, MD as Consulting Physician (Orthopedic Surgery) Wilhelmina Mcardle, MD as Consulting Physician (Pulmonary Disease)   Assessment:   This is a routine wellness examination for Eric [name].  Exercise Activities and Dietary recommendations Current Exercise Habits: The patient does not participate in  regular exercise at present, Exercise limited by: orthopedic  condition(s)  Goals    . pt stated     To walk better       Fall Risk Fall Risk  03/15/2019 03/07/2019 08/20/2018 03/09/2018 12/21/2017  Falls in the past year? 0 0 No Yes No  Number falls in past yr: 0 0 - 2 or more -  Comment - - - - -  Injury with Fall? 0 0 - No -   Is the patient's home free of loose throw rugs in walkways, pet beds, electrical cords, etc?   yes      Grab bars in the bathroom? no      Handrails on the stairs?   yes      Adequate lighting?   yes  Timed Get Up and Go Performed: n/a  Depression Screen PHQ 2/9 Scores 03/07/2019 08/20/2018 03/09/2018 12/21/2017  PHQ - 2 Score 0 0 0 0  Exception Documentation - - - -    Cognitive Function MMSE - Mini Mental State Exam 03/09/2018 05/16/2016  Orientation to time 5 5  Orientation to Place 5 5  Registration 3 3  Attention/ Calculation 5 5  Recall 2 3  Language- name 2 objects 2 2  Language- repeat 1 1  Language- follow 3 step command 3 3  Language- read & follow direction 1 1  Write a sentence 1 1  Copy design 0 1  Total score 28 30     6CIT Screen 03/15/2019  What Year? 0 points  What month? 0 points  What time? 0 points  Count back from 20 2 points  Months in reverse 0 points  Repeat phrase 4 points  Total Score 6    Immunization History  Administered Date(s) Administered  . Influenza Whole 09/11/1997, 10/11/2007, 10/06/2010  . Influenza, High Dose Seasonal PF 09/01/2017, 08/30/2018  . Influenza,inj,Quad PF,6+ Mos 10/03/2013, 09/15/2014, 09/04/2015  . Pneumococcal Conjugate-13 01/19/2015  . Pneumococcal Polysaccharide-23 03/05/2002  . Td 01/19/2003  . Tdap 10/14/2013  . Zoster 10/14/2013    Qualifies for Shingles Vaccine? Yes.   Screening Tests Health Maintenance  Topic Date Due  . INFLUENZA VACCINE  07/13/2019  . COLONOSCOPY  02/20/2021  . TETANUS/TDAP  10/15/2023  . PNA vac Low Risk Adult  Completed   Cancer Screenings: Lung: Low Dose CT Chest recommended if Age 50-80 years, 30 pack-year  currently smoking OR have quit w/in 15years. Patient does not qualify. Colorectal: up to date  Additional Screenings:  Hepatitis C Screening: negative.       Plan:    I have personally reviewed and noted the following in the patient's chart:   . Medical and social history . Use of alcohol, tobacco or illicit drugs  . Current medications and supplements . Functional ability and status . Nutritional status . Physical activity . Advanced directives . List of other physicians . Hospitalizations, surgeries, and ER visits in previous 12 months . Vitals . Screenings to include cognitive, depression, and falls . Referrals and appointments  In addition, I have reviewed and discussed with patient certain preventive protocols, quality metrics, and best practice recommendations. A written personalized care plan for preventive services as well as general preventive health recommendations were provided to patient.     Lauree Chandler, NP  03/15/2019

## 2019-03-18 DIAGNOSIS — M5010 Cervical disc disorder with radiculopathy, unspecified cervical region: Secondary | ICD-10-CM | POA: Diagnosis not present

## 2019-03-18 DIAGNOSIS — K219 Gastro-esophageal reflux disease without esophagitis: Secondary | ICD-10-CM | POA: Diagnosis not present

## 2019-03-18 DIAGNOSIS — M4802 Spinal stenosis, cervical region: Secondary | ICD-10-CM | POA: Diagnosis not present

## 2019-03-18 DIAGNOSIS — F329 Major depressive disorder, single episode, unspecified: Secondary | ICD-10-CM | POA: Diagnosis not present

## 2019-03-18 DIAGNOSIS — F419 Anxiety disorder, unspecified: Secondary | ICD-10-CM | POA: Diagnosis not present

## 2019-03-18 DIAGNOSIS — Z7901 Long term (current) use of anticoagulants: Secondary | ICD-10-CM | POA: Diagnosis not present

## 2019-03-18 DIAGNOSIS — I5032 Chronic diastolic (congestive) heart failure: Secondary | ICD-10-CM | POA: Diagnosis not present

## 2019-03-18 DIAGNOSIS — M4712 Other spondylosis with myelopathy, cervical region: Secondary | ICD-10-CM | POA: Diagnosis not present

## 2019-03-18 DIAGNOSIS — M5000 Cervical disc disorder with myelopathy, unspecified cervical region: Secondary | ICD-10-CM | POA: Diagnosis not present

## 2019-03-18 DIAGNOSIS — M21372 Foot drop, left foot: Secondary | ICD-10-CM | POA: Diagnosis not present

## 2019-03-18 DIAGNOSIS — I480 Paroxysmal atrial fibrillation: Secondary | ICD-10-CM | POA: Diagnosis not present

## 2019-03-18 DIAGNOSIS — M4803 Spinal stenosis, cervicothoracic region: Secondary | ICD-10-CM | POA: Diagnosis not present

## 2019-03-18 DIAGNOSIS — M1991 Primary osteoarthritis, unspecified site: Secondary | ICD-10-CM | POA: Diagnosis not present

## 2019-03-18 DIAGNOSIS — N183 Chronic kidney disease, stage 3 (moderate): Secondary | ICD-10-CM | POA: Diagnosis not present

## 2019-03-18 DIAGNOSIS — Z86718 Personal history of other venous thrombosis and embolism: Secondary | ICD-10-CM | POA: Diagnosis not present

## 2019-03-18 DIAGNOSIS — M48062 Spinal stenosis, lumbar region with neurogenic claudication: Secondary | ICD-10-CM | POA: Diagnosis not present

## 2019-03-18 DIAGNOSIS — J449 Chronic obstructive pulmonary disease, unspecified: Secondary | ICD-10-CM | POA: Diagnosis not present

## 2019-03-18 DIAGNOSIS — M4722 Other spondylosis with radiculopathy, cervical region: Secondary | ICD-10-CM | POA: Diagnosis not present

## 2019-03-18 DIAGNOSIS — E559 Vitamin D deficiency, unspecified: Secondary | ICD-10-CM | POA: Diagnosis not present

## 2019-03-18 DIAGNOSIS — I110 Hypertensive heart disease with heart failure: Secondary | ICD-10-CM | POA: Diagnosis not present

## 2019-03-18 DIAGNOSIS — E782 Mixed hyperlipidemia: Secondary | ICD-10-CM | POA: Diagnosis not present

## 2019-03-18 DIAGNOSIS — J9621 Acute and chronic respiratory failure with hypoxia: Secondary | ICD-10-CM | POA: Diagnosis not present

## 2019-03-18 DIAGNOSIS — J40 Bronchitis, not specified as acute or chronic: Secondary | ICD-10-CM | POA: Diagnosis not present

## 2019-03-18 DIAGNOSIS — G629 Polyneuropathy, unspecified: Secondary | ICD-10-CM | POA: Diagnosis not present

## 2019-03-19 ENCOUNTER — Other Ambulatory Visit: Payer: Self-pay

## 2019-03-19 ENCOUNTER — Other Ambulatory Visit: Payer: Self-pay | Admitting: Internal Medicine

## 2019-03-19 MED ORDER — GABAPENTIN 300 MG PO CAPS: 300.0000 mg | ORAL_CAPSULE | Freq: Two times a day (BID) | ORAL | 1 refills | Status: DC

## 2019-03-22 DIAGNOSIS — J9601 Acute respiratory failure with hypoxia: Secondary | ICD-10-CM | POA: Diagnosis not present

## 2019-03-22 DIAGNOSIS — J189 Pneumonia, unspecified organism: Secondary | ICD-10-CM | POA: Diagnosis not present

## 2019-03-24 DIAGNOSIS — I5032 Chronic diastolic (congestive) heart failure: Secondary | ICD-10-CM | POA: Diagnosis not present

## 2019-03-24 DIAGNOSIS — R062 Wheezing: Secondary | ICD-10-CM | POA: Diagnosis not present

## 2019-03-25 ENCOUNTER — Telehealth: Payer: Self-pay | Admitting: *Deleted

## 2019-03-25 NOTE — Telephone Encounter (Signed)
Pt will try the "in house nebulizer from [facility]" and let us know how he's doing.

## 2019-03-25 NOTE — Telephone Encounter (Signed)
I'm not sure how to solve this nebulizer problem.   Did the tessalon perles help at all?   The only meds I can give him for cough are available over the counter.  He's allergic to codeine.

## 2019-03-25 NOTE — Telephone Encounter (Signed)
Pt calling asking for something different for his cough, it's keeping him up at night and he never received the nebulizer machine. Please advise

## 2019-03-29 DIAGNOSIS — K219 Gastro-esophageal reflux disease without esophagitis: Secondary | ICD-10-CM | POA: Diagnosis not present

## 2019-03-29 DIAGNOSIS — M1991 Primary osteoarthritis, unspecified site: Secondary | ICD-10-CM | POA: Diagnosis not present

## 2019-03-29 DIAGNOSIS — J9621 Acute and chronic respiratory failure with hypoxia: Secondary | ICD-10-CM | POA: Diagnosis not present

## 2019-03-29 DIAGNOSIS — M4712 Other spondylosis with myelopathy, cervical region: Secondary | ICD-10-CM | POA: Diagnosis not present

## 2019-03-29 DIAGNOSIS — M48062 Spinal stenosis, lumbar region with neurogenic claudication: Secondary | ICD-10-CM | POA: Diagnosis not present

## 2019-03-29 DIAGNOSIS — M5000 Cervical disc disorder with myelopathy, unspecified cervical region: Secondary | ICD-10-CM | POA: Diagnosis not present

## 2019-03-29 DIAGNOSIS — M21372 Foot drop, left foot: Secondary | ICD-10-CM | POA: Diagnosis not present

## 2019-03-29 DIAGNOSIS — F329 Major depressive disorder, single episode, unspecified: Secondary | ICD-10-CM | POA: Diagnosis not present

## 2019-03-29 DIAGNOSIS — M4802 Spinal stenosis, cervical region: Secondary | ICD-10-CM | POA: Diagnosis not present

## 2019-03-29 DIAGNOSIS — F419 Anxiety disorder, unspecified: Secondary | ICD-10-CM | POA: Diagnosis not present

## 2019-03-29 DIAGNOSIS — I110 Hypertensive heart disease with heart failure: Secondary | ICD-10-CM | POA: Diagnosis not present

## 2019-03-29 DIAGNOSIS — J40 Bronchitis, not specified as acute or chronic: Secondary | ICD-10-CM | POA: Diagnosis not present

## 2019-03-29 DIAGNOSIS — I480 Paroxysmal atrial fibrillation: Secondary | ICD-10-CM | POA: Diagnosis not present

## 2019-03-29 DIAGNOSIS — M5010 Cervical disc disorder with radiculopathy, unspecified cervical region: Secondary | ICD-10-CM | POA: Diagnosis not present

## 2019-03-29 DIAGNOSIS — Z7901 Long term (current) use of anticoagulants: Secondary | ICD-10-CM | POA: Diagnosis not present

## 2019-03-29 DIAGNOSIS — Z86718 Personal history of other venous thrombosis and embolism: Secondary | ICD-10-CM | POA: Diagnosis not present

## 2019-03-29 DIAGNOSIS — M4722 Other spondylosis with radiculopathy, cervical region: Secondary | ICD-10-CM | POA: Diagnosis not present

## 2019-03-29 DIAGNOSIS — G629 Polyneuropathy, unspecified: Secondary | ICD-10-CM | POA: Diagnosis not present

## 2019-03-29 DIAGNOSIS — M4803 Spinal stenosis, cervicothoracic region: Secondary | ICD-10-CM | POA: Diagnosis not present

## 2019-03-29 DIAGNOSIS — J449 Chronic obstructive pulmonary disease, unspecified: Secondary | ICD-10-CM | POA: Diagnosis not present

## 2019-03-29 DIAGNOSIS — E782 Mixed hyperlipidemia: Secondary | ICD-10-CM | POA: Diagnosis not present

## 2019-03-29 DIAGNOSIS — N183 Chronic kidney disease, stage 3 (moderate): Secondary | ICD-10-CM | POA: Diagnosis not present

## 2019-03-29 DIAGNOSIS — I5032 Chronic diastolic (congestive) heart failure: Secondary | ICD-10-CM | POA: Diagnosis not present

## 2019-03-29 DIAGNOSIS — E559 Vitamin D deficiency, unspecified: Secondary | ICD-10-CM | POA: Diagnosis not present

## 2019-04-08 ENCOUNTER — Other Ambulatory Visit: Payer: Self-pay | Admitting: *Deleted

## 2019-04-08 MED ORDER — ALBUTEROL SULFATE (2.5 MG/3ML) 0.083% IN NEBU: 2.5000 mg | INHALATION_SOLUTION | Freq: Two times a day (BID) | RESPIRATORY_TRACT | 3 refills | Status: DC | PRN

## 2019-04-17 ENCOUNTER — Other Ambulatory Visit: Payer: Self-pay

## 2019-04-17 DIAGNOSIS — J181 Lobar pneumonia, unspecified organism: Secondary | ICD-10-CM

## 2019-04-17 MED ORDER — BENZONATATE 200 MG PO CAPS: 200.0000 mg | ORAL_CAPSULE | Freq: Every evening | ORAL | 0 refills | Status: DC | PRN

## 2019-04-18 DIAGNOSIS — M4802 Spinal stenosis, cervical region: Secondary | ICD-10-CM | POA: Diagnosis not present

## 2019-04-18 DIAGNOSIS — M1991 Primary osteoarthritis, unspecified site: Secondary | ICD-10-CM | POA: Diagnosis not present

## 2019-04-18 DIAGNOSIS — J40 Bronchitis, not specified as acute or chronic: Secondary | ICD-10-CM | POA: Diagnosis not present

## 2019-04-18 DIAGNOSIS — M4712 Other spondylosis with myelopathy, cervical region: Secondary | ICD-10-CM | POA: Diagnosis not present

## 2019-04-18 DIAGNOSIS — K219 Gastro-esophageal reflux disease without esophagitis: Secondary | ICD-10-CM | POA: Diagnosis not present

## 2019-04-18 DIAGNOSIS — Z7901 Long term (current) use of anticoagulants: Secondary | ICD-10-CM | POA: Diagnosis not present

## 2019-04-18 DIAGNOSIS — I5032 Chronic diastolic (congestive) heart failure: Secondary | ICD-10-CM | POA: Diagnosis not present

## 2019-04-18 DIAGNOSIS — M4722 Other spondylosis with radiculopathy, cervical region: Secondary | ICD-10-CM | POA: Diagnosis not present

## 2019-04-18 DIAGNOSIS — M4803 Spinal stenosis, cervicothoracic region: Secondary | ICD-10-CM | POA: Diagnosis not present

## 2019-04-18 DIAGNOSIS — F419 Anxiety disorder, unspecified: Secondary | ICD-10-CM | POA: Diagnosis not present

## 2019-04-18 DIAGNOSIS — M5010 Cervical disc disorder with radiculopathy, unspecified cervical region: Secondary | ICD-10-CM | POA: Diagnosis not present

## 2019-04-18 DIAGNOSIS — M48062 Spinal stenosis, lumbar region with neurogenic claudication: Secondary | ICD-10-CM | POA: Diagnosis not present

## 2019-04-18 DIAGNOSIS — G629 Polyneuropathy, unspecified: Secondary | ICD-10-CM | POA: Diagnosis not present

## 2019-04-18 DIAGNOSIS — J449 Chronic obstructive pulmonary disease, unspecified: Secondary | ICD-10-CM | POA: Diagnosis not present

## 2019-04-18 DIAGNOSIS — Z86718 Personal history of other venous thrombosis and embolism: Secondary | ICD-10-CM | POA: Diagnosis not present

## 2019-04-18 DIAGNOSIS — M21372 Foot drop, left foot: Secondary | ICD-10-CM | POA: Diagnosis not present

## 2019-04-18 DIAGNOSIS — E559 Vitamin D deficiency, unspecified: Secondary | ICD-10-CM | POA: Diagnosis not present

## 2019-04-18 DIAGNOSIS — E782 Mixed hyperlipidemia: Secondary | ICD-10-CM | POA: Diagnosis not present

## 2019-04-18 DIAGNOSIS — J9621 Acute and chronic respiratory failure with hypoxia: Secondary | ICD-10-CM | POA: Diagnosis not present

## 2019-04-18 DIAGNOSIS — M5000 Cervical disc disorder with myelopathy, unspecified cervical region: Secondary | ICD-10-CM | POA: Diagnosis not present

## 2019-04-18 DIAGNOSIS — N183 Chronic kidney disease, stage 3 (moderate): Secondary | ICD-10-CM | POA: Diagnosis not present

## 2019-04-18 DIAGNOSIS — I480 Paroxysmal atrial fibrillation: Secondary | ICD-10-CM | POA: Diagnosis not present

## 2019-04-18 DIAGNOSIS — I110 Hypertensive heart disease with heart failure: Secondary | ICD-10-CM | POA: Diagnosis not present

## 2019-04-18 DIAGNOSIS — F329 Major depressive disorder, single episode, unspecified: Secondary | ICD-10-CM | POA: Diagnosis not present

## 2019-04-21 DIAGNOSIS — J189 Pneumonia, unspecified organism: Secondary | ICD-10-CM | POA: Diagnosis not present

## 2019-04-21 DIAGNOSIS — J9601 Acute respiratory failure with hypoxia: Secondary | ICD-10-CM | POA: Diagnosis not present

## 2019-04-23 DIAGNOSIS — R062 Wheezing: Secondary | ICD-10-CM | POA: Diagnosis not present

## 2019-04-23 DIAGNOSIS — I5032 Chronic diastolic (congestive) heart failure: Secondary | ICD-10-CM | POA: Diagnosis not present

## 2019-04-25 ENCOUNTER — Telehealth: Payer: Self-pay

## 2019-04-25 NOTE — Telephone Encounter (Signed)
Is he still using the breo and his albuterol nebulizers?  He has a codeine allergy so there are no other prescription cough medication options.

## 2019-04-25 NOTE — Telephone Encounter (Signed)
Santiago Glad ([facility] nurse) called and stated that she had just checked on patient. He is sounding more congested, has a lot of wheezing, coughing more, no fever, elevated bp 152/72 and patient's oxygen sat was 96 %. Stated benzonatate is no longer helping patient with cough and his insurance will no longer cover the cost of refills. Nurse is concerned he is not getting any better and cough is interfering with patient's sleep at night. Santiago Glad would like to see if there was anything else provider could prescribe or suggest for the patient at this time. She also stated that patient's systolic bp is usually in 130s.

## 2019-04-26 NOTE — Telephone Encounter (Signed)
Patient is using his Nebulizer twice daily (morning and night) and Breo. And takes the cough medication at night before bedtime.

## 2019-04-26 NOTE — Telephone Encounter (Signed)
If he is not getting better, he probably does need to be seen in the office.  Rodena Piety, can you investigate about whether he really is struggling?

## 2019-04-30 DIAGNOSIS — J449 Chronic obstructive pulmonary disease, unspecified: Secondary | ICD-10-CM | POA: Diagnosis not present

## 2019-04-30 DIAGNOSIS — M5010 Cervical disc disorder with radiculopathy, unspecified cervical region: Secondary | ICD-10-CM | POA: Diagnosis not present

## 2019-04-30 DIAGNOSIS — M4803 Spinal stenosis, cervicothoracic region: Secondary | ICD-10-CM | POA: Diagnosis not present

## 2019-04-30 DIAGNOSIS — F419 Anxiety disorder, unspecified: Secondary | ICD-10-CM | POA: Diagnosis not present

## 2019-04-30 DIAGNOSIS — M48062 Spinal stenosis, lumbar region with neurogenic claudication: Secondary | ICD-10-CM | POA: Diagnosis not present

## 2019-04-30 DIAGNOSIS — I480 Paroxysmal atrial fibrillation: Secondary | ICD-10-CM | POA: Diagnosis not present

## 2019-04-30 DIAGNOSIS — J9621 Acute and chronic respiratory failure with hypoxia: Secondary | ICD-10-CM | POA: Diagnosis not present

## 2019-04-30 DIAGNOSIS — E782 Mixed hyperlipidemia: Secondary | ICD-10-CM | POA: Diagnosis not present

## 2019-04-30 DIAGNOSIS — M1991 Primary osteoarthritis, unspecified site: Secondary | ICD-10-CM | POA: Diagnosis not present

## 2019-04-30 DIAGNOSIS — E559 Vitamin D deficiency, unspecified: Secondary | ICD-10-CM | POA: Diagnosis not present

## 2019-04-30 DIAGNOSIS — N183 Chronic kidney disease, stage 3 (moderate): Secondary | ICD-10-CM | POA: Diagnosis not present

## 2019-04-30 DIAGNOSIS — I5032 Chronic diastolic (congestive) heart failure: Secondary | ICD-10-CM | POA: Diagnosis not present

## 2019-04-30 DIAGNOSIS — M4722 Other spondylosis with radiculopathy, cervical region: Secondary | ICD-10-CM | POA: Diagnosis not present

## 2019-04-30 DIAGNOSIS — K219 Gastro-esophageal reflux disease without esophagitis: Secondary | ICD-10-CM | POA: Diagnosis not present

## 2019-04-30 DIAGNOSIS — Z86718 Personal history of other venous thrombosis and embolism: Secondary | ICD-10-CM | POA: Diagnosis not present

## 2019-04-30 DIAGNOSIS — M4802 Spinal stenosis, cervical region: Secondary | ICD-10-CM | POA: Diagnosis not present

## 2019-04-30 DIAGNOSIS — G629 Polyneuropathy, unspecified: Secondary | ICD-10-CM | POA: Diagnosis not present

## 2019-04-30 DIAGNOSIS — M21372 Foot drop, left foot: Secondary | ICD-10-CM | POA: Diagnosis not present

## 2019-04-30 DIAGNOSIS — M5000 Cervical disc disorder with myelopathy, unspecified cervical region: Secondary | ICD-10-CM | POA: Diagnosis not present

## 2019-04-30 DIAGNOSIS — J40 Bronchitis, not specified as acute or chronic: Secondary | ICD-10-CM | POA: Diagnosis not present

## 2019-04-30 DIAGNOSIS — Z7901 Long term (current) use of anticoagulants: Secondary | ICD-10-CM | POA: Diagnosis not present

## 2019-04-30 DIAGNOSIS — M4712 Other spondylosis with myelopathy, cervical region: Secondary | ICD-10-CM | POA: Diagnosis not present

## 2019-04-30 DIAGNOSIS — I110 Hypertensive heart disease with heart failure: Secondary | ICD-10-CM | POA: Diagnosis not present

## 2019-04-30 DIAGNOSIS — F329 Major depressive disorder, single episode, unspecified: Secondary | ICD-10-CM | POA: Diagnosis not present

## 2019-05-01 ENCOUNTER — Ambulatory Visit (INDEPENDENT_AMBULATORY_CARE_PROVIDER_SITE_OTHER): Payer: PPO | Admitting: Nurse Practitioner

## 2019-05-01 ENCOUNTER — Ambulatory Visit
Admission: RE | Admit: 2019-05-01 | Discharge: 2019-05-01 | Disposition: A | Discharge status: Home / Self Care | Payer: PPO | Source: Ambulatory Visit | Attending: Nurse Practitioner | Admitting: Nurse Practitioner

## 2019-05-01 ENCOUNTER — Other Ambulatory Visit: Payer: Self-pay

## 2019-05-01 ENCOUNTER — Encounter: Payer: Self-pay | Admitting: Nurse Practitioner

## 2019-05-01 VITALS — BP 150/90 | HR 68 | Temp 98.0°F | Ht 66.0 in | Wt 193.0 lb

## 2019-05-01 DIAGNOSIS — I5032 Chronic diastolic (congestive) heart failure: Secondary | ICD-10-CM | POA: Diagnosis not present

## 2019-05-01 DIAGNOSIS — R05 Cough: Secondary | ICD-10-CM

## 2019-05-01 DIAGNOSIS — R945 Abnormal results of liver function studies: Secondary | ICD-10-CM

## 2019-05-01 DIAGNOSIS — R053 Chronic cough: Secondary | ICD-10-CM

## 2019-05-01 DIAGNOSIS — J9611 Chronic respiratory failure with hypoxia: Secondary | ICD-10-CM | POA: Diagnosis not present

## 2019-05-01 DIAGNOSIS — R7989 Other specified abnormal findings of blood chemistry: Secondary | ICD-10-CM

## 2019-05-01 MED ORDER — ALBUTEROL SULFATE (2.5 MG/3ML) 0.083% IN NEBU: 2.5000 mg | INHALATION_SOLUTION | Freq: Two times a day (BID) | RESPIRATORY_TRACT | 3 refills | Status: DC | PRN

## 2019-05-01 MED ORDER — FLUTICASONE FUROATE-VILANTEROL 100-25 MCG/INH IN AEPB: 1.0000 | INHALATION_SPRAY | Freq: Every day | RESPIRATORY_TRACT | 1 refills | Status: DC

## 2019-05-01 NOTE — Patient Instructions (Signed)
Make sure you are taking your lasix routinely. Do not skip doses  Will get lab work and chest xray today.

## 2019-05-01 NOTE — Progress Notes (Signed)
Careteam: Patient Care Team: [name], DO as PCP - General (Geriatric Medicine) [name], MD as PCP - Cardiology (Cardiology) [name], MD as Consulting Physician (Cardiology) Normajean Glasgow, MD as Attending Physician (Physical Medicine and Rehabilitation) Phylliss Bob, MD as Consulting Physician (Orthopedic Surgery) Wilhelmina Mcardle, MD as Consulting Physician (Pulmonary Disease)  Advanced Directive information    Allergies  Allergen Reactions  . Morphine And Related Shortness Of Breath  . Percocet [Oxycodone-Acetaminophen] Shortness Of Breath  . Valium Shortness Of Breath    Chief Complaint  Patient presents with  . Acute Visit    Long term wheezing, cough and SOB. Cough causes interruption in sleep. On 2 liters of O2 at night. SOB is worse with minimal activity such as putting on socks and shoes.   . Medication Refill    Refill Breo and nebulizer medications      HPI: Patient is a 85 y.o. male seen in the office today due to worsening shortness of breath but has been chronic. Progressive symptoms over time.  Home health nurse noted that he had wheezing and wanted him evaluated.   Reports he went into the hospital in March due to acute on chronic respiratory failure with lobar pneumonia. Has followed up with pulmonary since then. Reports he still has the cough, taking tessalon pearles which are not helpful. Has had this cough for 2-3 years and no one knows where it is coming from. Has been following with pulmonary.   Yesterday blood pressure was 147/88.  Took blood pressure medication this morning. Did not take fluid pill.  States they use meals on wheels for food, does not add salt.  Does admit some increase in shortness of breath today vs last week but reports this goes and comes. No increase in shortness of breath currently No increase in edema. Does not weigh regularly.  No fevers or chills.  Sleeps laying flat at night and denies needing to  be propped up or worsening shortness of breath   Review of Systems:  Review of Systems  Constitutional: Positive for malaise/fatigue. Negative for chills and fever.  Eyes: Negative for blurred vision.  Respiratory: Positive for cough, shortness of breath and wheezing. Negative for hemoptysis and sputum production.   Cardiovascular: Negative for chest pain, palpitations and leg swelling.       States he is not having leg swelling.   Gastrointestinal: Negative for abdominal pain, blood in stool, constipation, diarrhea and melena.  Genitourinary: Negative for dysuria.  Musculoskeletal: Negative for falls and joint pain.  Skin: Negative for itching and rash.  Neurological: Negative for loss of consciousness.  Endo/Heme/Allergies: Bruises/bleeds easily.  Psychiatric/Behavioral: Negative for depression and memory loss. The patient is not nervous/anxious.     Past Medical History:  Diagnosis Date  . Anxiety   . Arthritis   . Atypical chest pain    a. 05/2017 MV: no ischemia, EF 79%.  . Chest pain 10/20/2017  . Chronic diastolic CHF (congestive heart failure) ([place])    a. 01/2011 Echo: EF 50-55%, gr1 DD, mild AI, nl RV fxn, mild TR/PR; b. 10/2017 Echo: EF 60-65%, mild LVH, gr2 DD.  Marland Kitchen DDD (degenerative disc disease), cervical   . Depressive disorder, not elsewhere classified   . Difficult intubation   . Dysphagia, oral phase   . Dyspnea   . Edema   . Gallstones    a. Symptomatic - s/p lap chole 05/2018.  Marland Kitchen History of DVT (deep vein thrombosis)   .  History of kidney stones   . Hypertension   . Hypoxemia   . Impacted cerumen   . Ischemic colitis ([place])    a. 02/2018 GIB - colonoscopy w/ isch colitis. Anticoagulation resumed.  . Long term current use of anticoagulant 03/02/2011  . LOW BACK PAIN SYNDROME 03/17/2009   Qualifier: Diagnosis of  By: Council Mechanic MD, Hilaria Ota   . Mixed hyperlipidemia   . 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 Other myelopathy   . Pain in  limb   . Palpitations   . Paroxysmal Atrial Fibrillation ([place])    a. a. 01/2011 in setting of post-op complications including aspiration pna;  b. CHA2DS2VASc = 4--> Amio/Eliquis.  . Pneumonia 03/06/2003  . Spinal stenosis, unspecified region other than cervical   . 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 Syncope    a. 10/2017-->Event monitor: RSR, rare PACs/PVCs.  . Thoracic or lumbosacral neuritis or radiculitis, unspecified    Past Surgical History:  Procedure Laterality Date  . CARDIOVERSION N/A 06/20/2017   Procedure: CARDIOVERSION;  Surgeon: [name], MD;  Location: [facility] ORS;  Service: Cardiovascular;  Laterality: N/A;  . CATARACT EXTRACTION W/ INTRAOCULAR LENS  IMPLANT, BILATERAL    . CERVICAL FUSION  02/10/2011  . CHOLECYSTECTOMY  05/24/2018  . CHOLECYSTECTOMY N/A 05/24/2018   Procedure: LAPAROSCOPIC CHOLECYSTECTOMY;  Surgeon: Coralie Keens, MD;  Location: [place];  Service: General;  Laterality: N/A;  . COLONOSCOPY WITH PROPOFOL N/A 02/20/2018   Procedure: COLONOSCOPY WITH PROPOFOL;  Surgeon: Lucilla Lame, MD;  Location: [facility];  Service: Endoscopy;  Laterality: N/A;  . history of abd ultrasound  11/01   fatty liver  . MULTIPLE TOOTH EXTRACTIONS    . ORIF FIBULA FRACTURE Left 01/06/2017   Procedure: OPEN REDUCTION INTERNAL FIXATION (ORIF) FIBULA FRACTURE DISTAL FIBULA;  Surgeon: Melrose Nakayama, MD;  Location: [place];  Service: Orthopedics;  Laterality: Left;  Patient states has problems if he will have a tube in throat for Genera; Anesthesia   Social History:   reports that he has quit smoking. He quit after 1.00 year of use. He has never used smokeless tobacco. He reports that he does not drink alcohol or use drugs.  Family History  Problem Relation Age of Onset  . Stroke Father   . Atrial fibrillation Brother        on coumadin  . Heart disease Brother        AFib- coumadin  . Stroke Paternal Grandfather   .  Other Mother        hemorrhage  . Cancer Other        colon cancer at early age  . Prostate cancer Neg Hx   . Kidney cancer Neg Hx   . Bladder Cancer Neg Hx     Medications: Patient's Medications  New Prescriptions   No medications on file  Previous Medications   ALBUTEROL (PROVENTIL) (2.5 MG/3ML) 0.083% NEBULIZER SOLUTION    Take 3 mLs (2.5 mg total) by nebulization 2 (two) times daily as needed for up to 30 doses for wheezing or shortness of breath.   AMIODARONE (PACERONE) 200 MG TABLET    TAKE 1 TABLET BY MOUTH EVERY DAY   APIXABAN (ELIQUIS) 5 MG TABS TABLET    Take 1 tablet (5 mg total) by mouth 2 (two) times daily.   BENZONATATE (TESSALON) 200 MG CAPSULE    Take 1 capsule (200  mg total) by mouth at bedtime as needed for cough.   CETIRIZINE (ZYRTEC) 10 MG TABLET    TAKE 1 TABLET BY MOUTH EVERY DAY   CHOLECALCIFEROL (VITAMIN D) 1000 UNITS CAPSULE    Take one tablet once daily   FLUTICASONE FUROATE-VILANTEROL (BREO ELLIPTA) 100-25 MCG/INH AEPB    Inhale 1 puff into the lungs daily.   FUROSEMIDE (LASIX) 40 MG TABLET    Take 1 tablet (40 mg total) by mouth daily.   GABAPENTIN (NEURONTIN) 300 MG CAPSULE    Take 1 capsule (300 mg total) by mouth 2 (two) times daily.   METOPROLOL TARTRATE (LOPRESSOR) 25 MG TABLET    Take 1 tablet (25 mg total) by mouth 2 (two) times daily.   OXYGEN    Inhale 2 L into the lungs at bedtime.   PANTOPRAZOLE (PROTONIX) 40 MG TABLET    Take 1 tablet (40 mg total) by mouth at bedtime.   POTASSIUM CHLORIDE SA (K-DUR,KLOR-CON) 10 MEQ TABLET    Take 1 tablet (10 mEq total) by mouth daily.   PRAVASTATIN (PRAVACHOL) 80 MG TABLET    Take 1 tablet (80 mg total) by mouth daily.   TRAMADOL (ULTRAM) 50 MG TABLET    Take 1 tablet (50 mg total) by mouth every 6 (six) hours as needed.  Modified Medications   No medications on file  Discontinued Medications   No medications on file     Physical Exam:  Vitals:   05/01/19 0923 05/01/19 0955  BP: (!) 170/98 (!) 150/90   Pulse: 68   Temp: 98 F (36.7 C)   TempSrc: Oral   SpO2: 95%   Weight: 193 lb (87.5 kg)   Height: 5\' 6"  (1.676 m)    Body mass index is 31.15 kg/m.  Physical Exam Constitutional:      General: He is not in acute distress.    Appearance: He is well-developed.  HENT:     Head: Normocephalic and atraumatic.  Eyes:     Comments: glasses  Cardiovascular:     Rate and Rhythm: Normal rate and regular rhythm.     Heart sounds: Murmur present.  Pulmonary:     Effort: Pulmonary effort is normal. No respiratory distress.     Breath sounds: Wheezing (faint wheezing on end exp to right middle and lower lung) present.  Abdominal:     General: Abdomen is protuberant. Bowel sounds are normal.     Palpations: Abdomen is soft.  Musculoskeletal: Normal range of motion.     Right lower leg: Edema (1+) present.     Left lower leg: Edema (1+) present.     Comments: Uses rolling walker with skis  Skin:    General: Skin is warm and dry.  Neurological:     Mental Status: He is alert and oriented to person, place, and time.     Labs reviewed: Basic Metabolic Panel: Recent Labs    02/28/19 0059 03/01/19 0241 03/02/19 0222  NA 141 139 139  K 4.2 4.3 4.3  CL 112* 106 104  CO2 22 28 26   GLUCOSE 121* 111* 117*  BUN 20 22 25*  CREATININE 1.20 1.30* 1.31*  CALCIUM 8.2* 8.6* 8.9   Liver Function Tests: Recent Labs    02/27/19 0145 03/01/19 0241  AST 87* 86*  ALT 61* 67*  ALKPHOS 58 42  BILITOT 1.2 0.8  PROT 7.4 6.5  ALBUMIN 4.3 3.5   No results for input(s): LIPASE, AMYLASE in the last 8760 hours. No results  for input(s): AMMONIA in the last 8760 hours. CBC: Recent Labs    02/27/19 0145 02/28/19 0059 03/01/19 0241 03/02/19 0222  WBC 13.0* 11.2* 8.3 8.9  NEUTROABS 7.0  --   --   --   HGB 15.2 12.0* 12.8* 12.0*  HCT 47.4 38.2* 39.7 37.7*  MCV 100.0 100.0 100.5* 98.7  PLT 204 170 193 191   Lipid Panel: No results for input(s): CHOL, HDL, LDLCALC, TRIG, CHOLHDL,  LDLDIRECT in the last 8760 hours. TSH: No results for input(s): TSH in the last 8760 hours. A1C: Lab Results  Component Value Date   HGBA1C 5.7 (H) 05/16/2016     Assessment/Plan 1. Elevated LFTs -noted during hospitalization, will follow up lab.  - COMPLETE METABOLIC PANEL WITH GFR  2. Chronic diastolic CHF (congestive heart failure) (HCC) -weight slightly up from previous in office but without significant edema (some noted but question is if this baseline for him as he states he is not having any swelling), pt denies shortness of breath today and without increase work of breathing in office. He does not weight himself at home routinely and states he is compliant with his diuretic. Question dietary compliance with low sodium. Low sodium diet encouraged. Will follow up with lab and chest xray.  - DG Chest 2 View  3. Chronic respiratory failure with hypoxia (HCC) -continues on o2 at hs, hard to determine if shortness of breath is worse due to pt being a poor historian. Pt reports compliance with medications.  -will evaluate with labs and chest xray at this time.  - CBC with Differential/Platelet - Brain Natriuretic Peptide - DG Chest 2 View  4. Chronic cough Ongoing, tessalon pearls not effective. Ongoing follow up with pulmonary, likely multifactorial.  - CBC with Differential/Platelet - DG Chest 2 View  Next appt: 05/27/2019 with Dr Mariea Clonts for routine follow up, sooner if needed  Amorette Charrette K. [place], [facility] [phone]

## 2019-05-02 LAB — CBC WITH DIFFERENTIAL/PLATELET
Absolute Monocytes: 400 cells/uL (ref 200–950)
Basophils Absolute: 52 cells/uL (ref 0–200)
Basophils Relative: 1 %
Eosinophils Absolute: 187 cells/uL (ref 15–500)
Eosinophils Relative: 3.6 %
HCT: 43 % (ref 38.5–50.0)
Hemoglobin: 14.3 g/dL (ref 13.2–17.1)
Lymphs Abs: 1784 cells/uL (ref 850–3900)
MCH: 31.8 pg (ref 27.0–33.0)
MCHC: 33.3 g/dL (ref 32.0–36.0)
MCV: 95.8 fL (ref 80.0–100.0)
MPV: 10.2 fL (ref 7.5–12.5)
Monocytes Relative: 7.7 %
Neutro Abs: 2777 cells/uL (ref 1500–7800)
Neutrophils Relative %: 53.4 %
Platelets: 220 10*3/uL (ref 140–400)
RBC: 4.49 10*6/uL (ref 4.20–5.80)
RDW: 12.3 % (ref 11.0–15.0)
Total Lymphocyte: 34.3 %
WBC: 5.2 10*3/uL (ref 3.8–10.8)

## 2019-05-02 LAB — COMPLETE METABOLIC PANEL WITH GFR
AG Ratio: 1.8 (calc) (ref 1.0–2.5)
ALT: 55 U/L — ABNORMAL HIGH (ref 9–46)
AST: 69 U/L — ABNORMAL HIGH (ref 10–35)
Albumin: 4.5 g/dL (ref 3.6–5.1)
Alkaline phosphatase (APISO): 54 U/L (ref 35–144)
BUN/Creatinine Ratio: 18 (calc) (ref 6–22)
BUN: 20 mg/dL (ref 7–25)
CO2: 31 mmol/L (ref 20–32)
Calcium: 9.2 mg/dL (ref 8.6–10.3)
Chloride: 103 mmol/L (ref 98–110)
Creat: 1.13 mg/dL — ABNORMAL HIGH (ref 0.70–1.11)
GFR, Est African American: 69 mL/min/{1.73_m2} (ref >=60)
GFR, Est Non African American: 59 mL/min/{1.73_m2} — ABNORMAL LOW (ref >=60)
Globulin: 2.5 g/dL (calc) (ref 1.9–3.7)
Glucose, Bld: 88 mg/dL (ref 65–99)
Potassium: 4.2 mmol/L (ref 3.5–5.3)
Sodium: 141 mmol/L (ref 135–146)
Total Bilirubin: 0.9 mg/dL (ref 0.2–1.2)
Total Protein: 7 g/dL (ref 6.1–8.1)

## 2019-05-02 LAB — BRAIN NATRIURETIC PEPTIDE: Brain Natriuretic Peptide: 53 pg/mL (ref <100)

## 2019-05-03 ENCOUNTER — Telehealth: Payer: Self-pay

## 2019-05-03 NOTE — Telephone Encounter (Signed)
Spoke with Santiago Glad and she verbalized understanding of Dr.Reed's response.  Verbal given to re-certify patient

## 2019-05-03 NOTE — Telephone Encounter (Signed)
Santiago Glad with [facility] called and left message on clinical intake voicemail. Santiago Glad is requesting orders to re-certify patient for COPD management 1 x weekly for 9 week. Santiago Glad also mentioned that patient with chronic cough and tessalon pearls are not working, Santiago Glad thinks patient need a different treatment   Patient was seen on 05/01/2019 for cough and wheezing.  Please advise if any further action recommend to treat chronic cough

## 2019-05-03 NOTE — Telephone Encounter (Signed)
No, already discussed with Rodena Piety and with NP Eubanks.  He cannot take codeine so we cannot give him that type of cough med.  There were questions about lasix adherence so the thought is that some of it H[place] be heart failure related.  He needs to be compliant with taking his meds.  Please notify Santiago Glad of these items.  Thanks.

## 2019-05-08 DIAGNOSIS — I110 Hypertensive heart disease with heart failure: Secondary | ICD-10-CM | POA: Diagnosis not present

## 2019-05-08 DIAGNOSIS — J9611 Chronic respiratory failure with hypoxia: Secondary | ICD-10-CM

## 2019-05-08 DIAGNOSIS — J449 Chronic obstructive pulmonary disease, unspecified: Secondary | ICD-10-CM | POA: Diagnosis not present

## 2019-05-08 DIAGNOSIS — I5032 Chronic diastolic (congestive) heart failure: Secondary | ICD-10-CM | POA: Diagnosis not present

## 2019-05-08 DIAGNOSIS — I480 Paroxysmal atrial fibrillation: Secondary | ICD-10-CM

## 2019-05-08 DIAGNOSIS — M4722 Other spondylosis with radiculopathy, cervical region: Secondary | ICD-10-CM

## 2019-05-08 DIAGNOSIS — N183 Chronic kidney disease, stage 3 (moderate): Secondary | ICD-10-CM | POA: Diagnosis not present

## 2019-05-08 DIAGNOSIS — M4712 Other spondylosis with myelopathy, cervical region: Secondary | ICD-10-CM

## 2019-05-14 ENCOUNTER — Other Ambulatory Visit: Payer: Self-pay

## 2019-05-14 DIAGNOSIS — Z86718 Personal history of other venous thrombosis and embolism: Secondary | ICD-10-CM | POA: Diagnosis not present

## 2019-05-14 DIAGNOSIS — J449 Chronic obstructive pulmonary disease, unspecified: Secondary | ICD-10-CM | POA: Diagnosis not present

## 2019-05-14 DIAGNOSIS — F329 Major depressive disorder, single episode, unspecified: Secondary | ICD-10-CM | POA: Diagnosis not present

## 2019-05-14 DIAGNOSIS — M48062 Spinal stenosis, lumbar region with neurogenic claudication: Secondary | ICD-10-CM | POA: Diagnosis not present

## 2019-05-14 DIAGNOSIS — I110 Hypertensive heart disease with heart failure: Secondary | ICD-10-CM | POA: Diagnosis not present

## 2019-05-14 DIAGNOSIS — E559 Vitamin D deficiency, unspecified: Secondary | ICD-10-CM | POA: Diagnosis not present

## 2019-05-14 DIAGNOSIS — G629 Polyneuropathy, unspecified: Secondary | ICD-10-CM | POA: Diagnosis not present

## 2019-05-14 DIAGNOSIS — M5010 Cervical disc disorder with radiculopathy, unspecified cervical region: Secondary | ICD-10-CM | POA: Diagnosis not present

## 2019-05-14 DIAGNOSIS — M4712 Other spondylosis with myelopathy, cervical region: Secondary | ICD-10-CM | POA: Diagnosis not present

## 2019-05-14 DIAGNOSIS — M1991 Primary osteoarthritis, unspecified site: Secondary | ICD-10-CM | POA: Diagnosis not present

## 2019-05-14 DIAGNOSIS — K219 Gastro-esophageal reflux disease without esophagitis: Secondary | ICD-10-CM | POA: Diagnosis not present

## 2019-05-14 DIAGNOSIS — N183 Chronic kidney disease, stage 3 (moderate): Secondary | ICD-10-CM | POA: Diagnosis not present

## 2019-05-14 DIAGNOSIS — F419 Anxiety disorder, unspecified: Secondary | ICD-10-CM | POA: Diagnosis not present

## 2019-05-14 DIAGNOSIS — M4803 Spinal stenosis, cervicothoracic region: Secondary | ICD-10-CM | POA: Diagnosis not present

## 2019-05-14 DIAGNOSIS — J9611 Chronic respiratory failure with hypoxia: Secondary | ICD-10-CM | POA: Diagnosis not present

## 2019-05-14 DIAGNOSIS — I5032 Chronic diastolic (congestive) heart failure: Secondary | ICD-10-CM | POA: Diagnosis not present

## 2019-05-14 DIAGNOSIS — M5000 Cervical disc disorder with myelopathy, unspecified cervical region: Secondary | ICD-10-CM | POA: Diagnosis not present

## 2019-05-14 DIAGNOSIS — M4722 Other spondylosis with radiculopathy, cervical region: Secondary | ICD-10-CM | POA: Diagnosis not present

## 2019-05-14 DIAGNOSIS — Z85828 Personal history of other malignant neoplasm of skin: Secondary | ICD-10-CM | POA: Diagnosis not present

## 2019-05-14 DIAGNOSIS — Z9981 Dependence on supplemental oxygen: Secondary | ICD-10-CM | POA: Diagnosis not present

## 2019-05-14 DIAGNOSIS — I480 Paroxysmal atrial fibrillation: Secondary | ICD-10-CM | POA: Diagnosis not present

## 2019-05-14 DIAGNOSIS — Z7901 Long term (current) use of anticoagulants: Secondary | ICD-10-CM | POA: Diagnosis not present

## 2019-05-14 DIAGNOSIS — M21372 Foot drop, left foot: Secondary | ICD-10-CM | POA: Diagnosis not present

## 2019-05-14 DIAGNOSIS — E782 Mixed hyperlipidemia: Secondary | ICD-10-CM | POA: Diagnosis not present

## 2019-05-14 MED ORDER — APIXABAN 5 MG PO TABS: 5.0000 mg | ORAL_TABLET | Freq: Two times a day (BID) | ORAL | 6 refills | Status: DC

## 2019-05-22 DIAGNOSIS — J189 Pneumonia, unspecified organism: Secondary | ICD-10-CM | POA: Diagnosis not present

## 2019-05-22 DIAGNOSIS — J9601 Acute respiratory failure with hypoxia: Secondary | ICD-10-CM | POA: Diagnosis not present

## 2019-05-24 DIAGNOSIS — I5032 Chronic diastolic (congestive) heart failure: Secondary | ICD-10-CM | POA: Diagnosis not present

## 2019-05-24 DIAGNOSIS — R062 Wheezing: Secondary | ICD-10-CM | POA: Diagnosis not present

## 2019-05-27 ENCOUNTER — Other Ambulatory Visit: Payer: Self-pay

## 2019-05-27 ENCOUNTER — Ambulatory Visit (INDEPENDENT_AMBULATORY_CARE_PROVIDER_SITE_OTHER): Payer: PPO | Admitting: Internal Medicine

## 2019-05-27 ENCOUNTER — Encounter: Payer: Self-pay | Admitting: Internal Medicine

## 2019-05-27 VITALS — BP 140/80 | HR 66 | Temp 97.9°F | Ht 66.0 in | Wt 191.0 lb

## 2019-05-27 DIAGNOSIS — R945 Abnormal results of liver function studies: Secondary | ICD-10-CM

## 2019-05-27 DIAGNOSIS — E782 Mixed hyperlipidemia: Secondary | ICD-10-CM

## 2019-05-27 DIAGNOSIS — I480 Paroxysmal atrial fibrillation: Secondary | ICD-10-CM | POA: Diagnosis not present

## 2019-05-27 DIAGNOSIS — J9611 Chronic respiratory failure with hypoxia: Secondary | ICD-10-CM | POA: Diagnosis not present

## 2019-05-27 DIAGNOSIS — N183 Chronic kidney disease, stage 3 unspecified: Secondary | ICD-10-CM

## 2019-05-27 DIAGNOSIS — I5032 Chronic diastolic (congestive) heart failure: Secondary | ICD-10-CM | POA: Diagnosis not present

## 2019-05-27 DIAGNOSIS — R7989 Other specified abnormal findings of blood chemistry: Secondary | ICD-10-CM

## 2019-05-27 MED ORDER — PRAVASTATIN SODIUM 40 MG PO TABS: 40.0000 mg | ORAL_TABLET | Freq: Every day | ORAL | 3 refills | Status: DC

## 2019-05-27 NOTE — Progress Notes (Signed)
Location:  [facility] clinic Provider:  Dustyn Armbrister L. Mariea Clonts, D.O., C.M.D.  Code Status: need to discuss this with him--full code at this point Goals of Care:  Advanced Directives 03/15/2019  Does Patient Have a Medical Advance Directive? Yes  Type of Advance Directive Living will  Does patient want to make changes to medical advance directive? No - Patient declined  Copy of [place] in Chart? -  Would patient like information on creating a medical advance directive? -     Chief Complaint  Patient presents with  . Medical Management of Chronic Issues    4 week follow-up    HPI: Patient is a 85 y.o. male seen today for medical management of chronic diseases.    VS look good today.  The coughing is finally about to go away.  He says he owes that to a higher being.  Still has shortness of breath and says he still has the episodes of stopping breathing and starting back.  Weight is stable from mid Feb.  He does still have a little wheezing.  His home health nurse still heard that.    He hurts from his arthritis in his back.  Getting around with his walker.  No falls lately.  He is working with home health PT--he likes his therapist, Marjory Lies.    He is sleeping well.    He talks about his faith quite a bit today.    He does feel his heart race.  It's not uncomfortable and does not make him feel bad.    Allergies are doing ok.    Past Medical History:  Diagnosis Date  . Anxiety   . Arthritis   . Atypical chest pain    a. 05/2017 MV: no ischemia, EF 79%.  . Chest pain 10/20/2017  . Chronic diastolic CHF (congestive heart failure) ([place])    a. 01/2011 Echo: EF 50-55%, gr1 DD, mild AI, nl RV fxn, mild TR/PR; b. 10/2017 Echo: EF 60-65%, mild LVH, gr2 DD.  Marland Kitchen DDD (degenerative disc disease), cervical   . Depressive disorder, not elsewhere classified   . Difficult intubation   . Dysphagia, oral phase   . Dyspnea   . Edema   . Gallstones    a. Symptomatic - s/p lap chole  05/2018.  Marland Kitchen History of DVT (deep vein thrombosis)   . History of kidney stones   . Hypertension   . Hypoxemia   . Impacted cerumen   . Ischemic colitis ([place])    a. 02/2018 GIB - colonoscopy w/ isch colitis. Anticoagulation resumed.  . Long term current use of anticoagulant 03/02/2011  . LOW BACK PAIN SYNDROME 03/17/2009   Qualifier: Diagnosis of  By: Council Mechanic MD, Hilaria Ota   . Mixed hyperlipidemia   . 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 Other myelopathy   . Pain in limb   . Palpitations   . Paroxysmal Atrial Fibrillation ([place])    a. a. 01/2011 in setting of post-op complications including aspiration pna;  b. CHA2DS2VASc = 4--> Amio/Eliquis.  . Pneumonia 03/06/2003  . Spinal stenosis, unspecified region other than cervical   . 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 Syncope    a. 10/2017-->Event monitor: RSR, rare PACs/PVCs.  . Thoracic or lumbosacral neuritis or radiculitis, unspecified     Past Surgical History:  Procedure Laterality Date  . CARDIOVERSION N/A 06/20/2017   Procedure:  CARDIOVERSION;  Surgeon: [name], MD;  Location: [facility] ORS;  Service: Cardiovascular;  Laterality: N/A;  . CATARACT EXTRACTION W/ INTRAOCULAR LENS  IMPLANT, BILATERAL    . CERVICAL FUSION  02/10/2011  . CHOLECYSTECTOMY  05/24/2018  . CHOLECYSTECTOMY N/A 05/24/2018   Procedure: LAPAROSCOPIC CHOLECYSTECTOMY;  Surgeon: Coralie Keens, MD;  Location: [place];  Service: General;  Laterality: N/A;  . COLONOSCOPY WITH PROPOFOL N/A 02/20/2018   Procedure: COLONOSCOPY WITH PROPOFOL;  Surgeon: Lucilla Lame, MD;  Location: [facility];  Service: Endoscopy;  Laterality: N/A;  . history of abd ultrasound  11/01   fatty liver  . MULTIPLE TOOTH EXTRACTIONS    . ORIF FIBULA FRACTURE Left 01/06/2017   Procedure: OPEN REDUCTION INTERNAL FIXATION (ORIF) FIBULA FRACTURE DISTAL FIBULA;  Surgeon: Melrose Nakayama, MD;  Location: [place];   Service: Orthopedics;  Laterality: Left;  Patient states has problems if he will have a tube in throat for Genera; Anesthesia    Allergies  Allergen Reactions  . Morphine And Related Shortness Of Breath  . Percocet [Oxycodone-Acetaminophen] Shortness Of Breath  . Valium Shortness Of Breath    Outpatient Encounter Medications as of 05/27/2019  Medication Sig  . albuterol (PROVENTIL) (2.5 MG/3ML) 0.083% nebulizer solution Take 3 mLs (2.5 mg total) by nebulization 2 (two) times daily as needed for up to 30 doses for wheezing or shortness of breath.  Marland Kitchen amiodarone (PACERONE) 200 MG tablet TAKE 1 TABLET BY MOUTH EVERY DAY  . apixaban (ELIQUIS) 5 MG TABS tablet Take 1 tablet (5 mg total) by mouth 2 (two) times daily.  . benzonatate (TESSALON) 200 MG capsule Take 1 capsule (200 mg total) by mouth at bedtime as needed for cough.  . cetirizine (ZYRTEC) 10 MG tablet TAKE 1 TABLET BY MOUTH EVERY DAY  . Cholecalciferol (VITAMIN D) 1000 UNITS capsule Take one tablet once daily  . fluticasone furoate-vilanterol (BREO ELLIPTA) 100-25 MCG/INH AEPB Inhale 1 puff into the lungs daily.  . furosemide (LASIX) 40 MG tablet Take 1 tablet (40 mg total) by mouth daily.  Marland Kitchen gabapentin (NEURONTIN) 300 MG capsule Take 1 capsule (300 mg total) by mouth 2 (two) times daily.  . metoprolol tartrate (LOPRESSOR) 25 MG tablet Take 1 tablet (25 mg total) by mouth 2 (two) times daily.  . OXYGEN Inhale 2 L into the lungs at bedtime.  . pantoprazole (PROTONIX) 40 MG tablet Take 1 tablet (40 mg total) by mouth at bedtime.  . potassium chloride SA (K-DUR,KLOR-CON) 10 MEQ tablet Take 1 tablet (10 mEq total) by mouth daily.  . pravastatin (PRAVACHOL) 80 MG tablet Take 1 tablet (80 mg total) by mouth daily.  . traMADol (ULTRAM) 50 MG tablet Take 1 tablet (50 mg total) by mouth every 6 (six) hours as needed.   No facility-administered encounter medications on file as of 05/27/2019.     Review of Systems:  Review of Systems    Constitutional: Negative for chills, fever and malaise/fatigue.  HENT: Positive for hearing loss. Negative for congestion.   Eyes: Negative for blurred vision.       Glasses  Respiratory: Positive for cough, shortness of breath and wheezing.   Cardiovascular: Positive for leg swelling. Negative for chest pain and palpitations.  Gastrointestinal: Negative for abdominal pain.  Genitourinary: Negative for dysuria.  Musculoskeletal: Positive for back pain. Negative for falls.  Skin: Negative for itching and rash.       Thick toenails in need of trimming  Neurological: Negative for dizziness and loss of consciousness.  Endo/Heme/Allergies: Bruises/bleeds easily.  Psychiatric/Behavioral: Negative for depression and memory loss. The patient is not nervous/anxious and does not have insomnia.     Health Maintenance  Topic Date Due  . INFLUENZA VACCINE  07/13/2019  . COLONOSCOPY  02/20/2021  . TETANUS/TDAP  10/15/2023  . PNA vac Low Risk Adult  Completed    Physical Exam: Vitals:   05/27/19 0916  BP: 140/80  Pulse: 66  Temp: 97.9 F (36.6 C)  TempSrc: Oral  SpO2: 94%  Weight: 191 lb (86.6 kg)  Height: 5\' 6"  (1.676 m)   Body mass index is 30.83 kg/m. Physical Exam Vitals signs reviewed.  Constitutional:      General: He is not in acute distress.    Appearance: Normal appearance. He is not toxic-appearing.     Comments: Short of breath when taking his socks off and putting them back on--led to coughing spell and audible upper airway wheezing  HENT:     Head: Normocephalic and atraumatic.  Cardiovascular:     Rate and Rhythm: Rhythm irregular.     Heart sounds: No murmur.  Pulmonary:     Effort: Pulmonary effort is normal.     Breath sounds: Normal breath sounds. No wheezing, rhonchi or rales.  Abdominal:     General: Bowel sounds are normal.  Musculoskeletal:     Right lower leg: Edema present.     Left lower leg: Edema present.  Skin:    General: Skin is warm and dry.   Neurological:     General: No focal deficit present.     Mental Status: He is alert and oriented to person, place, and time.  Psychiatric:        Mood and Affect: Mood normal.     Labs reviewed: Basic Metabolic Panel: Recent Labs    03/01/19 0241 03/02/19 0222 05/01/19 1011  NA 139 139 141  K 4.3 4.3 4.2  CL 106 104 103  CO2 28 26 31   GLUCOSE 111* 117* 88  BUN 22 25* 20  CREATININE 1.30* 1.31* 1.13*  CALCIUM 8.6* 8.9 9.2   Liver Function Tests: Recent Labs    02/27/19 0145 03/01/19 0241 05/01/19 1011  AST 87* 86* 69*  ALT 61* 67* 55*  ALKPHOS 58 42  --   BILITOT 1.2 0.8 0.9  PROT 7.4 6.5 7.0  ALBUMIN 4.3 3.5  --    No results for input(s): LIPASE, AMYLASE in the last 8760 hours. No results for input(s): AMMONIA in the last 8760 hours. CBC: Recent Labs    02/27/19 0145  03/01/19 0241 03/02/19 0222 05/01/19 1011  WBC 13.0*   < > 8.3 8.9 5.2  NEUTROABS 7.0  --   --   --  2,777  HGB 15.2   < > 12.8* 12.0* 14.3  HCT 47.4   < > 39.7 37.7* 43.0  MCV 100.0   < > 100.5* 98.7 95.8  PLT 204   < > 193 191 220   < > = values in this interval not displayed.   Lipid Panel: No results for input(s): CHOL, HDL, LDLCALC, TRIG, CHOLHDL, LDLDIRECT in the last 8760 hours. Lab Results  Component Value Date   HGBA1C 5.7 (H) 05/16/2016    Procedures since last visit: Dg Chest 2 View  Result Date: 05/01/2019 CLINICAL DATA:  Chronic diastolic congestive heart failure. Hypoxia and cough EXAM: CHEST - 2 VIEW COMPARISON:  02/27/2019 FINDINGS: Heart size upper normal. Vascularity normal. Negative for edema or effusion. Elevated right hemidiaphragm with  mild right lower lobe atelectasis unchanged. IMPRESSION: Mild right lower lobe atelectasis unchanged. Negative for heart failure. Electronically Signed   By: Franchot Gallo M.D.   On: 05/01/2019 16:03    Assessment/Plan 1. Chronic diastolic CHF (congestive heart failure) (HCC) -continue lasix and potassium as per  cardiology -weight stable, has some chronic dyspnea which is due to mix of this and his COPD  2. Elevated LFTs - has fatty liver diagnosis with prior US/imaging amid his cholecystitis episode - NP had asked for him to reduce pravachol which was not done due to it being a "horse pill" - COMPLETE METABOLIC PANEL WITH GFR - pravastatin (PRAVACHOL) 40 MG tablet; Take 1 tablet (40 mg total) by mouth daily.  Dispense: 90 tablet; Refill: 3  3. Chronic respiratory failure with hypoxia (HCC) - from COPD--continues on breo, albuterol nebs, tessalon prn cough (reports cough gone due to [employer]) -also from his chf--weight stable there, but still has edema--not using compression socks  4. Paroxysmal atrial fibrillation (HCC) - continues on amiodarone, eliquis and metoprolol--reports occasional palpitations, but not persistent or bothersome  5. Mixed hyperlipidemia - pravastatin was to be reduced to 40mg  from 80mg  after last labs with increased liver panel - Lipid panel - pravastatin (PRAVACHOL) 40 MG tablet; Take 1 tablet (40 mg total) by mouth daily.  Dispense: 90 tablet; Refill: 3  6. CKD (chronic kidney disease), stage III ([place]) -has been stable lately, f/u cmp today   Labs/tests ordered:  Cmp, flp today as not done before  Next appt:  4 mos med mgt   Raider Valbuena L. Da Authement, D.O. [facility] [address], [place] [address] Cell Phone (Mon-Fri 8am-5pm):  [phone] On Call:  [phone] & follow prompts after 5pm & weekends Office Phone:  [phone] Office Fax:  [phone]

## 2019-05-28 DIAGNOSIS — Z86718 Personal history of other venous thrombosis and embolism: Secondary | ICD-10-CM | POA: Diagnosis not present

## 2019-05-28 DIAGNOSIS — J449 Chronic obstructive pulmonary disease, unspecified: Secondary | ICD-10-CM | POA: Diagnosis not present

## 2019-05-28 DIAGNOSIS — M5000 Cervical disc disorder with myelopathy, unspecified cervical region: Secondary | ICD-10-CM | POA: Diagnosis not present

## 2019-05-28 DIAGNOSIS — M4803 Spinal stenosis, cervicothoracic region: Secondary | ICD-10-CM | POA: Diagnosis not present

## 2019-05-28 DIAGNOSIS — M1991 Primary osteoarthritis, unspecified site: Secondary | ICD-10-CM | POA: Diagnosis not present

## 2019-05-28 DIAGNOSIS — K219 Gastro-esophageal reflux disease without esophagitis: Secondary | ICD-10-CM | POA: Diagnosis not present

## 2019-05-28 DIAGNOSIS — Z9981 Dependence on supplemental oxygen: Secondary | ICD-10-CM | POA: Diagnosis not present

## 2019-05-28 DIAGNOSIS — E782 Mixed hyperlipidemia: Secondary | ICD-10-CM | POA: Diagnosis not present

## 2019-05-28 DIAGNOSIS — I110 Hypertensive heart disease with heart failure: Secondary | ICD-10-CM | POA: Diagnosis not present

## 2019-05-28 DIAGNOSIS — M4722 Other spondylosis with radiculopathy, cervical region: Secondary | ICD-10-CM | POA: Diagnosis not present

## 2019-05-28 DIAGNOSIS — N183 Chronic kidney disease, stage 3 (moderate): Secondary | ICD-10-CM | POA: Diagnosis not present

## 2019-05-28 DIAGNOSIS — M48062 Spinal stenosis, lumbar region with neurogenic claudication: Secondary | ICD-10-CM | POA: Diagnosis not present

## 2019-05-28 DIAGNOSIS — E559 Vitamin D deficiency, unspecified: Secondary | ICD-10-CM | POA: Diagnosis not present

## 2019-05-28 DIAGNOSIS — I5032 Chronic diastolic (congestive) heart failure: Secondary | ICD-10-CM | POA: Diagnosis not present

## 2019-05-28 DIAGNOSIS — Z85828 Personal history of other malignant neoplasm of skin: Secondary | ICD-10-CM | POA: Diagnosis not present

## 2019-05-28 DIAGNOSIS — M21372 Foot drop, left foot: Secondary | ICD-10-CM | POA: Diagnosis not present

## 2019-05-28 DIAGNOSIS — G629 Polyneuropathy, unspecified: Secondary | ICD-10-CM | POA: Diagnosis not present

## 2019-05-28 DIAGNOSIS — I480 Paroxysmal atrial fibrillation: Secondary | ICD-10-CM | POA: Diagnosis not present

## 2019-05-28 DIAGNOSIS — Z7901 Long term (current) use of anticoagulants: Secondary | ICD-10-CM | POA: Diagnosis not present

## 2019-05-28 DIAGNOSIS — M4712 Other spondylosis with myelopathy, cervical region: Secondary | ICD-10-CM | POA: Diagnosis not present

## 2019-05-28 DIAGNOSIS — F419 Anxiety disorder, unspecified: Secondary | ICD-10-CM | POA: Diagnosis not present

## 2019-05-28 DIAGNOSIS — F329 Major depressive disorder, single episode, unspecified: Secondary | ICD-10-CM | POA: Diagnosis not present

## 2019-05-28 DIAGNOSIS — M5010 Cervical disc disorder with radiculopathy, unspecified cervical region: Secondary | ICD-10-CM | POA: Diagnosis not present

## 2019-05-28 DIAGNOSIS — J9611 Chronic respiratory failure with hypoxia: Secondary | ICD-10-CM | POA: Diagnosis not present

## 2019-05-28 LAB — COMPLETE METABOLIC PANEL WITH GFR
AG Ratio: 1.6 (calc) (ref 1.0–2.5)
ALT: 51 U/L — ABNORMAL HIGH (ref 9–46)
AST: 63 U/L — ABNORMAL HIGH (ref 10–35)
Albumin: 4.5 g/dL (ref 3.6–5.1)
Alkaline phosphatase (APISO): 56 U/L (ref 35–144)
BUN/Creatinine Ratio: 15 (calc) (ref 6–22)
BUN: 22 mg/dL (ref 7–25)
CO2: 27 mmol/L (ref 20–32)
Calcium: 9.6 mg/dL (ref 8.6–10.3)
Chloride: 103 mmol/L (ref 98–110)
Creat: 1.45 mg/dL — ABNORMAL HIGH (ref 0.70–1.11)
GFR, Est African American: 51 mL/min/{1.73_m2} — ABNORMAL LOW (ref >=60)
GFR, Est Non African American: 44 mL/min/{1.73_m2} — ABNORMAL LOW (ref >=60)
Globulin: 2.8 g/dL (calc) (ref 1.9–3.7)
Glucose, Bld: 91 mg/dL (ref 65–99)
Potassium: 4.6 mmol/L (ref 3.5–5.3)
Sodium: 141 mmol/L (ref 135–146)
Total Bilirubin: 0.8 mg/dL (ref 0.2–1.2)
Total Protein: 7.3 g/dL (ref 6.1–8.1)

## 2019-05-28 LAB — LIPID PANEL
Cholesterol: 139 mg/dL (ref <200)
HDL: 35 mg/dL — ABNORMAL LOW (ref >=40)
LDL Cholesterol (Calc): 72 mg/dL (calc)
Non-HDL Cholesterol (Calc): 104 mg/dL (calc) (ref <130)
Total CHOL/HDL Ratio: 4 (calc) (ref <5.0)
Triglycerides: 228 mg/dL — ABNORMAL HIGH (ref <150)

## 2019-06-05 ENCOUNTER — Other Ambulatory Visit: Payer: Self-pay

## 2019-06-05 MED ORDER — CETIRIZINE HCL 10 MG PO TABS: 10.0000 mg | ORAL_TABLET | Freq: Every day | ORAL | 0 refills | Status: DC

## 2019-06-10 ENCOUNTER — Telehealth: Payer: Self-pay | Admitting: Pulmonary Disease

## 2019-06-10 NOTE — Telephone Encounter (Signed)
Called patient for COVID-19 pre-screening for in office visit. ° °Have you recently traveled any where out of the local area in the last 2 weeks? No ° °Have you been in close contact with a person diagnosed with COVID-19 or someone awaiting results within the last 2 weeks? No ° °Do you currently have any of the following symptoms? If so, when did they start? °Cough     Diarrhea   Joint Pain °Fever      Muscle Pain   Red eyes °Shortness of breath   Abdominal pain  Vomiting °Loss of smell    Rash    Sore Throat °Headache    Weakness   Bruising or bleeding ° ° °Okay to proceed with visit 06/11/2019 °  ° ° °

## 2019-06-11 ENCOUNTER — Encounter: Payer: Self-pay | Admitting: Pulmonary Disease

## 2019-06-11 ENCOUNTER — Ambulatory Visit: Payer: PPO | Admitting: Pulmonary Disease

## 2019-06-11 ENCOUNTER — Other Ambulatory Visit: Payer: Self-pay

## 2019-06-11 VITALS — BP 138/78 | HR 68 | Temp 98.0°F | Ht 66.0 in | Wt 190.4 lb

## 2019-06-11 DIAGNOSIS — J449 Chronic obstructive pulmonary disease, unspecified: Secondary | ICD-10-CM

## 2019-06-11 DIAGNOSIS — J310 Chronic rhinitis: Secondary | ICD-10-CM | POA: Diagnosis not present

## 2019-06-11 DIAGNOSIS — R053 Chronic cough: Secondary | ICD-10-CM

## 2019-06-11 DIAGNOSIS — R05 Cough: Secondary | ICD-10-CM

## 2019-06-11 NOTE — Patient Instructions (Signed)
Continue Breo inhaler, 1 inhalation daily.  Rinse mouth after use  Continue nebulized albuterol as needed for increased shortness of breath, wheezing, chest tightness, cough  Follow-up in 3-4 months or sooner as needed

## 2019-06-11 NOTE — Progress Notes (Signed)
PULMONARY OFFICE FOLLOW-UP NOTE  Requesting MD/Service: Rockey Situ Date of initial consultation: 09/18/17 Reason for consultation: Chronic cough  PT PROFILE: 85 y.o. male with minimal very remote smoking history referred for evaluation of chronic cough   DATA: 02/13/11 CT chest: Bibasilar atelectasis. No other significant findings 10/19/17 hospitalized for syncope.  Noted to have paroxysmal atrial fibrillation.  Discharged home on nocturnal oxygen. 11/12/17 CTAP: No acute intra-abdominal or pelvic pathology  INTERVAL: Last encounter was a virtual visit over the telephone on 03/11/2019.  At that time, he had recently been hospitalized with an exacerbation of COPD due to rhinovirus.   SUBJ:  This is a scheduled follow-up.  Seen by primary physician 05/01/2019 at recommendation of home health nurse to evaluate "wheezing".  At the present time, he has no new complaints.  He continues to have exertional dyspnea and generalized weakness.  However, he states that he is not limited by these factors.  He is working with physical therapy.  He walks with a walker.  His cough is improved.  He remains on Breo inhaler and Flonase inhaler.  Overall he thinks he is "some better".  He has chronic LE edema which is unchanged from previously.  Otherwise, he denies CP, fever, purulent sputum, hemoptysis and calf tenderness.   Vitals:   06/11/19 0924  BP: 138/78  Pulse: 68  Temp: 98 F (36.7 C)  SpO2: 94%  Weight: 190 lb 6.4 oz (86.4 kg)  Height: 5\' 6"  (1.676 m)  Room air   EXAM:  Gen: Frail, no respiratory distress HEENT: NCAT, sclerae white Neck: No JVD Lungs: breath sounds slightly coarse without wheezes or other adventitious sounds Cardiovascular: RRR, no murmurs Abdomen: Soft, nontender, normal BS Ext: without clubbing, cyanosis, edema Neuro: grossly intact Skin: Limited exam, no lesions noted    DATA:   BMP Latest Ref Rng & Units 05/27/2019 05/01/2019 03/02/2019  Glucose 65 - 99 mg/dL 91 88  117(H)  BUN 7 - 25 mg/dL 22 20 25(H)  Creatinine 0.70 - 1.11 mg/dL 1.45(H) 1.13(H) 1.31(H)  BUN/Creat Ratio 6 - 22 (calc) 15 18 -  Sodium 135 - 146 mmol/L 141 141 139  Potassium 3.5 - 5.3 mmol/L 4.6 4.2 4.3  Chloride 98 - 110 mmol/L 103 103 104  CO2 20 - 32 mmol/L 27 31 26   Calcium 8.6 - 10.3 mg/dL 9.6 9.2 8.9    CBC Latest Ref Rng & Units 05/01/2019 03/02/2019 03/01/2019  WBC 3.8 - 10.8 Thousand/uL 5.2 8.9 8.3  Hemoglobin 13.2 - 17.1 g/dL 14.3 12.0(L) 12.8(L)  Hematocrit 38.5 - 50.0 % 43.0 37.7(L) 39.7  Platelets 140 - 400 Thousand/uL 220 191 193    CXR 05/01/19: Elevation of R hemidiaphragm (versus eventration) with no acute findings   IMPRESSION:   No diagnosis found. Previously I raised concern for possible diaphragmatic dysfunction but exam today seems to suggest that his diaphragms appear to be functioning.  He is more profoundly limited by lower extremity weakness than by dyspnea.  I raise the concern for a possible progressive neurodegenerative disorder accounting for both weakness and dyspnea.  PLAN:  Change Flonase to 1 spray per nostril daily Cont Breo inhaler, 1 inhalation daily.  Rinse mouth after use Continue nebulized albuterol as needed for increased shortness of breath, wheezing, chest tightness, cough Follow-up in 4 months.  Call sooner if needed   Merton Border, MD PCCM service Mobile [phone] Pager [phone] 06/11/2019 9:52 AM

## 2019-06-18 DIAGNOSIS — F419 Anxiety disorder, unspecified: Secondary | ICD-10-CM | POA: Diagnosis not present

## 2019-06-18 DIAGNOSIS — E559 Vitamin D deficiency, unspecified: Secondary | ICD-10-CM | POA: Diagnosis not present

## 2019-06-18 DIAGNOSIS — Z7901 Long term (current) use of anticoagulants: Secondary | ICD-10-CM | POA: Diagnosis not present

## 2019-06-18 DIAGNOSIS — N183 Chronic kidney disease, stage 3 (moderate): Secondary | ICD-10-CM | POA: Diagnosis not present

## 2019-06-18 DIAGNOSIS — E782 Mixed hyperlipidemia: Secondary | ICD-10-CM | POA: Diagnosis not present

## 2019-06-18 DIAGNOSIS — J449 Chronic obstructive pulmonary disease, unspecified: Secondary | ICD-10-CM | POA: Diagnosis not present

## 2019-06-18 DIAGNOSIS — I480 Paroxysmal atrial fibrillation: Secondary | ICD-10-CM | POA: Diagnosis not present

## 2019-06-18 DIAGNOSIS — Z85828 Personal history of other malignant neoplasm of skin: Secondary | ICD-10-CM | POA: Diagnosis not present

## 2019-06-18 DIAGNOSIS — M1991 Primary osteoarthritis, unspecified site: Secondary | ICD-10-CM | POA: Diagnosis not present

## 2019-06-18 DIAGNOSIS — M4712 Other spondylosis with myelopathy, cervical region: Secondary | ICD-10-CM | POA: Diagnosis not present

## 2019-06-18 DIAGNOSIS — M4722 Other spondylosis with radiculopathy, cervical region: Secondary | ICD-10-CM | POA: Diagnosis not present

## 2019-06-18 DIAGNOSIS — K219 Gastro-esophageal reflux disease without esophagitis: Secondary | ICD-10-CM | POA: Diagnosis not present

## 2019-06-18 DIAGNOSIS — J9611 Chronic respiratory failure with hypoxia: Secondary | ICD-10-CM | POA: Diagnosis not present

## 2019-06-18 DIAGNOSIS — Z86718 Personal history of other venous thrombosis and embolism: Secondary | ICD-10-CM | POA: Diagnosis not present

## 2019-06-18 DIAGNOSIS — M5010 Cervical disc disorder with radiculopathy, unspecified cervical region: Secondary | ICD-10-CM | POA: Diagnosis not present

## 2019-06-18 DIAGNOSIS — M21372 Foot drop, left foot: Secondary | ICD-10-CM | POA: Diagnosis not present

## 2019-06-18 DIAGNOSIS — M4803 Spinal stenosis, cervicothoracic region: Secondary | ICD-10-CM | POA: Diagnosis not present

## 2019-06-18 DIAGNOSIS — I110 Hypertensive heart disease with heart failure: Secondary | ICD-10-CM | POA: Diagnosis not present

## 2019-06-18 DIAGNOSIS — Z9981 Dependence on supplemental oxygen: Secondary | ICD-10-CM | POA: Diagnosis not present

## 2019-06-18 DIAGNOSIS — I5032 Chronic diastolic (congestive) heart failure: Secondary | ICD-10-CM | POA: Diagnosis not present

## 2019-06-18 DIAGNOSIS — G629 Polyneuropathy, unspecified: Secondary | ICD-10-CM | POA: Diagnosis not present

## 2019-06-18 DIAGNOSIS — M48062 Spinal stenosis, lumbar region with neurogenic claudication: Secondary | ICD-10-CM | POA: Diagnosis not present

## 2019-06-18 DIAGNOSIS — F329 Major depressive disorder, single episode, unspecified: Secondary | ICD-10-CM | POA: Diagnosis not present

## 2019-06-18 DIAGNOSIS — M5000 Cervical disc disorder with myelopathy, unspecified cervical region: Secondary | ICD-10-CM | POA: Diagnosis not present

## 2019-06-21 ENCOUNTER — Other Ambulatory Visit: Payer: Self-pay | Admitting: Cardiovascular Disease

## 2019-06-21 ENCOUNTER — Other Ambulatory Visit: Payer: Self-pay | Admitting: Nurse Practitioner

## 2019-06-21 DIAGNOSIS — J9601 Acute respiratory failure with hypoxia: Secondary | ICD-10-CM | POA: Diagnosis not present

## 2019-06-21 DIAGNOSIS — J189 Pneumonia, unspecified organism: Secondary | ICD-10-CM | POA: Diagnosis not present

## 2019-06-23 DIAGNOSIS — I5032 Chronic diastolic (congestive) heart failure: Secondary | ICD-10-CM | POA: Diagnosis not present

## 2019-06-23 DIAGNOSIS — R062 Wheezing: Secondary | ICD-10-CM | POA: Diagnosis not present

## 2019-06-25 ENCOUNTER — Other Ambulatory Visit: Payer: Self-pay

## 2019-06-25 MED ORDER — ALBUTEROL SULFATE (2.5 MG/3ML) 0.083% IN NEBU: 2.5000 mg | INHALATION_SOLUTION | Freq: Two times a day (BID) | RESPIRATORY_TRACT | 3 refills | Status: DC | PRN

## 2019-06-27 DIAGNOSIS — G629 Polyneuropathy, unspecified: Secondary | ICD-10-CM | POA: Diagnosis not present

## 2019-06-27 DIAGNOSIS — E559 Vitamin D deficiency, unspecified: Secondary | ICD-10-CM | POA: Diagnosis not present

## 2019-06-27 DIAGNOSIS — M1991 Primary osteoarthritis, unspecified site: Secondary | ICD-10-CM | POA: Diagnosis not present

## 2019-06-27 DIAGNOSIS — M48062 Spinal stenosis, lumbar region with neurogenic claudication: Secondary | ICD-10-CM | POA: Diagnosis not present

## 2019-06-27 DIAGNOSIS — Z9981 Dependence on supplemental oxygen: Secondary | ICD-10-CM | POA: Diagnosis not present

## 2019-06-27 DIAGNOSIS — K219 Gastro-esophageal reflux disease without esophagitis: Secondary | ICD-10-CM | POA: Diagnosis not present

## 2019-06-27 DIAGNOSIS — N183 Chronic kidney disease, stage 3 (moderate): Secondary | ICD-10-CM | POA: Diagnosis not present

## 2019-06-27 DIAGNOSIS — M5010 Cervical disc disorder with radiculopathy, unspecified cervical region: Secondary | ICD-10-CM | POA: Diagnosis not present

## 2019-06-27 DIAGNOSIS — F329 Major depressive disorder, single episode, unspecified: Secondary | ICD-10-CM | POA: Diagnosis not present

## 2019-06-27 DIAGNOSIS — F419 Anxiety disorder, unspecified: Secondary | ICD-10-CM | POA: Diagnosis not present

## 2019-06-27 DIAGNOSIS — Z86718 Personal history of other venous thrombosis and embolism: Secondary | ICD-10-CM | POA: Diagnosis not present

## 2019-06-27 DIAGNOSIS — E782 Mixed hyperlipidemia: Secondary | ICD-10-CM | POA: Diagnosis not present

## 2019-06-27 DIAGNOSIS — M4803 Spinal stenosis, cervicothoracic region: Secondary | ICD-10-CM | POA: Diagnosis not present

## 2019-06-27 DIAGNOSIS — J9611 Chronic respiratory failure with hypoxia: Secondary | ICD-10-CM | POA: Diagnosis not present

## 2019-06-27 DIAGNOSIS — M21372 Foot drop, left foot: Secondary | ICD-10-CM | POA: Diagnosis not present

## 2019-06-27 DIAGNOSIS — M4712 Other spondylosis with myelopathy, cervical region: Secondary | ICD-10-CM | POA: Diagnosis not present

## 2019-06-27 DIAGNOSIS — M4722 Other spondylosis with radiculopathy, cervical region: Secondary | ICD-10-CM | POA: Diagnosis not present

## 2019-06-27 DIAGNOSIS — Z85828 Personal history of other malignant neoplasm of skin: Secondary | ICD-10-CM | POA: Diagnosis not present

## 2019-06-27 DIAGNOSIS — I5032 Chronic diastolic (congestive) heart failure: Secondary | ICD-10-CM | POA: Diagnosis not present

## 2019-06-27 DIAGNOSIS — I110 Hypertensive heart disease with heart failure: Secondary | ICD-10-CM | POA: Diagnosis not present

## 2019-06-27 DIAGNOSIS — J449 Chronic obstructive pulmonary disease, unspecified: Secondary | ICD-10-CM | POA: Diagnosis not present

## 2019-06-27 DIAGNOSIS — Z7901 Long term (current) use of anticoagulants: Secondary | ICD-10-CM | POA: Diagnosis not present

## 2019-06-27 DIAGNOSIS — M5000 Cervical disc disorder with myelopathy, unspecified cervical region: Secondary | ICD-10-CM | POA: Diagnosis not present

## 2019-06-27 DIAGNOSIS — I480 Paroxysmal atrial fibrillation: Secondary | ICD-10-CM | POA: Diagnosis not present

## 2019-07-17 ENCOUNTER — Other Ambulatory Visit: Payer: Self-pay

## 2019-07-17 MED ORDER — APIXABAN 5 MG PO TABS: 5.0000 mg | ORAL_TABLET | Freq: Two times a day (BID) | ORAL | 6 refills | Status: DC

## 2019-07-22 DIAGNOSIS — J9601 Acute respiratory failure with hypoxia: Secondary | ICD-10-CM | POA: Diagnosis not present

## 2019-07-22 DIAGNOSIS — J189 Pneumonia, unspecified organism: Secondary | ICD-10-CM | POA: Diagnosis not present

## 2019-07-24 DIAGNOSIS — R062 Wheezing: Secondary | ICD-10-CM | POA: Diagnosis not present

## 2019-07-24 DIAGNOSIS — I5032 Chronic diastolic (congestive) heart failure: Secondary | ICD-10-CM | POA: Diagnosis not present

## 2019-08-22 DIAGNOSIS — J189 Pneumonia, unspecified organism: Secondary | ICD-10-CM | POA: Diagnosis not present

## 2019-08-22 DIAGNOSIS — J9601 Acute respiratory failure with hypoxia: Secondary | ICD-10-CM | POA: Diagnosis not present

## 2019-08-24 DIAGNOSIS — R062 Wheezing: Secondary | ICD-10-CM | POA: Diagnosis not present

## 2019-08-24 DIAGNOSIS — I5032 Chronic diastolic (congestive) heart failure: Secondary | ICD-10-CM | POA: Diagnosis not present

## 2019-09-10 ENCOUNTER — Other Ambulatory Visit: Payer: Self-pay | Admitting: *Deleted

## 2019-09-10 MED ORDER — CETIRIZINE HCL 10 MG PO TABS: 10.0000 mg | ORAL_TABLET | Freq: Every day | ORAL | 0 refills | Status: DC

## 2019-09-21 DIAGNOSIS — J189 Pneumonia, unspecified organism: Secondary | ICD-10-CM | POA: Diagnosis not present

## 2019-09-21 DIAGNOSIS — J9601 Acute respiratory failure with hypoxia: Secondary | ICD-10-CM | POA: Diagnosis not present

## 2019-09-23 DIAGNOSIS — I5032 Chronic diastolic (congestive) heart failure: Secondary | ICD-10-CM | POA: Diagnosis not present

## 2019-09-23 DIAGNOSIS — R062 Wheezing: Secondary | ICD-10-CM | POA: Diagnosis not present

## 2019-10-11 ENCOUNTER — Ambulatory Visit (INDEPENDENT_AMBULATORY_CARE_PROVIDER_SITE_OTHER): Payer: PPO | Admitting: Nurse Practitioner

## 2019-10-11 ENCOUNTER — Other Ambulatory Visit: Payer: Self-pay

## 2019-10-11 ENCOUNTER — Encounter: Payer: Self-pay | Admitting: Nurse Practitioner

## 2019-10-11 VITALS — BP 126/72 | HR 61 | Temp 98.0°F | Ht 66.0 in | Wt 195.8 lb

## 2019-10-11 DIAGNOSIS — I480 Paroxysmal atrial fibrillation: Secondary | ICD-10-CM

## 2019-10-11 DIAGNOSIS — Z23 Encounter for immunization: Secondary | ICD-10-CM | POA: Diagnosis not present

## 2019-10-11 DIAGNOSIS — R7989 Other specified abnormal findings of blood chemistry: Secondary | ICD-10-CM

## 2019-10-11 DIAGNOSIS — I10 Essential (primary) hypertension: Secondary | ICD-10-CM | POA: Diagnosis not present

## 2019-10-11 DIAGNOSIS — N1832 Chronic kidney disease, stage 3b: Secondary | ICD-10-CM | POA: Diagnosis not present

## 2019-10-11 DIAGNOSIS — R5381 Other malaise: Secondary | ICD-10-CM

## 2019-10-11 DIAGNOSIS — R600 Localized edema: Secondary | ICD-10-CM

## 2019-10-11 DIAGNOSIS — I5032 Chronic diastolic (congestive) heart failure: Secondary | ICD-10-CM | POA: Diagnosis not present

## 2019-10-11 DIAGNOSIS — E782 Mixed hyperlipidemia: Secondary | ICD-10-CM

## 2019-10-11 NOTE — Progress Notes (Signed)
Careteam: Patient Care Team: [name], DO as PCP - General (Geriatric Medicine) [name], MD as PCP - Cardiology (Cardiology) [name], MD as Consulting Physician (Cardiology) Normajean Glasgow, MD as Attending Physician (Physical Medicine and Rehabilitation) Phylliss Bob, MD as Consulting Physician (Orthopedic Surgery) Wilhelmina Mcardle, MD as Consulting Physician (Pulmonary Disease)  Advanced Directive information Does Patient Have a Medical Advance Directive?: Yes, Type of Advance Directive: Living will, Does patient want to make changes to medical advance directive?: No - Patient declined  Allergies  Allergen Reactions  . Morphine And Related Shortness Of Breath  . Percocet [Oxycodone-Acetaminophen] Shortness Of Breath  . Valium Shortness Of Breath    Chief Complaint  Patient presents with  . Medical Management of Chronic Issues    32-monthfollowup  . Immunizations    Flu vaccine given in the office today     HPI: Patient is a 85y.o. male seen in the office today for routine follow up. 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   CHF- on lasix and potassium per cardiology. Has some chronic shortness of breath but this is baseline. Walking well without significant breathing issues.   Elevated liver enzymes- statin was reduced. Fatty liver noted on [place] was unable to keep up with "low fat" diet. Eats what is given to him with meals on wheels.   COPD with chronic respiratory failure- continues on breo, albuterol and tessalon prn, has chronic shortness of breath due to CHF and COPD. Using o2 at night.   A fib- on amiodarone and metoprolol, continues on eliquis for anticoagulation.   CKD- ongoing. Does not drink enough per daughter  GERD- continues on protonix.   Daughter is staying with him now  Had a good therapist, feels like he could benefit from more therapy.   Review of Systems:  Review  of Systems  Constitutional: Negative for chills, fever and weight loss.  HENT: Negative for hearing loss and tinnitus.   Respiratory: Positive for shortness of breath (baseline). Negative for cough and sputum production.   Cardiovascular: Positive for leg swelling (minimal). Negative for chest pain and palpitations.  Gastrointestinal: Negative for abdominal pain, constipation, diarrhea and heartburn.  Genitourinary: Negative for dysuria, frequency and urgency.  Musculoskeletal: Positive for neck pain. Negative for back pain, falls, joint pain and myalgias.  Skin: Negative.   Neurological: Positive for tingling. Negative for dizziness and headaches.  Psychiatric/Behavioral: Negative for depression and memory loss. The patient is not nervous/anxious and does not have insomnia.     Past Medical History:  Diagnosis Date  . Anxiety   . Arthritis   . Atypical chest pain    a. 05/2017 MV: no ischemia, EF 79%.  . Chest pain 10/20/2017  . Chronic diastolic CHF (congestive heart failure) ([facility]    a. 01/2011 Echo: EF 50-55%, gr1 DD, mild AI, nl RV fxn, mild TR/PR; b. 10/2017 Echo: EF 60-65%, mild LVH, gr2 DD.  .Marland KitchenDDD (degenerative disc disease), cervical   . Depressive disorder, not elsewhere classified   . Difficult intubation   . Dysphagia, oral phase   . Dyspnea   . Edema   . Gallstones    a. Symptomatic - s/p lap chole 05/2018.  .Marland KitchenHistory of DVT (deep vein thrombosis)   . History of kidney stones   . Hypertension   . Hypoxemia   . Impacted cerumen   . Ischemic colitis ([facility]  a. 02/2018 GIB - colonoscopy w/ isch colitis. Anticoagulation resumed.  . Long term current use of anticoagulant 03/02/2011  . LOW BACK PAIN SYNDROME 03/17/2009   Qualifier: Diagnosis of  By: Council Mechanic MD, Hilaria Ota   . Mixed hyperlipidemia   . 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 Other myelopathy   . Pain in limb   . Palpitations   . Paroxysmal Atrial Fibrillation ([place])    a. a. 01/2011  in setting of post-op complications including aspiration pna;  b. CHA2DS2VASc = 4--> Amio/Eliquis.  . Pneumonia 03/06/2003  . Spinal stenosis, unspecified region other than cervical   . 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 Syncope    a. 10/2017-->Event monitor: RSR, rare PACs/PVCs.  . Thoracic or lumbosacral neuritis or radiculitis, unspecified    Past Surgical History:  Procedure Laterality Date  . CARDIOVERSION N/A 06/20/2017   Procedure: CARDIOVERSION;  Surgeon: [name], MD;  Location: [facility] ORS;  Service: Cardiovascular;  Laterality: N/A;  . CATARACT EXTRACTION W/ INTRAOCULAR LENS  IMPLANT, BILATERAL    . CERVICAL FUSION  02/10/2011  . CHOLECYSTECTOMY  05/24/2018  . CHOLECYSTECTOMY N/A 05/24/2018   Procedure: LAPAROSCOPIC CHOLECYSTECTOMY;  Surgeon: Coralie Keens, MD;  Location: [place];  Service: General;  Laterality: N/A;  . COLONOSCOPY WITH PROPOFOL N/A 02/20/2018   Procedure: COLONOSCOPY WITH PROPOFOL;  Surgeon: Lucilla Lame, MD;  Location: [facility] END[facility];  Service: Endoscopy;  Laterality: N/A;  . history of abd ultrasound  11/01   fatty liver  . MULTIPLE TOOTH EXTRACTIONS    . ORIF FIBULA FRACTURE Left 01/06/2017   Procedure: OPEN REDUCTION INTERNAL FIXATION (ORIF) FIBULA FRACTURE DISTAL FIBULA;  Surgeon: Melrose Nakayama, MD;  Location: [place] [facility];  Service: Orthopedics;  Laterality: Left;  Patient states has problems if he will have a tube in throat for Genera; Anesthesia   Social History:   reports that he has quit smoking. He quit after 1.00 year of use. He has never used smokeless tobacco. He reports that he does not drink alcohol or use drugs.  Family History  Problem Relation Age of Onset  . Stroke Father   . Atrial fibrillation Brother        on coumadin  . Heart disease Brother        AFib- coumadin  . Stroke Paternal Grandfather   . Other Mother        hemorrhage  . Cancer Other        colon cancer at early age   . Prostate cancer Neg Hx   . Kidney cancer Neg Hx   . Bladder Cancer Neg Hx     Medications: Patient's Medications  New Prescriptions   No medications on file  Previous Medications   ALBUTEROL (PROVENTIL) (2.5 MG/3ML) 0.083% NEBULIZER SOLUTION    Take 3 mLs (2.5 mg total) by nebulization 2 (two) times daily as needed for up to 30 doses for wheezing or shortness of breath.   AMIODARONE (PACERONE) 200 MG TABLET    TAKE 1 TABLET BY MOUTH EVERY DAY   APIXABAN (ELIQUIS) 5 MG TABS TABLET    Take 1 tablet (5 mg total) by mouth 2 (two) times daily.   CETIRIZINE (ZYRTEC) 10 MG TABLET    Take 1 tablet (10 mg total) by mouth daily.   CHOLECALCIFEROL (VITAMIN D) 1000 UNITS CAPSULE    Take one tablet once daily   FLUTICASONE (FLONASE) 50  MCG/ACT NASAL SPRAY    SHAKE LIQUID AND USE 2 SPRAYS IN EACH NOSTRIL DAILY   FLUTICASONE FUROATE-VILANTEROL (BREO ELLIPTA) 100-25 MCG/INH AEPB    Inhale 1 puff into the lungs daily.   FUROSEMIDE (LASIX) 40 MG TABLET    Take 1 tablet (40 mg total) by mouth daily.   GABAPENTIN (NEURONTIN) 300 MG CAPSULE    Take 1 capsule (300 mg total) by mouth 2 (two) times daily.   METOPROLOL TARTRATE (LOPRESSOR) 25 MG TABLET    Take 1 tablet (25 mg total) by mouth 2 (two) times daily.   OXYGEN    Inhale 2 L into the lungs at bedtime.   PANTOPRAZOLE (PROTONIX) 40 MG TABLET    TAKE 1 TABLET(40 MG) BY MOUTH AT BEDTIME   POTASSIUM CHLORIDE SA (K-DUR,KLOR-CON) 10 MEQ TABLET    Take 1 tablet (10 mEq total) by mouth daily.   PRAVASTATIN (PRAVACHOL) 40 MG TABLET    Take 1 tablet (40 mg total) by mouth daily.  Modified Medications   No medications on file  Discontinued Medications   BENZONATATE (TESSALON) 200 MG CAPSULE    Take 1 capsule (200 mg total) by mouth at bedtime as needed for cough.   TRAMADOL (ULTRAM) 50 MG TABLET    Take 1 tablet (50 mg total) by mouth every 6 (six) hours as needed.    Physical Exam:  Vitals:   10/11/19 1134  BP: 126/72  Pulse: 61  Temp: 98 F (36.7 C)   TempSrc: Oral  SpO2: 97%  Weight: 195 lb 12.8 oz (88.8 kg)  Height: 5' 6"  (1.676 m)   Body mass index is 31.6 kg/m. Wt Readings from Last 3 Encounters:  10/11/19 195 lb 12.8 oz (88.8 kg)  06/11/19 190 lb 6.4 oz (86.4 kg)  05/27/19 191 lb (86.6 kg)    Physical Exam Constitutional:      General: He is not in acute distress.    Appearance: He is well-developed. He is not diaphoretic.  HENT:     Head: Normocephalic and atraumatic.     Mouth/Throat:     Pharynx: No oropharyngeal exudate.  Eyes:     Conjunctiva/sclera: Conjunctivae normal.     Pupils: Pupils are equal, round, and reactive to light.  Neck:     Musculoskeletal: Normal range of motion and neck supple.  Cardiovascular:     Rate and Rhythm: Normal rate and regular rhythm.     Heart sounds: Normal heart sounds.  Pulmonary:     Effort: Pulmonary effort is normal.     Breath sounds: Normal breath sounds.  Abdominal:     General: Bowel sounds are normal.     Palpations: Abdomen is soft.  Musculoskeletal:        General: No tenderness.  Skin:    General: Skin is warm and dry.  Neurological:     Mental Status: He is alert and oriented to person, place, and time.     Labs reviewed: Basic Metabolic Panel: Recent Labs    03/02/19 0222 05/01/19 1011 05/27/19 1031  NA 139 141 141  K 4.3 4.2 4.6  CL 104 103 103  CO2 26 31 27   GLUCOSE 117* 88 91  BUN 25* 20 22  CREATININE 1.31* 1.13* 1.45*  CALCIUM 8.9 9.2 9.6   Liver Function Tests: Recent Labs    02/27/19 0145 03/01/19 0241 05/01/19 1011 05/27/19 1031  AST 87* 86* 69* 63*  ALT 61* 67* 55* 51*  ALKPHOS 58 42  --   --  BILITOT 1.2 0.8 0.9 0.8  PROT 7.4 6.5 7.0 7.3  ALBUMIN 4.3 3.5  --   --    No results for input(s): LIPASE, AMYLASE in the last 8760 hours. No results for input(s): AMMONIA in the last 8760 hours. CBC: Recent Labs    02/27/19 0145  03/01/19 0241 03/02/19 0222 05/01/19 1011  WBC 13.0*   < > 8.3 8.9 5.2  NEUTROABS 7.0  --    --   --  2,777  HGB 15.2   < > 12.8* 12.0* 14.3  HCT 47.4   < > 39.7 37.7* 43.0  MCV 100.0   < > 100.5* 98.7 95.8  PLT 204   < > 193 191 220   < > = values in this interval not displayed.   Lipid Panel: Recent Labs    05/27/19 1031  CHOL 139  HDL 35*  LDLCALC 72  TRIG 228*  CHOLHDL 4.0   TSH: No results for input(s): TSH in the last 8760 hours. A1C: Lab Results  Component Value Date   HGBA1C 5.7 (H) 05/16/2016     Assessment/Plan 1. Need for influenza vaccination - Flu Vaccine QUAD High Dose(Fluad)  2. Chronic diastolic CHF (congestive heart failure) (HCC) Stable at this time, no increase in edema or shortness of breath. Continue medication regimen. Recommended to follow up with cardiologist for 6 month follow up.  - Ambulatory referral to Home Health  3. Essential hypertension Controlled on current regimen. Will continue current medication with dietary modifications.  - Ambulatory referral to [facility]  4. PAF (paroxysmal atrial fibrillation) (HCC) -stable, in SR at this time. Continues on metoprolol and amiodarone for rate and eliquis for anticoagulation.  - CBC with Differential/Platelet - TSH - Ambulatory referral to [facility]  5. Elevated LFTs -will follow up liver enzymes.   6. Stage 3b chronic kidney disease Does not drink enough water per daughter who tries to encourage him. Will follow up lab.  - CMP with eGFR([facility])  7. Mixed hyperlipidemia Continues on 40 mg of pravastatin.  - CMP with eGFR([facility]) - Lipid Panel  8. Lower extremity edema -stable, continues on lasix.   70. Debility -daughter feels like he needs more therapy to remind him how to properly do exercises as he is not doing correctly and at risk for worsening weakness.  - Ambulatory referral to [facility]  Next appt: 5 months Orlandria Kissner K. Thompson, [facility] [phone]

## 2019-10-11 NOTE — Patient Instructions (Addendum)
Continue to work on dietary modifications, low fat diet.   Make follow up for cardiologist

## 2019-10-14 ENCOUNTER — Telehealth: Payer: Self-pay

## 2019-10-14 ENCOUNTER — Other Ambulatory Visit: Payer: Self-pay | Admitting: Nurse Practitioner

## 2019-10-14 DIAGNOSIS — E039 Hypothyroidism, unspecified: Secondary | ICD-10-CM

## 2019-10-14 MED ORDER — LEVOTHYROXINE SODIUM 25 MCG PO TABS: 25.0000 ug | ORAL_TABLET | Freq: Every day | ORAL | 1 refills | Status: DC

## 2019-10-14 NOTE — Telephone Encounter (Signed)
-----   Message from Lauree Chandler, NP sent at 10/14/2019  9:03 AM EST ----- Thyroid is not functioning as it should, can we add on a free t4 and have him follow up TSH in 6-8 weeks, also to start him on a low dose synthroid 25 mcg daily, 45 mins before all other medication in the morning with water. Liver enzymes and cholesterol unchanged- continue to work on low fat diet.  Kidney function, blood count and electrolytes unchanged.

## 2019-10-14 NOTE — Telephone Encounter (Signed)
Called and spoke to [place]. Relayed provider's response. She had no further questions.

## 2019-10-14 NOTE — Telephone Encounter (Signed)
Eric [name] from [facility] called and had a question about referral placed for Physical Therapy. She states she needs clarification on physical therapy. She states she needs to know what the need for physical therapy is. Routing to patient's provider for further information.

## 2019-10-14 NOTE — Telephone Encounter (Signed)
Overall weakness, debility, pt with hx CHF and COPD

## 2019-10-14 NOTE — Telephone Encounter (Signed)
Discussed results with patient, patient verbalized understanding of results  Scheduled appointment to recheck TSH 11/27/2019, future order placed   RX already sent to pharmacy. Janett Billow originally sent for #30/1 refill and the pharmacy sent a message stating patient requested 90 day supply. RX sent for #90 with no refills.  Patient aware he can view labs on mychart

## 2019-10-16 ENCOUNTER — Telehealth: Payer: Self-pay

## 2019-10-16 DIAGNOSIS — N1832 Chronic kidney disease, stage 3b: Secondary | ICD-10-CM | POA: Diagnosis not present

## 2019-10-16 DIAGNOSIS — M1990 Unspecified osteoarthritis, unspecified site: Secondary | ICD-10-CM | POA: Diagnosis not present

## 2019-10-16 DIAGNOSIS — D631 Anemia in chronic kidney disease: Secondary | ICD-10-CM | POA: Diagnosis not present

## 2019-10-16 DIAGNOSIS — I088 Other rheumatic multiple valve diseases: Secondary | ICD-10-CM

## 2019-10-16 DIAGNOSIS — F329 Major depressive disorder, single episode, unspecified: Secondary | ICD-10-CM | POA: Diagnosis not present

## 2019-10-16 DIAGNOSIS — M5000 Cervical disc disorder with myelopathy, unspecified cervical region: Secondary | ICD-10-CM | POA: Diagnosis not present

## 2019-10-16 DIAGNOSIS — M4722 Other spondylosis with radiculopathy, cervical region: Secondary | ICD-10-CM | POA: Diagnosis not present

## 2019-10-16 DIAGNOSIS — I130 Hypertensive heart and chronic kidney disease with heart failure and stage 1 through stage 4 chronic kidney disease, or unspecified chronic kidney disease: Secondary | ICD-10-CM | POA: Diagnosis not present

## 2019-10-16 DIAGNOSIS — M4712 Other spondylosis with myelopathy, cervical region: Secondary | ICD-10-CM | POA: Diagnosis not present

## 2019-10-16 DIAGNOSIS — N529 Male erectile dysfunction, unspecified: Secondary | ICD-10-CM | POA: Diagnosis not present

## 2019-10-16 DIAGNOSIS — M4802 Spinal stenosis, cervical region: Secondary | ICD-10-CM | POA: Diagnosis not present

## 2019-10-16 DIAGNOSIS — H6120 Impacted cerumen, unspecified ear: Secondary | ICD-10-CM | POA: Diagnosis not present

## 2019-10-16 DIAGNOSIS — R1311 Dysphagia, oral phase: Secondary | ICD-10-CM | POA: Diagnosis not present

## 2019-10-16 DIAGNOSIS — F419 Anxiety disorder, unspecified: Secondary | ICD-10-CM | POA: Diagnosis not present

## 2019-10-16 DIAGNOSIS — J449 Chronic obstructive pulmonary disease, unspecified: Secondary | ICD-10-CM

## 2019-10-16 DIAGNOSIS — K219 Gastro-esophageal reflux disease without esophagitis: Secondary | ICD-10-CM | POA: Diagnosis not present

## 2019-10-16 DIAGNOSIS — G629 Polyneuropathy, unspecified: Secondary | ICD-10-CM | POA: Diagnosis not present

## 2019-10-16 DIAGNOSIS — E559 Vitamin D deficiency, unspecified: Secondary | ICD-10-CM | POA: Diagnosis not present

## 2019-10-16 DIAGNOSIS — M48062 Spinal stenosis, lumbar region with neurogenic claudication: Secondary | ICD-10-CM | POA: Diagnosis not present

## 2019-10-16 DIAGNOSIS — M5010 Cervical disc disorder with radiculopathy, unspecified cervical region: Secondary | ICD-10-CM | POA: Diagnosis not present

## 2019-10-16 DIAGNOSIS — I5032 Chronic diastolic (congestive) heart failure: Secondary | ICD-10-CM | POA: Diagnosis not present

## 2019-10-16 DIAGNOSIS — K760 Fatty (change of) liver, not elsewhere classified: Secondary | ICD-10-CM | POA: Diagnosis not present

## 2019-10-16 DIAGNOSIS — J9611 Chronic respiratory failure with hypoxia: Secondary | ICD-10-CM

## 2019-10-16 DIAGNOSIS — I480 Paroxysmal atrial fibrillation: Secondary | ICD-10-CM

## 2019-10-16 LAB — COMPLETE METABOLIC PANEL WITH GFR
AG Ratio: 1.7 (calc) (ref 1.0–2.5)
ALT: 49 U/L — ABNORMAL HIGH (ref 9–46)
AST: 59 U/L — ABNORMAL HIGH (ref 10–35)
Albumin: 4.5 g/dL (ref 3.6–5.1)
Alkaline phosphatase (APISO): 53 U/L (ref 35–144)
BUN/Creatinine Ratio: 21 (calc) (ref 6–22)
BUN: 29 mg/dL — ABNORMAL HIGH (ref 7–25)
CO2: 32 mmol/L (ref 20–32)
Calcium: 9.6 mg/dL (ref 8.6–10.3)
Chloride: 105 mmol/L (ref 98–110)
Creat: 1.39 mg/dL — ABNORMAL HIGH (ref 0.70–1.11)
GFR, Est African American: 53 mL/min/{1.73_m2} — ABNORMAL LOW (ref >=60)
GFR, Est Non African American: 46 mL/min/{1.73_m2} — ABNORMAL LOW (ref >=60)
Globulin: 2.6 g/dL (calc) (ref 1.9–3.7)
Glucose, Bld: 84 mg/dL (ref 65–99)
Potassium: 4.6 mmol/L (ref 3.5–5.3)
Sodium: 144 mmol/L (ref 135–146)
Total Bilirubin: 1 mg/dL (ref 0.2–1.2)
Total Protein: 7.1 g/dL (ref 6.1–8.1)

## 2019-10-16 LAB — CBC WITH DIFFERENTIAL/PLATELET
Absolute Monocytes: 508 cells/uL (ref 200–950)
Basophils Absolute: 40 cells/uL (ref 0–200)
Basophils Relative: 0.6 %
Eosinophils Absolute: 112 cells/uL (ref 15–500)
Eosinophils Relative: 1.7 %
HCT: 42.4 % (ref 38.5–50.0)
Hemoglobin: 14.2 g/dL (ref 13.2–17.1)
Lymphs Abs: 2950 cells/uL (ref 850–3900)
MCH: 32.1 pg (ref 27.0–33.0)
MCHC: 33.5 g/dL (ref 32.0–36.0)
MCV: 95.9 fL (ref 80.0–100.0)
MPV: 10.4 fL (ref 7.5–12.5)
Monocytes Relative: 7.7 %
Neutro Abs: 2990 cells/uL (ref 1500–7800)
Neutrophils Relative %: 45.3 %
Platelets: 192 10*3/uL (ref 140–400)
RBC: 4.42 10*6/uL (ref 4.20–5.80)
RDW: 12.6 % (ref 11.0–15.0)
Total Lymphocyte: 44.7 %
WBC: 6.6 10*3/uL (ref 3.8–10.8)

## 2019-10-16 LAB — LIPID PANEL
Cholesterol: 136 mg/dL (ref <200)
HDL: 34 mg/dL — ABNORMAL LOW (ref >=40)
LDL Cholesterol (Calc): 69 mg/dL (calc)
Non-HDL Cholesterol (Calc): 102 mg/dL (calc) (ref <130)
Total CHOL/HDL Ratio: 4 (calc) (ref <5.0)
Triglycerides: 237 mg/dL — ABNORMAL HIGH (ref <150)

## 2019-10-16 LAB — TEST AUTHORIZATION

## 2019-10-16 LAB — TSH: TSH: 21.51 mIU/L — ABNORMAL HIGH (ref 0.40–4.50)

## 2019-10-16 LAB — T4, FREE: Free T4: 0.6 ng/dL — ABNORMAL LOW (ref 0.8–1.8)

## 2019-10-16 NOTE — Telephone Encounter (Addendum)
[name] physical therapist with [facility] called today. States she needs verbal orders for physical therapy and frequency. She recommends patient have physical therapy for 2 times a week for 6 weeks then 1 time a week for 2 weeks. Routing to provider.

## 2019-10-16 NOTE — Telephone Encounter (Signed)
Called [name] and relayed providers response. She verbalized understanding and denied further questions.

## 2019-10-16 NOTE — Telephone Encounter (Signed)
I agree with PT recommendations.

## 2019-10-21 ENCOUNTER — Other Ambulatory Visit: Payer: Self-pay | Admitting: Nurse Practitioner

## 2019-10-22 DIAGNOSIS — J189 Pneumonia, unspecified organism: Secondary | ICD-10-CM | POA: Diagnosis not present

## 2019-10-22 DIAGNOSIS — J9601 Acute respiratory failure with hypoxia: Secondary | ICD-10-CM | POA: Diagnosis not present

## 2019-10-24 DIAGNOSIS — I5032 Chronic diastolic (congestive) heart failure: Secondary | ICD-10-CM | POA: Diagnosis not present

## 2019-10-24 DIAGNOSIS — R062 Wheezing: Secondary | ICD-10-CM | POA: Diagnosis not present

## 2019-10-29 DIAGNOSIS — N1832 Chronic kidney disease, stage 3b: Secondary | ICD-10-CM | POA: Diagnosis not present

## 2019-10-29 DIAGNOSIS — M4712 Other spondylosis with myelopathy, cervical region: Secondary | ICD-10-CM | POA: Diagnosis not present

## 2019-10-29 DIAGNOSIS — K760 Fatty (change of) liver, not elsewhere classified: Secondary | ICD-10-CM | POA: Diagnosis not present

## 2019-10-29 DIAGNOSIS — M5010 Cervical disc disorder with radiculopathy, unspecified cervical region: Secondary | ICD-10-CM | POA: Diagnosis not present

## 2019-10-29 DIAGNOSIS — I088 Other rheumatic multiple valve diseases: Secondary | ICD-10-CM | POA: Diagnosis not present

## 2019-10-29 DIAGNOSIS — M1990 Unspecified osteoarthritis, unspecified site: Secondary | ICD-10-CM | POA: Diagnosis not present

## 2019-10-29 DIAGNOSIS — H6120 Impacted cerumen, unspecified ear: Secondary | ICD-10-CM | POA: Diagnosis not present

## 2019-10-29 DIAGNOSIS — K219 Gastro-esophageal reflux disease without esophagitis: Secondary | ICD-10-CM | POA: Diagnosis not present

## 2019-10-29 DIAGNOSIS — I130 Hypertensive heart and chronic kidney disease with heart failure and stage 1 through stage 4 chronic kidney disease, or unspecified chronic kidney disease: Secondary | ICD-10-CM | POA: Diagnosis not present

## 2019-10-29 DIAGNOSIS — R1311 Dysphagia, oral phase: Secondary | ICD-10-CM | POA: Diagnosis not present

## 2019-10-29 DIAGNOSIS — I5032 Chronic diastolic (congestive) heart failure: Secondary | ICD-10-CM | POA: Diagnosis not present

## 2019-10-29 DIAGNOSIS — F419 Anxiety disorder, unspecified: Secondary | ICD-10-CM | POA: Diagnosis not present

## 2019-10-29 DIAGNOSIS — J9611 Chronic respiratory failure with hypoxia: Secondary | ICD-10-CM | POA: Diagnosis not present

## 2019-10-29 DIAGNOSIS — N529 Male erectile dysfunction, unspecified: Secondary | ICD-10-CM | POA: Diagnosis not present

## 2019-10-29 DIAGNOSIS — G629 Polyneuropathy, unspecified: Secondary | ICD-10-CM | POA: Diagnosis not present

## 2019-10-29 DIAGNOSIS — E559 Vitamin D deficiency, unspecified: Secondary | ICD-10-CM | POA: Diagnosis not present

## 2019-10-29 DIAGNOSIS — I480 Paroxysmal atrial fibrillation: Secondary | ICD-10-CM | POA: Diagnosis not present

## 2019-10-29 DIAGNOSIS — M5000 Cervical disc disorder with myelopathy, unspecified cervical region: Secondary | ICD-10-CM | POA: Diagnosis not present

## 2019-10-29 DIAGNOSIS — M4722 Other spondylosis with radiculopathy, cervical region: Secondary | ICD-10-CM | POA: Diagnosis not present

## 2019-10-29 DIAGNOSIS — D631 Anemia in chronic kidney disease: Secondary | ICD-10-CM | POA: Diagnosis not present

## 2019-10-29 DIAGNOSIS — M48062 Spinal stenosis, lumbar region with neurogenic claudication: Secondary | ICD-10-CM | POA: Diagnosis not present

## 2019-10-29 DIAGNOSIS — M4802 Spinal stenosis, cervical region: Secondary | ICD-10-CM | POA: Diagnosis not present

## 2019-10-29 DIAGNOSIS — F329 Major depressive disorder, single episode, unspecified: Secondary | ICD-10-CM | POA: Diagnosis not present

## 2019-10-29 DIAGNOSIS — J449 Chronic obstructive pulmonary disease, unspecified: Secondary | ICD-10-CM | POA: Diagnosis not present

## 2019-11-12 DIAGNOSIS — M1990 Unspecified osteoarthritis, unspecified site: Secondary | ICD-10-CM | POA: Diagnosis not present

## 2019-11-12 DIAGNOSIS — I480 Paroxysmal atrial fibrillation: Secondary | ICD-10-CM | POA: Diagnosis not present

## 2019-11-12 DIAGNOSIS — M5000 Cervical disc disorder with myelopathy, unspecified cervical region: Secondary | ICD-10-CM | POA: Diagnosis not present

## 2019-11-12 DIAGNOSIS — K760 Fatty (change of) liver, not elsewhere classified: Secondary | ICD-10-CM | POA: Diagnosis not present

## 2019-11-12 DIAGNOSIS — R1311 Dysphagia, oral phase: Secondary | ICD-10-CM | POA: Diagnosis not present

## 2019-11-12 DIAGNOSIS — M4712 Other spondylosis with myelopathy, cervical region: Secondary | ICD-10-CM | POA: Diagnosis not present

## 2019-11-12 DIAGNOSIS — M5010 Cervical disc disorder with radiculopathy, unspecified cervical region: Secondary | ICD-10-CM | POA: Diagnosis not present

## 2019-11-12 DIAGNOSIS — G629 Polyneuropathy, unspecified: Secondary | ICD-10-CM | POA: Diagnosis not present

## 2019-11-12 DIAGNOSIS — I5032 Chronic diastolic (congestive) heart failure: Secondary | ICD-10-CM | POA: Diagnosis not present

## 2019-11-12 DIAGNOSIS — H6120 Impacted cerumen, unspecified ear: Secondary | ICD-10-CM | POA: Diagnosis not present

## 2019-11-12 DIAGNOSIS — F329 Major depressive disorder, single episode, unspecified: Secondary | ICD-10-CM | POA: Diagnosis not present

## 2019-11-12 DIAGNOSIS — I088 Other rheumatic multiple valve diseases: Secondary | ICD-10-CM | POA: Diagnosis not present

## 2019-11-12 DIAGNOSIS — I130 Hypertensive heart and chronic kidney disease with heart failure and stage 1 through stage 4 chronic kidney disease, or unspecified chronic kidney disease: Secondary | ICD-10-CM | POA: Diagnosis not present

## 2019-11-12 DIAGNOSIS — N1832 Chronic kidney disease, stage 3b: Secondary | ICD-10-CM | POA: Diagnosis not present

## 2019-11-12 DIAGNOSIS — K219 Gastro-esophageal reflux disease without esophagitis: Secondary | ICD-10-CM | POA: Diagnosis not present

## 2019-11-12 DIAGNOSIS — M4722 Other spondylosis with radiculopathy, cervical region: Secondary | ICD-10-CM | POA: Diagnosis not present

## 2019-11-12 DIAGNOSIS — E559 Vitamin D deficiency, unspecified: Secondary | ICD-10-CM | POA: Diagnosis not present

## 2019-11-12 DIAGNOSIS — J449 Chronic obstructive pulmonary disease, unspecified: Secondary | ICD-10-CM | POA: Diagnosis not present

## 2019-11-12 DIAGNOSIS — M48062 Spinal stenosis, lumbar region with neurogenic claudication: Secondary | ICD-10-CM | POA: Diagnosis not present

## 2019-11-12 DIAGNOSIS — D631 Anemia in chronic kidney disease: Secondary | ICD-10-CM | POA: Diagnosis not present

## 2019-11-12 DIAGNOSIS — M4802 Spinal stenosis, cervical region: Secondary | ICD-10-CM | POA: Diagnosis not present

## 2019-11-12 DIAGNOSIS — F419 Anxiety disorder, unspecified: Secondary | ICD-10-CM | POA: Diagnosis not present

## 2019-11-12 DIAGNOSIS — J9611 Chronic respiratory failure with hypoxia: Secondary | ICD-10-CM | POA: Diagnosis not present

## 2019-11-12 DIAGNOSIS — N529 Male erectile dysfunction, unspecified: Secondary | ICD-10-CM | POA: Diagnosis not present

## 2019-11-18 ENCOUNTER — Other Ambulatory Visit: Payer: Self-pay | Admitting: Cardiovascular Disease

## 2019-11-18 NOTE — Telephone Encounter (Signed)
Please schedule patient for F/U appointment with Dr. Rockey Situ. Thank you!

## 2019-11-21 DIAGNOSIS — J189 Pneumonia, unspecified organism: Secondary | ICD-10-CM | POA: Diagnosis not present

## 2019-11-21 DIAGNOSIS — J9601 Acute respiratory failure with hypoxia: Secondary | ICD-10-CM | POA: Diagnosis not present

## 2019-11-27 ENCOUNTER — Other Ambulatory Visit: Payer: Self-pay

## 2019-11-27 DIAGNOSIS — I088 Other rheumatic multiple valve diseases: Secondary | ICD-10-CM | POA: Diagnosis not present

## 2019-11-27 DIAGNOSIS — J9611 Chronic respiratory failure with hypoxia: Secondary | ICD-10-CM | POA: Diagnosis not present

## 2019-11-27 DIAGNOSIS — M4722 Other spondylosis with radiculopathy, cervical region: Secondary | ICD-10-CM | POA: Diagnosis not present

## 2019-11-27 DIAGNOSIS — R1311 Dysphagia, oral phase: Secondary | ICD-10-CM | POA: Diagnosis not present

## 2019-11-27 DIAGNOSIS — N529 Male erectile dysfunction, unspecified: Secondary | ICD-10-CM | POA: Diagnosis not present

## 2019-11-27 DIAGNOSIS — F419 Anxiety disorder, unspecified: Secondary | ICD-10-CM | POA: Diagnosis not present

## 2019-11-27 DIAGNOSIS — M5000 Cervical disc disorder with myelopathy, unspecified cervical region: Secondary | ICD-10-CM | POA: Diagnosis not present

## 2019-11-27 DIAGNOSIS — I5032 Chronic diastolic (congestive) heart failure: Secondary | ICD-10-CM | POA: Diagnosis not present

## 2019-11-27 DIAGNOSIS — G629 Polyneuropathy, unspecified: Secondary | ICD-10-CM | POA: Diagnosis not present

## 2019-11-27 DIAGNOSIS — H6120 Impacted cerumen, unspecified ear: Secondary | ICD-10-CM | POA: Diagnosis not present

## 2019-11-27 DIAGNOSIS — M5010 Cervical disc disorder with radiculopathy, unspecified cervical region: Secondary | ICD-10-CM | POA: Diagnosis not present

## 2019-11-27 DIAGNOSIS — J449 Chronic obstructive pulmonary disease, unspecified: Secondary | ICD-10-CM | POA: Diagnosis not present

## 2019-11-27 DIAGNOSIS — M4712 Other spondylosis with myelopathy, cervical region: Secondary | ICD-10-CM | POA: Diagnosis not present

## 2019-11-27 DIAGNOSIS — K219 Gastro-esophageal reflux disease without esophagitis: Secondary | ICD-10-CM | POA: Diagnosis not present

## 2019-11-27 DIAGNOSIS — I130 Hypertensive heart and chronic kidney disease with heart failure and stage 1 through stage 4 chronic kidney disease, or unspecified chronic kidney disease: Secondary | ICD-10-CM | POA: Diagnosis not present

## 2019-11-27 DIAGNOSIS — M4802 Spinal stenosis, cervical region: Secondary | ICD-10-CM | POA: Diagnosis not present

## 2019-11-27 DIAGNOSIS — M48062 Spinal stenosis, lumbar region with neurogenic claudication: Secondary | ICD-10-CM | POA: Diagnosis not present

## 2019-11-27 DIAGNOSIS — F329 Major depressive disorder, single episode, unspecified: Secondary | ICD-10-CM | POA: Diagnosis not present

## 2019-11-27 DIAGNOSIS — N1832 Chronic kidney disease, stage 3b: Secondary | ICD-10-CM | POA: Diagnosis not present

## 2019-11-27 DIAGNOSIS — K760 Fatty (change of) liver, not elsewhere classified: Secondary | ICD-10-CM | POA: Diagnosis not present

## 2019-11-27 DIAGNOSIS — M1990 Unspecified osteoarthritis, unspecified site: Secondary | ICD-10-CM | POA: Diagnosis not present

## 2019-11-27 DIAGNOSIS — D631 Anemia in chronic kidney disease: Secondary | ICD-10-CM | POA: Diagnosis not present

## 2019-11-27 DIAGNOSIS — I480 Paroxysmal atrial fibrillation: Secondary | ICD-10-CM | POA: Diagnosis not present

## 2019-11-27 DIAGNOSIS — E559 Vitamin D deficiency, unspecified: Secondary | ICD-10-CM | POA: Diagnosis not present

## 2019-12-17 ENCOUNTER — Telehealth: Payer: Self-pay | Admitting: Cardiovascular Disease

## 2019-12-17 NOTE — Telephone Encounter (Signed)
Spoke with patients caregiver per release form and she was told his amiodarone might be causing his thyroid problems. He has been on that for some time and reviewed that it is to keep his heart in normal rhythm. Confirmed his upcoming appointment and advised that we can certainly review this information and concerns at that time with provider. She wanted to know if we could draw his labs and advised that would be up to the doctor. She verbalized understanding, confirmed appointment, and had no further questions at this time.

## 2019-12-17 NOTE — Telephone Encounter (Signed)
Pt c/o medication issue:  1. Name of Medication: amiodarone   2. How are you currently taking this medication (dosage and times per day)? 200 mg daily  3. Are you having a reaction (difficulty breathing--STAT)? n/a  4. What is your medication issue? Daughter believes this is messing with patients thyroid by making it hyperactive(states pcp gave them this info).  Patient was placed on a thyroid medication in November. Due to covid exposure at doctors office, patient has not has blood work drawn to see if medication is working. Patients daughter would like if patient could have blood work drawn when patient comes in office.

## 2019-12-18 ENCOUNTER — Other Ambulatory Visit: Payer: Self-pay | Admitting: Cardiovascular Disease

## 2019-12-18 MED ORDER — FUROSEMIDE 40 MG PO TABS: 40.0000 mg | ORAL_TABLET | Freq: Every day | ORAL | 0 refills | Status: DC

## 2019-12-18 MED ORDER — PANTOPRAZOLE SODIUM 40 MG PO TBEC: DELAYED_RELEASE_TABLET | ORAL | 0 refills | Status: DC

## 2019-12-20 NOTE — Progress Notes (Signed)
Date:  12/24/2019   ID:  Eric [name], DOB 07-19-1932, MRN [id number]  Patient Location:  [place] [address]   Provider location:   [facility], [place] office  PCP:  [name], DO  Cardiologist:  [facility]  Chief Complaint  Patient presents with  . office visit    12 mo F/U; Patient reports SOB and discoloration in feet; Meds verbally reviewed with patient.    History of Present Illness:    Eric [name] is a 86 y.o. male PMH of  atrial fibrillation, frequent PVCs,  maintaining sinus rhythm, severe neck disease and stenosis who underwent cervical and thoracic laminectomy and fusion with instrumentation at the beginning of March 2012 with postoperative complications including aspiration requiring a feeding tube  Remote smoking in the [employer] Chronic SOB, deconditioned Cardioversion 06/2017 who presents for routine followup of his atrial fibrillation,  paroxysmal/persistent  On amiodarone, Eliquis, metoprolol He reports having problem with his thyroid, recently started on Synthroid Scheduled to have recheck  Weight up 12 pounds Poor diet, no exercise Limited by weak legs and chronic shortness of breath  Chronic cough, in the [place], on oxygen at night Chronic shortness of breath on exertion at home SOB with bending over,  Walks with a walker He did complete physical therapy  EKG personally reviewed by myself on todays visit Shows sinus rhythm with first-degree AV block, unable to exclude Wenke block, rate 61 bpm  Other past medical history reviewed  hospital March 18-21 , 2020 Admitted for sepsis and acute on chronic hypoxic respiratory failure secondary to bronchitis and rhinovirus infection. Initially presented with fever, chills, cough and shortness of breath Sent home with prednisone  Event Monitor Avg HR of 58 bpm. Predominant underlying rhythm was Sinus Rhythm.  Second Degree AV Block-Mobitz I (Wenckebach) was present.    Isolated SVEs were rare (<1.0%), and no SVE Couplets or SVE Triplets were present. Isolated VEs were rare (<1.0%), and no VE Couplets or VE Triplets were present. Patient triggered events were not associated with significant arrhythmia.  Echocardiogram 04/04/2017 with ejection fraction 55-60%, mildly dilated left atrium    Past Medical History:  Diagnosis Date  . Anxiety   . Arthritis   . Atypical chest pain    a. 05/2017 MV: no ischemia, EF 79%.  . Chest pain 10/20/2017  . Chronic diastolic CHF (congestive heart failure) ([place])    a. 01/2011 Echo: EF 50-55%, gr1 DD, mild AI, nl RV fxn, mild TR/PR; b. 10/2017 Echo: EF 60-65%, mild LVH, gr2 DD.  Marland Kitchen DDD (degenerative disc disease), cervical   . Depressive disorder, not elsewhere classified   . Difficult intubation   . Dysphagia, oral phase   . Dyspnea   . Edema   . Gallstones    a. Symptomatic - s/p lap chole 05/2018.  Marland Kitchen History of DVT (deep vein thrombosis)   . History of kidney stones   . Hypertension   . Hypoxemia   . Impacted cerumen   . Ischemic colitis ([place])    a. 02/2018 GIB - colonoscopy w/ isch colitis. Anticoagulation resumed.  . Long term current use of anticoagulant 03/02/2011  . LOW BACK PAIN SYNDROME 03/17/2009   Qualifier: Diagnosis of  By: Council Mechanic MD, Hilaria Ota   . Mixed hyperlipidemia   . 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 Other myelopathy   . Pain in limb   . Palpitations   .  Paroxysmal Atrial Fibrillation ([place])    a. a. 01/2011 in setting of post-op complications including aspiration pna;  b. CHA2DS2VASc = 4--> Amio/Eliquis.  . Pneumonia 03/06/2003  . Spinal stenosis, unspecified region other than cervical   . 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 Syncope    a. 10/2017-->Event monitor: RSR, rare PACs/PVCs.  . Thoracic or lumbosacral neuritis or radiculitis, unspecified    Past Surgical History:  Procedure Laterality Date  .  CARDIOVERSION N/A 06/20/2017   Procedure: CARDIOVERSION;  Surgeon: [name], MD;  Location: [facility] ORS;  Service: Cardiovascular;  Laterality: N/A;  . CATARACT EXTRACTION W/ INTRAOCULAR LENS  IMPLANT, BILATERAL    . CERVICAL FUSION  02/10/2011  . CHOLECYSTECTOMY  05/24/2018  . CHOLECYSTECTOMY N/A 05/24/2018   Procedure: LAPAROSCOPIC CHOLECYSTECTOMY;  Surgeon: Coralie Keens, MD;  Location: [place];  Service: General;  Laterality: N/A;  . COLONOSCOPY WITH PROPOFOL N/A 02/20/2018   Procedure: COLONOSCOPY WITH PROPOFOL;  Surgeon: Lucilla Lame, MD;  Location: [facility] END[facility];  Service: Endoscopy;  Laterality: N/A;  . history of abd ultrasound  11/01   fatty liver  . MULTIPLE TOOTH EXTRACTIONS    . ORIF FIBULA FRACTURE Left 01/06/2017   Procedure: OPEN REDUCTION INTERNAL FIXATION (ORIF) FIBULA FRACTURE DISTAL FIBULA;  Surgeon: Melrose Nakayama, MD;  Location: [facility];  Service: Orthopedics;  Laterality: Left;  Patient states has problems if he will have a tube in throat for Genera; Anesthesia     Current Meds  Medication Sig  . albuterol (PROVENTIL) (2.5 MG/3ML) 0.083% nebulizer solution Take 3 mLs (2.5 mg total) by nebulization 2 (two) times daily as needed for up to 30 doses for wheezing or shortness of breath.  Marland Kitchen amiodarone (PACERONE) 200 MG tablet TAKE 1 TABLET(200 MG) BY MOUTH DAILY  . apixaban (ELIQUIS) 5 MG TABS tablet Take 1 tablet (5 mg total) by mouth 2 (two) times daily.  . Cholecalciferol (VITAMIN D) 1000 UNITS capsule Take one tablet once daily  . fluticasone (FLONASE) 50 MCG/ACT nasal spray SHAKE LIQUID AND USE 2 SPRAYS IN EACH NOSTRIL DAILY  . fluticasone furoate-vilanterol (BREO ELLIPTA) 100-25 MCG/INH AEPB Inhale 1 puff into the lungs daily.  . furosemide (LASIX) 40 MG tablet Take 1 tablet (40 mg total) by mouth daily.  Marland Kitchen gabapentin (NEURONTIN) 300 MG capsule Take 1 capsule (300 mg total) by mouth 2 (two) times daily.  Marland Kitchen levothyroxine (SYNTHROID) 25 MCG tablet TAKE 1 TABLET(25 MCG)  BY MOUTH DAILY BEFORE BREAKFAST  . metoprolol tartrate (LOPRESSOR) 25 MG tablet TAKE 1 TABLET(25 MG) BY MOUTH TWICE DAILY  . OXYGEN Inhale 2 L into the lungs at bedtime.  . pantoprazole (PROTONIX) 40 MG tablet TAKE 1 TABLET(40 MG) BY MOUTH AT BEDTIME  . potassium chloride SA (K-DUR,KLOR-CON) 10 MEQ tablet Take 1 tablet (10 mEq total) by mouth daily.  . pravastatin (PRAVACHOL) 40 MG tablet Take 1 tablet (40 mg total) by mouth daily.     Allergies:   Morphine and related, Percocet [oxycodone-acetaminophen], and Valium   Social History   Tobacco Use  . Smoking status: Former Smoker    Years: 1.00  . Smokeless tobacco: Never Used  . Tobacco comment: stopped in 20's  Substance Use Topics  . Alcohol use: No  . Drug use: No     Current Outpatient Medications on File Prior to Visit  Medication Sig Dispense Refill  . albuterol (PROVENTIL) (2.5 MG/3ML) 0.083% nebulizer solution Take 3 mLs (2.5  mg total) by nebulization 2 (two) times daily as needed for up to 30 doses for wheezing or shortness of breath. 75 mL 3  . amiodarone (PACERONE) 200 MG tablet TAKE 1 TABLET(200 MG) BY MOUTH DAILY 90 tablet 0  . apixaban (ELIQUIS) 5 MG TABS tablet Take 1 tablet (5 mg total) by mouth 2 (two) times daily. 60 tablet 6  . Cholecalciferol (VITAMIN D) 1000 UNITS capsule Take one tablet once daily 90 capsule 3  . fluticasone (FLONASE) 50 MCG/ACT nasal spray SHAKE LIQUID AND USE 2 SPRAYS IN EACH NOSTRIL DAILY 16 g 0  . fluticasone furoate-vilanterol (BREO ELLIPTA) 100-25 MCG/INH AEPB Inhale 1 puff into the lungs daily. 28 each 1  . furosemide (LASIX) 40 MG tablet Take 1 tablet (40 mg total) by mouth daily. 90 tablet 0  . gabapentin (NEURONTIN) 300 MG capsule Take 1 capsule (300 mg total) by mouth 2 (two) times daily. 180 capsule 1  . levothyroxine (SYNTHROID) 25 MCG tablet TAKE 1 TABLET(25 MCG) BY MOUTH DAILY BEFORE BREAKFAST 90 tablet 0  . metoprolol tartrate (LOPRESSOR) 25 MG tablet TAKE 1 TABLET(25 MG) BY  MOUTH TWICE DAILY 60 tablet 0  . OXYGEN Inhale 2 L into the lungs at bedtime.    . pantoprazole (PROTONIX) 40 MG tablet TAKE 1 TABLET(40 MG) BY MOUTH AT BEDTIME 90 tablet 0  . potassium chloride SA (K-DUR,KLOR-CON) 10 MEQ tablet Take 1 tablet (10 mEq total) by mouth daily. 90 tablet 3  . pravastatin (PRAVACHOL) 40 MG tablet Take 1 tablet (40 mg total) by mouth daily. 90 tablet 3  . cetirizine (ZYRTEC) 10 MG tablet TAKE 1 TABLET BY MOUTH EVERY DAY (Patient not taking: Reported on 12/24/2019) 90 tablet 0  . furosemide (LASIX) 40 MG tablet TAKE 1 TABLET(40 MG) BY MOUTH DAILY (Patient not taking: Reported on 12/24/2019) 90 tablet 3  . metoprolol tartrate (LOPRESSOR) 25 MG tablet TAKE 1 TABLET(25 MG) BY MOUTH TWICE DAILY (Patient not taking: Reported on 12/24/2019) 60 tablet 11  . pantoprazole (PROTONIX) 40 MG tablet TAKE 1 TABLET(40 MG) BY MOUTH AT BEDTIME (Patient not taking: Reported on 12/24/2019) 90 tablet 1   No current facility-administered medications on file prior to visit.     Family Hx: The patient's family history includes Atrial fibrillation in his brother; Cancer in an other family member; Heart disease in his brother; Other in his mother; Stroke in his father and paternal grandfather. There is no history of Prostate cancer, Kidney cancer, or Bladder Cancer.  ROS:   Please see the history of present illness.    Review of Systems  Constitutional: Negative.        Weight gain  Respiratory: Positive for cough and shortness of breath.   Cardiovascular: Negative.   Gastrointestinal: Negative.   Musculoskeletal: Negative.   Neurological: Negative.   Psychiatric/Behavioral: Negative.   All other systems reviewed and are negative.    Labs/Other Tests and Data Reviewed:    Recent Labs: 05/01/2019: Brain Natriuretic Peptide 53 10/11/2019: ALT 49; BUN 29; Creat 1.39; Hemoglobin 14.2; Platelets 192; Potassium 4.6; Sodium 144; TSH 21.51   Recent Lipid Panel Lab Results  Component Value  Date/Time   CHOL 136 10/11/2019 12:15 PM   CHOL 169 05/16/2016 10:35 AM   TRIG 237 (H) 10/11/2019 12:15 PM   HDL 34 (L) 10/11/2019 12:15 PM   HDL 37 (L) 05/16/2016 10:35 AM   CHOLHDL 4.0 10/11/2019 12:15 PM   LDLCALC 69 10/11/2019 12:15 PM   LDLDIRECT 97.4 11/29/2010 09:21 AM  Wt Readings from Last 3 Encounters:  12/24/19 199 lb 4 oz (90.4 kg)  10/11/19 195 lb 12.8 oz (88.8 kg)  06/11/19 190 lb 6.4 oz (86.4 kg)     Exam:    BP 134/76 (BP Location: Left Arm, Patient Position: Sitting, Cuff Size: Normal)   Pulse 61   Ht 5\' 6"  (1.676 m)   Wt 199 lb 4 oz (90.4 kg)   SpO2 95%   BMI 32.16 kg/m  Constitutional:  oriented to person, place, and time. No distress.  HENT:  Head: Grossly normal Eyes:  no discharge. No scleral icterus.  Neck: No JVD, no carotid bruits  Cardiovascular: Regular rate and rhythm, no murmurs appreciated Pulmonary/Chest: Clear to auscultation bilaterally, no wheezes or rails Abdominal: Soft.  no distension.  no tenderness.  Musculoskeletal: Normal range of motion Neurological:  normal muscle tone. Coordination normal. No atrophy Skin: Skin warm and dry Psychiatric: normal affect, pleasant   ASSESSMENT & PLAN:    PAF (paroxysmal atrial fibrillation) (HCC) Maintaining NSR, on eliquis, , metoprolol We will stop the amiodarone given his thyroid disorder Required cardioversion for atrial fibrillation 2018, no atrial fibrillation since that time  Chronic heart failure with preserved ejection fraction (HCC) euvolemic  Recommend he continue Lasix daily  Chronic respiratory distress/COPD Chronic cough in the morning consistent with chronic bronchitis No recent hospitalizations On oxygen at night  Mixed hyperlipidemia On statin Goal LDL less than 70 Stressed importance of diet changes, calorie restriction given he is opposed to 12 pounds in the past year.   HYPERTENSION, BENIGN ESSENTIAL Blood pressure is well controlled on today's visit. No  changes made to the medications. Stable  Hypothyroidism We will hold amiodarone Recheck TSH today   Disposition: Follow-up in 12 months   Signed, Ida Rogue, MD  12/24/2019 12:05 PM    [place] Office [address], [place], [place] [address]

## 2019-12-22 ENCOUNTER — Other Ambulatory Visit: Payer: Self-pay | Admitting: Internal Medicine

## 2019-12-22 DIAGNOSIS — J189 Pneumonia, unspecified organism: Secondary | ICD-10-CM | POA: Diagnosis not present

## 2019-12-22 DIAGNOSIS — J9601 Acute respiratory failure with hypoxia: Secondary | ICD-10-CM | POA: Diagnosis not present

## 2019-12-24 ENCOUNTER — Encounter: Payer: Self-pay | Admitting: Cardiovascular Disease

## 2019-12-24 ENCOUNTER — Ambulatory Visit (INDEPENDENT_AMBULATORY_CARE_PROVIDER_SITE_OTHER): Payer: PPO | Admitting: Cardiovascular Disease

## 2019-12-24 ENCOUNTER — Other Ambulatory Visit: Payer: Self-pay

## 2019-12-24 VITALS — BP 134/76 | HR 61 | Ht 66.0 in | Wt 199.2 lb

## 2019-12-24 DIAGNOSIS — R55 Syncope and collapse: Secondary | ICD-10-CM | POA: Diagnosis not present

## 2019-12-24 DIAGNOSIS — N1832 Chronic kidney disease, stage 3b: Secondary | ICD-10-CM | POA: Diagnosis not present

## 2019-12-24 DIAGNOSIS — I5032 Chronic diastolic (congestive) heart failure: Secondary | ICD-10-CM

## 2019-12-24 DIAGNOSIS — E782 Mixed hyperlipidemia: Secondary | ICD-10-CM | POA: Diagnosis not present

## 2019-12-24 DIAGNOSIS — I480 Paroxysmal atrial fibrillation: Secondary | ICD-10-CM | POA: Diagnosis not present

## 2019-12-24 DIAGNOSIS — I10 Essential (primary) hypertension: Secondary | ICD-10-CM | POA: Diagnosis not present

## 2019-12-24 DIAGNOSIS — R42 Dizziness and giddiness: Secondary | ICD-10-CM

## 2019-12-24 DIAGNOSIS — J9621 Acute and chronic respiratory failure with hypoxia: Secondary | ICD-10-CM | POA: Diagnosis not present

## 2019-12-24 NOTE — Patient Instructions (Addendum)
TSH today  Medication Instructions:  Hold the amiodarone  If you need a refill on your cardiac medications before your next appointment, please call your pharmacy.    Lab work: TSH   If you have labs (blood work) drawn today and your tests are completely normal, you will receive your results only by: Marland Kitchen MyChart Message (if you have MyChart) OR . A paper copy in the mail If you have any lab test that is abnormal or we need to change your treatment, we will call you to review the results.   Testing/Procedures: No new testing needed   Follow-Up: At [facility], you and your health needs are our priority.  As part of our continuing mission to provide you with exceptional heart care, we have created designated Provider Care Teams.  These Care Teams include your primary Cardiologist (physician) and Advanced Practice Providers (APPs -  Physician Assistants and Nurse Practitioners) who all work together to provide you with the care you need, when you need it.  . You will need a follow up appointment in 12 months   . Providers on your designated Care Team:   . Murray Hodgkins, NP . Christell Faith, PA-C . Marrianne Mood, PA-C  Any Other Special Instructions Will Be Listed Below (If Applicable).  For educational health videos Log in to : [email or web address] Or : [email or web address], password : [email or web address]

## 2019-12-25 LAB — TSH: TSH: 15.3 u[IU]/mL — ABNORMAL HIGH (ref 0.450–4.500)

## 2019-12-30 ENCOUNTER — Other Ambulatory Visit: Payer: Self-pay | Admitting: Internal Medicine

## 2019-12-30 DIAGNOSIS — E039 Hypothyroidism, unspecified: Secondary | ICD-10-CM

## 2020-01-02 ENCOUNTER — Telehealth: Payer: Self-pay | Admitting: Cardiovascular Disease

## 2020-01-02 NOTE — Telephone Encounter (Signed)
Patient called back in to review test results. TSH still elevated and advised that I would forward those results to his PCP. He verbalized understanding with no further questions at this time.

## 2020-01-02 NOTE — Telephone Encounter (Signed)
Patient would like to be called regarding his recent blood work results  He would like the results be sent to his PCP  Please advise

## 2020-01-02 NOTE — Telephone Encounter (Signed)
Patient has follow up lab appt at [facility].

## 2020-01-07 NOTE — Telephone Encounter (Signed)
Spoke with patients daughter per release form and she wanted to follow up on abnormal lab results. Reviewed that I did forward results to patients PCP as well and she did mention he has some upcoming labs ordered at his facility. She verbalized understanding with no further questions at this time.

## 2020-01-07 NOTE — Telephone Encounter (Signed)
Daughter calling in to clear up confusion between offices regarding blood work.  Please call when able

## 2020-01-08 NOTE — Telephone Encounter (Signed)
Pts daughter calling and said that you have not received his TSH lab value we forwarded to you. Just wanted to check and see if you did see that.  Thanks

## 2020-01-08 NOTE — Telephone Encounter (Signed)
Patient daughter calling back in stating that [facility] office has still not received information. Daughter would like it to be re-faxed  Please call daughter if needed

## 2020-01-08 NOTE — Telephone Encounter (Signed)
Spoke with daughter per release form and reviewed that provider did receive lab level and have him scheduled to have repeat labs as well. She verbalized understanding and was appreciative for the call back with follow up information.

## 2020-01-08 NOTE — Telephone Encounter (Signed)
We did receive it.  I was wanting him to come back to our office for a repeat TSH to see if it got better after coming off the amiodarone.

## 2020-01-11 ENCOUNTER — Other Ambulatory Visit: Payer: Self-pay | Admitting: Internal Medicine

## 2020-01-11 DIAGNOSIS — E039 Hypothyroidism, unspecified: Secondary | ICD-10-CM

## 2020-01-17 ENCOUNTER — Other Ambulatory Visit: Payer: Self-pay | Admitting: Cardiovascular Disease

## 2020-01-22 DIAGNOSIS — J189 Pneumonia, unspecified organism: Secondary | ICD-10-CM | POA: Diagnosis not present

## 2020-01-22 DIAGNOSIS — J9601 Acute respiratory failure with hypoxia: Secondary | ICD-10-CM | POA: Diagnosis not present

## 2020-01-23 DIAGNOSIS — R062 Wheezing: Secondary | ICD-10-CM | POA: Diagnosis not present

## 2020-01-23 DIAGNOSIS — I5032 Chronic diastolic (congestive) heart failure: Secondary | ICD-10-CM | POA: Diagnosis not present

## 2020-01-27 ENCOUNTER — Other Ambulatory Visit: Payer: Self-pay

## 2020-01-27 ENCOUNTER — Other Ambulatory Visit: Payer: PPO

## 2020-01-27 DIAGNOSIS — R7989 Other specified abnormal findings of blood chemistry: Secondary | ICD-10-CM

## 2020-01-27 DIAGNOSIS — E039 Hypothyroidism, unspecified: Secondary | ICD-10-CM

## 2020-01-27 DIAGNOSIS — R945 Abnormal results of liver function studies: Secondary | ICD-10-CM

## 2020-01-28 ENCOUNTER — Other Ambulatory Visit: Payer: Self-pay | Admitting: Nurse Practitioner

## 2020-01-28 DIAGNOSIS — E039 Hypothyroidism, unspecified: Secondary | ICD-10-CM

## 2020-01-28 LAB — TSH: TSH: 16.42 mIU/L — ABNORMAL HIGH (ref 0.40–4.50)

## 2020-01-28 MED ORDER — LEVOTHYROXINE SODIUM 50 MCG PO TABS: 50.0000 ug | ORAL_TABLET | Freq: Every day | ORAL | 0 refills | Status: DC

## 2020-02-06 ENCOUNTER — Ambulatory Visit: Payer: PPO | Admitting: Internal Medicine

## 2020-02-06 ENCOUNTER — Other Ambulatory Visit: Payer: Self-pay

## 2020-02-06 ENCOUNTER — Encounter: Payer: Self-pay | Admitting: Internal Medicine

## 2020-02-06 ENCOUNTER — Ambulatory Visit (INDEPENDENT_AMBULATORY_CARE_PROVIDER_SITE_OTHER): Payer: PPO | Admitting: Internal Medicine

## 2020-02-06 VITALS — BP 134/74 | HR 60 | Temp 97.8°F | Ht 66.0 in | Wt 207.4 lb

## 2020-02-06 DIAGNOSIS — I5032 Chronic diastolic (congestive) heart failure: Secondary | ICD-10-CM

## 2020-02-06 DIAGNOSIS — I480 Paroxysmal atrial fibrillation: Secondary | ICD-10-CM | POA: Diagnosis not present

## 2020-02-06 DIAGNOSIS — R221 Localized swelling, mass and lump, neck: Secondary | ICD-10-CM | POA: Diagnosis not present

## 2020-02-06 DIAGNOSIS — R1312 Dysphagia, oropharyngeal phase: Secondary | ICD-10-CM | POA: Diagnosis not present

## 2020-02-06 DIAGNOSIS — E039 Hypothyroidism, unspecified: Secondary | ICD-10-CM

## 2020-02-06 DIAGNOSIS — J9621 Acute and chronic respiratory failure with hypoxia: Secondary | ICD-10-CM

## 2020-02-06 MED ORDER — BREO ELLIPTA 100-25 MCG/INH IN AEPB: 1.0000 | INHALATION_SPRAY | Freq: Every day | RESPIRATORY_TRACT | 1 refills | Status: DC

## 2020-02-06 NOTE — Progress Notes (Signed)
Location:  [facility] clinic Provider: Kedra Mcglade L. Mariea Clonts, D.O., C.M.D.  Goals of Care:  Advanced Directives 02/06/2020  Does Patient Have a Medical Advance Directive? No  Type of Advance Directive -  Does patient want to make changes to medical advance directive? -  Copy of [place] in Chart? -  Would patient like information on creating a medical advance directive? No - Patient declined   Chief Complaint  Patient presents with  . Acute Visit    Patient states thyroid is making him short of breath, felt possibly a knot , and also states feels like choking when eating. States duration since December. Patient states short of breath and coughing and is not sure where its coming from.     HPI: Patient is a 86 y.o. male seen today for an acute visit for concerns about a possible thyroid nodule, increased difficulty with his breathing, choking when eating.    He noted yesterday that he has a spot in his left neck that "seemed swolled."  He knows of a friend who had their thyroid taken out and another with thyroid cancer so he's worried about that.    He's been having difficulty swallowing his breakfast.  Had trouble with swallowing beginning after his intubation and neck surgery a few years ago.  He eats cereal with milk and sometimes a banana at breakfasts and he feels like pieces of the cereal stick in the back, cause a tickle and make him choke/cough.  It does not seem to happen at other meals.    He has not seen a dentist in years and dentition is poor.  He has a chronic area of discomfort over his left upper jaw/cheek.    He gets dyspneic with minimal exertion.  His feet are not anymore swollen (no pitting at present).  Weight has essentially been trending up since fall of 2019 and since his mobility has declined.  He takes his lasix on all days except if he goes somewhere which he had not been anywhere [place]  He uses his breo only as  needed not routinely and has not been using his nebulizer either.    Past Medical History:  Diagnosis Date  . Anxiety   . Arthritis   . Atypical chest pain    a. 05/2017 MV: no ischemia, EF 79%.  . Chest pain 10/20/2017  . Chronic diastolic CHF (congestive heart failure) ([place])    a. 01/2011 Echo: EF 50-55%, gr1 DD, mild AI, nl RV fxn, mild TR/PR; b. 10/2017 Echo: EF 60-65%, mild LVH, gr2 DD.  Marland Kitchen DDD (degenerative disc disease), cervical   . Depressive disorder, not elsewhere classified   . Difficult intubation   . Dysphagia, oral phase   . Dyspnea   . Edema   . Gallstones    a. Symptomatic - s/p lap chole 05/2018.  Marland Kitchen History of DVT (deep vein thrombosis)   . History of kidney stones   . Hypertension   . Hypoxemia   . Impacted cerumen   . Ischemic colitis ([place])    a. 02/2018 GIB - colonoscopy w/ isch colitis. Anticoagulation resumed.  . Long term current use of anticoagulant 03/02/2011  . LOW BACK PAIN SYNDROME 03/17/2009   Qualifier: Diagnosis of  By: Council Mechanic MD, Hilaria Ota   . Mixed hyperlipidemia   . 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 Other myelopathy   . Pain in  limb   . Palpitations   . Paroxysmal Atrial Fibrillation ([place])    a. a. 01/2011 in setting of post-op complications including aspiration pna;  b. CHA2DS2VASc = 4--> Amio/Eliquis.  . Pneumonia 03/06/2003  . Spinal stenosis, unspecified region other than cervical   . 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 Syncope    a. 10/2017-->Event monitor: RSR, rare PACs/PVCs.  . Thoracic or lumbosacral neuritis or radiculitis, unspecified     Past Surgical History:  Procedure Laterality Date  . CARDIOVERSION N/A 06/20/2017   Procedure: CARDIOVERSION;  Surgeon: [name], MD;  Location: [facility] ORS;  Service: Cardiovascular;  Laterality: N/A;  . CATARACT EXTRACTION W/ INTRAOCULAR LENS  IMPLANT, BILATERAL    . CERVICAL FUSION  02/10/2011  .  CHOLECYSTECTOMY  05/24/2018  . CHOLECYSTECTOMY N/A 05/24/2018   Procedure: LAPAROSCOPIC CHOLECYSTECTOMY;  Surgeon: Coralie Keens, MD;  Location: [place];  Service: General;  Laterality: N/A;  . COLONOSCOPY WITH PROPOFOL N/A 02/20/2018   Procedure: COLONOSCOPY WITH PROPOFOL;  Surgeon: Lucilla Lame, MD;  Location: [facility] END[facility]COPY;  Service: Endoscopy;  Laterality: N/A;  . history of abd ultrasound  11/01   fatty liver  . MULTIPLE TOOTH EXTRACTIONS    . ORIF FIBULA FRACTURE Left 01/06/2017   Procedure: OPEN REDUCTION INTERNAL FIXATION (ORIF) FIBULA FRACTURE DISTAL FIBULA;  Surgeon: Melrose Nakayama, MD;  Location: [place];  Service: Orthopedics;  Laterality: Left;  Patient states has problems if he will have a tube in throat for Genera; Anesthesia    Allergies  Allergen Reactions  . Morphine And Related Shortness Of Breath  . Percocet [Oxycodone-Acetaminophen] Shortness Of Breath  . Valium Shortness Of Breath    Outpatient Encounter Medications as of 02/06/2020  Medication Sig  . albuterol (PROVENTIL) (2.5 MG/3ML) 0.083% nebulizer solution Take 3 mLs (2.5 mg total) by nebulization 2 (two) times daily as needed for up to 30 doses for wheezing or shortness of breath.  Marland Kitchen apixaban (ELIQUIS) 5 MG TABS tablet Take 1 tablet (5 mg total) by mouth 2 (two) times daily.  . cetirizine (ZYRTEC) 10 MG tablet TAKE 1 TABLET BY MOUTH EVERY DAY  . Cholecalciferol (VITAMIN D) 1000 UNITS capsule Take one tablet once daily  . fluticasone (FLONASE) 50 MCG/ACT nasal spray SHAKE LIQUID AND USE 2 SPRAYS IN EACH NOSTRIL DAILY  . fluticasone furoate-vilanterol (BREO ELLIPTA) 100-25 MCG/INH AEPB Inhale 1 puff into the lungs daily.  . furosemide (LASIX) 40 MG tablet Take 1 tablet (40 mg total) by mouth daily.  Marland Kitchen gabapentin (NEURONTIN) 300 MG capsule Take 1 capsule (300 mg total) by mouth 2 (two) times daily.  Marland Kitchen levothyroxine (SYNTHROID) 50 MCG tablet Take 1 tablet (50 mcg total) by mouth daily before breakfast.  . metoprolol  tartrate (LOPRESSOR) 25 MG tablet TAKE 1 TABLET(25 MG) BY MOUTH TWICE DAILY  . OXYGEN Inhale 2 L into the lungs at bedtime.  . pantoprazole (PROTONIX) 40 MG tablet TAKE 1 TABLET(40 MG) BY MOUTH AT BEDTIME  . potassium chloride SA (K-DUR,KLOR-CON) 10 MEQ tablet Take 1 tablet (10 mEq total) by mouth daily.  . pravastatin (PRAVACHOL) 40 MG tablet Take 1 tablet (40 mg total) by mouth daily.  . [DISCONTINUED] furosemide (LASIX) 40 MG tablet TAKE 1 TABLET(40 MG) BY MOUTH DAILY  . [DISCONTINUED] metoprolol tartrate (LOPRESSOR) 25 MG tablet TAKE 1 TABLET(25 MG) BY MOUTH TWICE DAILY  . [DISCONTINUED] pantoprazole (PROTONIX) 40 MG tablet TAKE 1 TABLET(40 MG) BY MOUTH AT BEDTIME  No facility-administered encounter medications on file as of 02/06/2020.    Review of Systems:  Review of Systems  Constitutional: Positive for malaise/fatigue. Negative for chills and fever.  HENT: Positive for hearing loss. Negative for sore throat.        Bump left neck  Eyes: Negative for blurred vision.       Glasses  Respiratory: Positive for shortness of breath and wheezing. Negative for cough and sputum production.   Cardiovascular: Positive for leg swelling. Negative for chest pain, palpitations and orthopnea.       Dyspnea on exertion (like walking to restroom in house)  Gastrointestinal: Negative for abdominal pain and heartburn.       Dysphagia at breakfast  Genitourinary: Negative for dysuria.  Musculoskeletal: Positive for back pain, joint pain and neck pain. Negative for falls.  Neurological: Negative for dizziness and loss of consciousness.  Endo/Heme/Allergies: Bruises/bleeds easily.  Psychiatric/Behavioral: Negative for depression. The patient is nervous/anxious.     Health Maintenance  Topic Date Due  . COLONOSCOPY  02/20/2021  . TETANUS/TDAP  10/15/2023  . INFLUENZA VACCINE  Completed  . PNA vac Low Risk Adult  Completed    Physical Exam: Vitals:   02/06/20 1005  BP: 134/74  Pulse: 60    Temp: 97.8 F (36.6 C)  TempSrc: Temporal  SpO2: 96%  Weight: 207 lb 6.4 oz (94.1 kg)  Height: 5\' 6"  (1.676 m)   Body mass index is 33.48 kg/m. Physical Exam Vitals reviewed.  Constitutional:      General: He is not in acute distress.    Appearance: He is not toxic-appearing.  HENT:     Head: Normocephalic and atraumatic.     Mouth/Throat:     Comments: Tender over left upper jaw Neck:     Comments: Palpable lymphadenopathy of left neck beneath chin; no palpable nodules in thyroid  Cardiovascular:     Rate and Rhythm: Rhythm irregular.     Heart sounds: No friction rub. No gallop.   Pulmonary:     Effort: Pulmonary effort is normal.     Breath sounds: Wheezing and rhonchi present. No rales.  Abdominal:     General: Bowel sounds are normal.  Musculoskeletal:        General: Normal range of motion.     Right lower leg: Edema present.     Left lower leg: Edema present.     Comments: No pitting edema  Skin:    General: Skin is warm and dry.  Neurological:     General: No focal deficit present.     Mental Status: He is alert and oriented to person, place, and time.     Gait: Gait abnormal.  Psychiatric:        Mood and Affect: Mood normal.     Labs reviewed: Basic Metabolic Panel: Recent Labs    05/01/19 1011 05/27/19 1031 10/11/19 1215 12/24/19 1226 01/27/20 1019  NA 141 141 144  --   --   K 4.2 4.6 4.6  --   --   CL 103 103 105  --   --   CO2 31 27 32  --   --   GLUCOSE 88 91 84  --   --   BUN 20 22 29*  --   --   CREATININE 1.13* 1.45* 1.39*  --   --   CALCIUM 9.2 9.6 9.6  --   --   TSH  --   --  21.51* 15.300* 16.42*  Liver Function Tests: Recent Labs    02/27/19 0145 02/27/19 0145 03/01/19 0241 03/01/19 0241 05/01/19 1011 05/27/19 1031 10/11/19 1215  AST 87*   < > 86*   < > 69* 63* 59*  ALT 61*   < > 67*   < > 55* 51* 49*  ALKPHOS 58  --  42  --   --   --   --   BILITOT 1.2   < > 0.8   < > 0.9 0.8 1.0  PROT 7.4   < > 6.5   < > 7.0 7.3  7.1  ALBUMIN 4.3  --  3.5  --   --   --   --    < > = values in this interval not displayed.   No results for input(s): LIPASE, AMYLASE in the last 8760 hours. No results for input(s): AMMONIA in the last 8760 hours. CBC: Recent Labs    02/27/19 0145 02/28/19 0059 03/02/19 0222 05/01/19 1011 10/11/19 1215  WBC 13.0*   < > 8.9 5.2 6.6  NEUTROABS 7.0  --   --  2,777 2,990  HGB 15.2   < > 12.0* 14.3 14.2  HCT 47.4   < > 37.7* 43.0 42.4  MCV 100.0   < > 98.7 95.8 95.9  PLT 204   < > 191 220 192   < > = values in this interval not displayed.   Lipid Panel: Recent Labs    05/27/19 1031 10/11/19 1215  CHOL 139 136  HDL 35* 34*  LDLCALC 72 69  TRIG 228* 237*  CHOLHDL 4.0 4.0   Lab Results  Component Value Date   HGBA1C 5.7 (H) 05/16/2016   Assessment/Plan 1. Mass of left side of neck - I feel lymphadenopathy left neck in cervical and submandibular region, but he is saying he feels something in his thyroid - check imaging of neck to see about this and also due to dysphagia/things getting stuck in upper throat - US SOFT TISSUE HEAD & NECK (NON-THYROID); Future  2. Hypothyroidism, unspecified type - it's been hard to get thyroid med adjusted; NP E[place] managing this primarily now -he'd actually been on amiodarone which [facility] have been working against [place] with this but now off - US SOFT TISSUE HEAD & NECK (NON-THYROID); Future  3. Chronic diastolic CHF (congestive heart failure) ([facility]) -though weight has been trending up, I don't hear rales or see jvd or pitting edema at this point -maintain lasix regimen--I asked him to be sure to take the lasix later in the morning if he misses it first thing -he is not going to take it if it's after lunch if he misses it due to appts b/c then he'll be up urinating at night  4. PAF (paroxysmal atrial fibrillation) ([facility]) -cont lopressor for rate control and eliquis anticoagulation -followed by cardiology  5. Oropharyngeal dysphagia -has  had some problem since neck surgery, but recently worse -check neck imaging first based on story he's telling, but [facility] need repeat barium swallow/speech pathology eval  6. Acute on chronic respiratory failure with hypoxia ([facility]) - needs renewal of breo and says he will now take regularly - fluticasone furoate-vilanterol (BREO ELLIPTA) 100-25 MCG/INH AEPB; Inhale 1 puff into the lungs daily.  Dispense: 28 each; Refill: 1 -[place] also use nebs bid prn sob and was reminded of this verbally more than once and in AVS  Labs/tests ordered:   Orders Placed This Encounter  Procedures  . US SOFT TISSUE HEAD &  NECK (NON-THYROID)    Standing Status:   Future    Standing Expiration Date:   04/05/2021    Scheduling Instructions:     Call Jenny Reichmann to schedule [phone]    Order Specific Question:   Reason for Exam (SYMPTOM  OR DIAGNOSIS REQUIRED)    Answer:   swollen area on left neck, abnormal thyroid tests, dysphagia    Order Specific Question:   Preferred imaging location?    Answer:   [facility]    Order Specific Question:   Release to patient    Answer:   Immediate   40 mins spent with patient and his daughter today.  I had to review a lot of information b/c I'd not seen him myself for almost a year  Next appt:  03/11/2020  Hao Dion L. Laretta Pyatt, D.O. [facility] [address], [place] [address] Cell Phone (Mon-Fri 8am-5pm):  [phone] On Call:  [phone] & follow prompts after 5pm & weekends Office Phone:  [phone] Office Fax:  [phone]

## 2020-02-06 NOTE — Patient Instructions (Addendum)
We'll check an ultrasound of your neck to see if that area on the left side is lymph nodes and to make sure your thyroid is ok.  This Thiem also help give [place] information about your swallowing.  Be sure to take your breo daily and use the nebulizer if needed for shortness of breath.

## 2020-02-16 ENCOUNTER — Other Ambulatory Visit: Payer: Self-pay | Admitting: Cardiovascular Disease

## 2020-02-17 ENCOUNTER — Ambulatory Visit
Admission: RE | Admit: 2020-02-17 | Discharge: 2020-02-17 | Disposition: A | Discharge status: Home / Self Care | Payer: PPO | Source: Ambulatory Visit | Attending: Internal Medicine | Admitting: Internal Medicine

## 2020-02-17 ENCOUNTER — Other Ambulatory Visit: Payer: Self-pay

## 2020-02-17 DIAGNOSIS — R221 Localized swelling, mass and lump, neck: Secondary | ICD-10-CM

## 2020-02-17 DIAGNOSIS — E039 Hypothyroidism, unspecified: Secondary | ICD-10-CM

## 2020-02-17 NOTE — Progress Notes (Signed)
Thyroid does not have cysts or nodules in it which is good news.  Hopefully, the area of concern was included in his left upper neck.

## 2020-02-19 DIAGNOSIS — J9601 Acute respiratory failure with hypoxia: Secondary | ICD-10-CM | POA: Diagnosis not present

## 2020-02-19 DIAGNOSIS — J189 Pneumonia, unspecified organism: Secondary | ICD-10-CM | POA: Diagnosis not present

## 2020-02-21 DIAGNOSIS — I5032 Chronic diastolic (congestive) heart failure: Secondary | ICD-10-CM | POA: Diagnosis not present

## 2020-02-21 DIAGNOSIS — R062 Wheezing: Secondary | ICD-10-CM | POA: Diagnosis not present

## 2020-02-27 DIAGNOSIS — Z961 Presence of intraocular lens: Secondary | ICD-10-CM | POA: Diagnosis not present

## 2020-02-28 ENCOUNTER — Other Ambulatory Visit: Payer: Self-pay | Admitting: Internal Medicine

## 2020-02-28 DIAGNOSIS — I5033 Acute on chronic diastolic (congestive) heart failure: Secondary | ICD-10-CM

## 2020-02-28 NOTE — Telephone Encounter (Signed)
Ok to fill? Last refilled 2019 by Dr. Rockey Situ

## 2020-03-11 ENCOUNTER — Encounter: Payer: Self-pay | Admitting: Nurse Practitioner

## 2020-03-11 ENCOUNTER — Other Ambulatory Visit: Payer: Self-pay

## 2020-03-11 ENCOUNTER — Ambulatory Visit (INDEPENDENT_AMBULATORY_CARE_PROVIDER_SITE_OTHER): Payer: PPO | Admitting: Nurse Practitioner

## 2020-03-11 ENCOUNTER — Other Ambulatory Visit (HOSPITAL_COMMUNITY): Payer: Self-pay

## 2020-03-11 VITALS — BP 128/80 | HR 73 | Temp 96.9°F | Ht 66.0 in | Wt 204.0 lb

## 2020-03-11 DIAGNOSIS — N1832 Chronic kidney disease, stage 3b: Secondary | ICD-10-CM | POA: Diagnosis not present

## 2020-03-11 DIAGNOSIS — M48062 Spinal stenosis, lumbar region with neurogenic claudication: Secondary | ICD-10-CM | POA: Diagnosis not present

## 2020-03-11 DIAGNOSIS — J4489 Other specified chronic obstructive pulmonary disease: Secondary | ICD-10-CM

## 2020-03-11 DIAGNOSIS — E039 Hypothyroidism, unspecified: Secondary | ICD-10-CM | POA: Diagnosis not present

## 2020-03-11 DIAGNOSIS — M5441 Lumbago with sciatica, right side: Secondary | ICD-10-CM

## 2020-03-11 DIAGNOSIS — J449 Chronic obstructive pulmonary disease, unspecified: Secondary | ICD-10-CM | POA: Insufficient documentation

## 2020-03-11 DIAGNOSIS — M5442 Lumbago with sciatica, left side: Secondary | ICD-10-CM

## 2020-03-11 DIAGNOSIS — I480 Paroxysmal atrial fibrillation: Secondary | ICD-10-CM | POA: Diagnosis not present

## 2020-03-11 DIAGNOSIS — E782 Mixed hyperlipidemia: Secondary | ICD-10-CM | POA: Diagnosis not present

## 2020-03-11 DIAGNOSIS — R05 Cough: Secondary | ICD-10-CM

## 2020-03-11 DIAGNOSIS — R7989 Other specified abnormal findings of blood chemistry: Secondary | ICD-10-CM

## 2020-03-11 DIAGNOSIS — G3281 Cerebellar ataxia in diseases classified elsewhere: Secondary | ICD-10-CM | POA: Diagnosis not present

## 2020-03-11 DIAGNOSIS — I5032 Chronic diastolic (congestive) heart failure: Secondary | ICD-10-CM | POA: Diagnosis not present

## 2020-03-11 DIAGNOSIS — R053 Chronic cough: Secondary | ICD-10-CM

## 2020-03-11 DIAGNOSIS — G8929 Other chronic pain: Secondary | ICD-10-CM

## 2020-03-11 DIAGNOSIS — R1312 Dysphagia, oropharyngeal phase: Secondary | ICD-10-CM | POA: Diagnosis not present

## 2020-03-11 DIAGNOSIS — R1310 Dysphagia, unspecified: Secondary | ICD-10-CM

## 2020-03-11 MED ORDER — GABAPENTIN 300 MG PO CAPS: 300.0000 mg | ORAL_CAPSULE | Freq: Two times a day (BID) | ORAL | 1 refills | Status: DC

## 2020-03-11 NOTE — Progress Notes (Signed)
Careteam: Patient Care Team: [name], DO as PCP - General (Geriatric Medicine) [name], MD as PCP - Cardiology (Cardiology) [name], MD as Consulting Physician (Cardiology) Normajean Glasgow, MD as Attending Physician (Physical Medicine and Rehabilitation) Phylliss Bob, MD as Consulting Physician (Orthopedic Surgery) Wilhelmina Mcardle, MD (Inactive) as Consulting Physician (Pulmonary Disease)  PLACE OF SERVICE:  [place] Directive information Does Patient Have a Medical Advance Directive?: Yes, Type of Advance Directive: [place], Does patient want to make changes to medical advance directive?: No - Patient declined  Allergies  Allergen Reactions  . Morphine And Related Shortness Of Breath  . Percocet [Oxycodone-Acetaminophen] Shortness Of Breath  . Valium Shortness Of Breath    Chief Complaint  Patient presents with  . Medical Management of Chronic Issues    5 month follow-up. Here with daughter Jenny Reichmann   . Shortness of Breath    Ongoing cough and shortness of breath x 2-3 years. Patient gets choaked when eating and sometimes feels like he is going to vomit.   . Gait Problem    Ongoing walking issues      HPI: Patient is a 86 y.o. male   Reports when he is eating cereal he feels like something is in the back of his throat and then he starts coughing and feels like he is going to throw up. Whole face turns red.   Hypothyroid- 6 weeks ago was increased to 50 mcg  Afib- at last check was in NSR, continues to follow up with cardiologist, no chest pains. Chronic shortness of breath, ongoing  Cough, chronic- following with pulmonary, continues on breo and nebulizer as needed, has follow up in April.   Daughter reports he spends most the time sitting, does not elevate legs like he should. Does exercises that therapist gave him before.   Review of Systems:  Review of Systems  Constitutional: Negative for chills, fever and  weight loss.  HENT: Positive for hearing loss.   Respiratory: Positive for cough and shortness of breath. Negative for sputum production.   Cardiovascular: Positive for leg swelling. Negative for chest pain and palpitations.  Gastrointestinal: Negative for abdominal pain, constipation, diarrhea and heartburn.  Genitourinary: Negative for dysuria, frequency and urgency.  Musculoskeletal: Positive for back pain. Negative for falls, joint pain and myalgias.  Skin: Negative.   Neurological: Positive for tingling and sensory change. Negative for dizziness and headaches.  Psychiatric/Behavioral: Negative for depression and memory loss. The patient does not have insomnia.     Past Medical History:  Diagnosis Date  . Anxiety   . Arthritis   . Atypical chest pain    a. 05/2017 MV: no ischemia, EF 79%.  . Chest pain 10/20/2017  . Chronic diastolic CHF (congestive heart failure) ([place])    a. 01/2011 Echo: EF 50-55%, gr1 DD, mild AI, nl RV fxn, mild TR/PR; b. 10/2017 Echo: EF 60-65%, mild LVH, gr2 DD.  Marland Kitchen DDD (degenerative disc disease), cervical   . Depressive disorder, not elsewhere classified   . Difficult intubation   . Dysphagia, oral phase   . Dyspnea   . Edema   . Gallstones    a. Symptomatic - s/p lap chole 05/2018.  Marland Kitchen History of DVT (deep vein thrombosis)   . History of kidney stones   . Hypertension   . Hypoxemia   . Impacted cerumen   . Ischemic colitis ([place])    a. 02/2018 GIB - colonoscopy w/ isch  colitis. Anticoagulation resumed.  . Long term current use of anticoagulant 03/02/2011  . LOW BACK PAIN SYNDROME 03/17/2009   Qualifier: Diagnosis of  By: Council Mechanic MD, Hilaria Ota   . Mixed hyperlipidemia   . 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 Other myelopathy   . Pain in limb   . Palpitations   . Paroxysmal Atrial Fibrillation ([place])    a. a. 01/2011 in setting of post-op complications including aspiration pna;  b. CHA2DS2VASc = 4--> Amio/Eliquis.  . Pneumonia  03/06/2003  . Spinal stenosis, unspecified region other than cervical   . 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 Syncope    a. 10/2017-->Event monitor: RSR, rare PACs/PVCs.  . Thoracic or lumbosacral neuritis or radiculitis, unspecified    Past Surgical History:  Procedure Laterality Date  . CARDIOVERSION N/A 06/20/2017   Procedure: CARDIOVERSION;  Surgeon: [name], MD;  Location: [facility];  Service: Cardiovascular;  Laterality: N/A;  . CATARACT EXTRACTION W/ INTRAOCULAR LENS  IMPLANT, BILATERAL    . CERVICAL FUSION  02/10/2011  . CHOLECYSTECTOMY  05/24/2018  . CHOLECYSTECTOMY N/A 05/24/2018   Procedure: LAPAROSCOPIC CHOLECYSTECTOMY;  Surgeon: Coralie Keens, MD;  Location: [facility];  Service: General;  Laterality: N/A;  . COLONOSCOPY WITH PROPOFOL N/A 02/20/2018   Procedure: COLONOSCOPY WITH PROPOFOL;  Surgeon: Lucilla Lame, MD;  Location: [facility];  Service: Endoscopy;  Laterality: N/A;  . history of abd ultrasound  11/01   fatty liver  . MULTIPLE TOOTH EXTRACTIONS    . ORIF FIBULA FRACTURE Left 01/06/2017   Procedure: OPEN REDUCTION INTERNAL FIXATION (ORIF) FIBULA FRACTURE DISTAL FIBULA;  Surgeon: Melrose Nakayama, MD;  Location: [facility];  Service: Orthopedics;  Laterality: Left;  Patient states has problems if he will have a tube in throat for [place]; Anesthesia   Social History:   reports that he has quit smoking. He quit after 1.00 year of use. He has never used smokeless tobacco. He reports that he does not drink alcohol or use drugs.  Family History  Problem Relation Age of Onset  . Stroke Father   . Atrial fibrillation Brother        on coumadin  . Heart disease Brother        AFib- coumadin  . Stroke Paternal Grandfather   . Other Mother        hemorrhage  . Cancer Other        colon cancer at early age  . Prostate cancer Neg Hx   . Kidney cancer Neg Hx   . Bladder Cancer Neg Hx     Medications: Patient's  Medications  New Prescriptions   No medications on file  Previous Medications   ALBUTEROL (PROVENTIL) (2.5 MG/3ML) 0.083% NEBULIZER SOLUTION    Take 3 mLs (2.5 mg total) by nebulization 2 (two) times daily as needed for up to 30 doses for wheezing or shortness of breath.   APIXABAN (ELIQUIS) 5 MG TABS TABLET    Take 1 tablet (5 mg total) by mouth 2 (two) times daily.   CETIRIZINE (ZYRTEC) 10 MG TABLET    TAKE 1 TABLET BY MOUTH EVERY DAY   CHOLECALCIFEROL (VITAMIN D) 1000 UNITS CAPSULE    Take one tablet once daily   FLUTICASONE (FLONASE) 50 MCG/ACT NASAL SPRAY    SHAKE LIQUID AND USE 2 SPRAYS IN EACH NOSTRIL DAILY   FLUTICASONE FUROATE-VILANTEROL (BREO ELLIPTA) 100-25 MCG/INH AEPB  Inhale 1 puff into the lungs daily.   FUROSEMIDE (LASIX) 40 MG TABLET    Take 1 tablet (40 mg total) by mouth daily.   LEVOTHYROXINE (SYNTHROID) 50 MCG TABLET    Take 1 tablet (50 mcg total) by mouth daily before breakfast.   METOPROLOL TARTRATE (LOPRESSOR) 25 MG TABLET    TAKE 1 TABLET(25 MG) BY MOUTH TWICE DAILY   OXYGEN    Inhale 2 L into the lungs at bedtime.   PANTOPRAZOLE (PROTONIX) 40 MG TABLET    TAKE 1 TABLET(40 MG) BY MOUTH AT BEDTIME   POTASSIUM CHLORIDE SA (K-DUR,KLOR-CON) 10 MEQ TABLET    Take 1 tablet (10 mEq total) by mouth daily.   PRAVASTATIN (PRAVACHOL) 40 MG TABLET    Take 1 tablet (40 mg total) by mouth daily.  Modified Medications   Modified Medication Previous Medication   GABAPENTIN (NEURONTIN) 300 MG CAPSULE gabapentin (NEURONTIN) 300 MG capsule      Take 1 capsule (300 mg total) by mouth 2 (two) times daily.    Take 1 capsule (300 mg total) by mouth 2 (two) times daily.  Discontinued Medications   POTASSIUM CHLORIDE SA (KLOR-CON) 20 MEQ TABLET    TAKE 1/2 TABLET(10 MEQ) BY MOUTH TWICE DAILY    Physical Exam:  Vitals:   03/11/20 1111  BP: 128/80  Pulse: 73  Temp: (!) 96.9 F (36.1 C)  TempSrc: Temporal  SpO2: 96%  Weight: 204 lb (92.5 kg)  Height: 5' 6"  (1.676 m)   Body mass  index is 32.93 kg/m. Wt Readings from Last 3 Encounters:  03/11/20 204 lb (92.5 kg)  02/06/20 207 lb 6.4 oz (94.1 kg)  12/24/19 199 lb 4 oz (90.4 kg)    Physical Exam Constitutional:      General: He is not in acute distress.    Appearance: He is well-developed. He is not diaphoretic.  HENT:     Head: Normocephalic and atraumatic.     Mouth/Throat:     Pharynx: No oropharyngeal exudate.  Eyes:     Conjunctiva/sclera: Conjunctivae normal.     Pupils: Pupils are equal, round, and reactive to light.  Cardiovascular:     Rate and Rhythm: Normal rate and regular rhythm.     Heart sounds: Normal heart sounds.  Pulmonary:     Effort: Pulmonary effort is normal.     Breath sounds: Decreased breath sounds present.  Abdominal:     General: Bowel sounds are normal.     Palpations: Abdomen is soft.  Musculoskeletal:        General: No tenderness.     Cervical back: Normal range of motion and neck supple.     Right lower leg: Edema (1+ ) present.     Left lower leg: Edema (1+) present.  Skin:    General: Skin is warm and dry.  Neurological:     Mental Status: He is alert and oriented to person, place, and time.     Motor: Weakness present.     Gait: Gait abnormal.  Psychiatric:        Mood and Affect: Mood normal.        Behavior: Behavior normal.     Labs reviewed: Basic Metabolic Panel: Recent Labs    05/01/19 1011 05/27/19 1031 10/11/19 1215 12/24/19 1226 01/27/20 1019  NA 141 141 144  --   --   K 4.2 4.6 4.6  --   --   CL 103 103 105  --   --  CO2 31 27 32  --   --   GLUCOSE 88 91 84  --   --   BUN 20 22 29*  --   --   CREATININE 1.13* 1.45* 1.39*  --   --   CALCIUM 9.2 9.6 9.6  --   --   TSH  --   --  21.51* 15.300* 16.42*   Liver Function Tests: Recent Labs    05/01/19 1011 05/27/19 1031 10/11/19 1215  AST 69* 63* 59*  ALT 55* 51* 49*  BILITOT 0.9 0.8 1.0  PROT 7.0 7.3 7.1   No results for input(s): LIPASE, AMYLASE in the last 8760 hours. No  results for input(s): AMMONIA in the last 8760 hours. CBC: Recent Labs    05/01/19 1011 10/11/19 1215  WBC 5.2 6.6  NEUTROABS 2,777 2,990  HGB 14.3 14.2  HCT 43.0 42.4  MCV 95.8 95.9  PLT 220 192   Lipid Panel: Recent Labs    05/27/19 1031 10/11/19 1215  CHOL 139 136  HDL 35* 34*  LDLCALC 72 69  TRIG 228* 237*  CHOLHDL 4.0 4.0   TSH: Recent Labs    10/11/19 1215 12/24/19 1226 01/27/20 1019  TSH 21.51* 15.300* 16.42*   A1C: Lab Results  Component Value Date   HGBA1C 5.7 (H) 05/16/2016     Assessment/Plan 1. Hypothyroidism, unspecified type Synthroid was increased to 50 mcg 6 weeks ago, will follow up TSH at this time.  - TSH  2. Chronic diastolic CHF (congestive heart failure) (H[facility]) -stable, pt currently with some LE edema but overall weight is down. No increase in shortness of breath. He states LE goes and comes, does not elevate feet like he should per daughter.  Continues on lasix, lopressor   3. PAF (paroxysmal atrial fibrillation) ([facility]) -rate controlled and currently in SR, off amiodarone  on lopressor, continues on Eliquis for anticoagulation.  - CBC with Differential/Platelet  4. Oropharyngeal dysphagia Ongoing swallowing issues, had issues after surgery back in 2012 and reports these improved after therapy but now with different symptoms related to swallowing.  - SLP modified barium swallow; Future  5. Elevated LFTs - CMP with eGFR([facility])  6. Chronic cough Ongoing, following with pulmonary, recommended following up as he is overdue.   7. Stage 3b chronic kidney disease -Encourage proper hydration and to avoid NSAIDS (Aleve, Advil, Motrin, Ibuprofen)   8. Cerebellar ataxia in diseases classified elsewhere [facility]) Followed by neurology, has gait abnormality which is likely multifactorial. Continues with walker.   9. Chronic asthmatic bronchitis ([place]) Ongoing, continues on Breo with albuterol as needed. Overdue for follow up with pulmonary.  Recommended to schedule follow up due to ongoing cough with shortness of beath  10. Chronic midline low back pain with bilateral sciatica Due to spinal stenosis, controlled on gabapentin  11. Spinal stenosis, lumbar region, with neurogenic claudication Had previously been followed by ortho but does not wish to have procedures/surgery. Pain is controlled on gabapentin.   gabapentin (NEURONTIN) 300 MG capsule; Take 1 capsule (300 mg total) by mouth 2 (two) times daily.  Dispense: 180 capsule; Refill: 1  12. Mixed hyperlipidemia -continues on Pravachol. Diet is limited due to access to food, meals on wheels delivers meals once daily  - CMP with eGFR([facility]) - Lipid Panel  Next appt: 4 months  Raeanne Deschler K. [place], [facility] [phone]

## 2020-03-11 NOTE — Patient Instructions (Signed)
Make follow up appt with pulmonary clinic  Referral for speech therapy placed for further evaluation of swallowing

## 2020-03-12 ENCOUNTER — Other Ambulatory Visit: Payer: Self-pay | Admitting: Internal Medicine

## 2020-03-12 DIAGNOSIS — G8929 Other chronic pain: Secondary | ICD-10-CM

## 2020-03-12 LAB — COMPLETE METABOLIC PANEL WITH GFR
AG Ratio: 1.5 (calc) (ref 1.0–2.5)
ALT: 47 U/L — ABNORMAL HIGH (ref 9–46)
AST: 65 U/L — ABNORMAL HIGH (ref 10–35)
Albumin: 4.4 g/dL (ref 3.6–5.1)
Alkaline phosphatase (APISO): 59 U/L (ref 35–144)
BUN: 24 mg/dL (ref 7–25)
CO2: 30 mmol/L (ref 20–32)
Calcium: 9.5 mg/dL (ref 8.6–10.3)
Chloride: 103 mmol/L (ref 98–110)
Creat: 1.11 mg/dL (ref 0.70–1.11)
GFR, Est African American: 70 mL/min/{1.73_m2} (ref >=60)
GFR, Est Non African American: 60 mL/min/{1.73_m2} (ref >=60)
Globulin: 2.9 g/dL (calc) (ref 1.9–3.7)
Glucose, Bld: 78 mg/dL (ref 65–99)
Potassium: 4.7 mmol/L (ref 3.5–5.3)
Sodium: 140 mmol/L (ref 135–146)
Total Bilirubin: 0.9 mg/dL (ref 0.2–1.2)
Total Protein: 7.3 g/dL (ref 6.1–8.1)

## 2020-03-12 LAB — TSH: TSH: 8.83 mIU/L — ABNORMAL HIGH (ref 0.40–4.50)

## 2020-03-12 LAB — CBC WITH DIFFERENTIAL/PLATELET
Absolute Monocytes: 506 cells/uL (ref 200–950)
Basophils Absolute: 38 cells/uL (ref 0–200)
Basophils Relative: 0.6 %
Eosinophils Absolute: 166 cells/uL (ref 15–500)
Eosinophils Relative: 2.6 %
HCT: 47.3 % (ref 38.5–50.0)
Hemoglobin: 15.2 g/dL (ref 13.2–17.1)
Lymphs Abs: 2835 cells/uL (ref 850–3900)
MCH: 31.7 pg (ref 27.0–33.0)
MCHC: 32.1 g/dL (ref 32.0–36.0)
MCV: 98.5 fL (ref 80.0–100.0)
MPV: 10.8 fL (ref 7.5–12.5)
Monocytes Relative: 7.9 %
Neutro Abs: 2854 cells/uL (ref 1500–7800)
Neutrophils Relative %: 44.6 %
Platelets: 197 10*3/uL (ref 140–400)
RBC: 4.8 10*6/uL (ref 4.20–5.80)
RDW: 12 % (ref 11.0–15.0)
Total Lymphocyte: 44.3 %
WBC: 6.4 10*3/uL (ref 3.8–10.8)

## 2020-03-12 LAB — LIPID PANEL
Cholesterol: 128 mg/dL (ref <200)
HDL: 31 mg/dL — ABNORMAL LOW (ref >=40)
LDL Cholesterol (Calc): 67 mg/dL (calc)
Non-HDL Cholesterol (Calc): 97 mg/dL (calc) (ref <130)
Total CHOL/HDL Ratio: 4.1 (calc) (ref <5.0)
Triglycerides: 242 mg/dL — ABNORMAL HIGH (ref <150)

## 2020-03-12 NOTE — Telephone Encounter (Signed)
Refill has been sent in.  

## 2020-03-16 ENCOUNTER — Encounter: Payer: Self-pay | Admitting: Nurse Practitioner

## 2020-03-16 ENCOUNTER — Ambulatory Visit (INDEPENDENT_AMBULATORY_CARE_PROVIDER_SITE_OTHER): Payer: PPO | Admitting: Nurse Practitioner

## 2020-03-16 ENCOUNTER — Telehealth: Payer: Self-pay

## 2020-03-16 ENCOUNTER — Other Ambulatory Visit: Payer: Self-pay

## 2020-03-16 DIAGNOSIS — Z0000 Encounter for general adult medical examination without abnormal findings: Secondary | ICD-10-CM | POA: Diagnosis not present

## 2020-03-16 NOTE — Telephone Encounter (Signed)
[name], Eric [name] are scheduled for a virtual visit with your provider today.    Just as we do with appointments in the office, we must obtain your consent to participate.  Your consent will be active for this visit and any virtual visit you [place] have with one of our providers in the next 365 days.    If you have a MyChart account, I can also send a copy of this consent to you electronically.  All virtual visits are billed to your insurance company just like a traditional visit in the office.  As this is a virtual visit, video technology does not allow for your provider to perform a traditional examination.  This Fout limit your provider's ability to fully assess your condition.  If your provider identifies any concerns that need to be evaluated in person or the need to arrange testing such as labs, EKG, etc, we will make arrangements to do so.    Although advances in technology are sophisticated, we cannot ensure that it will always work on either your end or our end.  If the connection with a video visit is poor, we Goold have to switch to a telephone visit.  With either a video or telephone visit, we are not always able to ensure that we have a secure connection.   I need to obtain your verbal consent now.   Are you willing to proceed with your visit today?   Eric [name] has provided verbal consent on 03/16/2020 for a virtual visit (video or telephone).   Leigh Aurora [place], [place] 03/16/2020  10:01 AM

## 2020-03-16 NOTE — Progress Notes (Signed)
Subjective:   Eric [name] is a 86 y.o. male who presents for Medicare Annual/Subsequent preventive examination.  Review of Systems:         Objective:    Vitals: There were no vitals taken for this visit.  There is no height or weight on file to calculate BMI.  Advanced Directives 03/16/2020 03/11/2020 02/06/2020 10/11/2019 03/15/2019 02/27/2019 05/24/2018  Does Patient Have a Medical Advance Directive? Yes Yes No Yes Yes No Yes  Type of Paramedic of [place];Living will [place] will Living will - Living will;Healthcare Power of Attorney  Does patient want to make changes to medical advance directive? No - Patient declined No - Patient declined - No - Patient declined No - Patient declined - No - Patient declined  Copy of [place] in Chart? No - copy requested Yes - validated most recent copy scanned in chart (See row information) - - - - No - copy requested  Would patient like information on creating a medical advance directive? - - No - Patient declined - - No - Patient declined -    Tobacco Social History   Tobacco Use  Smoking Status Former Smoker  . Years: 1.00  Smokeless Tobacco Never Used  Tobacco Comment   stopped in 20's     Counseling given: Not Answered Comment: stopped in 20's   Clinical Intake:                       Past Medical History:  Diagnosis Date  . Anxiety   . Arthritis   . Atypical chest pain    a. 05/2017 MV: no ischemia, EF 79%.  . Chest pain 10/20/2017  . Chronic diastolic CHF (congestive heart failure) ([place])    a. 01/2011 Echo: EF 50-55%, gr1 DD, mild AI, nl RV fxn, mild TR/PR; b. 10/2017 Echo: EF 60-65%, mild LVH, gr2 DD.  Marland Kitchen DDD (degenerative disc disease), cervical   . Depressive disorder, not elsewhere classified   . Difficult intubation   . Dysphagia, oral phase   . Dyspnea   . Edema   . Gallstones    a. Symptomatic - s/p lap chole 05/2018.  Marland Kitchen History of  DVT (deep vein thrombosis)   . History of kidney stones   . Hypertension   . Hypoxemia   . Impacted cerumen   . Ischemic colitis ([place])    a. 02/2018 GIB - colonoscopy w/ isch colitis. Anticoagulation resumed.  . Long term current use of anticoagulant 03/02/2011  . LOW BACK PAIN SYNDROME 03/17/2009   Qualifier: Diagnosis of  By: Council Mechanic MD, Hilaria Ota   . Mixed hyperlipidemia   . 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 Other myelopathy   . Pain in limb   . Palpitations   . Paroxysmal Atrial Fibrillation ([place])    a. a. 01/2011 in setting of post-op complications including aspiration pna;  b. CHA2DS2VASc = 4--> Amio/Eliquis.  . Pneumonia 03/06/2003  . Spinal stenosis, unspecified region other than cervical   . 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 Syncope    a. 10/2017-->Event monitor: RSR, rare PACs/PVCs.  . Thoracic or lumbosacral neuritis or radiculitis, unspecified    Past Surgical History:  Procedure Laterality Date  . CARDIOVERSION N/A 06/20/2017   Procedure: CARDIOVERSION;  Surgeon: [name], MD;  Location: [facility]  ORS;  Service: Cardiovascular;  Laterality: N/A;  . CATARACT EXTRACTION W/ INTRAOCULAR LENS  IMPLANT, BILATERAL    . CERVICAL FUSION  02/10/2011  . CHOLECYSTECTOMY  05/24/2018  . CHOLECYSTECTOMY N/A 05/24/2018   Procedure: LAPAROSCOPIC CHOLECYSTECTOMY;  Surgeon: Coralie Keens, MD;  Location: [place];  Service: General;  Laterality: N/A;  . COLONOSCOPY WITH PROPOFOL N/A 02/20/2018   Procedure: COLONOSCOPY WITH PROPOFOL;  Surgeon: Lucilla Lame, MD;  Location: [facility] ENDOSCOPY;  Service: Endoscopy;  Laterality: N/A;  . history of abd ultrasound  11/01   fatty liver  . MULTIPLE TOOTH EXTRACTIONS    . ORIF FIBULA FRACTURE Left 01/06/2017   Procedure: OPEN REDUCTION INTERNAL FIXATION (ORIF) FIBULA FRACTURE DISTAL FIBULA;  Surgeon: Melrose Nakayama, MD;  Location: [place];  Service: Orthopedics;   Laterality: Left;  Patient states has problems if he will have a tube in throat for Genera; Anesthesia   Family History  Problem Relation Age of Onset  . Stroke Father   . Atrial fibrillation Brother        on coumadin  . Heart disease Brother        AFib- coumadin  . Stroke Paternal Grandfather   . Other Mother        hemorrhage  . Cancer Other        colon cancer at early age  . Prostate cancer Neg Hx   . Kidney cancer Neg Hx   . Bladder Cancer Neg Hx    Social History   Socioeconomic History  . Marital status: Married    Spouse name: Not on file  . Number of children: 2  . Years of education: 8  . Highest education level: High school graduate  Occupational History  . 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 Smoking status: Former Smoker    Years: 1.00  . Smokeless tobacco: Never Used  . Tobacco comment: stopped in 20's  Substance and Sexual Activity  . Alcohol use: No  . Drug use: No  . Sexual activity: Not Currently  Other Topics Concern  . Not on file  Social History Narrative   Lives at home with his wife.   Left-handed (due to arthritis, he uses his right hand more now).   Caffeine use: 2 cups per day.   Social Determinants of Health   Financial Resource Strain:   . Difficulty of Paying Living Expenses:   Food Insecurity:   . Worried About Charity fundraiser in the Last Year:   . Arboriculturist in the Last Year:   Transportation Needs:   . Film/video editor (Medical):   Marland Kitchen Lack of Transportation (Non-Medical):   Physical Activity:   . Days of Exercise per Week:   . Minutes of Exercise per Session:   Stress:   . Feeling of Stress :   Social Connections:   . Frequency of Communication with Friends and Family:   . Frequency of Social Gatherings with Friends and Family:   . Attends Religious Services:   . Active Member of Clubs or Organizations:   . Attends Theatre manager Meetings:   Marland Kitchen Marital Status:     Outpatient Encounter Medications as of 03/16/2020  Medication Sig  . albuterol (PROVENTIL) (2.5 MG/3ML) 0.083% nebulizer solution Take 3 mLs (2.5 mg total) by nebulization 2 (two) times daily as needed for up to 30 doses for wheezing or shortness of  breath.  Marland Kitchen apixaban (ELIQUIS) 5 MG TABS tablet Take 1 tablet (5 mg total) by mouth 2 (two) times daily.  . cetirizine (ZYRTEC) 10 MG tablet TAKE 1 TABLET BY MOUTH EVERY DAY  . Cholecalciferol (VITAMIN D) 1000 UNITS capsule Take one tablet once daily  . fluticasone (FLONASE) 50 MCG/ACT nasal spray SHAKE LIQUID AND USE 2 SPRAYS IN EACH NOSTRIL DAILY  . fluticasone furoate-vilanterol (BREO ELLIPTA) 100-25 MCG/INH AEPB Inhale 1 puff into the lungs daily.  . furosemide (LASIX) 40 MG tablet Take 1 tablet (40 mg total) by mouth daily.  Marland Kitchen gabapentin (NEURONTIN) 300 MG capsule Take 1 capsule (300 mg total) by mouth 2 (two) times daily.  Marland Kitchen levothyroxine (SYNTHROID) 50 MCG tablet Take 1 tablet (50 mcg total) by mouth daily before breakfast.  . metoprolol tartrate (LOPRESSOR) 25 MG tablet TAKE 1 TABLET(25 MG) BY MOUTH TWICE DAILY  . OXYGEN Inhale 2 L into the lungs at bedtime.  . pantoprazole (PROTONIX) 40 MG tablet TAKE 1 TABLET(40 MG) BY MOUTH AT BEDTIME  . potassium chloride SA (K-DUR,KLOR-CON) 10 MEQ tablet Take 1 tablet (10 mEq total) by mouth daily.  . pravastatin (PRAVACHOL) 40 MG tablet Take 1 tablet (40 mg total) by mouth daily.   No facility-administered encounter medications on file as of 03/16/2020.    Activities of Daily Living No flowsheet data found.  Patient Care Team: [name], DO as PCP - General (Geriatric Medicine) [name], MD as PCP - Cardiology (Cardiology) [name], MD as Consulting Physician (Cardiology) Normajean Glasgow, MD as Attending Physician (Physical Medicine and Rehabilitation) Phylliss Bob, MD as Consulting Physician (Orthopedic Surgery) Wilhelmina Mcardle,  MD (Inactive) as Consulting Physician (Pulmonary Disease)   Assessment:   This is a routine wellness examination for Eric [name].  Exercise Activities and Dietary recommendations    Goals    . pt stated     To walk better       Fall Risk Fall Risk  03/16/2020 03/11/2020 02/06/2020 05/27/2019 05/01/2019  Falls in the past year? 0 0 0 0 0  Number falls in past yr: 0 0 0 0 0  Comment - - - - -  Injury with Fall? 0 0 0 0 0   Is the patient's home free of loose throw rugs in walkways, pet beds, electrical cords, etc?   yes      Grab bars in the bathroom? no      Handrails on the stairs?   no stairs      Adequate lighting?   yes  Timed Get Up and Go Performed: na  Depression Screen PHQ 2/9 Scores 03/16/2020 05/27/2019 05/01/2019 03/07/2019  PHQ - 2 Score 0 0 0 0  Exception Documentation - - - -    Cognitive Function MMSE - Mini Mental State Exam 03/09/2018 05/16/2016  Orientation to time 5 5  Orientation to Place 5 5  Registration 3 3  Attention/ Calculation 5 5  Recall 2 3  Language- name 2 objects 2 2  Language- repeat 1 1  Language- follow 3 step command 3 3  Language- read & follow direction 1 1  Write a sentence 1 1  Copy design 0 1  Total score 28 30     6CIT Screen 03/15/2019  What Year? 0 points  What month? 0 points  What time? 0 points  Count back from 20 2 points  Months in reverse 0 points  Repeat phrase 4 points  Total Score 6  Immunization History  Administered Date(s) Administered  . Fluad Quad(high Dose 65+) 10/11/2019  . Influenza Whole 09/11/1997, 10/11/2007, 10/06/2010  . Influenza, High Dose Seasonal PF 09/01/2017, 08/30/2018  . Influenza,inj,Quad PF,6+ Mos 10/03/2013, 09/15/2014, 09/04/2015  . Pneumococcal Conjugate-13 01/19/2015  . Pneumococcal Polysaccharide-23 03/05/2002  . Td 01/19/2003  . Tdap 10/14/2013  . Zoster 10/14/2013    Qualifies for Shingles Vaccine? Yes, recommended  Screening Tests Health Maintenance  Topic Date Due  . INFLUENZA  VACCINE  07/12/2020  . COLONOSCOPY  02/20/2021  . TETANUS/TDAP  10/15/2023  . PNA vac Low Risk Adult  Completed   Cancer Screenings: Lung: Low Dose CT Chest recommended if Age 49-80 years, 30 pack-year currently smoking OR have quit w/in 15years. Patient does not qualify. Colorectal: aged out  Additional Screenings:  Hepatitis C Screening:na      Plan:     I have personally reviewed and noted the following in the patient's chart:   . Medical and social history . Use of alcohol, tobacco or illicit drugs  . Current medications and supplements . Functional ability and status . Nutritional status . Physical activity . Advanced directives . List of other physicians . Hospitalizations, surgeries, and ER visits in previous 12 months . Vitals . Screenings to include cognitive, depression, and falls . Referrals and appointments  In addition, I have reviewed and discussed with patient certain preventive protocols, quality metrics, and best practice recommendations. A written personalized care plan for preventive services as well as general preventive health recommendations were provided to patient.     Lauree Chandler, NP  03/16/2020

## 2020-03-16 NOTE — Patient Instructions (Signed)
Eric [name] , Thank you for taking time to come for your Medicare Wellness Visit. I appreciate your ongoing commitment to your health goals. Please review the following plan we discussed and let me know if I can assist you in the future.   Screening recommendations/referrals: Colonoscopy aged out Recommended yearly ophthalmology/optometry visit for glaucoma screening and checkup Recommended yearly dental visit for hygiene and checkup  Vaccinations: Influenza vaccine up to date Pneumococcal vaccine up to date Tdap vaccine up to date Shingles vaccine no, RECOMMENDED, to get at your local pharmacy    Advanced directives: YES, recommend completing MOST form.   Conditions/risks identified: cardiovascular disease, memory loss  Next appointment: 1 year   Preventive Care 34 Years and Older, Male Preventive care refers to lifestyle choices and visits with your health care provider that can promote health and wellness. What does preventive care include?  A yearly physical exam. This is also called an annual well check.  Dental exams once or twice a year.  Routine eye exams. Ask your health care provider how often you should have your eyes checked.  Personal lifestyle choices, including:  Daily care of your teeth and gums.  Regular physical activity.  Eating a healthy diet.  Avoiding tobacco and drug use.  Limiting alcohol use.  Practicing safe sex.  Taking low doses of aspirin every day.  Taking vitamin and mineral supplements as recommended by your health care provider. What happens during an annual well check? The services and screenings done by your health care provider during your annual well check will depend on your age, overall health, lifestyle risk factors, and family history of disease. Counseling  Your health care provider [name] ask you questions about your:  Alcohol use.  Tobacco use.  Drug use.  Emotional well-being.  Home and relationship  well-being.  Sexual activity.  Eating habits.  History of falls.  Memory and ability to understand (cognition).  Work and work Statistician. Screening  You [name] have the following tests or measurements:  Height, weight, and BMI.  Blood pressure.  Lipid and cholesterol levels. These [name] be checked every 5 years, or more frequently if you are over 71 years old.  Skin check.  Lung cancer screening. You [name] have this screening every year starting at age 46 if you have a 30-pack-year history of smoking and currently smoke or have quit within the past 15 years.  Fecal occult blood test (FOBT) of the stool. You [name] have this test every year starting at age 73.  Flexible sigmoidoscopy or colonoscopy. You [name] have a sigmoidoscopy every 5 years or a colonoscopy every 10 years starting at age 49.  Prostate cancer screening. Recommendations will vary depending on your family history and other risks.  Hepatitis C blood test.  Hepatitis B blood test.  Sexually transmitted disease (STD) testing.  Diabetes screening. This is done by checking your blood sugar (glucose) after you have not eaten for a while (fasting). You [name] have this done every 1-3 years.  Abdominal aortic aneurysm (AAA) screening. You [name] need this if you are a current or former smoker.  Osteoporosis. You [name] be screened starting at age 64 if you are at high risk. Talk with your health care provider about your test results, treatment options, and if necessary, the need for more tests. Vaccines  Your health care provider [name] recommend certain vaccines, such as:  Influenza vaccine. This is recommended every year.  Tetanus, diphtheria, and acellular pertussis (Tdap, Td) vaccine. You [name] need  a Td booster every 10 years.  Zoster vaccine. You [name] need this after age 34.  Pneumococcal 13-valent conjugate (PCV13) vaccine. One dose is recommended after age 27.  Pneumococcal polysaccharide (PPSV23) vaccine. One dose is  recommended after age 56. Talk to your health care provider about which screenings and vaccines you need and how often you need them. This information is not intended to replace advice given to you by your health care provider. Make sure you discuss any questions you have with your health care provider. Document Released: 12/25/2015 Document Revised: 08/17/2016 Document Reviewed: 09/29/2015 Elsevier Interactive Patient Education  2017 [place] Prevention in the Home Falls can cause injuries. They can happen to people of all ages. There are many things you can do to make your home safe and to help prevent falls. What can I do on the outside of my home?  Regularly fix the edges of walkways and driveways and fix any cracks.  Remove anything that might make you trip as you walk through a door, such as a raised step or threshold.  Trim any bushes or trees on the path to your home.  Use bright outdoor lighting.  Clear any walking paths of anything that might make someone trip, such as rocks or tools.  Regularly check to see if handrails are loose or broken. Make sure that both sides of any steps have handrails.  Any raised decks and porches should have guardrails on the edges.  Have any leaves, snow, or ice cleared regularly.  Use sand or salt on walking paths during winter.  Clean up any spills in your garage right away. This includes oil or grease spills. What can I do in the bathroom?  Use night lights.  Install grab bars by the toilet and in the tub and shower. Do not use towel bars as grab bars.  Use non-skid mats or decals in the tub or shower.  If you need to sit down in the shower, use a plastic, non-slip stool.  Keep the floor dry. Clean up any water that spills on the floor as soon as it happens.  Remove soap buildup in the tub or shower regularly.  Attach bath mats securely with double-sided non-slip rug tape.  Do not have throw rugs and other things on  the floor that can make you trip. What can I do in the bedroom?  Use night lights.  Make sure that you have a light by your bed that is easy to reach.  Do not use any sheets or blankets that are too big for your bed. They should not hang down onto the floor.  Have a firm chair that has side arms. You can use this for support while you get dressed.  Do not have throw rugs and other things on the floor that can make you trip. What can I do in the kitchen?  Clean up any spills right away.  Avoid walking on wet floors.  Keep items that you use a lot in easy-to-reach places.  If you need to reach something above you, use a strong step stool that has a grab bar.  Keep electrical cords out of the way.  Do not use floor polish or wax that makes floors slippery. If you must use wax, use non-skid floor wax.  Do not have throw rugs and other things on the floor that can make you trip. What can I do with my stairs?  Do not leave any items on the  stairs.  Make sure that there are handrails on both sides of the stairs and use them. Fix handrails that are broken or loose. Make sure that handrails are as long as the stairways.  Check any carpeting to make sure that it is firmly attached to the stairs. Fix any carpet that is loose or worn.  Avoid having throw rugs at the top or bottom of the stairs. If you do have throw rugs, attach them to the floor with carpet tape.  Make sure that you have a light switch at the top of the stairs and the bottom of the stairs. If you do not have them, ask someone to add them for you. What else can I do to help prevent falls?  Wear shoes that:  Do not have high heels.  Have rubber bottoms.  Are comfortable and fit you well.  Are closed at the toe. Do not wear sandals.  If you use a stepladder:  Make sure that it is fully opened. Do not climb a closed stepladder.  Make sure that both sides of the stepladder are locked into place.  Ask someone to  hold it for you, if possible.  Clearly mark and make sure that you can see:  Any grab bars or handrails.  First and last steps.  Where the edge of each step is.  Use tools that help you move around (mobility aids) if they are needed. These include:  Canes.  Walkers.  Scooters.  Crutches.  Turn on the lights when you go into a dark area. Replace any light bulbs as soon as they burn out.  Set up your furniture so you have a clear path. Avoid moving your furniture around.  If any of your floors are uneven, fix them.  If there are any pets around you, be aware of where they are.  Review your medicines with your doctor. Some medicines can make you feel dizzy. This can increase your chance of falling. Ask your doctor what other things that you can do to help prevent falls. This information is not intended to replace advice given to you by your health care provider. Make sure you discuss any questions you have with your health care provider. Document Released: 09/24/2009 Document Revised: 05/05/2016 Document Reviewed: 01/02/2015 Elsevier Interactive Patient Education  2017 [employer].

## 2020-03-16 NOTE — Progress Notes (Signed)
   This service is provided via telemedicine  No vital signs collected/recorded due to the encounter was a telemedicine visit.   Location of patient (ex: home, work):  Home   Patient consents to a telephone visit:  Yes, see telephone encounter dated 03/16/2020 for annual consent   Location of the provider (ex: office, home):  [facility], Office   Name of any referring provider:  [name], DO  Names of all persons participating in the telemedicine service and their role in the encounter:  S.Chrae B/CMA, Sherrie Mustache, NP, and Patient   Time spent on call:  8 min with medical assistant

## 2020-03-19 ENCOUNTER — Other Ambulatory Visit: Payer: Self-pay

## 2020-03-19 ENCOUNTER — Ambulatory Visit (HOSPITAL_COMMUNITY)
Admission: RE | Admit: 2020-03-19 | Discharge: 2020-03-19 | Disposition: A | Discharge status: Home / Self Care | Payer: PPO | Source: Ambulatory Visit | Attending: Nurse Practitioner | Admitting: Nurse Practitioner

## 2020-03-19 ENCOUNTER — Ambulatory Visit (HOSPITAL_COMMUNITY)
Admission: RE | Admit: 2020-03-19 | Discharge: 2020-03-19 | Disposition: A | Discharge status: Home / Self Care | Payer: PPO | Source: Ambulatory Visit | Attending: Internal Medicine | Admitting: Internal Medicine

## 2020-03-19 DIAGNOSIS — R1312 Dysphagia, oropharyngeal phase: Secondary | ICD-10-CM

## 2020-03-19 DIAGNOSIS — R1310 Dysphagia, unspecified: Secondary | ICD-10-CM | POA: Insufficient documentation

## 2020-03-19 DIAGNOSIS — R05 Cough: Secondary | ICD-10-CM | POA: Diagnosis not present

## 2020-03-19 NOTE — Progress Notes (Signed)
Modified Barium Swallow Progress Note  Patient Details  Name: Eric [name] MRN: [id number] Date of Birth: [birth year]  Today's Date: 03/19/2020  Modified Barium Swallow completed.  Full report located under Chart Review in the Imaging Section.  Brief recommendations include the following:  Clinical Impression  Patient presents with a mild pharyngoesophageal dyspahgia. Oral phase is remarkable for prolonged AP transit and trace lingual residue. Pt's UES was observed to have reduced opening, which resulted in vallecular and mostly pyriform sinus residue, or backflow into the pharynx. The residue or backflow ultimately was either penetrated or aspirated after the swallow as it spilled over the arytenoids. The pt was observed to begin coughing prior to airway invasion, and he continued to cough during and after trace aspiration. The pt trialed multiple compensatory strategies to reduced residue or backflow (chin tuck, effortful swallow, and multiple swallows), with the effortful swallow being the most effective. He was able to reduce a moderate amount of pyriform sinus residue and did not aspirate. When the pt was provided a mixed consistency, he was not observed with any aspiration, however he had a significant coughing fit after. Recommend beginning a soft solid diet (Dys 3) and thin liquids with an effortful swallow. The pt was also encouraged to reduce/eliminate mixed consistencies from his diet until further testing Steenson be completed to reduce subjective symptoms. Given the suspected esophgeal and/or GI issues, recommend considering a GI consult.   Swallow Evaluation Recommendations   Recommended Consults: Consider GI evaluation;Consider esophageal assessment   SLP Diet Recommendations: Dysphagia 3 (Mech soft) solids;Thin liquid   Liquid Administration via: Cup;Straw   Medication Administration: Whole meds with liquid   Supervision: Patient able to self feed   Compensations: Minimize  environmental distractions;Slow rate;Small sips/bites   Postural Changes: Remain semi-upright after after feeds/meals (Comment)           Aline August, Student SLP Office: [phone]   03/19/2020,12:34 PM

## 2020-03-21 DIAGNOSIS — J9601 Acute respiratory failure with hypoxia: Secondary | ICD-10-CM | POA: Diagnosis not present

## 2020-03-21 DIAGNOSIS — J189 Pneumonia, unspecified organism: Secondary | ICD-10-CM | POA: Diagnosis not present

## 2020-03-23 ENCOUNTER — Encounter: Payer: Self-pay | Admitting: Nurse Practitioner

## 2020-03-23 ENCOUNTER — Telehealth: Payer: Self-pay

## 2020-03-23 DIAGNOSIS — R062 Wheezing: Secondary | ICD-10-CM | POA: Diagnosis not present

## 2020-03-23 DIAGNOSIS — I5032 Chronic diastolic (congestive) heart failure: Secondary | ICD-10-CM | POA: Diagnosis not present

## 2020-03-23 DIAGNOSIS — R1314 Dysphagia, pharyngoesophageal phase: Secondary | ICD-10-CM | POA: Insufficient documentation

## 2020-03-23 NOTE — Telephone Encounter (Signed)
[place] with [employer] states she follows up with patients and after following up with the patient, she noticed he didn't seem too clear with any suggestions or recommendations after procedure. Faythe Dingwall would appreciate [place] informing the patient of whats next. Routing to Dr. Mariea Clonts.

## 2020-03-25 NOTE — Telephone Encounter (Signed)
Spoke with Rodena Piety, pt's daughter has already set up GI appt, also they have recommendation written down with a list of foods not to eat. They have a good understanding of modifications that need to be made

## 2020-03-27 NOTE — Telephone Encounter (Signed)
Noted  

## 2020-03-30 ENCOUNTER — Telehealth: Payer: Self-pay

## 2020-03-30 NOTE — Telephone Encounter (Signed)
Faythe Dingwall from [employer] called and states that patient had a swallow test done and patient wants to know the results. Please Advise.

## 2020-03-31 NOTE — Telephone Encounter (Signed)
Patient Notified and agreed. 

## 2020-04-02 ENCOUNTER — Other Ambulatory Visit: Payer: Self-pay | Admitting: Internal Medicine

## 2020-04-19 ENCOUNTER — Other Ambulatory Visit: Payer: Self-pay | Admitting: Internal Medicine

## 2020-04-19 DIAGNOSIS — E039 Hypothyroidism, unspecified: Secondary | ICD-10-CM

## 2020-04-20 NOTE — Telephone Encounter (Signed)
rx sent to pharmacy by e-script  

## 2020-04-22 DIAGNOSIS — I5032 Chronic diastolic (congestive) heart failure: Secondary | ICD-10-CM | POA: Diagnosis not present

## 2020-04-22 DIAGNOSIS — R062 Wheezing: Secondary | ICD-10-CM | POA: Diagnosis not present

## 2020-05-07 ENCOUNTER — Other Ambulatory Visit: Payer: Self-pay | Admitting: Gastroenterology

## 2020-05-07 DIAGNOSIS — R1310 Dysphagia, unspecified: Secondary | ICD-10-CM

## 2020-05-07 DIAGNOSIS — R159 Full incontinence of feces: Secondary | ICD-10-CM | POA: Diagnosis not present

## 2020-05-15 ENCOUNTER — Encounter: Payer: Self-pay | Admitting: Pulmonary Disease

## 2020-05-15 ENCOUNTER — Other Ambulatory Visit: Payer: Self-pay

## 2020-05-15 ENCOUNTER — Ambulatory Visit: Payer: PPO | Admitting: Pulmonary Disease

## 2020-05-15 VITALS — BP 114/74 | HR 78 | Temp 97.3°F | Ht 66.0 in | Wt 201.4 lb

## 2020-05-15 DIAGNOSIS — J449 Chronic obstructive pulmonary disease, unspecified: Secondary | ICD-10-CM | POA: Diagnosis not present

## 2020-05-15 DIAGNOSIS — J9611 Chronic respiratory failure with hypoxia: Secondary | ICD-10-CM

## 2020-05-15 MED ORDER — AZITHROMYCIN 250 MG PO TABS: ORAL_TABLET | ORAL | 0 refills | Status: AC

## 2020-05-15 MED ORDER — TRELEGY ELLIPTA 100-62.5-25 MCG/INH IN AEPB: 1.0000 | INHALATION_SPRAY | Freq: Every day | RESPIRATORY_TRACT | 5 refills | Status: DC

## 2020-05-15 MED ORDER — TRELEGY ELLIPTA 100-62.5-25 MCG/INH IN AEPB: 1.0000 | INHALATION_SPRAY | Freq: Every day | RESPIRATORY_TRACT | 0 refills | Status: DC

## 2020-05-15 NOTE — Patient Instructions (Signed)
Stop using breo  Start using trelegy one puff daily, and rinse mouth after each use  Zithromax 250 mg pill >> 2 pills daily on first day, then 1 pill daily for next 4 days  Will arrange for overnight oxygen test and call you with the results  Follow up in 6 weeks with Dr. Halford Chessman or Nurse Practitioner

## 2020-05-15 NOTE — Progress Notes (Signed)
[facility]  Chief Complaint  Patient presents with  . Follow-up    Chronic cough, dry cough daily, SOB with cough    Constitutional:  BP 114/74 (BP Location: Left Arm, Cuff Size: Normal)   Pulse 78   Temp (!) 97.3 F (36.3 C) (Oral)   Ht 5\' 6"  (1.676 m)   Wt 201 lb 6.4 oz (91.4 kg)   SpO2 93%   BMI 32.51 kg/m   Past Medical History:  Anxiety, OA, Chronic diastolic CHF, Depression, Dysphagia, DVT, Nephrolithiasis, HTN, Ischemic colitis, Back pain, HLD, PAF, PNA 2004, Spinal stenosis  Summary:  Eric [name] is a 86 y.o. 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  Subjective:   He was previously seen by Dr. Alva Garnet.  He is here with his daughter.  He has noticed more trouble feeling short of breath with exertion.  Has more cough, wheeze, and chest congestion.  Has been using breo, and this helps.  Gets short of breath when bending over to tie his shoes.  Has leg swelling.  Not having fever, hemoptysis.   Uses 2 liters oxygen at night.  Feels like his sleep okay otherwise.  He maintained his SpO2 > 90% on room air while walking 3 laps today.   Physical Exam:   Appearance - well kempt  ENMT - no sinus tenderness, no nasal discharge, no oral exudate, Mallampati 4  Respiratory - b/l rhonchi with scattered wheezing that clear with coughing  CV - regular rate and rhythm, no murmurs  GI - soft, non tender  Lymph - no adenopathy noted in neck  Ext - 1+ lower leg edema  Skin - no rashes  Neuro - normal strength, oriented x 3  Psych - normal mood and affect   Assessment/Plan:   COPD with chronic bronchitis. - he has mild exacerbation - will give him course of zithromax - don't think he needs prednisone at this time - will change from breo to trelegy - continue albuterol prn  Chronic respiratory failure with hypoxia. - using 2 liters oxygen at night - will arrange for ONO on 2  liters - depending on results will determine if he might also need sleep study  Leg edema. - continue lasix per PCP and cardiology  A total of 32 minutes addressing patient care on the day of the visit.  Follow up:  Patient Instructions  Stop using breo  Start using trelegy one puff daily, and rinse mouth after each use  Zithromax 250 mg pill >> 2 pills daily on first day, then 1 pill daily for next 4 days  Will arrange for overnight oxygen test and call you with the results  Follow up in 6 weeks with Dr. Halford Chessman or Nurse Practitioner   Signature:  Chesley Mires, MD [place] Pager: [phone] 05/15/2020, 11:34 AM  Flow Sheet     Pulmonary tests:   PFT 12/25/18 >> FEV1 1.10 (45%), FEV1% 64, TLC 3.81 (60%)  Sleep tests:    Cardiac tests:   Echo 10/20/17 >> EF 60 to 65%, mild LVH, grade 2 DD  Medications:   Allergies as of 05/15/2020      Reactions   Morphine And Related Shortness Of Breath   Percocet [oxycodone-acetaminophen] Shortness Of Breath   Valium Shortness Of Breath      Medication List       Accurate as of May 15, 2020 11:34 AM. If you have any questions,  ask your nurse or doctor.        STOP taking these medications   Breo Ellipta 100-25 MCG/INH Aepb Generic drug: fluticasone furoate-vilanterol Stopped by: Chesley Mires, MD     TAKE these medications   albuterol (2.5 MG/3ML) 0.083% nebulizer solution Commonly known as: PROVENTIL Take 3 mLs (2.5 mg total) by nebulization 2 (two) times daily as needed for up to 30 doses for wheezing or shortness of breath.   apixaban 5 MG Tabs tablet Commonly known as: Eliquis Take 1 tablet (5 mg total) by mouth 2 (two) times daily.   azithromycin 250 MG tablet Commonly known as: ZITHROMAX Take 2 tablets (500 mg total) by mouth daily for 1 day, THEN 1 tablet (250 mg total) daily for 4 days. Start taking on: May 15, 2020 Started by: Chesley Mires, MD   cetirizine 10 MG tablet Commonly  known as: ZYRTEC TAKE 1 TABLET BY MOUTH EVERY DAY   fluticasone 50 MCG/ACT nasal spray Commonly known as: FLONASE SHAKE LIQUID AND USE 2 SPRAYS IN EACH NOSTRIL DAILY   furosemide 40 MG tablet Commonly known as: LASIX Take 1 tablet (40 mg total) by mouth daily.   gabapentin 300 MG capsule Commonly known as: NEURONTIN Take 1 capsule (300 mg total) by mouth 2 (two) times daily.   levothyroxine 25 MCG tablet Commonly known as: SYNTHROID TAKE 1 TABLET(25 MCG) BY MOUTH DAILY BEFORE BREAKFAST What changed: Another medication with the same name was removed. Continue taking this medication, and follow the directions you see here. Changed by: Chesley Mires, MD   metoprolol tartrate 25 MG tablet Commonly known as: LOPRESSOR TAKE 1 TABLET(25 MG) BY MOUTH TWICE DAILY   OXYGEN Inhale 2 L into the lungs at bedtime.   pantoprazole 40 MG tablet Commonly known as: PROTONIX TAKE 1 TABLET(40 MG) BY MOUTH AT BEDTIME   potassium chloride 10 MEQ tablet Commonly known as: KLOR-CON Take 1 tablet (10 mEq total) by mouth daily.   pravastatin 40 MG tablet Commonly known as: PRAVACHOL Take 1 tablet (40 mg total) by mouth daily.   Trelegy Ellipta 100-62.5-25 MCG/INH Aepb Generic drug: Fluticasone-Umeclidin-Vilant Inhale 1 puff into the lungs daily. Started by: Chesley Mires, MD   Vitamin D 1000 units capsule Take one tablet once daily       Past Surgical History:  He  has a past surgical history that includes history of abd ultrasound (11/01); Cervical fusion (02/10/2011); Cataract extraction w/ intraocular lens  implant, bilateral; Multiple tooth extractions; ORIF fibula fracture (Left, 01/06/2017); CARDIOVERSION (N/A, 06/20/2017); Colonoscopy with propofol (N/A, 02/20/2018); Cholecystectomy (05/24/2018); and Cholecystectomy (N/A, 05/24/2018).  Family History:  His family history includes Atrial fibrillation in his brother; Cancer in an other family member; Heart disease in his brother; Other in his  mother; Stroke in his father and paternal grandfather.  Social History:  He  reports that he has quit smoking. He quit after 1.00 year of use. He has never used smokeless tobacco. He reports that he does not drink alcohol or use drugs.

## 2020-05-15 NOTE — Addendum Note (Signed)
Addended byCoralie Keens on: 05/15/2020 12:09 PM   Modules accepted: Orders

## 2020-05-22 ENCOUNTER — Other Ambulatory Visit: Payer: Self-pay | Admitting: Internal Medicine

## 2020-05-22 ENCOUNTER — Encounter: Payer: Self-pay | Admitting: Pulmonary Disease

## 2020-05-22 DIAGNOSIS — J449 Chronic obstructive pulmonary disease, unspecified: Secondary | ICD-10-CM | POA: Diagnosis not present

## 2020-05-22 DIAGNOSIS — R0902 Hypoxemia: Secondary | ICD-10-CM | POA: Diagnosis not present

## 2020-05-22 NOTE — Telephone Encounter (Signed)
rx sent to pharmacy by e-script  

## 2020-05-23 DIAGNOSIS — I5032 Chronic diastolic (congestive) heart failure: Secondary | ICD-10-CM | POA: Diagnosis not present

## 2020-05-23 DIAGNOSIS — R062 Wheezing: Secondary | ICD-10-CM | POA: Diagnosis not present

## 2020-05-27 ENCOUNTER — Other Ambulatory Visit: Payer: Self-pay | Admitting: Internal Medicine

## 2020-05-27 ENCOUNTER — Other Ambulatory Visit: Payer: Self-pay | Admitting: Cardiovascular Disease

## 2020-05-27 ENCOUNTER — Ambulatory Visit
Admission: RE | Admit: 2020-05-27 | Discharge: 2020-05-27 | Disposition: A | Discharge status: Home / Self Care | Payer: PPO | Source: Ambulatory Visit | Attending: Gastroenterology | Admitting: Gastroenterology

## 2020-05-27 DIAGNOSIS — K449 Diaphragmatic hernia without obstruction or gangrene: Secondary | ICD-10-CM | POA: Diagnosis not present

## 2020-05-27 DIAGNOSIS — R7989 Other specified abnormal findings of blood chemistry: Secondary | ICD-10-CM

## 2020-05-27 DIAGNOSIS — E782 Mixed hyperlipidemia: Secondary | ICD-10-CM

## 2020-05-27 DIAGNOSIS — R1310 Dysphagia, unspecified: Secondary | ICD-10-CM

## 2020-06-11 ENCOUNTER — Other Ambulatory Visit: Payer: Self-pay | Admitting: Cardiovascular Disease

## 2020-06-16 ENCOUNTER — Telehealth: Payer: Self-pay | Admitting: Pulmonary Disease

## 2020-06-16 NOTE — Telephone Encounter (Signed)
ONO with 2 liters 05/22/20 >> test time 8 hrs 32 min.  Baseline SpO2 91%, SpO2 low 64%.  Spent 57 min 8 sec with SpO2 < 88%.  SpO2 graph suggestive of REM related desaturation pattern that could be seen in sleep apnea.  He has appointment on 06/30/20.  Will discuss with him then about possible need for in lab sleep study.

## 2020-06-22 DIAGNOSIS — R062 Wheezing: Secondary | ICD-10-CM | POA: Diagnosis not present

## 2020-06-22 DIAGNOSIS — I5032 Chronic diastolic (congestive) heart failure: Secondary | ICD-10-CM | POA: Diagnosis not present

## 2020-06-24 ENCOUNTER — Other Ambulatory Visit: Payer: Self-pay | Admitting: Internal Medicine

## 2020-06-24 NOTE — Telephone Encounter (Signed)
rx sent to pharmacy by e-script  

## 2020-06-25 ENCOUNTER — Other Ambulatory Visit: Payer: Self-pay

## 2020-06-25 MED ORDER — APIXABAN 5 MG PO TABS: ORAL_TABLET | ORAL | 3 refills | Status: DC

## 2020-06-25 NOTE — Telephone Encounter (Signed)
Patient dropped off paperwork for Patient Assistance for Eliquis.  Printed rx and paperwork placed in Dr. Cyndi Lennert folder to review and sign.

## 2020-06-30 ENCOUNTER — Other Ambulatory Visit: Payer: Self-pay

## 2020-06-30 ENCOUNTER — Encounter: Payer: Self-pay | Admitting: Pulmonary Disease

## 2020-06-30 ENCOUNTER — Ambulatory Visit: Payer: PPO | Admitting: Pulmonary Disease

## 2020-06-30 VITALS — BP 110/66 | HR 64 | Temp 97.8°F | Ht 66.0 in | Wt 200.6 lb

## 2020-06-30 DIAGNOSIS — J449 Chronic obstructive pulmonary disease, unspecified: Secondary | ICD-10-CM

## 2020-06-30 DIAGNOSIS — J9611 Chronic respiratory failure with hypoxia: Secondary | ICD-10-CM | POA: Diagnosis not present

## 2020-06-30 MED ORDER — GUAIFENESIN ER 600 MG PO TB12: 1200.0000 mg | ORAL_TABLET | Freq: Two times a day (BID) | ORAL | Status: DC | PRN

## 2020-06-30 NOTE — Progress Notes (Signed)
[facility]  Chief Complaint  Patient presents with  . Follow-up    Breathing progressively worse since the last visit. He has times where he coughs alot- non prod. He has been using his neb about 4 x per wk.     Constitutional:  BP 110/66 (BP Location: Left Arm, Cuff Size: Normal)   Pulse 64   Temp 97.8 F (36.6 C) (Oral)   Ht 5\' 6"  (1.676 m)   Wt 200 lb 9.6 oz (91 kg)   SpO2 96% Comment: on RA  BMI 32.38 kg/m   Past Medical History:  Anxiety, OA, Chronic diastolic CHF, Depression, Dysphagia, DVT, Nephrolithiasis, HTN, Ischemic colitis, Back pain, HLD, PAF, PNA 2004, Spinal stenosis  Summary:  Eric [name] is a 86 y.o. 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  Subjective:   He had ONO with 2 liters.  Has low oxygen and maybe REM related desaturation pattern.  trelegy has helped.  Still has cough and chest congestion.  Hard to bring up phlegm sometimes.  Not having fever, hemoptysis, wheeze.  Ankle swelling stable.  Physical Exam:   Appearance - well kempt   ENMT - no sinus tenderness, no oral exudate, no LAN, Mallampati 4 airway, no stridor  Respiratory - equal breath sounds bilaterally, no wheezing or rales  CV - s1s2 regular rate and rhythm, no murmurs  Ext - 1+ edema  Skin - no rashes  Psych - normal mood and affect    Assessment/Plan:   COPD with chronic bronchitis. - continue trelegy - prn albuterol - prn mucinex  Chronic respiratory failure with hypoxia. - change to 4 liters oxygen at night - if he is still having issues with sleep or daytime sleepiness, then he will call to set up in lab sleep study  Leg edema. - continue lasix per PCP and cardiology  A total of  31 minutes spent addressing patient care issues on day of visit.   Follow up:  Patient Instructions  Change oxygen to 4 liters at night  Call if you want to get sleep study set up  Try using  mucinex as needed to help loosen phlegm  Follow up in 4 months   Signature:  Chesley Mires, MD [place] Pager: [phone] 06/30/2020, 11:15 AM  Flow Sheet     Pulmonary tests:   PFT 12/25/18 >> FEV1 1.10 (45%), FEV1% 64, TLC 3.81 (60%)  Sleep tests:   ONO with 2 liters 05/22/20 >> test time 8 hrs 32 min.  Baseline SpO2 91%, SpO2 low 64%.  Spent 57 min 8 sec with SpO2 < 88%.  SpO2 graph suggestive of REM related desaturation pattern that could be seen in sleep apnea.  Cardiac tests:   Echo 10/20/17 >> EF 60 to 65%, mild LVH, grade 2 DD  Medications:   Allergies as of 06/30/2020      Reactions   Morphine And Related Shortness Of Breath   Percocet [oxycodone-acetaminophen] Shortness Of Breath   Valium Shortness Of Breath      Medication List       Accurate as of June 30, 2020 11:15 AM. If you have any questions, ask your nurse or doctor.        albuterol (2.5 MG/3ML) 0.083% nebulizer solution Commonly known as: PROVENTIL Take 3 mLs (2.5 mg total) by nebulization 2 (two) times daily as needed for up to 30 doses for wheezing or shortness of breath.  apixaban 5 MG Tabs tablet Commonly known as: Eliquis Take one tablet by mouth twice daily   cetirizine 10 MG tablet Commonly known as: ZYRTEC TAKE 1 TABLET BY MOUTH EVERY DAY   fluticasone 50 MCG/ACT nasal spray Commonly known as: FLONASE SHAKE LIQUID AND USE 2 SPRAYS IN EACH NOSTRIL DAILY   furosemide 40 MG tablet Commonly known as: LASIX Take 1 tablet (40 mg total) by mouth daily.   gabapentin 300 MG capsule Commonly known as: NEURONTIN Take 1 capsule (300 mg total) by mouth 2 (two) times daily.   guaiFENesin 600 MG 12 hr tablet Commonly known as: Mucinex Take 2 tablets (1,200 mg total) by mouth 2 (two) times daily as needed for cough or to loosen phlegm. Started by: Chesley Mires, MD   levothyroxine 25 MCG tablet Commonly known as: SYNTHROID TAKE 1 TABLET(25 MCG) BY MOUTH DAILY  BEFORE BREAKFAST   metoprolol tartrate 25 MG tablet Commonly known as: LOPRESSOR TAKE 1 TABLET(25 MG) BY MOUTH TWICE DAILY   OXYGEN Inhale 2 L into the lungs at bedtime.   pantoprazole 40 MG tablet Commonly known as: PROTONIX TAKE 1 TABLET(40 MG) BY MOUTH AT BEDTIME   potassium chloride 10 MEQ tablet Commonly known as: KLOR-CON Take 1 tablet (10 mEq total) by mouth daily.   pravastatin 40 MG tablet Commonly known as: PRAVACHOL TAKE 1 TABLET(40 MG) BY MOUTH DAILY   Trelegy Ellipta 100-62.5-25 MCG/INH Aepb Generic drug: Fluticasone-Umeclidin-Vilant Inhale 1 puff into the lungs daily. What changed: Another medication with the same name was removed. Continue taking this medication, and follow the directions you see here. Changed by: Chesley Mires, MD   Vitamin D 1000 units capsule Take one tablet once daily       Past Surgical History:  He  has a past surgical history that includes history of abd ultrasound (11/01); Cervical fusion (02/10/2011); Cataract extraction w/ intraocular lens  implant, bilateral; Multiple tooth extractions; ORIF fibula fracture (Left, 01/06/2017); CARDIOVERSION (N/A, 06/20/2017); Colonoscopy with propofol (N/A, 02/20/2018); Cholecystectomy (05/24/2018); and Cholecystectomy (N/A, 05/24/2018).  Family History:  His family history includes Atrial fibrillation in his brother; Cancer in an other family member; Heart disease in his brother; Other in his mother; Stroke in his father and paternal grandfather.  Social History:  He  reports that he has quit smoking. He quit after 1.00 year of use. He has never used smokeless tobacco. He reports that he does not drink alcohol and does not use drugs.

## 2020-06-30 NOTE — Patient Instructions (Signed)
Change oxygen to 4 liters at night  Call if you want to get sleep study set up  Try using mucinex as needed to help loosen phlegm  Follow up in 4 months

## 2020-07-10 ENCOUNTER — Encounter: Payer: Self-pay | Admitting: Nurse Practitioner

## 2020-07-10 ENCOUNTER — Other Ambulatory Visit: Payer: Self-pay

## 2020-07-10 ENCOUNTER — Ambulatory Visit (INDEPENDENT_AMBULATORY_CARE_PROVIDER_SITE_OTHER): Payer: PPO | Admitting: Nurse Practitioner

## 2020-07-10 VITALS — BP 128/76 | HR 64 | Temp 96.9°F | Ht 66.0 in | Wt 199.0 lb

## 2020-07-10 DIAGNOSIS — E782 Mixed hyperlipidemia: Secondary | ICD-10-CM | POA: Diagnosis not present

## 2020-07-10 DIAGNOSIS — M48062 Spinal stenosis, lumbar region with neurogenic claudication: Secondary | ICD-10-CM | POA: Diagnosis not present

## 2020-07-10 DIAGNOSIS — I10 Essential (primary) hypertension: Secondary | ICD-10-CM

## 2020-07-10 DIAGNOSIS — R05 Cough: Secondary | ICD-10-CM

## 2020-07-10 DIAGNOSIS — I480 Paroxysmal atrial fibrillation: Secondary | ICD-10-CM | POA: Diagnosis not present

## 2020-07-10 DIAGNOSIS — R600 Localized edema: Secondary | ICD-10-CM

## 2020-07-10 DIAGNOSIS — N1832 Chronic kidney disease, stage 3b: Secondary | ICD-10-CM | POA: Diagnosis not present

## 2020-07-10 DIAGNOSIS — I5032 Chronic diastolic (congestive) heart failure: Secondary | ICD-10-CM | POA: Diagnosis not present

## 2020-07-10 DIAGNOSIS — E039 Hypothyroidism, unspecified: Secondary | ICD-10-CM

## 2020-07-10 DIAGNOSIS — R053 Chronic cough: Secondary | ICD-10-CM

## 2020-07-10 MED ORDER — METOPROLOL TARTRATE 25 MG PO TABS: ORAL_TABLET | ORAL | 1 refills | Status: DC

## 2020-07-10 NOTE — Progress Notes (Signed)
Careteam: Patient Care Team: [name], DO as PCP - General (Geriatric Medicine) [name], MD as PCP - Cardiology (Cardiology) [name], MD as Consulting Physician (Cardiology) Normajean Glasgow, MD as Attending Physician (Physical Medicine and Rehabilitation) Phylliss Bob, MD as Consulting Physician (Orthopedic Surgery) Wilhelmina Mcardle, MD (Inactive) as Consulting Physician (Pulmonary Disease)  PLACE OF SERVICE:  [place]  Advanced Directive information    Allergies  Allergen Reactions  . Morphine And Related Shortness Of Breath  . Percocet [Oxycodone-Acetaminophen] Shortness Of Breath  . Valium Shortness Of Breath    Chief Complaint  Patient presents with  . Medical Management of Chronic Issues    4 month follow-up. Here with daughter Jenny Reichmann   . Shortness of Breath    Ongoing shortness of breath. Patient appeared SOB from walking to scale and exam room. Patient seen lung specialist last week.   . Medication Management    Refill metoprolol      HPI: Patient is a 86 y.o. male for routine follow up  Ongoing shortness of breath/chronic cough- due to COPD, following with pulmonary, saw last week and was recommended to increase O2 to 4L.   CHF- stable. Ongoing LE edema. Had to change his style of shoe, recommended something to slide on.   Hypothyroid- TSH at 8 on last follow up, currently on synthroid 25 mcg.  Spinal stenosis- no longer having back pain on gabapentin   Hyperlipidemia- continues on statin  Hypertension -  Continues on metoprolol   A fib- continues on eliquis for anticoagulation and metoprolol for rate control  GERD-controlled on protonix   Hyperlipidemia- continues on Pravachol. LDL at goal.     Review of Systems:  Review of Systems  Constitutional: Negative for chills, fever and weight loss.  HENT: Negative for tinnitus.   Respiratory: Positive for cough and shortness of breath. Negative for sputum production.     Cardiovascular: Positive for leg swelling (off and on, stable.). Negative for chest pain and palpitations.  Gastrointestinal: Negative for abdominal pain, constipation, diarrhea and heartburn.  Genitourinary: Negative for dysuria, frequency and urgency.  Musculoskeletal: Negative for back pain, falls, joint pain and myalgias.  Skin: Negative.   Neurological: Negative for dizziness and headaches.  Psychiatric/Behavioral: Negative for depression and memory loss. The patient does not have insomnia.     Past Medical History:  Diagnosis Date  . Anxiety   . Arthritis   . Atypical chest pain    a. 05/2017 MV: no ischemia, EF 79%.  . Chest pain 10/20/2017  . Chronic diastolic CHF (congestive heart failure) ([place])    a. 01/2011 Echo: EF 50-55%, gr1 DD, mild AI, nl RV fxn, mild TR/PR; b. 10/2017 Echo: EF 60-65%, mild LVH, gr2 DD.  Marland Kitchen DDD (degenerative disc disease), cervical   . Depressive disorder, not elsewhere classified   . Difficult intubation   . Dysphagia, oral phase   . Dyspnea   . Edema   . Gallstones    a. Symptomatic - s/p lap chole 05/2018.  Marland Kitchen History of DVT (deep vein thrombosis)   . History of kidney stones   . Hypertension   . Hypoxemia   . Impacted cerumen   . Ischemic colitis ([place])    a. 02/2018 GIB - colonoscopy w/ isch colitis. Anticoagulation resumed.  . Long term current use of anticoagulant 03/02/2011  . LOW BACK PAIN SYNDROME 03/17/2009   Qualifier: Diagnosis of  By: Council Mechanic MD, Hilaria Ota   . Mixed hyperlipidemia   .  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 Other myelopathy   . Pain in limb   . Palpitations   . Paroxysmal Atrial Fibrillation ([place])    a. a. 01/2011 in setting of post-op complications including aspiration pna;  b. CHA2DS2VASc = 4--> Amio/Eliquis.  . Pneumonia 03/06/2003  . Spinal stenosis, unspecified region other than cervical   . 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 Syncope     a. 10/2017-->Event monitor: RSR, rare PACs/PVCs.  . Thoracic or lumbosacral neuritis or radiculitis, unspecified    Past Surgical History:  Procedure Laterality Date  . CARDIOVERSION N/A 06/20/2017   Procedure: CARDIOVERSION;  Surgeon: [name], MD;  Location: [facility];  Service: Cardiovascular;  Laterality: N/A;  . CATARACT EXTRACTION W/ INTRAOCULAR LENS  IMPLANT, BILATERAL    . CERVICAL FUSION  02/10/2011  . CHOLECYSTECTOMY  05/24/2018  . CHOLECYSTECTOMY N/A 05/24/2018   Procedure: LAPAROSCOPIC CHOLECYSTECTOMY;  Surgeon: Coralie Keens, MD;  Location: [place];  Service: General;  Laterality: N/A;  . COLONOSCOPY WITH PROPOFOL N/A 02/20/2018   Procedure: COLONOSCOPY WITH PROPOFOL;  Surgeon: Lucilla Lame, MD;  Location: [facility];  Service: Endoscopy;  Laterality: N/A;  . history of abd ultrasound  11/01   fatty liver  . MULTIPLE TOOTH EXTRACTIONS    . ORIF FIBULA FRACTURE Left 01/06/2017   Procedure: OPEN REDUCTION INTERNAL FIXATION (ORIF) FIBULA FRACTURE DISTAL FIBULA;  Surgeon: Melrose Nakayama, MD;  Location: [facility];  Service: Orthopedics;  Laterality: Left;  Patient states has problems if he will have a tube in throat for Genera; Anesthesia   Social History:   reports that he has quit smoking. He quit after 1.00 year of use. He has never used smokeless tobacco. He reports that he does not drink alcohol and does not use drugs.  Family History  Problem Relation Age of Onset  . Stroke Father   . Atrial fibrillation Brother        on coumadin  . Heart disease Brother        AFib- coumadin  . Stroke Paternal Grandfather   . Other Mother        hemorrhage  . Cancer Other        colon cancer at early age  . Prostate cancer Neg Hx   . Kidney cancer Neg Hx   . Bladder Cancer Neg Hx     Medications: Patient's Medications  New Prescriptions   No medications on file  Previous Medications   ALBUTEROL (PROVENTIL) (2.5 MG/3ML) 0.083% NEBULIZER SOLUTION    Take 3 mLs (2.5 mg  total) by nebulization 2 (two) times daily as needed for up to 30 doses for wheezing or shortness of breath.   APIXABAN (ELIQUIS) 5 MG TABS TABLET    Take one tablet by mouth twice daily   CETIRIZINE (ZYRTEC) 10 MG TABLET    TAKE 1 TABLET BY MOUTH EVERY DAY   CHOLECALCIFEROL (VITAMIN D) 1000 UNITS CAPSULE    Take one tablet once daily   FLUTICASONE (FLONASE) 50 MCG/ACT NASAL SPRAY    SHAKE LIQUID AND USE 2 SPRAYS IN EACH NOSTRIL DAILY   FLUTICASONE-UMECLIDIN-VILANT (TRELEGY ELLIPTA) 100-62.5-25 MCG/INH AEPB    Inhale 1 puff into the lungs daily.   FUROSEMIDE (LASIX) 40 MG TABLET    Take 1 tablet (40 mg total) by mouth daily.   GABAPENTIN (NEURONTIN) 300 MG CAPSULE    Take 1 capsule (300  mg total) by mouth 2 (two) times daily.   GUAIFENESIN (MUCINEX) 600 MG 12 HR TABLET    Take 2 tablets (1,200 mg total) by mouth 2 (two) times daily as needed for cough or to loosen phlegm.   LEVOTHYROXINE (SYNTHROID) 25 MCG TABLET    TAKE 1 TABLET(25 MCG) BY MOUTH DAILY BEFORE BREAKFAST   METOPROLOL TARTRATE (LOPRESSOR) 25 MG TABLET    TAKE 1 TABLET(25 MG) BY MOUTH TWICE DAILY   OXYGEN    Inhale 4 L into the lungs at bedtime.    PANTOPRAZOLE (PROTONIX) 40 MG TABLET    TAKE 1 TABLET(40 MG) BY MOUTH AT BEDTIME   POTASSIUM CHLORIDE SA (K-DUR,KLOR-CON) 10 MEQ TABLET    Take 1 tablet (10 mEq total) by mouth daily.   PRAVASTATIN (PRAVACHOL) 40 MG TABLET    TAKE 1 TABLET(40 MG) BY MOUTH DAILY  Modified Medications   No medications on file  Discontinued Medications   No medications on file    Physical Exam:  Vitals:   07/10/20 1040  BP: 128/76  Pulse: 64  Temp: (!) 96.9 F (36.1 C)  TempSrc: Temporal  SpO2: 94%  Weight: 199 lb (90.3 kg)  Height: 5\' 6"  (1.676 m)   Body mass index is 32.12 kg/m. Wt Readings from Last 3 Encounters:  07/10/20 199 lb (90.3 kg)  06/30/20 200 lb 9.6 oz (91 kg)  05/15/20 201 lb 6.4 oz (91.4 kg)    Physical Exam Labs reviewed: Basic Metabolic Panel: Recent Labs     10/11/19 1215 10/11/19 1215 12/24/19 1226 01/27/20 1019 03/11/20 1143  NA 144  --   --   --  140  K 4.6  --   --   --  4.7  CL 105  --   --   --  103  CO2 32  --   --   --  30  GLUCOSE 84  --   --   --  78  BUN 29*  --   --   --  24  CREATININE 1.39*  --   --   --  1.11  CALCIUM 9.6  --   --   --  9.5  TSH 21.51*   < > 15.300* 16.42* 8.83*   < > = values in this interval not displayed.   Liver Function Tests: Recent Labs    10/11/19 1215 03/11/20 1143  AST 59* 65*  ALT 49* 47*  BILITOT 1.0 0.9  PROT 7.1 7.3   No results for input(s): LIPASE, AMYLASE in the last 8760 hours. No results for input(s): AMMONIA in the last 8760 hours. CBC: Recent Labs    10/11/19 1215 03/11/20 1143  WBC 6.6 6.4  NEUTROABS 2,990 2,854  HGB 14.2 15.2  HCT 42.4 47.3  MCV 95.9 98.5  PLT 192 197   Lipid Panel: Recent Labs    10/11/19 1215 03/11/20 1143  CHOL 136 128  HDL 34* 31*  LDLCALC 69 67  TRIG 237* 242*  CHOLHDL 4.0 4.1   TSH: Recent Labs    12/24/19 1226 01/27/20 1019 03/11/20 1143  TSH 15.300* 16.42* 8.83*   A1C: Lab Results  Component Value Date   HGBA1C 5.7 (H) 05/16/2016     Assessment/Plan 1. Hypothyroidism, unspecified type Will follow up TSH, continues on synthroid 25 mcg - TSH  2. Chronic diastolic CHF (congestive heart failure) (HCC) -stable at this time. Continues on metoprolol with lasix; following with cardiology yearly  3. Chronic cough Ongoing, encouraged to use  mucinex per pulmonary.  5. Stage 3b chronic kidney disease -Encourage proper hydration and to avoid NSAIDS (Aleve, Advil, Motrin, Ibuprofen)  - COMPLETE METABOLIC PANEL WITH GFR  6. Mixed hyperlipidemia Continues on pravastatin, encouraged dietary compliance as well. - COMPLETE METABOLIC PANEL WITH GFR - Lipid Panel  7. Spinal stenosis, lumbar region, with neurogenic claudication Stable, continues on gabapentin, without pain at this time.  8. Paroxysmal atrial fibrillation  (HCC) Rate controlled on metoprolol. Continues on eliquis for anticoagulation  - metoprolol tartrate (LOPRESSOR) 25 MG tablet; TAKE 1 TABLET(25 MG) BY MOUTH TWICE DAILY  Dispense: 180 tablet; Refill: 1  9. Lower extremity edema Comes and goes, stable at this time. continues on lasix   10. Essential hypertension -well controlled on metorpolol and lasix - CBC with Differential/Platelet - COMPLETE METABOLIC PANEL WITH GFR  Next appt: 4 months. Eric [name]. [place], [facility] [phone]

## 2020-07-11 LAB — COMPLETE METABOLIC PANEL WITH GFR
AG Ratio: 1.6 (calc) (ref 1.0–2.5)
ALT: 40 U/L (ref 9–46)
AST: 43 U/L — ABNORMAL HIGH (ref 10–35)
Albumin: 4.3 g/dL (ref 3.6–5.1)
Alkaline phosphatase (APISO): 55 U/L (ref 35–144)
BUN/Creatinine Ratio: 14 (calc) (ref 6–22)
BUN: 21 mg/dL (ref 7–25)
CO2: 29 mmol/L (ref 20–32)
Calcium: 9.2 mg/dL (ref 8.6–10.3)
Chloride: 103 mmol/L (ref 98–110)
Creat: 1.45 mg/dL — ABNORMAL HIGH (ref 0.70–1.11)
GFR, Est African American: 50 mL/min/{1.73_m2} — ABNORMAL LOW (ref >=60)
GFR, Est Non African American: 43 mL/min/{1.73_m2} — ABNORMAL LOW (ref >=60)
Globulin: 2.7 g/dL (calc) (ref 1.9–3.7)
Glucose, Bld: 81 mg/dL (ref 65–99)
Potassium: 4.7 mmol/L (ref 3.5–5.3)
Sodium: 140 mmol/L (ref 135–146)
Total Bilirubin: 0.8 mg/dL (ref 0.2–1.2)
Total Protein: 7 g/dL (ref 6.1–8.1)

## 2020-07-11 LAB — CBC WITH DIFFERENTIAL/PLATELET
Absolute Monocytes: 540 cells/uL (ref 200–950)
Basophils Absolute: 72 cells/uL (ref 0–200)
Basophils Relative: 1.2 %
Eosinophils Absolute: 132 cells/uL (ref 15–500)
Eosinophils Relative: 2.2 %
HCT: 43.5 % (ref 38.5–50.0)
Hemoglobin: 14.5 g/dL (ref 13.2–17.1)
Lymphs Abs: 2514 cells/uL (ref 850–3900)
MCH: 31.5 pg (ref 27.0–33.0)
MCHC: 33.3 g/dL (ref 32.0–36.0)
MCV: 94.6 fL (ref 80.0–100.0)
MPV: 10.4 fL (ref 7.5–12.5)
Monocytes Relative: 9 %
Neutro Abs: 2742 cells/uL (ref 1500–7800)
Neutrophils Relative %: 45.7 %
Platelets: 200 10*3/uL (ref 140–400)
RBC: 4.6 10*6/uL (ref 4.20–5.80)
RDW: 12.5 % (ref 11.0–15.0)
Total Lymphocyte: 41.9 %
WBC: 6 10*3/uL (ref 3.8–10.8)

## 2020-07-11 LAB — LIPID PANEL
Cholesterol: 108 mg/dL (ref <200)
HDL: 29 mg/dL — ABNORMAL LOW (ref >=40)
LDL Cholesterol (Calc): 51 mg/dL (calc)
Non-HDL Cholesterol (Calc): 79 mg/dL (calc) (ref <130)
Total CHOL/HDL Ratio: 3.7 (calc) (ref <5.0)
Triglycerides: 227 mg/dL — ABNORMAL HIGH (ref <150)

## 2020-07-11 LAB — TSH: TSH: 3.36 mIU/L (ref 0.40–4.50)

## 2020-07-23 DIAGNOSIS — I5032 Chronic diastolic (congestive) heart failure: Secondary | ICD-10-CM | POA: Diagnosis not present

## 2020-07-23 DIAGNOSIS — R062 Wheezing: Secondary | ICD-10-CM | POA: Diagnosis not present

## 2020-08-23 ENCOUNTER — Other Ambulatory Visit: Payer: Self-pay | Admitting: Internal Medicine

## 2020-08-23 DIAGNOSIS — I5032 Chronic diastolic (congestive) heart failure: Secondary | ICD-10-CM | POA: Diagnosis not present

## 2020-08-23 DIAGNOSIS — R062 Wheezing: Secondary | ICD-10-CM | POA: Diagnosis not present

## 2020-08-24 ENCOUNTER — Other Ambulatory Visit: Payer: Self-pay

## 2020-08-24 DIAGNOSIS — E039 Hypothyroidism, unspecified: Secondary | ICD-10-CM

## 2020-08-24 MED ORDER — LEVOTHYROXINE SODIUM 25 MCG PO TABS: ORAL_TABLET | ORAL | 0 refills | Status: DC

## 2020-08-24 NOTE — Telephone Encounter (Signed)
This encounter was created in error - please disregard.

## 2020-08-26 ENCOUNTER — Telehealth: Payer: Self-pay | Admitting: Pulmonary Disease

## 2020-08-26 MED ORDER — TRELEGY ELLIPTA 100-62.5-25 MCG/INH IN AEPB: 1.0000 | INHALATION_SPRAY | Freq: Every day | RESPIRATORY_TRACT | 0 refills | Status: DC

## 2020-08-26 NOTE — Telephone Encounter (Signed)
Called and spoke with patient to let him know that I would leave some samples up front for him to pick up. He states he is currently in the donut hole and its costing him $130 a month. I told him that we don't have very many so I can only leave him 2 for right now and if he ends up needing more to let us know. Samples placed up front. Nothing further needed at this time.

## 2020-08-30 ENCOUNTER — Other Ambulatory Visit: Payer: Self-pay | Admitting: Internal Medicine

## 2020-08-30 DIAGNOSIS — I5033 Acute on chronic diastolic (congestive) heart failure: Secondary | ICD-10-CM

## 2020-09-22 DIAGNOSIS — I5032 Chronic diastolic (congestive) heart failure: Secondary | ICD-10-CM | POA: Diagnosis not present

## 2020-09-22 DIAGNOSIS — R062 Wheezing: Secondary | ICD-10-CM | POA: Diagnosis not present

## 2020-09-24 ENCOUNTER — Other Ambulatory Visit: Payer: Self-pay | Admitting: Internal Medicine

## 2020-10-23 DIAGNOSIS — R062 Wheezing: Secondary | ICD-10-CM | POA: Diagnosis not present

## 2020-10-23 DIAGNOSIS — I5032 Chronic diastolic (congestive) heart failure: Secondary | ICD-10-CM | POA: Diagnosis not present

## 2020-11-02 ENCOUNTER — Emergency Department (HOSPITAL_COMMUNITY)
Admission: EM | Admit: 2020-11-02 | Discharge: 2020-11-03 | Disposition: A | Discharge status: Home / Self Care | Payer: PPO | Attending: Emergency Medicine | Admitting: Emergency Medicine

## 2020-11-02 ENCOUNTER — Emergency Department (HOSPITAL_COMMUNITY): Payer: PPO

## 2020-11-02 ENCOUNTER — Encounter (HOSPITAL_COMMUNITY): Payer: Self-pay | Admitting: Emergency Medicine

## 2020-11-02 ENCOUNTER — Encounter: Payer: Self-pay | Admitting: Family

## 2020-11-02 ENCOUNTER — Ambulatory Visit (INDEPENDENT_AMBULATORY_CARE_PROVIDER_SITE_OTHER): Payer: PPO | Admitting: Family

## 2020-11-02 ENCOUNTER — Other Ambulatory Visit: Payer: Self-pay

## 2020-11-02 VITALS — BP 138/90 | HR 64 | Temp 97.7°F | Resp 16 | Ht 66.0 in | Wt 198.0 lb

## 2020-11-02 DIAGNOSIS — J449 Chronic obstructive pulmonary disease, unspecified: Secondary | ICD-10-CM | POA: Diagnosis not present

## 2020-11-02 DIAGNOSIS — Z7901 Long term (current) use of anticoagulants: Secondary | ICD-10-CM | POA: Diagnosis not present

## 2020-11-02 DIAGNOSIS — K8020 Calculus of gallbladder without cholecystitis without obstruction: Secondary | ICD-10-CM | POA: Diagnosis not present

## 2020-11-02 DIAGNOSIS — R109 Unspecified abdominal pain: Secondary | ICD-10-CM

## 2020-11-02 DIAGNOSIS — I5032 Chronic diastolic (congestive) heart failure: Secondary | ICD-10-CM | POA: Diagnosis not present

## 2020-11-02 DIAGNOSIS — Z87891 Personal history of nicotine dependence: Secondary | ICD-10-CM | POA: Diagnosis not present

## 2020-11-02 DIAGNOSIS — I110 Hypertensive heart disease with heart failure: Secondary | ICD-10-CM | POA: Insufficient documentation

## 2020-11-02 DIAGNOSIS — K5730 Diverticulosis of large intestine without perforation or abscess without bleeding: Secondary | ICD-10-CM | POA: Diagnosis not present

## 2020-11-02 DIAGNOSIS — Z79899 Other long term (current) drug therapy: Secondary | ICD-10-CM | POA: Insufficient documentation

## 2020-11-02 DIAGNOSIS — Z23 Encounter for immunization: Secondary | ICD-10-CM

## 2020-11-02 DIAGNOSIS — N2889 Other specified disorders of kidney and ureter: Secondary | ICD-10-CM | POA: Diagnosis not present

## 2020-11-02 DIAGNOSIS — R079 Chest pain, unspecified: Secondary | ICD-10-CM | POA: Diagnosis not present

## 2020-11-02 DIAGNOSIS — R399 Unspecified symptoms and signs involving the genitourinary system: Secondary | ICD-10-CM

## 2020-11-02 DIAGNOSIS — R319 Hematuria, unspecified: Secondary | ICD-10-CM | POA: Diagnosis not present

## 2020-11-02 DIAGNOSIS — J189 Pneumonia, unspecified organism: Secondary | ICD-10-CM | POA: Diagnosis not present

## 2020-11-02 DIAGNOSIS — J9811 Atelectasis: Secondary | ICD-10-CM | POA: Diagnosis not present

## 2020-11-02 DIAGNOSIS — I517 Cardiomegaly: Secondary | ICD-10-CM | POA: Diagnosis not present

## 2020-11-02 LAB — POCT URINALYSIS DIPSTICK
Glucose, UA: NEGATIVE
Ketones, UA: NEGATIVE
Leukocytes, UA: NEGATIVE
Nitrite, UA: NEGATIVE
Protein, UA: POSITIVE — AB
Spec Grav, UA: 1.02 (ref 1.010–1.025)
Urobilinogen, UA: 0.2 E.U./dL
pH, UA: 6.5 (ref 5.0–8.0)

## 2020-11-02 LAB — CBC
HCT: 46.6 % (ref 39.0–52.0)
Hemoglobin: 14.4 g/dL (ref 13.0–17.0)
MCH: 30.2 pg (ref 26.0–34.0)
MCHC: 30.9 g/dL (ref 30.0–36.0)
MCV: 97.7 fL (ref 80.0–100.0)
Platelets: 179 10*3/uL (ref 150–400)
RBC: 4.77 MIL/uL (ref 4.22–5.81)
RDW: 13.2 % (ref 11.5–15.5)
WBC: 6 10*3/uL (ref 4.0–10.5)
nRBC: 0 % (ref 0.0–0.2)

## 2020-11-02 LAB — BASIC METABOLIC PANEL
Anion gap: 10 (ref 5–15)
BUN: 20 mg/dL (ref 8–23)
CO2: 25 mmol/L (ref 22–32)
Calcium: 9.2 mg/dL (ref 8.9–10.3)
Chloride: 104 mmol/L (ref 98–111)
Creatinine, Ser: 1.56 mg/dL — ABNORMAL HIGH (ref 0.61–1.24)
GFR, Estimated: 43 mL/min — ABNORMAL LOW (ref >60)
Glucose, Bld: 87 mg/dL (ref 70–99)
Potassium: 4.4 mmol/L (ref 3.5–5.1)
Sodium: 139 mmol/L (ref 135–145)

## 2020-11-02 MED ORDER — LIDOCAINE 5 % EX PTCH
1.0000 | MEDICATED_PATCH | CUTANEOUS | Status: DC
  Administered 2020-11-02: 1 via TRANSDERMAL
  Filled 2020-11-02: qty 1

## 2020-11-02 MED ORDER — IOHEXOL 350 MG/ML SOLN
80.0000 mL | Freq: Once | INTRAVENOUS | Status: AC | PRN
  Administered 2020-11-02: 80 mL via INTRAVENOUS

## 2020-11-02 MED ORDER — KETOROLAC TROMETHAMINE 30 MG/ML IJ SOLN: 30.0000 mg | Freq: Once | INTRAMUSCULAR | Status: DC

## 2020-11-02 MED ORDER — ACETAMINOPHEN 325 MG PO TABS
650.0000 mg | ORAL_TABLET | Freq: Once | ORAL | Status: AC
  Administered 2020-11-02: 650 mg via ORAL
  Filled 2020-11-02: qty 2

## 2020-11-02 MED ORDER — FENTANYL CITRATE (PF) 100 MCG/2ML IJ SOLN
50.0000 ug | Freq: Once | INTRAMUSCULAR | Status: AC
  Administered 2020-11-02: 50 ug via INTRAMUSCULAR
  Filled 2020-11-02: qty 2

## 2020-11-02 MED ORDER — KETOROLAC TROMETHAMINE 30 MG/ML IJ SOLN
30.0000 mg | Freq: Once | INTRAMUSCULAR | Status: AC
  Administered 2020-11-02: 30 mg via INTRAMUSCULAR

## 2020-11-02 NOTE — Progress Notes (Signed)
Provider: Bryli Mantey FNP-C  [name], DO  Patient Care Team: [name], DO as PCP - General (Geriatric Medicine) [name], MD as PCP - Cardiology (Cardiology) [name], MD as Consulting Physician (Cardiology) Normajean Glasgow, MD as Attending Physician (Physical Medicine and Rehabilitation) Phylliss Bob, MD as Consulting Physician (Orthopedic Surgery) Wilhelmina Mcardle, MD (Inactive) as Consulting Physician (Pulmonary Disease)  Extended Emergency Contact Information Primary Emergency Contact: Hochstatter,Margie Address: [address]          [place], [place] [address] [place] Phone: [phone] Relation: Spouse Secondary Emergency Contact: Booe,Cindy Address: [place], [place] [address] [place] Phone: [phone] Relation: Daughter  Code Status: Full Code  Goals of care: Advanced Directive information Advanced Directives 11/02/2020  Does Patient Have a Medical Advance Directive? Yes  Type of Paramedic of [place];Living will;Out of facility DNR (pink MOST or yellow form)  Does patient want to make changes to medical advance directive? No - Patient declined  Copy of [place] in Chart? No - copy requested  Would patient like information on creating a medical advance directive? -     Chief Complaint  Patient presents with  . Acute Visit    Pain in right side of left lower back.     HPI:  Pt is a 86 y.o. male seen today for an acute visit for evaluation of right side the back x 1 day.He was watching TV last night when all over sudden he had a sharp stubbing pain on the right side of the back.Pain has been intermittent but very sharp.States had similar pain several years ago when he had a kidney stone.He admits to not drinking enough water.Pain does not radiate any where else. Also has some slight pain on the right lower abdomen.  He denies any  fever,chills,nausea,vomiting,urine urgency,frequency or dysuria. Has had no blood in the urine.    Past Medical History:  Diagnosis Date  . Anxiety   . Arthritis   . Atypical chest pain    a. 05/2017 MV: no ischemia, EF 79%.  . Chest pain 10/20/2017  . Chronic diastolic CHF (congestive heart failure) ([place])    a. 01/2011 Echo: EF 50-55%, gr1 DD, mild AI, nl RV fxn, mild TR/PR; b. 10/2017 Echo: EF 60-65%, mild LVH, gr2 DD.  Marland Kitchen DDD (degenerative disc disease), cervical   . Depressive disorder, not elsewhere classified   . Difficult intubation   . Dysphagia, oral phase   . Dyspnea   . Edema   . Gallstones    a. Symptomatic - s/p lap chole 05/2018.  Marland Kitchen History of DVT (deep vein thrombosis)   . History of kidney stones   . Hypertension   . Hypoxemia   . Impacted cerumen   . Ischemic colitis ([place])    a. 02/2018 GIB - colonoscopy w/ isch colitis. Anticoagulation resumed.  . Long term current use of anticoagulant 03/02/2011  . LOW BACK PAIN SYNDROME 03/17/2009   Qualifier: Diagnosis of  By: Council Mechanic MD, Hilaria Ota   . Mixed hyperlipidemia   . 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 Other myelopathy   . Pain in limb   . Palpitations   . Paroxysmal Atrial Fibrillation ([place])    a. a. 01/2011 in setting of post-op complications including aspiration pna;  b. CHA2DS2VASc = 4--> Amio/Eliquis.  . Pneumonia  03/06/2003  . Spinal stenosis, unspecified region other than cervical   . 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 Syncope    a. 10/2017-->Event monitor: RSR, rare PACs/PVCs.  . Thoracic or lumbosacral neuritis or radiculitis, unspecified    Past Surgical History:  Procedure Laterality Date  . CARDIOVERSION N/A 06/20/2017   Procedure: CARDIOVERSION;  Surgeon: [name], MD;  Location: [facility];  Service: Cardiovascular;  Laterality: N/A;  . CATARACT EXTRACTION W/ INTRAOCULAR LENS  IMPLANT, BILATERAL    . CERVICAL  FUSION  02/10/2011  . CHOLECYSTECTOMY  05/24/2018  . CHOLECYSTECTOMY N/A 05/24/2018   Procedure: LAPAROSCOPIC CHOLECYSTECTOMY;  Surgeon: Coralie Keens, MD;  Location: [facility];  Service: General;  Laterality: N/A;  . COLONOSCOPY WITH PROPOFOL N/A 02/20/2018   Procedure: COLONOSCOPY WITH PROPOFOL;  Surgeon: Lucilla Lame, MD;  Location: [facility];  Service: Endoscopy;  Laterality: N/A;  . history of abd ultrasound  11/01   fatty liver  . MULTIPLE TOOTH EXTRACTIONS    . ORIF FIBULA FRACTURE Left 01/06/2017   Procedure: OPEN REDUCTION INTERNAL FIXATION (ORIF) FIBULA FRACTURE DISTAL FIBULA;  Surgeon: Melrose Nakayama, MD;  Location: [facility];  Service: Orthopedics;  Laterality: Left;  Patient states has problems if he will have a tube in throat for Genera; Anesthesia    Allergies  Allergen Reactions  . Morphine And Related Shortness Of Breath  . Percocet [Oxycodone-Acetaminophen] Shortness Of Breath  . Valium Shortness Of Breath    Outpatient Encounter Medications as of 11/02/2020  Medication Sig  . apixaban (ELIQUIS) 5 MG TABS tablet Take one tablet by mouth twice daily  . cetirizine (ZYRTEC) 10 MG tablet TAKE 1 TABLET BY MOUTH EVERY DAY  . Cholecalciferol (VITAMIN D) 1000 UNITS capsule Take one tablet once daily  . fluticasone (FLONASE) 50 MCG/ACT nasal spray SHAKE LIQUID AND USE 2 SPRAYS IN EACH NOSTRIL DAILY  . Fluticasone-Umeclidin-Vilant (TRELEGY ELLIPTA) 100-62.5-25 MCG/INH AEPB Inhale 1 puff into the lungs daily.  . Fluticasone-Umeclidin-Vilant (TRELEGY ELLIPTA) 100-62.5-25 MCG/INH AEPB Inhale 1 puff into the lungs daily.  . furosemide (LASIX) 40 MG tablet Take 1 tablet (40 mg total) by mouth daily.  Marland Kitchen gabapentin (NEURONTIN) 300 MG capsule Take 1 capsule (300 mg total) by mouth 2 (two) times daily.  Marland Kitchen guaiFENesin (MUCINEX) 600 MG 12 hr tablet Take 2 tablets (1,200 mg total) by mouth 2 (two) times daily as needed for cough or to loosen phlegm.  Marland Kitchen levothyroxine (SYNTHROID) 25 MCG tablet  TAKE 1 TABLET(25 MCG) BY MOUTH DAILY BEFORE BREAKFAST  . metoprolol tartrate (LOPRESSOR) 25 MG tablet TAKE 1 TABLET(25 MG) BY MOUTH TWICE DAILY  . OXYGEN Inhale 4 L into the lungs at bedtime.   . pantoprazole (PROTONIX) 40 MG tablet TAKE 1 TABLET(40 MG) BY MOUTH AT BEDTIME  . potassium chloride SA (K-DUR,KLOR-CON) 10 MEQ tablet Take 1 tablet (10 mEq total) by mouth daily.  . pravastatin (PRAVACHOL) 40 MG tablet TAKE 1 TABLET(40 MG) BY MOUTH DAILY  . [DISCONTINUED] albuterol (PROVENTIL) (2.5 MG/3ML) 0.083% nebulizer solution TAKE 3 MILLILITERS BY NEBULIZATION TWICE DAILY AS NEEDED FOR UP TO 30 DOSES FOR WHEEZING OR SHORTNESS OF BREATH  . [DISCONTINUED] potassium chloride SA (KLOR-CON) 20 MEQ tablet TAKE 1/2 TABLET(10 MEQ) BY MOUTH TWICE DAILY   No facility-administered encounter medications on file as of 11/02/2020.    Review of Systems  Constitutional: Negative for appetite change, chills, fatigue and fever.  Respiratory: Negative for cough, chest tightness, shortness  of breath and wheezing.   Cardiovascular: Negative for chest pain, palpitations and leg swelling.  Gastrointestinal: Negative for abdominal distention, constipation, diarrhea, nausea and vomiting.       Slight pain on right lower abdomen.   Genitourinary: Negative for difficulty urinating, dysuria, flank pain, frequency, hematuria and urgency.  Musculoskeletal: Positive for gait problem. Negative for joint swelling and myalgias.  Psychiatric/Behavioral: Negative for agitation, confusion and sleep disturbance. The patient is not nervous/anxious.     Immunization History  Administered Date(s) Administered  . Fluad Quad(high Dose 65+) 10/11/2019  . Influenza Whole 09/11/1997, 10/11/2007, 10/06/2010  . Influenza, High Dose Seasonal PF 09/01/2017, 08/30/2018  . Influenza,inj,Quad PF,6+ Mos 10/03/2013, 09/15/2014, 09/04/2015  . PFIZER SARS-COV-2 Vaccination 01/18/2020, 02/08/2020  . Pneumococcal Conjugate-13 01/19/2015  .  Pneumococcal Polysaccharide-23 03/05/2002  . Td 01/19/2003  . Tdap 10/14/2013  . Zoster 10/14/2013   Pertinent  Health Maintenance Due  Topic Date Due  . INFLUENZA VACCINE  07/12/2020  . COLONOSCOPY  02/20/2021  . PNA vac Low Risk Adult  Completed   Fall Risk  11/02/2020 07/10/2020 03/16/2020 03/11/2020 02/06/2020  Falls in the past year? 0 0 0 0 0  Number falls in past yr: 0 0 0 0 0  Comment - - - - -  Injury with Fall? 0 0 0 0 0   Functional Status Survey:    Vitals:   11/02/20 1043  BP: 138/90  Pulse: 64  Resp: 16  Temp: 97.7 F (36.5 C)  SpO2: 91%  Weight: 198 lb (89.8 kg)  Height: 5\' 6"  (1.676 m)   Body mass index is 31.96 kg/m. Physical Exam Vitals reviewed.  Constitutional:      General: He is not in acute distress.    Appearance: He is obese. He is not ill-appearing.  HENT:     Mouth/Throat:     Mouth: Mucous membranes are moist.     Pharynx: Oropharynx is clear. No oropharyngeal exudate or posterior oropharyngeal erythema.  Eyes:     General: No scleral icterus.       Right eye: No discharge.        Left eye: No discharge.     Conjunctiva/sclera: Conjunctivae normal.     Pupils: Pupils are equal, round, and reactive to light.     Comments: Corrective lens in place   Cardiovascular:     Rate and Rhythm: Normal rate and regular rhythm.     Pulses: Normal pulses.     Heart sounds: Normal heart sounds. No murmur heard.  No friction rub. No gallop.   Pulmonary:     Effort: Pulmonary effort is normal. No respiratory distress.     Breath sounds: Normal breath sounds. No wheezing, rhonchi or rales.  Chest:     Chest wall: No tenderness.  Abdominal:     General: Bowel sounds are normal. There is no distension.     Palpations: Abdomen is soft. There is no mass.     Tenderness: There is abdominal tenderness. There is right CVA tenderness. There is no left CVA tenderness, guarding or rebound.  Skin:    General: Skin is warm and dry.     Coloration: Skin is not  pale.     Findings: No bruising, erythema or rash.  Neurological:     Mental Status: He is alert.     Cranial Nerves: No cranial nerve deficit.     Sensory: No sensory deficit.     Motor: No weakness.     Coordination:  Coordination normal.     Gait: Gait abnormal.  Psychiatric:        Mood and Affect: Mood normal.        Behavior: Behavior normal.        Thought Content: Thought content normal.        Judgment: Judgment normal.    Labs reviewed: Recent Labs    03/11/20 1143 07/10/20 1112  NA 140 140  K 4.7 4.7  CL 103 103  CO2 30 29  GLUCOSE 78 81  BUN 24 21  CREATININE 1.11 1.45*  CALCIUM 9.5 9.2   Recent Labs    03/11/20 1143 07/10/20 1112  AST 65* 43*  ALT 47* 40  BILITOT 0.9 0.8  PROT 7.3 7.0   Recent Labs    03/11/20 1143 07/10/20 1112  WBC 6.4 6.0  NEUTROABS 2,854 2,742  HGB 15.2 14.5  HCT 47.3 43.5  MCV 98.5 94.6  PLT 197 200   Lab Results  Component Value Date   TSH 3.36 07/10/2020   Lab Results  Component Value Date   HGBA1C 5.7 (H) 05/16/2016   Lab Results  Component Value Date   CHOL 108 07/10/2020   HDL 29 (L) 07/10/2020   LDLCALC 51 07/10/2020   LDLDIRECT 97.4 11/29/2010   TRIG 227 (H) 07/10/2020   CHOLHDL 3.7 07/10/2020    Significant Diagnostic Results in last 30 days:  No results found.  Assessment/Plan  1. Acute right flank pain Worsening sharp stubbing pain x 1 day with right CVA tenderness on percussion and right lower quadrant tenderness to palpation suspicious for nephrolithiasis.unable to get stat CT scan or renal on out patient recommend ED for evaluation of pain to rule out Nephrolithiasis.has had similar symptoms in the past. Urine dip stick shows dark orange colored urine negative for nitrites and Leukocytes but trace blood and protein.    - ketorolac (TORADOL) 30 MG/ML injection 30 mg administered by CMA for pain with some relief.   3. Symptoms of urinary tract infection Afebrile. Right lower quadrant pain  -  Urine dip stick shows dark orange colored urine negative for nitrites and Leukocytes but trace blood and protein. Will send for urine culture. - POC Urinalysis Dipstick  Family/ staff Communication: Reviewed plan of care with patient and POA   Labs/tests ordered: None   Next Appointment: 2 months with Dr.Reed for medical management of chronic issues with Dr.Reed.    Sandrea Hughs, NP

## 2020-11-02 NOTE — ED Notes (Signed)
Pt. Requesting something for pain. I had something earlier but its wore off.

## 2020-11-02 NOTE — ED Triage Notes (Signed)
Pt reports he was sent over from senior care where he was seen for eval of R flank pain, UA showed blood. Hx of renal calculi.

## 2020-11-02 NOTE — ED Notes (Signed)
Pt. Can not take Morphine allergic to it. Pt. Stated, I can take tylenol

## 2020-11-02 NOTE — Patient Instructions (Signed)
-   Please go to ED for evaluation of right flank pain.

## 2020-11-02 NOTE — ED Provider Notes (Signed)
[facility] EMERGENCY DEPARTMENT Provider Note   CSN: [id number] Arrival date & time: 11/02/20  1307     History Chief Complaint  Patient presents with  . Eric [name]    Vi Eric [name] is a 86 y.o. male.  The history is provided by the patient, a relative and medical records.   Eric [name] is a 86 y.o. male who presents to the Emergency Department complaining of flank pain. He presents the emergency department accompanied by his daughter for evaluation of severe right-sided flank pain that started yesterday. Pain is described as sharp and stabbing in nature. It is intermittent. No clear alleviating or worsening factors. It is slowly radiating lower down his back. He has a history of COPD, DVT, a fib, kidney stones. He states that pain feels similar to prior kidney stones. She denies any fevers, chest pain, shortness of breath, abdominal pain, nausea, vomiting, diarrhea, dysuria, hematuria. He had a urinalysis performed at his PCPs office, results reviewed - not consistent with UTI. He was treated with Toradol prior to referral to the emergency department for further evaluation. He states that his pain transiently improved with Toradol but did recur while awaiting to be seen in the emergency department. No reports of recent injuries. He has chronic lower extremity edema and he states that this is his baseline. He uses 4 L nasal cannula oxygen at night but does not require oxygen during the day unless his sats drop.      Past Medical History:  Diagnosis Date  . Anxiety   . Arthritis   . Atypical chest pain    a. 05/2017 MV: no ischemia, EF 79%.  . Chest pain 10/20/2017  . Chronic diastolic CHF (congestive heart failure) ([place])    a. 01/2011 Echo: EF 50-55%, gr1 DD, mild AI, nl RV fxn, mild TR/PR; b. 10/2017 Echo: EF 60-65%, mild LVH, gr2 DD.  Marland Kitchen DDD (degenerative disc disease), cervical   . Depressive disorder, not elsewhere classified   . Difficult intubation   .  Dysphagia, oral phase   . Dyspnea   . Edema   . Gallstones    a. Symptomatic - s/p lap chole 05/2018.  Marland Kitchen History of DVT (deep vein thrombosis)   . History of kidney stones   . Hypertension   . Hypoxemia   . Impacted cerumen   . Ischemic colitis ([place])    a. 02/2018 GIB - colonoscopy w/ isch colitis. Anticoagulation resumed.  . Long term current use of anticoagulant 03/02/2011  . LOW BACK PAIN SYNDROME 03/17/2009   Qualifier: Diagnosis of  By: Council Mechanic MD, Hilaria Ota   . Mixed hyperlipidemia   . 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 Other myelopathy   . Pain in limb   . Palpitations   . Paroxysmal Atrial Fibrillation ([place])    a. a. 01/2011 in setting of post-op complications including aspiration pna;  b. CHA2DS2VASc = 4--> Amio/Eliquis.  . Pneumonia 03/06/2003  . Spinal stenosis, unspecified region other than cervical   . 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 Syncope    a. 10/2017-->Event monitor: RSR, rare PACs/PVCs.  . Thoracic or lumbosacral neuritis or radiculitis, unspecified     Patient Active Problem List   Diagnosis Date Noted  . Dysphagia, pharyngoesophageal phase 03/23/2020  . Cerebellar ataxia in diseases classified elsewhere ([place]) 03/11/2020  . Chronic asthmatic bronchitis ([place]) 03/11/2020  . Chronic  heart failure with preserved ejection fraction ([place]) 03/14/2019  . HYPERTENSION, BENIGN ESSENTIAL 03/14/2019  . Bronchitis 02/27/2019  . Acute on chronic respiratory failure with hypoxia ([place]) 02/27/2019  . CKD (chronic kidney disease), stage III ([place]) 02/27/2019  . Abnormal LFTs 02/27/2019  . Rhinovirus infection 02/27/2019  . Gait abnormality 01/21/2019  . Paresthesia 01/21/2019  . S/P laparoscopic cholecystectomy 05/24/2018  . Blood in stool   . Lower GI bleed   . BRBPR (bright red blood per rectum) 02/18/2018  . Calculus of gallbladder without cholecystitis without obstruction 02/01/2018  .  Right upper quadrant abdominal pain 02/01/2018  . Chest pain 10/20/2017  . Syncope and collapse 10/20/2017  . S/P ORIF (open reduction internal fixation) fracture 03/07/2017  . Lower extremity edema 03/07/2017  . Chronic diastolic CHF (congestive heart failure) ([place]) 01/19/2015  . Cervical myelopathy with cervical radiculopathy ([place]) 06/28/2013  . Cataract of both eyes 06/28/2013  . Anemia of chronic disease 04/14/2013  . Mixed hyperlipidemia   . Spondylosis, cervical, with myelopathy   . Spinal stenosis, lumbar region, with neurogenic claudication   . DDD (degenerative disc disease), cervical   . PAF (paroxysmal atrial fibrillation) ([place])   . Hypertension   . Dizziness 11/09/2011  . Edema 05/18/2011  . Long term current use of anticoagulant 03/02/2011  . VITAMIN D DEFICIENCY 11/29/2010  . Anxiety 07/16/2009  . LOW BACK PAIN SYNDROME 03/17/2009  . NONTRAUMATIC RUPTURE OF TENDONS OF BICEPS 12/01/2008  . ERECTILE DYSFUNCTION, ORGANIC 12/10/2007  . Hyperlipidemia 05/28/2007  . SEXUAL DYSFUNCTION 05/28/2007  . FATTY LIVER DISEASE- ABD. U/S 11/01, MILD ELEVATED LFT'S 05/28/2007    Past Surgical History:  Procedure Laterality Date  . CARDIOVERSION N/A 06/20/2017   Procedure: CARDIOVERSION;  Surgeon: [name], MD;  Location: [facility] ORS;  Service: Cardiovascular;  Laterality: N/A;  . CATARACT EXTRACTION W/ INTRAOCULAR LENS  IMPLANT, BILATERAL    . CERVICAL FUSION  02/10/2011  . CHOLECYSTECTOMY  05/24/2018  . CHOLECYSTECTOMY N/A 05/24/2018   Procedure: LAPAROSCOPIC CHOLECYSTECTOMY;  Surgeon: Coralie Keens, MD;  Location: [facility];  Service: General;  Laterality: N/A;  . COLONOSCOPY WITH PROPOFOL N/A 02/20/2018   Procedure: COLONOSCOPY WITH PROPOFOL;  Surgeon: Lucilla Lame, MD;  Location: [facility] ENDOSCOPY;  Service: Endoscopy;  Laterality: N/A;  . history of abd ultrasound  11/01   fatty liver  . MULTIPLE TOOTH EXTRACTIONS    . ORIF FIBULA FRACTURE Left 01/06/2017   Procedure: OPEN  REDUCTION INTERNAL FIXATION (ORIF) FIBULA FRACTURE DISTAL FIBULA;  Surgeon: Melrose Nakayama, MD;  Location: [facility];  Service: Orthopedics;  Laterality: Left;  Patient states has problems if he will have a tube in throat for Genera; Anesthesia       Family History  Problem Relation Age of Onset  . Stroke Father   . Atrial fibrillation Brother        on coumadin  . Heart disease Brother        AFib- coumadin  . Stroke Paternal Grandfather   . Other Mother        hemorrhage  . Cancer Other        colon cancer at early age  . Prostate cancer Neg Hx   . Kidney cancer Neg Hx   . Bladder Cancer Neg Hx     Social History   Tobacco Use  . Smoking status: Former Smoker    Years: 1.00  . Smokeless tobacco: Never Used  . Tobacco comment: stopped in 20's  Vaping Use  . Vaping Use: Never  used  Substance Use Topics  . Alcohol use: No  . Drug use: No    Home Medications Prior to Admission medications   Medication Sig Start Date End Date Taking? Authorizing Provider  albuterol (PROVENTIL) (2.5 MG/3ML) 0.083% nebulizer solution Take 2.5 mg by nebulization every 6 (six) hours as needed for wheezing or shortness of breath.   Yes [provider]  apixaban (ELIQUIS) 5 MG TABS tablet Take one tablet by mouth twice daily Patient taking differently: Take 5 mg by mouth 2 (two) times daily. Take one tablet by mouth twice daily 06/25/20  Yes Reed, [name] L, DO  cetirizine (ZYRTEC) 10 MG tablet TAKE 1 TABLET BY MOUTH EVERY DAY Patient taking differently: Take 10 mg by mouth daily.  09/24/20  Yes Reed, [name] L, DO  Cholecalciferol (VITAMIN D) 1000 UNITS capsule Take one tablet once daily 08/09/13  Yes Reed, [name] L, DO  fluticasone (FLONASE) 50 MCG/ACT nasal spray SHAKE LIQUID AND USE 2 SPRAYS IN EACH NOSTRIL DAILY Patient taking differently: Place 2 sprays into both nostrils daily.  10/21/19  Yes Reed, [name] L, DO  Fluticasone-Umeclidin-Vilant (TRELEGY ELLIPTA) 100-62.5-25 MCG/INH AEPB  Inhale 1 puff into the lungs daily. 08/26/20  Yes Chesley Mires, MD  furosemide (LASIX) 40 MG tablet Take 1 tablet (40 mg total) by mouth daily. 12/18/19  Yes Gollan, Kathlene November, MD  gabapentin (NEURONTIN) 300 MG capsule Take 1 capsule (300 mg total) by mouth 2 (two) times daily. 03/11/20  Yes Lauree Chandler, NP  guaiFENesin (MUCINEX) 600 MG 12 hr tablet Take 2 tablets (1,200 mg total) by mouth 2 (two) times daily as needed for cough or to loosen phlegm. 06/30/20  Yes Chesley Mires, MD  levothyroxine (SYNTHROID) 25 MCG tablet TAKE 1 TABLET(25 MCG) BY MOUTH DAILY BEFORE BREAKFAST Patient taking differently: Take 25 mcg by mouth daily before breakfast. TAKE 1 TABLET(25 MCG) BY MOUTH DAILY BEFORE BREAKFAST 08/24/20  Yes Reed, [name] L, DO  metoprolol tartrate (LOPRESSOR) 25 MG tablet TAKE 1 TABLET(25 MG) BY MOUTH TWICE DAILY Patient taking differently: Take 25 mg by mouth 2 (two) times daily. TAKE 1 TABLET(25 MG) BY MOUTH TWICE DAILY 07/10/20  Yes Lauree Chandler, NP  pantoprazole (PROTONIX) 40 MG tablet TAKE 1 TABLET(40 MG) BY MOUTH AT BEDTIME Patient taking differently: Take 40 mg by mouth at bedtime.  05/28/20  Yes Gollan, Kathlene November, MD  potassium chloride SA (K-DUR,KLOR-CON) 10 MEQ tablet Take 1 tablet (10 mEq total) by mouth daily. 11/20/18  Yes Gollan, Kathlene November, MD  pravastatin (PRAVACHOL) 40 MG tablet TAKE 1 TABLET(40 MG) BY MOUTH DAILY Patient taking differently: Take 40 mg by mouth every evening.  05/28/20  Yes Reed, [name] L, DO  doxycycline (VIBRAMYCIN) 100 MG capsule Take 1 capsule (100 mg total) by mouth 2 (two) times daily. 11/03/20   Quintella Reichert, MD  lidocaine (LIDODERM) 5 % Place 1 patch onto the skin daily. Remove & Discard patch within 12 hours or as directed by MD 11/03/20   Quintella Reichert, MD  OXYGEN Inhale 4 L into the lungs at bedtime.     [provider]    Allergies    Morphine and related, Percocet [oxycodone-acetaminophen], and Valium  Review of Systems     Review of Systems  All other systems reviewed and are negative.   Physical Exam Updated Vital Signs BP 109/65   Pulse 60   Temp 98.1 F (36.7 C) (Oral)   Resp 19   SpO2 93%   Physical Exam  Vitals and nursing note reviewed.  Constitutional:      Appearance: He is well-developed.     Comments: Chronically ill appearing  HENT:     Head: Normocephalic and atraumatic.  Cardiovascular:     Rate and Rhythm: Normal rate and regular rhythm.     Heart sounds: No murmur heard.   Pulmonary:     Effort: Pulmonary effort is normal. No respiratory distress.     Breath sounds: Normal breath sounds.  Abdominal:     Palpations: Abdomen is soft.     Tenderness: There is no abdominal tenderness. There is no guarding or rebound.  Musculoskeletal:        General: No tenderness.     Comments: 2 to 3+ pitting edema to the right lower extremity, 2+ pitting edema to the left lower extremity. 2+ DP pulses bilaterally. There is mild right CVA tenderness  Skin:    General: Skin is warm and dry.  Neurological:     Mental Status: He is alert and oriented to person, place, and time.  Psychiatric:        Behavior: Behavior normal.     ED Results / Procedures / Treatments   Labs (all labs ordered are listed, but only abnormal results are displayed) Labs Reviewed  BASIC METABOLIC PANEL - Abnormal; Notable for the following components:      Result Value   Creatinine, Ser 1.56 (*)    GFR, Estimated 43 (*)    All other components within normal limits  CBC    EKG None  Radiology CT Abdomen Pelvis W Contrast  Result Date: 11/02/2020 CLINICAL DATA:  Right flank pain with isodense renal lesion seen on earlier abdomen and pelvis CT. EXAM: CT ABDOMEN AND PELVIS WITH CONTRAST TECHNIQUE: Multidetector CT imaging of the abdomen and pelvis was performed using the standard protocol following bolus administration of intravenous contrast. CONTRAST:  74mL OMNIPAQUE IOHEXOL 350 MG/ML SOLN COMPARISON:   November 02, 2020 FINDINGS: Lower chest: Moderate severity atelectasis and/or infiltrate is seen within the bilateral lung bases. Hepatobiliary: No focal liver abnormality is seen. Status post cholecystectomy. No biliary dilatation. Pancreas: Unremarkable. No pancreatic ductal dilatation or surrounding inflammatory changes. Spleen: Normal in size without focal abnormality. Adrenals/Urinary Tract: Adrenal glands are unremarkable. Kidneys are normal in size, without renal calculi or hydronephrosis. 1.0 cm, 1.5 cm and 0.8 cm cystic appearing areas are seen within the right kidney. An additional 0.7 cm nonenhancing, exophytic isodense area is seen adjacent to the anterior aspect of the mid right kidney. Bladder is unremarkable. Stomach/Bowel: Stomach is within normal limits. Appendix appears normal. No evidence of bowel wall thickening, distention, or inflammatory changes. Noninflamed diverticula are seen within the sigmoid colon. Vascular/Lymphatic: There is moderate severity calcification of the abdominal aorta and bilateral common iliac arteries, without evidence of aneurysmal dilatation. No enlarged abdominal or pelvic lymph nodes. Reproductive: The prostate gland is mildly enlarged. Other: No abdominal wall hernia or abnormality. No abdominopelvic ascites. Musculoskeletal: Multilevel degenerative changes are noted throughout the lumbar spine. IMPRESSION: 1. Moderate severity atelectasis and/or infiltrate within the bilateral lung bases. 2. 0.7 cm exophytic isodense renal lesion, as described above. This corresponds to the area of abnormality seen within the right kidney on the earlier abdomen pelvis CT and is considered too small to characterize. While this is likely benign in origin, correlation with six-month follow-up abdomen pelvis CT is recommended to confirm stability. 3. Sigmoid diverticulosis. 4. Evidence of prior cholecystectomy. 5. Mildly enlarged prostate gland. 6. Aortic atherosclerosis. Aortic  Atherosclerosis (ICD10-I70.0). Electronically Signed   By: Virgina Norfolk M.D.   On: 11/02/2020 23:04   CT Renal Stone Study  Result Date: 11/02/2020 CLINICAL DATA:  Flank pain, stone suspected. Right side. Hematuria. EXAM: CT ABDOMEN AND PELVIS WITHOUT CONTRAST TECHNIQUE: Multidetector CT imaging of the abdomen and pelvis was performed following the standard protocol without IV contrast. COMPARISON:  None. FINDINGS: Lower chest: Coronary artery calcifications. Bibasilar atelectasis. Ground-glass airspace opacities Godman be related to motion artifact versus atelectasis versus real finding. Hepatobiliary: No focal liver abnormality. Status post cholecystectomy. No biliary dilatation. Pancreas: No focal lesion. Normal pancreatic contour. No surrounding inflammatory changes. No main pancreatic ductal dilatation. Spleen: Punctate calcifications likely represent sequelae of prior granulomatous disease. Otherwise normal in size without focal abnormality. A splenule is noted anteriorly. Adrenals/Urinary Tract: No adrenal nodule bilaterally. Nonspecific bilateral perinephric stranding. There is a 1.7 cm fluid density lesion within the right kidney that likely represents a simple renal cyst. Suggestion of a 0.8 cm isodense lesion within the right kidney (6:76, 3:37). Subcentimeter hypodensity along the inferior pole of the right kidney is too small to characterize. No Eric [name] or no hydronephrosis. No ureterolithiasis or hydroureter. The urinary bladder is unremarkable. Stomach/Bowel: Stomach is within normal limits. No evidence of bowel wall thickening or dilatation. Couple of scattered sigmoid diverticula. Appendix appears normal. Vascular/Lymphatic: No abdominal aorta. Bilateral common iliac arteries 1.5 cm. Moderate atherosclerotic plaque of the aorta and its branches. No abdominal, pelvic, or inguinal lymphadenopathy. Reproductive: Prostate is unremarkable. Other: No intraperitoneal free fluid. No  intraperitoneal free gas. No organized fluid collection. Musculoskeletal: No abdominal wall hernia or abnormality No suspicious lytic or blastic osseous lesions. No acute displaced fracture. Multilevel severe degenerative changes of the spine. Bilateral at least mild degenerative changes of the hips. IMPRESSION: 1. Suggestion of a 0.8 cm isodense lesion within the right kidney. Consider MRI renal protocol for further evaluation. 2. No nephroureterolithiasis bilaterally. 3. Bibasilar ground-glass airspace opacities. Recommend correlation with PA and lateral view of the chest. 4. Borderline enlarged/aneurysmal bilateral common iliac arteries. Recommend follow-up. 5.  Aortic Atherosclerosis (ICD10-I70.0). Electronically Signed   By: Iven Finn M.D.   On: 11/02/2020 20:14    Procedures Procedures (including critical care time)  Medications Ordered in ED Medications  lidocaine (LIDODERM) 5 % 1 patch (1 patch Transdermal Patch Applied 11/02/20 2151)  doxycycline (VIBRA-TABS) tablet 100 mg (has no administration in time range)  fentaNYL (SUBLIMAZE) injection 50 mcg (50 mcg Intramuscular Given 11/02/20 1906)  acetaminophen (TYLENOL) tablet 650 mg (650 mg Oral Given 11/02/20 2150)  iohexol (OMNIPAQUE) 350 MG/ML injection 80 mL (80 mLs Intravenous Contrast Given 11/02/20 2226)    ED Course  I have reviewed the triage vital signs and the nursing notes.  Pertinent labs & imaging results that were available during my care of the patient were reviewed by me and considered in my medical decision making (see chart for details).    MDM Rules/Calculators/A&P                         patient with history of COPD, DVT, a fib here for evaluation of right sided flank.. CT stone study negative for obstructing stone. UA is not consistent with UTI. Given his history of DVT CTA PE protocol was obtained. CT is negative for PE but does demonstrate possible pneumonia. He has no respiratory distress on evaluation his  breathing is at his baseline. Will treat for atypical pneumonia. Will also treat with Lidoderm  patches for flank pain. Discussed importance of outpatient follow-up and return precautions. Also discussed incidental finding of right renal cyst that will need to be followed up as an outpatient.  Final Clinical Impression(s) / ED Diagnoses Final diagnoses:  Right flank pain    Rx / DC Orders ED Discharge Orders         Ordered    lidocaine (LIDODERM) 5 %  Every 24 hours        11/03/20 0009    doxycycline (VIBRAMYCIN) 100 MG capsule  2 times daily        11/03/20 0009           Quintella Reichert, MD 11/03/20 0025

## 2020-11-03 LAB — URINE CULTURE
MICRO NUMBER:: 11234645
SPECIMEN QUALITY:: ADEQUATE

## 2020-11-03 MED ORDER — DOXYCYCLINE HYCLATE 100 MG PO CAPS: 100.0000 mg | ORAL_CAPSULE | Freq: Two times a day (BID) | ORAL | 0 refills | Status: DC

## 2020-11-03 MED ORDER — DOXYCYCLINE HYCLATE 100 MG PO TABS
100.0000 mg | ORAL_TABLET | Freq: Once | ORAL | Status: AC
  Administered 2020-11-03: 100 mg via ORAL
  Filled 2020-11-03: qty 1

## 2020-11-03 MED ORDER — LIDOCAINE 5 % EX PTCH: 1.0000 | MEDICATED_PATCH | CUTANEOUS | 0 refills | Status: DC

## 2020-11-03 NOTE — Discharge Instructions (Signed)
You had a CT scan today that showed a small cyst on your right kidney.  You will need to see your family doctor for recheck.

## 2020-11-04 ENCOUNTER — Ambulatory Visit: Payer: PPO | Admitting: Nurse Practitioner

## 2020-11-09 ENCOUNTER — Ambulatory Visit: Payer: PPO | Admitting: Nurse Practitioner

## 2020-11-22 DIAGNOSIS — I5032 Chronic diastolic (congestive) heart failure: Secondary | ICD-10-CM | POA: Diagnosis not present

## 2020-11-22 DIAGNOSIS — R062 Wheezing: Secondary | ICD-10-CM | POA: Diagnosis not present

## 2020-12-07 ENCOUNTER — Other Ambulatory Visit: Payer: Self-pay | Admitting: Nurse Practitioner

## 2020-12-07 DIAGNOSIS — M5442 Lumbago with sciatica, left side: Secondary | ICD-10-CM

## 2020-12-18 ENCOUNTER — Other Ambulatory Visit: Payer: Self-pay | Admitting: Internal Medicine

## 2020-12-18 DIAGNOSIS — R7989 Other specified abnormal findings of blood chemistry: Secondary | ICD-10-CM

## 2020-12-18 DIAGNOSIS — E039 Hypothyroidism, unspecified: Secondary | ICD-10-CM

## 2020-12-18 DIAGNOSIS — E782 Mixed hyperlipidemia: Secondary | ICD-10-CM

## 2020-12-22 ENCOUNTER — Ambulatory Visit: Payer: PPO | Admitting: Pulmonary Disease

## 2020-12-22 ENCOUNTER — Encounter: Payer: Self-pay | Admitting: Pulmonary Disease

## 2020-12-22 ENCOUNTER — Other Ambulatory Visit: Payer: Self-pay

## 2020-12-22 VITALS — BP 132/82 | HR 60 | Temp 99.0°F | Ht 66.0 in | Wt 195.4 lb

## 2020-12-22 DIAGNOSIS — J449 Chronic obstructive pulmonary disease, unspecified: Secondary | ICD-10-CM | POA: Diagnosis not present

## 2020-12-22 DIAGNOSIS — J9611 Chronic respiratory failure with hypoxia: Secondary | ICD-10-CM

## 2020-12-22 DIAGNOSIS — J849 Interstitial pulmonary disease, unspecified: Secondary | ICD-10-CM | POA: Diagnosis not present

## 2020-12-22 MED ORDER — TRELEGY ELLIPTA 100-62.5-25 MCG/INH IN AEPB: 1.0000 | INHALATION_SPRAY | Freq: Every day | RESPIRATORY_TRACT | 0 refills | Status: DC

## 2020-12-22 NOTE — Progress Notes (Signed)
Eric [name], Critical Care, and Sleep Medicine  Chief Complaint  Patient presents with  . Follow-up    SOB with ambulation, dry cough, wheezing, chocking when eating      Constitutional:  BP 132/82 (BP Location: Right Arm, Cuff Size: Normal)   Pulse 60   Temp 99 F (37.2 C)   Ht 5\' 6"  (1.676 m)   Wt 195 lb 6.4 oz (88.6 kg)   SpO2 96%   BMI 31.54 kg/m   Past Medical History:  Anxiety, OA, Chronic diastolic CHF, Depression, Dysphagia, DVT, Nephrolithiasis, HTN, Ischemic colitis, Back pain, HLD, PAF, PNA 2004, Spinal stenosis  Summary:  Eric [name] is a 87 y.o. 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  Subjective:   He is here with his daughter.  He was seen in ER in November 2021 for flank pain and hematuria.  During this assessment he had CT angio chest.  Showed GGO in posterior segments of upper and lower lobes.  He reports having trouble with reflux and his swallowing.  Was seen by speech therapy a year ago.  He sleeps with his head elevated.  Has occasional cough with clear to yellow sputum.  Not having chest pain, fever, or hemoptysis.  Uses 4 liters oxygen at night.  Physical Exam:    Appearance - uses a walker  ENMT - no sinus tenderness, no oral exudate, no LAN, Mallampati 3 airway, no stridor, poor dentition  Respiratory - scattered rhonchi that clears with cough, no wheeze/rales  CV - s1s2 regular rate and rhythm, no murmurs  Ext - no clubbing, no edema  Skin - no rashes  Psych - normal mood and affect     Assessment/Plan:   COPD with chronic bronchitis. - continue trelegy; samples given - prn albuterol, mucinex - advised he could switch to 90 day supply of meds when he is due for refill  Chronic respiratory failure with hypoxia. - uses 4 liters oxygen at night  Interstitial lung disease. - I am concerned he is having recurrent aspiration that is causing scarring in his lungs - he would like to  monitor his symptoms for now and then decide if he would like follow up with speech therapy - will reassess his status at next visit and then determine when he should have repeat imaging studies   Time spent:  24 minutes  Follow up:  Patient Instructions  Follow up in 4 months   Signature:  Chesley Mires, MD [place] Pager: [phone] 12/22/2020, 11:39 AM  Flow Sheet     [name] tests:   PFT 12/25/18 >> FEV1 1.10 (45%), FEV1% 64, TLC 3.81 (60%)  Chest imaging:   CT angio chest 11/02/20 >> b/l GGO in posterior segments of upper lobes and lower lobes  Sleep tests:   ONO with 2 liters 05/22/20 >> test time 8 hrs 32 min.  Baseline SpO2 91%, SpO2 low 64%.  Spent 57 min 8 sec with SpO2 < 88%.  SpO2 graph suggestive of REM related desaturation pattern that could be seen in sleep apnea.  Cardiac tests:   Echo 10/20/17 >> EF 60 to 65%, mild LVH, grade 2 DD  Medications:   Allergies as of 12/22/2020      Reactions   Morphine And Related Shortness Of Breath   Percocet [oxycodone-acetaminophen] Shortness Of Breath   Valium Shortness Of Breath      Medication List       Accurate as of December 22, 2020 11:39 AM. If you have any questions, ask your nurse or doctor.        albuterol (2.5 MG/3ML) 0.083% nebulizer solution Commonly known as: PROVENTIL Take 2.5 mg by nebulization every 6 (six) hours as needed for wheezing or shortness of breath.   apixaban 5 MG Tabs tablet Commonly known as: Eliquis Take one tablet by mouth twice daily What changed:   how much to take  how to take this  when to take this   cetirizine 10 MG tablet Commonly known as: ZYRTEC TAKE 1 TABLET BY MOUTH EVERY DAY   doxycycline 100 MG capsule Commonly known as: VIBRAMYCIN Take 1 capsule (100 mg total) by mouth 2 (two) times daily.   fluticasone 50 MCG/ACT nasal spray Commonly known as: FLONASE SHAKE LIQUID AND USE 2 SPRAYS IN EACH NOSTRIL DAILY What changed: See  the new instructions.   furosemide 40 MG tablet Commonly known as: LASIX Take 1 tablet (40 mg total) by mouth daily.   gabapentin 300 MG capsule Commonly known as: NEURONTIN TAKE 1 CAPSULE(300 MG) BY MOUTH TWICE DAILY   guaiFENesin 600 MG 12 hr tablet Commonly known as: Mucinex Take 2 tablets (1,200 mg total) by mouth 2 (two) times daily as needed for cough or to loosen phlegm.   levothyroxine 25 MCG tablet Commonly known as: SYNTHROID TAKE 1 TABLET(25 MCG) BY MOUTH DAILY BEFORE BREAKFAST   lidocaine 5 % Commonly known as: Lidoderm Place 1 patch onto the skin daily. Remove & Discard patch within 12 hours or as directed by MD   metoprolol tartrate 25 MG tablet Commonly known as: LOPRESSOR TAKE 1 TABLET(25 MG) BY MOUTH TWICE DAILY What changed:   how much to take  how to take this  when to take this   OXYGEN Inhale 4 L into the lungs at bedtime.   pantoprazole 40 MG tablet Commonly known as: PROTONIX TAKE 1 TABLET(40 MG) BY MOUTH AT BEDTIME What changed: See the new instructions.   potassium chloride 10 MEQ tablet Commonly known as: KLOR-CON Take 1 tablet (10 mEq total) by mouth daily.   pravastatin 40 MG tablet Commonly known as: PRAVACHOL TAKE 1 TABLET(40 MG) BY MOUTH DAILY   Trelegy Ellipta 100-62.5-25 MCG/INH Aepb Generic drug: Fluticasone-Umeclidin-Vilant Inhale 1 puff into the lungs daily.   Vitamin D 1000 units capsule Take one tablet once daily       Past Surgical History:  He  has a past surgical history that includes history of abd ultrasound (11/01); Cervical fusion (02/10/2011); Cataract extraction w/ intraocular lens  implant, bilateral; Multiple tooth extractions; ORIF fibula fracture (Left, 01/06/2017); CARDIOVERSION (N/A, 06/20/2017); Colonoscopy with propofol (N/A, 02/20/2018); Cholecystectomy (05/24/2018); and Cholecystectomy (N/A, 05/24/2018).  Family History:  His family history includes Atrial fibrillation in his brother; Cancer in an other  family member; Heart disease in his brother; Other in his mother; Stroke in his father and paternal grandfather.  Social History:  He  reports that he has quit smoking. His smoking use included cigarettes. He quit after 1.00 year of use. He has never used smokeless tobacco. He reports that he does not drink alcohol and does not use drugs.

## 2020-12-22 NOTE — Addendum Note (Signed)
Addended by: Gavin Potters R on: 12/22/2020 11:50 AM   Modules accepted: Orders

## 2020-12-22 NOTE — Patient Instructions (Signed)
Follow up in 4 months 

## 2020-12-25 ENCOUNTER — Ambulatory Visit: Payer: PPO | Admitting: Pulmonary Disease

## 2020-12-28 ENCOUNTER — Other Ambulatory Visit: Payer: Self-pay

## 2020-12-28 ENCOUNTER — Encounter: Payer: Self-pay | Admitting: Nurse Practitioner

## 2020-12-28 ENCOUNTER — Ambulatory Visit (INDEPENDENT_AMBULATORY_CARE_PROVIDER_SITE_OTHER): Payer: PPO | Admitting: Nurse Practitioner

## 2020-12-28 DIAGNOSIS — I480 Paroxysmal atrial fibrillation: Secondary | ICD-10-CM

## 2020-12-28 DIAGNOSIS — I10 Essential (primary) hypertension: Secondary | ICD-10-CM | POA: Diagnosis not present

## 2020-12-28 DIAGNOSIS — N1832 Chronic kidney disease, stage 3b: Secondary | ICD-10-CM | POA: Diagnosis not present

## 2020-12-28 DIAGNOSIS — R600 Localized edema: Secondary | ICD-10-CM

## 2020-12-28 DIAGNOSIS — E782 Mixed hyperlipidemia: Secondary | ICD-10-CM | POA: Diagnosis not present

## 2020-12-28 DIAGNOSIS — M48062 Spinal stenosis, lumbar region with neurogenic claudication: Secondary | ICD-10-CM | POA: Diagnosis not present

## 2020-12-28 DIAGNOSIS — E039 Hypothyroidism, unspecified: Secondary | ICD-10-CM

## 2020-12-28 DIAGNOSIS — I5032 Chronic diastolic (congestive) heart failure: Secondary | ICD-10-CM

## 2020-12-28 NOTE — Progress Notes (Signed)
Careteam: Patient Care Team: [name], DO as PCP - General (Geriatric Medicine) [name], MD as PCP - Cardiology (Cardiology) [name], MD as Consulting Physician (Cardiology) Normajean Glasgow, MD as Attending Physician (Physical Medicine and Rehabilitation) Phylliss Bob, MD as Consulting Physician (Orthopedic Surgery) Wilhelmina Mcardle, MD (Inactive) as Consulting Physician (Pulmonary Disease)  Advanced Directive information Type of Advance Directive: Healthcare Power of [place];Living will, Does patient want to make changes to medical advance directive?: No - Patient declined  Allergies  Allergen Reactions  . Morphine And Related Shortness Of Breath  . Percocet [Oxycodone-Acetaminophen] Shortness Of Breath  . Valium Shortness Of Breath    Chief Complaint  Patient presents with  . Medical Management of Chronic Issues    2 month follow-up via telephone visit. Discuss need for coivd booster (patient plans to get in the near future) and colonoscopy.      HPI: Patient is a 87 y.o. male for routine follow up.  Reports he is doing well.  Did not take vital signs for visit. Does not have home BP cuff.   COPD- saw pulmonary last week. Reports breathing has been "off and on" continues on trelegy and albuterol PRN (used 3 times in the last week)  Using 4L [place] at night.  Scar tissue noted in lungs from CT scan, concerning chronic aspiration, he did not wish to have ST evaluation.    Neuropathy- controlled on gabapentin.      Review of Systems:  Review of Systems  Constitutional: Negative for chills, fever and weight loss.  HENT: Negative for tinnitus.   Respiratory: Positive for cough and shortness of breath. Negative for sputum production.   Cardiovascular: Negative for chest pain, palpitations and leg swelling.  Gastrointestinal: Negative for abdominal pain, constipation, diarrhea and heartburn.  Genitourinary: Negative for dysuria, frequency and  urgency.  Musculoskeletal: Negative for back pain, falls, joint pain and myalgias.  Skin: Negative.   Neurological: Positive for tingling. Negative for dizziness and headaches.  Psychiatric/Behavioral: Negative for depression and memory loss. The patient is not nervous/anxious and does not have insomnia.     Past Medical History:  Diagnosis Date  . Anxiety   . Arthritis   . Atypical chest pain    a. 05/2017 MV: no ischemia, EF 79%.  . Chest pain 10/20/2017  . Chronic diastolic CHF (congestive heart failure) ([place])    a. 01/2011 Echo: EF 50-55%, gr1 DD, mild AI, nl RV fxn, mild TR/PR; b. 10/2017 Echo: EF 60-65%, mild LVH, gr2 DD.  Marland Kitchen DDD (degenerative disc disease), cervical   . Depressive disorder, not elsewhere classified   . Difficult intubation   . Dysphagia, oral phase   . Dyspnea   . Edema   . Gallstones    a. Symptomatic - s/p lap chole 05/2018.  Marland Kitchen History of DVT (deep vein thrombosis)   . History of kidney stones   . Hypertension   . Hypoxemia   . Impacted cerumen   . Ischemic colitis ([place])    a. 02/2018 GIB - colonoscopy w/ isch colitis. Anticoagulation resumed.  . Long term current use of anticoagulant 03/02/2011  . LOW BACK PAIN SYNDROME 03/17/2009   Qualifier: Diagnosis of  By: Council Mechanic MD, Hilaria Ota   . Mixed hyperlipidemia   . 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 Other myelopathy   . Pain in limb   . Palpitations   . Paroxysmal Atrial Fibrillation ([place])  a. a. 01/2011 in setting of post-op complications including aspiration pna;  b. CHA2DS2VASc = 4--> Amio/Eliquis.  . Pneumonia 03/06/2003  . Spinal stenosis, unspecified region other than cervical   . 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 Syncope    a. 10/2017-->Event monitor: RSR, rare PACs/PVCs.  . Thoracic or lumbosacral neuritis or radiculitis, unspecified    Past Surgical History:  Procedure Laterality Date  . CARDIOVERSION N/A  06/20/2017   Procedure: CARDIOVERSION;  Surgeon: [name], MD;  Location: [facility];  Service: Cardiovascular;  Laterality: N/A;  . CATARACT EXTRACTION W/ INTRAOCULAR LENS  IMPLANT, BILATERAL    . CERVICAL FUSION  02/10/2011  . CHOLECYSTECTOMY  05/24/2018  . CHOLECYSTECTOMY N/A 05/24/2018   Procedure: LAPAROSCOPIC CHOLECYSTECTOMY;  Surgeon: Coralie Keens, MD;  Location: [place];  Service: General;  Laterality: N/A;  . COLONOSCOPY WITH PROPOFOL N/A 02/20/2018   Procedure: COLONOSCOPY WITH PROPOFOL;  Surgeon: Lucilla Lame, MD;  Location: [facility];  Service: Endoscopy;  Laterality: N/A;  . history of abd ultrasound  11/01   fatty liver  . MULTIPLE TOOTH EXTRACTIONS    . ORIF FIBULA FRACTURE Left 01/06/2017   Procedure: OPEN REDUCTION INTERNAL FIXATION (ORIF) FIBULA FRACTURE DISTAL FIBULA;  Surgeon: Melrose Nakayama, MD;  Location: [place];  Service: Orthopedics;  Laterality: Left;  Patient states has problems if he will have a tube in throat for Genera; Anesthesia   Social History:   reports that he has quit smoking. His smoking use included cigarettes. He quit after 1.00 year of use. He has never used smokeless tobacco. He reports that he does not drink alcohol and does not use drugs.  Family History  Problem Relation Age of Onset  . Stroke Father   . Atrial fibrillation Brother        on coumadin  . Heart disease Brother        AFib- coumadin  . Stroke Paternal Grandfather   . Other Mother        hemorrhage  . Cancer Other        colon cancer at early age  . Prostate cancer Neg Hx   . Kidney cancer Neg Hx   . Bladder Cancer Neg Hx     Medications: Patient's Medications  New Prescriptions   No medications on file  Previous Medications   ALBUTEROL (PROVENTIL) (2.5 MG/3ML) 0.083% NEBULIZER SOLUTION    Take 2.5 mg by nebulization every 6 (six) hours as needed for wheezing or shortness of breath.   APIXABAN (ELIQUIS) 5 MG TABS TABLET    Take one tablet by mouth twice daily    CETIRIZINE (ZYRTEC) 10 MG TABLET    TAKE 1 TABLET BY MOUTH EVERY DAY   CHOLECALCIFEROL (VITAMIN D) 1000 UNITS CAPSULE    Take one tablet once daily   FLUTICASONE (FLONASE) 50 MCG/ACT NASAL SPRAY    SHAKE LIQUID AND USE 2 SPRAYS IN EACH NOSTRIL DAILY   FLUTICASONE-UMECLIDIN-VILANT (TRELEGY ELLIPTA) 100-62.5-25 MCG/INH AEPB    Inhale 1 puff into the lungs daily.   FUROSEMIDE (LASIX) 40 MG TABLET    Take 1 tablet (40 mg total) by mouth daily.   GABAPENTIN (NEURONTIN) 300 MG CAPSULE    TAKE 1 CAPSULE(300 MG) BY MOUTH TWICE DAILY   GUAIFENESIN (MUCINEX) 600 MG 12 HR TABLET    Take 2 tablets (1,200 mg total) by mouth 2 (two) times daily as needed for cough or to loosen phlegm.  LEVOTHYROXINE (SYNTHROID) 25 MCG TABLET    TAKE 1 TABLET(25 MCG) BY MOUTH DAILY BEFORE BREAKFAST   LIDOCAINE (LIDODERM) 5 %    Place 1 patch onto the skin daily. Remove & Discard patch within 12 hours or as directed by MD   METOPROLOL TARTRATE (LOPRESSOR) 25 MG TABLET    TAKE 1 TABLET(25 MG) BY MOUTH TWICE DAILY   OXYGEN    Inhale 4 L into the lungs at bedtime.    PANTOPRAZOLE (PROTONIX) 40 MG TABLET    TAKE 1 TABLET(40 MG) BY MOUTH AT BEDTIME   POTASSIUM CHLORIDE SA (K-DUR,KLOR-CON) 10 MEQ TABLET    Take 1 tablet (10 mEq total) by mouth daily.   PRAVASTATIN (PRAVACHOL) 40 MG TABLET    TAKE 1 TABLET(40 MG) BY MOUTH DAILY  Modified Medications   No medications on file  Discontinued Medications   DOXYCYCLINE (VIBRAMYCIN) 100 MG CAPSULE    Take 1 capsule (100 mg total) by mouth 2 (two) times daily.   FLUTICASONE-UMECLIDIN-VILANT (TRELEGY ELLIPTA) 100-62.5-25 MCG/INH AEPB    Inhale 1 puff into the lungs daily.    Physical Exam:  There were no vitals filed for this visit. There is no height or weight on file to calculate BMI. Wt Readings from Last 3 Encounters:  12/22/20 195 lb 6.4 oz (88.6 kg)  11/02/20 198 lb (89.8 kg)  07/10/20 199 lb (90.3 kg)      Labs reviewed: Basic Metabolic Panel: Recent Labs     01/27/20 1019 03/11/20 1143 07/10/20 1112 11/02/20 1337  NA  --  140 140 139  K  --  4.7 4.7 4.4  CL  --  103 103 104  CO2  --  30 29 25   GLUCOSE  --  78 81 87  BUN  --  24 21 20   CREATININE  --  1.11 1.45* 1.56*  CALCIUM  --  9.5 9.2 9.2  TSH 16.42* 8.83* 3.36  --    Liver Function Tests: Recent Labs    03/11/20 1143 07/10/20 1112  AST 65* 43*  ALT 47* 40  BILITOT 0.9 0.8  PROT 7.3 7.0   No results for input(s): LIPASE, AMYLASE in the last 8760 hours. No results for input(s): AMMONIA in the last 8760 hours. CBC: Recent Labs    03/11/20 1143 07/10/20 1112 11/02/20 1337  WBC 6.4 6.0 6.0  NEUTROABS 2,854 2,742  --   HGB 15.2 14.5 14.4  HCT 47.3 43.5 46.6  MCV 98.5 94.6 97.7  PLT 197 200 179   Lipid Panel: Recent Labs    03/11/20 1143 07/10/20 1112  CHOL 128 108  HDL 31* 29*  LDLCALC 67 51  TRIG 242* 227*  CHOLHDL 4.1 3.7   TSH: Recent Labs    01/27/20 1019 03/11/20 1143 07/10/20 1112  TSH 16.42* 8.83* 3.36   A1C: Lab Results  Component Value Date   HGBA1C 5.7 (H) 05/16/2016     Assessment/Plan 1. Hypothyroidism, unspecified type -continues on synthroid 25 mcg - TSH; Future  2. Chronic diastolic CHF (congestive heart failure) (HCC) Stable without worsening LE edema, weight gain.  Continues on lasix 40 mg daily with potassium  3. Stage 3b chronic kidney disease ([place]) -Encourage proper hydration and to avoid NSAIDS (Aleve, Advil, Motrin, Ibuprofen)   4. Mixed hyperlipidemia Continues on Pravachol with dietary modifications encouraged.  - CMP with eGFR([facility]); Future - Lipid Panel; Future  5. Spinal stenosis, lumbar region, with neurogenic claudication Stable, continues gabapentin   6. Paroxysmal atrial fibrillation (HCC)  Rate controlled on metoprolol and continues on elquis 5 mg BID  7. Essential hypertension Continues on metoprolol   8. Lower extremity edema Stable on lasix 40 mg daily with potassium supplement Continue  compression hose as tolerates, elevation and low sodium diet.   Next appt: 3 months,  Labs in the next week or 2 [place] K. Harle Battiest  [facility] [phone]    Virtual Visit via telephone  I connected with patient on 12/28/20 at 10:30 AM EST by telephone and verified that I am speaking with the correct person using two identifiers.  Location: Patient: home Provider: home, remote   I discussed the limitations, risks, security and privacy concerns of performing an evaluation and management service by telephone and the availability of in person appointments. I also discussed with the patient that there [place] be a patient responsible charge related to this service. The patient expressed understanding and agreed to proceed.   I discussed the assessment and treatment plan with the patient. The patient was provided an opportunity to ask questions and all were answered. The patient agreed with the plan and demonstrated an understanding of the instructions.   The patient was advised to call back or seek an in-person evaluation if the symptoms worsen or if the condition fails to improve as anticipated.  I provided 25 minutes of non-face-to-face time during this encounter.  Carlos American. Harle Battiest Avs printed and mailed

## 2020-12-28 NOTE — Progress Notes (Signed)
   This service is provided via telemedicine  No vital signs collected/recorded due to the encounter was a telemedicine visit.   Location of patient (ex: home, work):  Home  Patient consents to a telephone visit: Yes, see telephone visit dated 03/16/2020  Location of the provider (ex: office, home): Home, working remote   Name of any referring provider:  N/A  Names of all persons participating in the telemedicine service and their role in the encounter:  S.Chrae B/CMA, Sherrie Mustache, NP, and Patient   Time spent on call:  7 min with medical assistant

## 2020-12-31 ENCOUNTER — Other Ambulatory Visit: Payer: Self-pay

## 2020-12-31 ENCOUNTER — Encounter: Payer: Self-pay | Admitting: Internal Medicine

## 2020-12-31 ENCOUNTER — Ambulatory Visit (INDEPENDENT_AMBULATORY_CARE_PROVIDER_SITE_OTHER): Payer: PPO | Admitting: Internal Medicine

## 2020-12-31 VITALS — BP 124/82 | HR 88 | Temp 97.1°F | Ht 66.0 in | Wt 195.4 lb

## 2020-12-31 DIAGNOSIS — Z20822 Contact with and (suspected) exposure to covid-19: Secondary | ICD-10-CM | POA: Diagnosis not present

## 2020-12-31 DIAGNOSIS — E039 Hypothyroidism, unspecified: Secondary | ICD-10-CM

## 2020-12-31 DIAGNOSIS — J441 Chronic obstructive pulmonary disease with (acute) exacerbation: Secondary | ICD-10-CM | POA: Diagnosis not present

## 2020-12-31 DIAGNOSIS — R058 Other specified cough: Secondary | ICD-10-CM | POA: Diagnosis not present

## 2020-12-31 DIAGNOSIS — E782 Mixed hyperlipidemia: Secondary | ICD-10-CM

## 2020-12-31 DIAGNOSIS — I5032 Chronic diastolic (congestive) heart failure: Secondary | ICD-10-CM

## 2020-12-31 LAB — POCT INFLUENZA A/B
Influenza A, POC: NEGATIVE
Influenza B, POC: NEGATIVE

## 2020-12-31 MED ORDER — CETIRIZINE HCL 10 MG PO TABS
10.0000 mg | ORAL_TABLET | Freq: Every day | ORAL | 3 refills | Status: DC
Start: 2020-12-31 — End: 2021-11-30

## 2020-12-31 MED ORDER — DOXYCYCLINE HYCLATE 100 MG PO TABS: 100.0000 mg | ORAL_TABLET | Freq: Two times a day (BID) | ORAL | 0 refills | Status: DC

## 2020-12-31 MED ORDER — PREDNISONE 10 MG (21) PO TBPK: ORAL_TABLET | ORAL | 0 refills | Status: DC

## 2020-12-31 NOTE — Patient Instructions (Signed)
It looks like you have a COPD exacerbation: Start doxycycline 100mg  twice a day for 10 days. I have also sent in a prednisone taper for you.   Use your nebulizer every 6 hours while you are up and about. Wear your oxygen if you are short of breath even in the daytime until you get better. Be sure to take your lasix as directed.  Do not skip it.  If you go somewhere, take it afterwards. Your flu test was negative.  covid was sent off Stay at home for the next 5 days.  After that, be sure to wear a mask out in public.    Let us know if you are not starting to get better Monday.

## 2020-12-31 NOTE — Progress Notes (Signed)
Location:  [facility] clinic Provider: Nayelli Inglis L. Mariea Clonts, D.O., C.M.D.  Goals of Care:  Advanced Directives 12/31/2020  Does Patient Have a Medical Advance Directive? -  Type of Paramedic of [place];Living will  Does patient want to make changes to medical advance directive? No - Patient declined  Copy of [place] in Chart? No - copy requested  Would patient like information on creating a medical advance directive? -     Chief Complaint  Patient presents with  . Acute Visit    Sob, cough, and congestion     HPI: Patient is a 87 y.o. male with COPD and CHF seen today for an acute visit for shortness of breath, cough and congestion.  Tests run for flu and covid.  Flu negative.  He notes this just started.  He was quite dyspneic and sats were just 84%.  He uses O2 at night.  His daughter-in-law shares that he does not always take his lasix, but he says he takes it everyday and took two yesterday.  He thinks the extra one [place] have helped.  He's not able to bring up any mucus.  He's had no fever.  Weight stable.  He was drowsy when I came in but oxygen had been low and he perked up when I put oxygen on him in the room.    Past Medical History:  Diagnosis Date  . Anxiety   . Arthritis   . Atypical chest pain    a. 05/2017 MV: no ischemia, EF 79%.  . Chest pain 10/20/2017  . Chronic diastolic CHF (congestive heart failure) ([place])    a. 01/2011 Echo: EF 50-55%, gr1 DD, mild AI, nl RV fxn, mild TR/PR; b. 10/2017 Echo: EF 60-65%, mild LVH, gr2 DD.  Marland Kitchen DDD (degenerative disc disease), cervical   . Depressive disorder, not elsewhere classified   . Difficult intubation   . Dysphagia, oral phase   . Dyspnea   . Edema   . Gallstones    a. Symptomatic - s/p lap chole 05/2018.  Marland Kitchen History of DVT (deep vein thrombosis)   . History of kidney stones   . Hypertension   . Hypoxemia   . Impacted cerumen   . Ischemic colitis ([place])    a. 02/2018 GIB - colonoscopy w/  isch colitis. Anticoagulation resumed.  . Long term current use of anticoagulant 03/02/2011  . LOW BACK PAIN SYNDROME 03/17/2009   Qualifier: Diagnosis of  By: Council Mechanic MD, Hilaria Ota   . Mixed hyperlipidemia   . 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 Other myelopathy   . Pain in limb   . Palpitations   . Paroxysmal Atrial Fibrillation ([place])    a. a. 01/2011 in setting of post-op complications including aspiration pna;  b. CHA2DS2VASc = 4--> Amio/Eliquis.  . Pneumonia 03/06/2003  . Spinal stenosis, unspecified region other than cervical   . 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 Syncope    a. 10/2017-->Event monitor: RSR, rare PACs/PVCs.  . Thoracic or lumbosacral neuritis or radiculitis, unspecified     Past Surgical History:  Procedure Laterality Date  . CARDIOVERSION N/A 06/20/2017   Procedure: CARDIOVERSION;  Surgeon: [name], MD;  Location: [facility] ORS;  Service: Cardiovascular;  Laterality: N/A;  . CATARACT EXTRACTION W/ INTRAOCULAR LENS  IMPLANT, BILATERAL    . CERVICAL FUSION  02/10/2011  . CHOLECYSTECTOMY  05/24/2018  . CHOLECYSTECTOMY N/A 05/24/2018   Procedure: LAPAROSCOPIC CHOLECYSTECTOMY;  Surgeon: Coralie Keens, MD;  Location: [place];  Service: General;  Laterality: N/A;  . COLONOSCOPY WITH PROPOFOL N/A 02/20/2018   Procedure: COLONOSCOPY WITH PROPOFOL;  Surgeon: Lucilla Lame, MD;  Location: [facility] ENDOSCOPY;  Service: Endoscopy;  Laterality: N/A;  . history of abd ultrasound  11/01   fatty liver  . MULTIPLE TOOTH EXTRACTIONS    . ORIF FIBULA FRACTURE Left 01/06/2017   Procedure: OPEN REDUCTION INTERNAL FIXATION (ORIF) FIBULA FRACTURE DISTAL FIBULA;  Surgeon: Melrose Nakayama, MD;  Location: [place];  Service: Orthopedics;  Laterality: Left;  Patient states has problems if he will have a tube in throat for Genera; Anesthesia    Allergies  Allergen Reactions  . Morphine And Related Shortness Of  Breath  . Percocet [Oxycodone-Acetaminophen] Shortness Of Breath  . Valium Shortness Of Breath    Outpatient Encounter Medications as of 12/31/2020  Medication Sig  . albuterol (PROVENTIL) (2.5 MG/3ML) 0.083% nebulizer solution Take 2.5 mg by nebulization every 6 (six) hours as needed for wheezing or shortness of breath.  Marland Kitchen apixaban (ELIQUIS) 5 MG TABS tablet Take one tablet by mouth twice daily  . cetirizine (ZYRTEC) 10 MG tablet TAKE 1 TABLET BY MOUTH EVERY DAY  . Cholecalciferol (VITAMIN D) 1000 UNITS capsule Take one tablet once daily  . fluticasone (FLONASE) 50 MCG/ACT nasal spray SHAKE LIQUID AND USE 2 SPRAYS IN EACH NOSTRIL DAILY  . Fluticasone-Umeclidin-Vilant (TRELEGY ELLIPTA) 100-62.5-25 MCG/INH AEPB Inhale 1 puff into the lungs daily.  . furosemide (LASIX) 40 MG tablet Take 1 tablet (40 mg total) by mouth daily.  Marland Kitchen gabapentin (NEURONTIN) 300 MG capsule TAKE 1 CAPSULE(300 MG) BY MOUTH TWICE DAILY  . guaiFENesin (MUCINEX) 600 MG 12 hr tablet Take 2 tablets (1,200 mg total) by mouth 2 (two) times daily as needed for cough or to loosen phlegm.  Marland Kitchen levothyroxine (SYNTHROID) 25 MCG tablet TAKE 1 TABLET(25 MCG) BY MOUTH DAILY BEFORE BREAKFAST  . lidocaine (LIDODERM) 5 % Place 1 patch onto the skin daily. Remove & Discard patch within 12 hours or as directed by MD  . metoprolol tartrate (LOPRESSOR) 25 MG tablet TAKE 1 TABLET(25 MG) BY MOUTH TWICE DAILY  . OXYGEN Inhale 4 L into the lungs at bedtime.   . pantoprazole (PROTONIX) 40 MG tablet TAKE 1 TABLET(40 MG) BY MOUTH AT BEDTIME  . potassium chloride SA (K-DUR,KLOR-CON) 10 MEQ tablet Take 1 tablet (10 mEq total) by mouth daily.  . pravastatin (PRAVACHOL) 40 MG tablet TAKE 1 TABLET(40 MG) BY MOUTH DAILY   No facility-administered encounter medications on file as of 12/31/2020.    Review of Systems:  Review of Systems  Constitutional: Negative for chills, fever and malaise/fatigue.  HENT: Positive for congestion. Negative for sore  throat.   Respiratory: Positive for cough, shortness of breath and wheezing. Negative for sputum production.   Cardiovascular: Positive for leg swelling. Negative for chest pain and palpitations.  Gastrointestinal: Negative for abdominal pain.  Genitourinary: Negative for dysuria.  Musculoskeletal: Positive for back pain. Negative for falls.  Neurological: Negative for dizziness and loss of consciousness.  Endo/Heme/Allergies: Bruises/bleeds easily.  Psychiatric/Behavioral: The patient is not nervous/anxious and does not have insomnia.     Health Maintenance  Topic Date Due  . COVID-19 Vaccine (3 - Booster for Pfizer series) 08/07/2020  . COLONOSCOPY (Pts 45-77yrs Insurance coverage will need to be confirmed)  02/20/2021  . TETANUS/TDAP  10/15/2023  . INFLUENZA VACCINE  Completed  .  PNA vac Low Risk Adult  Completed    Physical Exam: Vitals:   12/31/20 1044  BP: 124/82  Pulse: 88  Temp: (!) 97.1 F (36.2 C)  TempSrc: Temporal  SpO2: (!) 84%  Weight: 195 lb 6.4 oz (88.6 kg)  Height: 5\' 6"  (1.676 m)   Body mass index is 31.54 kg/m. Physical Exam Vitals reviewed.  Constitutional:      Appearance: Normal appearance.  HENT:     Head: Normocephalic and atraumatic.  Eyes:     Comments: glasses  Cardiovascular:     Rate and Rhythm: Normal rate and regular rhythm.     Heart sounds: No murmur heard.   Pulmonary:     Breath sounds: Wheezing and rhonchi present. No rales.     Comments: Increased effort which normalized with oxygen Musculoskeletal:        General: Normal range of motion.     Right lower leg: Edema present.     Left lower leg: Edema present.  Neurological:     Mental Status: He is alert.     Gait: Gait abnormal.     Comments: Walks with walker  Psychiatric:        Mood and Affect: Mood normal.     Labs reviewed: Basic Metabolic Panel: Recent Labs    01/27/20 1019 03/11/20 1143 07/10/20 1112 11/02/20 1337  NA  --  140 140 139  K  --  4.7 4.7  4.4  CL  --  103 103 104  CO2  --  30 29 25   GLUCOSE  --  78 81 87  BUN  --  24 21 20   CREATININE  --  1.11 1.45* 1.56*  CALCIUM  --  9.5 9.2 9.2  TSH 16.42* 8.83* 3.36  --    Liver Function Tests: Recent Labs    03/11/20 1143 07/10/20 1112  AST 65* 43*  ALT 47* 40  BILITOT 0.9 0.8  PROT 7.3 7.0   No results for input(s): LIPASE, AMYLASE in the last 8760 hours. No results for input(s): AMMONIA in the last 8760 hours. CBC: Recent Labs    03/11/20 1143 07/10/20 1112 11/02/20 1337  WBC 6.4 6.0 6.0  NEUTROABS 2,854 2,742  --   HGB 15.2 14.5 14.4  HCT 47.3 43.5 46.6  MCV 98.5 94.6 97.7  PLT 197 200 179   Lipid Panel: Recent Labs    03/11/20 1143 07/10/20 1112  CHOL 128 108  HDL 31* 29*  LDLCALC 67 51  TRIG 242* 227*  CHOLHDL 4.1 3.7   Lab Results  Component Value Date   HGBA1C 5.7 (H) 05/16/2016    Procedures since last visit: No results found.  Assessment/Plan 1. Cough with exposure to COVID-19 virus - testing done, but seems he likely has copd exacerbation - POC Influenza A/B was negative - SARS-COV-2 RNA,(COVID-19) QUAL NAAT; Future  2. Chronic diastolic CHF (congestive heart failure) ([facility]) -does not appear hypervolemic perhaps b/c he already took an extra lasix yesterday  3. COPD with acute exacerbation ([facility]) - use neb q6h at home - wear O2 in day until feel better - doxycycline (VIBRA-TABS) 100 MG tablet; Take 1 tablet (100 mg total) by mouth 2 (two) times daily.  Dispense: 20 tablet; Refill: 0 - predniSONE (STERAPRED UNI-PAK 21 TAB) 10 MG (21) TBPK tablet; 6 tabs x 2 days, 5 tabs x 2 days, 4 tabs x 2 days, 3 tabs x 2 days, 2 tabs x 2 days, 1 tab x 2d  Dispense:  42 tablet; Refill: 0  Labs/tests ordered:  covid and flu swabs Next appt:  03/18/2021  Cassiopeia Florentino L. Sumi Lye, D.O. [facility] [address], [place] [address] Cell Phone (Mon-Fri 8am-5pm):  [phone] On Call:  [phone] & follow  prompts after 5pm & weekends Office Phone:  [phone] Office Fax:  [phone]

## 2021-01-02 LAB — SARS-COV-2 RNA,(COVID-19) QUALITATIVE NAAT: SARS CoV2 RNA: DETECTED — AB

## 2021-01-04 NOTE — Telephone Encounter (Signed)
This encounter was created in error - please disregard.

## 2021-01-14 ENCOUNTER — Telehealth: Payer: Self-pay

## 2021-01-14 NOTE — Telephone Encounter (Signed)
Incoming fax received from [facility] stating Albuterol  Nebulizer Solution is not covered by patients plan, we need to initiate a prior authorization of call/fax alternative.  I called [facility] to get the BIN number for medication to initiate PA via covermymeds  BIN [id number]  PA initiated via covermymeds KEY: [id number]  [employer] has received your information, and the request will be reviewed. You [place] close this dialog, return to your dashboard, and perform other tasks.  You will receive an electronic determination in CoverMyMeds. You can see the latest determination by locating this request on your dashboard or by reopening this request. You will also receive a faxed copy of the determination. If you have any questions please contact [employer] at [phone].  If you need assistance, please chat with CoverMyMeds or call us at [phone].  Awaiting Reply

## 2021-01-15 NOTE — Telephone Encounter (Signed)
Incoming fax received from [employer] titled coverage determination request form that asked the same question the representative asked that I recently spoke with and it also asked for sig/directions (which was included on script and PA in covermymeds)  Form completed and faxed back to [employer]

## 2021-01-15 NOTE — Telephone Encounter (Signed)
Message received via a secure chat conversation:  Good Morning! I received a voicemail message for [facility] Stamour 03/26/1932. [employer] called regarding a question about the prior authorization and wants a call. would you please call? He is out of his medication and between his cough and COPD he is having a rough time without the Neb solution. Their Number is [phone] and Reference #: [id number]. Thank You

## 2021-01-15 NOTE — Telephone Encounter (Signed)
I called the provided number, selected 3, then 3 again for the prescriber intake line.   I was asked where patient resides ie, nursing home or facility, in which I responded no. I was placed on hold to speak with the clinical pharmacist. After a total of 11 min hold I had to end call to assist in direct patient care.

## 2021-01-15 NOTE — Telephone Encounter (Signed)
Another message received via secure chat from [place], [place] with [employer] called on Eric [name] 11/13/1932 regarding the Albuterol Neb Sol and stated that she needs a Dx Code. Her Number is [phone]  I called [name] and left a detailed message wit 2 diagnosis codes J44.9 and J96.11

## 2021-01-19 NOTE — Telephone Encounter (Signed)
I spoke with patients daughter in law, Rodena Piety and she stated she picked up medication for her father-in-law and only paid 25 cent for it and she assumed the PA resulted in an approval.

## 2021-01-19 NOTE — Telephone Encounter (Signed)
I checked the status of PA and received the following message via Covermymeds:  This request has been denied. Please note any additional information provided by [employer] at the bottom of your screen. You will also receive a faxed copy of the determination with reasons for the denial.

## 2021-02-01 ENCOUNTER — Encounter: Payer: Self-pay | Admitting: Internal Medicine

## 2021-03-18 ENCOUNTER — Encounter: Payer: Self-pay | Admitting: Nurse Practitioner

## 2021-03-18 ENCOUNTER — Ambulatory Visit (INDEPENDENT_AMBULATORY_CARE_PROVIDER_SITE_OTHER): Payer: PPO | Admitting: Nurse Practitioner

## 2021-03-18 ENCOUNTER — Other Ambulatory Visit: Payer: Self-pay

## 2021-03-18 DIAGNOSIS — Z0000 Encounter for general adult medical examination without abnormal findings: Secondary | ICD-10-CM | POA: Diagnosis not present

## 2021-03-18 NOTE — Progress Notes (Signed)
Subjective:   Eric [name] is a 87 y.o. male who presents for Medicare Annual/Subsequent preventive examination.  Review of Systems     Cardiac Risk Factors include: advanced age (>32men, >38 women);hypertension;dyslipidemia;obesity (BMI >30kg/m2);sedentary lifestyle     Objective:    There were no vitals filed for this visit. There is no height or weight on file to calculate BMI.  Advanced Directives 03/18/2021 12/31/2020 12/28/2020 11/02/2020 03/16/2020 03/11/2020 02/06/2020  Does Patient Have a Medical Advance Directive? Yes - - Yes Yes Yes No  Type of Advance Directive [place];Living will;Out of facility DNR (pink MOST or yellow form) [place];Living will [place];Living will [place];Living will;Out of facility DNR (pink MOST or yellow form) [place];Living will [place] -  Does patient want to make changes to medical advance directive? No - Patient declined No - Patient declined No - Patient declined No - Patient declined No - Patient declined No - Patient declined -  Copy of [place] in Chart? No - copy requested No - copy requested No - copy requested No - copy requested No - copy requested Yes - validated most recent copy scanned in chart (See row information) -  Would patient like information on creating a medical advance directive? - - - - - - No - Patient declined    Current Medications (verified) Outpatient Encounter Medications as of 03/18/2021  Medication Sig  . albuterol (PROVENTIL) (2.5 MG/3ML) 0.083% nebulizer solution Take 2.5 mg by nebulization every 6 (six) hours as needed for wheezing or shortness of breath.  Marland Kitchen apixaban (ELIQUIS) 5 MG TABS tablet Take one tablet by mouth twice daily  . cetirizine (ZYRTEC) 10 MG tablet Take 1 tablet (10 mg total) by mouth daily.  . Cholecalciferol (VITAMIN D) 1000 UNITS capsule Take one tablet once daily   . fluticasone (FLONASE) 50 MCG/ACT nasal spray SHAKE LIQUID AND USE 2 SPRAYS IN EACH NOSTRIL DAILY  . Fluticasone-Umeclidin-Vilant (TRELEGY ELLIPTA) 100-62.5-25 MCG/INH AEPB Inhale 1 puff into the lungs daily.  . furosemide (LASIX) 40 MG tablet Take 1 tablet (40 mg total) by mouth daily.  Marland Kitchen gabapentin (NEURONTIN) 300 MG capsule TAKE 1 CAPSULE(300 MG) BY MOUTH TWICE DAILY  . guaiFENesin (MUCINEX) 600 MG 12 hr tablet Take 2 tablets (1,200 mg total) by mouth 2 (two) times daily as needed for cough or to loosen phlegm.  Marland Kitchen levothyroxine (SYNTHROID) 25 MCG tablet TAKE 1 TABLET(25 MCG) BY MOUTH DAILY BEFORE BREAKFAST  . lidocaine (LIDODERM) 5 % Place 1 patch onto the skin daily. Remove & Discard patch within 12 hours or as directed by MD  . metoprolol tartrate (LOPRESSOR) 25 MG tablet TAKE 1 TABLET(25 MG) BY MOUTH TWICE DAILY  . OXYGEN Inhale 4 L into the lungs at bedtime.   . pantoprazole (PROTONIX) 40 MG tablet TAKE 1 TABLET(40 MG) BY MOUTH AT BEDTIME  . potassium chloride SA (K-DUR,KLOR-CON) 10 MEQ tablet Take 1 tablet (10 mEq total) by mouth daily.  . pravastatin (PRAVACHOL) 40 MG tablet TAKE 1 TABLET(40 MG) BY MOUTH DAILY  . predniSONE (STERAPRED UNI-PAK 21 TAB) 10 MG (21) TBPK tablet 6 tabs x 2 days, 5 tabs x 2 days, 4 tabs x 2 days, 3 tabs x 2 days, 2 tabs x 2 days, 1 tab x 2d  . [DISCONTINUED] doxycycline (VIBRA-TABS) 100 MG tablet Take 1 tablet (100 mg total) by mouth 2 (two) times daily.   No  facility-administered encounter medications on file as of 03/18/2021.    Allergies (verified) Morphine and related, Percocet [oxycodone-acetaminophen], and Valium   History: Past Medical History:  Diagnosis Date  . Anxiety   . Arthritis   . Atypical chest pain    a. 05/2017 MV: no ischemia, EF 79%.  . Chest pain 10/20/2017  . Chronic diastolic CHF (congestive heart failure) ([place])    a. 01/2011 Echo: EF 50-55%, gr1 DD, mild AI, nl RV fxn, mild TR/PR; b. 10/2017 Echo: EF 60-65%, mild LVH, gr2 DD.  Marland Kitchen  DDD (degenerative disc disease), cervical   . Depressive disorder, not elsewhere classified   . Difficult intubation   . Dysphagia, oral phase   . Dyspnea   . Edema   . Gallstones    a. Symptomatic - s/p lap chole 05/2018.  Marland Kitchen History of DVT (deep vein thrombosis)   . History of kidney stones   . Hypertension   . Hypoxemia   . Impacted cerumen   . Ischemic colitis ([place])    a. 02/2018 GIB - colonoscopy w/ isch colitis. Anticoagulation resumed.  . Long term current use of anticoagulant 03/02/2011  . LOW BACK PAIN SYNDROME 03/17/2009   Qualifier: Diagnosis of  By: Council Mechanic MD, Hilaria Ota   . Mixed hyperlipidemia   . 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 Other myelopathy   . Pain in limb   . Palpitations   . Paroxysmal Atrial Fibrillation ([place])    a. a. 01/2011 in setting of post-op complications including aspiration pna;  b. CHA2DS2VASc = 4--> Amio/Eliquis.  . Pneumonia 03/06/2003  . Spinal stenosis, unspecified region other than cervical   . 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 Syncope    a. 10/2017-->Event monitor: RSR, rare PACs/PVCs.  . Thoracic or lumbosacral neuritis or radiculitis, unspecified    Past Surgical History:  Procedure Laterality Date  . CARDIOVERSION N/A 06/20/2017   Procedure: CARDIOVERSION;  Surgeon: [name], MD;  Location: [facility];  Service: Cardiovascular;  Laterality: N/A;  . CATARACT EXTRACTION W/ INTRAOCULAR LENS  IMPLANT, BILATERAL    . CERVICAL FUSION  02/10/2011  . CHOLECYSTECTOMY  05/24/2018  . CHOLECYSTECTOMY N/A 05/24/2018   Procedure: LAPAROSCOPIC CHOLECYSTECTOMY;  Surgeon: Coralie Keens, MD;  Location: [place];  Service: General;  Laterality: N/A;  . COLONOSCOPY WITH PROPOFOL N/A 02/20/2018   Procedure: COLONOSCOPY WITH PROPOFOL;  Surgeon: Lucilla Lame, MD;  Location: [facility];  Service: Endoscopy;  Laterality: N/A;  . history of abd ultrasound  11/01   fatty  liver  . MULTIPLE TOOTH EXTRACTIONS    . ORIF FIBULA FRACTURE Left 01/06/2017   Procedure: OPEN REDUCTION INTERNAL FIXATION (ORIF) FIBULA FRACTURE DISTAL FIBULA;  Surgeon: Melrose Nakayama, MD;  Location: [place];  Service: Orthopedics;  Laterality: Left;  Patient states has problems if he will have a tube in throat for Genera; Anesthesia   Family History  Problem Relation Age of Onset  . Stroke Father   . Atrial fibrillation Brother        on coumadin  . Heart disease Brother        AFib- coumadin  . Stroke Paternal Grandfather   . Other Mother        hemorrhage  . Cancer Other        colon cancer at early age  . Prostate cancer Neg Hx   . Kidney cancer Neg Hx   .  Bladder Cancer Neg Hx    Social History   Socioeconomic History  . Marital status: Married    Spouse name: Not on file  . Number of children: 2  . Years of education: 68  . Highest education level: High school graduate  Occupational History  . 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 Smoking status: Former Smoker    Years: 1.00    Types: Cigarettes  . Smokeless tobacco: Never Used  . Tobacco comment: stopped in 20's  Vaping Use  . Vaping Use: Never used  Substance and Sexual Activity  . Alcohol use: No  . Drug use: No  . Sexual activity: Not Currently  Other Topics Concern  . Not on file  Social History Narrative   Lives at home with his wife.   Left-handed (due to arthritis, he uses his right hand more now).   Caffeine use: 2 cups per day.   Social Determinants of Health   Financial Resource Strain: Not on file  Food Insecurity: Not on file  Transportation Needs: Not on file  Physical Activity: Not on file  Stress: Not on file  Social Connections: Not on file    Tobacco Counseling Counseling given: Not Answered Comment: stopped in 20's   Clinical Intake:  Pre-visit preparation completed: Yes  Pain : No/denies pain      BMI - recorded: 31 Nutritional Risks: None Diabetes: No  How often do you need to have someone help you when you read instructions, pamphlets, or other written materials from your doctor or pharmacy?: 1 - Never  Diabetic?no         Activities of Daily Living In your present state of health, do you have any difficulty performing the following activities: 03/18/2021  Hearing? N  Vision? N  Difficulty concentrating or making decisions? N  Walking or climbing stairs? N  Dressing or bathing? N  Doing errands, shopping? Y  Comment does not Physiological scientist and eating ? Y  Comment meals on wheels  Using the Toilet? N  In the past six months, have you accidently leaked urine? N  Do you have problems with loss of bowel control? N  Managing your Medications? N  Managing your Finances? N  Housekeeping or managing your Housekeeping? N  Some recent data might be hidden    Patient Care Team: Lauree Chandler, NP as PCP - General (Geriatric Medicine) [name], MD as PCP - Cardiology (Cardiology) [name], MD as Consulting Physician (Cardiology) Normajean Glasgow, MD as Attending Physician (Physical Medicine and Rehabilitation) Phylliss Bob, MD as Consulting Physician (Orthopedic Surgery) Wilhelmina Mcardle, MD (Inactive) as Consulting Physician (Pulmonary Disease)  Indicate any recent Medical Services you [place] have received from other than [facility] providers in the past year (date Seehafer be approximate).     Assessment:   This is a routine wellness examination for Eric [name].  Hearing/Vision screen  Hearing Screening   125Hz  250Hz  500Hz  1000Hz  2000Hz  3000Hz  4000Hz  6000Hz  8000Hz   Right ear:           Left ear:           Comments: Patient has no hearing problems  Vision Screening Comments: Patient has no vision problems. Patient had eye exam November 2021. Patient sees Dr. Gloriann Loan  Dietary issues and exercise activities discussed: Current Exercise Habits: Home exercise  routine, Type of exercise: calisthenics, Time (Minutes): 10,  Frequency (Times/Week): 7, Weekly Exercise (Minutes/Week): 70  Goals    . pt stated     To walk better      Depression Screen PHQ 2/9 Scores 03/18/2021 12/31/2020 12/28/2020 07/10/2020 03/16/2020 05/27/2019 05/01/2019  PHQ - 2 Score 0 0 0 0 0 0 0  Exception Documentation - - - - - - -    Fall Risk Fall Risk  03/18/2021 12/31/2020 12/28/2020 11/02/2020 07/10/2020  Falls in the past year? 0 0 0 0 0  Number falls in past yr: 0 0 0 0 0  Comment - - - - -  Injury with Fall? 0 0 0 0 0    FALL RISK PREVENTION PERTAINING TO THE HOME:  Any stairs in or around the home? Yes  If so, are there any without handrails? No  Home free of loose throw rugs in walkways, pet beds, electrical cords, etc? Yes  Adequate lighting in your home to reduce risk of falls? Yes   ASSISTIVE DEVICES UTILIZED TO PREVENT FALLS:  Life alert? No  Use of a cane, walker or w/c? Yes  Grab bars in the bathroom? No  Shower chair or bench in shower? Yes  Elevated toilet seat or a handicapped toilet? No   TIMED UP AND GO:  Was the test performed? No .   Cognitive Function: MMSE - Mini Mental State Exam 03/09/2018 05/16/2016  Orientation to time 5 5  Orientation to Place 5 5  Registration 3 3  Attention/ Calculation 5 5  Recall 2 3  Language- name 2 objects 2 2  Language- repeat 1 1  Language- follow 3 step command 3 3  Language- read & follow direction 1 1  Write a sentence 1 1  Copy design 0 1  Total score 28 30     6CIT Screen 03/18/2021 03/16/2020 03/15/2019  What Year? 0 points 0 points 0 points  What month? 0 points 0 points 0 points  What time? 0 points 0 points 0 points  Count back from 20 0 points 0 points 2 points  Months in reverse 0 points 0 points 0 points  Repeat phrase 8 points 6 points 4 points  Total Score 8 6 6     Immunizations Immunization History  Administered Date(s) Administered  . Fluad Quad(high Dose 65+) 10/11/2019, 11/02/2020  .  Influenza Whole 09/11/1997, 10/11/2007, 10/06/2010  . Influenza, High Dose Seasonal PF 09/01/2017, 08/30/2018  . Influenza,inj,Quad PF,6+ Mos 10/03/2013, 09/15/2014, 09/04/2015  . PFIZER(Purple Top)SARS-COV-2 Vaccination 01/18/2020, 02/08/2020  . Pneumococcal Conjugate-13 01/19/2015  . Pneumococcal Polysaccharide-23 03/05/2002  . Td 01/19/2003  . Tdap 10/14/2013  . Zoster 10/14/2013    TDAP status: Up to date  Flu Vaccine status: Up to date  Pneumococcal vaccine status: Up to date  Covid-19 vaccine status: Completed vaccines  Qualifies for Shingles Vaccine? Yes   Zostavax completed Yes   Shingrix Completed?: No.    Education has been provided regarding the importance of this vaccine. Patient has been advised to call insurance company to determine out of pocket expense if they have not yet received this vaccine. Advised Mcdonald also receive vaccine at local pharmacy or Health Dept. Verbalized acceptance and understanding.  Screening Tests Health Maintenance  Topic Date Due  . COVID-19 Vaccine (3 - Booster for Pfizer series) 08/07/2020  . COLONOSCOPY (Pts 45-45yrs Insurance coverage will need to be confirmed)  02/20/2021  . INFLUENZA VACCINE  07/12/2021  . TETANUS/TDAP  10/15/2023  . PNA vac Low Risk Adult  Completed  .  HPV VACCINES  Aged Out    Health Maintenance  Health Maintenance Due  Topic Date Due  . COVID-19 Vaccine (3 - Booster for Pfizer series) 08/07/2020  . COLONOSCOPY (Pts 45-29yrs Insurance coverage will need to be confirmed)  02/20/2021    Colorectal cancer screening: No longer required.   Lung Cancer Screening: (Low Dose CT Chest recommended if Age 42-80 years, 30 pack-year currently smoking OR have quit w/in 15years.) does not qualify.    Additional Screening:  Hepatitis C Screening: does not qualify;   Vision Screening: Recommended annual ophthalmology exams for early detection of glaucoma and other disorders of the eye. Is the patient up to date with  their annual eye exam?  Yes  Who is the provider or what is the name of the office in which the patient attends annual eye exams? Dr Lysle Morales If pt is not established with a provider, would they like to be referred to a provider to establish care? No .   Dental Screening: Recommended annual dental exams for proper oral hygiene  Community Resource Referral / Chronic Care Management: CRR required this visit?  No   CCM required this visit?  No      Plan:     I have personally reviewed and noted the following in the patient's chart:   . Medical and social history . Use of alcohol, tobacco or illicit drugs  . Current medications and supplements . Functional ability and status . Nutritional status . Physical activity . Advanced directives . List of other physicians . Hospitalizations, surgeries, and ER visits in previous 12 months . Vitals . Screenings to include cognitive, depression, and falls . Referrals and appointments  In addition, I have reviewed and discussed with patient certain preventive protocols, quality metrics, and best practice recommendations. A written personalized care plan for preventive services as well as general preventive health recommendations were provided to patient.     Lauree Chandler, NP   03/18/2021    Virtual Visit via Telephone Note  I connected with on 03/18/21 at 10:00 AM EDT by telephone and verified that I am speaking with the correct person using two identifiers.  Location: Patient: home Provider: [place]   I discussed the limitations, risks, security and privacy concerns of performing an evaluation and management service by telephone and the availability of in person appointments. I also discussed with the patient that there [place] be a patient responsible charge related to this service. The patient expressed understanding and agreed to proceed.   I discussed the assessment and treatment plan with the patient. The patient was provided an  opportunity to ask questions and all were answered. The patient agreed with the plan and demonstrated an understanding of the instructions.   The patient was advised to call back or seek an in-person evaluation if the symptoms worsen or if the condition fails to improve as anticipated.  I provided 15 minutes of non-face-to-face time during this encounter.  Carlos American. Harle Battiest Avs printed and mailed

## 2021-03-18 NOTE — Progress Notes (Signed)
This service is provided via telemedicine  No vital signs collected/recorded due to the encounter was a telemedicine visit.   Location of patient (ex: home, work): [place]  Patient consents to a telephone visit:  Yes, see encounter dated 03/16/2020  Location of the provider (ex: office, home):  [place]  Name of any referring provider: N/A  Names of all persons participating in the telemedicine service and their role in the encounter: Sherrie Mustache, Nurse Practitioner, Carroll Kinds, CMA, and patient.   Time spent on call:  14 minutes with medical assistant

## 2021-03-18 NOTE — Patient Instructions (Signed)
Eric [name] , Thank you for taking time to come for your Medicare Wellness Visit. I appreciate your ongoing commitment to your health goals. Please review the following plan we discussed and let me know if I can assist you in the future.   Screening recommendations/referrals: Colonoscopy aged out Recommended yearly ophthalmology/optometry visit for glaucoma screening and checkup Recommended yearly dental visit for hygiene and checkup  Vaccinations: Influenza vaccine up to date Pneumococcal vaccine up to date Tdap vaccine up to date Shingles vaccine RECOMMENDED- to get at your local pharmacy     Advanced directives: recommended to bring copy to office to place on file.   Conditions/risks identified: advanced age, heart disease, obesity, sedentary lifestyle  Next appointment: 1 year for AWV  Preventive Care 85 Years and Older, Male Preventive care refers to lifestyle choices and visits with your health care provider that can promote health and wellness. What does preventive care include?  A yearly physical exam. This is also called an annual well check.  Dental exams once or twice a year.  Routine eye exams. Ask your health care provider how often you should have your eyes checked.  Personal lifestyle choices, including:  Daily care of your teeth and gums.  Regular physical activity.  Eating a healthy diet.  Avoiding tobacco and drug use.  Limiting alcohol use.  Practicing safe sex.  Taking low doses of aspirin every day.  Taking vitamin and mineral supplements as recommended by your health care provider. What happens during an annual well check? The services and screenings done by your health care provider during your annual well check will depend on your age, overall health, lifestyle risk factors, and family history of disease. Counseling  Your health care provider Griner ask you questions about your:  Alcohol use.  Tobacco use.  Drug use.  Emotional  well-being.  Home and relationship well-being.  Sexual activity.  Eating habits.  History of falls.  Memory and ability to understand (cognition).  Work and work Statistician. Screening  You Frary have the following tests or measurements:  Height, weight, and BMI.  Blood pressure.  Lipid and cholesterol levels. These Eschbach be checked every 5 years, or more frequently if you are over 85 years old.  Skin check.  Lung cancer screening. You Sides have this screening every year starting at age 85 if you have a 30-pack-year history of smoking and currently smoke or have quit within the past 15 years.  Fecal occult blood test (FOBT) of the stool. You Jansma have this test every year starting at age 85.  Flexible sigmoidoscopy or colonoscopy. You Sozio have a sigmoidoscopy every 5 years or a colonoscopy every 10 years starting at age 85.  Prostate cancer screening. Recommendations will vary depending on your family history and other risks.  Hepatitis C blood test.  Hepatitis B blood test.  Sexually transmitted disease (STD) testing.  Diabetes screening. This is done by checking your blood sugar (glucose) after you have not eaten for a while (fasting). You Casimir have this done every 1-3 years.  Abdominal aortic aneurysm (AAA) screening. You Dimare need this if you are a current or former smoker.  Osteoporosis. You Quang be screened starting at age 20 if you are at high risk. Talk with your health care provider about your test results, treatment options, and if necessary, the need for more tests. Vaccines  Your health care provider Lacasse recommend certain vaccines, such as:  Influenza vaccine. This is recommended every year.  Tetanus, diphtheria,  and acellular pertussis (Tdap, Td) vaccine. You Harsha need a Td booster every 10 years.  Zoster vaccine. You Colborn need this after age 50.  Pneumococcal 13-valent conjugate (PCV13) vaccine. One dose is recommended after age 85.  Pneumococcal  polysaccharide (PPSV23) vaccine. One dose is recommended after age 85. Talk to your health care provider about which screenings and vaccines you need and how often you need them. This information is not intended to replace advice given to you by your health care provider. Make sure you discuss any questions you have with your health care provider. Document Released: 12/25/2015 Document Revised: 08/17/2016 Document Reviewed: 09/29/2015 Elsevier Interactive Patient Education  2017 [place] Prevention in the Home Falls can cause injuries. They can happen to people of all ages. There are many things you can do to make your home safe and to help prevent falls. What can I do on the outside of my home?  Regularly fix the edges of walkways and driveways and fix any cracks.  Remove anything that might make you trip as you walk through a door, such as a raised step or threshold.  Trim any bushes or trees on the path to your home.  Use bright outdoor lighting.  Clear any walking paths of anything that might make someone trip, such as rocks or tools.  Regularly check to see if handrails are loose or broken. Make sure that both sides of any steps have handrails.  Any raised decks and porches should have guardrails on the edges.  Have any leaves, snow, or ice cleared regularly.  Use sand or salt on walking paths during winter.  Clean up any spills in your garage right away. This includes oil or grease spills. What can I do in the bathroom?  Use night lights.  Install grab bars by the toilet and in the tub and shower. Do not use towel bars as grab bars.  Use non-skid mats or decals in the tub or shower.  If you need to sit down in the shower, use a plastic, non-slip stool.  Keep the floor dry. Clean up any water that spills on the floor as soon as it happens.  Remove soap buildup in the tub or shower regularly.  Attach bath mats securely with double-sided non-slip rug  tape.  Do not have throw rugs and other things on the floor that can make you trip. What can I do in the bedroom?  Use night lights.  Make sure that you have a light by your bed that is easy to reach.  Do not use any sheets or blankets that are too big for your bed. They should not hang down onto the floor.  Have a firm chair that has side arms. You can use this for support while you get dressed.  Do not have throw rugs and other things on the floor that can make you trip. What can I do in the kitchen?  Clean up any spills right away.  Avoid walking on wet floors.  Keep items that you use a lot in easy-to-reach places.  If you need to reach something above you, use a strong step stool that has a grab bar.  Keep electrical cords out of the way.  Do not use floor polish or wax that makes floors slippery. If you must use wax, use non-skid floor wax.  Do not have throw rugs and other things on the floor that can make you trip. What can I do with my  stairs?  Do not leave any items on the stairs.  Make sure that there are handrails on both sides of the stairs and use them. Fix handrails that are broken or loose. Make sure that handrails are as long as the stairways.  Check any carpeting to make sure that it is firmly attached to the stairs. Fix any carpet that is loose or worn.  Avoid having throw rugs at the top or bottom of the stairs. If you do have throw rugs, attach them to the floor with carpet tape.  Make sure that you have a light switch at the top of the stairs and the bottom of the stairs. If you do not have them, ask someone to add them for you. What else can I do to help prevent falls?  Wear shoes that:  Do not have high heels.  Have rubber bottoms.  Are comfortable and fit you well.  Are closed at the toe. Do not wear sandals.  If you use a stepladder:  Make sure that it is fully opened. Do not climb a closed stepladder.  Make sure that both sides of the  stepladder are locked into place.  Ask someone to hold it for you, if possible.  Clearly mark and make sure that you can see:  Any grab bars or handrails.  First and last steps.  Where the edge of each step is.  Use tools that help you move around (mobility aids) if they are needed. These include:  Canes.  Walkers.  Scooters.  Crutches.  Turn on the lights when you go into a dark area. Replace any light bulbs as soon as they burn out.  Set up your furniture so you have a clear path. Avoid moving your furniture around.  If any of your floors are uneven, fix them.  If there are any pets around you, be aware of where they are.  Review your medicines with your doctor. Some medicines can make you feel dizzy. This can increase your chance of falling. Ask your doctor what other things that you can do to help prevent falls. This information is not intended to replace advice given to you by your health care provider. Make sure you discuss any questions you have with your health care provider. Document Released: 09/24/2009 Document Revised: 05/05/2016 Document Reviewed: 01/02/2015 Elsevier Interactive Patient Education  2017 [employer].

## 2021-04-17 ENCOUNTER — Other Ambulatory Visit: Payer: Self-pay | Admitting: Pulmonary Disease

## 2021-04-21 ENCOUNTER — Other Ambulatory Visit: Payer: Self-pay

## 2021-04-21 ENCOUNTER — Telehealth (INDEPENDENT_AMBULATORY_CARE_PROVIDER_SITE_OTHER): Payer: PPO | Admitting: Nurse Practitioner

## 2021-04-21 DIAGNOSIS — W5503XA Scratched by cat, initial encounter: Secondary | ICD-10-CM | POA: Diagnosis not present

## 2021-04-21 DIAGNOSIS — L539 Erythematous condition, unspecified: Secondary | ICD-10-CM | POA: Diagnosis not present

## 2021-04-21 MED ORDER — AZITHROMYCIN 250 MG PO TABS: ORAL_TABLET | ORAL | 0 refills | Status: AC

## 2021-04-21 NOTE — Progress Notes (Signed)
This service is provided via telemedicine  No vital signs collected/recorded due to the encounter was a telemedicine visit.   Location of patient (ex: home, work):  Home  Patient consents to a telephone visit: Yes, see encounter dated 03/16/2020  Location of the provider (ex: office, home):  [facility]  Name of any referring provider:  N/A  Names of all persons participating in the telemedicine service and their role in the encounter:  Sherrie Mustache, Nurse Practitioner, Carroll Kinds, CMA, and patient.   Time spent on call: 8 minutes with medical assistant

## 2021-04-21 NOTE — Patient Instructions (Signed)
To take azithromycin as prescribed  To do epsom salt soaks 3 times daily  To notify/seek medical treatment if foot gets worse on antibiotic, (swelling, redness or pain worsens)

## 2021-04-21 NOTE — Progress Notes (Addendum)
Careteam: Patient Care Team: Lauree Chandler, NP as PCP - General (Geriatric Medicine) [name], MD as PCP - Cardiology (Cardiology) [name], MD as Consulting Physician (Cardiology) Normajean Glasgow, MD as Attending Physician (Physical Medicine and Rehabilitation) Phylliss Bob, MD as Consulting Physician (Orthopedic Surgery) Wilhelmina Mcardle, MD (Inactive) as Consulting Physician (Pulmonary Disease)  Advanced Directive information    Allergies  Allergen Reactions  . Morphine And Related Shortness Of Breath  . Percocet [Oxycodone-Acetaminophen] Shortness Of Breath  . Valium Shortness Of Breath    Chief Complaint  Patient presents with  . Acute Visit    Cat scratch left foot. Patient states that area is burning and stinging. No bleeding from area. Patient toenail is missing on one of his toes from cat. Patient used peroxide on area.      HPI: Patient is a 87 y.o. male due to cat scratch of the left foot last night.  Reports his cat scratch his ankle and took a toe nail off. He just had his toenails clips. He will grab at their ankles. When it happened the area bled a lot.   Reports foot is somewhat swollen and burns and stings.  Reports it is a little red.  Cleaned area with peroxide.  No fever.   Review of Systems:  Review of Systems  Constitutional: Negative for chills, fever and malaise/fatigue.  Respiratory: Negative for cough.   Cardiovascular: Negative for chest pain, palpitations and leg swelling.  Skin: Negative for itching and rash.    Past Medical History:  Diagnosis Date  . Anxiety   . Arthritis   . Atypical chest pain    a. 05/2017 MV: no ischemia, EF 79%.  . Chest pain 10/20/2017  . Chronic diastolic CHF (congestive heart failure) ([place])    a. 01/2011 Echo: EF 50-55%, gr1 DD, mild AI, nl RV fxn, mild TR/PR; b. 10/2017 Echo: EF 60-65%, mild LVH, gr2 DD.  Marland Kitchen DDD (degenerative disc disease), cervical   . Depressive disorder, not elsewhere  classified   . Difficult intubation   . Dysphagia, oral phase   . Dyspnea   . Edema   . Gallstones    a. Symptomatic - s/p lap chole 05/2018.  Marland Kitchen History of DVT (deep vein thrombosis)   . History of kidney stones   . Hypertension   . Hypoxemia   . Impacted cerumen   . Ischemic colitis ([place])    a. 02/2018 GIB - colonoscopy w/ isch colitis. Anticoagulation resumed.  . Long term current use of anticoagulant 03/02/2011  . LOW BACK PAIN SYNDROME 03/17/2009   Qualifier: Diagnosis of  By: Council Mechanic MD, Hilaria Ota   . Mixed hyperlipidemia   . 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 Other myelopathy   . Pain in limb   . Palpitations   . Paroxysmal Atrial Fibrillation ([place])    a. a. 01/2011 in setting of post-op complications including aspiration pna;  b. CHA2DS2VASc = 4--> Amio/Eliquis.  . Pneumonia 03/06/2003  . Spinal stenosis, unspecified region other than cervical   . 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 Syncope    a. 10/2017-->Event monitor: RSR, rare PACs/PVCs.  . Thoracic or lumbosacral neuritis or radiculitis, unspecified    Past Surgical History:  Procedure Laterality Date  . CARDIOVERSION N/A 06/20/2017   Procedure: CARDIOVERSION;  Surgeon: [name], MD;  Location: [facility] ORS;  Service: Cardiovascular;  Laterality: N/A;  . CATARACT EXTRACTION W/ INTRAOCULAR LENS  IMPLANT, BILATERAL    . CERVICAL FUSION  02/10/2011  . CHOLECYSTECTOMY  05/24/2018  . CHOLECYSTECTOMY N/A 05/24/2018   Procedure: LAPAROSCOPIC CHOLECYSTECTOMY;  Surgeon: Coralie Keens, MD;  Location: [place];  Service: General;  Laterality: N/A;  . COLONOSCOPY WITH PROPOFOL N/A 02/20/2018   Procedure: COLONOSCOPY WITH PROPOFOL;  Surgeon: Lucilla Lame, MD;  Location: [facility] ENDOSCOPY;  Service: Endoscopy;  Laterality: N/A;  . history of abd ultrasound  11/01   fatty liver  . MULTIPLE TOOTH EXTRACTIONS    . ORIF FIBULA FRACTURE Left 01/06/2017    Procedure: OPEN REDUCTION INTERNAL FIXATION (ORIF) FIBULA FRACTURE DISTAL FIBULA;  Surgeon: Melrose Nakayama, MD;  Location: [place];  Service: Orthopedics;  Laterality: Left;  Patient states has problems if he will have a tube in throat for Genera; Anesthesia   Social History:   reports that he has quit smoking. His smoking use included cigarettes. He quit after 1.00 year of use. He has never used smokeless tobacco. He reports that he does not drink alcohol and does not use drugs.  Family History  Problem Relation Age of Onset  . Stroke Father   . Atrial fibrillation Brother        on coumadin  . Heart disease Brother        AFib- coumadin  . Stroke Paternal Grandfather   . Other Mother        hemorrhage  . Cancer Other        colon cancer at early age  . Prostate cancer Neg Hx   . Kidney cancer Neg Hx   . Bladder Cancer Neg Hx     Medications: Patient's Medications  New Prescriptions   No medications on file  Previous Medications   ALBUTEROL (PROVENTIL) (2.5 MG/3ML) 0.083% NEBULIZER SOLUTION    Take 2.5 mg by nebulization every 6 (six) hours as needed for wheezing or shortness of breath.   APIXABAN (ELIQUIS) 5 MG TABS TABLET    Take one tablet by mouth twice daily   CETIRIZINE (ZYRTEC) 10 MG TABLET    Take 1 tablet (10 mg total) by mouth daily.   CHOLECALCIFEROL (VITAMIN D) 1000 UNITS CAPSULE    Take one tablet once daily   FLUTICASONE (FLONASE) 50 MCG/ACT NASAL SPRAY    SHAKE LIQUID AND USE 2 SPRAYS IN EACH NOSTRIL DAILY   FUROSEMIDE (LASIX) 40 MG TABLET    Take 1 tablet (40 mg total) by mouth daily.   GABAPENTIN (NEURONTIN) 300 MG CAPSULE    TAKE 1 CAPSULE(300 MG) BY MOUTH TWICE DAILY   GUAIFENESIN (MUCINEX) 600 MG 12 HR TABLET    Take 2 tablets (1,200 mg total) by mouth 2 (two) times daily as needed for cough or to loosen phlegm.   LEVOTHYROXINE (SYNTHROID) 25 MCG TABLET    TAKE 1 TABLET(25 MCG) BY MOUTH DAILY BEFORE BREAKFAST   LIDOCAINE (LIDODERM) 5 %    Place 1 patch onto the  skin daily. Remove & Discard patch within 12 hours or as directed by MD   METOPROLOL TARTRATE (LOPRESSOR) 25 MG TABLET    TAKE 1 TABLET(25 MG) BY MOUTH TWICE DAILY   OXYGEN    Inhale 4 L into the lungs at bedtime.    PANTOPRAZOLE (PROTONIX) 40 MG TABLET    TAKE 1 TABLET(40 MG) BY MOUTH AT BEDTIME   POTASSIUM CHLORIDE SA (K-DUR,KLOR-CON) 10 MEQ TABLET    Take 1 tablet (10 mEq total) by  mouth daily.   PRAVASTATIN (PRAVACHOL) 40 MG TABLET    TAKE 1 TABLET(40 MG) BY MOUTH DAILY   PREDNISONE (STERAPRED UNI-PAK 21 TAB) 10 MG (21) TBPK TABLET    6 tabs x 2 days, 5 tabs x 2 days, 4 tabs x 2 days, 3 tabs x 2 days, 2 tabs x 2 days, 1 tab x 2d   TRELEGY ELLIPTA 100-62.5-25 MCG/INH AEPB    INHALE 1 PUFF INTO THE LUNGS DAILY  Modified Medications   No medications on file  Discontinued Medications   No medications on file    Physical Exam:  There were no vitals filed for this visit. There is no height or weight on file to calculate BMI. Wt Readings from Last 3 Encounters:  12/31/20 195 lb 6.4 oz (88.6 kg)  12/22/20 195 lb 6.4 oz (88.6 kg)  11/02/20 198 lb (89.8 kg)      Labs reviewed: Basic Metabolic Panel: Recent Labs    07/10/20 1112 11/02/20 1337  NA 140 139  K 4.7 4.4  CL 103 104  CO2 29 25  GLUCOSE 81 87  BUN 21 20  CREATININE 1.45* 1.56*  CALCIUM 9.2 9.2  TSH 3.36  --    Liver Function Tests: Recent Labs    07/10/20 1112  AST 43*  ALT 40  BILITOT 0.8  PROT 7.0   No results for input(s): LIPASE, AMYLASE in the last 8760 hours. No results for input(s): AMMONIA in the last 8760 hours. CBC: Recent Labs    07/10/20 1112 11/02/20 1337  WBC 6.0 6.0  NEUTROABS 2,742  --   HGB 14.5 14.4  HCT 43.5 46.6  MCV 94.6 97.7  PLT 200 179   Lipid Panel: Recent Labs    07/10/20 1112  CHOL 108  HDL 29*  LDLCALC 51  TRIG 227*  CHOLHDL 3.7   TSH: Recent Labs    07/10/20 1112  TSH 3.36   A1C: Lab Results  Component Value Date   HGBA1C 5.7 (H) 05/16/2016      Assessment/Plan 1. Cat scratch with Erythema -has thoroughly cleaned area at this time -to use warm epsom salt soak 3 times daily  - at risk for infection azithromycin (ZITHROMAX) 250 MG tablet; Take 2 tablets on day 1, then 1 tablet daily on days 2 through 5  Dispense: 6 tablet; Refill: 0 -strict follow up precautions discussed.    Carlos American. Harle Battiest  [facility] [phone]    Virtual Visit via telephone  I connected with patient on 04/21/21 at  3:15 PM EDT by telephone and verified that I am speaking with the correct person using two identifiers.  Location: Patient: home Provider: psc   I discussed the limitations, risks, security and privacy concerns of performing an evaluation and management service by telephone and the availability of in person appointments. I also discussed with the patient that there Mitchner be a patient responsible charge related to this service. The patient expressed understanding and agreed to proceed.   I discussed the assessment and treatment plan with the patient. The patient was provided an opportunity to ask questions and all were answered. The patient agreed with the plan and demonstrated an understanding of the instructions.   The patient was advised to call back or seek an in-person evaluation if the symptoms worsen or if the condition fails to improve as anticipated.  I provided 15 minutes of non-face-to-face time during this encounter.  Carlos American. Dewaine Oats, [place] printed  and mailed

## 2021-05-09 ENCOUNTER — Other Ambulatory Visit: Payer: Self-pay | Admitting: Nurse Practitioner

## 2021-05-09 DIAGNOSIS — I480 Paroxysmal atrial fibrillation: Secondary | ICD-10-CM

## 2021-05-19 ENCOUNTER — Other Ambulatory Visit: Payer: Self-pay | Admitting: Pulmonary Disease

## 2021-06-01 ENCOUNTER — Telehealth: Payer: Self-pay | Admitting: Pulmonary Disease

## 2021-06-01 MED ORDER — TRELEGY ELLIPTA 100-62.5-25 MCG/INH IN AEPB: 1.0000 | INHALATION_SPRAY | Freq: Every day | RESPIRATORY_TRACT | 0 refills | Status: DC

## 2021-06-01 NOTE — Telephone Encounter (Signed)
Called and spoke with Patient's daughter Jenny Reichmann ([place]).  Jenny Reichmann stated Patient was currently in the doughnut hole and was requesting Trelegy 100 sample.  Trelegy sample placed at front desk for [facility] to pick up this week. Nothing further at this time.

## 2021-06-05 ENCOUNTER — Other Ambulatory Visit: Payer: Self-pay | Admitting: Nurse Practitioner

## 2021-06-05 DIAGNOSIS — M5442 Lumbago with sciatica, left side: Secondary | ICD-10-CM

## 2021-06-21 ENCOUNTER — Other Ambulatory Visit: Payer: Self-pay

## 2021-06-21 MED ORDER — APIXABAN 5 MG PO TABS
ORAL_TABLET | ORAL | 2 refills | Status: DC
Start: 2021-06-21 — End: 2021-09-03

## 2021-06-22 ENCOUNTER — Telehealth: Payer: Self-pay | Admitting: Pulmonary Disease

## 2021-06-22 NOTE — Telephone Encounter (Signed)
Left detailed messaged informing patient daughter we cannot supply trelegy samples for the remainder of the year or throughout his doughnut hole. I made her aware I would placing a patient assistance application up front for pick up. I also stated that because he has a month's supply we will re-visit the need for samples at the end of the 28 days.   Nothing further needed at this time.

## 2021-06-22 NOTE — Telephone Encounter (Signed)
Pts daughter Jenny Reichmann is calling because she stated that they only received two boxes of Trelegy that is 28 days worth and states her father is going to need more samples and was wanting to pick them up. 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

## 2021-06-29 ENCOUNTER — Telehealth: Payer: PPO

## 2021-06-29 DIAGNOSIS — E039 Hypothyroidism, unspecified: Secondary | ICD-10-CM

## 2021-06-29 MED ORDER — LEVOTHYROXINE SODIUM 25 MCG PO TABS: ORAL_TABLET | ORAL | 1 refills | Status: DC

## 2021-06-29 NOTE — Telephone Encounter (Signed)
Refill request received from pharmacy °

## 2021-06-30 ENCOUNTER — Other Ambulatory Visit: Payer: Self-pay

## 2021-06-30 ENCOUNTER — Other Ambulatory Visit: Payer: Self-pay | Admitting: Family

## 2021-06-30 DIAGNOSIS — R7989 Other specified abnormal findings of blood chemistry: Secondary | ICD-10-CM

## 2021-06-30 DIAGNOSIS — E782 Mixed hyperlipidemia: Secondary | ICD-10-CM

## 2021-06-30 MED ORDER — PRAVASTATIN SODIUM 40 MG PO TABS: ORAL_TABLET | ORAL | 0 refills | Status: DC

## 2021-06-30 NOTE — Telephone Encounter (Signed)
Patient has request refill on medication "Pravastatin 40mg ". I refilled patient medication for 2 months. Patient needs to schedule appointment for Lab work. Last Lipid Panel was 07/10/2020. Please place all lab orders that patient needs. I will call and schedule patient to come into office. Message routed to Marlowe Sax, NP due to PCP Dewaine Oats Carlos American, NP being out of office.

## 2021-07-01 NOTE — Telephone Encounter (Signed)
I called patients daughter Jenny Reichmann and she was on her way out of town. Jenny Reichmann was informed that Mr.Bomkamp is due for fasting labs.   Cindy plans to call back to schedule fasting lab appointment when she returns

## 2021-07-27 ENCOUNTER — Encounter: Payer: Self-pay | Admitting: Pulmonary Disease

## 2021-07-27 ENCOUNTER — Ambulatory Visit: Payer: PPO | Admitting: Pulmonary Disease

## 2021-07-27 ENCOUNTER — Ambulatory Visit (INDEPENDENT_AMBULATORY_CARE_PROVIDER_SITE_OTHER): Payer: PPO

## 2021-07-27 ENCOUNTER — Other Ambulatory Visit: Payer: Self-pay

## 2021-07-27 VITALS — BP 126/72 | HR 55 | Ht 66.0 in | Wt 173.8 lb

## 2021-07-27 DIAGNOSIS — R059 Cough, unspecified: Secondary | ICD-10-CM | POA: Diagnosis not present

## 2021-07-27 DIAGNOSIS — R0683 Snoring: Secondary | ICD-10-CM

## 2021-07-27 DIAGNOSIS — J9611 Chronic respiratory failure with hypoxia: Secondary | ICD-10-CM

## 2021-07-27 DIAGNOSIS — Z8616 Personal history of COVID-19: Secondary | ICD-10-CM | POA: Diagnosis not present

## 2021-07-27 DIAGNOSIS — J449 Chronic obstructive pulmonary disease, unspecified: Secondary | ICD-10-CM

## 2021-07-27 MED ORDER — TRELEGY ELLIPTA 100-62.5-25 MCG/INH IN AEPB: 1.0000 | INHALATION_SPRAY | Freq: Every day | RESPIRATORY_TRACT | 0 refills | Status: DC

## 2021-07-27 NOTE — Progress Notes (Signed)
[facility]  Chief Complaint  Patient presents with   Follow-up    6 mo for COPD. Still using 4L of O2 at night. Had covid back in January 2022 and has developed a dry cough. Denies any increased wheezing or chest tightness.     Constitutional:  BP 126/72   Pulse (!) 55   Ht '5\' 6"'$  (1.676 m)   Wt 173 lb 12.8 oz (78.8 kg)   SpO2 95% Comment: on RA  BMI 28.05 kg/m   Past Medical History:  Anxiety, OA, Chronic diastolic CHF, Depression, Dysphagia, DVT, Nephrolithiasis, HTN, Ischemic colitis, Back pain, HLD, PAF, PNA 2004, Spinal stenosis, COVID 30 December 2020  Past Surgical History:  He  has a past surgical history that includes history of abd ultrasound (11/01); Cervical fusion (02/10/2011); Cataract extraction w/ intraocular lens  implant, bilateral; Multiple tooth extractions; ORIF fibula fracture (Left, 01/06/2017); CARDIOVERSION (N/A, 06/20/2017); Colonoscopy with propofol (N/A, 02/20/2018); Cholecystectomy (05/24/2018); and Cholecystectomy (N/A, 05/24/2018).  Brief Summary:  Eric [name] is a 87 y.o. 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      Subjective:   He is here with his family.  He had COVID in January.    He has dry cough.  Sometimes happens when eating.  Mostly happens when he is asleep.  He sleeps in a recliner.  Has snoring and wakes up feeling something stuck in his throat.  He is sleepy during the day and falls asleep watching TV.  Physical Exam:   Appearance - well kempt, uses a walker  ENMT - no sinus tenderness, no oral exudate, no LAN, Mallampati 3 airway, no stridor  Respiratory - decreased breath sounds bilaterally, no wheezing or rales  CV - s1s2 regular rate and rhythm, no murmurs  Ext - no clubbing, no edema  Skin - no rashes  Psych - normal mood and affect   Pulmonary testing:  PFT 12/25/18 >> FEV1 1.10 (45%), FEV1% 64, TLC 3.81 (60%)  Chest Imaging:   CT angio chest 11/02/20 >> b/l GGO in posterior segments of upper lobes and lower lobes  Sleep Tests:  ONO with 2 liters 05/22/20 >> test time 8 hrs 32 min.  Baseline SpO2 91%, SpO2 low 64%.  Spent 57 min 8 sec with SpO2 < 88%.  SpO2 graph suggestive of REM related desaturation pattern that could be seen in sleep apnea.  Cardiac Tests:  Echo 10/20/17 >> EF 60 to 65%, mild LVH, grade 2 DD  Social History:  He  reports that he has quit smoking. His smoking use included cigarettes. He has never used smokeless tobacco. He reports that he does not drink alcohol and does not use drugs.  Family History:  His family history includes Atrial fibrillation in his brother; Cancer in an other family member; Heart disease in his brother; Other in his mother; Stroke in his father and paternal grandfather.     Assessment/Plan:   COPD with chronic bronchitis. - continue trelegy; samples given - prn mucinex and albuterol  Cough. - prn mucinex - advised him to eat his meals slowly - had COVID in January 2022; will arrange for chest xray   Chronic respiratory failure with hypoxia. - uses 4 liters oxygen at night - he has progressive snoring, sleep disruption, apnea, and daytime sleepiness - he has history of hypertension - I am concerned his nocturnal coughing could be related to sleep apnea; will arrange for in lab  sleep study with him starting on 4 liters supplemental oxygen   Time Spent Involved in Patient Care on Day of Examination:  33 minutes  Follow up:   Patient Instructions  Will arrange for chest xray today and will arrange for in lab sleep study  Try using mucinex to help with cough  Follow up in 3 months  Medication List:   Allergies as of 07/27/2021       Reactions   Morphine And Related Shortness Of Breath   Percocet [oxycodone-acetaminophen] Shortness Of Breath   Valium Shortness Of Breath        Medication List        Accurate as of July 27, 2021 11:26 AM. If  you have any questions, ask your nurse or doctor.          STOP taking these medications    predniSONE 10 MG (21) Tbpk tablet Commonly known as: STERAPRED UNI-PAK 21 TAB Stopped by: Chesley Mires, MD       TAKE these medications    albuterol (2.5 MG/3ML) 0.083% nebulizer solution Commonly known as: PROVENTIL Take 2.5 mg by nebulization every 6 (six) hours as needed for wheezing or shortness of breath.   apixaban 5 MG Tabs tablet Commonly known as: Eliquis Take one tablet by mouth twice daily   cetirizine 10 MG tablet Commonly known as: ZYRTEC Take 1 tablet (10 mg total) by mouth daily.   fluticasone 50 MCG/ACT nasal spray Commonly known as: FLONASE SHAKE LIQUID AND USE 2 SPRAYS IN EACH NOSTRIL DAILY   furosemide 40 MG tablet Commonly known as: LASIX Take 1 tablet (40 mg total) by mouth daily.   gabapentin 300 MG capsule Commonly known as: NEURONTIN TAKE 1 CAPSULE(300 MG) BY MOUTH TWICE DAILY   guaiFENesin 600 MG 12 hr tablet Commonly known as: Mucinex Take 2 tablets (1,200 mg total) by mouth 2 (two) times daily as needed for cough or to loosen phlegm.   levothyroxine 25 MCG tablet Commonly known as: SYNTHROID TAKE 1 TABLET(25 MCG) BY MOUTH DAILY BEFORE BREAKFAST   lidocaine 5 % Commonly known as: Lidoderm Place 1 patch onto the skin daily. Remove & Discard patch within 12 hours or as directed by MD   metoprolol tartrate 25 MG tablet Commonly known as: LOPRESSOR TAKE 1 TABLET(25 MG) BY MOUTH TWICE DAILY   OXYGEN Inhale 4 L into the lungs at bedtime.   pantoprazole 40 MG tablet Commonly known as: PROTONIX TAKE 1 TABLET(40 MG) BY MOUTH AT BEDTIME   potassium chloride 10 MEQ tablet Commonly known as: KLOR-CON Take 1 tablet (10 mEq total) by mouth daily.   pravastatin 40 MG tablet Commonly known as: PRAVACHOL MUST SCHEDULE FASTING LABS take 1 by mouth daily   Trelegy Ellipta 100-62.5-25 MCG/INH Aepb Generic drug: Fluticasone-Umeclidin-Vilant INHALE 1  PUFF INTO THE LUNGS DAILY   Trelegy Ellipta 100-62.5-25 MCG/INH Aepb Generic drug: Fluticasone-Umeclidin-Vilant Inhale 1 puff into the lungs daily.   Vitamin D 1000 units capsule Take one tablet once daily        Signature:  Chesley Mires, MD [place] Pager - [phone] 07/27/2021, 11:26 AM

## 2021-07-27 NOTE — Addendum Note (Signed)
Addended by: Valerie Salts on: 07/27/2021 01:25 PM   Modules accepted: Orders

## 2021-07-27 NOTE — Patient Instructions (Signed)
Will arrange for chest xray today and will arrange for in lab sleep study  Try using mucinex to help with cough  Follow up in 3 months

## 2021-07-28 ENCOUNTER — Other Ambulatory Visit: Payer: Self-pay

## 2021-07-28 DIAGNOSIS — J849 Interstitial pulmonary disease, unspecified: Secondary | ICD-10-CM

## 2021-08-11 ENCOUNTER — Ambulatory Visit: Payer: PPO

## 2021-08-13 ENCOUNTER — Encounter: Payer: Self-pay | Admitting: Nurse Practitioner

## 2021-08-13 ENCOUNTER — Ambulatory Visit (INDEPENDENT_AMBULATORY_CARE_PROVIDER_SITE_OTHER): Payer: PPO | Admitting: Nurse Practitioner

## 2021-08-13 ENCOUNTER — Other Ambulatory Visit: Payer: Self-pay

## 2021-08-13 VITALS — BP 156/96 | HR 53 | Temp 97.1°F | Resp 16 | Ht 66.0 in | Wt 175.2 lb

## 2021-08-13 DIAGNOSIS — I480 Paroxysmal atrial fibrillation: Secondary | ICD-10-CM

## 2021-08-13 DIAGNOSIS — N1832 Chronic kidney disease, stage 3b: Secondary | ICD-10-CM

## 2021-08-13 DIAGNOSIS — E039 Hypothyroidism, unspecified: Secondary | ICD-10-CM

## 2021-08-13 DIAGNOSIS — I10 Essential (primary) hypertension: Secondary | ICD-10-CM

## 2021-08-13 DIAGNOSIS — E782 Mixed hyperlipidemia: Secondary | ICD-10-CM

## 2021-08-13 DIAGNOSIS — Z23 Encounter for immunization: Secondary | ICD-10-CM | POA: Diagnosis not present

## 2021-08-13 DIAGNOSIS — M48062 Spinal stenosis, lumbar region with neurogenic claudication: Secondary | ICD-10-CM

## 2021-08-13 DIAGNOSIS — I5032 Chronic diastolic (congestive) heart failure: Secondary | ICD-10-CM

## 2021-08-13 NOTE — Patient Instructions (Signed)
Recommend to get COVID booster and shingrix vaccine at local pharmacy

## 2021-08-13 NOTE — Progress Notes (Signed)
Careteam: Patient Care Team: Lauree Chandler, NP as PCP - General (Geriatric Medicine) [name], MD as PCP - Cardiology (Cardiology) [name], MD as Consulting Physician (Cardiology) Normajean Glasgow, MD as Attending Physician (Physical Medicine and Rehabilitation) Phylliss Bob, MD as Consulting Physician (Orthopedic Surgery) Wilhelmina Mcardle, MD (Inactive) as Consulting Physician (Pulmonary Disease)  PLACE OF SERVICE:  [place] Directive information Does Patient Have a Medical Advance Directive?: No, Would patient like information on creating a medical advance directive?: No - Patient declined  Allergies  Allergen Reactions   Morphine And Related Shortness Of Breath   Percocet [Oxycodone-Acetaminophen] Shortness Of Breath   Valium Shortness Of Breath    Chief Complaint  Patient presents with   Medical Management of Chronic Issues    Routine Visit.   Health Maintenance    Discuss the need for colonoscopy.   Immunizations    Discuss the need for shingrix vaccine, 2nd covid bootser, and influenza vaccine.     HPI: Patient is a 87 y.o. male for routine follow up.   COPD- followed by pulmonary, on trelegy Recent follow up discussed sleep study due to snoring but pt declines- "he does not want it" per daughter.  Breathing "Is doing pretty good" ongoing chronic cough. Not taking mucinex.   Has cleaned up diet. No swelling. He is more active, looking after his wife more.   Chf- continues on lasix and metoprolol  A fib- rate controlled on metoprolol. On eliquis for anticoagulation.   Hypothyroid- on synthroid 25 mcg  Hyperlipidemia- continues on pravastin, due for lipids.   gERD- continues on protonix  Spinal stenosis-continues on gabapentin for pain.  Review of Systems:  Review of Systems  Constitutional:  Negative for chills, fever and weight loss.  HENT:  Negative for tinnitus.   Respiratory:  Positive for cough. Negative for sputum  production and shortness of breath.   Cardiovascular:  Negative for chest pain, palpitations and leg swelling.  Gastrointestinal:  Negative for abdominal pain, constipation, diarrhea and heartburn.  Genitourinary:  Negative for dysuria, frequency and urgency.  Musculoskeletal:  Negative for back pain, falls, joint pain and myalgias.  Skin: Negative.   Neurological:  Negative for dizziness and headaches.  Psychiatric/Behavioral:  Negative for depression and memory loss. The patient does not have insomnia.    Past Medical History:  Diagnosis Date   Anxiety    Arthritis    Atypical chest pain    a. 05/2017 MV: no ischemia, EF 79%.   Chest pain 10/20/2017   Chronic diastolic CHF (congestive heart failure) ([place])    a. 01/2011 Echo: EF 50-55%, gr1 DD, mild AI, nl RV fxn, mild TR/PR; b. 10/2017 Echo: EF 60-65%, mild LVH, gr2 DD.   DDD (degenerative disc disease), cervical    Depressive disorder, not elsewhere classified    Difficult intubation    Dysphagia, oral phase    Dyspnea    Edema    Gallstones    a. Symptomatic - s/p lap chole 05/2018.   History of DVT (deep vein thrombosis)    History of kidney stones    Hypertension    Hypoxemia    Impacted cerumen    Ischemic colitis ([place])    a. 02/2018 GIB - colonoscopy w/ isch colitis. Anticoagulation resumed.   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 a. 01/2011 in setting of post-op complications including aspiration pna;  b. CHA2DS2VASc = 4--> Amio/Eliquis.   Pneumonia 03/06/2003   Spinal stenosis, unspecified region other than cervical    Squamous cell carcinoma of skin of trunk, except scrotum    skin cancer of shoulder   Syncope    a. 10/2017-->Event  monitor: RSR, rare PACs/PVCs.   Thoracic or lumbosacral neuritis or radiculitis, unspecified    Past Surgical History:  Procedure Laterality Date   CARDIOVERSION N/A 06/20/2017   Procedure: CARDIOVERSION;  Surgeon: [name], MD;  Location: [facility];  Service: Cardiovascular;  Laterality: N/A;   CATARACT EXTRACTION W/ INTRAOCULAR LENS  IMPLANT, BILATERAL     CERVICAL FUSION  02/10/2011   CHOLECYSTECTOMY  05/24/2018   CHOLECYSTECTOMY N/A 05/24/2018   Procedure: LAPAROSCOPIC CHOLECYSTECTOMY;  Surgeon: Coralie Keens, MD;  Location: [place];  Service: General;  Laterality: N/A;   COLONOSCOPY WITH PROPOFOL N/A 02/20/2018   Procedure: COLONOSCOPY WITH PROPOFOL;  Surgeon: Lucilla Lame, MD;  Location: [facility];  Service: Endoscopy;  Laterality: N/A;   history of abd ultrasound  11/01   fatty liver   MULTIPLE TOOTH EXTRACTIONS     ORIF FIBULA FRACTURE Left 01/06/2017   Procedure: OPEN REDUCTION INTERNAL FIXATION (ORIF) FIBULA FRACTURE DISTAL FIBULA;  Surgeon: Melrose Nakayama, MD;  Location: [place];  Service: Orthopedics;  Laterality: Left;  Patient states has problems if he will have a tube in throat for Genera; Anesthesia   Social History:   reports that he has quit smoking. His smoking use included cigarettes. He has never used smokeless tobacco. He reports that he does not drink alcohol and does not use drugs.  Family History  Problem Relation Age of Onset   Stroke Father    Atrial fibrillation Brother        on coumadin   Heart disease Brother        AFib- coumadin   Stroke Paternal Grandfather    Other Mother        hemorrhage   Cancer Other        colon cancer at early age   Prostate cancer Neg Hx    Kidney cancer Neg Hx    Bladder Cancer Neg Hx     Medications: Patient's Medications  New Prescriptions   No medications on file  Previous Medications   ALBUTEROL (PROVENTIL) (2.5 MG/3ML) 0.083% NEBULIZER SOLUTION    Take 2.5 mg by nebulization every 6 (six) hours as  needed for wheezing or shortness of breath.   APIXABAN (ELIQUIS) 5 MG TABS TABLET    Take one tablet by mouth twice daily   CETIRIZINE (ZYRTEC) 10 MG TABLET    Take 1 tablet (10 mg total) by mouth daily.   CHOLECALCIFEROL (VITAMIN D) 1000 UNITS CAPSULE    Take one tablet once daily   FLUTICASONE (FLONASE) 50 MCG/ACT NASAL SPRAY    SHAKE LIQUID AND USE 2 SPRAYS IN EACH NOSTRIL DAILY   FUROSEMIDE (LASIX) 40 MG TABLET    Take 1 tablet (40 mg total) by mouth daily.   GABAPENTIN (NEURONTIN) 300 MG CAPSULE    TAKE 1 CAPSULE(300 MG) BY MOUTH TWICE DAILY   GUAIFENESIN (MUCINEX) 600 MG 12 HR TABLET    Take 2 tablets (1,200 mg total) by mouth 2 (two)  times daily as needed for cough or to loosen phlegm.   LEVOTHYROXINE (SYNTHROID) 25 MCG TABLET    TAKE 1 TABLET(25 MCG) BY MOUTH DAILY BEFORE BREAKFAST   LIDOCAINE (LIDODERM) 5 %    Place 1 patch onto the skin daily. Remove & Discard patch within 12 hours or as directed by MD   METOPROLOL TARTRATE (LOPRESSOR) 25 MG TABLET    TAKE 1 TABLET(25 MG) BY MOUTH TWICE DAILY   OXYGEN    Inhale 4 L into the lungs at bedtime.    PANTOPRAZOLE (PROTONIX) 40 MG TABLET    TAKE 1 TABLET(40 MG) BY MOUTH AT BEDTIME   POTASSIUM CHLORIDE SA (K-DUR,KLOR-CON) 10 MEQ TABLET    Take 1 tablet (10 mEq total) by mouth daily.   PRAVASTATIN (PRAVACHOL) 40 MG TABLET    MUST SCHEDULE FASTING LABS take 1 by mouth daily   TRELEGY ELLIPTA 100-62.5-25 MCG/INH AEPB    INHALE 1 PUFF INTO THE LUNGS DAILY  Modified Medications   No medications on file  Discontinued Medications   FLUTICASONE-UMECLIDIN-VILANT (TRELEGY ELLIPTA) 100-62.5-25 MCG/INH AEPB    Inhale 1 puff into the lungs daily.   FLUTICASONE-UMECLIDIN-VILANT (TRELEGY ELLIPTA) 100-62.5-25 MCG/INH AEPB    Inhale 1 puff into the lungs daily.    Physical Exam:  Vitals:   08/13/21 1031  BP: (!) 156/96  Pulse: (!) 53  Resp: 16  Temp: (!) 97.1 F (36.2 C)  SpO2: 96%  Weight: 175 lb 3.2 oz (79.5 kg)  Height: 5' 6" (1.676 m)   Body  mass index is 28.28 kg/m. Wt Readings from Last 3 Encounters:  08/13/21 175 lb 3.2 oz (79.5 kg)  07/27/21 173 lb 12.8 oz (78.8 kg)  12/31/20 195 lb 6.4 oz (88.6 kg)    Physical Exam Constitutional:      General: He is not in acute distress.    Appearance: He is well-developed. He is not diaphoretic.  HENT:     Head: Normocephalic and atraumatic.     Right Ear: External ear normal.     Left Ear: External ear normal.     Mouth/Throat:     Pharynx: No oropharyngeal exudate.  Eyes:     Conjunctiva/sclera: Conjunctivae normal.     Pupils: Pupils are equal, round, and reactive to light.  Cardiovascular:     Rate and Rhythm: Normal rate and regular rhythm.     Heart sounds: Normal heart sounds.  Pulmonary:     Effort: Pulmonary effort is normal.     Breath sounds: Normal breath sounds.  Abdominal:     General: Bowel sounds are normal.     Palpations: Abdomen is soft.  Musculoskeletal:        General: No tenderness.     Cervical back: Normal range of motion and neck supple.     Right lower leg: No edema.     Left lower leg: No edema.  Skin:    General: Skin is warm and dry.  Neurological:     Mental Status: He is alert and oriented to person, place, and time.    Labs reviewed: Basic Metabolic Panel: Recent Labs    11/02/20 1337  NA 139  K 4.4  CL 104  CO2 25  GLUCOSE 87  BUN 20  CREATININE 1.56*  CALCIUM 9.2   Liver Function Tests: No results for input(s): AST, ALT, ALKPHOS, BILITOT, PROT, ALBUMIN in the last 8760 hours. No results for input(s): LIPASE, AMYLASE in the last 8760 hours. No results for input(s): AMMONIA  in the last 8760 hours. CBC: Recent Labs    11/02/20 1337  WBC 6.0  HGB 14.4  HCT 46.6  MCV 97.7  PLT 179   Lipid Panel: No results for input(s): CHOL, HDL, LDLCALC, TRIG, CHOLHDL, LDLDIRECT in the last 8760 hours. TSH: No results for input(s): TSH in the last 8760 hours. A1C: Lab Results  Component Value Date   HGBA1C 5.7 (H)  05/16/2016     Assessment/Plan 1. Hypothyroidism, unspecified type -continues on synthroid 25 mcg - TSH  2. Mixed hyperlipidemia -continues on pravastatin  - Lipid panel - CMP with eGFR([facility])  3. Stage 3b chronic kidney disease ([place]) -Encourage proper hydration and to avoid NSAIDS (Aleve, Advil, Motrin, Ibuprofen)  - CMP with eGFR([facility])  4. Paroxysmal atrial fibrillation ([facility]) -rate controlled on lopressor and continues on eliquis 5 mg twice daily - will need dose adjustment if Cr remains >1.5 - CBC with Differential/Platelet  5. Essential hypertension -Blood pressure well controlled Continue current medications Recheck metabolic panel - CMP with eGFR([facility]) - CBC with Differential/Platelet  6. Chronic diastolic CHF (congestive heart failure) ([facility]) Euvolemic, continues on lasix with metoprolol.   7. Spinal stenosis, lumbar region, with neurogenic claudication Pain controlled on gabapentin.  8. Need for influenza vaccination - Flu Vaccine QUAD High Dose(Fluad)   Next appt: 6 months.  Carlos American. [place], [facility] [phone]

## 2021-08-14 LAB — CBC WITH DIFFERENTIAL/PLATELET
Absolute Monocytes: 428 cells/uL (ref 200–950)
Basophils Absolute: 51 cells/uL (ref 0–200)
Basophils Relative: 0.9 %
Eosinophils Absolute: 63 cells/uL (ref 15–500)
Eosinophils Relative: 1.1 %
HCT: 46.6 % (ref 38.5–50.0)
Hemoglobin: 15.4 g/dL (ref 13.2–17.1)
Lymphs Abs: 1716 cells/uL (ref 850–3900)
MCH: 31.8 pg (ref 27.0–33.0)
MCHC: 33 g/dL (ref 32.0–36.0)
MCV: 96.1 fL (ref 80.0–100.0)
MPV: 10.9 fL (ref 7.5–12.5)
Monocytes Relative: 7.5 %
Neutro Abs: 3443 cells/uL (ref 1500–7800)
Neutrophils Relative %: 60.4 %
Platelets: 175 10*3/uL (ref 140–400)
RBC: 4.85 10*6/uL (ref 4.20–5.80)
RDW: 12.7 % (ref 11.0–15.0)
Total Lymphocyte: 30.1 %
WBC: 5.7 10*3/uL (ref 3.8–10.8)

## 2021-08-14 LAB — COMPLETE METABOLIC PANEL WITH GFR
AG Ratio: 1.6 (calc) (ref 1.0–2.5)
ALT: 33 U/L (ref 9–46)
AST: 35 U/L (ref 10–35)
Albumin: 4.4 g/dL (ref 3.6–5.1)
Alkaline phosphatase (APISO): 60 U/L (ref 35–144)
BUN: 20 mg/dL (ref 7–25)
CO2: 29 mmol/L (ref 20–32)
Calcium: 9.4 mg/dL (ref 8.6–10.3)
Chloride: 105 mmol/L (ref 98–110)
Creat: 1.04 mg/dL (ref 0.70–1.22)
Globulin: 2.8 g/dL (calc) (ref 1.9–3.7)
Glucose, Bld: 90 mg/dL (ref 65–99)
Potassium: 4.3 mmol/L (ref 3.5–5.3)
Sodium: 142 mmol/L (ref 135–146)
Total Bilirubin: 1.4 mg/dL — ABNORMAL HIGH (ref 0.2–1.2)
Total Protein: 7.2 g/dL (ref 6.1–8.1)
eGFR: 69 mL/min/{1.73_m2} (ref >=60)

## 2021-08-14 LAB — LIPID PANEL
Cholesterol: 112 mg/dL (ref <200)
HDL: 33 mg/dL — ABNORMAL LOW (ref >=40)
LDL Cholesterol (Calc): 58 mg/dL (calc)
Non-HDL Cholesterol (Calc): 79 mg/dL (calc) (ref <130)
Total CHOL/HDL Ratio: 3.4 (calc) (ref <5.0)
Triglycerides: 125 mg/dL (ref <150)

## 2021-08-14 LAB — TSH: TSH: 1.62 mIU/L (ref 0.40–4.50)

## 2021-08-21 ENCOUNTER — Other Ambulatory Visit: Payer: Self-pay

## 2021-08-21 ENCOUNTER — Ambulatory Visit
Admission: RE | Admit: 2021-08-21 | Discharge: 2021-08-21 | Disposition: A | Discharge status: Home / Self Care | Payer: PPO | Source: Ambulatory Visit | Attending: Pulmonary Disease | Admitting: Pulmonary Disease

## 2021-08-21 DIAGNOSIS — R0602 Shortness of breath: Secondary | ICD-10-CM | POA: Diagnosis not present

## 2021-08-21 DIAGNOSIS — I700 Atherosclerosis of aorta: Secondary | ICD-10-CM | POA: Diagnosis not present

## 2021-08-21 DIAGNOSIS — J849 Interstitial pulmonary disease, unspecified: Secondary | ICD-10-CM | POA: Diagnosis not present

## 2021-08-23 NOTE — Progress Notes (Signed)
Date:  08/24/2021   ID:  Eric [name], DOB 30-Jan-1932, MRN [id number]  Patient Location:  [id number] [place] [id number]   Provider location:   [facility], [place] office  PCP:  Lauree Chandler, NP  Cardiologist:  Arvid Right [facility]  Chief Complaint  Patient presents with   12 month follow up     "Doing well." Medications reviewed by the patient verbally.     History of Present Illness:    Eric [name] is a 87 y.o. male PMH of  atrial fibrillation, frequent PVCs,  maintaining sinus rhythm, severe neck disease and stenosis who underwent  cervical and thoracic laminectomy and fusion with instrumentation at the beginning of March 2012 with postoperative complications including aspiration requiring a feeding tube  Remote smoking in the [employer] Chronic SOB, deconditioned Cardioversion 06/2017 who presents for routine followup of his atrial fibrillation,  paroxysmal/persistent  Off amiodarone, TSH peak of 21 Now TSH 1.6 off amiodarone on thyroid supplement 25 ug daily  On eliquis , metoprolol  Total chol 112, LDL 58  Limited by weak legs and chronic shortness of breath  Walks with a walker He did complete physical therapy  CT chest 08/21/2021 reviewed There is diffuse bronchial wall thickening in the bilateral lower lobes, as well as scattered bronchiolar plugging, bandlike scarring and dependent ground-glass. Findings are consistent with sequelae of infection or aspiration, which Lavey be ongoing. 2. Cardiomegaly and coronary artery disease. 3. Enlargement of the tubular ascending thoracic aorta, measuring up to 4.0 x 4.0 cm. Recommend annual imaging followup by CTA or MRA if clinically appropriate. This recommendation follows 2010 ACCF/AHA/AATS/ACR/ASA/SCA/SCAI/SIR/STS/SVM Guidelines for the Diagnosis and Management of Patients with Thoracic Aortic Disease. Circulation. 2010; [id number]. Aortic aneurysm NOS (ICD10-I71.9) 4. Enlargement of the  main pulmonary artery measuring up to 3.6 cm in caliber, as can be seen in pulmonary hypertension.  EKG personally reviewed by myself on todays visit Shows atrial fibrillation, rate 55 bpm PVCs  Other past medical history reviewed  hospital March 18-21 , 2020 Admitted for sepsis and acute on chronic hypoxic respiratory failure secondary to bronchitis and rhinovirus infection. Initially presented with fever, chills, cough and shortness of breath Sent home with prednisone  Event Monitor Avg HR of 58 bpm. Predominant underlying rhythm was Sinus Rhythm.  Second Degree AV Block-Mobitz I (Wenckebach) was present.  Isolated SVEs were rare (<1.0%), and no SVE Couplets or SVE Triplets were present. Isolated VEs were rare (<1.0%), and no VE Couplets or VE Triplets were present. Patient triggered events were not associated with significant arrhythmia.  Echocardiogram 04/04/2017 with ejection fraction 55-60%, mildly dilated left atrium    Past Medical History:  Diagnosis Date   Anxiety    Arthritis    Atypical chest pain    a. 05/2017 MV: no ischemia, EF 79%.   Chest pain 10/20/2017   Chronic diastolic CHF (congestive heart failure) ([place])    a. 01/2011 Echo: EF 50-55%, gr1 DD, mild AI, nl RV fxn, mild TR/PR; b. 10/2017 Echo: EF 60-65%, mild LVH, gr2 DD.   DDD (degenerative disc disease), cervical    Depressive disorder, not elsewhere classified    Difficult intubation    Dysphagia, oral phase    Dyspnea    Edema    Gallstones    a. Symptomatic - s/p lap chole 05/2018.   History of DVT (deep vein thrombosis)    History of kidney stones    Hypertension    Hypoxemia  Impacted cerumen    Ischemic colitis ([place])    a. 02/2018 GIB - colonoscopy w/ isch colitis. Anticoagulation resumed.   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 a. 01/2011 in setting of post-op complications including aspiration pna;  b. CHA2DS2VASc = 4--> Amio/Eliquis.   Pneumonia 03/06/2003   Spinal stenosis, unspecified region other than cervical    Squamous cell carcinoma of skin of trunk, except scrotum    skin cancer of shoulder   Syncope    a. 10/2017-->Event monitor: RSR, rare PACs/PVCs.   Thoracic or lumbosacral neuritis or radiculitis, unspecified    Past Surgical History:  Procedure Laterality Date   CARDIOVERSION N/A 06/20/2017   Procedure: CARDIOVERSION;  Surgeon: [name], MD;  Location: [facility] ORS;  Service: Cardiovascular;  Laterality: N/A;   CATARACT EXTRACTION W/ INTRAOCULAR LENS  IMPLANT, BILATERAL     CERVICAL FUSION  02/10/2011   CHOLECYSTECTOMY  05/24/2018   CHOLECYSTECTOMY N/A 05/24/2018   Procedure: LAPAROSCOPIC CHOLECYSTECTOMY;  Surgeon: Coralie Keens, MD;  Location: [facility];  Service: General;  Laterality: N/A;   COLONOSCOPY WITH PROPOFOL N/A 02/20/2018   Procedure: COLONOSCOPY WITH PROPOFOL;  Surgeon: Lucilla Lame, MD;  Location: [facility] ENDOSCOPY;  Service: Endoscopy;  Laterality: N/A;   history of abd ultrasound  11/01   fatty liver   MULTIPLE TOOTH EXTRACTIONS     ORIF FIBULA FRACTURE Left 01/06/2017   Procedure: OPEN REDUCTION INTERNAL FIXATION (ORIF) FIBULA FRACTURE DISTAL FIBULA;  Surgeon: Melrose Nakayama, MD;  Location: [facility];  Service: Orthopedics;  Laterality: Left;  Patient states has problems if he will have a tube in throat for Genera; Anesthesia     Current Meds  Medication Sig   albuterol (PROVENTIL) (2.5 MG/3ML) 0.083% nebulizer solution Take 2.5 mg by nebulization every 6 (six) hours as needed for wheezing or shortness of breath.   apixaban (ELIQUIS) 5 MG TABS tablet Take one tablet by mouth twice daily   cetirizine (ZYRTEC) 10 MG tablet Take 1 tablet (10 mg total) by mouth  daily.   Cholecalciferol (VITAMIN D) 1000 UNITS capsule Take one tablet once daily   fluticasone (FLONASE) 50 MCG/ACT nasal spray SHAKE LIQUID AND USE 2 SPRAYS IN EACH NOSTRIL DAILY   furosemide (LASIX) 40 MG tablet Take 1 tablet (40 mg total) by mouth daily.   gabapentin (NEURONTIN) 300 MG capsule TAKE 1 CAPSULE(300 MG) BY MOUTH TWICE DAILY   guaiFENesin (MUCINEX) 600 MG 12 hr tablet Take 2 tablets (1,200 mg total) by mouth 2 (two) times daily as needed for cough or to loosen phlegm.   levothyroxine (SYNTHROID) 25 MCG tablet TAKE 1 TABLET(25 MCG) BY MOUTH DAILY BEFORE BREAKFAST   lidocaine (LIDODERM) 5 % Place 1 patch onto the skin daily. Remove & Discard patch within 12 hours or as directed by MD   metoprolol tartrate (LOPRESSOR) 25 MG tablet TAKE 1 TABLET(25 MG) BY MOUTH TWICE DAILY   OXYGEN Inhale 4 L into the lungs at bedtime.    pantoprazole (PROTONIX) 40 MG tablet TAKE 1 TABLET(40 MG) BY MOUTH AT BEDTIME   potassium chloride SA (K-DUR,KLOR-CON) 10 MEQ tablet Take 1 tablet (10 mEq total) by  mouth daily.   pravastatin (PRAVACHOL) 40 MG tablet MUST SCHEDULE FASTING LABS take 1 by mouth daily   TRELEGY ELLIPTA 100-62.5-25 MCG/INH AEPB INHALE 1 PUFF INTO THE LUNGS DAILY     Allergies:   Morphine and related, Percocet [oxycodone-acetaminophen], and Valium   Social History   Tobacco Use   Smoking status: Former    Years: 1.00    Types: Cigarettes   Smokeless tobacco: Never   Tobacco comments:    stopped in 20's  Vaping Use   Vaping Use: Never used  Substance Use Topics   Alcohol use: No   Drug use: No     Family Hx: The patient's family history includes Atrial fibrillation in his brother; Cancer in an other family member; Heart disease in his brother; Other in his mother; Stroke in his father and paternal grandfather. There is no history of Prostate cancer, Kidney cancer, or Bladder Cancer.  ROS:   Please see the history of present illness.    Review of Systems   Constitutional: Negative.   HENT: Negative.    Respiratory:  Positive for shortness of breath.   Cardiovascular: Negative.   Gastrointestinal: Negative.   Musculoskeletal: Negative.   Neurological: Negative.   Psychiatric/Behavioral: Negative.    All other systems reviewed and are negative.   Labs/Other Tests and Data Reviewed:    Recent Labs: 08/13/2021: ALT 33; BUN 20; Creat 1.04; Hemoglobin 15.4; Platelets 175; Potassium 4.3; Sodium 142; TSH 1.62   Recent Lipid Panel Lab Results  Component Value Date/Time   CHOL 112 08/13/2021 10:45 AM   CHOL 169 05/16/2016 10:35 AM   TRIG 125 08/13/2021 10:45 AM   HDL 33 (L) 08/13/2021 10:45 AM   HDL 37 (L) 05/16/2016 10:35 AM   CHOLHDL 3.4 08/13/2021 10:45 AM   LDLCALC 58 08/13/2021 10:45 AM   LDLDIRECT 97.4 11/29/2010 09:21 AM    Wt Readings from Last 3 Encounters:  08/24/21 174 lb 8 oz (79.2 kg)  08/13/21 175 lb 3.2 oz (79.5 kg)  07/27/21 173 lb 12.8 oz (78.8 kg)     Exam:    BP (!) 130/58 (BP Location: Left Arm, Patient Position: Sitting, Cuff Size: Normal)   Pulse (!) 56   Ht '5\' 6"'$  (1.676 m)   Wt 174 lb 8 oz (79.2 kg)   SpO2 95%   BMI 28.17 kg/m  Constitutional:  oriented to person, place, and time. No distress.  HENT:  Head: Grossly normal Eyes:  no discharge. No scleral icterus.  Neck: No JVD, no carotid bruits  Cardiovascular: Regular rate and rhythm, no murmurs appreciated Pulmonary/Chest: Clear to auscultation bilaterally, no wheezes or rails Abdominal: Soft.  no distension.  no tenderness.  Musculoskeletal: Normal range of motion Neurological:  normal muscle tone. Coordination normal. No atrophy Skin: Skin warm and dry Psychiatric: normal affect, pleasant   ASSESSMENT & PLAN:    PAF (paroxysmal atrial fibrillation) (HCC) Off amiodarone, coverted to atrial fibrillation Continue eliquis, metoprolol No sx, continue rate control  Chronic heart failure with preserved ejection fraction (HCC) euvolemic   Recommend he continue Lasix 40 mg daily CR normalized  Chronic respiratory distress/COPD Chronic cough in the morning consistent with chronic bronchitis No recent hospitalizations On oxygen at night Not using nebs, recommend he uses nebs Ct scan reviewed.  He has follow-up with pulmonary in the next several weeks  Mixed hyperlipidemia Cholesterol is at goal on the current lipid regimen. No changes to the medications were made.  HYPERTENSION, BENIGN ESSENTIAL Blood pressure is  well controlled on today's visit. No changes made to the medications. Stable  Hypothyroidism TSH normal   Total encounter time more than 25 minutes  Greater than 50% was spent in counseling and coordination of care with the patient    Signed, Ida Rogue, MD  08/24/2021 11:00 AM    [place] Office [address], [place], [place] [address]

## 2021-08-24 ENCOUNTER — Telehealth: Payer: Self-pay

## 2021-08-24 ENCOUNTER — Telehealth: Payer: Self-pay | Admitting: Pulmonary Disease

## 2021-08-24 ENCOUNTER — Encounter: Payer: Self-pay | Admitting: Cardiovascular Disease

## 2021-08-24 ENCOUNTER — Other Ambulatory Visit: Payer: Self-pay

## 2021-08-24 ENCOUNTER — Ambulatory Visit: Payer: PPO | Admitting: Cardiovascular Disease

## 2021-08-24 VITALS — BP 130/58 | HR 56 | Ht 66.0 in | Wt 174.5 lb

## 2021-08-24 DIAGNOSIS — I480 Paroxysmal atrial fibrillation: Secondary | ICD-10-CM

## 2021-08-24 DIAGNOSIS — R42 Dizziness and giddiness: Secondary | ICD-10-CM

## 2021-08-24 DIAGNOSIS — I5033 Acute on chronic diastolic (congestive) heart failure: Secondary | ICD-10-CM | POA: Diagnosis not present

## 2021-08-24 DIAGNOSIS — I10 Essential (primary) hypertension: Secondary | ICD-10-CM | POA: Diagnosis not present

## 2021-08-24 DIAGNOSIS — R7989 Other specified abnormal findings of blood chemistry: Secondary | ICD-10-CM

## 2021-08-24 DIAGNOSIS — I5032 Chronic diastolic (congestive) heart failure: Secondary | ICD-10-CM | POA: Diagnosis not present

## 2021-08-24 DIAGNOSIS — R55 Syncope and collapse: Secondary | ICD-10-CM | POA: Diagnosis not present

## 2021-08-24 DIAGNOSIS — J9621 Acute and chronic respiratory failure with hypoxia: Secondary | ICD-10-CM | POA: Diagnosis not present

## 2021-08-24 DIAGNOSIS — N1832 Chronic kidney disease, stage 3b: Secondary | ICD-10-CM | POA: Diagnosis not present

## 2021-08-24 DIAGNOSIS — E782 Mixed hyperlipidemia: Secondary | ICD-10-CM | POA: Diagnosis not present

## 2021-08-24 MED ORDER — TRELEGY ELLIPTA 100-62.5-25 MCG/INH IN AEPB: 1.0000 | INHALATION_SPRAY | Freq: Every day | RESPIRATORY_TRACT | 0 refills | Status: DC

## 2021-08-24 MED ORDER — POTASSIUM CHLORIDE CRYS ER 10 MEQ PO TBCR
10.0000 meq | EXTENDED_RELEASE_TABLET | Freq: Every day | ORAL | 3 refills | Status: DC
Start: 2021-08-24 — End: 2022-11-21

## 2021-08-24 MED ORDER — FUROSEMIDE 40 MG PO TABS
40.0000 mg | ORAL_TABLET | Freq: Every day | ORAL | 3 refills | Status: DC
Start: 2021-08-24 — End: 2022-11-21

## 2021-08-24 MED ORDER — PRAVASTATIN SODIUM 40 MG PO TABS: ORAL_TABLET | ORAL | 0 refills | Status: DC

## 2021-08-24 MED ORDER — METOPROLOL TARTRATE 25 MG PO TABS: ORAL_TABLET | ORAL | 3 refills | Status: DC

## 2021-08-24 NOTE — Telephone Encounter (Signed)
ELIQUIS '5MG'$  OK FOR SAMPLE.  Routing back to [employer] sample request for Eliquis received. Indication:afib Last office visit:gollan 08/24/21 Scr:1.04 08/13/21 Age: 29mWeight:79.2

## 2021-08-24 NOTE — Telephone Encounter (Signed)
Called and spoke with Patient's daughter Eric [name] ([place]).  Patient is currently in the doughnut hole and his Trelegy will cost $777. Eric [name] stated Patient was requesting samples to get him through a few weeks.  Trelegy samples placed at the front desk for pick up.  Nothing further at this time.

## 2021-08-24 NOTE — Telephone Encounter (Signed)
Was able to reach out to pt to advised Pharmacy reviewed his med list and recent labs, okay to stay on Eliquis 5 mg BID  2 boxes of Eliquis samples ready for him to pick up at the front desk. Lot: [id number] Exp: 05/2023  [name] very thankful and will have his daughter or granddaughter pick up his samples sometime this week.

## 2021-08-24 NOTE — Patient Instructions (Addendum)
PA application given in office, please bring back with required documents. Will call with samples when ready to pick up  Medication Instructions:  No changes  If you need a refill on your cardiac medications before your next appointment, please call your pharmacy.   Lab work: No new labs needed  Testing/Procedures: No new testing needed  Follow-Up: At [facility], you and your health needs are our priority.  As part of our continuing mission to provide you with exceptional heart care, we have created designated Provider Care Teams.  These Care Teams include your primary Cardiologist (physician) and Advanced Practice Providers (APPs -  Physician Assistants and Nurse Practitioners) who all work together to provide you with the care you need, when you need it.  You will need a follow up appointment in 12 months  Providers on your designated Care Team:   Murray Hodgkins, NP Christell Faith, PA-C Marrianne Mood, PA-C [place], [place]  COVID-19 Vaccine Information can be found at: [email or web address] For questions related to vaccine distribution or appointments, please email [email or web address] or call [phone].

## 2021-08-24 NOTE — Telephone Encounter (Signed)
-----   Message from Wynema Birch, RN sent at 08/24/2021 11:32 AM EDT ----- Regarding: Eliquis Pt requesting Eliquis samples $700 at pharmacy Also has another med Trelegy that is also very costly.  PA application given to pt in clinic  Thanks [place], RN

## 2021-08-24 NOTE — Telephone Encounter (Signed)
Left message for patient's daughter Jenny Reichmann to return call.

## 2021-09-03 ENCOUNTER — Other Ambulatory Visit: Payer: Self-pay

## 2021-09-03 MED ORDER — APIXABAN 5 MG PO TABS: ORAL_TABLET | ORAL | 3 refills | Status: DC

## 2021-09-19 ENCOUNTER — Encounter (HOSPITAL_BASED_OUTPATIENT_CLINIC_OR_DEPARTMENT_OTHER): Payer: PPO | Admitting: Pulmonary Disease

## 2021-09-20 ENCOUNTER — Telehealth: Payer: Self-pay | Admitting: Pulmonary Disease

## 2021-09-20 MED ORDER — TRELEGY ELLIPTA 100-62.5-25 MCG/INH IN AEPB: 1.0000 | INHALATION_SPRAY | Freq: Every day | RESPIRATORY_TRACT | 0 refills | Status: DC

## 2021-09-20 NOTE — Telephone Encounter (Signed)
Appt changed to televisit. Daughter wants to know if they can pick up samples of trelegy 100 from [place] office because [place] is too far for her to drive. Advised her I would check with [place] office and let her know if they had them.

## 2021-09-20 NOTE — Telephone Encounter (Signed)
VS please advise if you are ok with the 10/13 appt being a televisit?  Thanks

## 2021-09-20 NOTE — Telephone Encounter (Signed)
Okay to change to tele/video visit.

## 2021-09-20 NOTE — Telephone Encounter (Signed)
Called and spoke with daughter Jenny Reichmann per DPR to let her know that samples have been placed up front for pickup. She expressed understanding and stated she would be by in the next few days to pick them up. Nothing further needed at this time.

## 2021-09-23 ENCOUNTER — Other Ambulatory Visit: Payer: Self-pay

## 2021-09-23 ENCOUNTER — Telehealth: Payer: Self-pay | Admitting: Cardiovascular Disease

## 2021-09-23 ENCOUNTER — Encounter: Payer: Self-pay | Admitting: Pulmonary Disease

## 2021-09-23 ENCOUNTER — Ambulatory Visit (INDEPENDENT_AMBULATORY_CARE_PROVIDER_SITE_OTHER): Payer: PPO | Admitting: Pulmonary Disease

## 2021-09-23 DIAGNOSIS — J9611 Chronic respiratory failure with hypoxia: Secondary | ICD-10-CM

## 2021-09-23 DIAGNOSIS — J449 Chronic obstructive pulmonary disease, unspecified: Secondary | ICD-10-CM

## 2021-09-23 DIAGNOSIS — T17908S Unspecified foreign body in respiratory tract, part unspecified causing other injury, sequela: Secondary | ICD-10-CM

## 2021-09-23 NOTE — Telephone Encounter (Signed)
  Patient Consent for Virtual Visit        Bush [place] has provided verbal consent on 09/23/2021 for a virtual visit (video or telephone).   CONSENT FOR VIRTUAL VISIT FOR:  Jeniel Slauson Bahl  By participating in this virtual visit I agree to the following:  I hereby voluntarily request, consent and authorize [facility] and its employed or contracted physicians, physician assistants, nurse practitioners or other licensed health care professionals (the Practitioner), to provide me with telemedicine health care services (the "Services") as deemed necessary by the treating Practitioner. I acknowledge and consent to receive the Services by the Practitioner via telemedicine. I understand that the telemedicine visit will involve communicating with the Practitioner through live audiovisual communication technology and the disclosure of certain medical information by electronic transmission. I acknowledge that I have been given the opportunity to request an in-person assessment or other available alternative prior to the telemedicine visit and am voluntarily participating in the telemedicine visit.  I understand that I have the right to withhold or withdraw my consent to the use of telemedicine in the course of my care at any time, without affecting my right to future care or treatment, and that the Practitioner or I [place] terminate the telemedicine visit at any time. I understand that I have the right to inspect all information obtained and/or recorded in the course of the telemedicine visit and [place] receive copies of available information for a reasonable fee.  I understand that some of the potential risks of receiving the Services via telemedicine include:  Delay or interruption in medical evaluation due to technological equipment failure or disruption; Information transmitted [place] not be sufficient (e.g. poor resolution of images) to allow for appropriate medical decision making by the Practitioner; and/or  In  rare instances, security protocols could fail, causing a breach of personal health information.  Furthermore, I acknowledge that it is my responsibility to provide information about my medical history, conditions and care that is complete and accurate to the best of my ability. I acknowledge that Practitioner's advice, recommendations, and/or decision Dot[place] be based on factors not within their control, such as incomplete or inaccurate data provided by me or distortions of diagnostic images or specimens that [place] result from electronic transmissions. I understand that the practice of medicine is not an exact science and that Practitioner makes no warranties or guarantees regarding treatment outcomes. I acknowledge that a copy of this consent can be made available to me via my patient portal ([facility]), or I can request a printed copy by calling the office of [facility].    I understand that my insurance will be billed for this visit.   I have read or had this consent read to me. I understand the contents of this consent, which adequately explains the benefits and risks of the Services being provided via telemedicine.  I have been provided ample opportunity to ask questions regarding this consent and the Services and have had my questions answered to my satisfaction. I give my informed consent for the services to be provided through the use of telemedicine in my medical care

## 2021-09-23 NOTE — Progress Notes (Signed)
[facility]  Chief Complaint  Patient presents with   Follow-up    CT scan    Constitutional:  There were no vitals taken for this visit.  Deferred  Past Medical History:  Anxiety, OA, Chronic diastolic CHF, Depression, Dysphagia, DVT, Nephrolithiasis, HTN, Ischemic colitis, Back pain, HLD, PAF, PNA 2004, Spinal stenosis, COVID 30 December 2020  Past Surgical History:  He  has a past surgical history that includes history of abd ultrasound (11/01); Cervical fusion (02/10/2011); Cataract extraction w/ intraocular lens  implant, bilateral; Multiple tooth extractions; ORIF fibula fracture (Left, 01/06/2017); CARDIOVERSION (N/A, 06/20/2017); Colonoscopy with propofol (N/A, 02/20/2018); Cholecystectomy (05/24/2018); and Cholecystectomy (N/A, 05/24/2018).  Brief Summary:  Eric [name] is a 87 y.o. 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      Subjective:  Virtual Visit via Telephone Note  I connected with Montey Hora Carta on 09/23/21 at 10:30 AM EDT by telephone and verified that I am speaking with the correct person using two identifiers.  Location: Patient: home Provider: medical office   I discussed the limitations, risks, security and privacy concerns of performing an evaluation and management service by telephone and the availability of in person appointments. I also discussed with the patient that there Vellucci be a patient responsible charge related to this service. The patient expressed understanding and agreed to proceed.   History of Present Illness:  He had CT chest in September.  Showed atherosclerosis, enlarged ascending aorta, changes of chronic aspiration and chronic bronchitis.  He has dry cough.  Hasn't been using mucinex or albuterol much.  Trelegy helps.  No chest pain, fever, hemoptysis, or wheezing.  Using 4 liters oxygen at night.  Opted against doing sleep study.   Physical Exam:    Deferred.   Pulmonary testing:  PFT 12/25/18 >> FEV1 1.10 (45%), FEV1% 64, TLC 3.81 (60%)  Chest Imaging:  CT angio chest 11/02/20 >> b/l GGO in posterior segments of upper lobes and lower lobes HRCT CT chest 08/23/21 >> ascending aorta 4 cm, atherosclerosis, , PA 3.6 cm, calcified Lt hilar LN, diffuse bronchial thickening, scattered plugs, band like scarring and dependent GGO  Sleep Tests:  ONO with 2 liters 05/22/20 >> test time 8 hrs 32 min.  Baseline SpO2 91%, SpO2 low 64%.  Spent 57 min 8 sec with SpO2 < 88%.  SpO2 graph suggestive of REM related desaturation pattern that could be seen in sleep apnea.  Cardiac Tests:  Echo 10/20/17 >> EF 60 to 65%, mild LVH, grade 2 DD  Social History:  He  reports that he has quit smoking. His smoking use included cigarettes. He has never used smokeless tobacco. He reports that he does not drink alcohol and does not use drugs.  Family History:  His family history includes Atrial fibrillation in his brother; Cancer in an other family member; Heart disease in his brother; Other in his mother; Stroke in his father and paternal grandfather.     Assessment/Plan:   COPD with chronic bronchitis. - continue trelegy 100 one puff daily - discussed roles for mucinex and albuterol and advised him to use on more regular basis  Dysphagia with chronic aspiration. - bronchial hygiene - discussed symptoms to monitor for that would indicate he has a respiratory infection and would need antibiotics   Chronic respiratory failure with hypoxia. - he uses 4 liters oxygen at night  Ascending aortic aneurysm. - he is followed by Dr. Rockey Situ  with cardiology for atrial fibrillation - advised him to discuss with Dr. Rockey Situ at his next follow up, but suspect at best he would need clinical monitoring because I don't think he would be able to tolerate any interventions   Time Spent Involved in Patient Care on Day of Examination:   I discussed the assessment and  treatment plan with the patient. The patient was provided an opportunity to ask questions and all were answered. The patient agreed with the plan and demonstrated an understanding of the instructions.   The patient was advised to call back or seek an in-person evaluation if the symptoms worsen or if the condition fails to improve as anticipated.  I provided 23 minutes of non-face-to-face time during this encounter.  Follow up:   Patient Instructions  Try using mucinex and albuterol more often to help with cough  Follow up in 4 months  Medication List:   Allergies as of 09/23/2021       Reactions   Morphine And Related Shortness Of Breath   Percocet [oxycodone-acetaminophen] Shortness Of Breath   Valium Shortness Of Breath        Medication List        Accurate as of September 23, 2021 11:00 AM. If you have any questions, ask your nurse or doctor.          albuterol (2.5 MG/3ML) 0.083% nebulizer solution Commonly known as: PROVENTIL Take 2.5 mg by nebulization every 6 (six) hours as needed for wheezing or shortness of breath.   apixaban 5 MG Tabs tablet Commonly known as: Eliquis Take one tablet by mouth twice daily   cetirizine 10 MG tablet Commonly known as: ZYRTEC Take 1 tablet (10 mg total) by mouth daily.   fluticasone 50 MCG/ACT nasal spray Commonly known as: FLONASE SHAKE LIQUID AND USE 2 SPRAYS IN EACH NOSTRIL DAILY   furosemide 40 MG tablet Commonly known as: LASIX Take 1 tablet (40 mg total) by mouth daily.   gabapentin 300 MG capsule Commonly known as: NEURONTIN TAKE 1 CAPSULE(300 MG) BY MOUTH TWICE DAILY   guaiFENesin 600 MG 12 hr tablet Commonly known as: Mucinex Take 2 tablets (1,200 mg total) by mouth 2 (two) times daily as needed for cough or to loosen phlegm.   levothyroxine 25 MCG tablet Commonly known as: SYNTHROID TAKE 1 TABLET(25 MCG) BY MOUTH DAILY BEFORE BREAKFAST   lidocaine 5 % Commonly known as: Lidoderm Place 1 patch onto  the skin daily. Remove & Discard patch within 12 hours or as directed by MD   metoprolol tartrate 25 MG tablet Commonly known as: LOPRESSOR TAKE 1 TABLET(25 MG) BY MOUTH TWICE DAILY   OXYGEN Inhale 4 L into the lungs at bedtime.   pantoprazole 40 MG tablet Commonly known as: PROTONIX TAKE 1 TABLET(40 MG) BY MOUTH AT BEDTIME   potassium chloride 10 MEQ tablet Commonly known as: KLOR-CON Take 1 tablet (10 mEq total) by mouth daily.   pravastatin 40 MG tablet Commonly known as: PRAVACHOL MUST SCHEDULE FASTING LABS take 1 by mouth daily   Trelegy Ellipta 100-62.5-25 MCG/INH Aepb Generic drug: Fluticasone-Umeclidin-Vilant Inhale 1 puff into the lungs daily.   Vitamin D 1000 units capsule Take one tablet once daily        Signature:  Chesley Mires, MD [place] Pager - [phone] 09/23/2021, 11:00 AM

## 2021-09-23 NOTE — Patient Instructions (Signed)
Try using mucinex and albuterol more often to help with cough  Follow up in 4 months

## 2021-10-12 ENCOUNTER — Ambulatory Visit (INDEPENDENT_AMBULATORY_CARE_PROVIDER_SITE_OTHER): Payer: PPO | Admitting: Cardiovascular Disease

## 2021-10-12 ENCOUNTER — Encounter: Payer: Self-pay | Admitting: Cardiovascular Disease

## 2021-10-12 VITALS — Ht 66.0 in | Wt 177.0 lb

## 2021-10-12 DIAGNOSIS — I10 Essential (primary) hypertension: Secondary | ICD-10-CM

## 2021-10-12 DIAGNOSIS — I480 Paroxysmal atrial fibrillation: Secondary | ICD-10-CM | POA: Diagnosis not present

## 2021-10-12 DIAGNOSIS — E782 Mixed hyperlipidemia: Secondary | ICD-10-CM

## 2021-10-12 DIAGNOSIS — I5032 Chronic diastolic (congestive) heart failure: Secondary | ICD-10-CM | POA: Diagnosis not present

## 2021-10-12 NOTE — Progress Notes (Signed)
Virtual Visit via Telephone Note   This visit type was conducted due to national recommendations for restrictions regarding the COVID-19 Pandemic (e.g. social distancing) in an effort to limit this patient's exposure and mitigate transmission in our community.  Due to his co-morbid illnesses, this patient is at least at moderate risk for complications without adequate follow up.  This format is felt to be most appropriate for this patient at this time.  The patient did not have access to video technology/had technical difficulties with video requiring transitioning to audio format only (telephone).  All issues noted in this document were discussed and addressed.  No physical exam could be performed with this format.  Please refer to the patient's chart for his  consent to telehealth for [facility].   I connected with  Eric [name] on 10/12/21 by a video enabled telemedicine application and verified that I am speaking with the correct person using two identifiers. I am contacting the patient above from our cardiology clinic office or alternate office work station to their home, I discussed the limitations of evaluation and management by telemedicine. The patient expressed understanding and agreed to proceed.   Evaluation Performed:  Follow-up visit  Date:  10/12/2021   ID:  Eric [name], DOB 11/25/1932, MRN [id number]  Patient Location:  [address] [place] [address]   Provider location:   [facility], [place] office  PCP:  Lauree Chandler, NP  Cardiologist:  Arvid Right [facility]  Chief Complaint  Patient presents with   Follow-up    Would like to discuss CT of chest. "Doing well." Medications reviewed by the patient verbally.     History of Present Illness:    Eric [name] is a 87 y.o. 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 routine followup of his atrial fibrillation,  paroxysmal/persistent   Still mild cough On eliquis , metoprolol  Limited by weak legs and chronic shortness of breath   Walks with a walker  Stable TSH numbers off amiodarone and on low-dose thyroid supplement, TSH 1.6 down from in the 26s  Feels he is asymptomatic from the atrial fibrillation  CT scan chest reviewed with him on prior visit but he wanted to meet again today to talk about his CT  CT scan chest, results reviewed There is diffuse bronchial wall thickening in the bilateral lower lobes, as well as scattered bronchiolar plugging, bandlike scarring and dependent ground-glass. Findings are consistent with sequelae of infection or aspiration, which Schlee be ongoing. 2. Cardiomegaly and coronary artery disease. 3. Enlargement of the tubular ascending thoracic aorta, measuring up to 4.0 x 4.0 cm. 4. Enlargement of the main pulmonary artery measuring up to 3.6 cm in caliber, as can be seen in pulmonary hypertension.  Lab work reviewed Total chol 112, LDL 58   Other past medical history reviewed  hospital March 18-21 , 2020 Admitted for sepsis and acute on chronic hypoxic respiratory failure secondary to bronchitis and rhinovirus infection. Initially presented with fever, chills, cough and shortness of breath Sent home with prednisone   Event Monitor Avg HR of  58 bpm. Predominant underlying rhythm was Sinus Rhythm.  Second Degree AV Block-Mobitz I (Wenckebach) was present.  Isolated SVEs were rare (<1.0%), and no SVE Couplets or SVE Triplets were present. Isolated VEs were rare (<1.0%), and no VE Couplets  or VE Triplets were present. Patient triggered events were not associated with significant arrhythmia.   Echocardiogram 04/04/2017 with ejection fraction 55-60%, mildly dilated left atrium    Past Medical History:  Diagnosis Date   Anxiety    Arthritis    Atypical chest pain    a. 05/2017 MV: no ischemia, EF 79%.   Chest pain 10/20/2017   Chronic diastolic CHF (congestive heart failure) ([place])    a. 01/2011 Echo: EF 50-55%, gr1 DD, mild AI, nl RV fxn, mild TR/PR; b. 10/2017 Echo: EF 60-65%, mild LVH, gr2 DD.   DDD (degenerative disc disease), cervical    Depressive disorder, not elsewhere classified    Difficult intubation    Dysphagia, oral phase    Dyspnea    Edema    Gallstones    a. Symptomatic - s/p lap chole 05/2018.   History of DVT (deep vein thrombosis)    History of kidney stones    Hypertension    Hypoxemia    Impacted cerumen    Ischemic colitis ([place])    a. 02/2018 GIB - colonoscopy w/ isch colitis. Anticoagulation resumed.   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 a. 01/2011 in setting of post-op complications including aspiration pna;  b. CHA2DS2VASc = 4--> Amio/Eliquis.   Pneumonia 03/06/2003   Spinal stenosis, unspecified region other than cervical    Squamous cell carcinoma of skin of trunk, except scrotum    skin cancer of shoulder   Syncope    a. 10/2017-->Event monitor: RSR, rare PACs/PVCs.   Thoracic or lumbosacral neuritis or radiculitis, unspecified    Past Surgical History:  Procedure Laterality Date   CARDIOVERSION N/A 06/20/2017   Procedure: CARDIOVERSION;  Surgeon: [name], MD;  Location: [facility] ORS;  Service: Cardiovascular;  Laterality: N/A;   CATARACT  EXTRACTION W/ INTRAOCULAR LENS  IMPLANT, BILATERAL     CERVICAL FUSION  02/10/2011   CHOLECYSTECTOMY  05/24/2018   CHOLECYSTECTOMY N/A 05/24/2018   Procedure: LAPAROSCOPIC CHOLECYSTECTOMY;  Surgeon: Coralie Keens, MD;  Location: [facility];  Service: General;  Laterality: N/A;   COLONOSCOPY WITH PROPOFOL N/A 02/20/2018   Procedure: COLONOSCOPY WITH PROPOFOL;  Surgeon: Lucilla Lame, MD;  Location: [facility] ENDOSCOPY;  Service: Endoscopy;  Laterality: N/A;   history of abd ultrasound  11/01   fatty liver   MULTIPLE TOOTH EXTRACTIONS     ORIF FIBULA FRACTURE Left 01/06/2017   Procedure: OPEN REDUCTION INTERNAL FIXATION (ORIF) FIBULA FRACTURE DISTAL FIBULA;  Surgeon: Melrose Nakayama, MD;  Location: [facility];  Service: Orthopedics;  Laterality: Left;  Patient states has problems if he will have a tube in throat for Genera; Anesthesia     Allergies:   Morphine and related, Percocet [oxycodone-acetaminophen], and Valium   Social History   Tobacco Use   Smoking status: Former    Years: 1.00    Types: Cigarettes   Smokeless tobacco: Never  Tobacco comments:    stopped in 20's  Vaping Use   Vaping Use: Never used  Substance Use Topics   Alcohol use: No   Drug use: No     Current Outpatient Medications on File Prior to Visit  Medication Sig Dispense Refill   albuterol (PROVENTIL) (2.5 MG/3ML) 0.083% nebulizer solution Take 2.5 mg by nebulization every 6 (six) hours as needed for wheezing or shortness of breath.     apixaban (ELIQUIS) 5 MG TABS tablet Take one tablet by mouth twice daily 180 tablet 3   cetirizine (ZYRTEC) 10 MG tablet Take 1 tablet (10 mg total) by mouth daily. 90 tablet 3   Cholecalciferol (VITAMIN D) 1000 UNITS capsule Take one tablet once daily 90 capsule 3   fluticasone (FLONASE) 50 MCG/ACT nasal spray SHAKE LIQUID AND USE 2 SPRAYS IN EACH NOSTRIL DAILY 16 g 0   Fluticasone-Umeclidin-Vilant (TRELEGY ELLIPTA) 100-62.5-25 MCG/INH AEPB Inhale 1 puff into the lungs daily. 28 each 0    furosemide (LASIX) 40 MG tablet Take 1 tablet (40 mg total) by mouth daily. 90 tablet 3   gabapentin (NEURONTIN) 300 MG capsule TAKE 1 CAPSULE(300 MG) BY MOUTH TWICE DAILY 180 capsule 1   guaiFENesin (MUCINEX) 600 MG 12 hr tablet Take 2 tablets (1,200 mg total) by mouth 2 (two) times daily as needed for cough or to loosen phlegm.     levothyroxine (SYNTHROID) 25 MCG tablet TAKE 1 TABLET(25 MCG) BY MOUTH DAILY BEFORE BREAKFAST 90 tablet 1   lidocaine (LIDODERM) 5 % Place 1 patch onto the skin daily. Remove & Discard patch within 12 hours or as directed by MD 15 patch 0   metoprolol tartrate (LOPRESSOR) 25 MG tablet TAKE 1 TABLET(25 MG) BY MOUTH TWICE DAILY 180 tablet 3   OXYGEN Inhale 4 L into the lungs at bedtime.      pantoprazole (PROTONIX) 40 MG tablet TAKE 1 TABLET(40 MG) BY MOUTH AT BEDTIME 90 tablet 3   potassium chloride (KLOR-CON) 10 MEQ tablet Take 1 tablet (10 mEq total) by mouth daily. 90 tablet 3   pravastatin (PRAVACHOL) 40 MG tablet MUST SCHEDULE FASTING LABS take 1 by mouth daily 90 tablet 0   No current facility-administered medications on file prior to visit.     Family Hx: The patient's family history includes Atrial fibrillation in his brother; Cancer in an other family member; Heart disease in his brother; Other in his mother; Stroke in his father and paternal grandfather. There is no history of Prostate cancer, Kidney cancer, or Bladder Cancer.  ROS:   Please see the history of present illness.    Review of Systems  Constitutional: Negative.   HENT: Negative.    Respiratory:  Positive for cough.   Cardiovascular: Negative.   Gastrointestinal: Negative.   Musculoskeletal: Negative.   Neurological: Negative.   Psychiatric/Behavioral: Negative.    All other systems reviewed and are negative.    Labs/Other Tests and Data Reviewed:    Recent Labs: 08/13/2021: ALT 33; BUN 20; Creat 1.04; Hemoglobin 15.4; Platelets 175; Potassium 4.3; Sodium 142; TSH 1.62   Recent  Lipid Panel Lab Results  Component Value Date/Time   CHOL 112 08/13/2021 10:45 AM   CHOL 169 05/16/2016 10:35 AM   TRIG 125 08/13/2021 10:45 AM   HDL 33 (L) 08/13/2021 10:45 AM   HDL 37 (L) 05/16/2016 10:35 AM   CHOLHDL 3.4 08/13/2021 10:45 AM   LDLCALC 58 08/13/2021 10:45 AM   LDLDIRECT 97.4 11/29/2010 09:21 AM  Wt Readings from Last 3 Encounters:  10/12/21 177 lb (80.3 kg)  08/24/21 174 lb 8 oz (79.2 kg)  08/13/21 175 lb 3.2 oz (79.5 kg)     Exam:    Vital Signs: Vital signs [place] also be detailed in the HPI Ht 5\' 6"  (1.676 m)   Wt 177 lb (80.3 kg)   BMI 28.57 kg/m   Wt Readings from Last 3 Encounters:  10/12/21 177 lb (80.3 kg)  08/24/21 174 lb 8 oz (79.2 kg)  08/13/21 175 lb 3.2 oz (79.5 kg)   Temp Readings from Last 3 Encounters:  08/13/21 (!) 97.1 F (36.2 C)  12/31/20 (!) 97.1 F (36.2 C) (Temporal)  12/22/20 99 F (37.2 C)   BP Readings from Last 3 Encounters:  08/24/21 (!) 130/58  08/13/21 (!) 156/96  07/27/21 126/72   Pulse Readings from Last 3 Encounters:  08/24/21 (!) 56  08/13/21 (!) 53  07/27/21 (!) 52     Well nourished, well developed male in no acute distress. Constitutional:  oriented to person, place, and time. No distress.    ASSESSMENT & PLAN:    Problem List Items Addressed This Visit       Cardiology Problems   Mixed hyperlipidemia   Chronic heart failure with preserved ejection fraction (HCC)   HYPERTENSION, BENIGN ESSENTIAL   PAF (paroxysmal atrial fibrillation) (HCC) - Primary   Chronic diastolic CHF (congestive heart failure) (HCC)    PAF (paroxysmal atrial fibrillation) ([place]) Off amiodarone, coverted to atrial fibrillation, noted on prior clinic visit September 2022 Given asymptomatic, decision made for medical management/rate control Continue eliquis, metoprolol No change in clinical status on today's discussion, we will continue with rate control  Dilated ascending aorta Mildly dilated, no further work-up needed    Chronic heart failure with preserved ejection fraction (HCC) Continue Lasix 40 daily with potassium Stable renal function, could use extra Lasix if needed for leg swelling abdominal distention or worsening shortness of breath   Chronic respiratory distress/COPD Chronic cough in the morning consistent with chronic bronchitis No recent hospitalizations, followed by pulmonary, on Trelegy daily   Mixed hyperlipidemia Cholesterol is at goal on the current lipid regimen. No changes to the medications were made.     HYPERTENSION, BENIGN ESSENTIAL In the past well controlled, does not have a blood pressure cuff at home   Hypothyroidism TSH normal   COVID-19 Education: The signs and symptoms of COVID-19 were discussed with the patient and how to seek care for testing (follow up with PCP or arrange E-visit).  The importance of social distancing was discussed today.  Patient Risk:   After full review of this patients clinical status, I feel that they are at least moderate risk at this time.  Time:   Today, I have spent 25 minutes with the patient with telehealth technology discussing the cardiac and medical problems/diagnoses detailed above   Additional 10 min spent reviewing the chart prior to patient visit today   Medication Adjustments/Labs and Tests Ordered: Current medicines are reviewed at length with the patient today.  Concerns regarding medicines are outlined above.   Tests Ordered: No tests ordered   Medication Changes: No changes made    Signed, Ida Rogue, MD  [place] [facility] [address] [place] [address], [place], [place] [address]

## 2021-10-12 NOTE — Patient Instructions (Addendum)
Medication Instructions:  No changes  If you need a refill on your cardiac medications before your next appointment, please call your pharmacy.   Lab work: No new labs needed  Testing/Procedures: No new testing needed  Follow-Up: At [facility], you and your health needs are our priority.  As part of our continuing mission to provide you with exceptional heart care, we have created designated Provider Care Teams.  These Care Teams include your primary Cardiologist (physician) and Advanced Practice Providers (APPs -  Physician Assistants and Nurse Practitioners) who all work together to provide you with the care you need, when you need it.  You will need a follow up appointment in 12 months  Providers on your designated Care Team:   Murray Hodgkins, NP Christell Faith, PA- Cadence Kathlen Mody, [place]  COVID-19 Vaccine Information can be found at: [email or web address].co.uk For questions related to vaccine distribution or appointments, please email vaccine@[email or web address] .[email or web address] or call [phone].

## 2021-11-02 ENCOUNTER — Telehealth: Payer: Self-pay | Admitting: Pulmonary Disease

## 2021-11-02 NOTE — Telephone Encounter (Signed)
Unfortunately we do not have any samples of trelegy  Pt already filled out pt assistance and was denied  In the donut hole   Margie, do you have any samples today of the trelegy 100?  They are willing to drive to [place] if so  Please advise, and if not maybe we can ask MD if there is something we do have samples of that he could try until out of the donut hole

## 2021-11-02 NOTE — Telephone Encounter (Signed)
[facility] does not currently have samples of Trelegy 100, however we do have samples of Trelegy 200.  Dr. Halford Chessman, please advise if you would like to give samples of trelegy 200 or another inhaler sample to get pt by?

## 2021-11-02 NOTE — Telephone Encounter (Signed)
Okay to give trelegy 200 sample.

## 2021-11-02 NOTE — Telephone Encounter (Signed)
Pt is in the donut hole and is needing samples of trelegy.  Are there any samples in the office?  thanks

## 2021-11-03 MED ORDER — TRELEGY ELLIPTA 200-62.5-25 MCG/ACT IN AEPB: 1.0000 | INHALATION_SPRAY | Freq: Every day | RESPIRATORY_TRACT | 0 refills | Status: DC

## 2021-11-03 NOTE — Telephone Encounter (Signed)
Patient's daughter, Cindy(DPR) is aware that 2 samples of trelegy 200 have been placed up front for pickup.  Nothing further needed at this time.

## 2021-11-30 ENCOUNTER — Telehealth: Payer: Self-pay | Admitting: Pulmonary Disease

## 2021-11-30 ENCOUNTER — Other Ambulatory Visit: Payer: Self-pay

## 2021-11-30 MED ORDER — TRELEGY ELLIPTA 100-62.5-25 MCG/ACT IN AEPB: 1.0000 | INHALATION_SPRAY | Freq: Every day | RESPIRATORY_TRACT | 0 refills | Status: DC

## 2021-11-30 MED ORDER — CETIRIZINE HCL 10 MG PO TABS
10.0000 mg | ORAL_TABLET | Freq: Every day | ORAL | 3 refills | Status: DC
Start: 2021-11-30 — End: 2023-01-02

## 2021-11-30 NOTE — Telephone Encounter (Signed)
1 sample of the trelegy 100 up front for the pt to pick up. Spouse aware. Nothing further needed.

## 2021-12-20 ENCOUNTER — Telehealth: Payer: Self-pay | Admitting: Pulmonary Disease

## 2021-12-20 MED ORDER — TRELEGY ELLIPTA 200-62.5-25 MCG/ACT IN AEPB: 1.0000 | INHALATION_SPRAY | Freq: Every day | RESPIRATORY_TRACT | 5 refills | Status: DC

## 2021-12-20 NOTE — Telephone Encounter (Signed)
Rx for pt's Trelegy  has been sent to pharmacy for pt. Called and spoke with pt's daughter Jenny Reichmann letting her know this had been done and she verbalized understanding. Nothing further needed.

## 2022-01-14 ENCOUNTER — Other Ambulatory Visit: Payer: Self-pay | Admitting: Nurse Practitioner

## 2022-01-14 DIAGNOSIS — E039 Hypothyroidism, unspecified: Secondary | ICD-10-CM

## 2022-01-19 NOTE — Telephone Encounter (Signed)
Opened in error

## 2022-02-21 ENCOUNTER — Other Ambulatory Visit: Payer: Self-pay | Admitting: Nurse Practitioner

## 2022-02-21 DIAGNOSIS — E782 Mixed hyperlipidemia: Secondary | ICD-10-CM

## 2022-02-21 DIAGNOSIS — R7989 Other specified abnormal findings of blood chemistry: Secondary | ICD-10-CM

## 2022-03-02 ENCOUNTER — Other Ambulatory Visit: Payer: Self-pay | Admitting: Nurse Practitioner

## 2022-03-02 DIAGNOSIS — G8929 Other chronic pain: Secondary | ICD-10-CM

## 2022-03-11 ENCOUNTER — Ambulatory Visit (INDEPENDENT_AMBULATORY_CARE_PROVIDER_SITE_OTHER): Payer: PPO | Admitting: Nurse Practitioner

## 2022-03-11 ENCOUNTER — Encounter: Payer: Self-pay | Admitting: Nurse Practitioner

## 2022-03-11 VITALS — BP 110/70 | HR 61 | Temp 97.1°F | Ht 66.0 in | Wt 175.0 lb

## 2022-03-11 DIAGNOSIS — I5032 Chronic diastolic (congestive) heart failure: Secondary | ICD-10-CM | POA: Diagnosis not present

## 2022-03-11 DIAGNOSIS — I10 Essential (primary) hypertension: Secondary | ICD-10-CM | POA: Diagnosis not present

## 2022-03-11 DIAGNOSIS — M48062 Spinal stenosis, lumbar region with neurogenic claudication: Secondary | ICD-10-CM

## 2022-03-11 DIAGNOSIS — E039 Hypothyroidism, unspecified: Secondary | ICD-10-CM | POA: Diagnosis not present

## 2022-03-11 DIAGNOSIS — E782 Mixed hyperlipidemia: Secondary | ICD-10-CM

## 2022-03-11 DIAGNOSIS — I480 Paroxysmal atrial fibrillation: Secondary | ICD-10-CM

## 2022-03-11 DIAGNOSIS — J449 Chronic obstructive pulmonary disease, unspecified: Secondary | ICD-10-CM | POA: Diagnosis not present

## 2022-03-11 DIAGNOSIS — G3281 Cerebellar ataxia in diseases classified elsewhere: Secondary | ICD-10-CM | POA: Diagnosis not present

## 2022-03-11 DIAGNOSIS — J4489 Other specified chronic obstructive pulmonary disease: Secondary | ICD-10-CM

## 2022-03-11 DIAGNOSIS — J9611 Chronic respiratory failure with hypoxia: Secondary | ICD-10-CM | POA: Diagnosis not present

## 2022-03-11 DIAGNOSIS — N1832 Chronic kidney disease, stage 3b: Secondary | ICD-10-CM | POA: Diagnosis not present

## 2022-03-11 NOTE — Progress Notes (Signed)
? ? ?Careteam: ?Patient Care Team: ?Lauree Chandler, NP as PCP - General (Geriatric Medicine) ?[name], MD as PCP - Cardiology (Cardiology) ?[name], MD as Consulting Physician (Cardiology) ?Normajean Glasgow, MD as Attending Physician (Physical Medicine and Rehabilitation) ?Phylliss Bob, MD as Consulting Physician (Orthopedic Surgery) ?Wilhelmina Mcardle, MD (Inactive) as Consulting Physician (Pulmonary Disease) ? ?PLACE OF SERVICE:  ?[facility]  ?Advanced Directive information ?Does Patient Have a Medical Advance Directive?: Yes, Type of Advance Directive: [place];Living will, Does patient want to make changes to medical advance directive?: No - Patient declined ? ?Allergies  ?Allergen Reactions  ? Morphine And Related Shortness Of Breath  ? Percocet [Oxycodone-Acetaminophen] Shortness Of Breath  ? Valium Shortness Of Breath  ? ? ?Chief Complaint  ?Patient presents with  ? Medical Management of Chronic Issues  ?  6 month follow-up. Discuss need for shingrix and additional covid boosters or post pone if patient refuses. Patient denies receiving any vaccines since last visit. NCIR verified. Discuss Protonix, off x 4 months due to no refill. Protonix helps with cough and patient still will   ? ? ? ?HPI: Patient is a 88 y.o. male for routine follow up ? ?Has been off protonix and has not notice any changes in GERD, cough, shortness of breath, indigestion.  ? ?Hypothyroid- stable on last lab, will folow up today ? ?Using O2 at night.  ? ?Right leg wants to give way- this is chronic. When he was going to back doctor told them he had a bad leg. Chronic back pain stable.  ? ?No issues this winter- has not gotten bad sick which has been awesome.  ? ?CHF- stable, sleeps propped up. No worsening LE edema. Or chstpain.  ? ?Review of Systems:  ?Review of Systems  ?Constitutional:  Negative for chills, fever and weight loss.  ?HENT:  Negative for tinnitus.   ?Respiratory:  Positive for  shortness of breath. Negative for cough and sputum production.   ?Cardiovascular:  Negative for chest pain, palpitations and leg swelling.  ?Gastrointestinal:  Negative for abdominal pain, constipation, diarrhea and heartburn.  ?Genitourinary:  Positive for frequency. Negative for dysuria and urgency.  ?Musculoskeletal:  Positive for back pain. Negative for falls, joint pain and myalgias.  ?Skin: Negative.   ?Neurological:  Negative for dizziness and headaches.  ?Psychiatric/Behavioral:  Negative for depression and memory loss. The patient does not have insomnia.   ? ?Past Medical History:  ?Diagnosis Date  ? Anxiety   ? Arthritis   ? Atypical chest pain   ? a. 05/2017 MV: no ischemia, EF 79%.  ? Chest pain 10/20/2017  ? Chronic diastolic CHF (congestive heart failure) ([place])   ? a. 01/2011 Echo: EF 50-55%, gr1 DD, mild AI, nl RV fxn, mild TR/PR; b. 10/2017 Echo: EF 60-65%, mild LVH, gr2 DD.  ? DDD (degenerative disc disease), cervical   ? Depressive disorder, not elsewhere classified   ? Difficult intubation   ? Dysphagia, oral phase   ? Dyspnea   ? Edema   ? Gallstones   ? a. Symptomatic - s/p lap chole 05/2018.  ? History of DVT (deep vein thrombosis)   ? History of kidney stones   ? Hypertension   ? Hypoxemia   ? Impacted cerumen   ? Ischemic colitis ([place])   ? a. 02/2018 GIB - colonoscopy w/ isch colitis. Anticoagulation resumed.  ? Long term current use of anticoagulant 03/02/2011  ? LOW BACK PAIN SYNDROME 03/17/2009  ?  Qualifier: Diagnosis of  By: Council Mechanic MD, Hilaria Ota   ? Mixed hyperlipidemia   ? Nonunion of foot fracture   ? left distal fibula non-union  ? Other myelopathy   ? Pain in limb   ? Palpitations   ? Paroxysmal Atrial Fibrillation ([place])   ? a. a. 01/2011 in setting of post-op complications including aspiration pna;  b. CHA2DS2VASc = 4--> Amio/Eliquis.  ? Pneumonia 03/06/2003  ? Spinal stenosis, unspecified region other than cervical   ? Squamous cell carcinoma of skin of trunk, except scrotum   ? skin  cancer of shoulder  ? Syncope   ? a. 10/2017-->Event monitor: RSR, rare PACs/PVCs.  ? Thoracic or lumbosacral neuritis or radiculitis, unspecified   ? ?Past Surgical History:  ?Procedure Laterality Date  ? CARDIOVERSION N/A 06/20/2017  ? Procedure: CARDIOVERSION;  Surgeon: [name], MD;  Location: [facility] ORS;  Service: Cardiovascular;  Laterality: N/A;  ? CATARACT EXTRACTION W/ INTRAOCULAR LENS  IMPLANT, BILATERAL    ? CERVICAL FUSION  02/10/2011  ? CHOLECYSTECTOMY  05/24/2018  ? CHOLECYSTECTOMY N/A 05/24/2018  ? Procedure: LAPAROSCOPIC CHOLECYSTECTOMY;  Surgeon: Coralie Keens, MD;  Location: [place];  Service: General;  Laterality: N/A;  ? COLONOSCOPY WITH PROPOFOL N/A 02/20/2018  ? Procedure: COLONOSCOPY WITH PROPOFOL;  Surgeon: Lucilla Lame, MD;  Location: [facility] END[facility]COPY;  Service: Endoscopy;  Laterality: N/A;  ? history of abd ultrasound  11/01  ? fatty liver  ? MULTIPLE TOOTH EXTRACTIONS    ? ORIF FIBULA FRACTURE Left 01/06/2017  ? Procedure: OPEN REDUCTION INTERNAL FIXATION (ORIF) FIBULA FRACTURE DISTAL FIBULA;  Surgeon: Melrose Nakayama, MD;  Location: [place];  Service: Orthopedics;  Laterality: Left;  Patient states has problems if he will have a tube in throat for Genera; Anesthesia  ? ?Social History: ?  reports that he has quit smoking. His smoking use included cigarettes. He has never used smokeless tobacco. He reports that he does not drink alcohol and does not use drugs. ? ?Family History  ?Problem Relation Age of Onset  ? Stroke Father   ? Atrial fibrillation Brother   ?     on coumadin  ? Heart disease Brother   ?     AFib- coumadin  ? Stroke Paternal Grandfather   ? Other Mother   ?     hemorrhage  ? Cancer Other   ?     colon cancer at early age  ? Prostate cancer Neg Hx   ? Kidney cancer Neg Hx   ? Bladder Cancer Neg Hx   ? ? ?Medications: ?Patient's Medications  ?New Prescriptions  ? No medications on file  ?Previous Medications  ? ALBUTEROL (PROVENTIL) (2.5 MG/3ML) 0.083% NEBULIZER SOLUTION     Take 2.5 mg by nebulization every 6 (six) hours as needed for wheezing or shortness of breath.  ? APIXABAN (ELIQUIS) 5 MG TABS TABLET    Take one tablet by mouth twice daily  ? CETIRIZINE (ZYRTEC) 10 MG TABLET    Take 1 tablet (10 mg total) by mouth daily.  ? CHOLECALCIFEROL (VITAMIN D) 1000 UNITS CAPSULE    Take one tablet once daily  ? FLUTICASONE (FLONASE) 50 MCG/ACT NASAL SPRAY    SHAKE LIQUID AND USE 2 SPRAYS IN EACH NOSTRIL DAILY  ? FLUTICASONE-UMECLIDIN-VILANT (TRELEGY ELLIPTA) 200-62.5-25 MCG/ACT AEPB    Inhale 1 puff into the lungs daily.  ? FUROSEMIDE (LASIX) 40 MG TABLET    Take 1 tablet (40 mg total) by mouth daily.  ? GABAPENTIN (NEURONTIN) 300  MG CAPSULE    TAKE 1 CAPSULE(300 MG) BY MOUTH TWICE DAILY  ? GUAIFENESIN (MUCINEX) 600 MG 12 HR TABLET    Take 2 tablets (1,200 mg total) by mouth 2 (two) times daily as needed for cough or to loosen phlegm.  ? LEVOTHYROXINE (SYNTHROID) 25 MCG TABLET    TAKE 1 TABLET(25 MCG) BY MOUTH DAILY BEFORE BREAKFAST  ? LIDOCAINE (LIDODERM) 5 %    Place 1 patch onto the skin daily. Remove & Discard patch within 12 hours or as directed by MD  ? METOPROLOL TARTRATE (LOPRESSOR) 25 MG TABLET    TAKE 1 TABLET(25 MG) BY MOUTH TWICE DAILY  ? OXYGEN    Inhale 4 L into the lungs at bedtime.   ? PANTOPRAZOLE (PROTONIX) 40 MG TABLET    TAKE 1 TABLET(40 MG) BY MOUTH AT BEDTIME  ? POTASSIUM CHLORIDE (KLOR-CON) 10 MEQ TABLET    Take 1 tablet (10 mEq total) by mouth daily.  ? PRAVASTATIN (PRAVACHOL) 40 MG TABLET    TAKE 1 TABLET BY MOUTH EVERY DAY MUST SCHEDULE FASTING LABS  ?Modified Medications  ? No medications on file  ?Discontinued Medications  ? No medications on file  ? ? ?Physical Exam: ? ?Vitals:  ? 03/11/22 1020  ?BP: 110/70  ?Pulse: 61  ?Temp: (!) 97.1 ?F (36.2 ?C)  ?TempSrc: Temporal  ?SpO2: 94%  ?Weight: 175 lb (79.4 kg)  ?Height: '5\' 6"'$  (1.676 m)  ? ?Body mass index is 28.25 kg/m?. ?Wt Readings from Last 3 Encounters:  ?03/11/22 175 lb (79.4 kg)  ?10/12/21 177 lb (80.3 kg)   ?08/24/21 174 lb 8 oz (79.2 kg)  ? ? ?Physical Exam ?Constitutional:   ?   General: He is not in acute distress. ?   Appearance: He is well-developed. He is not diaphoretic.  ?HENT:  ?   Head: Normocephalic and

## 2022-03-12 LAB — COMPLETE METABOLIC PANEL WITH GFR
AG Ratio: 2 (calc) (ref 1.0–2.5)
ALT: 48 U/L — ABNORMAL HIGH (ref 9–46)
AST: 44 U/L — ABNORMAL HIGH (ref 10–35)
Albumin: 4.9 g/dL (ref 3.6–5.1)
Alkaline phosphatase (APISO): 65 U/L (ref 35–144)
BUN: 25 mg/dL (ref 7–25)
CO2: 28 mmol/L (ref 20–32)
Calcium: 9.9 mg/dL (ref 8.6–10.3)
Chloride: 104 mmol/L (ref 98–110)
Creat: 1.21 mg/dL (ref 0.70–1.22)
Globulin: 2.5 g/dL (calc) (ref 1.9–3.7)
Glucose, Bld: 89 mg/dL (ref 65–99)
Potassium: 4.9 mmol/L (ref 3.5–5.3)
Sodium: 142 mmol/L (ref 135–146)
Total Bilirubin: 1.4 mg/dL — ABNORMAL HIGH (ref 0.2–1.2)
Total Protein: 7.4 g/dL (ref 6.1–8.1)
eGFR: 58 mL/min/{1.73_m2} — ABNORMAL LOW (ref >=60)

## 2022-03-12 LAB — CBC WITH DIFFERENTIAL/PLATELET
Absolute Monocytes: 486 cells/uL (ref 200–950)
Basophils Absolute: 58 cells/uL (ref 0–200)
Basophils Relative: 0.9 %
Eosinophils Absolute: 90 cells/uL (ref 15–500)
Eosinophils Relative: 1.4 %
HCT: 52 % — ABNORMAL HIGH (ref 38.5–50.0)
Hemoglobin: 17.1 g/dL (ref 13.2–17.1)
Lymphs Abs: 2029 cells/uL (ref 850–3900)
MCH: 31.3 pg (ref 27.0–33.0)
MCHC: 32.9 g/dL (ref 32.0–36.0)
MCV: 95.1 fL (ref 80.0–100.0)
MPV: 10.5 fL (ref 7.5–12.5)
Monocytes Relative: 7.6 %
Neutro Abs: 3738 cells/uL (ref 1500–7800)
Neutrophils Relative %: 58.4 %
Platelets: 189 10*3/uL (ref 140–400)
RBC: 5.47 10*6/uL (ref 4.20–5.80)
RDW: 12.2 % (ref 11.0–15.0)
Total Lymphocyte: 31.7 %
WBC: 6.4 10*3/uL (ref 3.8–10.8)

## 2022-03-12 LAB — LIPID PANEL
Cholesterol: 125 mg/dL (ref <200)
HDL: 36 mg/dL — ABNORMAL LOW (ref >=40)
LDL Cholesterol (Calc): 69 mg/dL (calc)
Non-HDL Cholesterol (Calc): 89 mg/dL (calc) (ref <130)
Total CHOL/HDL Ratio: 3.5 (calc) (ref <5.0)
Triglycerides: 122 mg/dL (ref <150)

## 2022-03-12 LAB — TSH: TSH: 1.66 mIU/L (ref 0.40–4.50)

## 2022-03-24 ENCOUNTER — Ambulatory Visit (INDEPENDENT_AMBULATORY_CARE_PROVIDER_SITE_OTHER): Payer: PPO | Admitting: Nurse Practitioner

## 2022-03-24 ENCOUNTER — Encounter: Payer: Self-pay | Admitting: Nurse Practitioner

## 2022-03-24 ENCOUNTER — Telehealth: Payer: Self-pay

## 2022-03-24 DIAGNOSIS — Z0000 Encounter for general adult medical examination without abnormal findings: Secondary | ICD-10-CM

## 2022-03-24 NOTE — Progress Notes (Signed)
This service is provided via telemedicine ? ?No vital signs collected/recorded due to the encounter was a telemedicine visit.  ? ?Location of patient (ex: home, work):  Home ? ?Patient consents to a telephone visit:  Yes, see encounter dated 03/24/2022 ? ?Location of the provider (ex: office, home):  [facility] ? ?Name of any referring provider:  N/A ? ?Names of all persons participating in the telemedicine service and their role in the encounter:  Sherrie Mustache, Nurse Practitioner, Carroll Kinds, CMA, and patient.  ? ?Time spent on call:  12 minutes with medical assistant ? ?

## 2022-03-24 NOTE — Patient Instructions (Signed)
[name] , ?Thank you for taking time to come for your Medicare Wellness Visit. I appreciate your ongoing commitment to your health goals. Please review the following plan we discussed and let me know if I can assist you in the future.  ? ?Screening recommendations/referrals: ?Colonoscopy aged out ?Recommended yearly ophthalmology/optometry visit for glaucoma screening and checkup ?Recommended yearly dental visit for hygiene and checkup ? ?Vaccinations: ?Influenza vaccine up to date ?Pneumococcal vaccine up to date ?Tdap vaccine up to date ?Shingles vaccine DUE- recommend to get at your local pharmacy    ?   ? ?Advanced directives: on file ? ?Conditions/risks identified: advance age, fall risk ? ?Next appointment: yearly for AWV ? ?Preventive Care 47 Years and Older, Male ?Preventive care refers to lifestyle choices and visits with your health care provider that can promote health and wellness. ?What does preventive care include? ?A yearly physical exam. This is also called an annual well check. ?Dental exams once or twice a year. ?Routine eye exams. Ask your health care provider how often you should have your eyes checked. ?Personal lifestyle choices, including: ?Daily care of your teeth and gums. ?Regular physical activity. ?Eating a healthy diet. ?Avoiding tobacco and drug use. ?Limiting alcohol use. ?Practicing safe sex. ?Taking low doses of aspirin every day. ?Taking vitamin and mineral supplements as recommended by your health care provider. ?What happens during an annual well check? ?The services and screenings done by your health care provider during your annual well check will depend on your age, overall health, lifestyle risk factors, and family history of disease. ?Counseling  ?Your health care provider Morriss ask you questions about your: ?Alcohol use. ?Tobacco use. ?Drug use. ?Emotional well-being. ?Home and relationship well-being. ?Sexual activity. ?Eating habits. ?History of falls. ?Memory and ability to  understand (cognition). ?Work and work Statistician. ?Screening  ?You Barfuss have the following tests or measurements: ?Height, weight, and BMI. ?Blood pressure. ?Lipid and cholesterol levels. These Schoenfelder be checked every 5 years, or more frequently if you are over 39 years old. ?Skin check. ?Lung cancer screening. You Losito have this screening every year starting at age 93 if you have a 30-pack-year history of smoking and currently smoke or have quit within the past 15 years. ?Fecal occult blood test (FOBT) of the stool. You Hosack have this test every year starting at age 49. ?Flexible sigmoidoscopy or colonoscopy. You Oros have a sigmoidoscopy every 5 years or a colonoscopy every 10 years starting at age 61. ?Prostate cancer screening. Recommendations will vary depending on your family history and other risks. ?Hepatitis C blood test. ?Hepatitis B blood test. ?Sexually transmitted disease (STD) testing. ?Diabetes screening. This is done by checking your blood sugar (glucose) after you have not eaten for a while (fasting). You Knust have this done every 1-3 years. ?Abdominal aortic aneurysm (AAA) screening. You Ruehl need this if you are a current or former smoker. ?Osteoporosis. You Hubbs be screened starting at age 88 if you are at high risk. ?Talk with your health care provider about your test results, treatment options, and if necessary, the need for more tests. ?Vaccines  ?Your health care provider Hueber recommend certain vaccines, such as: ?Influenza vaccine. This is recommended every year. ?Tetanus, diphtheria, and acellular pertussis (Tdap, Td) vaccine. You Claudio need a Td booster every 10 years. ?Zoster vaccine. You Bruhn need this after age 24. ?Pneumococcal 13-valent conjugate (PCV13) vaccine. One dose is recommended after age 1. ?Pneumococcal polysaccharide (PPSV23) vaccine. One dose is recommended after age 15. ?  Talk to your health care provider about which screenings and vaccines you need and how often you need them. ?This  information is not intended to replace advice given to you by your health care provider. Make sure you discuss any questions you have with your health care provider. ?Document Released: 12/25/2015 Document Revised: 08/17/2016 Document Reviewed: 09/29/2015 ?Elsevier Interactive Patient Education ? 2017 [place]. ? ?Fall Prevention in the Home ?Falls can cause injuries. They can happen to people of all ages. There are many things you can do to make your home safe and to help prevent falls. ?What can I do on the outside of my home? ?Regularly fix the edges of walkways and driveways and fix any cracks. ?Remove anything that might make you trip as you walk through a door, such as a raised step or threshold. ?Trim any bushes or trees on the path to your home. ?Use bright outdoor lighting. ?Clear any walking paths of anything that might make someone trip, such as rocks or tools. ?Regularly check to see if handrails are loose or broken. Make sure that both sides of any steps have handrails. ?Any raised decks and porches should have guardrails on the edges. ?Have any leaves, snow, or ice cleared regularly. ?Use sand or salt on walking paths during winter. ?Clean up any spills in your garage right away. This includes oil or grease spills. ?What can I do in the bathroom? ?Use night lights. ?Install grab bars by the toilet and in the tub and shower. Do not use towel bars as grab bars. ?Use non-skid mats or decals in the tub or shower. ?If you need to sit down in the shower, use a plastic, non-slip stool. ?Keep the floor dry. Clean up any water that spills on the floor as soon as it happens. ?Remove soap buildup in the tub or shower regularly. ?Attach bath mats securely with double-sided non-slip rug tape. ?Do not have throw rugs and other things on the floor that can make you trip. ?What can I do in the bedroom? ?Use night lights. ?Make sure that you have a light by your bed that is easy to reach. ?Do not use any sheets or  blankets that are too big for your bed. They should not hang down onto the floor. ?Have a firm chair that has side arms. You can use this for support while you get dressed. ?Do not have throw rugs and other things on the floor that can make you trip. ?What can I do in the kitchen? ?Clean up any spills right away. ?Avoid walking on wet floors. ?Keep items that you use a lot in easy-to-reach places. ?If you need to reach something above you, use a strong step stool that has a grab bar. ?Keep electrical cords out of the way. ?Do not use floor polish or wax that makes floors slippery. If you must use wax, use non-skid floor wax. ?Do not have throw rugs and other things on the floor that can make you trip. ?What can I do with my stairs? ?Do not leave any items on the stairs. ?Make sure that there are handrails on both sides of the stairs and use them. Fix handrails that are broken or loose. Make sure that handrails are as long as the stairways. ?Check any carpeting to make sure that it is firmly attached to the stairs. Fix any carpet that is loose or worn. ?Avoid having throw rugs at the top or bottom of the stairs. If you do have throw  rugs, attach them to the floor with carpet tape. ?Make sure that you have a light switch at the top of the stairs and the bottom of the stairs. If you do not have them, ask someone to add them for you. ?What else can I do to help prevent falls? ?Wear shoes that: ?Do not have high heels. ?Have rubber bottoms. ?Are comfortable and fit you well. ?Are closed at the toe. Do not wear sandals. ?If you use a stepladder: ?Make sure that it is fully opened. Do not climb a closed stepladder. ?Make sure that both sides of the stepladder are locked into place. ?Ask someone to hold it for you, if possible. ?Clearly mark and make sure that you can see: ?Any grab bars or handrails. ?First and last steps. ?Where the edge of each step is. ?Use tools that help you move around (mobility aids) if they are  needed. These include: ?Canes. ?Walkers. ?Scooters. ?Crutches. ?Turn on the lights when you go into a dark area. Replace any light bulbs as soon as they burn out. ?Set up your furniture so you have a c

## 2022-03-24 NOTE — Telephone Encounter (Signed)
Mr. ollie, esty are scheduled for a virtual visit with your provider today.   ? ?Just as we do with appointments in the office, we must obtain your consent to participate.  Your consent will be active for this visit and any virtual visit you [name] have with one of our providers in the next 365 days.   ? ?If you have a MyChart account, I can also send a copy of this consent to you electronically.  All virtual visits are billed to your insurance company just like a traditional visit in the office.  As this is a virtual visit, video technology does not allow for your provider to perform a traditional examination.  This [name] limit your provider's ability to fully assess your condition.  If your provider identifies any concerns that need to be evaluated in person or the need to arrange testing such as labs, EKG, etc, we will make arrangements to do so.   ? ?Although advances in technology are sophisticated, we cannot ensure that it will always work on either your end or our end.  If the connection with a video visit is poor, we [name] have to switch to a telephone visit.  With either a video or telephone visit, we are not always able to ensure that we have a secure connection.   I need to obtain your verbal consent now.   Are you willing to proceed with your visit today?  ? ?[name] has provided verbal consent on 03/24/2022 for a virtual visit (video or telephone). ? ? ?Carroll Kinds, CMA ?03/24/2022  10:01 AM ?  ?

## 2022-03-24 NOTE — Progress Notes (Signed)
? ?Subjective:  ? Eric [name] is a 88 y.o. male who presents for Medicare Annual/Subsequent preventive examination. ? ?Review of Systems    ? ?Cardiac Risk Factors include: sedentary lifestyle;advanced age (>76mn, >>45women);dyslipidemia;hypertension;male gender ? ?   ?Objective:  ?  ?There were no vitals filed for this visit. ?There is no height or weight on file to calculate BMI. ? ? ?  03/24/2022  ?  9:55 AM 03/11/2022  ? 10:29 AM 08/13/2021  ? 10:22 AM 03/18/2021  ?  9:54 AM 12/31/2020  ? 10:50 AM 12/28/2020  ? 10:29 AM 11/02/2020  ? 11:00 AM  ?Advanced Directives  ?Does Patient Have a Medical Advance Directive? Yes Yes No Yes   Yes  ?Type of AParamedicof AHaysLiving will HDepoe BayLiving will  HBuckinghamLiving will;Out of facility DNR (pink MOST or yellow form) HFre[place]Living will HFreetownLiving will HF[place]ontLiving will;Out of facility DNR (pink MOST or yellow form)  ?Does patient want to make changes to medical advance directive? No - Patient declined No - Patient declined  No - Patient declined No - Patient declined No - Patient declined No - Patient declined  ?Copy of [place]in Chart? No - copy requested No - copy requested  No - copy requested No - copy requested No - copy requested No - copy requested  ?Would patient like information on creating a medical advance directive?   No - Patient declined      ? ? ?Current Medications (verified) ?Outpatient Encounter Medications as of 03/24/2022  ?Medication Sig  ? albuterol (PROVENTIL) (2.5 MG/3ML) 0.083% nebulizer solution Take 2.5 mg by nebulization every 6 (six) hours as needed for wheezing or shortness of breath.  ? apixaban (ELIQUIS) 5 MG TABS tablet Take one tablet by mouth twice daily  ? cetirizine (ZYRTEC) 10 MG tablet Take 1 tablet (10 mg total) by mouth daily.  ? Cholecalciferol (VITAMIN D) 1000 UNITS capsule  Take one tablet once daily  ? fluticasone (FLONASE) 50 MCG/ACT nasal spray SHAKE LIQUID AND USE 2 SPRAYS IN EACH NOSTRIL DAILY  ? Fluticasone-Umeclidin-Vilant (TRELEGY ELLIPTA) 200-62.5-25 MCG/ACT AEPB Inhale 1 puff into the lungs daily.  ? furosemide (LASIX) 40 MG tablet Take 1 tablet (40 mg total) by mouth daily.  ? gabapentin (NEURONTIN) 300 MG capsule TAKE 1 CAPSULE(300 MG) BY MOUTH TWICE DAILY  ? guaiFENesin (MUCINEX) 600 MG 12 hr tablet Take 2 tablets (1,200 mg total) by mouth 2 (two) times daily as needed for cough or to loosen phlegm.  ? levothyroxine (SYNTHROID) 25 MCG tablet TAKE 1 TABLET(25 MCG) BY MOUTH DAILY BEFORE BREAKFAST  ? lidocaine (LIDODERM) 5 % Place 1 patch onto the skin daily. Remove & Discard patch within 12 hours or as directed by MD  ? metoprolol tartrate (LOPRESSOR) 25 MG tablet TAKE 1 TABLET(25 MG) BY MOUTH TWICE DAILY  ? OXYGEN Inhale 4 L into the lungs at bedtime.   ? pantoprazole (PROTONIX) 40 MG tablet TAKE 1 TABLET(40 MG) BY MOUTH AT BEDTIME  ? potassium chloride (KLOR-CON) 10 MEQ tablet Take 1 tablet (10 mEq total) by mouth daily.  ? pravastatin (PRAVACHOL) 40 MG tablet TAKE 1 TABLET BY MOUTH EVERY DAY MUST SCHEDULE FASTING LABS  ? ?No facility-administered encounter medications on file as of 03/24/2022.  ? ? ?Allergies (verified) ?Morphine and related, Percocet [oxycodone-acetaminophen], and Valium  ? ?History: ?Past Medical History:  ?Diagnosis Date  ? Anxiety   ?  Arthritis   ? Atypical chest pain   ? a. 05/2017 MV: no ischemia, EF 79%.  ? Chest pain 10/20/2017  ? Chronic diastolic CHF (congestive heart failure) ([place])   ? a. 01/2011 Echo: EF 50-55%, gr1 DD, mild AI, nl RV fxn, mild TR/PR; b. 10/2017 Echo: EF 60-65%, mild LVH, gr2 DD.  ? DDD (degenerative disc disease), cervical   ? Depressive disorder, not elsewhere classified   ? Difficult intubation   ? Dysphagia, oral phase   ? Dyspnea   ? Edema   ? Gallstones   ? a. Symptomatic - s/p lap chole 05/2018.  ? History of DVT (deep vein  thrombosis)   ? History of kidney stones   ? Hypertension   ? Hypoxemia   ? Impacted cerumen   ? Ischemic colitis ([place])   ? a. 02/2018 GIB - colonoscopy w/ isch colitis. Anticoagulation resumed.  ? Long term current use of anticoagulant 03/02/2011  ? LOW BACK PAIN SYNDROME 03/17/2009  ? Qualifier: Diagnosis of  By: Council Mechanic MD, Hilaria Ota   ? Mixed hyperlipidemia   ? Nonunion of foot fracture   ? left distal fibula non-union  ? Other myelopathy   ? Pain in limb   ? Palpitations   ? Paroxysmal Atrial Fibrillation ([place])   ? a. a. 01/2011 in setting of post-op complications including aspiration pna;  b. CHA2DS2VASc = 4--> Amio/Eliquis.  ? Pneumonia 03/06/2003  ? Spinal stenosis, unspecified region other than cervical   ? Squamous cell carcinoma of skin of trunk, except scrotum   ? skin cancer of shoulder  ? Syncope   ? a. 10/2017-->Event monitor: RSR, rare PACs/PVCs.  ? Thoracic or lumbosacral neuritis or radiculitis, unspecified   ? ?Past Surgical History:  ?Procedure Laterality Date  ? CARDIOVERSION N/A 06/20/2017  ? Procedure: CARDIOVERSION;  Surgeon: [name], MD;  Location: [facility] ORS;  Service: Cardiovascular;  Laterality: N/A;  ? CATARACT EXTRACTION W/ INTRAOCULAR LENS  IMPLANT, BILATERAL    ? CERVICAL FUSION  02/10/2011  ? CHOLECYSTECTOMY  05/24/2018  ? CHOLECYSTECTOMY N/A 05/24/2018  ? Procedure: LAPAROSCOPIC CHOLECYSTECTOMY;  Surgeon: Coralie Keens, MD;  Location: [place];  Service: General;  Laterality: N/A;  ? COLONOSCOPY WITH PROPOFOL N/A 02/20/2018  ? Procedure: COLONOSCOPY WITH PROPOFOL;  Surgeon: Lucilla Lame, MD;  Location[facility] ENDOSCOPY;  Service: Endoscopy;  Laterality: N/A;  ? history of abd ultrasound  11/01  ? fatty liver  ? MULTIPLE TOOTH EXTRACTIONS    ? ORIF FIBULA FRACTURE Left 01/06/2017  ? Procedure: OPEN REDUCTION INTERNAL FIXATION (ORIF) FIBULA FRACTURE DISTAL FIBULA;  Surgeon: Melrose Nakayama, MD;  Location: [place];  Service: Orthopedics;  Laterality: Left;  Patient states has problems if  he will have a tube in throat for Genera; Anesthesia  ? ?Family History  ?Problem Relation Age of Onset  ? Stroke Father   ? Atrial fibrillation Brother   ?     on coumadin  ? Heart disease Brother   ?     AFib- coumadin  ? Stroke Paternal Grandfather   ? Other Mother   ?     hemorrhage  ? Cancer Other   ?     colon cancer at early age  ? Prostate cancer Neg Hx   ? Kidney cancer Neg Hx   ? Bladder Cancer Neg Hx   ? ?Social History  ? ?Socioeconomic History  ? Marital status: Married  ?  Spouse name: Not on file  ? Number of children: 2  ?  Years of education: 11  ? Highest education level: High school graduate  ?Occupational History  ? Occupation: Retired 2006  ?  Employer: RETIRED  ?  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 Smoking status: Former  ?  Years: 1.00  ?  Types: Cigarettes  ? Smokeless tobacco: Never  ? Tobacco comments:  ?  stopped in 20's  ?Vaping Use  ? Vaping Use: Never used  ?Substance and Sexual Activity  ? Alcohol use: No  ? Drug use: No  ? Sexual activity: Not Currently  ?Other Topics Concern  ? Not on file  ?Social History Narrative  ? Lives at home with his wife.  ? Left-handed (due to arthritis, he uses his right hand more now).  ? Caffeine use: 2 cups per day.  ? ?Social Determinants of Health  ? ?Financial Resource Strain: Not on file  ?Food Insecurity: Not on file  ?Transportation Needs: Not on file  ?Physical Activity: Not on file  ?Stress: Not on file  ?Social Connections: Not on file  ? ? ?Tobacco Counseling ?Counseling given: Not Answered ?Tobacco comments: stopped in 20's ? ? ?Clinical Intake: ? ?Pre-visit preparation completed: Yes ? ?Pain : No/denies pain ? ?  ? ?BMI - recorded: 28 ?Nutritional Risks: None ?Diabetes: No ? ?How often do you need to have someone help you when you read instructions, pamphlets, or other written materials from your doctor or pharmacy?: 1 - Never ? ?Diabetic?no ? ?  ? ?  ? ? ?Activities of Daily Living ? ?  03/24/2022  ? 10:08 AM   ?In your present state of health, do you have any difficulty performing the following activities:  ?Hearing? 0  ?Vision? 0  ?Walking or climbing stairs? 0  ?Dressing or bathing? 0  ?Doing errands, shoppin

## 2022-04-03 ENCOUNTER — Other Ambulatory Visit: Payer: Self-pay | Admitting: Nurse Practitioner

## 2022-04-03 DIAGNOSIS — E782 Mixed hyperlipidemia: Secondary | ICD-10-CM

## 2022-04-03 DIAGNOSIS — R7989 Other specified abnormal findings of blood chemistry: Secondary | ICD-10-CM

## 2022-05-27 ENCOUNTER — Encounter: Payer: Self-pay | Admitting: Primary Care

## 2022-05-27 ENCOUNTER — Ambulatory Visit: Payer: PPO | Admitting: Primary Care

## 2022-05-27 VITALS — BP 122/74 | HR 69 | Temp 97.7°F | Ht 66.0 in | Wt 178.0 lb

## 2022-05-27 DIAGNOSIS — J42 Unspecified chronic bronchitis: Secondary | ICD-10-CM

## 2022-05-27 DIAGNOSIS — J9611 Chronic respiratory failure with hypoxia: Secondary | ICD-10-CM | POA: Diagnosis not present

## 2022-05-27 DIAGNOSIS — I7121 Aneurysm of the ascending aorta, without rupture: Secondary | ICD-10-CM | POA: Diagnosis not present

## 2022-05-27 DIAGNOSIS — J449 Chronic obstructive pulmonary disease, unspecified: Secondary | ICD-10-CM | POA: Diagnosis not present

## 2022-05-27 MED ORDER — TRELEGY ELLIPTA 200-62.5-25 MCG/ACT IN AEPB: 1.0000 | INHALATION_SPRAY | Freq: Every day | RESPIRATORY_TRACT | 0 refills | Status: DC

## 2022-05-27 NOTE — Progress Notes (Signed)
$'@Patient'd$  ID: Eric [name], male    DOB: 1932-03-29, 88 y.o.   MRN: [id number]  Chief Complaint  Patient presents with   Follow-up    SOB with exertion, dry cough and wheezing.     Referring provider: Lauree Chandler, NP  HPI: 88 year old male, former smoker.  Past medical history significant for CHF, hypertension, p paroxysmal A-fib, chronic asthmatic bronchitis, DVT, PNA 2004, ischemic colitis, covid 19, dysphagia, ataxia, cervical myelopathy, chronic kidney disease stage III, mixed hyperlipidemia, vitamin D deficiency.  Patient of Dr. Halford Chessman, last seen in office on 09/23/2021 for chronic respiratory failure.  Previous [facility] pulmonary encounter: 09/23/21- Dr. Halford Chessman Virtual Visit via Telephone Note   I connected with Eric [name] on 09/23/21 at 10:30 AM EDT by telephone and verified that I am speaking with the correct person using two identifiers.   Location: Patient: home Provider: medical office   I discussed the limitations, risks, security and privacy concerns of performing an evaluation and management service by telephone and the availability of in person appointments. I also discussed with the patient that there Malachi be a patient responsible charge related to this service. The patient expressed understanding and agreed to proceed.     History of Present Illness:   He had CT chest in September.  Showed atherosclerosis, enlarged ascending aorta, changes of chronic aspiration and chronic bronchitis.   He has dry cough.  Hasn't been using mucinex or albuterol much.  Trelegy helps.  No chest pain, fever, hemoptysis, or wheezing.  Using 4 liters oxygen at night.  Opted against doing sleep study.    05/27/2022-interim history Patient presents today for overdue 60-monthfollow-up. Followed by Dr. SHalford Chessmanfor COPD with chronic bronchitis, chronic respiratory failure with hypoxia, dysphagia with chronic aspiration.  He is maintained on Trelegy 100 mcg 1 puff daily.  He is on 4 L of oxygen at  night.  He has a chronic dry cough with no change. Feels Trelegy helps. Uses albuterol nebulizer 3-4 times week  He has been doing some yard work, he will sits in a chair and pull weeds/clean fire pit. He is not able to stand for long periods d.t leg weakness. Ambulates with walker. He is wearing 4L oxygen at night. Concentrator makes his room very hot. Patient opted against doing sleep study.   Imaging: CT angio chest 11/02/20 >> b/l GGO in posterior segments of upper lobes and lower lobes HRCT CT chest 08/23/21 >> ascending aorta 4 cm, atherosclerosis, , PA 3.6 cm, calcified Lt hilar LN, diffuse bronchial thickening, scattered plugs, band like scarring and dependent GGO  Pulmonary testing:  PFT 12/25/18 >> FEV1 1.10 (45%), FEV1% 64, TLC 3.81 (60%)  Sleep testing:  ONO with 2 liters 05/22/20 >> test time 8 hrs 32 min.  Baseline SpO2 91%, SpO2 low 64%.  Spent 57 min 8 sec with SpO2 < 88%.  SpO2 graph suggestive of REM related desaturation pattern that could be seen in sleep apnea.    Allergies  Allergen Reactions   Morphine And Related Shortness Of Breath   Percocet [Oxycodone-Acetaminophen] Shortness Of Breath   Valium Shortness Of Breath    Immunization History  Administered Date(s) Administered   Fluad Quad(high Dose 65+) 10/11/2019, 11/02/2020, 08/13/2021   Influenza Whole 09/11/1997, 10/11/2007, 10/06/2010   Influenza, High Dose Seasonal PF 09/01/2017, 08/30/2018   Influenza,inj,Quad PF,6+ Mos 10/03/2013, 09/15/2014, 09/04/2015   PFIZER(Purple Top)SARS-COV-2 Vaccination 01/18/2020, 02/08/2020   Pneumococcal Conjugate-13 01/19/2015   Pneumococcal Polysaccharide-23 03/05/2002   Td  01/19/2003   Tdap 10/14/2013   Zoster, Live 10/14/2013    Past Medical History:  Diagnosis Date   Anxiety    Arthritis    Atypical chest pain    a. 05/2017 MV: no ischemia, EF 79%.   Chest pain 10/20/2017   Chronic diastolic CHF (congestive heart failure) ([place])    a. 01/2011 Echo: EF 50-55%, gr1  DD, mild AI, nl RV fxn, mild TR/PR; b. 10/2017 Echo: EF 60-65%, mild LVH, gr2 DD.   DDD (degenerative disc disease), cervical    Depressive disorder, not elsewhere classified    Difficult intubation    Dysphagia, oral phase    Dyspnea    Edema    Gallstones    a. Symptomatic - s/p lap chole 05/2018.   History of DVT (deep vein thrombosis)    History of kidney stones    Hypertension    Hypoxemia    Impacted cerumen    Ischemic colitis ([place])    a. 02/2018 GIB - colonoscopy w/ isch colitis. Anticoagulation resumed.   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 a. 01/2011 in setting of post-op complications including aspiration pna;  b. CHA2DS2VASc = 4--> Amio/Eliquis.   Pneumonia 03/06/2003   Spinal stenosis, unspecified region other than cervical    Squamous cell carcinoma of skin of trunk, except scrotum    skin cancer of shoulder   Syncope    a. 10/2017-->Event monitor: RSR, rare PACs/PVCs.   Thoracic or lumbosacral neuritis or radiculitis, unspecified     Tobacco History: Social History   Tobacco Use  Smoking Status Former   Years: 1.00   Types: Cigarettes  Smokeless Tobacco Never  Tobacco Comments   stopped in 20's   Counseling given: Not Answered Tobacco comments: stopped in 20's   Outpatient Medications Prior to Visit  Medication Sig Dispense Refill   albuterol (PROVENTIL) (2.5 MG/3ML) 0.083% nebulizer solution Take 2.5 mg by nebulization every 6 (six) hours as needed for wheezing or shortness of breath.     apixaban (ELIQUIS) 5 MG TABS tablet Take one tablet by mouth twice daily 180 tablet 3   cetirizine (ZYRTEC) 10 MG tablet Take 1 tablet (10 mg total) by mouth daily. 90 tablet 3    Cholecalciferol (VITAMIN D) 1000 UNITS capsule Take one tablet once daily 90 capsule 3   fluticasone (FLONASE) 50 MCG/ACT nasal spray SHAKE LIQUID AND USE 2 SPRAYS IN EACH NOSTRIL DAILY 16 g 0   Fluticasone-Umeclidin-Vilant (TRELEGY ELLIPTA) 200-62.5-25 MCG/ACT AEPB Inhale 1 puff into the lungs daily. 60 each 5   furosemide (LASIX) 40 MG tablet Take 1 tablet (40 mg total) by mouth daily. 90 tablet 3   gabapentin (NEURONTIN) 300 MG capsule TAKE 1 CAPSULE(300 MG) BY MOUTH TWICE DAILY 180 capsule 1   guaiFENesin (MUCINEX) 600 MG 12 hr tablet Take 2 tablets (1,200 mg total) by mouth 2 (two) times daily as needed for cough or to loosen phlegm.     levothyroxine (SYNTHROID) 25 MCG tablet TAKE 1 TABLET(25 MCG) BY MOUTH DAILY BEFORE BREAKFAST 90 tablet 1   lidocaine (LIDODERM) 5 % Place 1 patch onto the skin daily. Remove &  Discard patch within 12 hours or as directed by MD 15 patch 0   metoprolol tartrate (LOPRESSOR) 25 MG tablet TAKE 1 TABLET(25 MG) BY MOUTH TWICE DAILY 180 tablet 3   OXYGEN Inhale 4 L into the lungs at bedtime.      pantoprazole (PROTONIX) 40 MG tablet TAKE 1 TABLET(40 MG) BY MOUTH AT BEDTIME 90 tablet 3   potassium chloride (KLOR-CON) 10 MEQ tablet Take 1 tablet (10 mEq total) by mouth daily. 90 tablet 3   pravastatin (PRAVACHOL) 40 MG tablet TAKE 1 TABLET BY MOUTH EVERY DAY 30 tablet 6   No facility-administered medications prior to visit.    Review of Systems  Review of Systems  Constitutional:  Positive for fatigue.  HENT: Negative.    Respiratory:  Positive for cough. Negative for chest tightness, shortness of breath and wheezing.   Cardiovascular: Negative.     Physical Exam  BP 122/74 (BP Location: Left Arm, Cuff Size: Normal)   Pulse 69   Temp 97.7 F (36.5 C) (Temporal)   Ht '5\' 6"'$  (1.676 m)   Wt 178 lb (80.7 kg)   SpO2 96%   BMI 28.73 kg/m  Physical Exam Constitutional:      Appearance: Normal appearance. He is not ill-appearing.  HENT:     Head:  Normocephalic and atraumatic.  Cardiovascular:     Rate and Rhythm: Normal rate. Rhythm irregular.  Pulmonary:     Effort: Pulmonary effort is normal.     Breath sounds: Rales present. No wheezing or rhonchi.  Musculoskeletal:     Cervical back: Normal range of motion and neck supple.     Comments: Amb with walker  Skin:    General: Skin is warm and dry.  Neurological:     General: No focal deficit present.     Mental Status: He is alert and oriented to person, place, and time. Mental status is at baseline.  Psychiatric:        Mood and Affect: Mood normal.        Behavior: Behavior normal.        Thought Content: Thought content normal.        Judgment: Judgment normal.      Lab Results:  CBC    Component Value Date/Time   WBC 6.4 03/11/2022 1051   RBC 5.47 03/11/2022 1051   HGB 17.1 03/11/2022 1051   HGB 14.5 03/29/2017 1503   HCT 52.0 (H) 03/11/2022 1051   HCT 43.4 03/29/2017 1503   PLT 189 03/11/2022 1051   PLT 210 03/29/2017 1503   MCV 95.1 03/11/2022 1051   MCV 97 03/29/2017 1503   MCH 31.3 03/11/2022 1051   MCHC 32.9 03/11/2022 1051   RDW 12.2 03/11/2022 1051   RDW 13.7 03/29/2017 1503   LYMPHSABS 2,029 03/11/2022 1051   LYMPHSABS 2.4 05/16/2016 1035   MONOABS 0.9 02/27/2019 0145   EOSABS 90 03/11/2022 1051   EOSABS 0.1 05/16/2016 1035   BASOSABS 58 03/11/2022 1051   BASOSABS 0.0 05/16/2016 1035    BMET    Component Value Date/Time   NA 142 03/11/2022 1051   NA 142 05/18/2017 1520   K 4.9 03/11/2022 1051   CL 104 03/11/2022 1051   CO2 28 03/11/2022 1051   GLUCOSE 89 03/11/2022 1051   BUN 25 03/11/2022 1051   BUN 23 07/12/2017 1442   CREATININE 1.21 03/11/2022 1051   CALCIUM 9.9 03/11/2022 1051   GFRNONAA 43 (L) 11/02/2020 1337   GFRNONAA 43 (L) 07/10/2020  1112   GFRAA 50 (L) 07/10/2020 1112    BNP    Component Value Date/Time   BNP 53 05/01/2019 1011    ProBNP    Component Value Date/Time   PROBNP 78.0 02/13/2011 1542     Imaging: No results found.   Assessment & Plan:   Chronic bronchitis ([place]) - Stable; Patient has baseline dry cough. No recenet exacerbations. Continue Trelegy 290mg one puff daily and prn albuterol nebulizer q 6 hours for sob/wheezing  Chronic respiratory failure with hypoxia ([facility]) - No daytime requirements - Continue 4L oxygen at bedtime, needs concentrator serviced   Ascending aortic aneurysm (H[facility]) - No further work up needed per Dr. GRockey Situwith cardiology    EMartyn Ehrich NP 05/27/2022

## 2022-05-27 NOTE — Progress Notes (Signed)
Reviewed and agree with assessment/plan.   Chesley Mires, MD [facility] Pulmonary/Critical Care 05/27/2022, 4:21 PM Pager:  [phone]

## 2022-05-27 NOTE — Assessment & Plan Note (Addendum)
-   No daytime requirements - Continue 4L oxygen at bedtime, needs concentrator serviced

## 2022-05-27 NOTE — Assessment & Plan Note (Addendum)
-   Stable; Patient has baseline dry cough. No recenet exacerbations. Continue Trelegy 220mg one puff daily and prn albuterol nebulizer q 6 hours for sob/wheezing

## 2022-05-27 NOTE — Patient Instructions (Addendum)
  Recommendations: Continue Trelegy 227mg one puff daily  Use albuterol nebulizer every 6 hours as needed for shortness of breath/wheezing Continue to wear 4L oxygen at night Continue to get some exercise daily   Orders: Please service home O2 concentrator/ needs nasal cannula tubing (DME is [employer])  Follow-up: 3-4 months with Dr. SHalford Chessmanor sooner if needed

## 2022-05-27 NOTE — Assessment & Plan Note (Addendum)
-   No further work up needed per Dr. Rockey Situ with cardiology

## 2022-06-09 ENCOUNTER — Telehealth: Payer: Self-pay | Admitting: Primary Care

## 2022-06-09 NOTE — Telephone Encounter (Signed)
Eric [name] since patient is stating that he has filled out paperwork three different times for trelegy and been denied all the times do you want to keep him on trelegy or try a different therapy?  Please advise [employer]

## 2022-06-09 NOTE — Telephone Encounter (Signed)
pts daughter states the pt has filled out the necessary pw for trelogy but the pt has been denied 3xs and his insurance cost would be 184.00 for trelogy and is requesting samples from either [place] or [facility] (doesnt matter which location)...also states he is now willing to do in-home therapy due to no-transportation

## 2022-06-10 MED ORDER — TRELEGY ELLIPTA 200-62.5-25 MCG/ACT IN AEPB
1.0000 | INHALATION_SPRAY | Freq: Every day | RESPIRATORY_TRACT | 0 refills | Status: DC
Start: 2022-06-10 — End: 2022-08-04

## 2022-06-10 NOTE — Telephone Encounter (Signed)
Please send in prescription and help with patient assistance for [facility] Aerosphere two puffs twice daily

## 2022-06-10 NOTE — Telephone Encounter (Signed)
Two samples of trelegy 200 have been placed up front for pickup. Patient's daughter, Cindy(DPR) is aware and voiced her understanding.  Nothing further needed.

## 2022-06-10 NOTE — Telephone Encounter (Signed)
Called patient and spoke to daughter and she states that her dad has been denied for assistance due to him making too much. And she is wondering why Eustaquio Maize is wanting to change medications. She is wanting to pick up samples next week.    Patient wants in home PT from [facility] if this is possible.   Beth please advise

## 2022-06-10 NOTE — Telephone Encounter (Signed)
We can keep him on Trelegy if affordable. It is easier to get patient assistance for [facility]. Both are triple therapy inhalers

## 2022-06-18 ENCOUNTER — Other Ambulatory Visit: Payer: Self-pay | Admitting: Nurse Practitioner

## 2022-07-18 ENCOUNTER — Encounter: Payer: Self-pay | Admitting: Adult Health

## 2022-07-19 ENCOUNTER — Ambulatory Visit (INDEPENDENT_AMBULATORY_CARE_PROVIDER_SITE_OTHER): Payer: PPO | Admitting: Adult Health

## 2022-07-19 ENCOUNTER — Encounter: Payer: Self-pay | Admitting: Adult Health

## 2022-07-19 VITALS — BP 122/60 | HR 50 | Temp 97.5°F | Resp 20 | Ht 66.0 in | Wt 183.0 lb

## 2022-07-19 DIAGNOSIS — I480 Paroxysmal atrial fibrillation: Secondary | ICD-10-CM

## 2022-07-19 DIAGNOSIS — J209 Acute bronchitis, unspecified: Secondary | ICD-10-CM

## 2022-07-19 DIAGNOSIS — I5032 Chronic diastolic (congestive) heart failure: Secondary | ICD-10-CM

## 2022-07-19 DIAGNOSIS — J9611 Chronic respiratory failure with hypoxia: Secondary | ICD-10-CM | POA: Diagnosis not present

## 2022-07-19 MED ORDER — GUAIFENESIN ER 600 MG PO TB12: 1200.0000 mg | ORAL_TABLET | Freq: Two times a day (BID) | ORAL | Status: DC | PRN

## 2022-07-19 MED ORDER — DOXYCYCLINE HYCLATE 100 MG PO TABS: 100.0000 mg | ORAL_TABLET | Freq: Two times a day (BID) | ORAL | 0 refills | Status: AC

## 2022-07-19 NOTE — Patient Instructions (Signed)
  Acute Bronchitis, Adult  Acute bronchitis is when air tubes in the lungs (bronchi) suddenly get swollen. The condition can make it hard for you to breathe. In adults, acute bronchitis usually goes away within 2 weeks. A cough caused by bronchitis Ghuman last up to 3 weeks. Smoking, allergies, and asthma can make the condition worse. What are the causes? Germs that cause cold and flu (viruses). The most common cause of this condition is the virus that causes the common cold. Bacteria. Substances that bother (irritate) the lungs, including: Smoke from cigarettes and other types of tobacco. Dust and pollen. Fumes from chemicals, gases, or burned fuel. Indoor or outdoor air pollution. What increases the risk? A weak body's defense system. This is also called the immune system. Any condition that affects your lungs and breathing, such as asthma. What are the signs or symptoms? A cough. Coughing up clear, yellow, or green mucus. Making high-pitched whistling sounds when you breathe, most often when you breathe out (wheezing). Runny or stuffy nose. Having too much mucus in your lungs (chest congestion). Shortness of breath. Body aches. A sore throat. How is this treated? Acute bronchitis [place] go away over time without treatment. Your doctor Hull tell you to: Drink more fluids. This will help thin your mucus so it is easier to cough up. Use a device that gets medicine into your lungs (inhaler). Use a vaporizer or a humidifier. These are machines that add water to the air. This helps with coughing and poor breathing. Take a medicine that thins mucus and helps clear it from your lungs. Take a medicine that prevents or stops coughing. It is not common to take an antibiotic medicine for this condition. Follow these instructions at home:  Take over-the-counter and prescription medicines only as told by your doctor. Use an inhaler, vaporizer, or humidifier as told by your doctor. Take two  teaspoons (10 mL) of honey at bedtime. This helps lessen your coughing at night. Drink enough fluid to keep your pee (urine) pale yellow. Do not smoke or use any products that contain nicotine or tobacco. If you need help quitting, ask your doctor. Get a lot of rest. Return to your normal activities when your doctor says that it is safe. Keep all follow-up visits. How is this prevented?  Wash your hands often with soap and water for at least 20 seconds. If you cannot use soap and water, use hand sanitizer. Avoid contact with people who have cold symptoms. Try not to touch your mouth, nose, or eyes with your hands. Avoid breathing in smoke or chemical fumes. Make sure to get the flu shot every year. Contact a doctor if: Your symptoms do not get better in 2 weeks. You have trouble coughing up the mucus. Your cough keeps you awake at night. You have a fever. Get help right away if: You cough up blood. You have chest pain. You have very bad shortness of breath. You faint or keep feeling like you are going to faint. You have a very bad headache. Your fever or chills get worse. These symptoms [place] be an emergency. Get help right away. Call your local emergency services (911 in the [place] Do not wait to see if the symptoms will go away. Do not drive yourself to the hospital. Summary Acute bronchitis is when air tubes in the lungs (bronchi) suddenly get swollen. In adults, acute bronchitis usually goes away within 2 weeks. Drink more fluids. This will help thin your mucus so it   is easier to cough up. Take over-the-counter and prescription medicines only as told by your doctor. Contact a doctor if your symptoms do not improve after 2 weeks of treatment. This information is not intended to replace advice given to you by your health care provider. Make sure you discuss any questions you have with your health care provider. Document Revised: 03/31/2021 Document Reviewed: 03/31/2021 Elsevier  Patient Education  2023 Elsevier Inc.  

## 2022-07-19 NOTE — Progress Notes (Signed)
[facility] clinic  Provider:  Durenda Age DNP  Code Status:  Full Code  Goals of Care:     03/24/2022    9:55 AM  Advanced Directives  Does Patient Have a Medical Advance Directive? Yes  Type of Paramedic of [place];Living will  Does patient want to make changes to medical advance directive? No - Patient declined  Copy of [place] in Chart? No - copy requested     Chief Complaint  Patient presents with   Acute Visit    Chronic bronchitis (HCC),Chronic respiratory failure with hypoxia ([place]), SOB         HPI: Patient is a 88 y.o. male seen today for an acute visit for dry cough and possible bronchitis. He has a PMH of CHF, hypertension. PAF, chronic asthmatic bronchitis, DVT, CKD stage III and mixed hyperlipidemia. He came to the clinic today complaining of dry cough. He was noted today to have episodes of continues dry cough wherein his face turned red. He stated that he would have episodes of coughing at night and wife would wake up. He uses oxygen at night and removes in the morning. Coughing started a week ago. He denies having fever nor chills.     Past Medical History:  Diagnosis Date   Anxiety    Arthritis    Atypical chest pain    a. 05/2017 MV: no ischemia, EF 79%.   Chest pain 10/20/2017   Chronic diastolic CHF (congestive heart failure) ([place])    a. 01/2011 Echo: EF 50-55%, gr1 DD, mild AI, nl RV fxn, mild TR/PR; b. 10/2017 Echo: EF 60-65%, mild LVH, gr2 DD.   DDD (degenerative disc disease), cervical    Depressive disorder, not elsewhere classified    Difficult intubation    Dysphagia, oral phase    Dyspnea    Edema    Gallstones    a. Symptomatic - s/p lap chole 05/2018.   History of DVT (deep vein thrombosis)    History of kidney stones    Hypertension    Hypoxemia    Impacted cerumen    Ischemic colitis ([place])    a. 02/2018 GIB - colonoscopy w/ isch colitis. Anticoagulation resumed.   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 a. 01/2011 in setting of post-op complications including aspiration pna;  b. CHA2DS2VASc = 4--> Amio/Eliquis.   Pneumonia 03/06/2003   Spinal stenosis, unspecified region other than cervical    Squamous cell carcinoma of skin of trunk, except scrotum    skin cancer of shoulder   Syncope    a. 10/2017-->Event monitor: RSR, rare PACs/PVCs.   Thoracic or lumbosacral neuritis or radiculitis, unspecified     Past Surgical History:  Procedure Laterality Date   CARDIOVERSION N/A 06/20/2017   Procedure: CARDIOVERSION;  Surgeon: [name], MD;  Location: [facility] ORS;  Service: Cardiovascular;  Laterality: N/A;   CATARACT EXTRACTION W/ INTRAOCULAR LENS  IMPLANT, BILATERAL     CERVICAL FUSION  02/10/2011   CHOLECYSTECTOMY  05/24/2018   CHOLECYSTECTOMY N/A 05/24/2018   Procedure: LAPAROSCOPIC CHOLECYSTECTOMY;  Surgeon: Coralie Keens, MD;  Location: [place];  Service: General;  Laterality: N/A;   COLONOSCOPY WITH PROPOFOL N/A 02/20/2018   Procedure: COLONOSCOPY WITH PROPOFOL;  Surgeon: Lucilla Lame, MD;  Location: [facility];  Service: Endoscopy;  Laterality: N/A;   history of abd ultrasound  11/01   fatty liver   MULTIPLE TOOTH EXTRACTIONS     ORIF FIBULA FRACTURE Left 01/06/2017   Procedure: OPEN REDUCTION INTERNAL FIXATION (ORIF) FIBULA FRACTURE DISTAL FIBULA;  Surgeon: Melrose Nakayama, MD;  Location: [place];  Service: Orthopedics;  Laterality: Left;  Patient states has problems if he will have a tube in throat for Genera; Anesthesia    Allergies  Allergen Reactions   Morphine And Related Shortness Of Breath   Percocet [Oxycodone-Acetaminophen] Shortness Of Breath   Valium Shortness Of  Breath    Outpatient Encounter Medications as of 07/19/2022  Medication Sig   albuterol (PROVENTIL) (2.5 MG/3ML) 0.083% nebulizer solution Take 2.5 mg by nebulization every 6 (six) hours as needed for wheezing or shortness of breath.   cetirizine (ZYRTEC) 10 MG tablet Take 1 tablet (10 mg total) by mouth daily.   Cholecalciferol (VITAMIN D) 1000 UNITS capsule Take one tablet once daily   doxycycline (VIBRA-TABS) 100 MG tablet Take 1 tablet (100 mg total) by mouth 2 (two) times daily for 7 days.   ELIQUIS 5 MG TABS tablet TAKE 1 TABLET BY MOUTH TWICE DAILY   fluticasone (FLONASE) 50 MCG/ACT nasal spray SHAKE LIQUID AND USE 2 SPRAYS IN EACH NOSTRIL DAILY   Fluticasone-Umeclidin-Vilant (TRELEGY ELLIPTA) 200-62.5-25 MCG/ACT AEPB Inhale 1 puff into the lungs daily.   Fluticasone-Umeclidin-Vilant (TRELEGY ELLIPTA) 200-62.5-25 MCG/ACT AEPB Inhale 1 puff into the lungs daily.   Fluticasone-Umeclidin-Vilant (TRELEGY ELLIPTA) 200-62.5-25 MCG/ACT AEPB Inhale 1 puff into the lungs daily.   furosemide (LASIX) 40 MG tablet Take 1 tablet (40 mg total) by mouth daily.   gabapentin (NEURONTIN) 300 MG capsule TAKE 1 CAPSULE(300 MG) BY MOUTH TWICE DAILY   guaiFENesin (MUCINEX) 600 MG 12 hr tablet Take 2 tablets (1,200 mg total) by mouth 2 (two) times daily as needed for cough or to loosen phlegm.   levothyroxine (SYNTHROID) 25 MCG tablet TAKE 1 TABLET(25 MCG) BY MOUTH DAILY BEFORE BREAKFAST   lidocaine (LIDODERM) 5 % Place 1 patch onto the skin daily. Remove & Discard patch within 12 hours or as directed by MD   metoprolol tartrate (LOPRESSOR) 25 MG tablet TAKE 1 TABLET(25 MG) BY MOUTH TWICE DAILY   OXYGEN Inhale 4 L into the lungs at bedtime.    pantoprazole (PROTONIX) 40 MG tablet TAKE 1 TABLET(40 MG) BY MOUTH AT BEDTIME   potassium chloride (KLOR-CON) 10 MEQ tablet Take 1 tablet (10 mEq total) by mouth daily.   pravastatin (PRAVACHOL) 40 MG tablet TAKE 1 TABLET BY MOUTH EVERY DAY   No facility-administered  encounter medications on file as of 07/19/2022.    Review of Systems:  Review of Systems  Constitutional:  Negative for activity change, appetite change and fever.  HENT:  Negative for sore throat.   Eyes: Negative.   Respiratory:  Positive for cough.   Cardiovascular:  Negative for chest pain and leg swelling.  Gastrointestinal:  Negative for abdominal distention, diarrhea and vomiting.  Genitourinary:  Negative for dysuria, frequency and urgency.  Skin:  Negative for color change.  Neurological:  Negative for dizziness and headaches.  Psychiatric/Behavioral:  Negative for behavioral problems and sleep disturbance. The patient is not nervous/anxious.     Health Maintenance  Topic Date Due   Zoster Vaccines- Shingrix (1 of  2) Never done   COVID-19 Vaccine (3 - Pfizer series) 04/04/2020   INFLUENZA VACCINE  07/12/2022   TETANUS/TDAP  10/15/2023   Pneumonia Vaccine 40+ Years old  Completed   HPV VACCINES  Aged Out   COLONOSCOPY (Pts 45-28yr Insurance coverage will need to be confirmed)  Discontinued    Physical Exam: Vitals:   07/19/22 1313  BP: 122/60  Pulse: (!) 50  Resp: 20  Temp: (!) 97.5 F (36.4 C)  SpO2: 94%  Weight: 183 lb (83 kg)  Height: '5\' 6"'$  (1.676 m)   Body mass index is 29.54 kg/m. Physical Exam Constitutional:      General: He is not in acute distress. HENT:     Head: Normocephalic and atraumatic.     Mouth/Throat:     Mouth: Mucous membranes are moist.  Eyes:     Conjunctiva/sclera: Conjunctivae normal.  Cardiovascular:     Rate and Rhythm: Normal rate and regular rhythm.     Pulses: Normal pulses.     Heart sounds: Normal heart sounds.  Pulmonary:     Effort: Pulmonary effort is normal.     Breath sounds: Normal breath sounds.  Abdominal:     General: Bowel sounds are normal.     Palpations: Abdomen is soft.  Musculoskeletal:        General: No swelling. Normal range of motion.     Cervical back: Normal range of motion.  Skin:    General:  Skin is warm and dry.  Neurological:     General: No focal deficit present.     Mental Status: He is alert and oriented to person, place, and time.  Psychiatric:        Mood and Affect: Mood normal.        Behavior: Behavior normal.        Thought Content: Thought content normal.        Judgment: Judgment normal.     Labs reviewed: Basic Metabolic Panel: Recent Labs    08/13/21 1045 03/11/22 1051  NA 142 142  K 4.3 4.9  CL 105 104  CO2 29 28  GLUCOSE 90 89  BUN 20 25  CREATININE 1.04 1.21  CALCIUM 9.4 9.9  TSH 1.62 1.66   Liver Function Tests: Recent Labs    08/13/21 1045 03/11/22 1051  AST 35 44*  ALT 33 48*  BILITOT 1.4* 1.4*  PROT 7.2 7.4   No results for input(s): "LIPASE", "AMYLASE" in the last 8760 hours. No results for input(s): "AMMONIA" in the last 8760 hours. CBC: Recent Labs    08/13/21 1045 03/11/22 1051  WBC 5.7 6.4  NEUTROABS 3,443 3,738  HGB 15.4 17.1  HCT 46.6 52.0*  MCV 96.1 95.1  PLT 175 189   Lipid Panel: Recent Labs    08/13/21 1045 03/11/22 1051  CHOL 112 125  HDL 33* 36*  LDLCALC 58 69  TRIG 125 122  CHOLHDL 3.4 3.5   Lab Results  Component Value Date   HGBA1C 5.7 (H) 05/16/2016    Procedures since last visit: No results found.  Assessment/Plan  1. Acute bronchitis, unspecified organism -  instructed to do light bronchial tapping to help mobilize bronchial secretions - doxycycline (VIBRA-TABS) 100 MG tablet; Take 1 tablet (100 mg total) by mouth 2 (two) times daily for 7 days.  Dispense: 14 tablet; Refill: 0 - guaiFENesin (MUCINEX) 600 MG 12 hr tablet; Take 2 tablets (1,200 mg total) by mouth 2 (two) times daily as needed for cough  or to loosen phlegm.  2. Chronic respiratory failure with hypoxia (HCC) -  continue O2 @ bedtime  3. Paroxysmal atrial fibrillation (HCC) -  rate-controlled, continue Eliquis for anticoagulation and Metoprolol tartrate for rate control  4. Chronic diastolic CHF (congestive heart  failure) (HCC) -  stable, continue Lasix    Labs/tests ordered:  None  Next appt:  09/05/2022

## 2022-08-04 ENCOUNTER — Telehealth: Payer: Self-pay | Admitting: Primary Care

## 2022-08-04 MED ORDER — TRELEGY ELLIPTA 200-62.5-25 MCG/ACT IN AEPB: 1.0000 | INHALATION_SPRAY | Freq: Every day | RESPIRATORY_TRACT | 0 refills | Status: DC

## 2022-08-04 NOTE — Telephone Encounter (Signed)
Called and spoke with pt's daughter Jenny Reichmann letting her know that we did have a sample of Trelegy that I was placing up front for pt and she verbalized understanding. Also have placed pt assistance paperwork in with the sample for them to fill out to see if pt might be able to be approved help with meds. Nothing further needed.

## 2022-08-29 ENCOUNTER — Other Ambulatory Visit: Payer: Self-pay | Admitting: Nurse Practitioner

## 2022-08-29 ENCOUNTER — Telehealth: Payer: Self-pay | Admitting: Pulmonary Disease

## 2022-08-29 DIAGNOSIS — G8929 Other chronic pain: Secondary | ICD-10-CM

## 2022-08-29 MED ORDER — TRELEGY ELLIPTA 200-62.5-25 MCG/ACT IN AEPB: 1.0000 | INHALATION_SPRAY | Freq: Every day | RESPIRATORY_TRACT | 0 refills | Status: DC

## 2022-08-29 NOTE — Telephone Encounter (Signed)
Spoke to patient's daughter, Cindy(DPR). She is aware that one sample of sample has been placed up front for pick up. Nothing further needed.

## 2022-08-29 NOTE — Telephone Encounter (Signed)
Patient is needing more samples of Trelegy- patient is in the doughnut hole and cannot get patient assistance. Patient can pick up samples in [place] or [place] office- please advise and call daughter at [phone].  Denied appointment until they make cardiology appointment.

## 2022-09-05 ENCOUNTER — Ambulatory Visit: Payer: PPO | Admitting: Nurse Practitioner

## 2022-09-12 ENCOUNTER — Telehealth: Payer: Self-pay | Admitting: Pulmonary Disease

## 2022-09-12 MED ORDER — TRELEGY ELLIPTA 200-62.5-25 MCG/ACT IN AEPB: 1.0000 | INHALATION_SPRAY | Freq: Every day | RESPIRATORY_TRACT | 0 refills | Status: DC

## 2022-09-12 NOTE — Telephone Encounter (Signed)
Jenny Reichmann daughter states patient is in the donut hole. Would like samples of Trelegy, can be picked up in [place] or [place]. Cindy phone number is [phone].

## 2022-09-12 NOTE — Telephone Encounter (Signed)
Called and spoke to patient's daughter, Cindy(DPR). Jenny Reichmann stated that patient did not qualify for patient assistance.  One sample has been placed up front for pickup. Jenny Reichmann is aware that we have been providing patient with samples since June. I have advised to contact insurance  and request a cheaper alternative, as we can not ensure that we will always have samples to provide. She became upset and stated that she would make Dr. Halford Chessman aware of this. Routing to Dr. Halford Chessman as an Juluis Rainier.  Nothing further needed.

## 2022-09-12 NOTE — Telephone Encounter (Signed)
Please see 08/29/2022 phone note.

## 2022-09-12 NOTE — Telephone Encounter (Signed)
Lm for patient's daughter, Cindy(DPR)

## 2022-09-12 NOTE — Addendum Note (Signed)
Addended by: Claudette Head A on: 09/12/2022 04:38 PM   Modules accepted: Orders

## 2022-09-14 ENCOUNTER — Encounter: Payer: Self-pay | Admitting: Nurse Practitioner

## 2022-09-16 ENCOUNTER — Encounter: Payer: PPO | Admitting: Nurse Practitioner

## 2022-09-16 DIAGNOSIS — Z23 Encounter for immunization: Secondary | ICD-10-CM

## 2022-09-19 NOTE — Progress Notes (Signed)
This encounter was created in error - please disregard.

## 2022-09-23 ENCOUNTER — Encounter: Payer: Self-pay | Admitting: Adult Health

## 2022-09-23 ENCOUNTER — Ambulatory Visit (INDEPENDENT_AMBULATORY_CARE_PROVIDER_SITE_OTHER): Payer: PPO | Admitting: Adult Health

## 2022-09-23 VITALS — BP 140/78 | HR 55 | Temp 97.5°F | Resp 20 | Ht 66.0 in | Wt 187.0 lb

## 2022-09-23 DIAGNOSIS — I5032 Chronic diastolic (congestive) heart failure: Secondary | ICD-10-CM

## 2022-09-23 DIAGNOSIS — E039 Hypothyroidism, unspecified: Secondary | ICD-10-CM | POA: Diagnosis not present

## 2022-09-23 DIAGNOSIS — I480 Paroxysmal atrial fibrillation: Secondary | ICD-10-CM

## 2022-09-23 DIAGNOSIS — R1314 Dysphagia, pharyngoesophageal phase: Secondary | ICD-10-CM | POA: Diagnosis not present

## 2022-09-23 DIAGNOSIS — E782 Mixed hyperlipidemia: Secondary | ICD-10-CM

## 2022-09-23 DIAGNOSIS — Z23 Encounter for immunization: Secondary | ICD-10-CM | POA: Diagnosis not present

## 2022-09-23 DIAGNOSIS — I10 Essential (primary) hypertension: Secondary | ICD-10-CM | POA: Diagnosis not present

## 2022-09-23 DIAGNOSIS — J9611 Chronic respiratory failure with hypoxia: Secondary | ICD-10-CM

## 2022-09-23 NOTE — Progress Notes (Unsigned)
[facility] clinic  Provider:  Durenda Age DNP  Code Status:  Full Code  Goals of Care:     09/23/2022    8:09 AM  Advanced Directives  Does Patient Have a Medical Advance Directive? No  Would patient like information on creating a medical advance directive? No - Patient declined     Chief Complaint  Patient presents with   Medical Management of Chronic Issues     Patient is here for a follow up for chronic conditions 79M    Quality Metric Gaps    Discuss need for covid, shingrix, and flu vaccine    HPI: Patient is a 88 y.o. male seen today for medical management of chronic disease. He mentioned that he sometimes would cough when he is eating. Discussed about barium swallow but he declined. He said that he has done the barium swallow study 3-4X already and did not find anything.     Past Medical History:  Diagnosis Date   Anxiety    Arthritis    Atypical chest pain    a. 05/2017 MV: no ischemia, EF 79%.   Chest pain 10/20/2017   Chronic diastolic CHF (congestive heart failure) ([place])    a. 01/2011 Echo: EF 50-55%, gr1 DD, mild AI, nl RV fxn, mild TR/PR; b. 10/2017 Echo: EF 60-65%, mild LVH, gr2 DD.   DDD (degenerative disc disease), cervical    Depressive disorder, not elsewhere classified    Difficult intubation    Dysphagia, oral phase    Dyspnea    Edema    Gallstones    a. Symptomatic - s/p lap chole 05/2018.   History of DVT (deep vein thrombosis)    History of kidney stones    Hypertension    Hypoxemia    Impacted cerumen    Ischemic colitis ([place])    a. 02/2018 GIB - colonoscopy w/ isch colitis. Anticoagulation resumed.   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 a. 01/2011 in  setting of post-op complications including aspiration pna;  b. CHA2DS2VASc = 4--> Amio/Eliquis.   Pneumonia 03/06/2003   Spinal stenosis, unspecified region other than cervical    Squamous cell carcinoma of skin of trunk, except scrotum    skin cancer of shoulder   Syncope    a. 10/2017-->Event monitor: RSR, rare PACs/PVCs.   Thoracic or lumbosacral neuritis or radiculitis, unspecified     Past Surgical History:  Procedure Laterality Date   CARDIOVERSION N/A 06/20/2017   Procedure: CARDIOVERSION;  Surgeon: [name], MD;  Location: [facility] OR[facility];  Service: Cardiovascular;  Laterality: N/A;   CATARACT EXTRACTION W/ INTRAOCULAR LENS  IMPLANT, BILATERAL     CERVICAL FUSION  02/10/2011   CHOLECYSTECTOMY  05/24/2018   CHOLECYSTECTOMY N/A 05/24/2018   Procedure: LAPAROSCOPIC CHOLECYSTECTOMY;  Surgeon: Coralie Keens, MD;  Location: [place];  Service: General;  Laterality: N/A;   COLONOSCOPY WITH PROPOFOL N/A 02/20/2018   Procedure: COLONOSCOPY WITH PROPOFOL;  Surgeon: Lucilla Lame, MD;  Location: [facility];  Service: Endoscopy;  Laterality: N/A;   history of abd ultrasound  11/01   fatty liver   MULTIPLE TOOTH EXTRACTIONS  ORIF FIBULA FRACTURE Left 01/06/2017   Procedure: OPEN REDUCTION INTERNAL FIXATION (ORIF) FIBULA FRACTURE DISTAL FIBULA;  Surgeon: Melrose Nakayama, MD;  Location: [place];  Service: Orthopedics;  Laterality: Left;  Patient states has problems if he will have a tube in throat for Genera; Anesthesia    Allergies  Allergen Reactions   Morphine And Related Shortness Of Breath   Percocet [Oxycodone-Acetaminophen] Shortness Of Breath   Valium Shortness Of Breath    Outpatient Encounter Medications as of 09/23/2022  Medication Sig   albuterol (PROVENTIL) (2.5 MG/3ML) 0.083% nebulizer solution Take 2.5 mg by nebulization every 6 (six) hours as needed for wheezing or shortness of breath.   cetirizine (ZYRTEC) 10 MG tablet Take 1 tablet (10 mg total) by mouth daily.    Cholecalciferol (VITAMIN D) 1000 UNITS capsule Take one tablet once daily   ELIQUIS 5 MG TABS tablet TAKE 1 TABLET BY MOUTH TWICE DAILY   fluticasone (FLONASE) 50 MCG/ACT nasal spray SHAKE LIQUID AND USE 2 SPRAYS IN EACH NOSTRIL DAILY   Fluticasone-Umeclidin-Vilant (TRELEGY ELLIPTA) 200-62.5-25 MCG/ACT AEPB Inhale 1 puff into the lungs daily.   Fluticasone-Umeclidin-Vilant (TRELEGY ELLIPTA) 200-62.5-25 MCG/ACT AEPB Inhale 1 puff into the lungs daily.   Fluticasone-Umeclidin-Vilant (TRELEGY ELLIPTA) 200-62.5-25 MCG/ACT AEPB Inhale 1 puff into the lungs daily.   Fluticasone-Umeclidin-Vilant (TRELEGY ELLIPTA) 200-62.5-25 MCG/ACT AEPB Inhale 1 puff into the lungs daily.   furosemide (LASIX) 40 MG tablet Take 1 tablet (40 mg total) by mouth daily.   gabapentin (NEURONTIN) 300 MG capsule TAKE 1 CAPSULE(300 MG) BY MOUTH TWICE DAILY   guaiFENesin (MUCINEX) 600 MG 12 hr tablet Take 2 tablets (1,200 mg total) by mouth 2 (two) times daily as needed for cough or to loosen phlegm.   levothyroxine (SYNTHROID) 25 MCG tablet TAKE 1 TABLET(25 MCG) BY MOUTH DAILY BEFORE BREAKFAST   lidocaine (LIDODERM) 5 % Place 1 patch onto the skin daily. Remove & Discard patch within 12 hours or as directed by MD   metoprolol tartrate (LOPRESSOR) 25 MG tablet TAKE 1 TABLET(25 MG) BY MOUTH TWICE DAILY   OXYGEN Inhale 4 L into the lungs at bedtime.    pantoprazole (PROTONIX) 40 MG tablet TAKE 1 TABLET(40 MG) BY MOUTH AT BEDTIME   potassium chloride (KLOR-CON) 10 MEQ tablet Take 1 tablet (10 mEq total) by mouth daily.   pravastatin (PRAVACHOL) 40 MG tablet TAKE 1 TABLET BY MOUTH EVERY DAY   No facility-administered encounter medications on file as of 09/23/2022.    Review of Systems:  Review of Systems  Constitutional:  Negative for activity change, appetite change and fever.  HENT:  Negative for sore throat.   Eyes: Negative.   Cardiovascular:  Negative for chest pain and leg swelling.  Gastrointestinal:  Negative for  abdominal distention, diarrhea and vomiting.  Genitourinary:  Negative for dysuria, frequency and urgency.  Skin:  Negative for color change.  Allergic/Immunologic: Negative for environmental allergies.  Neurological:  Positive for numbness. Negative for dizziness and headaches.       Right leg  numbness 2-3X/week  Psychiatric/Behavioral:  Negative for behavioral problems and sleep disturbance. The patient is not nervous/anxious.     Health Maintenance  Topic Date Due   Zoster Vaccines- Shingrix (1 of 2) Never done   COVID-19 Vaccine (3 - Pfizer series) 04/04/2020   INFLUENZA VACCINE  07/12/2022   TETANUS/TDAP  10/15/2023   Pneumonia Vaccine 28+ Years old  Completed   HPV VACCINES  Aged Out   COLONOSCOPY (Pts 45-2yr Insurance coverage will need  to be confirmed)  Discontinued    Physical Exam: There were no vitals filed for this visit. There is no height or weight on file to calculate BMI. Physical Exam Constitutional:      Appearance: Normal appearance.  HENT:     Head: Normocephalic and atraumatic.     Mouth/Throat:     Mouth: Mucous membranes are moist.  Eyes:     Conjunctiva/sclera: Conjunctivae normal.  Cardiovascular:     Rate and Rhythm: Normal rate and regular rhythm.     Pulses: Normal pulses.     Heart sounds: Normal heart sounds.  Pulmonary:     Effort: Pulmonary effort is normal.     Breath sounds: Normal breath sounds.  Abdominal:     General: Bowel sounds are normal.     Palpations: Abdomen is soft.  Musculoskeletal:        General: No swelling. Normal range of motion.     Cervical back: Normal range of motion.  Skin:    General: Skin is warm and dry.  Neurological:     General: No focal deficit present.     Mental Status: He is alert and oriented to person, place, and time.  Psychiatric:        Mood and Affect: Mood normal.        Behavior: Behavior normal.        Thought Content: Thought content normal.        Judgment: Judgment normal.      Labs reviewed: Basic Metabolic Panel: Recent Labs    03/11/22 1051  NA 142  K 4.9  CL 104  CO2 28  GLUCOSE 89  BUN 25  CREATININE 1.21  CALCIUM 9.9  TSH 1.66   Liver Function Tests: Recent Labs    03/11/22 1051  AST 44*  ALT 48*  BILITOT 1.4*  PROT 7.4   No results for input(s): "LIPASE", "AMYLASE" in the last 8760 hours. No results for input(s): "AMMONIA" in the last 8760 hours. CBC: Recent Labs    03/11/22 1051  WBC 6.4  NEUTROABS 3,738  HGB 17.1  HCT 52.0*  MCV 95.1  PLT 189   Lipid Panel: Recent Labs    03/11/22 1051  CHOL 125  HDL 36*  LDLCALC 69  TRIG 122  CHOLHDL 3.5   Lab Results  Component Value Date   HGBA1C 5.7 (H) 05/16/2016    Procedures since last visit: No results found.  Assessment/Plan 1. Need for vaccination ***    Labs/tests ordered:  * No order type specified * Next appt:  Visit date not found

## 2022-09-23 NOTE — Patient Instructions (Signed)
Neuropathic Pain Neuropathic pain is pain caused by damage to the nerves that are responsible for certain sensations in your body (sensory nerves). Neuropathic pain can make you more sensitive to pain. Even a minor sensation can feel very painful. This is usually a long-term (chronic) condition that can be difficult to treat. The type of pain differs from person to person. It [place]: Start suddenly (acute), or it [place] develop slowly and become chronic. Come and go as damaged nerves heal, or it [place] stay at the same level for years. Cause emotional distress, loss of sleep, and a lower quality of life. What are the causes? The most common cause of this condition is diabetes. Many other diseases and conditions can also cause neuropathic pain. Causes of neuropathic pain can be classified as: Toxic. This is caused by medicines and chemicals. The most common causes of toxic neuropathic pain is damage from medicines that kill cancer cells (chemotherapy) or alcohol abuse. Metabolic. This can be caused by: Diabetes. Lack of vitamins like B12. Traumatic. Any injury that cuts, crushes, or stretches a nerve can cause damage and pain. Compression-related. If a sensory nerve gets trapped or compressed for a long period of time, the blood supply to the nerve can be cut off. Vascular. Many blood vessel diseases can cause neuropathic pain by decreasing blood supply and oxygen to nerves. Autoimmune. This type of pain results from diseases in which the body's defense system (immune system) mistakenly attacks sensory nerves. Examples of autoimmune diseases that can cause neuropathic pain include lupus and multiple sclerosis. Infectious. Many types of viral infections can damage sensory nerves and cause pain. Shingles infection is a common cause of this type of pain. Inherited. Neuropathic pain can be a symptom of many diseases that are passed down through families (genetic). What increases the risk? You are more likely to  develop this condition if: You have diabetes. You smoke. You drink too much alcohol. You are taking certain medicines, including chemotherapy or medicines that treat immune system disorders. What are the signs or symptoms? The main symptom is pain. Neuropathic pain is often described as: Burning. Shock-like. Stinging. Hot or cold. Itching. How is this diagnosed? No single test can diagnose neuropathic pain. It is diagnosed based on: A physical exam and your symptoms. Your health care provider will ask you about your pain. You Foot be asked to use a pain scale to describe how bad your pain is. Tests. These [place] be done to see if you have a cause and location of any nerve damage. They include: Nerve conduction studies and electromyography to test how well nerve signals travel through your nerves and muscles (electrodiagnostic testing). Skin biopsy to evaluate for small fiber neuropathy. Imaging studies, such as: X-rays. CT scan. MRI. How is this treated? Treatment for neuropathic pain [place] change over time. You [place] need to try different treatment options or a combination of treatments. Some options include: Treating the underlying cause of the neuropathy, such as diabetes, kidney disease, or vitamin deficiencies. Stopping medicines that can cause neuropathy, such as chemotherapy. Medicine to relieve pain. Medicines [place] include: Prescription or over-the-counter pain medicine. Anti-seizure medicine. Antidepressant medicines. Pain-relieving patches or creams that are applied to painful areas of skin. A medicine to numb the area (local anesthetic), which can be injected as a nerve block. Transcutaneous nerve stimulation. This uses electrical currents to block painful nerve signals. The treatment is painless. Alternative treatments, such as: Acupuncture. Meditation. Massage. Occupational or physical therapy. Pain management programs. Counseling. Follow   these instructions at  home: Medicines  Take over-the-counter and prescription medicines only as told by your health care provider. Ask your health care provider if the medicine prescribed to you: Requires you to avoid driving or using machinery. Can cause constipation. You [place] need to take these actions to prevent or treat constipation: Drink enough fluid to keep your urine pale yellow. Take over-the-counter or prescription medicines. Eat foods that are high in fiber, such as beans, whole grains, and fresh fruits and vegetables. Limit foods that are high in fat and processed sugars, such as fried or sweet foods. Lifestyle  Have a good support system at home. Consider joining a chronic pain support group. Do not use any products that contain nicotine or tobacco. These products include cigarettes, chewing tobacco, and vaping devices, such as e-cigarettes. If you need help quitting, ask your health care provider. Do not drink alcohol. General instructions Learn as much as you can about your condition. Work closely with all your health care providers to find the treatment plan that works best for you. Ask your health care provider what activities are safe for you. Keep all follow-up visits. This is important. Contact a health care provider if: Your pain treatments are not working. You are having side effects from your medicines. You are struggling with tiredness (fatigue), mood changes, depression, or anxiety. Get help right away if: You have thoughts of hurting yourself. Get help right away if you feel like you [place] hurt yourself or others, or have thoughts about taking your own life. Go to your nearest emergency room or: Call 911. Call the National Suicide Prevention Lifeline at 1-800-273-8255 or 988. This is open 24 hours a day. Text the Crisis Text Line at 741741. Summary Neuropathic pain is pain caused by damage to the nerves that are responsible for certain sensations in your body (sensory  nerves). Neuropathic pain [place] come and go as damaged nerves heal, or it [place] stay at the same level for years. Neuropathic pain is usually a long-term condition that can be difficult to treat. Consider joining a chronic pain support group. This information is not intended to replace advice given to you by your health care provider. Make sure you discuss any questions you have with your health care provider. Document Revised: 07/26/2021 Document Reviewed: 07/26/2021 Elsevier Patient Education  2023 Elsevier Inc.  

## 2022-09-24 LAB — BASIC METABOLIC PANEL WITH GFR
BUN: 19 mg/dL (ref 7–25)
CO2: 30 mmol/L (ref 20–32)
Calcium: 9.7 mg/dL (ref 8.6–10.3)
Chloride: 104 mmol/L (ref 98–110)
Creat: 1.07 mg/dL (ref 0.70–1.22)
Glucose, Bld: 89 mg/dL (ref 65–99)
Potassium: 4.9 mmol/L (ref 3.5–5.3)
Sodium: 143 mmol/L (ref 135–146)
eGFR: 67 mL/min/{1.73_m2} (ref >=60)

## 2022-09-27 ENCOUNTER — Telehealth: Payer: Self-pay | Admitting: Pulmonary Disease

## 2022-09-27 NOTE — Telephone Encounter (Signed)
Called patient and daughter and let her know that she can pick up samples at front office when she is ready. She states she will be by today to pick up samples. Nothing further needed

## 2022-09-27 NOTE — Telephone Encounter (Signed)
Patient's daughter called to request a sample of Trelegy for her father because he is in the donut whole.  Please call daughter and let her know when she can pick up the samples at [phone]

## 2022-09-30 NOTE — Progress Notes (Signed)
BMP normal. Great! No new order.

## 2022-10-17 ENCOUNTER — Ambulatory Visit: Payer: PPO | Admitting: Pulmonary Disease

## 2022-10-17 ENCOUNTER — Telehealth: Payer: Self-pay

## 2022-10-17 ENCOUNTER — Encounter: Payer: Self-pay | Admitting: Pulmonary Disease

## 2022-10-17 ENCOUNTER — Other Ambulatory Visit (HOSPITAL_COMMUNITY): Payer: Self-pay

## 2022-10-17 VITALS — BP 126/74 | HR 64 | Temp 98.1°F | Ht 66.0 in | Wt 184.8 lb

## 2022-10-17 DIAGNOSIS — J449 Chronic obstructive pulmonary disease, unspecified: Secondary | ICD-10-CM | POA: Diagnosis not present

## 2022-10-17 DIAGNOSIS — J9611 Chronic respiratory failure with hypoxia: Secondary | ICD-10-CM

## 2022-10-17 MED ORDER — TRELEGY ELLIPTA 200-62.5-25 MCG/ACT IN AEPB: 1.0000 | INHALATION_SPRAY | Freq: Every day | RESPIRATORY_TRACT | 0 refills | Status: DC

## 2022-10-17 MED ORDER — FLUTICASONE-SALMETEROL 250-50 MCG/ACT IN AEPB: 1.0000 | INHALATION_SPRAY | Freq: Two times a day (BID) | RESPIRATORY_TRACT | 2 refills | Status: DC

## 2022-10-17 MED ORDER — FLUTICASONE PROPIONATE 50 MCG/ACT NA SUSP: 1.0000 | Freq: Every day | NASAL | 3 refills | Status: DC | PRN

## 2022-10-17 NOTE — Patient Instructions (Signed)
Will ask our pharmacy team check on whether there is a more affordable inhaler option compared to trelegy.  Follow up in 6 months.

## 2022-10-17 NOTE — Progress Notes (Signed)
[facility]  Chief Complaint  Patient presents with   Follow-up    SOB with exertion, dry cough and occ wheezing    Constitutional:  BP 126/74 (BP Location: Left Arm, Cuff Size: Normal)   Pulse 64   Temp 98.1 F (36.7 C) (Temporal)   Ht '5\' 6"'$  (1.676 m)   Wt 184 lb 12.8 oz (83.8 kg)   SpO2 94%   BMI 29.83 kg/m   Past Medical History:  Anxiety, OA, Chronic diastolic CHF, Depression, Dysphagia, DVT, Nephrolithiasis, HTN, Ischemic colitis, Back pain, HLD, PAF, PNA 2004, Spinal stenosis, COVID 30 December 2020  Past Surgical History:  He  has a past surgical history that includes history of abd ultrasound (11/01); Cervical fusion (02/10/2011); Cataract extraction w/ intraocular lens  implant, bilateral; Multiple tooth extractions; ORIF fibula fracture (Left, 01/06/2017); CARDIOVERSION (N/A, 06/20/2017); Colonoscopy with propofol (N/A, 02/20/2018); Cholecystectomy (05/24/2018); and Cholecystectomy (N/A, 05/24/2018).  Brief Summary:  Eric [name] is a 88 y.o. 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      Subjective:   He gets intermittent episodes of cough and wheeze.  These occur with different exposure, especially cold weather.  He uses his albuterol and then feels better.  He is sleeping okay.  He is not very active and doesn't go out except to doctor appointments.  He gets winded when walking to his mailbox, but feels better after resting a few minutes.  Not having sinus congestion, sputum, chest pain, or leg swelling.  He uses his oxygen at night.  He is concerned how much trelegy costs when he hits the donut hole.    Physical Exam:   Appearance - well kempt, uses a walker  ENMT - no sinus tenderness, no oral exudate, no LAN, Mallampati 3 airway, no stridor  Respiratory - decreased breath sounds bilaterally, no wheezing or rales  CV - irregular  Ext - no clubbing, no edema  Skin - no  rashes  Psych - normal mood and affect   Pulmonary testing:  PFT 12/25/18 >> FEV1 1.10 (45%), FEV1% 64, TLC 3.81 (60%)  Chest Imaging:  CT angio chest 11/02/20 >> b/l GGO in posterior segments of upper lobes and lower lobes HRCT CT chest 08/23/21 >> ascending aorta 4 cm, atherosclerosis, , PA 3.6 cm, calcified Lt hilar LN, diffuse bronchial thickening, scattered plugs, band like scarring and dependent GGO  Sleep Tests:  ONO with 2 liters 05/22/20 >> test time 8 hrs 32 min.  Baseline SpO2 91%, SpO2 low 64%.  Spent 57 min 8 sec with SpO2 < 88%.  SpO2 graph suggestive of REM related desaturation pattern that could be seen in sleep apnea.  Cardiac Tests:  Echo 10/20/17 >> EF 60 to 65%, mild LVH, grade 2 DD  Social History:  He  reports that he has quit smoking. His smoking use included cigarettes. He has never used smokeless tobacco. He reports that he does not drink alcohol and does not use drugs.  Family History:  His family history includes Atrial fibrillation in his brother; Cancer in an other family member; Heart disease in his brother; Other in his mother; Stroke in his father and paternal grandfather.     Assessment/Plan:   COPD with chronic bronchitis. - continue trelegy 100 one puff daily - will have pharmacy team check if there is a more affordable option than trelegy; if cost of medication remains an issue then he might need to change to  pulmicort and duoneb by nebulizer  - prn albuterol - prn mucinex  Allergic rhinitis. - prn flonase  Dysphagia with chronic aspiration. - bronchial hygiene - discussed symptoms to monitor for that would indicate he has a respiratory infection and would need antibiotics   Chronic respiratory failure with hypoxia. - he uses 4 liters oxygen at night  Ascending aortic aneurysm. - he is followed by Dr. Rockey Situ with cardiology for atrial fibrillation - advised him to discuss with Dr. Rockey Situ at his next follow up, but suspect at best he would  need clinical monitoring because I don't think he would be able to tolerate any interventions   Time Spent Involved in Patient Care on Day of Examination:  28 minutes  Follow up:   Patient Instructions  Will ask our pharmacy team check on whether there is a more affordable inhaler option compared to trelegy.  Follow up in 6 months.  Medication List:   Allergies as of 10/17/2022       Reactions   Morphine And Related Shortness Of Breath   Percocet [oxycodone-acetaminophen] Shortness Of Breath   Valium Shortness Of Breath        Medication List        Accurate as of October 17, 2022 10:07 AM. If you have any questions, ask your nurse or doctor.          albuterol (2.5 MG/3ML) 0.083% nebulizer solution Commonly known as: PROVENTIL Take 2.5 mg by nebulization every 6 (six) hours as needed for wheezing or shortness of breath.   cetirizine 10 MG tablet Commonly known as: ZYRTEC Take 1 tablet (10 mg total) by mouth daily.   Eliquis 5 MG Tabs tablet Generic drug: apixaban TAKE 1 TABLET BY MOUTH TWICE DAILY   fluticasone 50 MCG/ACT nasal spray Commonly known as: FLONASE Place 1 spray into both nostrils daily as needed for allergies or rhinitis. What changed: See the new instructions. Changed by: Chesley Mires, MD   furosemide 40 MG tablet Commonly known as: LASIX Take 1 tablet (40 mg total) by mouth daily.   gabapentin 300 MG capsule Commonly known as: NEURONTIN TAKE 1 CAPSULE(300 MG) BY MOUTH TWICE DAILY   guaiFENesin 600 MG 12 hr tablet Commonly known as: Mucinex Take 2 tablets (1,200 mg total) by mouth 2 (two) times daily as needed for cough or to loosen phlegm.   levothyroxine 25 MCG tablet Commonly known as: SYNTHROID TAKE 1 TABLET(25 MCG) BY MOUTH DAILY BEFORE BREAKFAST   lidocaine 5 % Commonly known as: Lidoderm Place 1 patch onto the skin daily. Remove & Discard patch within 12 hours or as directed by MD   metoprolol tartrate 25 MG tablet Commonly  known as: LOPRESSOR TAKE 1 TABLET(25 MG) BY MOUTH TWICE DAILY   OXYGEN Inhale 4 L into the lungs at bedtime.   pantoprazole 40 MG tablet Commonly known as: PROTONIX TAKE 1 TABLET(40 MG) BY MOUTH AT BEDTIME   potassium chloride 10 MEQ tablet Commonly known as: KLOR-CON M Take 1 tablet (10 mEq total) by mouth daily.   pravastatin 40 MG tablet Commonly known as: PRAVACHOL TAKE 1 TABLET BY MOUTH EVERY DAY   Trelegy Ellipta 200-62.5-25 MCG/ACT Aepb Generic drug: Fluticasone-Umeclidin-Vilant Inhale 1 puff into the lungs daily.   Trelegy Ellipta 200-62.5-25 MCG/ACT Aepb Generic drug: Fluticasone-Umeclidin-Vilant Inhale 1 puff into the lungs daily.   Vitamin D 1000 units capsule Take one tablet once daily        Signature:  Chesley Mires, MD [place] Pager - [phone] 10/17/2022, 10:07 AM

## 2022-10-17 NOTE — Telephone Encounter (Signed)
Per benefits investigation the triple therapy (ICS+LABA+LAMA) co-pays are: Trelegy- $146.06 Breztri- $143.28  ICS+LABA: Advair Diskus- $34.81 Breo- $90.44  LABA+LAMA: Anoro- $105.46  LABA: Serevent- $96.87  LAMA:  Incruse- $81.00 Spiriva Handihaler- $96.77 Spiriva Respimat- $117.13  ICS: Arnuity- $47.69 Flovent Diskus- $46.79 Brand Flovent HFA- $62.64

## 2022-10-17 NOTE — Telephone Encounter (Signed)
We could try changing him to advair 250 one puff bid and see if this maintains his symptoms.

## 2022-10-17 NOTE — Telephone Encounter (Signed)
Patient's daughter, Cindy(DPR) is aware of below message/recommendations. Advair sent to preferred pharmacy. Nothing further needed.

## 2022-10-17 NOTE — Telephone Encounter (Signed)
Trelegy is not affordable. Patient is requesting a cheaper alternatives.  Pharmacy team, can you guys assist with this? Thanks

## 2022-10-17 NOTE — Telephone Encounter (Signed)
Dr. Halford Chessman, please advise. thanks

## 2022-11-20 NOTE — Progress Notes (Unsigned)
Date:  11/21/2022   ID:  Montey Hora Bergthold, DOB [birth year], MRN [id number]  Patient Location:  [address] [place] [address]   Provider location:   [facility], [place] office  PCP:  Lauree Chandler, NP  Cardiologist:  Arvid Right [facility]  Chief Complaint  Patient presents with   12 month follow up     Patient c/o shortness of breath on exertion. Medications reviewed by the patient verbally.     History of Present Illness:    Dasean Brow Babineau is a 88 y.o. 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 routine followup of his atrial fibrillation,  paroxysmal/persistent  Last seen by myself in clinic November 2022 Walks with walker, reports having chronic shortness of breath Relatively debilitated, does not go out much Grand-daughter helps with groceries and helps with ADLs  Reports inhalers were changed Remains on Eliquis and metoprolol tartrate 25 twice daily  Previously felt asymptomatic from his atrial fibrillation and had made decision for rate control rather than rhythm control  Remains on Lasix 40 daily, stable BMP  EKG personally reviewed by myself on todays visit Atrial fibrillation with ventricular rate 56 bpm, left axis deviation  Other past medical history reviewed CT scan chest There is diffuse bronchial wall thickening in the bilateral lower lobes, as well as scattered bronchiolar plugging, bandlike scarring and dependent ground-glass. Findings are consistent with sequelae of infection or  aspiration, which Fogleman be ongoing. 2. Cardiomegaly and coronary artery disease. 3. Enlargement of the tubular ascending thoracic aorta, measuring up to 4.0 x 4.0 cm. 4. Enlargement of the main pulmonary artery measuring up to 3.6 cm in caliber, as can be seen in pulmonary hypertension.  Lab work reviewed Total chol 125, LDL 69   Other past medical history reviewed  hospital March 18-21 , 2020 Admitted for sepsis and acute on chronic hypoxic respiratory failure secondary to bronchitis and rhinovirus infection. Initially presented with fever, chills, cough and shortness of breath Sent home with prednisone   Event Monitor Avg HR of 58 bpm. Predominant underlying rhythm was Sinus Rhythm.  Second Degree AV Block-Mobitz I (Wenckebach) was present.  Isolated SVEs were rare (<1.0%), and no SVE Couplets or SVE Triplets were present. Isolated VEs were rare (<1.0%), and no VE Couplets or VE Triplets were present. Patient triggered events were not associated with significant arrhythmia.   Echocardiogram 04/04/2017 with ejection fraction 55-60%, mildly dilated left atrium    Past Medical History:  Diagnosis Date   Anxiety    Arthritis    Atypical chest pain    a. 05/2017 MV: no ischemia, EF 79%.   Chest pain 10/20/2017   Chronic diastolic CHF (congestive heart failure) ([place])    a. 01/2011 Echo: EF 50-55%, gr1 DD, mild AI, nl RV fxn, mild TR/PR; b. 10/2017 Echo: EF 60-65%, mild LVH, gr2 DD.   DDD (degenerative disc disease), cervical    Depressive disorder, not elsewhere classified    Difficult intubation    Dysphagia, oral phase    Dyspnea    Edema    Gallstones    a. Symptomatic - s/p lap  chole 05/2018.   History of DVT (deep vein thrombosis)    History of kidney stones    Hypertension    Hypoxemia    Impacted cerumen    Ischemic colitis ([place])    a. 02/2018 GIB - colonoscopy w/ isch colitis. Anticoagulation resumed.   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 a. 01/2011 in setting of post-op complications including aspiration pna;  b. CHA2DS2VASc = 4--> Amio/Eliquis.   Pneumonia 03/06/2003   Spinal stenosis, unspecified region other than cervical    Squamous cell carcinoma of skin of trunk, except scrotum    skin cancer of shoulder   Syncope    a. 10/2017-->Event monitor: RSR, rare PACs/PVCs.   Thoracic or lumbosacral neuritis or radiculitis, unspecified    Past Surgical History:  Procedure Laterality Date   CARDIOVERSION N/A 06/20/2017   Procedure: CARDIOVERSION;  Surgeon: [name], MD;  Location: [facility] OR[facility];  Service: Cardiovascular;  Laterality: N/A;   CATARACT EXTRACTION W/ INTRAOCULAR LENS  IMPLANT, BILATERAL     CERVICAL FUSION  02/10/2011   CHOLECYSTECTOMY  05/24/2018   CHOLECYSTECTOMY N/A 05/24/2018   Procedure: LAPAROSCOPIC CHOLECYSTECTOMY;  Surgeon: Coralie Keens, MD;  Location: [facility];  Service: General;  Laterality: N/A;   COLONOSCOPY WITH PROPOFOL N/A 02/20/2018   Procedure: COLONOSCOPY WITH PROPOFOL;  Surgeon: Lucilla Lame, MD;  Location: [facility];  Service: Endoscopy;  Laterality: N/A;   history of abd ultrasound  11/01   fatty liver   MULTIPLE TOOTH EXTRACTIONS     ORIF FIBULA FRACTURE Left 01/06/2017   Procedure: OPEN REDUCTION INTERNAL FIXATION (ORIF) FIBULA FRACTURE DISTAL FIBULA;  Surgeon: Melrose Nakayama, MD;  Location: [facility];  Service: Orthopedics;  Laterality: Left;  Patient states has problems if he will have a tube in throat for Genera; Anesthesia     Allergies:   Morphine and related, Percocet [oxycodone-acetaminophen], and Valium   Social History   Tobacco Use   Smoking status: Former    Years: 1.00    Types: Cigarettes   Smokeless tobacco:  Never   Tobacco comments:    stopped in 20's  Vaping Use   Vaping Use: Never used  Substance Use Topics   Alcohol use: No   Drug use: No     Current Outpatient Medications on File Prior to Visit  Medication Sig Dispense Refill   albuterol (PROVENTIL) (2.5 MG/3ML) 0.083% nebulizer solution Take 2.5 mg by nebulization every 6 (six) hours as needed for wheezing or shortness of breath.     cetirizine (ZYRTEC) 10 MG tablet Take 1 tablet (10 mg total) by mouth daily. 90 tablet 3   Cholecalciferol (VITAMIN D) 1000 UNITS capsule Take one tablet once daily 90 capsule 3   ELIQUIS 5 MG TABS tablet TAKE 1 TABLET BY MOUTH TWICE DAILY 180 tablet 3   fluticasone (FLONASE) 50 MCG/ACT nasal spray Place 1 spray into both nostrils daily as needed for allergies or rhinitis. 16 g 3   fluticasone-salmeterol (ADVAIR DISKUS) 250-50 MCG/ACT AEPB Inhale 1 puff into the lungs in the morning and at bedtime. 60 each 2   Fluticasone-Umeclidin-Vilant (TRELEGY ELLIPTA) 200-62.5-25 MCG/ACT AEPB Inhale 1  puff into the lungs daily. 14 each 0   furosemide (LASIX) 40 MG tablet Take 1 tablet (40 mg total) by mouth daily. 90 tablet 3   gabapentin (NEURONTIN) 300 MG capsule TAKE 1 CAPSULE(300 MG) BY MOUTH TWICE DAILY 180 capsule 1   guaiFENesin (MUCINEX) 600 MG 12 hr tablet Take 2 tablets (1,200 mg total) by mouth 2 (two) times daily as needed for cough or to loosen phlegm.     levothyroxine (SYNTHROID) 25 MCG tablet TAKE 1 TABLET(25 MCG) BY MOUTH DAILY BEFORE BREAKFAST 90 tablet 1   metoprolol tartrate (LOPRESSOR) 25 MG tablet TAKE 1 TABLET(25 MG) BY MOUTH TWICE DAILY 180 tablet 3   OXYGEN Inhale 4 L into the lungs at bedtime.      pantoprazole (PROTONIX) 40 MG tablet TAKE 1 TABLET(40 MG) BY MOUTH AT BEDTIME 90 tablet 3   potassium chloride (KLOR-CON) 10 MEQ tablet Take 1 tablet (10 mEq total) by mouth daily. 90 tablet 3   pravastatin (PRAVACHOL) 40 MG tablet TAKE 1 TABLET BY MOUTH EVERY DAY 30 tablet 6    Fluticasone-Umeclidin-Vilant (TRELEGY ELLIPTA) 200-62.5-25 MCG/ACT AEPB Inhale 1 puff into the lungs daily. (Patient not taking: Reported on 11/21/2022) 60 each 5   lidocaine (LIDODERM) 5 % Place 1 patch onto the skin daily. Remove & Discard patch within 12 hours or as directed by MD (Patient not taking: Reported on 11/21/2022) 15 patch 0   No current facility-administered medications on file prior to visit.     Family Hx: The patient's family history includes Atrial fibrillation in his brother; Cancer in an other family member; Heart disease in his brother; Other in his mother; Stroke in his father and paternal grandfather. There is no history of Prostate cancer, Kidney cancer, or Bladder Cancer.  ROS:   Please see the history of present illness.    Review of Systems  Constitutional: Negative.   HENT: Negative.    Respiratory:  Positive for cough.   Cardiovascular: Negative.   Gastrointestinal: Negative.   Musculoskeletal: Negative.   Neurological: Negative.   Psychiatric/Behavioral: Negative.    All other systems reviewed and are negative.    Labs/Other Tests and Data Reviewed:    Recent Labs: 03/11/2022: ALT 48; Hemoglobin 17.1; Platelets 189; TSH 1.66 09/23/2022: BUN 19; Creat 1.07; Potassium 4.9; Sodium 143   Recent Lipid Panel Lab Results  Component Value Date/Time   CHOL 125 03/11/2022 10:51 AM   CHOL 169 05/16/2016 10:35 AM   TRIG 122 03/11/2022 10:51 AM   HDL 36 (L) 03/11/2022 10:51 AM   HDL 37 (L) 05/16/2016 10:35 AM   CHOLHDL 3.5 03/11/2022 10:51 AM   LDLCALC 69 03/11/2022 10:51 AM   LDLDIRECT 97.4 11/29/2010 09:21 AM    Wt Readings from Last 3 Encounters:  11/21/22 184 lb 6 oz (83.6 kg)  10/17/22 184 lb 12.8 oz (83.8 kg)  09/23/22 187 lb (84.8 kg)     Exam:    Vital Signs: Vital signs [place] also be detailed in the HPI BP (!) 150/70 (BP Location: Left Arm, Patient Position: Sitting, Cuff Size: Normal)   Pulse (!) 56   Ht '5\' 6"'$  (1.676 m)   Wt 184 lb 6 oz  (83.6 kg)   SpO2 97%   BMI 29.76 kg/m   Constitutional:  oriented to person, place, and time. No distress.  HENT:  Head: Grossly normal Eyes:  no discharge. No scleral icterus.  Neck: No JVD, no carotid bruits  Cardiovascular: Regular rate and rhythm, no murmurs appreciated  Pulmonary/Chest: Clear to auscultation bilaterally, no wheezes or rails Abdominal: Soft.  no distension.  no tenderness.  Musculoskeletal: Normal range of motion Neurological:  normal muscle tone. Coordination normal. No atrophy Skin: Skin warm and dry Psychiatric: normal affect, pleasant  ASSESSMENT & PLAN:    Problem List Items Addressed This Visit       Cardiology Problems   Ascending aortic aneurysm (HCC)   Mixed hyperlipidemia   PAF (paroxysmal atrial fibrillation) (HCC) - Primary   Chronic diastolic CHF (congestive heart failure) (HCC)     Other   Chronic bronchitis (HCC)   Chronic respiratory failure with hypoxia (HCC)   CKD (chronic kidney disease), stage III ([place])   Other Visit Diagnoses     HYPERTENSION, BENIGN ESSENTIAL          PAF (paroxysmal atrial fibrillation) (HCC) Off amiodarone,  On previous discussion, comfortable with discussion for rate control Recommend he decrease metoprolol to tartrate down to 12.5 twice daily given slow ventricular rate  Dilated ascending aorta Mildly dilated, no further work-up needed   Chronic heart failure with preserved ejection fraction (HCC) Continue Lasix 40 daily with potassium Stable BMP Appears grossly euvolemic   Chronic respiratory distress/COPD Chronic cough in the morning consistent with chronic bronchitis No recent hospitalizations, followed by pulmonary, On inhalers   Mixed hyperlipidemia Cholesterol is at goal on the current lipid regimen. No changes to the medications were made.   HYPERTENSION, BENIGN ESSENTIAL Recommend monitoring blood pressure at home Recent blood pressure measurement with pulmonary well-controlled    Hypothyroidism TSH normal    Total encounter time more than 30 minutes  Greater than 50% was spent in counseling and coordination of care with the patient   Signed, Ida Rogue, [place] [id number], [place], [place] [id number]

## 2022-11-21 ENCOUNTER — Ambulatory Visit: Payer: PPO | Attending: Cardiovascular Disease | Admitting: Cardiovascular Disease

## 2022-11-21 ENCOUNTER — Encounter: Payer: Self-pay | Admitting: Cardiovascular Disease

## 2022-11-21 VITALS — BP 150/70 | HR 56 | Ht 66.0 in | Wt 184.4 lb

## 2022-11-21 DIAGNOSIS — E782 Mixed hyperlipidemia: Secondary | ICD-10-CM

## 2022-11-21 DIAGNOSIS — J9611 Chronic respiratory failure with hypoxia: Secondary | ICD-10-CM

## 2022-11-21 DIAGNOSIS — E039 Hypothyroidism, unspecified: Secondary | ICD-10-CM

## 2022-11-21 DIAGNOSIS — I480 Paroxysmal atrial fibrillation: Secondary | ICD-10-CM | POA: Diagnosis not present

## 2022-11-21 DIAGNOSIS — I10 Essential (primary) hypertension: Secondary | ICD-10-CM

## 2022-11-21 DIAGNOSIS — N1832 Chronic kidney disease, stage 3b: Secondary | ICD-10-CM

## 2022-11-21 DIAGNOSIS — J42 Unspecified chronic bronchitis: Secondary | ICD-10-CM

## 2022-11-21 DIAGNOSIS — I7121 Aneurysm of the ascending aorta, without rupture: Secondary | ICD-10-CM

## 2022-11-21 DIAGNOSIS — I5032 Chronic diastolic (congestive) heart failure: Secondary | ICD-10-CM

## 2022-11-21 DIAGNOSIS — I5033 Acute on chronic diastolic (congestive) heart failure: Secondary | ICD-10-CM

## 2022-11-21 MED ORDER — POTASSIUM CHLORIDE CRYS ER 10 MEQ PO TBCR: 10.0000 meq | EXTENDED_RELEASE_TABLET | Freq: Every day | ORAL | 3 refills | Status: DC

## 2022-11-21 MED ORDER — METOPROLOL TARTRATE 25 MG PO TABS: ORAL_TABLET | ORAL | 3 refills | Status: DC

## 2022-11-21 MED ORDER — PANTOPRAZOLE SODIUM 40 MG PO TBEC: DELAYED_RELEASE_TABLET | ORAL | 3 refills | Status: DC

## 2022-11-21 MED ORDER — FUROSEMIDE 40 MG PO TABS: 40.0000 mg | ORAL_TABLET | Freq: Every day | ORAL | 3 refills | Status: DC

## 2022-11-21 NOTE — Patient Instructions (Addendum)
Medication Instructions:  -Your physician has recommended you make the following change in your medication:   1) DECREASE metoprolol tartrate 25 mg: - take 0.5 tablet (12.5 mg) by mouth twice a day  If you need a refill on your cardiac medications before your next appointment, please call your pharmacy.    Lab work: No new labs needed   Testing/Procedures: No new testing needed   Follow-Up: At [facility], you and your health needs are our priority.  As part of our continuing mission to provide you with exceptional heart care, we have created designated Provider Care Teams.  These Care Teams include your primary Cardiologist (physician) and Advanced Practice Providers (APPs -  Physician Assistants and Nurse Practitioners) who all work together to provide you with the care you need, when you need it.  You will need a follow up appointment in 6 months, APP ok  Providers on your designated Care Team:   Murray Hodgkins, NP Christell Faith, PA-C Cadence Kathlen Mody, [place]  COVID-19 Vaccine Information can be found at: [email or web address] For questions related to vaccine distribution or appointments, please email [email or web address] or call [phone].

## 2022-11-22 NOTE — Addendum Note (Signed)
Addended by: Michel Santee on: 11/22/2022 11:44 AM   Modules accepted: Orders

## 2022-12-15 ENCOUNTER — Ambulatory Visit: Payer: PPO | Admitting: Podiatry

## 2022-12-22 ENCOUNTER — Encounter: Payer: Self-pay | Admitting: Nurse Practitioner

## 2022-12-26 ENCOUNTER — Ambulatory Visit (INDEPENDENT_AMBULATORY_CARE_PROVIDER_SITE_OTHER): Payer: PPO | Admitting: Nurse Practitioner

## 2022-12-26 ENCOUNTER — Encounter: Payer: Self-pay | Admitting: Nurse Practitioner

## 2022-12-26 VITALS — BP 138/74 | HR 78 | Temp 97.8°F | Resp 18 | Ht 66.0 in | Wt 186.6 lb

## 2022-12-26 DIAGNOSIS — I480 Paroxysmal atrial fibrillation: Secondary | ICD-10-CM

## 2022-12-26 DIAGNOSIS — J9611 Chronic respiratory failure with hypoxia: Secondary | ICD-10-CM

## 2022-12-26 DIAGNOSIS — E039 Hypothyroidism, unspecified: Secondary | ICD-10-CM

## 2022-12-26 DIAGNOSIS — E782 Mixed hyperlipidemia: Secondary | ICD-10-CM | POA: Diagnosis not present

## 2022-12-26 DIAGNOSIS — G3281 Cerebellar ataxia in diseases classified elsewhere: Secondary | ICD-10-CM

## 2022-12-26 DIAGNOSIS — I5032 Chronic diastolic (congestive) heart failure: Secondary | ICD-10-CM | POA: Diagnosis not present

## 2022-12-26 DIAGNOSIS — J42 Unspecified chronic bronchitis: Secondary | ICD-10-CM | POA: Diagnosis not present

## 2022-12-26 DIAGNOSIS — Z6830 Body mass index (BMI) 30.0-30.9, adult: Secondary | ICD-10-CM | POA: Diagnosis not present

## 2022-12-26 DIAGNOSIS — E6609 Other obesity due to excess calories: Secondary | ICD-10-CM | POA: Diagnosis not present

## 2022-12-26 DIAGNOSIS — E66811 Obesity, class 1: Secondary | ICD-10-CM

## 2022-12-26 DIAGNOSIS — I10 Essential (primary) hypertension: Secondary | ICD-10-CM

## 2022-12-26 DIAGNOSIS — H6123 Impacted cerumen, bilateral: Secondary | ICD-10-CM

## 2022-12-26 DIAGNOSIS — M48062 Spinal stenosis, lumbar region with neurogenic claudication: Secondary | ICD-10-CM | POA: Diagnosis not present

## 2022-12-26 NOTE — Progress Notes (Deleted)
Careteam: Patient Care Team: Lauree Chandler, NP as PCP - General (Geriatric Medicine) [name], MD as PCP - Cardiology (Cardiology) [name], MD as Consulting Physician (Cardiology) Normajean Glasgow, MD as Attending Physician (Physical Medicine and Rehabilitation) Phylliss Bob, MD as Consulting Physician (Orthopedic Surgery) Wilhelmina Mcardle, MD (Inactive) as Consulting Physician (Pulmonary Disease)  PLACE OF SERVICE:  [place] Directive information Does Patient Have a Medical Advance Directive?: Yes, Type of Advance Directive: [place];Living will;Out of facility DNR (pink MOST or yellow form), Does patient want to make changes to medical advance directive?: No - Patient declined  Allergies  Allergen Reactions   Morphine And Related Shortness Of Breath   Percocet [Oxycodone-Acetaminophen] Shortness Of Breath   Valium Shortness Of Breath    Chief Complaint  Patient presents with   Medical Management of Chronic Issues    3 month follow-up. Discuss need for shingrix and additional covid boosters or post pone if patient refuses of is not a candidate. [facility] verified.      HPI: Patient is a 89 y.o. male here for a routine follow-up.  He follows with Dr. Halford Chessman for his COPD. He is short of breath but this is chronic. It has not worsened. He saw Dr. Halford Chessman in November and changed his medication due to concern for cost. He feels the medication helps. He wears 4L of oxygen at night. He coughs often at night. Recommended he look into a humidifier to see if it helps. He takes mucinex and cough syrup which does help.  He just saw cardiology in December. Denies palpitations, chest pain, dizziness. Minimal swelling in bilateral lower extremities that is chronic. Metoprolol was cut in half at that appointment due to slow ventricular rate.  Denies issues with constipation, sleep, minus occasionally coughing. [employer] is delivering meals. Uses  walker at home for safety. Avoids salt. Weight stable, has gained 2 lbs.   Chronic back pain occasionally, takes gabapentin for this. Unable to have surgery at this time.   No visible bleeding.   He feels that his ears are blocked today.      Review of Systems:  Review of Systems  Constitutional:  Negative for chills, fever, malaise/fatigue and weight loss.  HENT:  Negative for congestion and sore throat.        Positive for pressure in both ears.   Eyes:  Negative for blurred vision.  Respiratory:  Positive for cough and shortness of breath. Negative for wheezing.        Chronic SOB, especially with activity. Unchanged. Chronic cough, worse at night.  Cardiovascular:  Negative for chest pain, palpitations and leg swelling.  Gastrointestinal:  Negative for abdominal pain, blood in stool, constipation, diarrhea, heartburn, nausea and vomiting.  Genitourinary:  Negative for dysuria, frequency, hematuria and urgency.  Musculoskeletal:  Negative for falls and joint pain.  Skin:  Negative for rash.  Neurological:  Negative for dizziness, tingling and headaches.  Endo/Heme/Allergies:  Negative for polydipsia.  Psychiatric/Behavioral:  Negative for depression. The patient is not nervous/anxious.   ***  Past Medical History:  Diagnosis Date   Anxiety    Arthritis    Atypical chest pain    a. 05/2017 MV: no ischemia, EF 79%.   Chest pain 10/20/2017   Chronic diastolic CHF (congestive heart failure) ([place])    a. 01/2011 Echo: EF 50-55%, gr1 DD, mild AI, nl RV fxn, mild TR/PR; b. 10/2017 Echo: EF 60-65%, mild LVH,  gr2 DD.   DDD (degenerative disc disease), cervical    Depressive disorder, not elsewhere classified    Difficult intubation    Dysphagia, oral phase    Dyspnea    Edema    Gallstones    a. Symptomatic - s/p lap chole 05/2018.   History of DVT (deep vein thrombosis)    History of kidney stones    Hypertension    Hypoxemia    Impacted cerumen    Ischemic colitis ([place])     a. 02/2018 GIB - colonoscopy w/ isch colitis. Anticoagulation resumed.   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 a. 01/2011 in setting of post-op complications including aspiration pna;  b. CHA2DS2VASc = 4--> Amio/Eliquis.   Pneumonia 03/06/2003   Spinal stenosis, unspecified region other than cervical    Squamous cell carcinoma of skin of trunk, except scrotum    skin cancer of shoulder   Syncope    a. 10/2017-->Event monitor: RSR, rare PACs/PVCs.   Thoracic or lumbosacral neuritis or radiculitis, unspecified    Past Surgical History:  Procedure Laterality Date   CARDIOVERSION N/A 06/20/2017   Procedure: CARDIOVERSION;  Surgeon: [name], MD;  Location: [facility];  Service: Cardiovascular;  Laterality: N/A;   CATARACT EXTRACTION W/ INTRAOCULAR LENS  IMPLANT, BILATERAL     CERVICAL FUSION  02/10/2011   CHOLECYSTECTOMY  05/24/2018   CHOLECYSTECTOMY N/A 05/24/2018   Procedure: LAPAROSCOPIC CHOLECYSTECTOMY;  Surgeon: Coralie Keens, MD;  Location: [facility];  Service: General;  Laterality: N/A;   COLONOSCOPY WITH PROPOFOL N/A 02/20/2018   Procedure: COLONOSCOPY WITH PROPOFOL;  Surgeon: Lucilla Lame, MD;  Location: [facility];  Service: Endoscopy;  Laterality: N/A;   history of abd ultrasound  11/01   fatty liver   MULTIPLE TOOTH EXTRACTIONS     ORIF FIBULA FRACTURE Left 01/06/2017   Procedure: OPEN REDUCTION INTERNAL FIXATION (ORIF) FIBULA FRACTURE DISTAL FIBULA;  Surgeon: Melrose Nakayama, MD;  Location: [facility];  Service: Orthopedics;  Laterality: Left;  Patient states has problems if he will have a tube in throat for [facility]; Anesthesia   Social History:   reports that he has quit smoking. His smoking use  included cigarettes. He has never used smokeless tobacco. He reports that he does not drink alcohol and does not use drugs.  Family History  Problem Relation Age of Onset   Stroke Father    Atrial fibrillation Brother        on coumadin   Heart disease Brother        AFib- coumadin   Stroke Paternal Grandfather    Other Mother        hemorrhage   Cancer Other        colon cancer at early age   Prostate cancer Neg Hx    Kidney cancer Neg Hx    Bladder Cancer Neg Hx     Medications: Patient's Medications  New Prescriptions   No medications on file  Previous Medications   ALBUTEROL (PROVENTIL) (2.5 MG/3ML) 0.083% NEBULIZER SOLUTION    Take 2.5 mg by nebulization every 6 (six) hours as needed for wheezing or shortness of breath.  CETIRIZINE (ZYRTEC) 10 MG TABLET    Take 1 tablet (10 mg total) by mouth daily.   CHOLECALCIFEROL (VITAMIN D) 1000 UNITS CAPSULE    Take one tablet once daily   ELIQUIS 5 MG TABS TABLET    TAKE 1 TABLET BY MOUTH TWICE DAILY   FLUTICASONE (FLONASE) 50 MCG/ACT NASAL SPRAY    Place 1 spray into both nostrils daily as needed for allergies or rhinitis.   FLUTICASONE-SALMETEROL (ADVAIR DISKUS) 250-50 MCG/ACT AEPB    Inhale 1 puff into the lungs in the morning and at bedtime.   FLUTICASONE-UMECLIDIN-VILANT (TRELEGY ELLIPTA) 200-62.5-25 MCG/ACT AEPB    Inhale 1 puff into the lungs daily.   FUROSEMIDE (LASIX) 40 MG TABLET    Take 1 tablet (40 mg total) by mouth daily.   GABAPENTIN (NEURONTIN) 300 MG CAPSULE    TAKE 1 CAPSULE(300 MG) BY MOUTH TWICE DAILY   GUAIFENESIN (MUCINEX) 600 MG 12 HR TABLET    Take 2 tablets (1,200 mg total) by mouth 2 (two) times daily as needed for cough or to loosen phlegm.   LEVOTHYROXINE (SYNTHROID) 25 MCG TABLET    TAKE 1 TABLET(25 MCG) BY MOUTH DAILY BEFORE BREAKFAST   LIDOCAINE (LIDODERM) 5 %    Place 1 patch onto the skin daily. Remove & Discard patch within 12 hours or as directed by MD   METOPROLOL TARTRATE (LOPRESSOR) 25 MG TABLET     Take 0.5 tablet (12.5 mg) by mouth twice daily   OXYGEN    Inhale 4 L into the lungs at bedtime.    PANTOPRAZOLE (PROTONIX) 40 MG TABLET    TAKE 1 TABLET(40 MG) BY MOUTH AT BEDTIME   POTASSIUM CHLORIDE (KLOR-CON M) 10 MEQ TABLET    Take 1 tablet (10 mEq total) by mouth daily.   PRAVASTATIN (PRAVACHOL) 40 MG TABLET    TAKE 1 TABLET BY MOUTH EVERY DAY  Modified Medications   No medications on file  Discontinued Medications   FLUTICASONE-UMECLIDIN-VILANT (TRELEGY ELLIPTA) 200-62.5-25 MCG/ACT AEPB    Inhale 1 puff into the lungs daily.    Physical Exam:  Vitals:   12/26/22 1042  BP: 138/74  Pulse: 78  Resp: 18  Temp: 97.8 F (36.6 C)  TempSrc: Temporal  SpO2: 94%  Weight: 186 lb 9.6 oz (84.6 kg)  Height: '5\' 6"'$  (1.676 m)   Body mass index is 30.12 kg/m. Wt Readings from Last 3 Encounters:  12/26/22 186 lb 9.6 oz (84.6 kg)  11/21/22 184 lb 6 oz (83.6 kg)  10/17/22 184 lb 12.8 oz (83.8 kg)    Physical Exam Vitals and nursing note reviewed. Exam conducted with a chaperone present.  HENT:     Right Ear: There is impacted cerumen.     Left Ear: There is impacted cerumen.  Cardiovascular:     Rate and Rhythm: Normal rate and regular rhythm.     Heart sounds: Normal heart sounds. No murmur heard. Pulmonary:     Breath sounds: Normal breath sounds. No wheezing or rales.  Abdominal:     General: Bowel sounds are normal.     Palpations: Abdomen is soft. There is no mass.     Tenderness: There is no abdominal tenderness. There is no guarding.  Musculoskeletal:        General: No swelling or tenderness.  Skin:    General: Skin is warm and dry.     Comments: Superficial scratches present on all 4 extremities (pt reports from cat) in various stages of healing. No erythema  or drainage present.   Neurological:     Mental Status: He is alert and oriented to person, place, and time. Mental status is at baseline.     Gait: Gait abnormal.     Comments: Chronic, using walker    Psychiatric:        Mood and Affect: Mood normal.        Behavior: Behavior normal.        Judgment: Judgment normal.   ***  Labs reviewed: Basic Metabolic Panel: Recent Labs    03/11/22 1051 09/23/22 1109  NA 142 143  K 4.9 4.9  CL 104 104  CO2 28 30  GLUCOSE 89 89  BUN 25 19  CREATININE 1.21 1.07  CALCIUM 9.9 9.7  TSH 1.66  --     Liver Function Tests: Recent Labs    03/11/22 1051  AST 44*  ALT 48*  BILITOT 1.4*  PROT 7.4    No results for input(s): "LIPASE", "AMYLASE" in the last 8760 hours. No results for input(s): "AMMONIA" in the last 8760 hours. CBC: Recent Labs    03/11/22 1051  WBC 6.4  NEUTROABS 3,738  HGB 17.1  HCT 52.0*  MCV 95.1  PLT 189    Lipid Panel: Recent Labs    03/11/22 1051  CHOL 125  HDL 36*  LDLCALC 69  TRIG 122  CHOLHDL 3.5    TSH: Recent Labs    03/11/22 1051  TSH 1.66    A1C: Lab Results  Component Value Date   HGBA1C 5.7 (H) 05/16/2016     Assessment/Plan There are no diagnoses linked to this encounter.  Return in about 6 months (around 06/26/2023) for routine follow up .:  1. Chronic diastolic CHF (congestive heart failure) (HCC) Denies increase in swelling, worsening SOB. Follows with cardiology. Continue metoprolol and lasix.  - COMPLETE METABOLIC PANEL WITH GFR - CBC with Differential/Platelet  2. Essential hypertension Controlled. Pt continues to eat food provided by [employer]. He does not add additional salt to his food. Continue metoprolol.  3. Paroxysmal atrial fibrillation ([place]) Follows with cardiology. Denies palpitations or chest pain. Continue eliquis, metoprolol as directed by cardiology. - COMPLETE METABOLIC PANEL WITH GFR - CBC with Differential/Platelet  4. Hypothyroidism, unspecified type Stable. Continue levothyroxine.  - TSH  5. Mixed hyperlipidemia Continue pravastatin. Patient is unable to cook for himself or engage in much physical activity due to chronic SOB from  COPD. - Lipid panel  6. Spinal stenosis, lumbar region, with neurogenic claudication Gabapentin is helping. Did see neuro in past, not a candidate for surgery.   7. Bilateral hearing loss due to cerumen impaction Ear lavage performed today. Patient expresses relief of pressure and return of hearing.    Student- Archer Asa O'Berry ACPCNP-S  I personally was present during the history, physical exam and medical decision-making activities of this service and have verified that the service and findings are accurately documented in the student's note Jessica K. [place], [facility] [phone]

## 2022-12-26 NOTE — Progress Notes (Signed)
Careteam: Patient Care Team: Lauree Chandler, NP as PCP - General (Geriatric Medicine) [name], MD as PCP - Cardiology (Cardiology) [name], MD as Consulting Physician (Cardiology) Normajean Glasgow, MD as Attending Physician (Physical Medicine and Rehabilitation) Phylliss Bob, MD as Consulting Physician (Orthopedic Surgery) Wilhelmina Mcardle, MD (Inactive) as Consulting Physician (Pulmonary Disease)  PLACE OF SERVICE:  [place] Directive information Does Patient Have a Medical Advance Directive?: Yes, Type of Advance Directive: [place];Living will;Out of facility DNR (pink MOST or yellow form), Does patient want to make changes to medical advance directive?: No - Patient declined  Allergies  Allergen Reactions   Morphine And Related Shortness Of Breath   Percocet [Oxycodone-Acetaminophen] Shortness Of Breath   Valium Shortness Of Breath    Chief Complaint  Patient presents with   Medical Management of Chronic Issues    3 month follow-up. Discuss need for shingrix and additional covid boosters or post pone if patient refuses of is not a candidate. [facility] verified.      HPI: Patient is a 89 y.o. male here for a routine follow-up.  He follows with Dr. Halford Chessman for his COPD. He is short of breath but this is chronic. It has not worsened. He saw Dr. Halford Chessman in November and changed his medication due to concern for cost. He feels the medication helps. He wears 4L of oxygen at night. He coughs often at night.  He takes mucinex and cough syrup which does help.  He just saw cardiology in December. Denies palpitations, chest pain, dizziness. Minimal swelling in bilateral lower extremities that is chronic. Metoprolol was cut in half at that appointment due to slow ventricular rate.  Denies issues with constipation, sleep, minus occasionally coughing. [employer] is delivering meals. Uses walker at home for safety. Avoids salt. Weight stable,  has gained 2 lbs.   Chronic back pain occasionally, takes gabapentin for this. Unable to have surgery at this time.   No visible bleeding.   He feels that his ears are blocked today.      Review of Systems:  Review of Systems  Constitutional:  Negative for chills, fever, malaise/fatigue and weight loss.  HENT:  Negative for congestion and sore throat.        Positive for pressure in both ears.   Eyes:  Negative for blurred vision.  Respiratory:  Positive for cough and shortness of breath. Negative for wheezing.        Chronic SOB, especially with activity. Unchanged. Chronic cough, worse at night.  Cardiovascular:  Negative for chest pain, palpitations and leg swelling.  Gastrointestinal:  Negative for abdominal pain, blood in stool, constipation, diarrhea, heartburn, nausea and vomiting.  Genitourinary:  Negative for dysuria, frequency, hematuria and urgency.  Musculoskeletal:  Negative for falls and joint pain.  Skin:  Negative for rash.  Neurological:  Negative for dizziness, tingling and headaches.  Endo/Heme/Allergies:  Negative for polydipsia.  Psychiatric/Behavioral:  Negative for depression. The patient is not nervous/anxious.     Past Medical History:  Diagnosis Date   Anxiety    Arthritis    Atypical chest pain    a. 05/2017 MV: no ischemia, EF 79%.   Chest pain 10/20/2017   Chronic diastolic CHF (congestive heart failure) ([place])    a. 01/2011 Echo: EF 50-55%, gr1 DD, mild AI, nl RV fxn, mild TR/PR; b. 10/2017 Echo: EF 60-65%, mild LVH, gr2 DD.   DDD (degenerative disc disease), cervical  Depressive disorder, not elsewhere classified    Difficult intubation    Dysphagia, oral phase    Dyspnea    Edema    Gallstones    a. Symptomatic - s/p lap chole 05/2018.   History of DVT (deep vein thrombosis)    History of kidney stones    Hypertension    Hypoxemia    Impacted cerumen    Ischemic colitis ([place])    a. 02/2018 GIB - colonoscopy w/ isch colitis.  Anticoagulation resumed.   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 a. 01/2011 in setting of post-op complications including aspiration pna;  b. CHA2DS2VASc = 4--> Amio/Eliquis.   Pneumonia 03/06/2003   Spinal stenosis, unspecified region other than cervical    Squamous cell carcinoma of skin of trunk, except scrotum    skin cancer of shoulder   Syncope    a. 10/2017-->Event monitor: RSR, rare PACs/PVCs.   Thoracic or lumbosacral neuritis or radiculitis, unspecified    Past Surgical History:  Procedure Laterality Date   CARDIOVERSION N/A 06/20/2017   Procedure: CARDIOVERSION;  Surgeon: [name], MD;  Location: [facility];  Service: Cardiovascular;  Laterality: N/A;   CATARACT EXTRACTION W/ INTRAOCULAR LENS  IMPLANT, BILATERAL     CERVICAL FUSION  02/10/2011   CHOLECYSTECTOMY  05/24/2018   CHOLECYSTECTOMY N/A 05/24/2018   Procedure: LAPAROSCOPIC CHOLECYSTECTOMY;  Surgeon: Coralie Keens, MD;  Location: [facility];  Service: General;  Laterality: N/A;   COLONOSCOPY WITH PROPOFOL N/A 02/20/2018   Procedure: COLONOSCOPY WITH PROPOFOL;  Surgeon: Lucilla Lame, MD;  Location: [facility];  Service: Endoscopy;  Laterality: N/A;   history of abd ultrasound  11/01   fatty liver   MULTIPLE TOOTH EXTRACTIONS     ORIF FIBULA FRACTURE Left 01/06/2017   Procedure: OPEN REDUCTION INTERNAL FIXATION (ORIF) FIBULA FRACTURE DISTAL FIBULA;  Surgeon: Melrose Nakayama, MD;  Location: [facility];  Service: Orthopedics;  Laterality: Left;  Patient states has problems if he will have a tube in throat for [facility]; Anesthesia   Social History:   reports that he has quit smoking. His smoking use included cigarettes. He has never used  smokeless tobacco. He reports that he does not drink alcohol and does not use drugs.  Family History  Problem Relation Age of Onset   Stroke Father    Atrial fibrillation Brother        on coumadin   Heart disease Brother        AFib- coumadin   Stroke Paternal Grandfather    Other Mother        hemorrhage   Cancer Other        colon cancer at early age   Prostate cancer Neg Hx    Kidney cancer Neg Hx    Bladder Cancer Neg Hx     Medications: Patient's Medications  New Prescriptions   No medications on file  Previous Medications   ALBUTEROL (PROVENTIL) (2.5 MG/3ML) 0.083% NEBULIZER SOLUTION    Take 2.5 mg by nebulization every 6 (six) hours as needed for wheezing or shortness of breath.   CETIRIZINE (ZYRTEC) 10 MG TABLET    Take 1 tablet (  10 mg total) by mouth daily.   CHOLECALCIFEROL (VITAMIN D) 1000 UNITS CAPSULE    Take one tablet once daily   ELIQUIS 5 MG TABS TABLET    TAKE 1 TABLET BY MOUTH TWICE DAILY   FLUTICASONE (FLONASE) 50 MCG/ACT NASAL SPRAY    Place 1 spray into both nostrils daily as needed for allergies or rhinitis.   FLUTICASONE-SALMETEROL (ADVAIR DISKUS) 250-50 MCG/ACT AEPB    Inhale 1 puff into the lungs in the morning and at bedtime.   FLUTICASONE-UMECLIDIN-VILANT (TRELEGY ELLIPTA) 200-62.5-25 MCG/ACT AEPB    Inhale 1 puff into the lungs daily.   FUROSEMIDE (LASIX) 40 MG TABLET    Take 1 tablet (40 mg total) by mouth daily.   GABAPENTIN (NEURONTIN) 300 MG CAPSULE    TAKE 1 CAPSULE(300 MG) BY MOUTH TWICE DAILY   GUAIFENESIN (MUCINEX) 600 MG 12 HR TABLET    Take 2 tablets (1,200 mg total) by mouth 2 (two) times daily as needed for cough or to loosen phlegm.   LEVOTHYROXINE (SYNTHROID) 25 MCG TABLET    TAKE 1 TABLET(25 MCG) BY MOUTH DAILY BEFORE BREAKFAST   LIDOCAINE (LIDODERM) 5 %    Place 1 patch onto the skin daily. Remove & Discard patch within 12 hours or as directed by MD   METOPROLOL TARTRATE (LOPRESSOR) 25 MG TABLET    Take 0.5 tablet (12.5 mg) by mouth  twice daily   OXYGEN    Inhale 4 L into the lungs at bedtime.    PANTOPRAZOLE (PROTONIX) 40 MG TABLET    TAKE 1 TABLET(40 MG) BY MOUTH AT BEDTIME   POTASSIUM CHLORIDE (KLOR-CON M) 10 MEQ TABLET    Take 1 tablet (10 mEq total) by mouth daily.   PRAVASTATIN (PRAVACHOL) 40 MG TABLET    TAKE 1 TABLET BY MOUTH EVERY DAY  Modified Medications   No medications on file  Discontinued Medications   FLUTICASONE-UMECLIDIN-VILANT (TRELEGY ELLIPTA) 200-62.5-25 MCG/ACT AEPB    Inhale 1 puff into the lungs daily.    Physical Exam:  Vitals:   12/26/22 1042  BP: 138/74  Pulse: 78  Resp: 18  Temp: 97.8 F (36.6 C)  TempSrc: Temporal  SpO2: 94%  Weight: 186 lb 9.6 oz (84.6 kg)  Height: '5\' 6"'$  (1.676 m)   Body mass index is 30.12 kg/m. Wt Readings from Last 3 Encounters:  12/26/22 186 lb 9.6 oz (84.6 kg)  11/21/22 184 lb 6 oz (83.6 kg)  10/17/22 184 lb 12.8 oz (83.8 kg)    Physical Exam Vitals and nursing note reviewed. Exam conducted with a chaperone present.  HENT:     Right Ear: There is impacted cerumen.     Left Ear: There is impacted cerumen.  Cardiovascular:     Rate and Rhythm: Normal rate and regular rhythm.     Heart sounds: Normal heart sounds. No murmur heard. Pulmonary:     Breath sounds: Normal breath sounds. No wheezing or rales.  Abdominal:     General: Bowel sounds are normal.     Palpations: Abdomen is soft. There is no mass.     Tenderness: There is no abdominal tenderness. There is no guarding.  Musculoskeletal:        General: No swelling or tenderness.  Skin:    General: Skin is warm and dry.     Comments: Superficial scratches present on all 4 extremities (pt reports from cat) in various stages of healing. No erythema or drainage present.   Neurological:     Mental  Status: He is alert and oriented to person, place, and time. Mental status is at baseline.     Gait: Gait abnormal.     Comments: Chronic, using walker   Psychiatric:        Mood and Affect: Mood  normal.        Behavior: Behavior normal.        Judgment: Judgment normal.     Labs reviewed: Basic Metabolic Panel: Recent Labs    03/11/22 1051 09/23/22 1109  NA 142 143  K 4.9 4.9  CL 104 104  CO2 28 30  GLUCOSE 89 89  BUN 25 19  CREATININE 1.21 1.07  CALCIUM 9.9 9.7  TSH 1.66  --    Liver Function Tests: Recent Labs    03/11/22 1051  AST 44*  ALT 48*  BILITOT 1.4*  PROT 7.4   No results for input(s): "LIPASE", "AMYLASE" in the last 8760 hours. No results for input(s): "AMMONIA" in the last 8760 hours. CBC: Recent Labs    03/11/22 1051  WBC 6.4  NEUTROABS 3,738  HGB 17.1  HCT 52.0*  MCV 95.1  PLT 189   Lipid Panel: Recent Labs    03/11/22 1051  CHOL 125  HDL 36*  LDLCALC 69  TRIG 122  CHOLHDL 3.5   TSH: Recent Labs    03/11/22 1051  TSH 1.66   A1C: Lab Results  Component Value Date   HGBA1C 5.7 (H) 05/16/2016     Assessment/Plan There are no diagnoses linked to this encounter.  No follow-ups on file.:  1. Chronic diastolic CHF (congestive heart failure) ([place]) Denies increase in swelling, worsening SOB. Follows with cardiology. Continue metoprolol and lasix.  - COMPLETE METABOLIC PANEL WITH GFR - CBC with Differential/Platelet  2. Essential hypertension Controlled. Pt continues to eat food provided by [employer]. He does not add additional salt to his food. Continue metoprolol.  3. Paroxysmal atrial fibrillation ([place]) Follows with cardiology. Denies palpitations or chest pain. Continue eliquis, metoprolol as directed by cardiology. - COMPLETE METABOLIC PANEL WITH GFR - CBC with Differential/Platelet  4. Hypothyroidism, unspecified type Stable. Continue levothyroxine.  - TSH  5. Mixed hyperlipidemia Continue pravastatin. Patient is unable to cook for himself or engage in much physical activity due to chronic SOB from COPD. - Lipid panel  6. Spinal stenosis, lumbar region, with neurogenic claudication Gabapentin is  helping. Did see neuro in past, not a candidate for surgery.   7. Bilateral hearing loss due to cerumen impaction Ear lavage performed today unable to remove wax totally from right ear, recommend debrox drops then to flush Removed from left ear with curette, pt tolerated well.   8. Chronic respiratory failure with hypoxia ([facility]) Continues on 02 at night  Recommended he look into a humidifier to help with dryness  9. Cerebellar ataxia in diseases classified elsewhere ([place]) -stable, continues to use walker for support.   10. Chronic bronchitis, unspecified chronic bronchitis type ([place]) -stable on current regimen, will continue as directed with O2 qhs  11. Class 1 obesity due to excess calories with body mass index (BMI) of 30.0 to 30.9 in adult, unspecified whether serious comorbidity present -increase in physical activity as tolerates and proper nutrition with portion control- expect weight loss with aging.   Student- Archer Asa O'Berry ACPCNP-S  I personally was present during the history, physical exam and medical decision-making activities of this service and have verified that the service and findings are accurately documented in the student's note Janett Billow  Beaulah Corin, [facility] [phone]

## 2022-12-26 NOTE — Progress Notes (Deleted)
Careteam: Patient Care Team: Lauree Chandler, NP as PCP - General (Geriatric Medicine) [name], MD as PCP - Cardiology (Cardiology) [name], MD as Consulting Physician (Cardiology) Normajean Glasgow, MD as Attending Physician (Physical Medicine and Rehabilitation) Phylliss Bob, MD as Consulting Physician (Orthopedic Surgery) Wilhelmina Mcardle, MD (Inactive) as Consulting Physician (Pulmonary Disease)  PLACE OF SERVICE:  [place] Directive information Does Patient Have a Medical Advance Directive?: Yes, Type of Advance Directive: [place];Living will;Out of facility DNR (pink MOST or yellow form), Does patient want to make changes to medical advance directive?: No - Patient declined  Allergies  Allergen Reactions   Morphine And Related Shortness Of Breath   Percocet [Oxycodone-Acetaminophen] Shortness Of Breath   Valium Shortness Of Breath    Chief Complaint  Patient presents with   Medical Management of Chronic Issues    3 month follow-up. Discuss need for shingrix and additional covid boosters or post pone if patient refuses of is not a candidate. [facility] verified.      HPI: Patient is a 89 y.o. male here for a routine follow-up.  He follows with Dr. Halford Chessman for his COPD. He is short of breath but this is chronic. It has not worsened. He saw Dr. Halford Chessman in November and changed his medication due to concern for cost. He feels the medication helps. He wears 4L of oxygen at night. He coughs often at night. Recommended he look into a humidifier to see if it helps. He takes mucinex and cough syrup which does help.  He just saw cardiology in December. Denies palpitations, chest pain, dizziness. Minimal swelling in bilateral lower extremities that is chronic. Metoprolol was cut in half at that appointment due to slow ventricular rate.  Denies issues with constipation, sleep, minus occasionally coughing. [employer] is delivering meals. Uses  walker at home for safety. Avoids salt. Weight stable, has gained 2 lbs.   Chronic back pain occasionally, takes gabapentin for this. Unable to have surgery at this time.   No visible bleeding.   He feels that his ears are blocked today.      Review of Systems:  Review of Systems  Constitutional:  Negative for chills, fever, malaise/fatigue and weight loss.  HENT:  Negative for congestion and sore throat.        Positive for pressure in both ears.   Eyes:  Negative for blurred vision.  Respiratory:  Positive for cough and shortness of breath. Negative for wheezing.        Chronic SOB, especially with activity. Unchanged. Chronic cough, worse at night.  Cardiovascular:  Negative for chest pain, palpitations and leg swelling.  Gastrointestinal:  Negative for abdominal pain, blood in stool, constipation, diarrhea, heartburn, nausea and vomiting.  Genitourinary:  Negative for dysuria, frequency, hematuria and urgency.  Musculoskeletal:  Negative for falls and joint pain.  Skin:  Negative for rash.  Neurological:  Negative for dizziness, tingling and headaches.  Endo/Heme/Allergies:  Negative for polydipsia.  Psychiatric/Behavioral:  Negative for depression. The patient is not nervous/anxious.   ***  Past Medical History:  Diagnosis Date   Anxiety    Arthritis    Atypical chest pain    a. 05/2017 MV: no ischemia, EF 79%.   Chest pain 10/20/2017   Chronic diastolic CHF (congestive heart failure) ([place])    a. 01/2011 Echo: EF 50-55%, gr1 DD, mild AI, nl RV fxn, mild TR/PR; b. 10/2017 Echo: EF 60-65%, mild LVH,  gr2 DD.   DDD (degenerative disc disease), cervical    Depressive disorder, not elsewhere classified    Difficult intubation    Dysphagia, oral phase    Dyspnea    Edema    Gallstones    a. Symptomatic - s/p lap chole 05/2018.   History of DVT (deep vein thrombosis)    History of kidney stones    Hypertension    Hypoxemia    Impacted cerumen    Ischemic colitis ([place])     a. 02/2018 GIB - colonoscopy w/ isch colitis. Anticoagulation resumed.   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 a. 01/2011 in setting of post-op complications including aspiration pna;  b. CHA2DS2VASc = 4--> Amio/Eliquis.   Pneumonia 03/06/2003   Spinal stenosis, unspecified region other than cervical    Squamous cell carcinoma of skin of trunk, except scrotum    skin cancer of shoulder   Syncope    a. 10/2017-->Event monitor: RSR, rare PACs/PVCs.   Thoracic or lumbosacral neuritis or radiculitis, unspecified    Past Surgical History:  Procedure Laterality Date   CARDIOVERSION N/A 06/20/2017   Procedure: CARDIOVERSION;  Surgeon: [name], MD;  Location: [facility];  Service: Cardiovascular;  Laterality: N/A;   CATARACT EXTRACTION W/ INTRAOCULAR LENS  IMPLANT, BILATERAL     CERVICAL FUSION  02/10/2011   CHOLECYSTECTOMY  05/24/2018   CHOLECYSTECTOMY N/A 05/24/2018   Procedure: LAPAROSCOPIC CHOLECYSTECTOMY;  Surgeon: Coralie Keens, MD;  Location: [place];  Service: General;  Laterality: N/A;   COLONOSCOPY WITH PROPOFOL N/A 02/20/2018   Procedure: COLONOSCOPY WITH PROPOFOL;  Surgeon: Lucilla Lame, MD;  Location: [facility];  Service: Endoscopy;  Laterality: N/A;   history of abd ultrasound  11/01   fatty liver   MULTIPLE TOOTH EXTRACTIONS     ORIF FIBULA FRACTURE Left 01/06/2017   Procedure: OPEN REDUCTION INTERNAL FIXATION (ORIF) FIBULA FRACTURE DISTAL FIBULA;  Surgeon: Melrose Nakayama, MD;  Location: [place];  Service: Orthopedics;  Laterality: Left;  Patient states has problems if he will have a tube in throat for Genera; Anesthesia   Social History:   reports that he has quit smoking. His smoking use  included cigarettes. He has never used smokeless tobacco. He reports that he does not drink alcohol and does not use drugs.  Family History  Problem Relation Age of Onset   Stroke Father    Atrial fibrillation Brother        on coumadin   Heart disease Brother        AFib- coumadin   Stroke Paternal Grandfather    Other Mother        hemorrhage   Cancer Other        colon cancer at early age   Prostate cancer Neg Hx    Kidney cancer Neg Hx    Bladder Cancer Neg Hx     Medications: Patient's Medications  New Prescriptions   No medications on file  Previous Medications   ALBUTEROL (PROVENTIL) (2.5 MG/3ML) 0.083% NEBULIZER SOLUTION    Take 2.5 mg by nebulization every 6 (six) hours as needed for wheezing or shortness of breath.  CETIRIZINE (ZYRTEC) 10 MG TABLET    Take 1 tablet (10 mg total) by mouth daily.   CHOLECALCIFEROL (VITAMIN D) 1000 UNITS CAPSULE    Take one tablet once daily   ELIQUIS 5 MG TABS TABLET    TAKE 1 TABLET BY MOUTH TWICE DAILY   FLUTICASONE (FLONASE) 50 MCG/ACT NASAL SPRAY    Place 1 spray into both nostrils daily as needed for allergies or rhinitis.   FLUTICASONE-SALMETEROL (ADVAIR DISKUS) 250-50 MCG/ACT AEPB    Inhale 1 puff into the lungs in the morning and at bedtime.   FLUTICASONE-UMECLIDIN-VILANT (TRELEGY ELLIPTA) 200-62.5-25 MCG/ACT AEPB    Inhale 1 puff into the lungs daily.   FUROSEMIDE (LASIX) 40 MG TABLET    Take 1 tablet (40 mg total) by mouth daily.   GABAPENTIN (NEURONTIN) 300 MG CAPSULE    TAKE 1 CAPSULE(300 MG) BY MOUTH TWICE DAILY   GUAIFENESIN (MUCINEX) 600 MG 12 HR TABLET    Take 2 tablets (1,200 mg total) by mouth 2 (two) times daily as needed for cough or to loosen phlegm.   LEVOTHYROXINE (SYNTHROID) 25 MCG TABLET    TAKE 1 TABLET(25 MCG) BY MOUTH DAILY BEFORE BREAKFAST   LIDOCAINE (LIDODERM) 5 %    Place 1 patch onto the skin daily. Remove & Discard patch within 12 hours or as directed by MD   METOPROLOL TARTRATE (LOPRESSOR) 25 MG TABLET     Take 0.5 tablet (12.5 mg) by mouth twice daily   OXYGEN    Inhale 4 L into the lungs at bedtime.    PANTOPRAZOLE (PROTONIX) 40 MG TABLET    TAKE 1 TABLET(40 MG) BY MOUTH AT BEDTIME   POTASSIUM CHLORIDE (KLOR-CON M) 10 MEQ TABLET    Take 1 tablet (10 mEq total) by mouth daily.   PRAVASTATIN (PRAVACHOL) 40 MG TABLET    TAKE 1 TABLET BY MOUTH EVERY DAY  Modified Medications   No medications on file  Discontinued Medications   FLUTICASONE-UMECLIDIN-VILANT (TRELEGY ELLIPTA) 200-62.5-25 MCG/ACT AEPB    Inhale 1 puff into the lungs daily.    Physical Exam:  Vitals:   12/26/22 1042  BP: 138/74  Pulse: 78  Resp: 18  Temp: 97.8 F (36.6 C)  TempSrc: Temporal  SpO2: 94%  Weight: 186 lb 9.6 oz (84.6 kg)  Height: '5\' 6"'$  (1.676 m)   Body mass index is 30.12 kg/m. Wt Readings from Last 3 Encounters:  12/26/22 186 lb 9.6 oz (84.6 kg)  11/21/22 184 lb 6 oz (83.6 kg)  10/17/22 184 lb 12.8 oz (83.8 kg)    Physical Exam Vitals and nursing note reviewed. Exam conducted with a chaperone present.  HENT:     Right Ear: There is impacted cerumen.     Left Ear: There is impacted cerumen.  Cardiovascular:     Rate and Rhythm: Normal rate and regular rhythm.     Heart sounds: Normal heart sounds. No murmur heard. Pulmonary:     Breath sounds: Normal breath sounds. No wheezing or rales.  Abdominal:     General: Bowel sounds are normal.     Palpations: Abdomen is soft. There is no mass.     Tenderness: There is no abdominal tenderness. There is no guarding.  Musculoskeletal:        General: No swelling or tenderness.  Skin:    General: Skin is warm and dry.     Comments: Superficial scratches present on all 4 extremities (pt reports from cat) in various stages of healing. No erythema  or drainage present.   Neurological:     Mental Status: He is alert and oriented to person, place, and time. Mental status is at baseline.     Gait: Gait abnormal.     Comments: Chronic, using walker    Psychiatric:        Mood and Affect: Mood normal.        Behavior: Behavior normal.        Judgment: Judgment normal.   ***  Labs reviewed: Basic Metabolic Panel: Recent Labs    03/11/22 1051 09/23/22 1109  NA 142 143  K 4.9 4.9  CL 104 104  CO2 28 30  GLUCOSE 89 89  BUN 25 19  CREATININE 1.21 1.07  CALCIUM 9.9 9.7  TSH 1.66  --     Liver Function Tests: Recent Labs    03/11/22 1051  AST 44*  ALT 48*  BILITOT 1.4*  PROT 7.4    No results for input(s): "LIPASE", "AMYLASE" in the last 8760 hours. No results for input(s): "AMMONIA" in the last 8760 hours. CBC: Recent Labs    03/11/22 1051  WBC 6.4  NEUTROABS 3,738  HGB 17.1  HCT 52.0*  MCV 95.1  PLT 189    Lipid Panel: Recent Labs    03/11/22 1051  CHOL 125  HDL 36*  LDLCALC 69  TRIG 122  CHOLHDL 3.5    TSH: Recent Labs    03/11/22 1051  TSH 1.66    A1C: Lab Results  Component Value Date   HGBA1C 5.7 (H) 05/16/2016     Assessment/Plan There are no diagnoses linked to this encounter.  Return in about 6 months (around 06/26/2023) for routine follow up .:  1. Chronic diastolic CHF (congestive heart failure) (H[facility]) Denies increase in swelling, worsening SOB. Follows with cardiology. Continue metoprolol and lasix.  - COMPLETE METABOLIC PANEL WITH GFR - CBC with Differential/Platelet  2. Essential hypertension Controlled. Pt continues to eat food provided by [employer]. He does not add additional salt to his food. Continue metoprolol.  3. Paroxysmal atrial fibrillation ([place]) Follows with cardiology. Denies palpitations or chest pain. Continue eliquis, metoprolol as directed by cardiology. - COMPLETE METABOLIC PANEL WITH GFR - CBC with Differential/Platelet  4. Hypothyroidism, unspecified type Stable. Continue levothyroxine.  - TSH  5. Mixed hyperlipidemia Continue pravastatin. Patient is unable to cook for himself or engage in much physical activity due to chronic SOB from  COPD. - Lipid panel  6. Spinal stenosis, lumbar region, with neurogenic claudication Gabapentin is helping. Did see neuro in past, not a candidate for surgery.   7. Bilateral hearing loss due to cerumen impaction Ear lavage performed today. Patient expresses relief of pressure and return of hearing.    Student- Archer Asa O'Berry ACPCNP-S  I personally was present during the history, physical exam and medical decision-making activities of this service and have verified that the service and findings are accurately documented in the student's note Jessica K. [place], [facility] [phone]

## 2022-12-27 LAB — COMPLETE METABOLIC PANEL WITH GFR
AG Ratio: 1.4 (calc) (ref 1.0–2.5)
ALT: 30 U/L (ref 9–46)
AST: 34 U/L (ref 10–35)
Albumin: 4.4 g/dL (ref 3.6–5.1)
Alkaline phosphatase (APISO): 54 U/L (ref 35–144)
BUN: 19 mg/dL (ref 7–25)
CO2: 27 mmol/L (ref 20–32)
Calcium: 9.7 mg/dL (ref 8.6–10.3)
Chloride: 107 mmol/L (ref 98–110)
Creat: 1.03 mg/dL (ref 0.70–1.22)
Globulin: 3.1 g/dL (calc) (ref 1.9–3.7)
Glucose, Bld: 79 mg/dL (ref 65–99)
Potassium: 4.5 mmol/L (ref 3.5–5.3)
Sodium: 144 mmol/L (ref 135–146)
Total Bilirubin: 1.3 mg/dL — ABNORMAL HIGH (ref 0.2–1.2)
Total Protein: 7.5 g/dL (ref 6.1–8.1)
eGFR: 70 mL/min/{1.73_m2} (ref >=60)

## 2022-12-27 LAB — CBC WITH DIFFERENTIAL/PLATELET
Absolute Monocytes: 563 cells/uL (ref 200–950)
Basophils Absolute: 58 cells/uL (ref 0–200)
Basophils Relative: 0.9 %
Eosinophils Absolute: 70 cells/uL (ref 15–500)
Eosinophils Relative: 1.1 %
HCT: 45.4 % (ref 38.5–50.0)
Hemoglobin: 15.3 g/dL (ref 13.2–17.1)
Lymphs Abs: 2272 cells/uL (ref 850–3900)
MCH: 32.6 pg (ref 27.0–33.0)
MCHC: 33.7 g/dL (ref 32.0–36.0)
MCV: 96.8 fL (ref 80.0–100.0)
MPV: 10.5 fL (ref 7.5–12.5)
Monocytes Relative: 8.8 %
Neutro Abs: 3437 cells/uL (ref 1500–7800)
Neutrophils Relative %: 53.7 %
Platelets: 190 10*3/uL (ref 140–400)
RBC: 4.69 10*6/uL (ref 4.20–5.80)
RDW: 12 % (ref 11.0–15.0)
Total Lymphocyte: 35.5 %
WBC: 6.4 10*3/uL (ref 3.8–10.8)

## 2022-12-27 LAB — LIPID PANEL
Cholesterol: 110 mg/dL (ref <200)
HDL: 33 mg/dL — ABNORMAL LOW (ref >=40)
LDL Cholesterol (Calc): 54 mg/dL (calc)
Non-HDL Cholesterol (Calc): 77 mg/dL (calc) (ref <130)
Total CHOL/HDL Ratio: 3.3 (calc) (ref <5.0)
Triglycerides: 143 mg/dL (ref <150)

## 2022-12-27 LAB — TSH: TSH: 1.57 mIU/L (ref 0.40–4.50)

## 2023-01-02 ENCOUNTER — Other Ambulatory Visit: Payer: Self-pay | Admitting: Nurse Practitioner

## 2023-01-09 ENCOUNTER — Other Ambulatory Visit: Payer: Self-pay

## 2023-01-09 MED ORDER — ALBUTEROL SULFATE (2.5 MG/3ML) 0.083% IN NEBU: 2.5000 mg | INHALATION_SOLUTION | Freq: Four times a day (QID) | RESPIRATORY_TRACT | 4 refills | Status: DC | PRN

## 2023-01-09 NOTE — Telephone Encounter (Signed)
Patient called requesting an rx for albuterol nebulizer and medication never filled by [facility].

## 2023-01-23 ENCOUNTER — Telehealth: Payer: Self-pay

## 2023-01-23 MED ORDER — FLUTICASONE-SALMETEROL 250-50 MCG/ACT IN AEPB: 1.0000 | INHALATION_SPRAY | Freq: Two times a day (BID) | RESPIRATORY_TRACT | 2 refills | Status: DC

## 2023-01-24 ENCOUNTER — Encounter: Payer: Self-pay | Admitting: Podiatry

## 2023-01-24 ENCOUNTER — Ambulatory Visit (INDEPENDENT_AMBULATORY_CARE_PROVIDER_SITE_OTHER): Payer: PPO | Admitting: Podiatry

## 2023-01-24 DIAGNOSIS — B351 Tinea unguium: Secondary | ICD-10-CM | POA: Diagnosis not present

## 2023-01-24 DIAGNOSIS — D689 Coagulation defect, unspecified: Secondary | ICD-10-CM | POA: Diagnosis not present

## 2023-01-24 DIAGNOSIS — M79675 Pain in left toe(s): Secondary | ICD-10-CM | POA: Diagnosis not present

## 2023-01-24 DIAGNOSIS — M79674 Pain in right toe(s): Secondary | ICD-10-CM | POA: Diagnosis not present

## 2023-01-24 NOTE — Progress Notes (Signed)
This patient returns to my office for at risk foot care.  This patient requires this care by a professional since this patient will be at risk due to having coagulation defect.  This patient is unable to cut nails himself since the patient cannot reach his nails.These nails are painful walking and wearing shoes.  This patient presents for at risk foot care today.  General Appearance  Alert, conversant and in no acute stress.  Vascular  Dorsalis pedis and posterior tibial  pulses are  weakly palpable  bilaterally.  Capillary return is within normal limits  bilaterally. Temperature is within normal limits  bilaterally.  Neurologic  Senn-Weinstein monofilament wire test within normal limits  bilaterally. Muscle power within normal limits bilaterally.  Nails Thick disfigured discolored nails with subungual debris  from hallux to fifth toes bilaterally. No evidence of bacterial infection or drainage bilaterally.  Orthopedic  No limitations of motion  feet .  No crepitus or effusions noted.  Hammer toes  B/L.Marland Kitchen  Skin  normotropic skin with no porokeratosis noted bilaterally.  No signs of infections or ulcers noted.     Onychomycosis  Pain in right toes  Pain in left toes  Consent was obtained for treatment procedures.   Mechanical debridement of nails 1-5  bilaterally performed with a nail nipper.  Filed with dremel without incident.    Return office visit   3 months                   Told patient to return for periodic foot care and evaluation due to potential at risk complications.   Gardiner Barefoot DPM

## 2023-01-24 NOTE — Addendum Note (Signed)
Addended by: Gardiner Barefoot on: 01/24/2023 04:33 PM   Modules accepted: Level of Service

## 2023-01-30 NOTE — Telephone Encounter (Signed)
Created in error

## 2023-02-01 ENCOUNTER — Telehealth: Payer: Self-pay | Admitting: Cardiovascular Disease

## 2023-02-01 ENCOUNTER — Other Ambulatory Visit: Payer: Self-pay | Admitting: Cardiovascular Disease

## 2023-02-01 MED ORDER — METOPROLOL TARTRATE 25 MG PO TABS: 12.5000 mg | ORAL_TABLET | Freq: Two times a day (BID) | ORAL | 0 refills | Status: DC

## 2023-02-01 NOTE — Telephone Encounter (Signed)
Pt c/o medication issue:  1. Name of Medication:   metoprolol tartrate (LOPRESSOR) 25 MG tablet    2. How are you currently taking this medication (dosage and times per day)? Take 0.5 tablets (12.5 mg total) by mouth 2 (two) times daily. TAKE 1 TABLET(25 MG) BY MOUTH TWICE DAILY   3. Are you having a reaction (difficulty breathing--STAT)? No  4. What is your medication issue? Pharmacy needs clarification on instructions.

## 2023-02-01 NOTE — Telephone Encounter (Signed)
Requested Prescriptions   Signed Prescriptions Disp Refills   metoprolol tartrate (LOPRESSOR) 25 MG tablet 90 tablet 0    Sig: Take 0.5 tablets (12.5 mg total) by mouth 2 (two) times daily.    Authorizing Provider: [name]    Ordering User: Raelene Bott, Layton Naves L

## 2023-02-02 ENCOUNTER — Telehealth: Payer: Self-pay

## 2023-02-02 NOTE — Telephone Encounter (Signed)
Patient called to inquire about drinking beet juice. Patient states he has a powder that you mix with water and it states to consult with your physician prior to drinking.  Patient said response can be relayed to his daughter-in-law, Rodena Piety who works at our office.   Please advise

## 2023-02-03 NOTE — Telephone Encounter (Signed)
Msg given to anita okay to use beet juice but to be mindful of sugar content and Eric [name] turn stool red

## 2023-02-20 ENCOUNTER — Other Ambulatory Visit: Payer: Self-pay | Admitting: Nurse Practitioner

## 2023-02-20 DIAGNOSIS — E039 Hypothyroidism, unspecified: Secondary | ICD-10-CM

## 2023-03-08 ENCOUNTER — Telehealth: Payer: Self-pay

## 2023-03-08 NOTE — Telephone Encounter (Signed)
Form received from [facility] oxygen. Unable to complete form due to lack of information. Upon looking further into chart, our office is not the ordering provider. I faxed form to [facility] where patient received referral.

## 2023-03-27 DIAGNOSIS — E113393 Type 2 diabetes mellitus with moderate nonproliferative diabetic retinopathy without macular edema, bilateral: Secondary | ICD-10-CM | POA: Diagnosis not present

## 2023-03-28 ENCOUNTER — Encounter: Payer: PPO | Admitting: Nurse Practitioner

## 2023-03-29 ENCOUNTER — Other Ambulatory Visit: Payer: Self-pay

## 2023-03-29 DIAGNOSIS — R7989 Other specified abnormal findings of blood chemistry: Secondary | ICD-10-CM

## 2023-03-29 DIAGNOSIS — E782 Mixed hyperlipidemia: Secondary | ICD-10-CM

## 2023-03-29 MED ORDER — PRAVASTATIN SODIUM 40 MG PO TABS: ORAL_TABLET | ORAL | 1 refills | Status: DC

## 2023-03-31 ENCOUNTER — Ambulatory Visit (INDEPENDENT_AMBULATORY_CARE_PROVIDER_SITE_OTHER): Payer: PPO | Admitting: Nurse Practitioner

## 2023-03-31 ENCOUNTER — Encounter: Payer: Self-pay | Admitting: Nurse Practitioner

## 2023-03-31 ENCOUNTER — Telehealth: Payer: Self-pay

## 2023-03-31 DIAGNOSIS — Z0000 Encounter for general adult medical examination without abnormal findings: Secondary | ICD-10-CM | POA: Diagnosis not present

## 2023-03-31 DIAGNOSIS — Z66 Do not resuscitate: Secondary | ICD-10-CM

## 2023-03-31 NOTE — Progress Notes (Signed)
Subjective:   Eric [name] is a 89 y.o. male who presents for Medicare Annual/Subsequent preventive examination.  Review of Systems     Cardiac Risk Factors include: advanced age (>39men, >66 women);hypertension;dyslipidemia;male gender;sedentary lifestyle;obesity (BMI >30kg/m2)     Objective:    There were no vitals filed for this visit. There is no height or weight on file to calculate BMI.     03/31/2023    9:38 AM 12/26/2022   10:45 AM 09/23/2022    8:09 AM 03/24/2022    9:55 AM 03/11/2022   10:29 AM 08/13/2021   10:22 AM 03/18/2021    9:54 AM  Advanced Directives  Does Patient Have a Medical Advance Directive? Yes Yes No Yes Yes No Yes  Type of Estate agent of [employer];Living will Healthcare Power of [place];Living will;Out of facility DNR (pink MOST or yellow form)  Healthcare Power of [place];Living will Healthcare Power of [place];Living will  Healthcare Power of [place];Living will;Out of facility DNR (pink MOST or yellow form)  Does patient want to make changes to medical advance directive? No - Patient declined No - Patient declined  No - Patient declined No - Patient declined  No - Patient declined  Copy of Healthcare Power of Attorney in Chart? No - copy requested No - copy requested  No - copy requested No - copy requested  No - copy requested  Would patient like information on creating a medical advance directive?   No - Patient declined   No - Patient declined     Current Medications (verified) Outpatient Encounter Medications as of 03/31/2023  Medication Sig   albuterol (PROVENTIL) (2.5 MG/3ML) 0.083% nebulizer solution Take 3 mLs (2.5 mg total) by nebulization every 6 (six) hours as needed for wheezing or shortness of breath.   cetirizine (ZYRTEC) 10 MG tablet TAKE 1 TABLET(10 MG) BY MOUTH DAILY   Cholecalciferol (VITAMIN D) 1000 UNITS capsule Take one tablet once daily   ELIQUIS 5 MG TABS tablet TAKE 1 TABLET BY MOUTH TWICE DAILY    fluticasone (FLONASE) 50 MCG/ACT nasal spray Place 1 spray into both nostrils daily as needed for allergies or rhinitis.   fluticasone-salmeterol (ADVAIR DISKUS) 250-50 MCG/ACT AEPB Inhale 1 puff into the lungs in the morning and at bedtime.   furosemide (LASIX) 40 MG tablet Take 1 tablet (40 mg total) by mouth daily.   gabapentin (NEURONTIN) 300 MG capsule TAKE 1 CAPSULE(300 MG) BY MOUTH TWICE DAILY   guaiFENesin (MUCINEX) 600 MG 12 hr tablet Take 2 tablets (1,200 mg total) by mouth 2 (two) times daily as needed for cough or to loosen phlegm.   levothyroxine (SYNTHROID) 25 MCG tablet TAKE 1 TABLET BY MOUTH EVERY DAY BEFORE BREAKFAST AS DIRECTED   lidocaine (LIDODERM) 5 % Place 1 patch onto the skin daily. Remove & Discard patch within 12 hours or as directed by MD   metoprolol tartrate (LOPRESSOR) 25 MG tablet Take 0.5 tablets (12.5 mg total) by mouth 2 (two) times daily.   OXYGEN Inhale 4 L into the lungs at bedtime.    pantoprazole (PROTONIX) 40 MG tablet TAKE 1 TABLET(40 MG) BY MOUTH AT BEDTIME   potassium chloride (KLOR-CON M) 10 MEQ tablet Take 1 tablet (10 mEq total) by mouth daily.   pravastatin (PRAVACHOL) 40 MG tablet TAKE 1 TABLET BY MOUTH EVERY DAY   No facility-administered encounter medications on file as of 03/31/2023.    Allergies (verified) Morphine and related, Percocet [oxycodone-acetaminophen], and Valium   History: Past  Medical History:  Diagnosis Date   Anxiety    Arthritis    Atypical chest pain    a. 05/2017 MV: no ischemia, EF 79%.   Chest pain 10/20/2017   Chronic diastolic CHF (congestive heart failure)    a. 01/2011 Echo: EF 50-55%, gr1 DD, mild AI, nl RV fxn, mild TR/PR; b. 10/2017 Echo: EF 60-65%, mild LVH, gr2 DD.   DDD (degenerative disc disease), cervical    Depressive disorder, not elsewhere classified    Difficult intubation    Dysphagia, oral phase    Dyspnea    Edema    Gallstones    a. Symptomatic - s/p lap chole 05/2018.   History of DVT (deep  vein thrombosis)    History of kidney stones    Hypertension    Hypoxemia    Impacted cerumen    Ischemic colitis    a. 02/2018 GIB - colonoscopy w/ isch colitis. Anticoagulation resumed.   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 a. 01/2011 in setting of post-op complications including aspiration pna;  b. CHA2DS2VASc = 4--> Amio/Eliquis.   Pneumonia 03/06/2003   Spinal stenosis, unspecified region other than cervical    Squamous cell carcinoma of skin of trunk, except scrotum    skin cancer of shoulder   Syncope    a. 10/2017-->Event monitor: RSR, rare PACs/PVCs.   Thoracic or lumbosacral neuritis or radiculitis, unspecified    Past Surgical History:  Procedure Laterality Date   CARDIOVERSION N/A 06/20/2017   Procedure: CARDIOVERSION;  Surgeon: Antonieta Iba, MD;  Location: [facility] ORS;  Service: Cardiovascular;  Laterality: N/A;   CATARACT EXTRACTION W/ INTRAOCULAR LENS  IMPLANT, BILATERAL     CERVICAL FUSION  02/10/2011   CHOLECYSTECTOMY  05/24/2018   CHOLECYSTECTOMY N/A 05/24/2018   Procedure: LAPAROSCOPIC CHOLECYSTECTOMY;  Surgeon: Abigail Miyamoto, MD;  Location: [facility] OR;  Service: General;  Laterality: N/A;   COLONOSCOPY WITH PROPOFOL N/A 02/20/2018   Procedure: COLONOSCOPY WITH PROPOFOL;  Surgeon: Midge Minium, MD;  Location: [facility] ENDOSCOPY;  Service: Endoscopy;  Laterality: N/A;   history of abd ultrasound  11/01   fatty liver   MULTIPLE TOOTH EXTRACTIONS     ORIF FIBULA FRACTURE Left 01/06/2017   Procedure: OPEN REDUCTION INTERNAL FIXATION (ORIF) FIBULA FRACTURE DISTAL FIBULA;  Surgeon: Marcene Corning, MD;  Location: [facility] OR;  Service: Orthopedics;  Laterality: Left;  Patient states has problems if he  will have a tube in throat for Genera; Anesthesia   Family History  Problem Relation Age of Onset   Stroke Father    Atrial fibrillation Brother        on coumadin   Heart disease Brother        AFib- coumadin   Stroke Paternal Grandfather    Other Mother        hemorrhage   Cancer Other        colon cancer at early age   Prostate cancer Neg Hx    Kidney cancer Neg Hx    Bladder Cancer Neg Hx    Social History   Socioeconomic History   Marital status: Married    Spouse name: Not  on file   Number of children: 2   Years of education: 12   Highest education level: High school graduate  Occupational History   Occupation: Retired 2006    Employer: RETIRED    Comment: Teacher, 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   Left-handed (due to arthritis, he uses his right hand more now).   Caffeine use: 2 cups per day.   Social Determinants of Health   Financial Resource Strain: Low Risk  (03/09/2018)   Overall Financial Resource Strain (CARDIA)    Difficulty of Paying Living Expenses: Not hard at all  Food Insecurity: No Food Insecurity (03/09/2018)   Hunger Vital Sign    Worried About Running Out of Food in the Last Year: Never true    Ran Out of Food in the Last Year: Never true  Transportation Needs: No Transportation Needs (03/09/2018)   PRAPARE - Administrator, Civil Service (Medical): No    Lack of Transportation (Non-Medical): No  Physical Activity: Insufficiently Active (03/09/2018)   Exercise Vital Sign    Days of Exercise per Week: 7 days    Minutes of Exercise per Session: 10 min  Stress: No  Stress Concern Present (03/09/2018)   [employer] of Occupational Health - Occupational Stress Questionnaire    Feeling of Stress : Not at all  Social Connections: Moderately Integrated (03/09/2018)   Social Connection and Isolation Panel [NHANES]    Frequency of Communication with Friends and Family: More than three times a week    Frequency of Social Gatherings with Friends and Family: More than three times a week    Attends Religious Services: More than 4 times per year    Active Member of [employer] or Organizations: No    Attends Banker Meetings: Never    Marital Status: Married    Tobacco Counseling Counseling given: Not Answered Tobacco comments: stopped in 20's   Clinical Intake:  Pre-visit preparation completed: Yes  Pain : No/denies pain     BMI - recorded: 30.12 Nutritional Status: BMI > 30  Obese  How often do you need to have someone help you when you read instructions, pamphlets, or other written materials from your doctor or pharmacy?: 1 - Never  Diabetic?no         Activities of Daily Living    03/31/2023    9:57 AM  In your present state of health, do you have any difficulty performing the following activities:  Hearing? 0  Vision? 0  Difficulty concentrating or making decisions? 0  Walking or climbing stairs? 1  Dressing or bathing? 0  Doing errands, shopping? 1  Comment needs help with shopping  Preparing Food and eating ? N  Using the Toilet? N  In the past six months, have you accidently leaked urine? N  Do you have problems with loss of bowel control? N  Managing your Medications? N  Managing your Finances? N  Housekeeping or managing your Housekeeping? N    Patient Care Team: Janyth Contes,  Janene Harvey, NP as PCP - General (Geriatric Medicine) Antonieta Iba, MD as PCP - Cardiology (Cardiology) Antonieta Iba, MD as Consulting Physician (Cardiology) Claria Dice, MD as Attending Physician (Physical Medicine and  Rehabilitation) Estill Bamberg, MD as Consulting Physician (Orthopedic Surgery) Merwyn Katos, MD (Inactive) as Consulting Physician (Pulmonary Disease)  Indicate any recent Medical Services you [place] have received from other than [facility] providers in the past year (date K[place] be approximate).     Assessment:   This is a routine wellness examination for [place].  Hearing/Vision screen No results found.  Dietary issues and exercise activities discussed: Current Exercise Habits: The patient does not participate in regular exercise at present   Goals Addressed   None    Depression Screen    03/31/2023    9:38 AM 03/24/2022    9:54 AM 03/11/2022   10:29 AM 03/18/2021    9:52 AM 12/31/2020   10:49 AM 12/28/2020   10:28 AM 07/10/2020   10:41 AM  PHQ 2/9 Scores  PHQ - 2 Score 0 0 0 0 0 0 0    Fall Risk    03/31/2023    9:38 AM 12/26/2022   10:45 AM 09/23/2022    8:10 AM 03/24/2022    9:55 AM 03/11/2022   10:28 AM  Fall Risk   Falls in the past year? 0 0 0 0 0  Number falls in past yr: 0 0 0 0 0  Injury with Fall? 0 0 0 0 0  Risk for fall due to : No Fall Risks Impaired mobility No Fall Risks No Fall Risks No Fall Risks  Follow up Falls evaluation completed Falls evaluation completed Falls evaluation completed Falls evaluation completed Falls evaluation completed    FALL RISK PREVENTION PERTAINING TO THE HOME:  Any stairs in or around the home? No  If so, are there any without handrails? No  Home free of loose throw rugs in walkways, pet beds, electrical cords, etc? Yes  Adequate lighting in your home to reduce risk of falls? Yes   ASSISTIVE DEVICES UTILIZED TO PREVENT FALLS:  Life alert? No  Use of a cane, walker or w/c? Yes  Grab bars in the bathroom? No  Shower chair or bench in shower? Yes  Elevated toilet seat or a handicapped toilet? Yes   TIMED UP AND GO:  Was the test performed? No .    Cognitive Function:    03/09/2018   10:26 AM 05/16/2016    9:00 AM  MMSE - Mini  Mental State Exam  Orientation to time 5 5  Orientation to Place 5 5  Registration 3 3  Attention/ Calculation 5 5  Recall 2 3  Language- name 2 objects 2 2  Language- repeat 1 1  Language- follow 3 step command 3 3  Language- read & follow direction 1 1  Write a sentence 1 1  Copy design 0 1  Total score 28 30        03/31/2023    9:39 AM 03/24/2022    9:56 AM 03/18/2021    9:55 AM 03/16/2020   10:00 AM 03/15/2019   11:09 AM  6CIT Screen  What Year? 0 points 0 points 0 points 0 points 0 points  What month? 0 points 0 points 0 points 0 points 0 points  What time? 0 points 0 points 0 points 0 points 0 points  Count back from 20 0 points 0 points 0 points 0 points 2 points  Months in reverse 0 points 0 points 0 points 0 points 0 points  Repeat phrase 8 points 2 points 8 points 6 points 4 points  Total Score 8 points 2 points 8 points 6 points 6 points    Immunizations Immunization History  Administered Date(s) Administered   Fluad Quad(high Dose 65+) 10/11/2019, 11/02/2020, 08/13/2021, 09/23/2022   Influenza Whole 09/11/1997, 10/11/2007, 10/06/2010   Influenza, High Dose Seasonal PF 09/01/2017, 08/30/2018   Influenza,inj,Quad PF,6+ Mos 10/03/2013, 09/15/2014, 09/04/2015   PFIZER(Purple Top)SARS-COV-2 Vaccination 01/18/2020, 02/08/2020   Pneumococcal Conjugate-13 01/19/2015   Pneumococcal Polysaccharide-23 03/05/2002   Td 01/19/2003   Tdap 10/14/2013   Zoster, Live 10/14/2013    TDAP status: Up to date  Flu Vaccine status: Up to date  Pneumococcal vaccine status: Up to date  Covid-19 vaccine status: Information provided on how to obtain vaccines.   Qualifies for Shingles Vaccine? Yes   Zostavax completed No   Shingrix Completed?: No.    Education has been provided regarding the importance of this vaccine. Patient has been advised to call insurance company to determine out of pocket expense if they have not yet received this vaccine. Advised [place] also receive vaccine at  local pharmacy or Health Dept. Verbalized acceptance and understanding.  Screening Tests Health Maintenance  Topic Date Due   Zoster Vaccines- Shingrix (1 of 2) Never done   COVID-19 Vaccine (3 - 2023-24 season) 08/12/2022   INFLUENZA VACCINE  07/13/2023   DTaP/Tdap/Td (3 - Td or Tdap) 10/15/2023   Medicare Annual Wellness (AWV)  03/30/2024   Pneumonia Vaccine 74+ Years old  Completed   HPV VACCINES  Aged Out   COLONOSCOPY (Pts 45-82yrs Insurance coverage will need to be confirmed)  Discontinued    Health Maintenance  Health Maintenance Due  Topic Date Due   Zoster Vaccines- Shingrix (1 of 2) Never done   COVID-19 Vaccine (3 - 2023-24 season) 08/12/2022    Colorectal cancer screening: No longer required.   Lung Cancer Screening: (Low Dose CT Chest recommended if Age 16-80 years, 30 pack-year currently smoking OR have quit w/in 15years.) does not qualify.   Lung Cancer Screening Referral: na  Additional Screening:  Hepatitis C Screening: does not qualify; Completed na  Vision Screening: Recommended annual ophthalmology exams for early detection of glaucoma and other disorders of the eye. Is the patient up to date with their annual eye exam?  Yes  Who is the provider or what is the name of the office in which the patient attends annual eye exams? [facility] If pt is not established with a provider, would they like to be referred to a provider to establish care? No .   Dental Screening: Recommended annual dental exams for proper oral hygiene  Community Resource Referral / Chronic Care Management: CRR required this visit?  No   CCM required this visit?  No      Plan:     I have personally reviewed and noted the following in the patient's chart:   Medical and social history Use of alcohol, tobacco or illicit drugs  Current medications and supplements including opioid prescriptions. Patient is not currently taking opioid prescriptions. Functional ability and  status Nutritional status Physical activity Advanced directives List of other physicians Hospitalizations, surgeries, and ER visits in previous 12 months Vitals Screenings to include cognitive, depression, and falls Referrals and appointments  In addition, I have reviewed and discussed with patient certain preventive protocols, quality metrics, and best practice recommendations. A written personalized care plan for  preventive services as well as general preventive health recommendations were provided to patient.     Sharon Seller, NP   03/31/2023   Virtual Visit via Video Note  I connected with Baird Kay Ulloa on 03/31/23 at 10:00 AM EDT by a video enabled telemedicine application and verified that I am speaking with the correct person using two identifiers.  Location: Patient: home Provider: [facility]   I discussed the limitations of evaluation and management by telemedicine and the availability of in person appointments. The patient expressed understanding and agreed to proceed.    I discussed the assessment and treatment plan with the patient. The patient was provided an opportunity to ask questions and all were answered. The patient agreed with the plan and demonstrated an understanding of the instructions.   The patient was advised to call back or seek an in-person evaluation if the symptoms worsen or if the condition fails to improve as anticipated.  I provided 15 minutes of non-face-to-face time during this encounter.  Janene Harvey. Janyth Contes, AGNP Avs printed and mailed.

## 2023-03-31 NOTE — Progress Notes (Signed)
   This service is provided via telemedicine  No vital signs collected/recorded due to the encounter was a telemedicine visit.   Location of patient (ex: home, work):  Home  Patient consents to a telephone visit: Yes, see telephone visit dated 03/31/23  Location of the provider (ex: office, home):  [facility], Office   Name of any referring provider:  N/A  Names of all persons participating in the telemedicine service and their role in the encounter:  S.Chrae B/CMA, Abbey Chatters, NP, Lelon Mast Frie (granddaughter), and Patient   Time spent on call:  11 min with medical assistant

## 2023-03-31 NOTE — Telephone Encounter (Signed)
Mr. breylan, Eric [name] are scheduled for a virtual visit with your provider today.    Just as we do with appointments in the office, we must obtain your consent to participate.  Your consent will be active for this visit and any virtual visit you [place] have with one of our providers in the next 365 days.    If you have a MyChart account, I can also send a copy of this consent to you electronically.  All virtual visits are billed to your insurance company just like a traditional visit in the office.  As this is a virtual visit, video technology does not allow for your provider to perform a traditional examination.  This [place] limit your provider's ability to fully assess your condition.  If your provider identifies any concerns that need to be evaluated in person or the need to arrange testing such as labs, EKG, etc, we will make arrangements to do so.    Although advances in technology are sophisticated, we cannot ensure that it will always work on either your end or our end.  If the connection with a video visit is poor, we [place] have to switch to a telephone visit.  With either a video or telephone visit, we are not always able to ensure that we have a secure connection.   I need to obtain your verbal consent now.   Are you willing to proceed with your visit today?   Eric [name] has provided verbal consent on 03/31/2023 for a virtual visit (video or telephone).   [name] [place], [place] 03/31/2023  10:05 AM

## 2023-04-12 ENCOUNTER — Telehealth: Payer: Self-pay | Admitting: Pulmonary Disease

## 2023-04-12 ENCOUNTER — Encounter: Payer: Self-pay | Admitting: Pulmonary Disease

## 2023-04-12 ENCOUNTER — Ambulatory Visit (INDEPENDENT_AMBULATORY_CARE_PROVIDER_SITE_OTHER): Payer: PPO | Admitting: Pulmonary Disease

## 2023-04-12 VITALS — BP 112/70 | HR 58 | Temp 97.3°F | Ht 66.0 in | Wt 188.6 lb

## 2023-04-12 DIAGNOSIS — J449 Chronic obstructive pulmonary disease, unspecified: Secondary | ICD-10-CM | POA: Diagnosis not present

## 2023-04-12 DIAGNOSIS — J9611 Chronic respiratory failure with hypoxia: Secondary | ICD-10-CM

## 2023-04-12 DIAGNOSIS — J441 Chronic obstructive pulmonary disease with (acute) exacerbation: Secondary | ICD-10-CM

## 2023-04-12 MED ORDER — CEFUROXIME AXETIL 500 MG PO TABS: 500.0000 mg | ORAL_TABLET | Freq: Two times a day (BID) | ORAL | 0 refills | Status: DC

## 2023-04-12 NOTE — Patient Instructions (Signed)
Cefuroxime antibiotic one pill twice daily for 7 days  Will have [employer] service your home oxygen set up  Get a new pulse oximeter to monitor your oxygen level during the day  Call the office and let us know which inhalers you are using  Follow up in 3 months

## 2023-04-12 NOTE — Progress Notes (Signed)
Eric [name], Critical Care, and Sleep Medicine  Chief Complaint  Patient presents with   Follow-up    SOB with exertion. Wheezing. Dry cough.    Constitutional:  BP 112/70 (BP Location: Right Arm, Cuff Size: Normal)   Pulse (!) 58   Temp (!) 97.3 F (36.3 C)   Ht 5\' 6"  (1.676 m)   Wt 188 lb 9.6 oz (85.5 kg)   SpO2 91%   BMI 30.44 kg/m   Past Medical History:  Anxiety, OA, Chronic diastolic CHF, Depression, Dysphagia, DVT, Nephrolithiasis, HTN, Ischemic colitis, Back pain, HLD, PAF, PNA 2004, Spinal stenosis, COVID 30 December 2020  Past Surgical History:  He  has a past surgical history that includes history of abd ultrasound (11/01); Cervical fusion (02/10/2011); Cataract extraction w/ intraocular lens  implant, bilateral; Multiple tooth extractions; ORIF fibula fracture (Left, 01/06/2017); CARDIOVERSION (N/A, 06/20/2017); Colonoscopy with propofol (N/A, 02/20/2018); Cholecystectomy (05/24/2018); and Cholecystectomy (N/A, 05/24/2018).  Brief Summary:  Eric [name] is a 89 y.o. 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      Subjective:   He is here with his daughter.  He wasn't able to afford trelegy.  He says he is using a powder inhaler twice a day, but doesn't remember which one it is.  He uses albuterol daily.  He has cough and chest congestion, but has trouble bringing up phlegm.  Has occasional wheeze.  Mild sinus congestion from pollen.  Uses oxygen at night.  Doesn't have a pulse oximeter anymore.  Has mild leg swelling, but no worse than usual.  Physical Exam:   Appearance - uses a walker  ENMT - no sinus tenderness, no oral exudate, no LAN, Mallampati 3 airway, no stridor  Respiratory - scattered rhonchi that clear with cough, no wheeze  CV - s1s2 regular rate and rhythm, no murmurs  Ext - 1+ lower leg edema  Skin - no rashes  Psych - normal mood and affect   [name] testing:  PFT 12/25/18 >> FEV1 1.10  (45%), FEV1% 64, TLC 3.81 (60%)  Chest Imaging:  CT angio chest 11/02/20 >> b/l GGO in posterior segments of upper lobes and lower lobes HRCT CT chest 08/23/21 >> ascending aorta 4 cm, atherosclerosis, , PA 3.6 cm, calcified Lt hilar LN, diffuse bronchial thickening, scattered plugs, band like scarring and dependent GGO  Sleep Tests:  ONO with 2 liters 05/22/20 >> test time 8 hrs 32 min.  Baseline SpO2 91%, SpO2 low 64%.  Spent 57 min 8 sec with SpO2 < 88%.  SpO2 graph suggestive of REM related desaturation pattern that could be seen in sleep apnea.  Cardiac Tests:  Echo 10/20/17 >> EF 60 to 65%, mild LVH, grade 2 DD  Social History:  He  reports that he has quit smoking. His smoking use included cigarettes. He has never used smokeless tobacco. He reports that he does not drink alcohol and does not use drugs.  Family History:  His family history includes Atrial fibrillation in his brother; Cancer in an other family member; Heart disease in his brother; Other in his mother; Stroke in his father and paternal grandfather.     Assessment/Plan:   COPD with chronic bronchitis. - he has an exacerbation - will give him course of ceftin; don't think he needs prednisone at this time - he has advair listed as his inhaler; he will call to confirm this is what he is using - other inhalers were too expensive based on  his insurance plan - prn albuterol, mucinex - he has a nebulizer  Allergic rhinitis. - prn flonase  Dysphagia with chronic aspiration. - bronchial hygiene - discussed symptoms to monitor for that would indicate he has a respiratory infection and would need antibiotics   Chronic respiratory failure with hypoxia. - he uses 4 liters oxygen at night - will have his DME service his home oxygen equipment and arrange for replacement supplies    Time Spent Involved in Patient Care on Day of Examination:  38 minutes  Follow up:   Patient Instructions  Cefuroxime antibiotic one  pill twice daily for 7 days  Will have [employer] service your home oxygen set up  Get a new pulse oximeter to monitor your oxygen level during the day  Call the office and let us know which inhalers you are using  Follow up in 3 months  Medication List:   Allergies as of 04/12/2023       Reactions   Morphine And Related Shortness Of Breath   Percocet [oxycodone-acetaminophen] Shortness Of Breath   Valium Shortness Of Breath        Medication List        Accurate as of Eric [name] 1, 2024 10:42 AM. If you have any questions, ask your nurse or doctor.          albuterol (2.5 MG/3ML) 0.083% nebulizer solution Commonly known as: PROVENTIL Take 3 mLs (2.5 mg total) by nebulization every 6 (six) hours as needed for wheezing or shortness of breath.   cefUROXime 500 MG tablet Commonly known as: CEFTIN Take 1 tablet (500 mg total) by mouth 2 (two) times daily with a meal. Started by: Coralyn Helling, MD   cetirizine 10 MG tablet Commonly known as: ZYRTEC TAKE 1 TABLET(10 MG) BY MOUTH DAILY   Eliquis 5 MG Tabs tablet Generic drug: apixaban TAKE 1 TABLET BY MOUTH TWICE DAILY   fluticasone 50 MCG/ACT nasal spray Commonly known as: FLONASE Place 1 spray into both nostrils daily as needed for allergies or rhinitis.   fluticasone-salmeterol 250-50 MCG/ACT Aepb Commonly known as: Advair Diskus Inhale 1 puff into the lungs in the morning and at bedtime.   furosemide 40 MG tablet Commonly known as: LASIX Take 1 tablet (40 mg total) by mouth daily.   gabapentin 300 MG capsule Commonly known as: NEURONTIN TAKE 1 CAPSULE(300 MG) BY MOUTH TWICE DAILY   guaiFENesin 600 MG 12 hr tablet Commonly known as: Mucinex Take 2 tablets (1,200 mg total) by mouth 2 (two) times daily as needed for cough or to loosen phlegm.   levothyroxine 25 MCG tablet Commonly known as: SYNTHROID TAKE 1 TABLET BY MOUTH EVERY DAY BEFORE BREAKFAST AS DIRECTED   lidocaine 5 % Commonly known as: Lidoderm Place 1  patch onto the skin daily. Remove & Discard patch within 12 hours or as directed by MD   metoprolol tartrate 25 MG tablet Commonly known as: LOPRESSOR Take 0.5 tablets (12.5 mg total) by mouth 2 (two) times daily.   OXYGEN Inhale 4 L into the lungs at bedtime.   pantoprazole 40 MG tablet Commonly known as: PROTONIX TAKE 1 TABLET(40 MG) BY MOUTH AT BEDTIME   potassium chloride 10 MEQ tablet Commonly known as: KLOR-CON M Take 1 tablet (10 mEq total) by mouth daily.   pravastatin 40 MG tablet Commonly known as: PRAVACHOL TAKE 1 TABLET BY MOUTH EVERY DAY   Vitamin D 1000 units capsule Take one tablet once daily  Signature:  Coralyn Helling, MD [name]/Critical Care Pager - [phone] 04/12/2023, 10:42 AM

## 2023-04-12 NOTE — Telephone Encounter (Signed)
Pt's daughter Arline Asp called the office to let Dr. Craige Cotta know which inhaler pt is taking.  Per Arline Asp, pt is on fluticasone-salmeterol and I let Arline Asp know that that is the generic for Advair Diskus.  If this inhaler is okay for pt to still be on, pt does need refills sent to [facility] in [place] off [address].  Routing info to Dr. Craige Cotta.

## 2023-04-12 NOTE — Telephone Encounter (Signed)
He should continue advair one puff bid.  Okay to send refill.

## 2023-04-13 MED ORDER — FLUTICASONE-SALMETEROL 250-50 MCG/ACT IN AEPB: 1.0000 | INHALATION_SPRAY | Freq: Two times a day (BID) | RESPIRATORY_TRACT | 5 refills | Status: DC

## 2023-04-13 NOTE — Telephone Encounter (Signed)
Rx for Advair has been sent to the pharmacy for pt. Called and spoke with pt's daughter letting her know this has been refilled and she verbalized understanding. Nothing further needed.

## 2023-04-21 ENCOUNTER — Other Ambulatory Visit: Payer: Self-pay | Admitting: Nurse Practitioner

## 2023-04-28 ENCOUNTER — Ambulatory Visit: Payer: PPO | Admitting: Podiatry

## 2023-05-16 ENCOUNTER — Other Ambulatory Visit: Payer: Self-pay | Admitting: Nurse Practitioner

## 2023-05-16 MED ORDER — METOPROLOL TARTRATE 25 MG PO TABS: 12.5000 mg | ORAL_TABLET | Freq: Two times a day (BID) | ORAL | 1 refills | Status: DC

## 2023-05-22 NOTE — Progress Notes (Unsigned)
Date:  05/23/2023   ID:  Eric [name], DOB Eric [name] 19, 1933, MRN [id number]  Patient Location:  [address] [place] [id number]   Provider location:   [facility], [place] office  PCP:  Sharon Seller, NP  Cardiologist:  Hubbard Robinson [facility]  Chief Complaint  Patient presents with   6 month follow up     Patient c/o shortness of breath & legs give out, has fallen 3 times. Medications reviewed by the patient verbally.     History of Present Illness:    Eric [name] is a 89 y.o. male past medical history of atrial fibrillation, frequent PVCs,  maintaining sinus rhythm, severe neck disease and stenosis who underwent  cervical and thoracic laminectomy and fusion with instrumentation at the beginning of March 2012 with postoperative complications including aspiration requiring a feeding tube  Remote smoking in the [employer] Chronic SOB, deconditioned Cardioversion 06/2017 Recurrance of atrial fibrillation 08/24/2021 off amiodarone (for thyroid d/o), medical management who presents for routine followup of his atrial fibrillation,  paroxysmal/persistent  Last seen by myself in clinic December 2023 Legs weak, recent falls, Legs go to sleep Using walker, chronic debility Lives with wife, she has back problems  [place]arey Seat outside, in the [place], and in the bathroom "Legs went out from under"  Grand-daughter helps with groceries and helps with ADLs She drive to medical appts visits  Reports inhalers were changed Remains on Eliquis and metoprolol tartrate 25 twice daily  Previously felt asymptomatic from his atrial fibrillation and had made decision for rate control rather than rhythm control  Labs reviewed Total chol 110, LDL 54, CR 1.03, potassium 4.5,  on Lasix 40 daily,  TSH 1.57  EKG personally reviewed by myself on todays visit Atrial fibrillation with ventricular rate 59 bpm, left axis deviation  Other past medical history reviewed CT scan chest There is diffuse  bronchial wall thickening in the bilateral lower lobes, as well as scattered bronchiolar plugging, bandlike scarring and dependent ground-glass. Findings are consistent with sequelae of infection or aspiration, which Kalan be ongoing. 2. Cardiomegaly and coronary artery disease. 3. Enlargement of the tubular ascending thoracic aorta, measuring up to 4.0 x 4.0 cm. 4. Enlargement of the main pulmonary artery measuring up to 3.6 cm in caliber, as can be seen in pulmonary hypertension.  Lab work reviewed Total chol 125, LDL 69   Other past medical history reviewed  hospital March 18-21 , 2020 Admitted for sepsis and acute on chronic hypoxic respiratory failure secondary to bronchitis and rhinovirus infection. Initially presented with fever, chills, cough and shortness of breath Sent home with prednisone   Event Monitor Avg HR of 58 bpm. Predominant underlying rhythm was Sinus Rhythm.  Second Degree AV Block-Mobitz I (Wenckebach) was present.  Isolated SVEs were rare (<1.0%), and no SVE Couplets or SVE Triplets were present. Isolated VEs were rare (<1.0%), and no VE Couplets or VE Triplets were present. Patient triggered events were not associated with significant arrhythmia.   Echocardiogram 04/04/2017 with ejection fraction 55-60%, mildly dilated left atrium    Past Medical History:  Diagnosis Date   Anxiety    Arthritis    Atypical chest pain    a. 05/2017 MV: no ischemia, EF 79%.   Chest pain 10/20/2017   Chronic diastolic CHF (congestive heart failure) (HCC)    a. 01/2011 Echo: EF 50-55%, gr1 DD, mild AI, nl RV fxn, mild TR/PR; b. 10/2017 Echo: EF 60-65%, mild LVH, gr2 DD.   DDD (  degenerative disc disease), cervical    Depressive disorder, not elsewhere classified    Difficult intubation    Dysphagia, oral phase    Dyspnea    Edema    Gallstones    a. Symptomatic - s/p lap chole 05/2018.   History of DVT (deep vein thrombosis)    History of kidney stones    Hypertension     Hypoxemia    Impacted cerumen    Ischemic colitis (HCC)    a. 02/2018 GIB - colonoscopy w/ isch colitis. Anticoagulation resumed.   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 a. 01/2011 in setting of post-op complications including aspiration pna;  b. CHA2DS2VASc = 4--> Amio/Eliquis.   Pneumonia 03/06/2003   Spinal stenosis, unspecified region other than cervical    Squamous cell carcinoma of skin of trunk, except scrotum    skin cancer of shoulder   Syncope    a. 10/2017-->Event monitor: RSR, rare PACs/PVCs.   Thoracic or lumbosacral neuritis or radiculitis, unspecified    Past Surgical History:  Procedure Laterality Date   CARDIOVERSION N/A 06/20/2017   Procedure: CARDIOVERSION;  Surgeon: Antonieta Iba, MD;  Location: [facility] ORS;  Service: Cardiovascular;  Laterality: N/A;   CATARACT EXTRACTION W/ INTRAOCULAR LENS  IMPLANT, BILATERAL     CERVICAL FUSION  02/10/2011   CHOLECYSTECTOMY  05/24/2018   CHOLECYSTECTOMY N/A 05/24/2018   Procedure: LAPAROSCOPIC CHOLECYSTECTOMY;  Surgeon: Abigail Miyamoto, MD;  Location: [facility] OR;  Service: General;  Laterality: N/A;   COLONOSCOPY WITH PROPOFOL N/A 02/20/2018   Procedure: COLONOSCOPY WITH PROPOFOL;  Surgeon: Midge Minium, MD;  Location: [facility] ENDOSCOPY;  Service: Endoscopy;  Laterality: N/A;   history of abd ultrasound  11/01   fatty liver   MULTIPLE TOOTH EXTRACTIONS     ORIF FIBULA FRACTURE Left 01/06/2017   Procedure: OPEN REDUCTION INTERNAL FIXATION (ORIF) FIBULA FRACTURE DISTAL FIBULA;  Surgeon: Marcene Corning, MD;  Location: [facility] OR;  Service: Orthopedics;  Laterality: Left;  Patient states has problems if he will have a tube in throat for Genera; Anesthesia      Allergies:   Morphine and codeine, Percocet [oxycodone-acetaminophen], and Valium   Social History   Tobacco Use   Smoking status: Former    Years: 1    Types: Cigarettes   Smokeless tobacco: Never   Tobacco comments:    stopped in 20's  Vaping Use   Vaping Use: Never used  Substance Use Topics   Alcohol use: No   Drug use: No     Current Outpatient Medications on File Prior to Visit  Medication Sig Dispense Refill   albuterol (PROVENTIL) (2.5 MG/3ML) 0.083% nebulizer solution Take 3 mLs (2.5 mg total) by nebulization every 6 (six) hours as needed for wheezing or shortness of breath. 75 mL 4   cetirizine (ZYRTEC) 10 MG tablet TAKE 1 TABLET(10 MG) BY MOUTH DAILY 90 tablet 3   Cholecalciferol (VITAMIN D) 1000 UNITS capsule Take one tablet once daily 90 capsule 3   ELIQUIS 5 MG TABS tablet TAKE 1 TABLET BY MOUTH TWICE DAILY 180 tablet 3   fluticasone (FLONASE) 50 MCG/ACT nasal spray Place 1 spray into  both nostrils daily as needed for allergies or rhinitis. 16 g 3   fluticasone-salmeterol (ADVAIR DISKUS) 250-50 MCG/ACT AEPB Inhale 1 puff into the lungs in the morning and at bedtime. 60 each 5   furosemide (LASIX) 40 MG tablet Take 1 tablet (40 mg total) by mouth daily. 90 tablet 3   gabapentin (NEURONTIN) 300 MG capsule TAKE 1 CAPSULE(300 MG) BY MOUTH TWICE DAILY 180 capsule 1   guaiFENesin (MUCINEX) 600 MG 12 hr tablet Take 2 tablets (1,200 mg total) by mouth 2 (two) times daily as needed for cough or to loosen phlegm.     levothyroxine (SYNTHROID) 25 MCG tablet TAKE 1 TABLET BY MOUTH EVERY DAY BEFORE BREAKFAST AS DIRECTED 90 tablet 1   lidocaine (LIDODERM) 5 % Place 1 patch onto the skin daily. Remove & Discard patch within 12 hours or as directed by MD 15 patch 0   metoprolol tartrate (LOPRESSOR) 25 MG tablet Take 0.5 tablets (12.5 mg total) by mouth 2 (two) times daily. 90 tablet 1   OXYGEN Inhale 4 L into the lungs at bedtime.      pantoprazole (PROTONIX) 40 MG tablet TAKE 1  TABLET(40 MG) BY MOUTH AT BEDTIME 90 tablet 3   potassium chloride (KLOR-CON M) 10 MEQ tablet Take 1 tablet (10 mEq total) by mouth daily. 90 tablet 3   pravastatin (PRAVACHOL) 40 MG tablet TAKE 1 TABLET BY MOUTH EVERY DAY 90 tablet 1   No current facility-administered medications on file prior to visit.     Family Hx: The patient's family history includes Atrial fibrillation in his brother; Cancer in an other family member; Heart disease in his brother; Other in his mother; Stroke in his father and paternal grandfather. There is no history of Prostate cancer, Kidney cancer, or Bladder Cancer.  ROS:   Please see the history of present illness.    Review of Systems  Constitutional: Negative.   HENT: Negative.    Respiratory:  Positive for cough.   Cardiovascular: Negative.   Gastrointestinal: Negative.   Musculoskeletal: Negative.   Neurological: Negative.   Psychiatric/Behavioral: Negative.    All other systems reviewed and are negative.    Labs/Other Tests and Data Reviewed:    Recent Labs: 12/26/2022: ALT 30; BUN 19; Creat 1.03; Hemoglobin 15.3; Platelets 190; Potassium 4.5; Sodium 144; TSH 1.57   Recent Lipid Panel Lab Results  Component Value Date/Time   CHOL 110 12/26/2022 12:32 PM   CHOL 169 05/16/2016 10:35 AM   TRIG 143 12/26/2022 12:32 PM   HDL 33 (L) 12/26/2022 12:32 PM   HDL 37 (L) 05/16/2016 10:35 AM   CHOLHDL 3.3 12/26/2022 12:32 PM   LDLCALC 54 12/26/2022 12:32 PM   LDLDIRECT 97.4 11/29/2010 09:21 AM    Wt Readings from Last 3 Encounters:  05/23/23 184 lb 8 oz (83.7 kg)  04/12/23 188 lb 9.6 oz (85.5 kg)  12/26/22 186 lb 9.6 oz (84.6 kg)     Exam:    Vital Signs: Vital signs Barco also be detailed in the HPI BP (!) 150/68 (BP Location: Left Arm, Patient Position: Sitting, Cuff Size: Normal)   Pulse (!) 59   Ht 5\' 6"  (1.676 m)   Wt 184 lb 8 oz (83.7 kg)   SpO2 98%   BMI 29.78 kg/m   Constitutional:  oriented to person, place, and time. No distress.   HENT:  Head: Grossly normal Eyes:  no discharge. No scleral icterus.  Neck: No JVD, no carotid bruits  Cardiovascular: Irregularly irregular,  no murmurs appreciated, trace lower extremity bilateral edema Pulmonary/Chest: Clear to auscultation bilaterally, no wheezes or rales Abdominal: Soft.  no distension.  no tenderness.  Musculoskeletal: Normal range of motion Neurological:  normal muscle tone. Coordination normal. No atrophy Skin: Skin warm and dry Psychiatric: normal affect, pleasant  ASSESSMENT & PLAN:    Problem List Items Addressed This Visit       Cardiology Problems   Ascending aortic aneurysm (HCC)   Mixed hyperlipidemia   PAF (paroxysmal atrial fibrillation) (HCC) - Primary   Chronic diastolic CHF (congestive heart failure) (HCC)     Other   Chronic bronchitis (HCC)   Chronic respiratory failure with hypoxia (HCC)   CKD (chronic kidney disease), stage III (HCC)   Other Visit Diagnoses     HYPERTENSION, BENIGN ESSENTIAL         PAF (paroxysmal atrial fibrillation) (HCC) Off amiodarone,  Based on previous discussions, will continue to pursue rate control On previous discussion, comfortable with discussion for rate control Continue current dose of metoprolol to tartrate 12.5 twice daily Tolerating Eliquis 5 twice daily but having more falls Recommend he talk with primary care for PT at home Does not meet criteria for reduced dose Eliquis  Dilated ascending aorta Mildly dilated, no further work-up needed   Chronic heart failure with preserved ejection fraction (HCC) Continue Lasix 40 daily with potassium Stable lab work   Chronic respiratory distress/COPD followed by pulmonary, no recent COPD exacerbation On inhalers   Mixed hyperlipidemia Cholesterol is at goal on the current lipid regimen. No changes to the medications were made.   HYPERTENSION, BENIGN ESSENTIAL Blood pressure is well controlled on today's visit. No changes made to the  medications.   Hypothyroidism TSH normal    Total encounter time more than 30 minutes  Greater than 50% was spent in counseling and coordination of care with the patient   Signed, Julien Nordmann, MD  [facility] [address], [place], [place] [address]

## 2023-05-23 ENCOUNTER — Encounter: Payer: Self-pay | Admitting: Cardiovascular Disease

## 2023-05-23 ENCOUNTER — Ambulatory Visit: Payer: PPO | Attending: Cardiovascular Disease | Admitting: Cardiovascular Disease

## 2023-05-23 VITALS — BP 150/68 | HR 59 | Ht 66.0 in | Wt 184.5 lb

## 2023-05-23 DIAGNOSIS — J9611 Chronic respiratory failure with hypoxia: Secondary | ICD-10-CM | POA: Diagnosis not present

## 2023-05-23 DIAGNOSIS — I480 Paroxysmal atrial fibrillation: Secondary | ICD-10-CM | POA: Diagnosis not present

## 2023-05-23 DIAGNOSIS — J42 Unspecified chronic bronchitis: Secondary | ICD-10-CM

## 2023-05-23 DIAGNOSIS — I10 Essential (primary) hypertension: Secondary | ICD-10-CM | POA: Diagnosis not present

## 2023-05-23 DIAGNOSIS — I7121 Aneurysm of the ascending aorta, without rupture: Secondary | ICD-10-CM

## 2023-05-23 DIAGNOSIS — I5032 Chronic diastolic (congestive) heart failure: Secondary | ICD-10-CM | POA: Diagnosis not present

## 2023-05-23 DIAGNOSIS — E782 Mixed hyperlipidemia: Secondary | ICD-10-CM | POA: Diagnosis not present

## 2023-05-23 DIAGNOSIS — N1832 Chronic kidney disease, stage 3b: Secondary | ICD-10-CM | POA: Diagnosis not present

## 2023-05-23 DIAGNOSIS — I4821 Permanent atrial fibrillation: Secondary | ICD-10-CM | POA: Diagnosis not present

## 2023-05-23 NOTE — Patient Instructions (Signed)
Medication Instructions:  No changes  If you need a refill on your cardiac medications before your next appointment, please call your pharmacy.   Lab work: No new labs needed  Testing/Procedures: No new testing needed  Follow-Up: At [facility], you and your health needs are our priority.  As part of our continuing mission to provide you with exceptional heart care, we have created designated Provider Care Teams.  These Care Teams include your primary Cardiologist (physician) and Advanced Practice Providers (APPs -  Physician Assistants and Nurse Practitioners) who all work together to provide you with the care you need, when you need it.  You will need a follow up appointment in 12 months  Providers on your designated Care Team:   Christopher Berge, NP Ryan Dunn, PA-C Cadence Furth, PA-C  COVID-19 Vaccine Information can be found at: [email or web address] For questions related to vaccine distribution or appointments, please email [email or web address] or call [phone].   

## 2023-06-14 DIAGNOSIS — S93401A Sprain of unspecified ligament of right ankle, initial encounter: Secondary | ICD-10-CM | POA: Diagnosis not present

## 2023-06-23 ENCOUNTER — Encounter: Payer: Self-pay | Admitting: Nurse Practitioner

## 2023-06-23 ENCOUNTER — Telehealth: Payer: Self-pay

## 2023-06-23 ENCOUNTER — Ambulatory Visit (INDEPENDENT_AMBULATORY_CARE_PROVIDER_SITE_OTHER): Payer: PPO | Admitting: Nurse Practitioner

## 2023-06-23 VITALS — BP 124/72 | HR 51 | Temp 97.1°F | Ht 66.0 in | Wt 190.0 lb

## 2023-06-23 DIAGNOSIS — E039 Hypothyroidism, unspecified: Secondary | ICD-10-CM

## 2023-06-23 DIAGNOSIS — J9611 Chronic respiratory failure with hypoxia: Secondary | ICD-10-CM

## 2023-06-23 DIAGNOSIS — M48062 Spinal stenosis, lumbar region with neurogenic claudication: Secondary | ICD-10-CM

## 2023-06-23 DIAGNOSIS — I10 Essential (primary) hypertension: Secondary | ICD-10-CM | POA: Diagnosis not present

## 2023-06-23 DIAGNOSIS — I5032 Chronic diastolic (congestive) heart failure: Secondary | ICD-10-CM

## 2023-06-23 DIAGNOSIS — Z6830 Body mass index (BMI) 30.0-30.9, adult: Secondary | ICD-10-CM | POA: Diagnosis not present

## 2023-06-23 DIAGNOSIS — I480 Paroxysmal atrial fibrillation: Secondary | ICD-10-CM

## 2023-06-23 DIAGNOSIS — M25561 Pain in right knee: Secondary | ICD-10-CM | POA: Diagnosis not present

## 2023-06-23 DIAGNOSIS — K219 Gastro-esophageal reflux disease without esophagitis: Secondary | ICD-10-CM

## 2023-06-23 DIAGNOSIS — E6609 Other obesity due to excess calories: Secondary | ICD-10-CM | POA: Diagnosis not present

## 2023-06-23 DIAGNOSIS — R296 Repeated falls: Secondary | ICD-10-CM

## 2023-06-23 DIAGNOSIS — G3281 Cerebellar ataxia in diseases classified elsewhere: Secondary | ICD-10-CM | POA: Diagnosis not present

## 2023-06-23 DIAGNOSIS — G8929 Other chronic pain: Secondary | ICD-10-CM

## 2023-06-23 LAB — CBC WITH DIFFERENTIAL/PLATELET
Absolute Monocytes: 586 cells/uL (ref 200–950)
Basophils Absolute: 58 cells/uL (ref 0–200)
Basophils Relative: 1 %
Eosinophils Absolute: 81 cells/uL (ref 15–500)
Eosinophils Relative: 1.4 %
HCT: 43.3 % (ref 38.5–50.0)
Hemoglobin: 14.5 g/dL (ref 13.2–17.1)
Lymphs Abs: 1583 cells/uL (ref 850–3900)
MCH: 31.6 pg (ref 27.0–33.0)
MCHC: 33.5 g/dL (ref 32.0–36.0)
MCV: 94.3 fL (ref 80.0–100.0)
Monocytes Relative: 10.1 %
Neutro Abs: 3492 cells/uL (ref 1500–7800)
RBC: 4.59 10*6/uL (ref 4.20–5.80)
Total Lymphocyte: 27.3 %

## 2023-06-23 NOTE — Progress Notes (Signed)
Careteam: Patient Care Team: Sharon Seller, NP as PCP - General (Geriatric Medicine) Antonieta Iba, MD as PCP - Cardiology (Cardiology) Antonieta Iba, MD as Consulting Physician (Cardiology) Claria Dice, MD as Attending Physician (Physical Medicine and Rehabilitation) Estill Bamberg, MD as Consulting Physician (Orthopedic Surgery) Merwyn Katos, MD (Inactive) as Consulting Physician (Pulmonary Disease)  PLACE OF SERVICE:  [facility]  Advanced Directive information Does Patient Have a Medical Advance Directive?: Yes, Type of Advance Directive: Healthcare Power of [place];Living will, Does patient want to make changes to medical advance directive?: No - Patient declined  Allergies  Allergen Reactions   Morphine And Codeine Shortness Of Breath   Percocet [Oxycodone-Acetaminophen] Shortness Of Breath   Valium Shortness Of Breath    Chief Complaint  Patient presents with   Medical Management of Chronic Issues    6 month follow-up. Discuss need for shingrix and covid booster. [facility] verified. High fall risk, right leg hurting related to fall.      HPI: Patient is a 89 y.o. male for routine follow up.   Reports he has had 3 falls in 3 weeks.  Reports his right leg gave way and he went down all 3 times.  He has not done PT recently and not doing his exercises.  Reports his right knee has chronic pain.  Pain in the back of the knee and goes down the leg.  Pain also on side of knee.   Reports breathing worse when its raining and when its hot.  Having LE edema but this has been ongoing and no worsening of swelling.  No chest pains. He saw cardiologist last month "turned me loose for a year"  Continues on 4L [place] at bedtime.   Back pain well controlled on gabapentin, more arthritis.    Review of Systems:  Review of Systems  Constitutional:  Negative for chills, fever and weight loss.  HENT:  Negative for tinnitus.   Respiratory:  Positive for shortness of breath  (chronic and stable.). Negative for cough and sputum production.   Cardiovascular:  Negative for chest pain, palpitations and leg swelling.  Gastrointestinal:  Negative for abdominal pain, constipation, diarrhea and heartburn.  Genitourinary:  Negative for dysuria, frequency and urgency.  Musculoskeletal:  Positive for back pain, falls, joint pain and myalgias.  Skin: Negative.   Neurological:  Positive for weakness. Negative for dizziness and headaches.  Psychiatric/Behavioral:  Negative for depression and memory loss. The patient does not have insomnia.     Past Medical History:  Diagnosis Date   Anxiety    Arthritis    Atypical chest pain    a. 05/2017 MV: no ischemia, EF 79%.   Chest pain 10/20/2017   Chronic diastolic CHF (congestive heart failure) (HCC)    a. 01/2011 Echo: EF 50-55%, gr1 DD, mild AI, nl RV fxn, mild TR/PR; b. 10/2017 Echo: EF 60-65%, mild LVH, gr2 DD.   DDD (degenerative disc disease), cervical    Depressive disorder, not elsewhere classified    Difficult intubation    Dysphagia, oral phase    Dyspnea    Edema    Gallstones    a. Symptomatic - s/p lap chole 05/2018.   History of DVT (deep vein thrombosis)    History of kidney stones    Hypertension    Hypoxemia    Impacted cerumen    Ischemic colitis (HCC)    a. 02/2018 GIB - colonoscopy w/ isch colitis. Anticoagulation resumed.   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 a. 01/2011 in setting of post-op complications including aspiration pna;  b. CHA2DS2VASc = 4--> Amio/Eliquis.   Pneumonia 03/06/2003   Spinal stenosis, unspecified region other than cervical    Squamous cell carcinoma of skin of trunk, except scrotum    skin  cancer of shoulder   Syncope    a. 10/2017-->Event monitor: RSR, rare PACs/PVCs.   Thoracic or lumbosacral neuritis or radiculitis, unspecified    Past Surgical History:  Procedure Laterality Date   CARDIOVERSION N/A 06/20/2017   Procedure: CARDIOVERSION;  Surgeon: Antonieta Iba, MD;  Location: [facility] ORS;  Service: Cardiovascular;  Laterality: N/A;   CATARACT EXTRACTION W/ INTRAOCULAR LENS  IMPLANT, BILATERAL     CERVICAL FUSION  02/10/2011   CHOLECYSTECTOMY  05/24/2018   CHOLECYSTECTOMY N/A 05/24/2018   Procedure: LAPAROSCOPIC CHOLECYSTECTOMY;  Surgeon: Abigail Miyamoto, MD;  Location: [facility] OR;  Service: General;  Laterality: N/A;   COLONOSCOPY WITH PROPOFOL N/A 02/20/2018   Procedure: COLONOSCOPY WITH PROPOFOL;  Surgeon: Midge Minium, MD;  Location: [facility] ENDOSCOPY;  Service: Endoscopy;  Laterality: N/A;   history of abd ultrasound  11/01   fatty liver   MULTIPLE TOOTH EXTRACTIONS     ORIF FIBULA FRACTURE Left 01/06/2017   Procedure: OPEN REDUCTION INTERNAL FIXATION (ORIF) FIBULA FRACTURE DISTAL FIBULA;  Surgeon: Marcene Corning, MD;  Location: [facility] OR;  Service: Orthopedics;  Laterality: Left;  Patient states has problems if he will have a tube in throat for Genera; Anesthesia   Social History:   reports that he has quit smoking. His smoking use included cigarettes. He has never used smokeless tobacco. He reports that he does not drink alcohol and does not use drugs.  Family History  Problem Relation Age of Onset   Stroke Father    Atrial fibrillation Brother        on coumadin   Heart disease Brother        AFib- coumadin   Stroke Paternal Grandfather    Other Mother        hemorrhage   Cancer Other        colon cancer at early age   Prostate cancer Neg Hx    Kidney cancer Neg Hx    Bladder Cancer Neg Hx     Medications: Patient's Medications  New Prescriptions   No medications on file  Previous Medications   ALBUTEROL (PROVENTIL) (2.5 MG/3ML) 0.083% NEBULIZER SOLUTION     Take 3 mLs (2.5 mg total) by nebulization every 6 (six) hours as needed for wheezing or shortness of breath.   CETIRIZINE (ZYRTEC) 10 MG TABLET    TAKE 1 TABLET(10 MG) BY MOUTH DAILY   CHOLECALCIFEROL (VITAMIN D) 1000 UNITS CAPSULE    Take one tablet once daily   ELIQUIS 5 MG TABS TABLET    TAKE 1 TABLET BY MOUTH TWICE DAILY   FLUTICASONE (FLONASE) 50 MCG/ACT NASAL SPRAY    Place 1 spray into both nostrils daily as needed for allergies or rhinitis.   FLUTICASONE-SALMETEROL (ADVAIR DISKUS) 250-50 MCG/ACT AEPB    Inhale 1 puff into the lungs in the morning and at bedtime.   FUROSEMIDE (  LASIX) 40 MG TABLET    Take 1 tablet (40 mg total) by mouth daily.   GABAPENTIN (NEURONTIN) 300 MG CAPSULE    TAKE 1 CAPSULE(300 MG) BY MOUTH TWICE DAILY   GUAIFENESIN (MUCINEX) 600 MG 12 HR TABLET    Take 2 tablets (1,200 mg total) by mouth 2 (two) times daily as needed for cough or to loosen phlegm.   LEVOTHYROXINE (SYNTHROID) 25 MCG TABLET    TAKE 1 TABLET BY MOUTH EVERY DAY BEFORE BREAKFAST AS DIRECTED   LIDOCAINE (LIDODERM) 5 %    Place 1 patch onto the skin daily. Remove & Discard patch within 12 hours or as directed by MD   METOPROLOL TARTRATE (LOPRESSOR) 25 MG TABLET    Take 0.5 tablets (12.5 mg total) by mouth 2 (two) times daily.   OXYGEN    Inhale 4 L into the lungs at bedtime.    PANTOPRAZOLE (PROTONIX) 40 MG TABLET    TAKE 1 TABLET(40 MG) BY MOUTH AT BEDTIME   POTASSIUM CHLORIDE (KLOR-CON M) 10 MEQ TABLET    Take 1 tablet (10 mEq total) by mouth daily.   PRAVASTATIN (PRAVACHOL) 40 MG TABLET    TAKE 1 TABLET BY MOUTH EVERY DAY  Modified Medications   No medications on file  Discontinued Medications   No medications on file    Physical Exam:  Vitals:   06/23/23 0754  BP: 124/72  Pulse: (!) 51  Temp: (!) 97.1 F (36.2 C)  TempSrc: Temporal  SpO2: 93%  Weight: 190 lb (86.2 kg)  Height: 5\' 6"  (1.676 m)   Body mass index is 30.67 kg/m. Wt Readings from Last 3 Encounters:  06/23/23 190 lb  (86.2 kg)  05/23/23 184 lb 8 oz (83.7 kg)  04/12/23 188 lb 9.6 oz (85.5 kg)    Physical Exam Constitutional:      General: He is not in acute distress.    Appearance: He is well-developed. He is not diaphoretic.  HENT:     Head: Normocephalic and atraumatic.     Right Ear: External ear normal.     Left Ear: External ear normal.     Mouth/Throat:     Pharynx: No oropharyngeal exudate.  Eyes:     Conjunctiva/sclera: Conjunctivae normal.     Pupils: Pupils are equal, round, and reactive to light.  Cardiovascular:     Rate and Rhythm: Normal rate and regular rhythm.     Heart sounds: Normal heart sounds.  Pulmonary:     Effort: Pulmonary effort is normal.     Breath sounds: Normal breath sounds.  Abdominal:     General: Bowel sounds are normal.     Palpations: Abdomen is soft.  Musculoskeletal:     Cervical back: Normal range of motion and neck supple.     Right knee: Decreased range of motion. Tenderness (throughout knee) present.     Right lower leg: Edema (trace) present.     Left lower leg: Edema (trace) present.  Skin:    General: Skin is warm and dry.  Neurological:     Mental Status: He is alert and oriented to person, place, and time.     Motor: Weakness present.     Gait: Gait abnormal.  Psychiatric:        Mood and Affect: Mood normal.     Labs reviewed: Basic Metabolic Panel: Recent Labs    09/23/22 1109 12/26/22 1232  NA 143 144  K 4.9 4.5  CL 104 107  CO2 30  27  GLUCOSE 89 79  BUN 19 19  CREATININE 1.07 1.03  CALCIUM 9.7 9.7  TSH  --  1.57   Liver Function Tests: Recent Labs    12/26/22 1232  AST 34  ALT 30  BILITOT 1.3*  PROT 7.5   No results for input(s): "LIPASE", "AMYLASE" in the last 8760 hours. No results for input(s): "AMMONIA" in the last 8760 hours. CBC: Recent Labs    12/26/22 1232  WBC 6.4  NEUTROABS 3,437  HGB 15.3  HCT 45.4  MCV 96.8  PLT 190   Lipid Panel: Recent Labs    12/26/22 1232  CHOL 110  HDL 33*   LDLCALC 54  TRIG 143  CHOLHDL 3.3   TSH: Recent Labs    12/26/22 1232  TSH 1.57   A1C: Lab Results  Component Value Date   HGBA1C 5.7 (H) 05/16/2016     Assessment/Plan 1. Chronic diastolic CHF (congestive heart failure) (HCC) Euvolemic on lasix, cotninues on metoprolol.   2. Essential hypertension -Blood pressure well controlled, goal bp <140/90 Continue current medications and dietary modifications follow metabolic panel  3. Paroxysmal atrial fibrillation (HCC) -rate controlled on metoprolol and on eliquis for anticoagulation.  - CBC with Differential/Platelet - Complete Metabolic Panel with eGFR  4. Hypothyroidism, unspecified type -tsh at goal on synthroid 25 mcg  5. Chronic respiratory failure with hypoxia (HCC) -continues on O2 at nighttime  6. Cerebellar ataxia in diseases classified elsewhere (HCC) Stable, continues to use walker for support  7. Spinal stenosis, lumbar region, with neurogenic claudication -ongoing, continues gabapentin which is helpful for pain.   8. Class 1 obesity due to excess calories with body mass index (BMI) of 30.0 to 30.9 in adult, unspecified whether serious comorbidity present --education provided on healthy weight loss through increase in physical activity and proper nutrition   9. Chronic pain of right knee -pain and instability of leg due to knee- will have him follow up with ortho and get home health referral - Ambulatory referral to Home Health - Ambulatory referral to Orthopedics  10. Multiple falls - Ambulatory referral to Home Health - Ambulatory referral to Orthopedics  11. Gastroesophageal reflux disease without esophagitis -controlled on protonix.    Return in about 6 months (around 12/24/2023) for routine follow up, labs at appt.  Janene Harvey. Biagio Borg [facility] [phone]

## 2023-06-23 NOTE — Telephone Encounter (Signed)
Order written

## 2023-06-23 NOTE — Telephone Encounter (Signed)
Patient states that he forgot to ask for an order for a new walker when in came in for his appointment today.   Message sent to Abbey Chatters, NP

## 2023-06-23 NOTE — Telephone Encounter (Signed)
Spoke with patient and informed him that order had been written. Patient requested that order be mailed to him.

## 2023-06-24 LAB — COMPLETE METABOLIC PANEL WITH GFR
AG Ratio: 1.5 (calc) (ref 1.0–2.5)
ALT: 32 U/L (ref 9–46)
AST: 35 U/L (ref 10–35)
Albumin: 4.3 g/dL (ref 3.6–5.1)
Alkaline phosphatase (APISO): 52 U/L (ref 35–144)
BUN: 18 mg/dL (ref 7–25)
CO2: 29 mmol/L (ref 20–32)
Calcium: 9.3 mg/dL (ref 8.6–10.3)
Chloride: 105 mmol/L (ref 98–110)
Creat: 1.13 mg/dL (ref 0.70–1.22)
Globulin: 2.8 g/dL (calc) (ref 1.9–3.7)
Glucose, Bld: 97 mg/dL (ref 65–99)
Potassium: 4.6 mmol/L (ref 3.5–5.3)
Sodium: 141 mmol/L (ref 135–146)
Total Bilirubin: 1.3 mg/dL — ABNORMAL HIGH (ref 0.2–1.2)
Total Protein: 7.1 g/dL (ref 6.1–8.1)
eGFR: 62 mL/min/{1.73_m2} (ref >=60)

## 2023-06-24 LAB — CBC WITH DIFFERENTIAL/PLATELET
MPV: 10.6 fL (ref 7.5–12.5)
Neutrophils Relative %: 60.2 %
Platelets: 177 10*3/uL (ref 140–400)
RDW: 12.7 % (ref 11.0–15.0)
WBC: 5.8 10*3/uL (ref 3.8–10.8)

## 2023-06-27 DIAGNOSIS — N529 Male erectile dysfunction, unspecified: Secondary | ICD-10-CM | POA: Diagnosis not present

## 2023-06-27 DIAGNOSIS — I130 Hypertensive heart and chronic kidney disease with heart failure and stage 1 through stage 4 chronic kidney disease, or unspecified chronic kidney disease: Secondary | ICD-10-CM | POA: Diagnosis not present

## 2023-06-27 DIAGNOSIS — R1314 Dysphagia, pharyngoesophageal phase: Secondary | ICD-10-CM | POA: Diagnosis not present

## 2023-06-27 DIAGNOSIS — M48062 Spinal stenosis, lumbar region with neurogenic claudication: Secondary | ICD-10-CM | POA: Diagnosis not present

## 2023-06-27 DIAGNOSIS — J9611 Chronic respiratory failure with hypoxia: Secondary | ICD-10-CM | POA: Diagnosis not present

## 2023-06-27 DIAGNOSIS — M5030 Other cervical disc degeneration, unspecified cervical region: Secondary | ICD-10-CM | POA: Diagnosis not present

## 2023-06-27 DIAGNOSIS — D631 Anemia in chronic kidney disease: Secondary | ICD-10-CM | POA: Diagnosis not present

## 2023-06-27 DIAGNOSIS — J189 Pneumonia, unspecified organism: Secondary | ICD-10-CM | POA: Diagnosis not present

## 2023-06-27 DIAGNOSIS — J441 Chronic obstructive pulmonary disease with (acute) exacerbation: Secondary | ICD-10-CM | POA: Diagnosis not present

## 2023-06-27 DIAGNOSIS — I7121 Aneurysm of the ascending aorta, without rupture: Secondary | ICD-10-CM | POA: Diagnosis not present

## 2023-06-27 DIAGNOSIS — G3281 Cerebellar ataxia in diseases classified elsewhere: Secondary | ICD-10-CM | POA: Diagnosis not present

## 2023-06-27 DIAGNOSIS — M25561 Pain in right knee: Secondary | ICD-10-CM | POA: Diagnosis not present

## 2023-06-27 DIAGNOSIS — E039 Hypothyroidism, unspecified: Secondary | ICD-10-CM | POA: Diagnosis not present

## 2023-06-27 DIAGNOSIS — I4821 Permanent atrial fibrillation: Secondary | ICD-10-CM | POA: Diagnosis not present

## 2023-06-27 DIAGNOSIS — N390 Urinary tract infection, site not specified: Secondary | ICD-10-CM | POA: Diagnosis not present

## 2023-06-27 DIAGNOSIS — Z556 Problems related to health literacy: Secondary | ICD-10-CM | POA: Diagnosis not present

## 2023-06-27 DIAGNOSIS — M4712 Other spondylosis with myelopathy, cervical region: Secondary | ICD-10-CM | POA: Diagnosis not present

## 2023-06-27 DIAGNOSIS — I480 Paroxysmal atrial fibrillation: Secondary | ICD-10-CM | POA: Diagnosis not present

## 2023-06-27 DIAGNOSIS — I5042 Chronic combined systolic (congestive) and diastolic (congestive) heart failure: Secondary | ICD-10-CM | POA: Diagnosis not present

## 2023-06-27 DIAGNOSIS — M1990 Unspecified osteoarthritis, unspecified site: Secondary | ICD-10-CM | POA: Diagnosis not present

## 2023-06-27 DIAGNOSIS — J440 Chronic obstructive pulmonary disease with acute lower respiratory infection: Secondary | ICD-10-CM | POA: Diagnosis not present

## 2023-06-27 DIAGNOSIS — K8020 Calculus of gallbladder without cholecystitis without obstruction: Secondary | ICD-10-CM | POA: Diagnosis not present

## 2023-06-27 DIAGNOSIS — K7689 Other specified diseases of liver: Secondary | ICD-10-CM | POA: Diagnosis not present

## 2023-06-27 DIAGNOSIS — A419 Sepsis, unspecified organism: Secondary | ICD-10-CM | POA: Diagnosis not present

## 2023-06-27 DIAGNOSIS — G894 Chronic pain syndrome: Secondary | ICD-10-CM | POA: Diagnosis not present

## 2023-06-27 DIAGNOSIS — I5032 Chronic diastolic (congestive) heart failure: Secondary | ICD-10-CM | POA: Diagnosis not present

## 2023-06-27 DIAGNOSIS — M25562 Pain in left knee: Secondary | ICD-10-CM | POA: Diagnosis not present

## 2023-06-27 DIAGNOSIS — F329 Major depressive disorder, single episode, unspecified: Secondary | ICD-10-CM | POA: Diagnosis not present

## 2023-06-27 DIAGNOSIS — H269 Unspecified cataract: Secondary | ICD-10-CM | POA: Diagnosis not present

## 2023-06-27 DIAGNOSIS — F419 Anxiety disorder, unspecified: Secondary | ICD-10-CM | POA: Diagnosis not present

## 2023-06-27 DIAGNOSIS — K219 Gastro-esophageal reflux disease without esophagitis: Secondary | ICD-10-CM | POA: Diagnosis not present

## 2023-06-27 DIAGNOSIS — Z981 Arthrodesis status: Secondary | ICD-10-CM | POA: Diagnosis not present

## 2023-06-27 DIAGNOSIS — N183 Chronic kidney disease, stage 3 unspecified: Secondary | ICD-10-CM | POA: Diagnosis not present

## 2023-06-27 DIAGNOSIS — G629 Polyneuropathy, unspecified: Secondary | ICD-10-CM | POA: Diagnosis not present

## 2023-06-27 DIAGNOSIS — J9620 Acute and chronic respiratory failure, unspecified whether with hypoxia or hypercapnia: Secondary | ICD-10-CM | POA: Diagnosis not present

## 2023-06-27 DIAGNOSIS — J42 Unspecified chronic bronchitis: Secondary | ICD-10-CM | POA: Diagnosis not present

## 2023-07-10 ENCOUNTER — Other Ambulatory Visit: Payer: Self-pay

## 2023-07-10 DIAGNOSIS — R296 Repeated falls: Secondary | ICD-10-CM

## 2023-07-10 DIAGNOSIS — G8929 Other chronic pain: Secondary | ICD-10-CM

## 2023-07-10 DIAGNOSIS — M48062 Spinal stenosis, lumbar region with neurogenic claudication: Secondary | ICD-10-CM

## 2023-08-15 ENCOUNTER — Other Ambulatory Visit: Payer: Self-pay | Admitting: Nurse Practitioner

## 2023-08-15 DIAGNOSIS — G8929 Other chronic pain: Secondary | ICD-10-CM

## 2023-08-25 ENCOUNTER — Ambulatory Visit: Payer: PPO | Admitting: Pulmonary Disease

## 2023-08-25 ENCOUNTER — Encounter: Payer: Self-pay | Admitting: Pulmonary Disease

## 2023-08-25 VITALS — BP 120/60 | HR 54 | Temp 98.0°F | Ht 66.0 in | Wt 192.2 lb

## 2023-08-25 DIAGNOSIS — C4440 Unspecified malignant neoplasm of skin of scalp and neck: Secondary | ICD-10-CM

## 2023-08-25 DIAGNOSIS — J449 Chronic obstructive pulmonary disease, unspecified: Secondary | ICD-10-CM

## 2023-08-25 DIAGNOSIS — J9611 Chronic respiratory failure with hypoxia: Secondary | ICD-10-CM

## 2023-08-25 NOTE — Patient Instructions (Signed)
Will arrange for a flutter valve  Use your oxygen whenever your level gets below 90%  Will arrange for referral to dermatology  Follow up in 4 months

## 2023-08-25 NOTE — Progress Notes (Signed)
[facility]  Chief Complaint  Patient presents with   Follow-up    Patient reports SOB, cough, and wheezing. Recurrent coughing spells. Wears oxygen at night only.    Constitutional:  BP 120/60 (BP Location: Left Arm, Patient Position: Sitting, Cuff Size: Normal)   Pulse (!) 54   Temp 98 F (36.7 C) (Temporal)   Ht 5\' 6"  (1.676 m)   Wt 192 lb 3.2 oz (87.2 kg)   SpO2 91%   BMI 31.02 kg/m   Past Medical History:  Anxiety, OA, Chronic diastolic CHF, Depression, Dysphagia, DVT, Nephrolithiasis, HTN, Ischemic colitis, Back pain, HLD, PAF, PNA 2004, Spinal stenosis, COVID 30 December 2020  Past Surgical History:  Eric [name]  has a past surgical history that includes history of abd ultrasound (11/01); Cervical fusion (02/10/2011); Cataract extraction w/ intraocular lens  implant, bilateral; Multiple tooth extractions; ORIF fibula fracture (Left, 01/06/2017); CARDIOVERSION (N/A, 06/20/2017); Colonoscopy with propofol (N/A, 02/20/2018); Cholecystectomy (05/24/2018); and Cholecystectomy (N/A, 05/24/2018).  Brief Summary:  Eric [name] is a 89 y.o. 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      Subjective:   Eric [name] is here with his family.  Has cough with chest congestion.  Not always bringing up phlegm.  Has persistent leg swelling.  Uses oxygen at night.  Not always using during the day.  Wakes up at night around 3 am.  Can eventually fall back to sleep.  Uses albuterol a few times per day.  Has noticed a growth on his scalp.  Physical Exam:   Appearance - uses a walker  ENMT - no sinus tenderness, no oral exudate, no LAN, Mallampati 3 airway, no stridor  Respiratory - scattered rhonchi that clear with cough, no wheeze  CV - s1s2 regular rate and rhythm, no murmurs  Ext - 1+ lower leg edema  Skin - growth on his scalp  Psych - normal mood and affect   Pulmonary testing:  PFT 12/25/18 >> FEV1 1.10 (45%),  FEV1% 64, TLC 3.81 (60%)  Chest Imaging:  CT angio chest 11/02/20 >> b/l GGO in posterior segments of upper lobes and lower lobes HRCT CT chest 08/23/21 >> ascending aorta 4 cm, atherosclerosis, , PA 3.6 cm, calcified Lt hilar LN, diffuse bronchial thickening, scattered plugs, band like scarring and dependent GGO  Sleep Tests:  ONO with 2 liters 05/22/20 >> test time 8 hrs 32 min.  Baseline SpO2 91%, SpO2 low 64%.  Spent 57 min 8 sec with SpO2 < 88%.  SpO2 graph suggestive of REM related desaturation pattern that could be seen in sleep apnea.  Cardiac Tests:  Echo 10/20/17 >> EF 60 to 65%, mild LVH, grade 2 DD  Social History:  Eric [name]  reports that Eric [name] has quit smoking. His smoking use included cigarettes. Eric [name] has never used smokeless tobacco. Eric [name] reports that Eric [name] does not drink alcohol and does not use drugs.  Family History:  His family history includes Atrial fibrillation in his brother; Cancer in an other family member; Heart disease in his brother; Other in his mother; Stroke in his father and paternal grandfather.     Assessment/Plan:   COPD with chronic bronchitis. - continue advair - prn albuterol, mucinex - will arrange for a flutter valve - other inhalers were too expensive based on his insurance plan  Allergic rhinitis. - prn flonase  Dysphagia with chronic aspiration. - bronchial hygiene - discussed symptoms to monitor for that would indicate Eric [name] has  a respiratory infection and would need antibiotics   Chronic respiratory failure with hypoxia. - Eric [name] uses 4 liters oxygen at night - goal SpO2 > 90%  Scalp lesion. - concerning for skin cancer - will arrange for referral to dermatology    Time Spent Involved in Patient Care on Day of Examination:  36 minutes  Follow up:   Patient Instructions  Will arrange for a flutter valve  Use your oxygen whenever your level gets below 90%  Will arrange for referral to dermatology  Follow up in 4 months  Medication List:    Allergies as of 08/25/2023       Reactions   Morphine And Codeine Shortness Of Breath   Percocet [oxycodone-acetaminophen] Shortness Of Breath   Valium Shortness Of Breath        Medication List        Accurate as of August 25, 2023  2:04 PM. If you have any questions, ask your nurse or doctor.          STOP taking these medications    pantoprazole 40 MG tablet Commonly known as: PROTONIX Stopped by: Coralyn Helling       TAKE these medications    albuterol (2.5 MG/3ML) 0.083% nebulizer solution Commonly known as: PROVENTIL Take 3 mLs (2.5 mg total) by nebulization every 6 (six) hours as needed for wheezing or shortness of breath.   cetirizine 10 MG tablet Commonly known as: ZYRTEC TAKE 1 TABLET(10 MG) BY MOUTH DAILY   Eliquis 5 MG Tabs tablet Generic drug: apixaban TAKE 1 TABLET BY MOUTH TWICE DAILY   fluticasone 50 MCG/ACT nasal spray Commonly known as: FLONASE Place 1 spray into both nostrils daily as needed for allergies or rhinitis.   fluticasone-salmeterol 250-50 MCG/ACT Aepb Commonly known as: Advair Diskus Inhale 1 puff into the lungs in the morning and at bedtime.   furosemide 40 MG tablet Commonly known as: LASIX Take 1 tablet (40 mg total) by mouth daily.   gabapentin 300 MG capsule Commonly known as: NEURONTIN TAKE 1 CAPSULE(300 MG) BY MOUTH TWICE DAILY   guaiFENesin 600 MG 12 hr tablet Commonly known as: Mucinex Take 2 tablets (1,200 mg total) by mouth 2 (two) times daily as needed for cough or to loosen phlegm.   levothyroxine 25 MCG tablet Commonly known as: SYNTHROID TAKE 1 TABLET BY MOUTH EVERY DAY BEFORE BREAKFAST AS DIRECTED   lidocaine 5 % Commonly known as: Lidoderm Place 1 patch onto the skin daily. Remove & Discard patch within 12 hours or as directed by MD   metoprolol tartrate 25 MG tablet Commonly known as: LOPRESSOR Take 0.5 tablets (12.5 mg total) by mouth 2 (two) times daily.   OXYGEN Inhale 4 L into the lungs  at bedtime.   potassium chloride 10 MEQ tablet Commonly known as: KLOR-CON M Take 1 tablet (10 mEq total) by mouth daily.   pravastatin 40 MG tablet Commonly known as: PRAVACHOL TAKE 1 TABLET BY MOUTH EVERY DAY   Vitamin D 1000 units capsule Take one tablet once daily        Signature:  Coralyn Helling, MD [facility] Pager - [phone] 08/25/2023, 2:04 PM

## 2023-09-13 ENCOUNTER — Telehealth: Payer: Self-pay

## 2023-09-13 DIAGNOSIS — E039 Hypothyroidism, unspecified: Secondary | ICD-10-CM

## 2023-09-13 MED ORDER — LEVOTHYROXINE SODIUM 25 MCG PO TABS: ORAL_TABLET | ORAL | 1 refills | Status: DC

## 2023-09-13 NOTE — Telephone Encounter (Signed)
Patients daughter in law consulted with me about receiving a fax from patients pharmacy for levothyroxine and nebulizer tubing. I questioned if the nebulizer tubing was provided by the company who supplied the nebulizer, as that is how nebulizer tubing has been addressed in the past. Synetta Fail stated that patient mentioned that it comes from the pharmacy

## 2023-09-13 NOTE — Telephone Encounter (Signed)
Left message on voicemail for patient to return call when available , I need clarification on request for nebulizer tubing.

## 2023-09-14 ENCOUNTER — Emergency Department (HOSPITAL_COMMUNITY): Payer: PPO

## 2023-09-14 ENCOUNTER — Inpatient Hospital Stay (HOSPITAL_COMMUNITY): Payer: PPO

## 2023-09-14 ENCOUNTER — Inpatient Hospital Stay (HOSPITAL_COMMUNITY)
Admission: EM | Admit: 2023-09-14 | Discharge: 2023-09-20 | DRG: 177 | Disposition: A | Discharge status: Home Health | Payer: PPO | Attending: Internal Medicine | Admitting: Internal Medicine

## 2023-09-14 ENCOUNTER — Other Ambulatory Visit: Payer: Self-pay

## 2023-09-14 ENCOUNTER — Encounter (HOSPITAL_COMMUNITY): Payer: Self-pay

## 2023-09-14 DIAGNOSIS — Z86718 Personal history of other venous thrombosis and embolism: Secondary | ICD-10-CM | POA: Diagnosis not present

## 2023-09-14 DIAGNOSIS — J449 Chronic obstructive pulmonary disease, unspecified: Secondary | ICD-10-CM | POA: Insufficient documentation

## 2023-09-14 DIAGNOSIS — Z79890 Hormone replacement therapy (postmenopausal): Secondary | ICD-10-CM | POA: Diagnosis not present

## 2023-09-14 DIAGNOSIS — R001 Bradycardia, unspecified: Secondary | ICD-10-CM | POA: Diagnosis not present

## 2023-09-14 DIAGNOSIS — I10 Essential (primary) hypertension: Secondary | ICD-10-CM | POA: Diagnosis not present

## 2023-09-14 DIAGNOSIS — G629 Polyneuropathy, unspecified: Secondary | ICD-10-CM | POA: Diagnosis present

## 2023-09-14 DIAGNOSIS — J441 Chronic obstructive pulmonary disease with (acute) exacerbation: Principal | ICD-10-CM | POA: Insufficient documentation

## 2023-09-14 DIAGNOSIS — K219 Gastro-esophageal reflux disease without esophagitis: Secondary | ICD-10-CM | POA: Diagnosis present

## 2023-09-14 DIAGNOSIS — R059 Cough, unspecified: Secondary | ICD-10-CM | POA: Diagnosis not present

## 2023-09-14 DIAGNOSIS — J9621 Acute and chronic respiratory failure with hypoxia: Secondary | ICD-10-CM | POA: Diagnosis not present

## 2023-09-14 DIAGNOSIS — Z87891 Personal history of nicotine dependence: Secondary | ICD-10-CM | POA: Diagnosis not present

## 2023-09-14 DIAGNOSIS — Z79899 Other long term (current) drug therapy: Secondary | ICD-10-CM

## 2023-09-14 DIAGNOSIS — I130 Hypertensive heart and chronic kidney disease with heart failure and stage 1 through stage 4 chronic kidney disease, or unspecified chronic kidney disease: Secondary | ICD-10-CM | POA: Diagnosis present

## 2023-09-14 DIAGNOSIS — Z7951 Long term (current) use of inhaled steroids: Secondary | ICD-10-CM

## 2023-09-14 DIAGNOSIS — J690 Pneumonitis due to inhalation of food and vomit: Principal | ICD-10-CM | POA: Diagnosis present

## 2023-09-14 DIAGNOSIS — Z1152 Encounter for screening for COVID-19: Secondary | ICD-10-CM | POA: Diagnosis not present

## 2023-09-14 DIAGNOSIS — Z800 Family history of malignant neoplasm of digestive organs: Secondary | ICD-10-CM

## 2023-09-14 DIAGNOSIS — Z6830 Body mass index (BMI) 30.0-30.9, adult: Secondary | ICD-10-CM

## 2023-09-14 DIAGNOSIS — R0989 Other specified symptoms and signs involving the circulatory and respiratory systems: Secondary | ICD-10-CM | POA: Diagnosis not present

## 2023-09-14 DIAGNOSIS — Z8249 Family history of ischemic heart disease and other diseases of the circulatory system: Secondary | ICD-10-CM | POA: Diagnosis not present

## 2023-09-14 DIAGNOSIS — E039 Hypothyroidism, unspecified: Secondary | ICD-10-CM | POA: Diagnosis present

## 2023-09-14 DIAGNOSIS — Z823 Family history of stroke: Secondary | ICD-10-CM

## 2023-09-14 DIAGNOSIS — Z66 Do not resuscitate: Secondary | ICD-10-CM | POA: Diagnosis not present

## 2023-09-14 DIAGNOSIS — Z7901 Long term (current) use of anticoagulants: Secondary | ICD-10-CM

## 2023-09-14 DIAGNOSIS — K760 Fatty (change of) liver, not elsewhere classified: Secondary | ICD-10-CM | POA: Diagnosis present

## 2023-09-14 DIAGNOSIS — E669 Obesity, unspecified: Secondary | ICD-10-CM | POA: Diagnosis present

## 2023-09-14 DIAGNOSIS — Z885 Allergy status to narcotic agent status: Secondary | ICD-10-CM

## 2023-09-14 DIAGNOSIS — R0602 Shortness of breath: Secondary | ICD-10-CM | POA: Diagnosis not present

## 2023-09-14 DIAGNOSIS — Z85828 Personal history of other malignant neoplasm of skin: Secondary | ICD-10-CM | POA: Diagnosis not present

## 2023-09-14 DIAGNOSIS — Z981 Arthrodesis status: Secondary | ICD-10-CM | POA: Diagnosis not present

## 2023-09-14 DIAGNOSIS — J189 Pneumonia, unspecified organism: Secondary | ICD-10-CM | POA: Diagnosis not present

## 2023-09-14 DIAGNOSIS — Z961 Presence of intraocular lens: Secondary | ICD-10-CM | POA: Diagnosis present

## 2023-09-14 DIAGNOSIS — E782 Mixed hyperlipidemia: Secondary | ICD-10-CM | POA: Diagnosis not present

## 2023-09-14 DIAGNOSIS — I5033 Acute on chronic diastolic (congestive) heart failure: Secondary | ICD-10-CM | POA: Diagnosis not present

## 2023-09-14 DIAGNOSIS — I5032 Chronic diastolic (congestive) heart failure: Secondary | ICD-10-CM | POA: Diagnosis present

## 2023-09-14 DIAGNOSIS — I4821 Permanent atrial fibrillation: Secondary | ICD-10-CM | POA: Insufficient documentation

## 2023-09-14 DIAGNOSIS — Z23 Encounter for immunization: Secondary | ICD-10-CM | POA: Diagnosis not present

## 2023-09-14 DIAGNOSIS — Z888 Allergy status to other drugs, medicaments and biological substances status: Secondary | ICD-10-CM

## 2023-09-14 DIAGNOSIS — Z9981 Dependence on supplemental oxygen: Secondary | ICD-10-CM

## 2023-09-14 DIAGNOSIS — R0902 Hypoxemia: Secondary | ICD-10-CM | POA: Diagnosis not present

## 2023-09-14 DIAGNOSIS — R918 Other nonspecific abnormal finding of lung field: Secondary | ICD-10-CM | POA: Diagnosis not present

## 2023-09-14 DIAGNOSIS — R110 Nausea: Secondary | ICD-10-CM | POA: Diagnosis not present

## 2023-09-14 DIAGNOSIS — I4891 Unspecified atrial fibrillation: Secondary | ICD-10-CM | POA: Diagnosis not present

## 2023-09-14 LAB — CBC WITH DIFFERENTIAL/PLATELET
Abs Immature Granulocytes: 0.08 10*3/uL — ABNORMAL HIGH (ref 0.00–0.07)
Basophils Absolute: 0.1 10*3/uL (ref 0.0–0.1)
Basophils Relative: 0 %
Eosinophils Absolute: 0 10*3/uL (ref 0.0–0.5)
Eosinophils Relative: 0 %
HCT: 43.2 % (ref 39.0–52.0)
Hemoglobin: 13.7 g/dL (ref 13.0–17.0)
Immature Granulocytes: 1 %
Lymphocytes Relative: 13 %
Lymphs Abs: 1.9 10*3/uL (ref 0.7–4.0)
MCH: 30.6 pg (ref 26.0–34.0)
MCHC: 31.7 g/dL (ref 30.0–36.0)
MCV: 96.6 fL (ref 80.0–100.0)
Monocytes Absolute: 0.7 10*3/uL (ref 0.1–1.0)
Monocytes Relative: 4 %
Neutro Abs: 12.1 10*3/uL — ABNORMAL HIGH (ref 1.7–7.7)
Neutrophils Relative %: 82 %
Platelets: 164 10*3/uL (ref 150–400)
RBC: 4.47 MIL/uL (ref 4.22–5.81)
RDW: 13.4 % (ref 11.5–15.5)
WBC: 14.8 10*3/uL — ABNORMAL HIGH (ref 4.0–10.5)
nRBC: 0 % (ref 0.0–0.2)

## 2023-09-14 LAB — COMPREHENSIVE METABOLIC PANEL
ALT: 40 U/L (ref 0–44)
AST: 40 U/L (ref 15–41)
Albumin: 3.7 g/dL (ref 3.5–5.0)
Alkaline Phosphatase: 47 U/L (ref 38–126)
Anion gap: 13 (ref 5–15)
BUN: 25 mg/dL — ABNORMAL HIGH (ref 8–23)
CO2: 23 mmol/L (ref 22–32)
Calcium: 8.9 mg/dL (ref 8.9–10.3)
Chloride: 104 mmol/L (ref 98–111)
Creatinine, Ser: 1.12 mg/dL (ref 0.61–1.24)
GFR, Estimated: >60 mL/min (ref >60)
Glucose, Bld: 139 mg/dL — ABNORMAL HIGH (ref 70–99)
Potassium: 4.1 mmol/L (ref 3.5–5.1)
Sodium: 140 mmol/L (ref 135–145)
Total Bilirubin: 1.7 mg/dL — ABNORMAL HIGH (ref 0.3–1.2)
Total Protein: 6.9 g/dL (ref 6.5–8.1)

## 2023-09-14 LAB — TROPONIN I (HIGH SENSITIVITY)
Troponin I (High Sensitivity): 13 ng/L (ref <18)
Troponin I (High Sensitivity): 17 ng/L (ref <18)

## 2023-09-14 LAB — RESP PANEL BY RT-PCR (RSV, FLU A&B, COVID)  RVPGX2
Influenza A by PCR: NEGATIVE
Influenza B by PCR: NEGATIVE
Resp Syncytial Virus by PCR: NEGATIVE
SARS Coronavirus 2 by RT PCR: NEGATIVE

## 2023-09-14 LAB — BRAIN NATRIURETIC PEPTIDE: B Natriuretic Peptide: 103.8 pg/mL — ABNORMAL HIGH (ref 0.0–100.0)

## 2023-09-14 MED ORDER — FLUTICASONE PROPIONATE 50 MCG/ACT NA SUSP: 1.0000 | Freq: Every day | NASAL | Status: DC | PRN

## 2023-09-14 MED ORDER — PRAVASTATIN SODIUM 40 MG PO TABS
40.0000 mg | ORAL_TABLET | Freq: Every day | ORAL | Status: DC
  Administered 2023-09-14 – 2023-09-19 (×6): 40 mg via ORAL
  Filled 2023-09-14 (×6): qty 1

## 2023-09-14 MED ORDER — IPRATROPIUM-ALBUTEROL 0.5-2.5 (3) MG/3ML IN SOLN
3.0000 mL | RESPIRATORY_TRACT | Status: DC
  Administered 2023-09-15 (×2): 3 mL via RESPIRATORY_TRACT
  Filled 2023-09-14 (×2): qty 3

## 2023-09-14 MED ORDER — FUROSEMIDE 10 MG/ML IJ SOLN
20.0000 mg | Freq: Once | INTRAMUSCULAR | Status: AC
  Administered 2023-09-15: 20 mg via INTRAVENOUS
  Filled 2023-09-14: qty 2

## 2023-09-14 MED ORDER — METHYLPREDNISOLONE SODIUM SUCC 40 MG IJ SOLR
40.0000 mg | Freq: Every day | INTRAMUSCULAR | Status: AC
  Administered 2023-09-15 – 2023-09-17 (×3): 40 mg via INTRAVENOUS
  Filled 2023-09-14 (×3): qty 1

## 2023-09-14 MED ORDER — MOMETASONE FURO-FORMOTEROL FUM 200-5 MCG/ACT IN AERO
2.0000 | INHALATION_SPRAY | Freq: Two times a day (BID) | RESPIRATORY_TRACT | Status: DC
  Administered 2023-09-15 – 2023-09-20 (×9): 2 via RESPIRATORY_TRACT
  Filled 2023-09-14: qty 8.8

## 2023-09-14 MED ORDER — APIXABAN 5 MG PO TABS
5.0000 mg | ORAL_TABLET | Freq: Two times a day (BID) | ORAL | Status: DC
  Administered 2023-09-14 – 2023-09-20 (×12): 5 mg via ORAL
  Filled 2023-09-14 (×12): qty 1

## 2023-09-14 MED ORDER — LEVOTHYROXINE SODIUM 25 MCG PO TABS
25.0000 ug | ORAL_TABLET | Freq: Every day | ORAL | Status: DC
  Administered 2023-09-15 – 2023-09-20 (×6): 25 ug via ORAL
  Filled 2023-09-14 (×6): qty 1

## 2023-09-14 MED ORDER — METOPROLOL TARTRATE 25 MG PO TABS
12.5000 mg | ORAL_TABLET | Freq: Two times a day (BID) | ORAL | Status: DC
  Administered 2023-09-15 – 2023-09-20 (×9): 12.5 mg via ORAL
  Filled 2023-09-14 (×10): qty 1

## 2023-09-14 MED ORDER — GABAPENTIN 100 MG PO CAPS
100.0000 mg | ORAL_CAPSULE | Freq: Two times a day (BID) | ORAL | Status: DC
  Administered 2023-09-14 – 2023-09-20 (×12): 100 mg via ORAL
  Filled 2023-09-14 (×12): qty 1

## 2023-09-14 MED ORDER — SODIUM CHLORIDE 0.9 % IV SOLN
500.0000 mg | INTRAVENOUS | Status: AC
  Administered 2023-09-14 – 2023-09-18 (×5): 500 mg via INTRAVENOUS
  Filled 2023-09-14 (×5): qty 5

## 2023-09-14 MED ORDER — IPRATROPIUM-ALBUTEROL 0.5-2.5 (3) MG/3ML IN SOLN
3.0000 mL | Freq: Once | RESPIRATORY_TRACT | Status: AC
  Administered 2023-09-14: 3 mL via RESPIRATORY_TRACT
  Filled 2023-09-14: qty 3

## 2023-09-14 MED ORDER — METHYLPREDNISOLONE SODIUM SUCC 125 MG IJ SOLR
125.0000 mg | Freq: Once | INTRAMUSCULAR | Status: AC
  Administered 2023-09-14: 125 mg via INTRAVENOUS
  Filled 2023-09-14: qty 2

## 2023-09-14 MED ORDER — GUAIFENESIN 100 MG/5ML PO LIQD
5.0000 mL | ORAL | Status: DC | PRN
  Administered 2023-09-15 – 2023-09-16 (×2): 5 mL via ORAL
  Filled 2023-09-14 (×3): qty 15

## 2023-09-14 MED ORDER — LORATADINE 10 MG PO TABS
10.0000 mg | ORAL_TABLET | Freq: Every day | ORAL | Status: DC
  Administered 2023-09-15 – 2023-09-20 (×6): 10 mg via ORAL
  Filled 2023-09-14 (×6): qty 1

## 2023-09-14 MED ORDER — IPRATROPIUM-ALBUTEROL 0.5-2.5 (3) MG/3ML IN SOLN
3.0000 mL | RESPIRATORY_TRACT | Status: DC | PRN
  Administered 2023-09-15 – 2023-09-16 (×4): 3 mL via RESPIRATORY_TRACT
  Filled 2023-09-14 (×4): qty 3

## 2023-09-14 MED ORDER — SODIUM CHLORIDE 0.9 % IV SOLN
2.0000 g | INTRAVENOUS | Status: AC
  Administered 2023-09-15 – 2023-09-18 (×5): 2 g via INTRAVENOUS
  Filled 2023-09-14 (×5): qty 20

## 2023-09-14 MED ORDER — ALBUTEROL SULFATE (2.5 MG/3ML) 0.083% IN NEBU
5.0000 mg | INHALATION_SOLUTION | Freq: Once | RESPIRATORY_TRACT | Status: AC
  Administered 2023-09-14: 5 mg via RESPIRATORY_TRACT
  Filled 2023-09-14: qty 6

## 2023-09-14 MED ORDER — PANTOPRAZOLE SODIUM 40 MG PO TBEC
40.0000 mg | DELAYED_RELEASE_TABLET | Freq: Every day | ORAL | Status: DC
  Administered 2023-09-14 – 2023-09-19 (×6): 40 mg via ORAL
  Filled 2023-09-14 (×6): qty 1

## 2023-09-14 NOTE — ED Triage Notes (Signed)
Pt arrived via ems to the er for the c/o sudden onset sob today. Nausea present. Wheezing per ems. Hx chf, copd. Duoneb given with noticed improval. RA sat 80% per fire per ems. Pt arrives on 2/L [place] for 93%. EKG was afib. 148/80. Bg 117. 18g LFA. 4mg  zofran given.

## 2023-09-14 NOTE — ED Notes (Addendum)
ED TO INPATIENT HANDOFF REPORT  ED Nurse Name and Phone #: Marcello Moores [phone]  S Name/Age/Gender Eric [name] 89 y.o. male Room/Bed: 033C/033C  Code Status   Code Status: Prior  Home/SNF/Other Home Patient oriented to: self, place, time, and situation Is this baseline? Yes   Triage Complete: Triage complete  Chief Complaint Acute on chronic hypoxic respiratory failure (HCC) [J96.21]  Triage Note Pt arrived via ems to the er for the c/o sudden onset sob today. Nausea present. Wheezing per ems. Hx chf, copd. Duoneb given with noticed improval. RA sat 80% per fire per ems. Pt arrives on 2/L [place] for 93%. EKG was afib. 148/80. Bg 117. 18g LFA. 4mg  zofran given.    Allergies Allergies  Allergen Reactions   Morphine And Codeine Shortness Of Breath   Percocet [Oxycodone-Acetaminophen] Shortness Of Breath   Valium Shortness Of Breath    Level of Care/Admitting Diagnosis ED Disposition     ED Disposition  Admit   Condition  --   Comment  Hospital Area: [facility] [[id number]]  Level of Care: Telemetry Medical [104]  [place] admit [facility] to [facility] or [facility] if equivalent level of care is available:: Yes  Covid Evaluation: Asymptomatic - no recent exposure (last 10 days) testing not required  Diagnosis: Acute on chronic hypoxic respiratory failure [facility]) [[id number]]  Admitting Physician: Darlin Drop [[id number]]  Attending Physician: Darlin Drop [[id number]]  Certification:: I certify this patient will need inpatient services for at least 2 midnights  Expected Medical Readiness: 09/16/2023          B Medical/Surgery History Past Medical History:  Diagnosis Date   Anxiety    Arthritis    Atypical chest pain    a. 05/2017 MV: no ischemia, EF 79%.   Chest pain 10/20/2017   Chronic diastolic CHF (congestive heart failure) (HCC)    a. 01/2011 Echo: EF 50-55%, gr1 DD, mild AI, nl RV fxn, mild TR/PR; b. 10/2017 Echo: EF 60-65%, mild LVH, gr2 DD.   DDD  (degenerative disc disease), cervical    Depressive disorder, not elsewhere classified    Difficult intubation    Dysphagia, oral phase    Dyspnea    Edema    Gallstones    a. Symptomatic - s/p lap chole 05/2018.   History of DVT (deep vein thrombosis)    History of kidney stones    Hypertension    Hypoxemia    Impacted cerumen    Ischemic colitis (HCC)    a. 02/2018 GIB - colonoscopy w/ isch colitis. Anticoagulation resumed.   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 a. 01/2011 in setting of post-op complications including aspiration pna;  b. CHA2DS2VASc = 4--> Amio/Eliquis.   Pneumonia 03/06/2003   Spinal stenosis, unspecified region other than cervical    Squamous cell carcinoma of skin of trunk, except scrotum    skin cancer of shoulder   Syncope    a. 10/2017-->Event monitor: RSR, rare PACs/PVCs.   Thoracic or lumbosacral neuritis or radiculitis, unspecified    Past Surgical History:  Procedure Laterality Date   CARDIOVERSION N/A 06/20/2017   Procedure:  CARDIOVERSION;  Surgeon: Antonieta Iba, MD;  Location: [facility] ORS;  Service: Cardiovascular;  Laterality: N/A;   CATARACT EXTRACTION W/ INTRAOCULAR LENS  IMPLANT, BILATERAL     CERVICAL FUSION  02/10/2011   CHOLECYSTECTOMY  05/24/2018   CHOLECYSTECTOMY N/A 05/24/2018   Procedure: LAPAROSCOPIC CHOLECYSTECTOMY;  Surgeon: Abigail Miyamoto, MD;  Location: [facility] OR;  Service: General;  Laterality: N/A;   COLONOSCOPY WITH PROPOFOL N/A 02/20/2018   Procedure: COLONOSCOPY WITH PROPOFOL;  Surgeon: Midge Minium, MD;  Location: [facility] ENDOSCOPY;  Service: Endoscopy;  Laterality: N/A;   history of abd ultrasound  11/01   fatty liver   MULTIPLE TOOTH EXTRACTIONS      ORIF FIBULA FRACTURE Left 01/06/2017   Procedure: OPEN REDUCTION INTERNAL FIXATION (ORIF) FIBULA FRACTURE DISTAL FIBULA;  Surgeon: Marcene Corning, MD;  Location: [facility] OR;  Service: Orthopedics;  Laterality: Left;  Patient states has problems if he will have a tube in throat for Genera; Anesthesia     A IV Location/Drains/Wounds Patient Lines/Drains/Airways Status     Active Line/Drains/Airways     Name Placement date Placement time Site Days   Peripheral IV 09/14/23 18 G Anterior;Distal;Left Forearm 09/14/23  1547  Forearm  less than 1            Intake/Output Last 24 hours No intake or output data in the 24 hours ending 09/14/23 2138  Labs/Imaging Results for orders placed or performed during the hospital encounter of 09/14/23 (from the past 48 hour(s))  CBC with Differential     Status: Abnormal   Collection Time: 09/14/23  4:07 PM  Result Value Ref Range   WBC 14.8 (H) 4.0 - 10.5 K/uL   RBC 4.47 4.22 - 5.81 MIL/uL   Hemoglobin 13.7 13.0 - 17.0 g/dL   HCT 78.4 69.6 - 29.5 %   MCV 96.6 80.0 - 100.0 fL   MCH 30.6 26.0 - 34.0 pg   MCHC 31.7 30.0 - 36.0 g/dL   RDW 28.4 13.2 - 44.0 %   Platelets 164 150 - 400 K/uL   nRBC 0.0 0.0 - 0.2 %   Neutrophils Relative % 82 %   Neutro Abs 12.1 (H) 1.7 - 7.7 K/uL   Lymphocytes Relative 13 %   Lymphs Abs 1.9 0.7 - 4.0 K/uL   Monocytes Relative 4 %   Monocytes Absolute 0.7 0.1 - 1.0 K/uL   Eosinophils Relative 0 %   Eosinophils Absolute 0.0 0.0 - 0.5 K/uL   Basophils Relative 0 %   Basophils Absolute 0.1 0.0 - 0.1 K/uL   Immature Granulocytes 1 %   Abs Immature Granulocytes 0.08 (H) 0.00 - 0.07 K/uL    Comment: Performed at [facility], [address] [place], [place] [address]  Brain natriuretic peptide     Status: Abnormal   Collection Time: 09/14/23  4:07 PM  Result Value Ref Range   B Natriuretic Peptide 103.8 (H) 0.0 - 100.0 pg/mL    Comment: Performed at [facility], [address], [place], [place]  [address]  Troponin I (High Sensitivity)     Status: None   Collection Time: 09/14/23  4:07 PM  Result Value Ref Range   Troponin I (High Sensitivity) 13 <18 ng/L    Comment: (NOTE) Elevated high sensitivity troponin I (hsTnI) values and significant  changes across serial measurements [place] suggest ACS but many other  chronic and acute conditions are known to elevate hsTnI results.  Refer to the "Links" section for chest pain algorithms  and additional  guidance. Performed at [facility], [address], [place], [place] [address]   Resp panel by RT-PCR (RSV, Flu A&B, Covid) Anterior Nasal Swab     Status: None   Collection Time: 09/14/23  4:07 PM   Specimen: Anterior Nasal Swab  Result Value Ref Range   SARS Coronavirus 2 by RT PCR NEGATIVE NEGATIVE   Influenza A by PCR NEGATIVE NEGATIVE   Influenza B by PCR NEGATIVE NEGATIVE    Comment: (NOTE) The Xpert Xpress SARS-CoV-2/FLU/RSV plus assay is intended as an aid in the diagnosis of influenza from Nasopharyngeal swab specimens and should not be used as a sole basis for treatment. Nasal washings and aspirates are unacceptable for Xpert Xpress SARS-CoV-2/FLU/RSV testing.  Fact Sheet for Patients: BloggerCourse.com  Fact Sheet for Healthcare Providers: SeriousBroker.it  This test is not yet approved or cleared by the [place] FDA and has been authorized for detection and/or diagnosis of SARS-CoV-2 by FDA under an Emergency Use Authorization (EUA). This EUA will remain in effect (meaning this test can be used) for the duration of the COVID-19 declaration under Section 564(b)(1) of the Act, 21 U.S.C. section 360bbb-3(b)(1), unless the authorization is terminated or revoked.     Resp Syncytial Virus by PCR NEGATIVE NEGATIVE    Comment: (NOTE) Fact Sheet for Patients: BloggerCourse.com  Fact Sheet for Healthcare  Providers: SeriousBroker.it  This test is not yet approved or cleared by the [place] FDA and has been authorized for detection and/or diagnosis of SARS-CoV-2 by FDA under an Emergency Use Authorization (EUA). This EUA will remain in effect (meaning this test can be used) for the duration of the COVID-19 declaration under Section 564(b)(1) of the Act, 21 U.S.C. section 360bbb-3(b)(1), unless the authorization is terminated or revoked.  Performed at [facility], [address] [place], [place] [address]   Comprehensive metabolic panel     Status: Abnormal   Collection Time: 09/14/23  5:07 PM  Result Value Ref Range   Sodium 140 135 - 145 mmol/L   Potassium 4.1 3.5 - 5.1 mmol/L   Chloride 104 98 - 111 mmol/L   CO2 23 22 - 32 mmol/L   Glucose, Bld 139 (H) 70 - 99 mg/dL    Comment: Glucose reference range applies only to samples taken after fasting for at least 8 hours.   BUN 25 (H) 8 - 23 mg/dL   Creatinine, Ser 9.52 0.61 - 1.24 mg/dL   Calcium 8.9 8.9 - 84.1 mg/dL   Total Protein 6.9 6.5 - 8.1 g/dL   Albumin 3.7 3.5 - 5.0 g/dL   AST 40 15 - 41 U/L   ALT 40 0 - 44 U/L   Alkaline Phosphatase 47 38 - 126 U/L   Total Bilirubin 1.7 (H) 0.3 - 1.2 mg/dL   GFR, Estimated >32 >44 mL/min    Comment: (NOTE) Calculated using the CKD-EPI Creatinine Equation (2021)    Anion gap 13 5 - 15    Comment: Performed at [facility], [address] [place], [place] [address]  Troponin I (High Sensitivity)     Status: None   Collection Time: 09/14/23  6:31 PM  Result Value Ref Range   Troponin I (High Sensitivity) 17 <18 ng/L    Comment: (NOTE) Elevated high sensitivity troponin I (hsTnI) values and significant  changes across serial measurements [place] suggest ACS but many other  chronic and acute conditions are known to elevate hsTnI results.  Refer to  the "Links" section for chest pain algorithms and additional  guidance. Performed at [facility], [address] [place], [place] [address]    DG Chest Port 1 View  Result Date: 09/14/2023 CLINICAL DATA:  Cough and shortness of breath EXAM: PORTABLE CHEST 1 VIEW COMPARISON:  Radiographs 07/27/2021 FINDINGS: Low lung volumes accentuate cardiomediastinal silhouette and pulmonary vascularity. Streaky opacities in the left perihilar region and lower lung similar to prior likely representing atelectasis. No definite pleural effusion. No pneumothorax. No displaced rib fractures. Cervical spine fusion hardware. IMPRESSION: Low lung volumes.  No acute abnormality. Electronically Signed   By: Minerva Fester M.D.   On: 09/14/2023 18:05    Pending Labs Unresulted Labs (From admission, onward)    None       Vitals/Pain Today's Vitals   09/14/23 2000 09/14/23 2030 09/14/23 2045 09/14/23 2100  BP: 109/67 117/88 121/74 128/61  Pulse: 69 76 76 69  Resp: (!) 22 (!) 22 (!) 23 (!) 26  Temp:      TempSrc:      SpO2: 94% 95% 94% 93%  Weight:      Height:      PainSc:        Isolation Precautions No active isolations  Medications Medications  albuterol (PROVENTIL) (2.5 MG/3ML) 0.083% nebulizer solution 5 mg (5 mg Nebulization Given 09/14/23 1617)  methylPREDNISolone sodium succinate (SOLU-MEDROL) 125 mg/2 mL injection 125 mg (125 mg Intravenous Given 09/14/23 1622)  ipratropium-albuterol (DUONEB) 0.5-2.5 (3) MG/3ML nebulizer solution 3 mL (3 mLs Nebulization Given 09/14/23 2125)    Mobility walks with device     Focused Assessments     R Recommendations: See Admitting Provider Note  Report given to:   Additional Notes:  Normally does not require o2 except for at night. Has required 4LNC.

## 2023-09-14 NOTE — ED Provider Notes (Signed)
[facility]GENC[facility] DE[facility] AT [facility] Provider Note   CSN: [id number] Arrival date & time: 09/14/23  1531     History  Chief Complaint  Patient presents with   Shortness of Breath    Eric [name] is a 89 y.o. male.  Pt complains of increased shortness of breath today.  Pt reports difficulty ambulating due to shortness of breath and pain in his legs.  Patient has O2 at home.  He uses 4 L of O2 at night.  He reports he normally does not use oxygen during the day.  Patient reports he has been on his oxygen all day today and continues to be short of breath.  Patient's family reports patient has been very fatigued and weak.  Patient has a past medical history of congestive heart failure chronic bronchitis chronic kidney disease stage III, hypertension, anemia, hyperlipidemia and ataxia.  EMS reports patient was very short of breath and wheezing when they arrived at his home.  Patient was given a DuoNeb on the way to the emergency department.  Patient reports he had some relief from the breathing treatment.  The history is provided by the patient. No language interpreter was used.  Shortness of Breath Severity:  Severe      Home Medications Prior to Admission medications   Medication Sig Start Date End Date Taking? Authorizing Provider  albuterol (PROVENTIL) (2.5 MG/3ML) 0.083% nebulizer solution Take 3 mLs (2.5 mg total) by nebulization every 6 (six) hours as needed for wheezing or shortness of breath. 01/09/23   Sharon Seller, NP  cetirizine (ZYRTEC) 10 MG tablet TAKE 1 TABLET(10 MG) BY MOUTH DAILY 01/02/23   Sharon Seller, NP  Cholecalciferol (VITAMIN D) 1000 UNITS capsule Take one tablet once daily 08/09/13   Reed, [name] L, DO  ELIQUIS 5 MG TABS tablet TAKE 1 TABLET BY MOUTH TWICE DAILY 04/24/23   Sharon Seller, NP  fluticasone (FLONASE) 50 MCG/ACT nasal spray Place 1 spray into both nostrils daily as needed for allergies or rhinitis. 10/17/22   Coralyn Helling, MD  fluticasone-salmeterol (ADVAIR DISKUS) 250-50 MCG/ACT AEPB Inhale 1 puff into the lungs in the morning and at bedtime. 04/13/23   Coralyn Helling, MD  furosemide (LASIX) 40 MG tablet Take 1 tablet (40 mg total) by mouth daily. 11/21/22   Antonieta Iba, MD  gabapentin (NEURONTIN) 300 MG capsule TAKE 1 CAPSULE(300 MG) BY MOUTH TWICE DAILY 08/15/23   Sharon Seller, NP  guaiFENesin (MUCINEX) 600 MG 12 hr tablet Take 2 tablets (1,200 mg total) by mouth 2 (two) times daily as needed for cough or to loosen phlegm. 07/19/22   Medina-Vargas, Monina C, NP  levothyroxine (SYNTHROID) 25 MCG tablet TAKE 1 TABLET BY MOUTH EVERY DAY BEFORE BREAKFAST AS DIRECTED 09/13/23   Sharon Seller, NP  lidocaine (LIDODERM) 5 % Place 1 patch onto the skin daily. Remove & Discard patch within 12 hours or as directed by MD 11/03/20   Tilden Fossa, MD  metoprolol tartrate (LOPRESSOR) 25 MG tablet Take 0.5 tablets (12.5 mg total) by mouth 2 (two) times daily. 05/16/23   Sharon Seller, NP  OXYGEN Inhale 4 L into the lungs at bedtime.     [provider]  potassium chloride (KLOR-CON M) 10 MEQ tablet Take 1 tablet (10 mEq total) by mouth daily. 11/21/22   Antonieta Iba, MD  pravastatin (PRAVACHOL) 40 MG tablet TAKE 1 TABLET BY MOUTH EVERY DAY 03/29/23   Sharon Seller,  NP      Allergies    Morphine and codeine, Percocet [oxycodone-acetaminophen], and Valium    Review of Systems   Review of Systems  Respiratory:  Positive for shortness of breath.   All other systems reviewed and are negative.   Physical Exam Updated Vital Signs BP 109/67   Pulse 69   Temp 98.7 F (37.1 C) (Oral)   Resp (!) 22   Ht 5\' 6"  (1.676 m)   Wt 87.1 kg   SpO2 94%   BMI 30.99 kg/m  Physical Exam Vitals and nursing note reviewed.  Constitutional:      Appearance: He is well-developed.  HENT:     Head: Normocephalic.  Cardiovascular:     Rate and Rhythm: Normal rate and regular rhythm.  Pulmonary:      Effort: Pulmonary effort is normal.     Breath sounds: Examination of the right-upper field reveals wheezing and rhonchi. Examination of the left-upper field reveals wheezing and rhonchi. Examination of the right-middle field reveals wheezing and rhonchi. Examination of the left-middle field reveals wheezing and rhonchi. Examination of the right-lower field reveals wheezing and rhonchi. Examination of the left-lower field reveals wheezing and rhonchi. Wheezing and rhonchi present. No decreased breath sounds.  Abdominal:     General: There is no distension.     Palpations: Abdomen is soft.  Musculoskeletal:        General: Normal range of motion.     Cervical back: Normal range of motion.  Skin:    General: Skin is warm.  Neurological:     General: No focal deficit present.     Mental Status: He is alert and oriented to person, place, and time.  Psychiatric:        Mood and Affect: Mood normal.     ED Results / Procedures / Treatments   Labs (all labs ordered are listed, but only abnormal results are displayed) Labs Reviewed  CBC WITH DIFFERENTIAL/PLATELET - Abnormal; Notable for the following components:      Result Value   WBC 14.8 (*)    Neutro Abs 12.1 (*)    Abs Immature Granulocytes 0.08 (*)    All other components within normal limits  BRAIN NATRIURETIC PEPTIDE - Abnormal; Notable for the following components:   B Natriuretic Peptide 103.8 (*)    All other components within normal limits  COMPREHENSIVE METABOLIC PANEL - Abnormal; Notable for the following components:   Glucose, Bld 139 (*)    BUN 25 (*)    Total Bilirubin 1.7 (*)    All other components within normal limits  RESP PANEL BY RT-PCR (RSV, FLU A&B, COVID)  [id number]  TROPONIN I (HIGH SENSITIVITY)  TROPONIN I (HIGH SENSITIVITY)    EKG None  Radiology DG Chest Port 1 View  Result Date: 09/14/2023 CLINICAL DATA:  Cough and shortness of breath EXAM: PORTABLE CHEST 1 VIEW COMPARISON:  Radiographs 07/27/2021  FINDINGS: Low lung volumes accentuate cardiomediastinal silhouette and pulmonary vascularity. Streaky opacities in the left perihilar region and lower lung similar to prior likely representing atelectasis. No definite pleural effusion. No pneumothorax. No displaced rib fractures. Cervical spine fusion hardware. IMPRESSION: Low lung volumes.  No acute abnormality. Electronically Signed   By: Minerva Fester M.D.   On: 09/14/2023 18:05    Procedures Procedures    Medications Ordered in ED Medications  albuterol (PROVENTIL) (2.5 MG/3ML) 0.083% nebulizer solution 5 mg (5 mg Nebulization Given 09/14/23 1617)  methylPREDNISolone sodium succinate (SOLU-MEDROL) 125 mg/2 mL injection  125 mg (125 mg Intravenous Given 09/14/23 1622)    ED Course/ Medical Decision Making/ A&P                                 Medical Decision Making Patient complains of increasing shortness of breath today.  Patient unable to tolerate normal activities due to shortness of breath.  Patient has a past medical history of chronic lung disease  Amount and/or Complexity of Data Reviewed Independent Historian: EMS    Details: EMS reports patient was wheezing and very short of breath.  Patient had improvement with DuoNeb External Data Reviewed: notes.    Details: Primary care notes reviewed Labs: ordered. Decision-making details documented in ED Course.    Details: Was ordered reviewed and interpreted.  Patient has a slightly elevated white blood cell count of 14.  BNP is 103 Radiology: ordered and independent interpretation performed. Decision-making details documented in ED Course.    Details: Chest x-ray ordered reviewed and interpreted.  X-ray shows no acute changes ECG/medicine tests: ordered and independent interpretation performed. Decision-making details documented in ED Course.    Details: EKG shows A-fib at 91 Discussion of management or test interpretation with external provider(s): Hospitalist consulted for  admission  Risk Prescription drug management. Risk Details: Patient reports feeling better after breathing treatment by EMS.  Patient still has faint wheezing he is given a second albuterol treatment.  Patient is given Solu-Medrol 125 mg IV.  Patient observed while awaiting laboratory evaluations.  Patient's oxygen level remains in the upper 90s while on 4 L.  Patient reevaluated and has slight wheezing.  I discussed patient with family.  They do not feel like he is improved enough to go home.           Final Clinical Impression(s) / ED Diagnoses Final diagnoses:  None    Rx / DC Orders ED Discharge Orders     None         Osie Cheeks 09/14/23 2116    Royanne Foots, DO 09/18/23 2025

## 2023-09-14 NOTE — Telephone Encounter (Signed)
Patient is currently in the hospital. I was going to call and discuss request for nebulizer tubing, however will hold off until patient released from the hospital.

## 2023-09-14 NOTE — H&P (Addendum)
History and Physical  Eric [name] ZOX:[id number] DOB: [birth year] DOA: 09/14/2023  Referring physician: Langston Masker, PA-EDP  PCP: Sharon Seller, NP  Outpatient Specialists: Pulmonary Patient coming from: Home.  Chief Complaint: Worsening shortness of breath.  HPI: Eric [name] is a 89 y.o. male with medical history significant for COPD/chronic bronchitis, history of dysphagia unspecified, nocturnal hypoxia on 4 L nasal cannula nightly, ambulatory dysfunction with walker, hypothyroidism, permanent A-fib on Eliquis, hyperlipidemia, GERD, who presented with complaints of progressively worsening shortness of breath for the past few days.  Associated with generalized weakness.  The patient is not normally on oxygen during the day however has been using 4 L nasal cannula continuously.  EMS was activated.  Upon EMS arrival the patient was noted to be wheezing.  Received IV Solu-Medrol, DuoNebs en route.  In the ED, rales noted on lung auscultation, bilateral lower extremity 3+ edema, right JVD.  Due to concern for COPD and chronic diastolic CHF exacerbation, EDP requested admission.  Admitted by [facility], hospitalist service.  ED Course: Temperature 98.7.  BP 115/66, pulse 78, respiration rate 25, O2 saturation 95% on 4 L.  Lab studies notable for serum glucose 139, BUN 25, creatinine 1.12 with GFR greater than 60.  T. bili 1.7.  WBC 14.8.  Neutrophil count 12.1.  Chest x-ray, personally reviewed showing bibasilar opacities.  Will obtain a CT scan to further assess.  Review of Systems: Review of systems as noted in the HPI. All other systems reviewed and are negative.   Past Medical History:  Diagnosis Date   Anxiety    Arthritis    Atypical chest pain    a. 05/2017 MV: no ischemia, EF 79%.   Chest pain 10/20/2017   Chronic diastolic CHF (congestive heart failure) (HCC)    a. 01/2011 Echo: EF 50-55%, gr1 DD, mild AI, nl RV fxn, mild TR/PR; b. 10/2017 Echo: EF 60-65%, mild LVH, gr2 DD.   DDD  (degenerative disc disease), cervical    Depressive disorder, not elsewhere classified    Difficult intubation    Dysphagia, oral phase    Dyspnea    Edema    Gallstones    a. Symptomatic - s/p lap chole 05/2018.   History of DVT (deep vein thrombosis)    History of kidney stones    Hypertension    Hypoxemia    Impacted cerumen    Ischemic colitis (HCC)    a. 02/2018 GIB - colonoscopy w/ isch colitis. Anticoagulation resumed.   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 a. 01/2011 in setting of post-op complications including aspiration pna;  b. CHA2DS2VASc = 4--> Amio/Eliquis.   Pneumonia 03/06/2003   Spinal stenosis, unspecified region other than cervical    Squamous cell carcinoma of skin of trunk, except scrotum    skin cancer of shoulder   Syncope    a. 10/2017-->Event monitor: RSR, rare PACs/PVCs.   Thoracic or lumbosacral neuritis or radiculitis, unspecified    Past Surgical History:  Procedure Laterality Date   CARDIOVERSION N/A 06/20/2017   Procedure: CARDIOVERSION;  Surgeon: Antonieta Iba, MD;  Location: [facility] ORS;  Service: Cardiovascular;  Laterality: N/A;   CATARACT EXTRACTION W/ INTRAOCULAR LENS  IMPLANT, BILATERAL     CERVICAL FUSION  02/10/2011   CHOLECYSTECTOMY  05/24/2018   CHOLECYSTECTOMY N/A 05/24/2018   Procedure: LAPAROSCOPIC CHOLECYSTECTOMY;  Surgeon: Abigail Miyamoto, MD;  Location: [facility] OR;  Service: General;  Laterality: N/A;   COLONOSCOPY WITH PROPOFOL N/A 02/20/2018   Procedure: COLONOSCOPY WITH PROPOFOL;  Surgeon: Midge Minium, MD;  Location: [facility] ENDOSCOPY;  Service: Endoscopy;  Laterality: N/A;   history of abd ultrasound  11/01   fatty liver   MULTIPLE TOOTH EXTRACTIONS      ORIF FIBULA FRACTURE Left 01/06/2017   Procedure: OPEN REDUCTION INTERNAL FIXATION (ORIF) FIBULA FRACTURE DISTAL FIBULA;  Surgeon: Marcene Corning, MD;  Location: [facility] OR;  Service: Orthopedics;  Laterality: Left;  Patient states has problems if he will have a tube in throat for Genera; Anesthesia    Social History:  reports that he has quit smoking. His smoking use included cigarettes. He has never used smokeless tobacco. He reports that he does not drink alcohol and does not use drugs.   Allergies  Allergen Reactions   Morphine And Codeine Shortness Of Breath   Percocet [Oxycodone-Acetaminophen] Shortness Of Breath   Valium Shortness Of Breath    Family History  Problem Relation Age of Onset   Stroke Father    Atrial fibrillation Brother        on coumadin   Heart disease Brother        AFib- coumadin   Stroke Paternal Grandfather    Other Mother        hemorrhage   Cancer Other        colon cancer at early age   Prostate cancer Neg Hx    Kidney cancer Neg Hx    Bladder Cancer Neg Hx       Prior to Admission medications   Medication Sig Start Date End Date Taking? Authorizing Provider  albuterol (PROVENTIL) (2.5 MG/3ML) 0.083% nebulizer solution Take 3 mLs (2.5 mg total) by nebulization every 6 (six) hours as needed for wheezing or shortness of breath. 01/09/23  Yes Sharon Seller, NP  cetirizine (ZYRTEC) 10 MG tablet TAKE 1 TABLET(10 MG) BY MOUTH DAILY 01/02/23  Yes Sharon Seller, NP  Cholecalciferol (VITAMIN D) 1000 UNITS capsule Take one tablet once daily 08/09/13  Yes Reed, [name] L, DO  ELIQUIS 5 MG TABS tablet TAKE 1 TABLET BY MOUTH TWICE DAILY 04/24/23  Yes Sharon Seller, NP  fluticasone (FLONASE) 50 MCG/ACT nasal spray Place 1 spray into both nostrils daily as needed for allergies or rhinitis. 10/17/22  Yes Coralyn Helling, MD  fluticasone-salmeterol (ADVAIR DISKUS) 250-50 MCG/ACT AEPB Inhale 1 puff into the lungs in the morning and at bedtime. 04/13/23  Yes Coralyn Helling, MD  gabapentin (NEURONTIN) 300 MG capsule TAKE 1 CAPSULE(300 MG) BY MOUTH TWICE DAILY 08/15/23  Yes Sharon Seller, NP  levothyroxine (SYNTHROID) 25 MCG tablet TAKE 1 TABLET BY MOUTH EVERY DAY BEFORE BREAKFAST AS DIRECTED 09/13/23  Yes Sharon Seller, NP  metoprolol tartrate (LOPRESSOR) 25 MG tablet Take 0.5 tablets (12.5 mg total) by mouth 2 (two) times daily. 05/16/23  Yes Sharon Seller, NP  OXYGEN Inhale 4 L into the lungs at bedtime.    Yes [provider]  pantoprazole (PROTONIX) 40 MG tablet Take 40 mg by mouth at bedtime.   Yes [provider]  pravastatin (PRAVACHOL) 40 MG tablet TAKE 1 TABLET BY MOUTH EVERY DAY Patient taking differently: Take  40 mg by mouth at bedtime. 03/29/23  Yes Sharon Seller, NP    Physical Exam: BP 121/65   Pulse 79   Temp 98.7 F (37.1 C) (Oral)   Resp (!) 26   Ht 5\' 6"  (1.676 m)   Wt 87.1 kg   SpO2 97%   BMI 30.99 kg/m   General: 89 y.o. year-old male well developed well nourished in no acute distress.  Alert and oriented x3. Cardiovascular: Regular rate and rhythm with no rubs or gallops.  No thyromegaly or JVD noted.  3+ pitting edema lower extremities bilaterally. Respiratory: Diffuse rales bilaterally.  Poor inspiratory effort. Abdomen: Soft nontender nondistended with normal bowel sounds x4 quadrants. Muskuloskeletal: No cyanosis or clubbing.  3+ pitting edema in lower extremities bilaterally.   Neuro: CN II-XII intact, strength, sensation, reflexes Skin: No ulcerative lesions noted or rashes Psychiatry: Judgement and insight appear normal. Mood is appropriate for condition and setting          Labs on Admission:  Basic Metabolic Panel: Recent Labs  Lab 09/14/23 1707  NA 140  K 4.1  CL 104  CO2 23  GLUCOSE 139*  BUN 25*  CREATININE 1.12  CALCIUM 8.9   Liver Function Tests: Recent Labs  Lab 09/14/23 1707  AST 40  ALT 40  ALKPHOS 47  BILITOT 1.7*  PROT 6.9  ALBUMIN 3.7   No results  for input(s): "LIPASE", "AMYLASE" in the last 168 hours. No results for input(s): "AMMONIA" in the last 168 hours. CBC: Recent Labs  Lab 09/14/23 1607  WBC 14.8*  NEUTROABS 12.1*  HGB 13.7  HCT 43.2  MCV 96.6  PLT 164   Cardiac Enzymes: No results for input(s): "CKTOTAL", "CKMB", "CKMBINDEX", "TROPONINI" in the last 168 hours.  BNP (last 3 results) Recent Labs    09/14/23 1607  BNP 103.8*    ProBNP (last 3 results) No results for input(s): "PROBNP" in the last 8760 hours.  CBG: No results for input(s): "GLUCAP" in the last 168 hours.  Radiological Exams on Admission: DG Chest Port 1 View  Result Date: 09/14/2023 CLINICAL DATA:  Cough and shortness of breath EXAM: PORTABLE CHEST 1 VIEW COMPARISON:  Radiographs 07/27/2021 FINDINGS: Low lung volumes accentuate cardiomediastinal silhouette and pulmonary vascularity. Streaky opacities in the left perihilar region and lower lung similar to prior likely representing atelectasis. No definite pleural effusion. No pneumothorax. No displaced rib fractures. Cervical spine fusion hardware. IMPRESSION: Low lung volumes.  No acute abnormality. Electronically Signed   By: Minerva Fester M.D.   On: 09/14/2023 18:05    EKG: I independently viewed the EKG done and my findings are as followed: Atrial fibrillation with a rate of 91.  Nonspecific ST-T changes.  QTc 456.  Assessment/Plan Present on Admission:  Acute on chronic hypoxic respiratory failure (HCC)  Principal Problem:   Acute on chronic hypoxic respiratory failure (HCC)  Acute on chronic hypoxic respiratory failure suspect secondary to COPD and chronic diastolic CHF exacerbation At baseline on 4 L nasal cannula nightly Has been requiring 4 L nasal cannula continuously to maintain O2 saturation above 90% Continue to treat underlying conditions Flutter valve Mobilize as tolerated  COPD exacerbation COVID-19 by PCR, influenza A&B by PCR, RSV by PCR, all negative Former smoker,  no recent exposure to tobacco. Continue IV Solu-Medrol 40 mg daily Add Rocephin Add azithromycin for its anti-inflammatory effect. Follow sputum culture  Acute on chronic diastolic CHF Last 2D echo done on 10/20/2017 revealed LVEF 60 to 65% with  no wall motion abnormalities. BNP elevated over 100, bilateral lower extremity 3+ pitting edema Right JVD Obtain 2D echo, follow results IV Lasix 20 mg x1 Start strict I's and O's and daily weight  History of dysphagia with concern for aspiration, POA Per family at bedside, the patient has intermittent dysphagia with solid food Speech therapist consulted for swallow evaluation Aspiration precaution in place Follow CT chest without contrast.  Generalized weakness Ambulatory dysfunction Ambulates with a walker at baseline PT OT assessment Fall precautions  Permanent atrial fibrillation on Eliquis Currently rate controlled Resume home Eliquis Resume home p.o. Lopressor at 12.5 mg twice daily Monitor on telemetry  Obesity BMI 30 Recommend weight loss outpatient with regular physical activity and healthy dieting.  Hypothyroidism Resume home levothyroxine  Hyperlipidemia Resume home pravastatin  Peripheral neuropathy Resume home gabapentin  GERD Resume home PPI.   Time: 75 minutes.   DVT prophylaxis: Home Eliquis.  Code Status: DNR  Family Communication: Updated patient's son and daughter-in-law at bedside.  Disposition Plan: Admitted to telemetry medical unit.  Consults called: None.  Admission status: Inpatient status.   Status is: Inpatient The patient will require at least 2 midnights for further evaluation and treatment of present condition.   Darlin Drop MD [facility] Pager [phone]  If 7PM-7AM, please contact night-coverage [email or web address] Password [facility]  09/14/2023, 10:32 PM

## 2023-09-15 ENCOUNTER — Inpatient Hospital Stay (HOSPITAL_COMMUNITY): Payer: PPO

## 2023-09-15 DIAGNOSIS — I5033 Acute on chronic diastolic (congestive) heart failure: Secondary | ICD-10-CM | POA: Diagnosis not present

## 2023-09-15 DIAGNOSIS — J9621 Acute and chronic respiratory failure with hypoxia: Secondary | ICD-10-CM | POA: Diagnosis not present

## 2023-09-15 LAB — PHOSPHORUS: Phosphorus: 2.4 mg/dL — ABNORMAL LOW (ref 2.5–4.6)

## 2023-09-15 LAB — CBC
HCT: 43 % (ref 39.0–52.0)
Hemoglobin: 13.8 g/dL (ref 13.0–17.0)
MCH: 31.7 pg (ref 26.0–34.0)
MCHC: 32.1 g/dL (ref 30.0–36.0)
MCV: 98.6 fL (ref 80.0–100.0)
Platelets: 161 10*3/uL (ref 150–400)
RBC: 4.36 MIL/uL (ref 4.22–5.81)
RDW: 13.8 % (ref 11.5–15.5)
WBC: 13.1 10*3/uL — ABNORMAL HIGH (ref 4.0–10.5)
nRBC: 0 % (ref 0.0–0.2)

## 2023-09-15 LAB — BASIC METABOLIC PANEL
Anion gap: 11 (ref 5–15)
BUN: 28 mg/dL — ABNORMAL HIGH (ref 8–23)
CO2: 24 mmol/L (ref 22–32)
Calcium: 8.5 mg/dL — ABNORMAL LOW (ref 8.9–10.3)
Chloride: 102 mmol/L (ref 98–111)
Creatinine, Ser: 1.25 mg/dL — ABNORMAL HIGH (ref 0.61–1.24)
GFR, Estimated: 55 mL/min — ABNORMAL LOW (ref >60)
Glucose, Bld: 164 mg/dL — ABNORMAL HIGH (ref 70–99)
Potassium: 4.1 mmol/L (ref 3.5–5.1)
Sodium: 137 mmol/L (ref 135–145)

## 2023-09-15 LAB — MAGNESIUM: Magnesium: 2 mg/dL (ref 1.7–2.4)

## 2023-09-15 LAB — ECHOCARDIOGRAM COMPLETE
Area-P 1/2: 3.3 cm2
Height: 66 in
S' Lateral: 2.9 cm
Weight: 3030 [oz_av]

## 2023-09-15 LAB — PROCALCITONIN: Procalcitonin: 3.24 ng/mL

## 2023-09-15 MED ORDER — METHYLPREDNISOLONE SODIUM SUCC 125 MG IJ SOLR
125.0000 mg | Freq: Once | INTRAMUSCULAR | Status: AC
  Administered 2023-09-15: 125 mg via INTRAVENOUS
  Filled 2023-09-15: qty 2

## 2023-09-15 MED ORDER — ACETAMINOPHEN 325 MG PO TABS
650.0000 mg | ORAL_TABLET | Freq: Four times a day (QID) | ORAL | Status: DC | PRN
  Administered 2023-09-15 – 2023-09-16 (×2): 650 mg via ORAL
  Filled 2023-09-15 (×2): qty 2

## 2023-09-15 MED ORDER — PERFLUTREN LIPID MICROSPHERE
1.0000 mL | INTRAVENOUS | Status: AC | PRN
  Administered 2023-09-15: 2 mL via INTRAVENOUS

## 2023-09-15 MED ORDER — FUROSEMIDE 10 MG/ML IJ SOLN
40.0000 mg | Freq: Once | INTRAMUSCULAR | Status: AC
  Administered 2023-09-15: 40 mg via INTRAVENOUS
  Filled 2023-09-15: qty 4

## 2023-09-15 NOTE — Progress Notes (Signed)
OT Cancellation Note  Patient Details Name: Eric [name] MRN: [id number] DOB: [birth year]   Cancelled Treatment:    Reason Eval/Treat Not Completed: Patient at procedure or test/ unavailable (Will follow.)  [name], [name] 09/15/2023, 12:20 PM [name], OTR/L Acute Rehabilitation Services Office: [phone]

## 2023-09-15 NOTE — Progress Notes (Signed)
PROGRESS NOTE    Eric [name]  ZOX:[id number] DOB: October 10, 1932 DOA: 09/14/2023 PCP: Sharon Seller, NP   Brief Narrative:  89 y.o. male with medical history significant for COPD/chronic bronchitis, history of dysphagia unspecified, nocturnal hypoxia on 4 L nasal cannula nightly, ambulatory dysfunction with walker, hypothyroidism, permanent A-fib on Eliquis, hyperlipidemia, GERD presented with worsening shortness of breath.  On presentation, patient had bilateral lower extremity edema and was tachypneic with WBC of 14.8, chest x-ray showing bibasilar opacities.  He was started on IV Solu-Medrol, antibiotics and Lasix.  Assessment & Plan:   Acute on chronic hypoxic respiratory failure -Possibly from COPD, multifocal pneumonia and CHF exacerbation -Normally uses 4 L oxygen via nasal cannula at night.  Currently requiring supplemental oxygen during daytime as well. -COVID-19/influenza/RSV PCR negative.  CT chest showed possible multifocal pneumonia.  Multifocal pneumonia with concern for aspiration -Continue Rocephin and Zithromax.  Check procalcitonin in AM.  COPD exacerbation -Continue current nebs and inhaler treatment.  Continue Solu-Medrol.  CHF exacerbation -Follow 2D echo.  Received a dose of IV Lasix on admission.  Strict input and output.  Daily weights.  Fluid restriction.  Will give another dose of IV Lasix today if blood pressure allows.  History of dysphagia with concern for aspiration -Patient has had intermittent dysphagia with solid food -SLP evaluation.  Leukocytosis -Monitor  Generalized weakness/ambulatory dysfunction -PT/OT eval  Permanent A-fib on Eliquis -Mild intermittent bradycardia present.  Continue Eliquis.  Outpatient follow-up with cardiology.  Continue metoprolol  Obesity -Outpatient follow-up  Hypothyroidism -Continue levothyroxine  Hyperlipidemia -Continue pravastatin  Peripheral neuropathy -Continue gabapentin  GERD -Continue  PPI   DVT prophylaxis: Eliquis Code Status: DNR Family Communication: None at bedside Disposition Plan: Status is: Inpatient Remains inpatient appropriate because: Of severity of illness    Consultants: None  Procedures: None  Antimicrobials: Rocephin and Zithromax from 09/14/2023 onwards   Subjective: Patient seen and examined at bedside.  Still slightly better but still short of breath with exertion.  No fever, vomiting, chest pain reported.  Objective: Vitals:   09/14/23 2145 09/14/23 2236 09/15/23 0339 09/15/23 0737  BP: 121/65 122/66 (!) 103/54 (!) 107/54  Pulse: 79 (!) 57 (!) 45 (!) 58  Resp: (!) 26 18 18 17   Temp:  97.6 F (36.4 C) 98 F (36.7 C) (!) 97.4 F (36.3 C)  TempSrc:  Oral    SpO2: 97% 94% 97% 93%  Weight:   85.9 kg   Height:        Intake/Output Summary (Last 24 hours) at 09/15/2023 0753 Last data filed at 09/15/2023 0300 Gross per 24 hour  Intake 250 ml  Output --  Net 250 ml   Filed Weights   09/14/23 1546 09/15/23 0339  Weight: 87.1 kg 85.9 kg    Examination:  General exam: Appears calm and comfortable.  On 2 L oxygen by nasal cannula.  Elderly male lying in bed. Respiratory system: Bilateral decreased breath sounds at bases with scattered crackles and some wheezing Cardiovascular system: S1 & S2 heard, mild intermittent bradycardia present gastrointestinal system: Abdomen is nondistended, soft and nontender. Normal bowel sounds heard. Extremities: No cyanosis, clubbing; bilateral lower extremity edema  Central nervous system: Alert and oriented.  Slow to respond.  No focal neurological deficits. Moving extremities Skin: No rashes, lesions or ulcers.  Bilateral lower extremity chronic skin changes present Psychiatry: Flat affect.  Not agitated.   Data Reviewed: I have personally reviewed following labs and imaging studies  CBC: Recent Labs  Lab  09/14/23 1607 09/15/23 0626  WBC 14.8* 13.1*  NEUTROABS 12.1*  --   HGB 13.7 13.8   HCT 43.2 43.0  MCV 96.6 98.6  PLT 164 161   Basic Metabolic Panel: Recent Labs  Lab 09/14/23 1707 09/15/23 0626  NA 140 137  K 4.1 4.1  CL 104 102  CO2 23 24  GLUCOSE 139* 164*  BUN 25* 28*  CREATININE 1.12 1.25*  CALCIUM 8.9 8.5*  MG  --  2.0  PHOS  --  2.4*   GFR: Estimated Creatinine Clearance: 41.1 mL/min (A) (by C-G formula based on SCr of 1.25 mg/dL (H)). Liver Function Tests: Recent Labs  Lab 09/14/23 1707  AST 40  ALT 40  ALKPHOS 47  BILITOT 1.7*  PROT 6.9  ALBUMIN 3.7   No results for input(s): "LIPASE", "AMYLASE" in the last 168 hours. No results for input(s): "AMMONIA" in the last 168 hours. Coagulation Profile: No results for input(s): "INR", "PROTIME" in the last 168 hours. Cardiac Enzymes: No results for input(s): "CKTOTAL", "CKMB", "CKMBINDEX", "TROPONINI" in the last 168 hours. BNP (last 3 results) No results for input(s): "PROBNP" in the last 8760 hours. HbA1C: No results for input(s): "HGBA1C" in the last 72 hours. CBG: No results for input(s): "GLUCAP" in the last 168 hours. Lipid Profile: No results for input(s): "CHOL", "HDL", "LDLCALC", "TRIG", "CHOLHDL", "LDLDIRECT" in the last 72 hours. Thyroid Function Tests: No results for input(s): "TSH", "T4TOTAL", "FREET4", "T3FREE", "THYROIDAB" in the last 72 hours. Anemia Panel: No results for input(s): "VITAMINB12", "FOLATE", "FERRITIN", "TIBC", "IRON", "RETICCTPCT" in the last 72 hours. Sepsis Labs: No results for input(s): "PROCALCITON", "LATICACIDVEN" in the last 168 hours.  Recent Results (from the past 240 hour(s))  Resp panel by RT-PCR (RSV, Flu A&B, Covid) Anterior Nasal Swab     Status: None   Collection Time: 09/14/23  4:07 PM   Specimen: Anterior Nasal Swab  Result Value Ref Range Status   SARS Coronavirus 2 by RT PCR NEGATIVE NEGATIVE Final   Influenza A by PCR NEGATIVE NEGATIVE Final   Influenza B by PCR NEGATIVE NEGATIVE Final    Comment: (NOTE) The Xpert Xpress  SARS-CoV-2/FLU/RSV plus assay is intended as an aid in the diagnosis of influenza from Nasopharyngeal swab specimens and should not be used as a sole basis for treatment. Nasal washings and aspirates are unacceptable for Xpert Xpress SARS-CoV-2/FLU/RSV testing.  Fact Sheet for Patients: BloggerCourse.com  Fact Sheet for Healthcare Providers: SeriousBroker.it  This test is not yet approved or cleared by the [place] FDA and has been authorized for detection and/or diagnosis of SARS-CoV-2 by FDA under an Emergency Use Authorization (EUA). This EUA will remain in effect (meaning this test can be used) for the duration of the COVID-19 declaration under Section 564(b)(1) of the Act, 21 U.S.C. section 360bbb-3(b)(1), unless the authorization is terminated or revoked.     Resp Syncytial Virus by PCR NEGATIVE NEGATIVE Final    Comment: (NOTE) Fact Sheet for Patients: BloggerCourse.com  Fact Sheet for Healthcare Providers: SeriousBroker.it  This test is not yet approved or cleared by the [place] FDA and has been authorized for detection and/or diagnosis of SARS-CoV-2 by FDA under an Emergency Use Authorization (EUA). This EUA will remain in effect (meaning this test can be used) for the duration of the COVID-19 declaration under Section 564(b)(1) of the Act, 21 U.S.C. section 360bbb-3(b)(1), unless the authorization is terminated or revoked.  Performed at [facility], [address], [place], [place] [address]  Radiology Studies: CT CHEST WO CONTRAST  Result Date: 09/15/2023 CLINICAL DATA:  Choking with concern for aspiration EXAM: CT CHEST WITHOUT CONTRAST TECHNIQUE: Multidetector CT imaging of the chest was performed following the standard protocol without IV contrast. RADIATION DOSE REDUCTION: This exam was performed according to the departmental  dose-optimization program which includes automated exposure control, adjustment of the mA and/or kV according to patient size and/or use of iterative reconstruction technique. COMPARISON:  08/21/2021 FINDINGS: Cardiovascular: Cardiac enlargement. No pericardial effusions. Normal caliber thoracic aorta. Calcification of the aorta and coronary arteries. Mediastinum/Nodes: Esophagus is decompressed. Scattered lymph nodes in the mediastinum without pathologic enlargement. Calcified lymph nodes are present consistent with postinflammatory changes. Thyroid gland is unremarkable. Lungs/Pleura: Motion artifact limits the examination. There is evidence of patchy airspace disease in the lungs with linear opacities in the lung bases. This is progressing since previous study and [place] represent multifocal pneumonia. Aspiration would be possible but the pattern is less typical for aspiration. Bronchial wall thickening and bronchial or mucous plugging again demonstrated. No pleural effusion or pneumothorax. Upper Abdomen: Calcified granulomas in the spleen. Surgical absence of the gallbladder. No acute abnormalities. Musculoskeletal: Degenerative changes in the spine. No acute bony abnormalities. Postoperative changes in the cervical spine. IMPRESSION: 1. Progression of patchy pulmonary infiltrates, most likely multifocal pneumonia. Pattern is less consistent with aspiration but remains a possibility. 2. Cardiac enlargement.  Aortic atherosclerosis. Electronically Signed   By: Burman Nieves M.D.   On: 09/15/2023 02:49   DG Chest Port 1 View  Result Date: 09/14/2023 CLINICAL DATA:  Cough and shortness of breath EXAM: PORTABLE CHEST 1 VIEW COMPARISON:  Radiographs 07/27/2021 FINDINGS: Low lung volumes accentuate cardiomediastinal silhouette and pulmonary vascularity. Streaky opacities in the left perihilar region and lower lung similar to prior likely representing atelectasis. No definite pleural effusion. No pneumothorax. No  displaced rib fractures. Cervical spine fusion hardware. IMPRESSION: Low lung volumes.  No acute abnormality. Electronically Signed   By: Minerva Fester M.D.   On: 09/14/2023 18:05        Scheduled Meds:  apixaban  5 mg Oral BID   gabapentin  100 mg Oral BID   ipratropium-albuterol  3 mL Nebulization Q4H   levothyroxine  25 mcg Oral Q0600   loratadine  10 mg Oral Daily   methylPREDNISolone (SOLU-MEDROL) injection  40 mg Intravenous Daily   metoprolol tartrate  12.5 mg Oral BID   mometasone-formoterol  2 puff Inhalation BID   pantoprazole  40 mg Oral QHS   pravastatin  40 mg Oral QHS   Continuous Infusions:  azithromycin 500 mg (09/14/23 2320)   cefTRIAXone (ROCEPHIN)  IV 2 g (09/15/23 0047)          Glade Lloyd, MD [facility] 09/15/2023, 7:53 AM

## 2023-09-15 NOTE — Progress Notes (Signed)
Rapid Nurse called to assess patient; while pt was eating he called out stating he was having a hard time breathing. Duo neb administered, o2 sat was in the 80s on 2L. Pt oxygen increased to 3L with slight improvement of o2 sat but was not maintaining above 92% Pt now on 4L with increased work of breathing. MD notified and placed order for IV lasix and solu-medrol. Symptoms decreased once rapid @ bedside. Granddaughter @ bedside began to feed pt again and coughing episode restarted, o2 sats 94% on 4L.

## 2023-09-15 NOTE — Evaluation (Addendum)
Physical Therapy Evaluation Patient Details Name: Eric [name] MRN: [id number] DOB: 09-Aug-1932 Today's Date: 09/15/2023  History of Present Illness  Pt is an 89 y/o M admitted on 09/14/23 after presenting with c/o progressively worsening SOB for the past few days. Pt is being treated for acute on chronic hypoxic respiratory failure 2/2 COPD & chronic diastolic CHF exacerbation. PMH: COPD, chronic bronchitis, dysphagia, nocturnal hypoxia on 4L O2, hypothyroidism, permanent a-fib on eliquis, HLD, GERD, anxiety, cervical DDD, depressive disorder  Clinical Impression  Pt seen for PT evaluation with pt agreeable to tx. Pt reports prior to admission he was residing with his wife in a home with a ramped entry, receiving HHPT 1x/week. On this date, pt is able to complete bed mobility without assistance but reliance on hospital bed features, STS with min assist fade to CGA with ongoing cuing re: hand placement for increased safety with transfers. Pt ambulates into hallway with RW & CGA with cuing to ambulate within base of AD with fair return demo. Encouraged mobilization while in acute setting & pt agreeable.  (Notified nurse of pt not having flutter valve in room.)    If plan is discharge home, recommend the following: A little help with bathing/dressing/bathroom;A little help with walking and/or transfers;Assistance with cooking/housework;Assist for transportation;Help with stairs or ramp for entrance   Can travel by private vehicle        Equipment Recommendations None recommended by PT  Recommendations for Other Services       Functional Status Assessment Patient has had a recent decline in their functional status and demonstrates the ability to make significant improvements in function in a reasonable and predictable amount of time.     Precautions / Restrictions Precautions Precautions: Fall Restrictions Weight Bearing Restrictions: No      Mobility  Bed Mobility Overal bed mobility:  Modified Independent Bed Mobility: Supine to Sit     Supine to sit: Modified independent (Device/Increase time), HOB elevated, Used rails     General bed mobility comments: cuing to breathe vs holding breath    Transfers Overall transfer level: Needs assistance Equipment used: Rolling walker (2 wheels) Transfers: Sit to/from Stand, Bed to chair/wheelchair/BSC Sit to Stand: Min assist, Contact guard assist   Step pivot transfers: Min assist       General transfer comment: cuing re: hand placement during STS & to reach back for chair prior to sitting.    Ambulation/Gait Ambulation/Gait assistance: Contact guard assist Gait Distance (Feet): 50 Feet Assistive device: Rolling walker (2 wheels) Gait Pattern/deviations: Step-through pattern, Decreased step length - left, Decreased step length - right, Decreased stride length, Decreased dorsiflexion - right, Decreased dorsiflexion - left, Trunk flexed Gait velocity: decreased     General Gait Details: education re: need to ambulate within base of AD with fair return Oceanographer     Tilt Bed    Modified Rankin (Stroke Patients Only)       Balance Overall balance assessment: Needs assistance Sitting-balance support: Feet supported Sitting balance-Leahy Scale: Fair     Standing balance support: During functional activity, Bilateral upper extremity supported, Reliant on assistive device for balance Standing balance-Leahy Scale: Poor                               Pertinent Vitals/Pain Pain Assessment Pain Assessment: Faces Faces Pain Scale: Hurts a little bit  Pain Location: BLE Pain Descriptors / Indicators: Discomfort Pain Intervention(s): Monitored during session    Home Living Family/patient expects to be discharged to:: Private residence Living Arrangements: Spouse/significant other Available Help at Discharge: Family (lives with wife, children able to assist  PRN) Type of Home: House Home Access: Ramped entrance     Alternate Level Stairs-Number of Steps: main level + basement Home Layout: Multi-level;Able to live on main level with bedroom/bathroom Home Equipment: Rolling Walker (2 wheels);Rollator (4 wheels);Lift chair      Prior Function               Mobility Comments: Pt sleeps in recliner, ambulates with RW, notes 3 falls in the past 6 months. Does not drive. Pt reports he's been receiving HHPT services 1x/week. ADLs Comments: Reports he bathes & dresses without assistance.     Extremity/Trunk Assessment   Upper Extremity Assessment Upper Extremity Assessment: Generalized weakness    Lower Extremity Assessment Lower Extremity Assessment: Generalized weakness (pt with RLE weakness during gait, pt refers to it as his "dead leg" & notes he's always drug RLE during gait)    Cervical / Trunk Assessment Cervical / Trunk Assessment: Kyphotic  Communication   Communication Communication: No apparent difficulties  Cognition Arousal: Alert Behavior During Therapy: WFL for tasks assessed/performed Overall Cognitive Status: Within Functional Limits for tasks assessed                                          General Comments General comments (skin integrity, edema, etc.): Pt on 2L/min via nasal cannula, cuing to breathe through nose vs mouth. Lowest SpO2 91%.    Exercises     Assessment/Plan    PT Assessment Patient needs continued PT services  PT Problem List Decreased strength;Cardiopulmonary status limiting activity;Decreased activity tolerance;Decreased balance;Decreased mobility;Decreased safety awareness;Decreased knowledge of use of DME       PT Treatment Interventions DME instruction;Modalities;Gait training;Neuromuscular re-education;Balance training;Stair training;Functional mobility training;Patient/family education;Therapeutic activities;Manual techniques;Therapeutic exercise    PT Goals  (Current goals can be found in the Care Plan section)  Acute Rehab PT Goals Patient Stated Goal: get better PT Goal Formulation: With patient Time For Goal Achievement: 09/29/23 Potential to Achieve Goals: Good    Frequency Min 1X/week     Co-evaluation               AM-PAC PT "6 Clicks" Mobility  Outcome Measure Help needed turning from your back to your side while in a flat bed without using bedrails?: A Little Help needed moving from lying on your back to sitting on the side of a flat bed without using bedrails?: A Lot Help needed moving to and from a bed to a chair (including a wheelchair)?: A Little Help needed standing up from a chair using your arms (e.g., wheelchair or bedside chair)?: A Little Help needed to walk in hospital room?: A Little Help needed climbing 3-5 steps with a railing? : A Lot 6 Click Score: 16    End of Session Equipment Utilized During Treatment: Oxygen Activity Tolerance: Patient tolerated treatment well Patient left: in chair;with chair alarm set;with call bell/phone within reach Nurse Communication: Mobility status PT Visit Diagnosis: Muscle weakness (generalized) (M62.81);Difficulty in walking, not elsewhere classified (R26.2)    Time: 0272-5366 PT Time Calculation (min) (ACUTE ONLY): 24 min   Charges:   PT Evaluation $PT Eval Low Complexity: 1 Low  PT General Charges $$ ACUTE PT VISIT: 1 Visit         Aleda Grana, PT, DPT 09/15/23, 10:08 AM   Sandi Mariscal 09/15/2023, 10:07 AM

## 2023-09-15 NOTE — Evaluation (Signed)
Occupational Therapy Evaluation Patient Details Name: Eric [name] MRN: [id number] DOB: 04-Feb-1932 Today's Date: 09/15/2023   History of Present Illness Pt is an 89 y/o M admitted on 09/14/23 after presenting with c/o progressively worsening SOB for the past few days. Pt is being treated for acute on chronic hypoxic respiratory failure 2/2 COPD & chronic diastolic CHF exacerbation. PMH: COPD, chronic bronchitis, dysphagia, nocturnal hypoxia on 4L O2, hypothyroidism, permanent a-fib on eliquis, HLD, GERD, anxiety, cervical DDD, depressive disorder   Clinical Impression   Pt lives with his wife of 61 years, walks with a RW and reports independence in self care. He has longstanding shoulder limitations. Pt presents with generalized weakness and impaired standing balance requiring min assist for OOB. He needs set up to moderate assistance for ADLs. Pt is currently participating in HHPT, recommending addition of HHPT upon discharge.       If plan is discharge home, recommend the following: A little help with walking and/or transfers;A lot of help with bathing/dressing/bathroom;Assistance with cooking/housework;Help with stairs or ramp for entrance;Assist for transportation    Functional Status Assessment  Patient has had a recent decline in their functional status and demonstrates the ability to make significant improvements in function in a reasonable and predictable amount of time.  Equipment Recommendations  None recommended by OT    Recommendations for Other Services       Precautions / Restrictions Precautions Precautions: Fall Restrictions Weight Bearing Restrictions: No      Mobility Bed Mobility Overal bed mobility: Needs Assistance Bed Mobility: Supine to Sit     Supine to sit: Min assist     General bed mobility comments: use of rail, pulled up on therapist's hand    Transfers Overall transfer level: Needs assistance Equipment used: Rolling walker (2 wheels) Transfers:  Bed to chair/wheelchair/BSC, Sit to/from Stand Sit to Stand: Min assist, Contact guard assist     Step pivot transfers: Min assist     General transfer comment: steadying assist from elevated bed      Balance Overall balance assessment: Needs assistance Sitting-balance support: Feet supported Sitting balance-Leahy Scale: Fair     Standing balance support: During functional activity, Bilateral upper extremity supported, Reliant on assistive device for balance Standing balance-Leahy Scale: Poor                             ADL either performed or assessed with clinical judgement   ADL Overall ADL's : Needs assistance/impaired Eating/Feeding: Independent;Sitting   Grooming: Wash/dry hands;Wash/dry face;Sitting;Set up   Upper Body Bathing: Minimal assistance;Sitting   Lower Body Bathing: Moderate assistance;Sit to/from stand   Upper Body Dressing : Sitting;Minimal assistance Upper Body Dressing Details (indicate cue type and reason): front opening gown Lower Body Dressing: Moderate assistance;Sit to/from stand   Toilet Transfer: Minimal assistance;Stand-pivot;Rolling walker (2 wheels)                   Vision Baseline Vision/History: 1 Wears glasses Ability to See in Adequate Light: 0 Adequate Patient Visual Report: No change from baseline       Perception         Praxis         Pertinent Vitals/Pain Pain Assessment Pain Assessment: No/denies pain     Extremity/Trunk Assessment Upper Extremity Assessment Upper Extremity Assessment: Generalized weakness;RUE deficits/detail;LUE deficits/detail;Right hand dominant RUE Deficits / Details: longstanding shoulder limitations RUE Coordination: decreased gross motor LUE Deficits / Details: longstanding shoulder  limitations LUE Coordination: decreased gross motor   Lower Extremity Assessment Lower Extremity Assessment: Defer to PT evaluation   Cervical / Trunk Assessment Cervical / Trunk  Assessment: Kyphotic   Communication Communication Communication: No apparent difficulties   Cognition Arousal: Alert Behavior During Therapy: WFL for tasks assessed/performed Overall Cognitive Status: Within Functional Limits for tasks assessed                                       General Comments       Exercises     Shoulder Instructions      Home Living Family/patient expects to be discharged to:: Private residence Living Arrangements: Spouse/significant other Available Help at Discharge: Family;Available 24 hours/day;Available PRN/intermittently (51 year old spouse, extended family as needed) Type of Home: House Home Access: Ramped entrance     Home Layout: Multi-level;Able to live on main level with bedroom/bathroom Alternate Level Stairs-Number of Steps: main level + basement   Bathroom Shower/Tub: Chief Strategy Officer: Standard     Home Equipment: Agricultural consultant (2 wheels);Rollator (4 wheels);Lift chair          Prior Functioning/Environment Prior Level of Function : Needs assist             Mobility Comments: Pt sleeps in recliner, ambulates with RW, notes 3 falls in the past 6 months. Does not drive. Pt reports he's been receiving HHPT services 1x/week. ADLs Comments: Reports he bathes & dresses without assistance, assisted for IADLs.        OT Problem List: Decreased strength;Decreased activity tolerance;Impaired balance (sitting and/or standing);Cardiopulmonary status limiting activity      OT Treatment/Interventions: Self-care/ADL training;DME and/or AE instruction;Therapeutic activities;Patient/family education;Balance training    OT Goals(Current goals can be found in the care plan section) Acute Rehab OT Goals OT Goal Formulation: With patient Time For Goal Achievement: 09/29/23 Potential to Achieve Goals: Good ADL Goals Pt Will Perform Grooming: with modified independence;standing Pt Will Perform Lower Body  Bathing: sit to/from stand;with min assist Pt Will Perform Lower Body Dressing: with min assist;sit to/from stand Pt Will Transfer to Toilet: with modified independence;ambulating Pt Will Perform Toileting - Clothing Manipulation and hygiene: with modified independence;sit to/from stand Additional ADL Goal #1: Pt will employ energy conservation and breathing techniques during exertion.  OT Frequency: Min 1X/week    Co-evaluation              AM-PAC OT "6 Clicks" Daily Activity     Outcome Measure Help from another person eating meals?: None Help from another person taking care of personal grooming?: A Little Help from another person toileting, which includes using toliet, bedpan, or urinal?: A Little Help from another person bathing (including washing, rinsing, drying)?: A Lot Help from another person to put on and taking off regular upper body clothing?: A Little Help from another person to put on and taking off regular lower body clothing?: A Lot 6 Click Score: 17   End of Session Equipment Utilized During Treatment: Gait belt;Rolling walker (2 wheels);Oxygen (2L) Nurse Communication: Mobility status;Other (comment) (cleared for PO)  Activity Tolerance: Patient tolerated treatment well Patient left: in chair;with call bell/phone within reach;with chair alarm set  OT Visit Diagnosis: Unsteadiness on feet (R26.81);Other abnormalities of gait and mobility (R26.89);Muscle weakness (generalized) (M62.81);Other (comment) (decreased activity tolerance)                Time:  7829-5621 OT Time Calculation (min): 18 min Charges:  OT General Charges $OT Visit: 1 Visit OT Evaluation $OT Eval Moderate Complexity: 1 Mod [name], OTR/L Acute Rehabilitation Services Office: [phone]  Eric [name], Eric [name] 09/15/2023, 3:17 PM

## 2023-09-15 NOTE — Progress Notes (Deleted)
OT Cancellation Note  Patient Details Name: Eric [name] MRN: [id number] DOB: 1932-06-24   Cancelled Treatment:    Reason Eval/Treat Not Completed:  (Pt opting for comfort care, declined OT. Family hopeful for inpatient hospice care. Signing off.)  Deundre, Thong 09/15/2023, 11:28 AM [name], OTR/L Acute Rehabilitation Services Office: [phone]

## 2023-09-15 NOTE — Plan of Care (Signed)

## 2023-09-15 NOTE — Progress Notes (Signed)
Modified Barium Swallow Study  Patient Details  Name: Eric [name] MRN: [id number] Date of Birth: 1932-05-23  Today's Date: 09/15/2023  Modified Barium Swallow completed.  Full report located under Chart Review in the Imaging Section.  History of Present Illness Eric [name] is a 89 y.o. male with medical history significant for COPD/chronic bronchitis, history of dysphagia with concern for aspiration (solid food per family), nocturnal hypoxia on 4 L nasal cannula nightly, ambulatory dysfunction with walker, hypothyroidism, permanent A-fib on Eliquis, hyperlipidemia, GERD, who presented with complaints of progressively worsening shortness of breath for the past few days.  Associated with generalized weakness.  Acute on chronic hypoxic respiratory failure suspect secondary to COPD and chronic diastolic CHF exacerbation.   Clinical Impression Pt demonstrates mild oropharyngeal dysphagia; mastication is inefficient but thorough. Family complains he eats too quickly; today his mastication was consistently complete, but he did swallow every two bites with vallecular packing. Pt initiated swallow sometimes at the pyriform sinuses allowing for instances of trace penetration of thin liquids. Pt sensed penetration if it descended close to the glottis. After starting solid trials pt had very hard coughing episodes there were unrelated to any airway invasion; Esophageal sweep showed diffuse residue and changes to esophageal tract due to curvature of spine. Suspect cough Eric [name] be related to esophageal stasis than any aspiration. Showed pt and family images and reinforced relatively good ariway protection and swallowing with mild residue. Emphasized benefit of enjoying meals for QOL. Offered HH SLP f/u for meal pacing. but pt said he really didnt want that and daughter was supportive. No SLP f/u needed at this time. Factors that Eric [name] increase risk of adverse event in presence of aspiration Eric [name] & Eric [name] 2021): Frail or  deconditioned;Respiratory or GI disease  Swallow Evaluation Recommendations Liquid Administration via: Cup;Straw Medication Administration: Whole meds with liquid Supervision: Patient able to self-feed Swallowing strategies  : Slow rate;Small bites/sips Postural changes: Stay upright 30-60 min after meals      Eric [name], Eric [name] 09/15/2023,1:50 PM

## 2023-09-16 DIAGNOSIS — J9621 Acute and chronic respiratory failure with hypoxia: Secondary | ICD-10-CM | POA: Diagnosis not present

## 2023-09-16 LAB — CBC WITH DIFFERENTIAL/PLATELET
Abs Immature Granulocytes: 0.14 10*3/uL — ABNORMAL HIGH (ref 0.00–0.07)
Basophils Absolute: 0 10*3/uL (ref 0.0–0.1)
Basophils Relative: 0 %
Eosinophils Absolute: 0 10*3/uL (ref 0.0–0.5)
Eosinophils Relative: 0 %
HCT: 39.9 % (ref 39.0–52.0)
Hemoglobin: 12.6 g/dL — ABNORMAL LOW (ref 13.0–17.0)
Immature Granulocytes: 1 %
Lymphocytes Relative: 8 %
Lymphs Abs: 1.1 10*3/uL (ref 0.7–4.0)
MCH: 30.7 pg (ref 26.0–34.0)
MCHC: 31.6 g/dL (ref 30.0–36.0)
MCV: 97.3 fL (ref 80.0–100.0)
Monocytes Absolute: 0.3 10*3/uL (ref 0.1–1.0)
Monocytes Relative: 2 %
Neutro Abs: 12.7 10*3/uL — ABNORMAL HIGH (ref 1.7–7.7)
Neutrophils Relative %: 89 %
Platelets: 150 10*3/uL (ref 150–400)
RBC: 4.1 MIL/uL — ABNORMAL LOW (ref 4.22–5.81)
RDW: 13.8 % (ref 11.5–15.5)
WBC: 14.2 10*3/uL — ABNORMAL HIGH (ref 4.0–10.5)
nRBC: 0 % (ref 0.0–0.2)

## 2023-09-16 LAB — BASIC METABOLIC PANEL
Anion gap: 9 (ref 5–15)
BUN: 35 mg/dL — ABNORMAL HIGH (ref 8–23)
CO2: 27 mmol/L (ref 22–32)
Calcium: 8.6 mg/dL — ABNORMAL LOW (ref 8.9–10.3)
Chloride: 101 mmol/L (ref 98–111)
Creatinine, Ser: 1.27 mg/dL — ABNORMAL HIGH (ref 0.61–1.24)
GFR, Estimated: 54 mL/min — ABNORMAL LOW (ref >60)
Glucose, Bld: 143 mg/dL — ABNORMAL HIGH (ref 70–99)
Potassium: 4.2 mmol/L (ref 3.5–5.1)
Sodium: 137 mmol/L (ref 135–145)

## 2023-09-16 LAB — MAGNESIUM: Magnesium: 2.2 mg/dL (ref 1.7–2.4)

## 2023-09-16 LAB — PROCALCITONIN: Procalcitonin: 2.39 ng/mL

## 2023-09-16 MED ORDER — IPRATROPIUM-ALBUTEROL 0.5-2.5 (3) MG/3ML IN SOLN
3.0000 mL | Freq: Four times a day (QID) | RESPIRATORY_TRACT | Status: DC
  Administered 2023-09-16 – 2023-09-17 (×6): 3 mL via RESPIRATORY_TRACT
  Filled 2023-09-16 (×6): qty 3

## 2023-09-16 MED ORDER — FUROSEMIDE 10 MG/ML IJ SOLN
40.0000 mg | Freq: Two times a day (BID) | INTRAMUSCULAR | Status: DC
  Administered 2023-09-16 – 2023-09-19 (×7): 40 mg via INTRAVENOUS
  Filled 2023-09-16 (×7): qty 4

## 2023-09-16 MED ORDER — ALBUTEROL SULFATE (2.5 MG/3ML) 0.083% IN NEBU
2.5000 mg | INHALATION_SOLUTION | RESPIRATORY_TRACT | Status: DC | PRN
  Administered 2023-09-18: 2.5 mg via RESPIRATORY_TRACT
  Filled 2023-09-16: qty 3

## 2023-09-16 MED ORDER — GUAIFENESIN-DM 100-10 MG/5ML PO SYRP
10.0000 mL | ORAL_SOLUTION | ORAL | Status: DC | PRN
  Administered 2023-09-16 – 2023-09-17 (×2): 10 mL via ORAL
  Filled 2023-09-16 (×3): qty 10

## 2023-09-16 NOTE — Progress Notes (Addendum)
PROGRESS NOTE    Eric [name]  ION:[id number] DOB: February 05, 1932 DOA: 09/14/2023 PCP: Eric Seller, NP   Brief Narrative:  89 y.o. male with medical history significant for COPD/chronic bronchitis, history of dysphagia unspecified, nocturnal hypoxia on 4 L nasal cannula nightly, ambulatory dysfunction with walker, hypothyroidism, permanent A-fib on Eliquis, hyperlipidemia, GERD presented with worsening shortness of breath.  On presentation, patient had bilateral lower extremity edema and was tachypneic with WBC of 14.8, chest x-ray showing bibasilar opacities.  He was started on IV Solu-Medrol, antibiotics and Lasix.  Assessment & Plan:   Acute on chronic hypoxic respiratory failure -Possibly from COPD, multifocal pneumonia and CHF exacerbation -Normally uses 4 L oxygen via nasal cannula at night.  Currently requiring supplemental oxygen during daytime as well. -COVID-19/influenza/RSV PCR negative.  CT chest showed possible multifocal pneumonia.  Multifocal pneumonia with concern for aspiration -Continue Rocephin and Zithromax.    COPD exacerbation -Continue current nebs and inhaler treatment.  Continue Solu-Medrol.  CHF exacerbation -2D echo shows EF of 60 to 65%.  Has received 2 doses of IV Lasix since admission.  Strict input and output.  Daily weights.  Fluid restriction.   -Will give Lasix 40 mg IV every 12 hours if blood pressure allows.  History of dysphagia with concern for aspiration -Patient has had intermittent dysphagia with solid food -Diet as per SLP recommendations..  Leukocytosis -Monitor  Generalized weakness/ambulatory dysfunction -PT/OT recommend home and PT/OT  Permanent A-fib on Eliquis -Rate mostly controlled.  Continue Eliquis.  Outpatient follow-up with cardiology.  Continue metoprolol  Obesity -Outpatient follow-up  Hypothyroidism -Continue levothyroxine  Hyperlipidemia -Continue pravastatin  Peripheral neuropathy -Continue  gabapentin  GERD -Continue PPI   DVT prophylaxis: Eliquis Code Status: DNR Family Communication: None at bedside Disposition Plan: Status is: Inpatient Remains inpatient appropriate because: Of severity of illness    Consultants: None  Procedures: None  Antimicrobials: Rocephin and Zithromax from 09/14/2023 onwards   Subjective: Patient seen and examined at bedside.  Still complains of intermittent shortness of breath.  No fever, chest pain, vomiting reported. Objective: Vitals:   09/15/23 2124 09/16/23 0500 09/16/23 0639 09/16/23 0747  BP: 124/67  (!) 140/69 113/64  Pulse: 70  69 61  Resp: 20  17 19   Temp: (!) 97.4 F (36.3 C)  (!) 97.4 F (36.3 C) 97.8 F (36.6 C)  TempSrc: Oral  Oral Oral  SpO2: 96%  94% 93%  Weight:  86.9 kg    Height:        Intake/Output Summary (Last 24 hours) at 09/16/2023 0752 Last data filed at 09/16/2023 0602 Gross per 24 hour  Intake --  Output 1425 ml  Net -1425 ml   Filed Weights   09/14/23 1546 09/15/23 0339 09/16/23 0500  Weight: 87.1 kg 85.9 kg 86.9 kg    Examination:  General: On 4 L oxygen via nasal cannula.  No distress.  Elderly male lying in bed.  Chronically ill and deconditioned looking. ENT/neck: No thyromegaly.  JVD is not elevated  respiratory: Decreased breath sounds at bases bilaterally with some crackles; no wheezing  CVS: S1-S2 heard, rate controlled Abdominal: Soft, nontender, slightly distended; no organomegaly,  bowel sounds are heard Extremities: Trace lower extremity edema with chronic skin changes.; no cyanosis  CNS: Awake and alert.  Very slow to respond.  Poor historian.  No focal neurologic deficit.  Moves extremities Lymph: No obvious lymphadenopathy Skin: No obvious ecchymosis/lesions  psych: Mostly flat affect.  Not agitated currently.   Musculoskeletal: No  will remain in effect (meaning this test can be used) for the duration of the COVID-19 declaration under Section 564(b)(1) of the Act, 21 U.S.C. section 360bbb-3(b)(1), unless the authorization is terminated or revoked.  Performed at [facility], [address], [place], [place] [address]          Radiology Studies: DG Swallowing Func-Speech Pathology  Result Date: 09/15/2023 Table formatting from the original result was not included. Modified Barium Swallow  Study Patient Details Name: Eric [name] MRN: [id number] Date of Birth: [birth year] Today's Date: 09/15/2023 HPI/PMH: HPI: Eric [name] is a 89 y.o. male with medical history significant for COPD/chronic bronchitis, history of dysphagia with concern for aspiration (solid food per family), nocturnal hypoxia on 4 L nasal cannula nightly, ambulatory dysfunction with walker, hypothyroidism, permanent A-fib on Eliquis, hyperlipidemia, GERD, who presented with complaints of progressively worsening shortness of breath for the past few days.  Associated with generalized weakness.  Acute on chronic hypoxic respiratory failure suspect secondary to COPD and chronic diastolic CHF exacerbation. Clinical Impression: Clinical Impression: Pt demonstrates mild oropharyngeal dysphagia; mastication is inefficient but thorough. Family complains he eats too quickly; today his mastication was consistently complete, but he did swallow every two bites with vallecular packing. Pt initiated swallow sometimes at the pyriform sinuses allowing for instances of trace penetration of thin liquids. Pt sensed penetration if it descended close to the glottis. After starting solid trials pt had very hard coughing episodes there were unrelated to any airway invasion; Esophageal sweep showed diffuse residue and changes to esophageal tract due to curvature of spine. Suspect cough Lak[place] be related to esophageal stasis than any aspiration. Showed pt and family images and reinforced relatively good ariway protection and swallowing with mild residue. Emphasized benefit of enjoying meals for QOL. Offered HH SLP f/u for meal pacing. but pt said he really didnt want that and daughter was supportive. No SLP f/u needed at this time. Factors that Michelsen increase risk of adverse event in presence of aspiration [name] & [name] 2021): Factors that Geiler increase risk of adverse event in presence of aspiration [name] & [name] 2021): Frail or deconditioned; Respiratory or GI  disease Recommendations/Plan: Swallowing Evaluation Recommendations Swallowing Evaluation Recommendations Liquid Administration via: Cup; Straw Medication Administration: Whole meds with liquid Supervision: Patient able to self-feed Swallowing strategies  : Slow rate; Small bites/sips Postural changes: Stay upright 30-60 min after meals Treatment Plan Treatment Plan Treatment recommendations: No treatment recommended at this time Follow-up recommendations: No SLP follow up Recommendations Recommendations for follow up therapy are one component of a multi-disciplinary discharge planning process, led by the attending physician.  Recommendations Consolo be updated based on patient status, additional functional criteria and insurance authorization. Assessment: Orofacial Exam: No data recorded Anatomy: Anatomy: Presence of cervical hardware; Other (Comment) (posterior hardware severe kyphosis/lordosis) Boluses Administered: No data recorded Oral Impairment Domain: Oral Impairment Domain Lip Closure: No labial escape Tongue control during bolus hold: Cohesive bolus between tongue to palatal seal Bolus preparation/mastication: Slow prolonged chewing/mashing with complete recollection Bolus transport/lingual motion: Slow tongue motion Oral residue: Trace residue lining oral structures Location of oral residue : Tongue Initiation of pharyngeal swallow : Pyriform sinuses  Pharyngeal Impairment Domain: Pharyngeal Impairment Domain Soft palate elevation: No bolus between soft palate (SP)/pharyngeal wall (PW) Laryngeal elevation: Complete superior movement of thyroid cartilage with complete approximation of arytenoids to epiglottic petiole Anterior hyoid excursion: Complete anterior movement Epiglottic movement: Complete inversion Laryngeal vestibule closure: Complete, no air/contrast in laryngeal vestibule Pharyngeal  PROGRESS NOTE    Eric [name]  ION:[id number] DOB: February 05, 1932 DOA: 09/14/2023 PCP: Eric Seller, NP   Brief Narrative:  89 y.o. male with medical history significant for COPD/chronic bronchitis, history of dysphagia unspecified, nocturnal hypoxia on 4 L nasal cannula nightly, ambulatory dysfunction with walker, hypothyroidism, permanent A-fib on Eliquis, hyperlipidemia, GERD presented with worsening shortness of breath.  On presentation, patient had bilateral lower extremity edema and was tachypneic with WBC of 14.8, chest x-ray showing bibasilar opacities.  He was started on IV Solu-Medrol, antibiotics and Lasix.  Assessment & Plan:   Acute on chronic hypoxic respiratory failure -Possibly from COPD, multifocal pneumonia and CHF exacerbation -Normally uses 4 L oxygen via nasal cannula at night.  Currently requiring supplemental oxygen during daytime as well. -COVID-19/influenza/RSV PCR negative.  CT chest showed possible multifocal pneumonia.  Multifocal pneumonia with concern for aspiration -Continue Rocephin and Zithromax.    COPD exacerbation -Continue current nebs and inhaler treatment.  Continue Solu-Medrol.  CHF exacerbation -2D echo shows EF of 60 to 65%.  Has received 2 doses of IV Lasix since admission.  Strict input and output.  Daily weights.  Fluid restriction.   -Will give Lasix 40 mg IV every 12 hours if blood pressure allows.  History of dysphagia with concern for aspiration -Patient has had intermittent dysphagia with solid food -Diet as per SLP recommendations..  Leukocytosis -Monitor  Generalized weakness/ambulatory dysfunction -PT/OT recommend home and PT/OT  Permanent A-fib on Eliquis -Rate mostly controlled.  Continue Eliquis.  Outpatient follow-up with cardiology.  Continue metoprolol  Obesity -Outpatient follow-up  Hypothyroidism -Continue levothyroxine  Hyperlipidemia -Continue pravastatin  Peripheral neuropathy -Continue  gabapentin  GERD -Continue PPI   DVT prophylaxis: Eliquis Code Status: DNR Family Communication: None at bedside Disposition Plan: Status is: Inpatient Remains inpatient appropriate because: Of severity of illness    Consultants: None  Procedures: None  Antimicrobials: Rocephin and Zithromax from 09/14/2023 onwards   Subjective: Patient seen and examined at bedside.  Still complains of intermittent shortness of breath.  No fever, chest pain, vomiting reported. Objective: Vitals:   09/15/23 2124 09/16/23 0500 09/16/23 0639 09/16/23 0747  BP: 124/67  (!) 140/69 113/64  Pulse: 70  69 61  Resp: 20  17 19   Temp: (!) 97.4 F (36.3 C)  (!) 97.4 F (36.3 C) 97.8 F (36.6 C)  TempSrc: Oral  Oral Oral  SpO2: 96%  94% 93%  Weight:  86.9 kg    Height:        Intake/Output Summary (Last 24 hours) at 09/16/2023 0752 Last data filed at 09/16/2023 0602 Gross per 24 hour  Intake --  Output 1425 ml  Net -1425 ml   Filed Weights   09/14/23 1546 09/15/23 0339 09/16/23 0500  Weight: 87.1 kg 85.9 kg 86.9 kg    Examination:  General: On 4 L oxygen via nasal cannula.  No distress.  Elderly male lying in bed.  Chronically ill and deconditioned looking. ENT/neck: No thyromegaly.  JVD is not elevated  respiratory: Decreased breath sounds at bases bilaterally with some crackles; no wheezing  CVS: S1-S2 heard, rate controlled Abdominal: Soft, nontender, slightly distended; no organomegaly,  bowel sounds are heard Extremities: Trace lower extremity edema with chronic skin changes.; no cyanosis  CNS: Awake and alert.  Very slow to respond.  Poor historian.  No focal neurologic deficit.  Moves extremities Lymph: No obvious lymphadenopathy Skin: No obvious ecchymosis/lesions  psych: Mostly flat affect.  Not agitated currently.   Musculoskeletal: No  IMPLANT, BILATERAL    CERVICAL FUSION  02/10/2011  CHOLECYSTECTOMY  05/24/2018  CHOLECYSTECTOMY N/A 05/24/2018  Procedure: LAPAROSCOPIC CHOLECYSTECTOMY;  Surgeon: Abigail Miyamoto, MD;  Location: [facility] OR;  Service: General;   Laterality: N/A;  COLONOSCOPY WITH PROPOFOL N/A 02/20/2018  Procedure: COLONOSCOPY WITH PROPOFOL;  Surgeon: Midge Minium, MD;  Location: [facility] ENDOSCOPY;  Service: Endoscopy;  Laterality: N/A;  history of abd ultrasound  11/01  fatty liver  MULTIPLE TOOTH EXTRACTIONS    ORIF FIBULA FRACTURE Left 01/06/2017  Procedure: OPEN REDUCTION INTERNAL FIXATION (ORIF) FIBULA FRACTURE DISTAL FIBULA;  Surgeon: Marcene Corning, MD;  Location: [facility] OR;  Service: Orthopedics;  Laterality: Left;  Patient states has problems if he will have a tube in throat for Genera; Anesthesia DeBlois, [name] 09/15/2023, 1:51 PM  ECHOCARDIOGRAM COMPLETE  Result Date: 09/15/2023    ECHOCARDIOGRAM REPORT   Patient Name:   TOY EISEMANN Braud Date of Exam: 09/15/2023 Medical Rec #:  [id number]  Height:       66.0 in Accession #:    [id number] Weight:       189.4 lb Date of Birth:  10-28-1932   BSA:          1.954 m Patient Age:    89 years   BP:           107/54 mmHg Patient Gender: M          HR:           70 bpm. Exam Location:  Inpatient Procedure: 2D Echo, Color Doppler, Cardiac Doppler and Intracardiac            Opacification Agent Indications:    CHF  History:        Patient has prior history of Echocardiogram examinations, most                 recent 10/20/2017. CHF and Ascending Aortic Aneurysm, CKD,                 Arrythmias:Atrial Fibrillation; Risk Factors:Dyslipidemia and                 Hypertension.  Sonographer:    Melissa Morford RDCS (AE, PE) Referring Phys: [id number] CAROLE N HALL IMPRESSIONS  1. Left ventricular ejection fraction, by estimation, is 60 to 65%. The left ventricle has normal function. The left ventricle has no regional wall motion abnormalities. Left ventricular diastolic parameters are indeterminate.  2. Right ventricular systolic function is moderately reduced. The right ventricular size is moderately enlarged. There is mildly elevated pulmonary artery systolic pressure.  3. Left atrial size was severely dilated.  4. Right  atrial size was severely dilated.  5. The mitral valve is normal in structure. Mild mitral valve regurgitation. No evidence of mitral stenosis.  6. The aortic valve is normal in structure. Aortic valve regurgitation is trivial. No aortic stenosis is present.  7. The inferior vena cava is dilated in size with <50% respiratory variability, suggesting right atrial pressure of 15 mmHg. FINDINGS  Left Ventricle: Left ventricular ejection fraction, by estimation, is 60 to 65%. The left ventricle has normal function. The left ventricle has no regional wall motion abnormalities. Definity contrast agent was given IV to delineate the left ventricular  endocardial borders. The left ventricular internal cavity size was normal in size. There is no left ventricular hypertrophy. Left ventricular diastolic parameters are indeterminate. Right Ventricle: The right ventricular size is moderately enlarged. No increase in right ventricular wall thickness. Right ventricular systolic function is moderately  will remain in effect (meaning this test can be used) for the duration of the COVID-19 declaration under Section 564(b)(1) of the Act, 21 U.S.C. section 360bbb-3(b)(1), unless the authorization is terminated or revoked.  Performed at [facility], [address], [place], [place] [address]          Radiology Studies: DG Swallowing Func-Speech Pathology  Result Date: 09/15/2023 Table formatting from the original result was not included. Modified Barium Swallow  Study Patient Details Name: Eric [name] MRN: [id number] Date of Birth: [birth year] Today's Date: 09/15/2023 HPI/PMH: HPI: Eric [name] is a 89 y.o. male with medical history significant for COPD/chronic bronchitis, history of dysphagia with concern for aspiration (solid food per family), nocturnal hypoxia on 4 L nasal cannula nightly, ambulatory dysfunction with walker, hypothyroidism, permanent A-fib on Eliquis, hyperlipidemia, GERD, who presented with complaints of progressively worsening shortness of breath for the past few days.  Associated with generalized weakness.  Acute on chronic hypoxic respiratory failure suspect secondary to COPD and chronic diastolic CHF exacerbation. Clinical Impression: Clinical Impression: Pt demonstrates mild oropharyngeal dysphagia; mastication is inefficient but thorough. Family complains he eats too quickly; today his mastication was consistently complete, but he did swallow every two bites with vallecular packing. Pt initiated swallow sometimes at the pyriform sinuses allowing for instances of trace penetration of thin liquids. Pt sensed penetration if it descended close to the glottis. After starting solid trials pt had very hard coughing episodes there were unrelated to any airway invasion; Esophageal sweep showed diffuse residue and changes to esophageal tract due to curvature of spine. Suspect cough Lakatos be related to esophageal stasis than any aspiration. Showed pt and family images and reinforced relatively good ariway protection and swallowing with mild residue. Emphasized benefit of enjoying meals for QOL. Offered HH SLP f/u for meal pacing. but pt said he really didnt want that and daughter was supportive. No SLP f/u needed at this time. Factors that Michelsen increase risk of adverse event in presence of aspiration [name] & [name] 2021): Factors that Geiler increase risk of adverse event in presence of aspiration [name] & [name] 2021): Frail or deconditioned; Respiratory or GI  disease Recommendations/Plan: Swallowing Evaluation Recommendations Swallowing Evaluation Recommendations Liquid Administration via: Cup; Straw Medication Administration: Whole meds with liquid Supervision: Patient able to self-feed Swallowing strategies  : Slow rate; Small bites/sips Postural changes: Stay upright 30-60 min after meals Treatment Plan Treatment Plan Treatment recommendations: No treatment recommended at this time Follow-up recommendations: No SLP follow up Recommendations Recommendations for follow up therapy are one component of a multi-disciplinary discharge planning process, led by the attending physician.  Recommendations Consolo be updated based on patient status, additional functional criteria and insurance authorization. Assessment: Orofacial Exam: No data recorded Anatomy: Anatomy: Presence of cervical hardware; Other (Comment) (posterior hardware severe kyphosis/lordosis) Boluses Administered: No data recorded Oral Impairment Domain: Oral Impairment Domain Lip Closure: No labial escape Tongue control during bolus hold: Cohesive bolus between tongue to palatal seal Bolus preparation/mastication: Slow prolonged chewing/mashing with complete recollection Bolus transport/lingual motion: Slow tongue motion Oral residue: Trace residue lining oral structures Location of oral residue : Tongue Initiation of pharyngeal swallow : Pyriform sinuses  Pharyngeal Impairment Domain: Pharyngeal Impairment Domain Soft palate elevation: No bolus between soft palate (SP)/pharyngeal wall (PW) Laryngeal elevation: Complete superior movement of thyroid cartilage with complete approximation of arytenoids to epiglottic petiole Anterior hyoid excursion: Complete anterior movement Epiglottic movement: Complete inversion Laryngeal vestibule closure: Complete, no air/contrast in laryngeal vestibule Pharyngeal  will remain in effect (meaning this test can be used) for the duration of the COVID-19 declaration under Section 564(b)(1) of the Act, 21 U.S.C. section 360bbb-3(b)(1), unless the authorization is terminated or revoked.  Performed at [facility], [address], [place], [place] [address]          Radiology Studies: DG Swallowing Func-Speech Pathology  Result Date: 09/15/2023 Table formatting from the original result was not included. Modified Barium Swallow  Study Patient Details Name: Eric [name] MRN: [id number] Date of Birth: [birth year] Today's Date: 09/15/2023 HPI/PMH: HPI: Eric [name] is a 89 y.o. male with medical history significant for COPD/chronic bronchitis, history of dysphagia with concern for aspiration (solid food per family), nocturnal hypoxia on 4 L nasal cannula nightly, ambulatory dysfunction with walker, hypothyroidism, permanent A-fib on Eliquis, hyperlipidemia, GERD, who presented with complaints of progressively worsening shortness of breath for the past few days.  Associated with generalized weakness.  Acute on chronic hypoxic respiratory failure suspect secondary to COPD and chronic diastolic CHF exacerbation. Clinical Impression: Clinical Impression: Pt demonstrates mild oropharyngeal dysphagia; mastication is inefficient but thorough. Family complains he eats too quickly; today his mastication was consistently complete, but he did swallow every two bites with vallecular packing. Pt initiated swallow sometimes at the pyriform sinuses allowing for instances of trace penetration of thin liquids. Pt sensed penetration if it descended close to the glottis. After starting solid trials pt had very hard coughing episodes there were unrelated to any airway invasion; Esophageal sweep showed diffuse residue and changes to esophageal tract due to curvature of spine. Suspect cough Lakatos be related to esophageal stasis than any aspiration. Showed pt and family images and reinforced relatively good ariway protection and swallowing with mild residue. Emphasized benefit of enjoying meals for QOL. Offered HH SLP f/u for meal pacing. but pt said he really didnt want that and daughter was supportive. No SLP f/u needed at this time. Factors that Michelsen increase risk of adverse event in presence of aspiration [name] & [name] 2021): Factors that Geiler increase risk of adverse event in presence of aspiration [name] & [name] 2021): Frail or deconditioned; Respiratory or GI  disease Recommendations/Plan: Swallowing Evaluation Recommendations Swallowing Evaluation Recommendations Liquid Administration via: Cup; Straw Medication Administration: Whole meds with liquid Supervision: Patient able to self-feed Swallowing strategies  : Slow rate; Small bites/sips Postural changes: Stay upright 30-60 min after meals Treatment Plan Treatment Plan Treatment recommendations: No treatment recommended at this time Follow-up recommendations: No SLP follow up Recommendations Recommendations for follow up therapy are one component of a multi-disciplinary discharge planning process, led by the attending physician.  Recommendations Consolo be updated based on patient status, additional functional criteria and insurance authorization. Assessment: Orofacial Exam: No data recorded Anatomy: Anatomy: Presence of cervical hardware; Other (Comment) (posterior hardware severe kyphosis/lordosis) Boluses Administered: No data recorded Oral Impairment Domain: Oral Impairment Domain Lip Closure: No labial escape Tongue control during bolus hold: Cohesive bolus between tongue to palatal seal Bolus preparation/mastication: Slow prolonged chewing/mashing with complete recollection Bolus transport/lingual motion: Slow tongue motion Oral residue: Trace residue lining oral structures Location of oral residue : Tongue Initiation of pharyngeal swallow : Pyriform sinuses  Pharyngeal Impairment Domain: Pharyngeal Impairment Domain Soft palate elevation: No bolus between soft palate (SP)/pharyngeal wall (PW) Laryngeal elevation: Complete superior movement of thyroid cartilage with complete approximation of arytenoids to epiglottic petiole Anterior hyoid excursion: Complete anterior movement Epiglottic movement: Complete inversion Laryngeal vestibule closure: Complete, no air/contrast in laryngeal vestibule Pharyngeal  will remain in effect (meaning this test can be used) for the duration of the COVID-19 declaration under Section 564(b)(1) of the Act, 21 U.S.C. section 360bbb-3(b)(1), unless the authorization is terminated or revoked.  Performed at [facility], [address], [place], [place] [address]          Radiology Studies: DG Swallowing Func-Speech Pathology  Result Date: 09/15/2023 Table formatting from the original result was not included. Modified Barium Swallow  Study Patient Details Name: Eric [name] MRN: [id number] Date of Birth: [birth year] Today's Date: 09/15/2023 HPI/PMH: HPI: Eric [name] is a 89 y.o. male with medical history significant for COPD/chronic bronchitis, history of dysphagia with concern for aspiration (solid food per family), nocturnal hypoxia on 4 L nasal cannula nightly, ambulatory dysfunction with walker, hypothyroidism, permanent A-fib on Eliquis, hyperlipidemia, GERD, who presented with complaints of progressively worsening shortness of breath for the past few days.  Associated with generalized weakness.  Acute on chronic hypoxic respiratory failure suspect secondary to COPD and chronic diastolic CHF exacerbation. Clinical Impression: Clinical Impression: Pt demonstrates mild oropharyngeal dysphagia; mastication is inefficient but thorough. Family complains he eats too quickly; today his mastication was consistently complete, but he did swallow every two bites with vallecular packing. Pt initiated swallow sometimes at the pyriform sinuses allowing for instances of trace penetration of thin liquids. Pt sensed penetration if it descended close to the glottis. After starting solid trials pt had very hard coughing episodes there were unrelated to any airway invasion; Esophageal sweep showed diffuse residue and changes to esophageal tract due to curvature of spine. Suspect cough [place] be related to esophageal stasis than any aspiration. Showed pt and family images and reinforced relatively good ariway protection and swallowing with mild residue. Emphasized benefit of enjoying meals for QOL. Offered HH SLP f/u for meal pacing. but pt said he really didnt want that and daughter was supportive. No SLP f/u needed at this time. Factors that [place] increase risk of adverse event in presence of aspiration [name] & [name] 2021): Factors that [place] increase risk of adverse event in presence of aspiration [name] & [name] 2021): Frail or deconditioned; Respiratory or GI  disease Recommendations/Plan: Swallowing Evaluation Recommendations Swallowing Evaluation Recommendations Liquid Administration via: Cup; Straw Medication Administration: Whole meds with liquid Supervision: Patient able to self-feed Swallowing strategies  : Slow rate; Small bites/sips Postural changes: Stay upright 30-60 min after meals Treatment Plan Treatment Plan Treatment recommendations: No treatment recommended at this time Follow-up recommendations: No SLP follow up Recommendations Recommendations for follow up therapy are one component of a multi-disciplinary discharge planning process, led by the attending physician.  Recommendations [place] be updated based on patient status, additional functional criteria and insurance authorization. Assessment: Orofacial Exam: No data recorded Anatomy: Anatomy: Presence of cervical hardware; Other (Comment) (posterior hardware severe kyphosis/lordosis) Boluses Administered: No data recorded Oral Impairment Domain: Oral Impairment Domain Lip Closure: No labial escape Tongue control during bolus hold: Cohesive bolus between tongue to palatal seal Bolus preparation/mastication: Slow prolonged chewing/mashing with complete recollection Bolus transport/lingual motion: Slow tongue motion Oral residue: Trace residue lining oral structures Location of oral residue : Tongue Initiation of pharyngeal swallow : Pyriform sinuses  Pharyngeal Impairment Domain: Pharyngeal Impairment Domain Soft palate elevation: No bolus between soft palate (SP)/pharyngeal wall (PW) Laryngeal elevation: Complete superior movement of thyroid cartilage with complete approximation of arytenoids to epiglottic petiole Anterior hyoid excursion: Complete anterior movement Epiglottic movement: Complete inversion Laryngeal vestibule closure: Complete, no air/contrast in laryngeal vestibule Pharyngeal

## 2023-09-17 DIAGNOSIS — J9621 Acute and chronic respiratory failure with hypoxia: Secondary | ICD-10-CM | POA: Diagnosis not present

## 2023-09-17 LAB — BASIC METABOLIC PANEL
Anion gap: 11 (ref 5–15)
BUN: 33 mg/dL — ABNORMAL HIGH (ref 8–23)
CO2: 31 mmol/L (ref 22–32)
Calcium: 8.5 mg/dL — ABNORMAL LOW (ref 8.9–10.3)
Chloride: 98 mmol/L (ref 98–111)
Creatinine, Ser: 1.36 mg/dL — ABNORMAL HIGH (ref 0.61–1.24)
GFR, Estimated: 50 mL/min — ABNORMAL LOW (ref >60)
Glucose, Bld: 117 mg/dL — ABNORMAL HIGH (ref 70–99)
Potassium: 3.9 mmol/L (ref 3.5–5.1)
Sodium: 140 mmol/L (ref 135–145)

## 2023-09-17 LAB — MAGNESIUM: Magnesium: 2.2 mg/dL (ref 1.7–2.4)

## 2023-09-17 MED ORDER — METHYLPREDNISOLONE SODIUM SUCC 40 MG IJ SOLR
40.0000 mg | Freq: Every day | INTRAMUSCULAR | Status: DC
  Administered 2023-09-17 – 2023-09-19 (×3): 40 mg via INTRAVENOUS
  Filled 2023-09-17 (×3): qty 1

## 2023-09-17 MED ORDER — IPRATROPIUM-ALBUTEROL 0.5-2.5 (3) MG/3ML IN SOLN
3.0000 mL | Freq: Two times a day (BID) | RESPIRATORY_TRACT | Status: DC
  Administered 2023-09-18: 3 mL via RESPIRATORY_TRACT
  Filled 2023-09-17: qty 3

## 2023-09-17 NOTE — Plan of Care (Signed)

## 2023-09-17 NOTE — Progress Notes (Signed)
PROGRESS NOTE    Eric [name] Weight  UJW:[id number] DOB: Eric [name] 07, 1933 DOA: 09/14/2023 PCP: Eric [name]   Brief Narrative:  89 y.o. male with medical history significant for COPD/chronic bronchitis, history of dysphagia unspecified, nocturnal hypoxia on 4 L nasal cannula nightly, ambulatory dysfunction with walker, hypothyroidism, permanent A-fib on Eliquis, hyperlipidemia, GERD presented with worsening shortness of breath.  On presentation, patient had bilateral lower extremity edema and was tachypneic with WBC of 14.8, chest x-ray showing bibasilar opacities.  He was started on IV Solu-Medrol, antibiotics and Lasix.  Assessment & Plan:   Acute on chronic hypoxic respiratory failure -Possibly from COPD, multifocal pneumonia and CHF exacerbation -Normally uses 4 L oxygen via nasal cannula at night.  Currently requiring supplemental oxygen during daytime as well. -COVID-19/influenza/RSV PCR negative.  CT chest showed possible multifocal pneumonia.  Multifocal pneumonia with concern for aspiration -Continue Rocephin and Zithromax.    COPD exacerbation -Continue current nebs and inhaler treatment.  Continue Solu-Medrol.  CHF exacerbation -2D echo shows EF of 60 to 65%.  Strict input and output.  Daily weights.  Fluid restriction.   -Continue IV Lasix.  Patient has had good diuresis with negative balance of 4184.7 cc since admission.  History of dysphagia with concern for aspiration -Patient has had intermittent dysphagia with solid food -Diet as per SLP recommendations..  Leukocytosis -Monitor  Generalized weakness/ambulatory dysfunction -PT/OT recommend home and PT/OT  Permanent A-fib on Eliquis -Rate mostly controlled.  Continue Eliquis.  Outpatient follow-up with cardiology.  Continue metoprolol  Obesity -Outpatient follow-up  Hypothyroidism -Continue levothyroxine  Hyperlipidemia -Continue pravastatin  Peripheral neuropathy -Continue gabapentin  GERD -Continue  PPI   DVT prophylaxis: Eliquis Code Status: DNR Family Communication: None at bedside Disposition Plan: Status is: Inpatient Remains inpatient appropriate because: Of severity of illness    Consultants: None  Procedures: None  Antimicrobials: Rocephin and Zithromax from 09/14/2023 onwards   Subjective: Patient seen and examined at bedside.  Denies any chest pain, vomiting, abdominal pain.  Still short of breath with exertion but feels slightly better.  Not ready to go home today.   Objective: Vitals:   09/17/23 0018 09/17/23 0257 09/17/23 0351 09/17/23 0742  BP: (!) 128/90  127/64 (!) 146/84  Pulse: 64 (!) 50 68 60  Resp: 17 16 17    Temp: (!) 97.5 F (36.4 C)  97.6 F (36.4 C) (!) 97.5 F (36.4 C)  TempSrc: Oral  Axillary   SpO2: 98% 93% 96% 99%  Weight:   86.2 kg   Height:        Intake/Output Summary (Last 24 hours) at 09/17/2023 0759 Last data filed at 09/17/2023 0600 Gross per 24 hour  Intake 1150.31 ml  Output 4160 ml  Net -3009.69 ml   Filed Weights   09/15/23 0339 09/16/23 0500 09/17/23 0351  Weight: 85.9 kg 86.9 kg 86.2 kg    Examination:  General: No acute distress.  Remains on 4 L oxygen via nasal cannula.  Elderly male lying in bed.  Chronically ill and deconditioned looking. ENT/neck: No obvious JVD elevation or palpable neck masses noted respiratory: Bilateral decreased breath sounds at bases with scattered crackles CVS: Rate mostly controlled; S1 and S2 are heard Abdominal: Soft, nontender, distended mildly; no organomegaly,  bowel sounds are heard normally Extremities: No clubbing; bilateral lower extremity edema with chronic skin changes present CNS: Alert; slow to respond.  Poor historian.  No focal deficits noted.   Lymph: No obvious lymphadenopathy palpable Skin: No obvious petechia/lesions psych: Not  Use Authorization (EUA). This EUA will remain in effect (meaning this test can be used) for the duration of the COVID-19 declaration under Section 564(b)(1) of the Act, 21 U.S.C. section 360bbb-3(b)(1), unless the authorization is terminated or revoked.  Performed at [facility], [address] [place], [place] [address]          Radiology Studies: DG Swallowing Func-Speech Pathology  Result Date: 09/15/2023 Table formatting from the original result  was not included. Modified Barium Swallow Study Patient Details Name: Eric [name] MRN: [id number] Date of Birth: 09-Jul-1932 Today's Date: 09/15/2023 HPI/PMH: HPI: Eric [name] is a 89 y.o. male with medical history significant for COPD/chronic bronchitis, history of dysphagia with concern for aspiration (solid food per family), nocturnal hypoxia on 4 L nasal cannula nightly, ambulatory dysfunction with walker, hypothyroidism, permanent A-fib on Eliquis, hyperlipidemia, GERD, who presented with complaints of progressively worsening shortness of breath for the past few days.  Associated with generalized weakness.  Acute on chronic hypoxic respiratory failure suspect secondary to COPD and chronic diastolic CHF exacerbation. Clinical Impression: Clinical Impression: Pt demonstrates mild oropharyngeal dysphagia; mastication is inefficient but thorough. Family complains he eats too quickly; today his mastication was consistently complete, but he did swallow every two bites with vallecular packing. Pt initiated swallow sometimes at the pyriform sinuses allowing for instances of trace penetration of thin liquids. Pt sensed penetration if it descended close to the glottis. After starting solid trials pt had very hard coughing episodes there were unrelated to any airway invasion; Esophageal sweep showed diffuse residue and changes to esophageal tract due to curvature of spine. Suspect cough Klammer be related to esophageal stasis than any aspiration. Showed pt and family images and reinforced relatively good ariway protection and swallowing with mild residue. Emphasized benefit of enjoying meals for QOL. Offered HH SLP f/u for meal pacing. but pt said he really didnt want that and daughter was supportive. No SLP f/u needed at this time. Factors that Abrigo increase risk of adverse event in presence of aspiration [name] & [name] 2021): Factors that [place] increase risk of adverse event in presence of aspiration [name] & [name] 2021): Frail  or deconditioned; Respiratory or GI disease Recommendations/Plan: Swallowing Evaluation Recommendations Swallowing Evaluation Recommendations Liquid Administration via: Cup; Straw Medication Administration: Whole meds with liquid Supervision: Patient able to self-feed Swallowing strategies  : Slow rate; Small bites/sips Postural changes: Stay upright 30-60 min after meals Treatment Plan Treatment Plan Treatment recommendations: No treatment recommended at this time Follow-up recommendations: No SLP follow up Recommendations Recommendations for follow up therapy are one component of a multi-disciplinary discharge planning process, led by the attending physician.  Recommendations Hallstrom be updated based on patient status, additional functional criteria and insurance authorization. Assessment: Orofacial Exam: No data recorded Anatomy: Anatomy: Presence of cervical hardware; Other (Comment) (posterior hardware severe kyphosis/lordosis) Boluses Administered: No data recorded Oral Impairment Domain: Oral Impairment Domain Lip Closure: No labial escape Tongue control during bolus hold: Cohesive bolus between tongue to palatal seal Bolus preparation/mastication: Slow prolonged chewing/mashing with complete recollection Bolus transport/lingual motion: Slow tongue motion Oral residue: Trace residue lining oral structures Location of oral residue : Tongue Initiation of pharyngeal swallow : Pyriform sinuses  Pharyngeal Impairment Domain: Pharyngeal Impairment Domain Soft palate elevation: No bolus between soft palate (SP)/pharyngeal wall (PW) Laryngeal elevation: Complete superior movement of thyroid cartilage with complete approximation of arytenoids to epiglottic petiole Anterior hyoid excursion: Complete anterior movement Epiglottic movement: Complete inversion Laryngeal vestibule closure: Complete, no  EXTRACTION W/ INTRAOCULAR LENS  IMPLANT, BILATERAL    CERVICAL FUSION  02/10/2011  CHOLECYSTECTOMY  05/24/2018  CHOLECYSTECTOMY N/A 05/24/2018  Procedure: LAPAROSCOPIC CHOLECYSTECTOMY;  Surgeon: Abigail Miyamoto, MD;  Location:  [facility];  Service: General;  Laterality: N/A;  COLONOSCOPY WITH PROPOFOL N/A 02/20/2018  Procedure: COLONOSCOPY WITH PROPOFOL;  Surgeon: Midge Minium, MD;  Location: [facility] ENDOSCOPY;  Service: Endoscopy;  Laterality: N/A;  history of abd ultrasound  11/01  fatty liver  MULTIPLE TOOTH EXTRACTIONS    ORIF FIBULA FRACTURE Left 01/06/2017  Procedure: OPEN REDUCTION INTERNAL FIXATION (ORIF) FIBULA FRACTURE DISTAL FIBULA;  Surgeon: Marcene Corning, MD;  Location: [facility] OR;  Service: Orthopedics;  Laterality: Left;  Patient states has problems if he will have a tube in throat for Genera; Anesthesia DeBlois, [name] 09/15/2023, 1:51 PM  ECHOCARDIOGRAM COMPLETE  Result Date: 09/15/2023    ECHOCARDIOGRAM REPORT   Patient Name:   Eric [name] Date of Exam: 09/15/2023 Medical Rec #:  [id number]  Height:       66.0 in Accession #:    [id number] Weight:       189.4 lb Date of Birth:  Jan 06, 1932   BSA:          1.954 m Patient Age:    89 years   BP:           107/54 mmHg Patient Gender: M          HR:           70 bpm. Exam Location:  Inpatient Procedure: 2D Echo, Color Doppler, Cardiac Doppler and Intracardiac            Opacification Agent Indications:    CHF  History:        Patient has prior history of Echocardiogram examinations, most                 recent 10/20/2017. CHF and Ascending Aortic Aneurysm, CKD,                 Arrythmias:Atrial Fibrillation; Risk Factors:Dyslipidemia and                 Hypertension.  Sonographer:    Melissa Morford RDCS (AE, PE) Referring Phys: [id number] CAROLE N HALL IMPRESSIONS  1. Left ventricular ejection fraction, by estimation, is 60 to 65%. The left ventricle has normal function. The left ventricle has no regional wall motion abnormalities. Left ventricular diastolic parameters are indeterminate.  2. Right ventricular systolic function is moderately reduced. The right ventricular size is moderately enlarged. There is mildly elevated pulmonary artery systolic pressure.  3. Left atrial size was  severely dilated.  4. Right atrial size was severely dilated.  5. The mitral valve is normal in structure. Mild mitral valve regurgitation. No evidence of mitral stenosis.  6. The aortic valve is normal in structure. Aortic valve regurgitation is trivial. No aortic stenosis is present.  7. The inferior vena cava is dilated in size with <50% respiratory variability, suggesting right atrial pressure of 15 mmHg. FINDINGS  Left Ventricle: Left ventricular ejection fraction, by estimation, is 60 to 65%. The left ventricle has normal function. The left ventricle has no regional wall motion abnormalities. Definity contrast agent was given IV to delineate the left ventricular  endocardial borders. The left ventricular internal cavity size was normal in size. There is no left ventricular hypertrophy. Left ventricular diastolic parameters are indeterminate. Right Ventricle: The right ventricular size is moderately enlarged. No increase in right ventricular wall thickness. Right  EXTRACTION W/ INTRAOCULAR LENS  IMPLANT, BILATERAL    CERVICAL FUSION  02/10/2011  CHOLECYSTECTOMY  05/24/2018  CHOLECYSTECTOMY N/A 05/24/2018  Procedure: LAPAROSCOPIC CHOLECYSTECTOMY;  Surgeon: Abigail Miyamoto, MD;  Location:  [facility];  Service: General;  Laterality: N/A;  COLONOSCOPY WITH PROPOFOL N/A 02/20/2018  Procedure: COLONOSCOPY WITH PROPOFOL;  Surgeon: Midge Minium, MD;  Location: [facility] END[facility]COPY;  Service: Endoscopy;  Laterality: N/A;  history of abd ultrasound  11/01  fatty liver  MULTIPLE TOOTH EXTRACTIONS    ORIF FIBULA FRACTURE Left 01/06/2017  Procedure: OPEN REDUCTION INTERNAL FIXATION (ORIF) FIBULA FRACTURE DISTAL FIBULA;  Surgeon: Marcene Corning, MD;  Location: [facility] OR;  Service: Orthopedics;  Laterality: Left;  Patient states has problems if he will have a tube in throat for Genera; Anesthesia DeBlois, [name] 09/15/2023, 1:51 PM  ECHOCARDIOGRAM COMPLETE  Result Date: 09/15/2023    ECHOCARDIOGRAM REPORT   Patient Name:   Eric [name] Date of Exam: 09/15/2023 Medical Rec #:  [id number]  Height:       66.0 in Accession #:    [id number] Weight:       189.4 lb Date of Birth:  Jan 06, 1932   BSA:          1.954 m Patient Age:    89 years   BP:           107/54 mmHg Patient Gender: M          HR:           70 bpm. Exam Location:  Inpatient Procedure: 2D Echo, Color Doppler, Cardiac Doppler and Intracardiac            Opacification Agent Indications:    CHF  History:        Patient has prior history of Echocardiogram examinations, most                 recent 10/20/2017. CHF and Ascending Aortic Aneurysm, CKD,                 Arrythmias:Atrial Fibrillation; Risk Factors:Dyslipidemia and                 Hypertension.  Sonographer:    Melissa Morford RDCS (AE, PE) Referring Phys: [id number] CAROLE N HALL IMPRESSIONS  1. Left ventricular ejection fraction, by estimation, is 60 to 65%. The left ventricle has normal function. The left ventricle has no regional wall motion abnormalities. Left ventricular diastolic parameters are indeterminate.  2. Right ventricular systolic function is moderately reduced. The right ventricular size is moderately enlarged. There is mildly elevated pulmonary artery systolic pressure.  3. Left atrial size was  severely dilated.  4. Right atrial size was severely dilated.  5. The mitral valve is normal in structure. Mild mitral valve regurgitation. No evidence of mitral stenosis.  6. The aortic valve is normal in structure. Aortic valve regurgitation is trivial. No aortic stenosis is present.  7. The inferior vena cava is dilated in size with <50% respiratory variability, suggesting right atrial pressure of 15 mmHg. FINDINGS  Left Ventricle: Left ventricular ejection fraction, by estimation, is 60 to 65%. The left ventricle has normal function. The left ventricle has no regional wall motion abnormalities. Definity contrast agent was given IV to delineate the left ventricular  endocardial borders. The left ventricular internal cavity size was normal in size. There is no left ventricular hypertrophy. Left ventricular diastolic parameters are indeterminate. Right Ventricle: The right ventricular size is moderately enlarged. No increase in right ventricular wall thickness. Right  PROGRESS NOTE    Eric [name] Weight  UJW:[id number] DOB: Eric [name] 07, 1933 DOA: 09/14/2023 PCP: Eric [name]   Brief Narrative:  89 y.o. male with medical history significant for COPD/chronic bronchitis, history of dysphagia unspecified, nocturnal hypoxia on 4 L nasal cannula nightly, ambulatory dysfunction with walker, hypothyroidism, permanent A-fib on Eliquis, hyperlipidemia, GERD presented with worsening shortness of breath.  On presentation, patient had bilateral lower extremity edema and was tachypneic with WBC of 14.8, chest x-ray showing bibasilar opacities.  He was started on IV Solu-Medrol, antibiotics and Lasix.  Assessment & Plan:   Acute on chronic hypoxic respiratory failure -Possibly from COPD, multifocal pneumonia and CHF exacerbation -Normally uses 4 L oxygen via nasal cannula at night.  Currently requiring supplemental oxygen during daytime as well. -COVID-19/influenza/RSV PCR negative.  CT chest showed possible multifocal pneumonia.  Multifocal pneumonia with concern for aspiration -Continue Rocephin and Zithromax.    COPD exacerbation -Continue current nebs and inhaler treatment.  Continue Solu-Medrol.  CHF exacerbation -2D echo shows EF of 60 to 65%.  Strict input and output.  Daily weights.  Fluid restriction.   -Continue IV Lasix.  Patient has had good diuresis with negative balance of 4184.7 cc since admission.  History of dysphagia with concern for aspiration -Patient has had intermittent dysphagia with solid food -Diet as per SLP recommendations..  Leukocytosis -Monitor  Generalized weakness/ambulatory dysfunction -PT/OT recommend home and PT/OT  Permanent A-fib on Eliquis -Rate mostly controlled.  Continue Eliquis.  Outpatient follow-up with cardiology.  Continue metoprolol  Obesity -Outpatient follow-up  Hypothyroidism -Continue levothyroxine  Hyperlipidemia -Continue pravastatin  Peripheral neuropathy -Continue gabapentin  GERD -Continue  PPI   DVT prophylaxis: Eliquis Code Status: DNR Family Communication: None at bedside Disposition Plan: Status is: Inpatient Remains inpatient appropriate because: Of severity of illness    Consultants: None  Procedures: None  Antimicrobials: Rocephin and Zithromax from 09/14/2023 onwards   Subjective: Patient seen and examined at bedside.  Denies any chest pain, vomiting, abdominal pain.  Still short of breath with exertion but feels slightly better.  Not ready to go home today.   Objective: Vitals:   09/17/23 0018 09/17/23 0257 09/17/23 0351 09/17/23 0742  BP: (!) 128/90  127/64 (!) 146/84  Pulse: 64 (!) 50 68 60  Resp: 17 16 17    Temp: (!) 97.5 F (36.4 C)  97.6 F (36.4 C) (!) 97.5 F (36.4 C)  TempSrc: Oral  Axillary   SpO2: 98% 93% 96% 99%  Weight:   86.2 kg   Height:        Intake/Output Summary (Last 24 hours) at 09/17/2023 0759 Last data filed at 09/17/2023 0600 Gross per 24 hour  Intake 1150.31 ml  Output 4160 ml  Net -3009.69 ml   Filed Weights   09/15/23 0339 09/16/23 0500 09/17/23 0351  Weight: 85.9 kg 86.9 kg 86.2 kg    Examination:  General: No acute distress.  Remains on 4 L oxygen via nasal cannula.  Elderly male lying in bed.  Chronically ill and deconditioned looking. ENT/neck: No obvious JVD elevation or palpable neck masses noted respiratory: Bilateral decreased breath sounds at bases with scattered crackles CVS: Rate mostly controlled; S1 and S2 are heard Abdominal: Soft, nontender, distended mildly; no organomegaly,  bowel sounds are heard normally Extremities: No clubbing; bilateral lower extremity edema with chronic skin changes present CNS: Alert; slow to respond.  Poor historian.  No focal deficits noted.   Lymph: No obvious lymphadenopathy palpable Skin: No obvious petechia/lesions psych: Not  PROGRESS NOTE    Eric [name] Weight  UJW:[id number] DOB: Eric [name] 07, 1933 DOA: 09/14/2023 PCP: Eric [name]   Brief Narrative:  89 y.o. male with medical history significant for COPD/chronic bronchitis, history of dysphagia unspecified, nocturnal hypoxia on 4 L nasal cannula nightly, ambulatory dysfunction with walker, hypothyroidism, permanent A-fib on Eliquis, hyperlipidemia, GERD presented with worsening shortness of breath.  On presentation, patient had bilateral lower extremity edema and was tachypneic with WBC of 14.8, chest x-ray showing bibasilar opacities.  He was started on IV Solu-Medrol, antibiotics and Lasix.  Assessment & Plan:   Acute on chronic hypoxic respiratory failure -Possibly from COPD, multifocal pneumonia and CHF exacerbation -Normally uses 4 L oxygen via nasal cannula at night.  Currently requiring supplemental oxygen during daytime as well. -COVID-19/influenza/RSV PCR negative.  CT chest showed possible multifocal pneumonia.  Multifocal pneumonia with concern for aspiration -Continue Rocephin and Zithromax.    COPD exacerbation -Continue current nebs and inhaler treatment.  Continue Solu-Medrol.  CHF exacerbation -2D echo shows EF of 60 to 65%.  Strict input and output.  Daily weights.  Fluid restriction.   -Continue IV Lasix.  Patient has had good diuresis with negative balance of 4184.7 cc since admission.  History of dysphagia with concern for aspiration -Patient has had intermittent dysphagia with solid food -Diet as per SLP recommendations..  Leukocytosis -Monitor  Generalized weakness/ambulatory dysfunction -PT/OT recommend home and PT/OT  Permanent A-fib on Eliquis -Rate mostly controlled.  Continue Eliquis.  Outpatient follow-up with cardiology.  Continue metoprolol  Obesity -Outpatient follow-up  Hypothyroidism -Continue levothyroxine  Hyperlipidemia -Continue pravastatin  Peripheral neuropathy -Continue gabapentin  GERD -Continue  PPI   DVT prophylaxis: Eliquis Code Status: DNR Family Communication: None at bedside Disposition Plan: Status is: Inpatient Remains inpatient appropriate because: Of severity of illness    Consultants: None  Procedures: None  Antimicrobials: Rocephin and Zithromax from 09/14/2023 onwards   Subjective: Patient seen and examined at bedside.  Denies any chest pain, vomiting, abdominal pain.  Still short of breath with exertion but feels slightly better.  Not ready to go home today.   Objective: Vitals:   09/17/23 0018 09/17/23 0257 09/17/23 0351 09/17/23 0742  BP: (!) 128/90  127/64 (!) 146/84  Pulse: 64 (!) 50 68 60  Resp: 17 16 17    Temp: (!) 97.5 F (36.4 C)  97.6 F (36.4 C) (!) 97.5 F (36.4 C)  TempSrc: Oral  Axillary   SpO2: 98% 93% 96% 99%  Weight:   86.2 kg   Height:        Intake/Output Summary (Last 24 hours) at 09/17/2023 0759 Last data filed at 09/17/2023 0600 Gross per 24 hour  Intake 1150.31 ml  Output 4160 ml  Net -3009.69 ml   Filed Weights   09/15/23 0339 09/16/23 0500 09/17/23 0351  Weight: 85.9 kg 86.9 kg 86.2 kg    Examination:  General: No acute distress.  Remains on 4 L oxygen via nasal cannula.  Elderly male lying in bed.  Chronically ill and deconditioned looking. ENT/neck: No obvious JVD elevation or palpable neck masses noted respiratory: Bilateral decreased breath sounds at bases with scattered crackles CVS: Rate mostly controlled; S1 and S2 are heard Abdominal: Soft, nontender, distended mildly; no organomegaly,  bowel sounds are heard normally Extremities: No clubbing; bilateral lower extremity edema with chronic skin changes present CNS: Alert; slow to respond.  Poor historian.  No focal deficits noted.   Lymph: No obvious lymphadenopathy palpable Skin: No obvious petechia/lesions psych: Not  PROGRESS NOTE    Eric [name] Weight  UJW:[id number] DOB: Eric [name] 07, 1933 DOA: 09/14/2023 PCP: Eric [name]   Brief Narrative:  89 y.o. male with medical history significant for COPD/chronic bronchitis, history of dysphagia unspecified, nocturnal hypoxia on 4 L nasal cannula nightly, ambulatory dysfunction with walker, hypothyroidism, permanent A-fib on Eliquis, hyperlipidemia, GERD presented with worsening shortness of breath.  On presentation, patient had bilateral lower extremity edema and was tachypneic with WBC of 14.8, chest x-ray showing bibasilar opacities.  He was started on IV Solu-Medrol, antibiotics and Lasix.  Assessment & Plan:   Acute on chronic hypoxic respiratory failure -Possibly from COPD, multifocal pneumonia and CHF exacerbation -Normally uses 4 L oxygen via nasal cannula at night.  Currently requiring supplemental oxygen during daytime as well. -COVID-19/influenza/RSV PCR negative.  CT chest showed possible multifocal pneumonia.  Multifocal pneumonia with concern for aspiration -Continue Rocephin and Zithromax.    COPD exacerbation -Continue current nebs and inhaler treatment.  Continue Solu-Medrol.  CHF exacerbation -2D echo shows EF of 60 to 65%.  Strict input and output.  Daily weights.  Fluid restriction.   -Continue IV Lasix.  Patient has had good diuresis with negative balance of 4184.7 cc since admission.  History of dysphagia with concern for aspiration -Patient has had intermittent dysphagia with solid food -Diet as per SLP recommendations..  Leukocytosis -Monitor  Generalized weakness/ambulatory dysfunction -PT/OT recommend home and PT/OT  Permanent A-fib on Eliquis -Rate mostly controlled.  Continue Eliquis.  Outpatient follow-up with cardiology.  Continue metoprolol  Obesity -Outpatient follow-up  Hypothyroidism -Continue levothyroxine  Hyperlipidemia -Continue pravastatin  Peripheral neuropathy -Continue gabapentin  GERD -Continue  PPI   DVT prophylaxis: Eliquis Code Status: DNR Family Communication: None at bedside Disposition Plan: Status is: Inpatient Remains inpatient appropriate because: Of severity of illness    Consultants: None  Procedures: None  Antimicrobials: Rocephin and Zithromax from 09/14/2023 onwards   Subjective: Patient seen and examined at bedside.  Denies any chest pain, vomiting, abdominal pain.  Still short of breath with exertion but feels slightly better.  Not ready to go home today.   Objective: Vitals:   09/17/23 0018 09/17/23 0257 09/17/23 0351 09/17/23 0742  BP: (!) 128/90  127/64 (!) 146/84  Pulse: 64 (!) 50 68 60  Resp: 17 16 17    Temp: (!) 97.5 F (36.4 C)  97.6 F (36.4 C) (!) 97.5 F (36.4 C)  TempSrc: Oral  Axillary   SpO2: 98% 93% 96% 99%  Weight:   86.2 kg   Height:        Intake/Output Summary (Last 24 hours) at 09/17/2023 0759 Last data filed at 09/17/2023 0600 Gross per 24 hour  Intake 1150.31 ml  Output 4160 ml  Net -3009.69 ml   Filed Weights   09/15/23 0339 09/16/23 0500 09/17/23 0351  Weight: 85.9 kg 86.9 kg 86.2 kg    Examination:  General: No acute distress.  Remains on 4 L oxygen via nasal cannula.  Elderly male lying in bed.  Chronically ill and deconditioned looking. ENT/neck: No obvious JVD elevation or palpable neck masses noted respiratory: Bilateral decreased breath sounds at bases with scattered crackles CVS: Rate mostly controlled; S1 and S2 are heard Abdominal: Soft, nontender, distended mildly; no organomegaly,  bowel sounds are heard normally Extremities: No clubbing; bilateral lower extremity edema with chronic skin changes present CNS: Alert; slow to respond.  Poor historian.  No focal deficits noted.   Lymph: No obvious lymphadenopathy palpable Skin: No obvious petechia/lesions psych: Not

## 2023-09-18 DIAGNOSIS — J9621 Acute and chronic respiratory failure with hypoxia: Secondary | ICD-10-CM | POA: Diagnosis not present

## 2023-09-18 LAB — BASIC METABOLIC PANEL
Anion gap: 13 (ref 5–15)
BUN: 34 mg/dL — ABNORMAL HIGH (ref 8–23)
CO2: 28 mmol/L (ref 22–32)
Calcium: 8.7 mg/dL — ABNORMAL LOW (ref 8.9–10.3)
Chloride: 95 mmol/L — ABNORMAL LOW (ref 98–111)
Creatinine, Ser: 1.36 mg/dL — ABNORMAL HIGH (ref 0.61–1.24)
GFR, Estimated: 50 mL/min — ABNORMAL LOW (ref >60)
Glucose, Bld: 156 mg/dL — ABNORMAL HIGH (ref 70–99)
Potassium: 4 mmol/L (ref 3.5–5.1)
Sodium: 136 mmol/L (ref 135–145)

## 2023-09-18 LAB — CBC WITH DIFFERENTIAL/PLATELET
Abs Immature Granulocytes: 0.05 10*3/uL (ref 0.00–0.07)
Basophils Absolute: 0 10*3/uL (ref 0.0–0.1)
Basophils Relative: 0 %
Eosinophils Absolute: 0 10*3/uL (ref 0.0–0.5)
Eosinophils Relative: 0 %
HCT: 44.7 % (ref 39.0–52.0)
Hemoglobin: 14.3 g/dL (ref 13.0–17.0)
Immature Granulocytes: 1 %
Lymphocytes Relative: 17 %
Lymphs Abs: 1.7 10*3/uL (ref 0.7–4.0)
MCH: 31.4 pg (ref 26.0–34.0)
MCHC: 32 g/dL (ref 30.0–36.0)
MCV: 98.2 fL (ref 80.0–100.0)
Monocytes Absolute: 0.5 10*3/uL (ref 0.1–1.0)
Monocytes Relative: 5 %
Neutro Abs: 7.8 10*3/uL — ABNORMAL HIGH (ref 1.7–7.7)
Neutrophils Relative %: 77 %
Platelets: 219 10*3/uL (ref 150–400)
RBC: 4.55 MIL/uL (ref 4.22–5.81)
RDW: 13.4 % (ref 11.5–15.5)
WBC: 10 10*3/uL (ref 4.0–10.5)
nRBC: 0 % (ref 0.0–0.2)

## 2023-09-18 LAB — MAGNESIUM: Magnesium: 2.3 mg/dL (ref 1.7–2.4)

## 2023-09-18 NOTE — Progress Notes (Signed)
Occupational Therapy Treatment Patient Details Name: Eric [name] MRN: [id number] DOB: [birth year] Today's Date: 09/18/2023   History of present illness Pt is an 89 y/o M admitted on 09/14/23 after presenting with c/o progressively worsening SOB for the past few days. Pt is being treated for acute on chronic hypoxic respiratory failure 2/2 COPD & chronic diastolic CHF exacerbation. PMH: COPD, chronic bronchitis, dysphagia, nocturnal hypoxia on 4L O2, hypothyroidism, permanent a-fib on eliquis, HLD, GERD, anxiety, cervical DDD, depressive disorder   OT comments  Pt currently at min guard assist for mobility with use of the RW during toilet transfers and ambulation.  Mod instructional cueing for upright posture and for staying closer in the RW.  Oxygen sats maintained at 90% or greater on room air with mobility and were at 100% on 4 Ls nasal cannula at rest.  Feel pt will continue to benefit from acute care OT at this time in order to increase ADL independence for return home with his spouse.  Recommend HHOT for continued progression at discharge.        If plan is discharge home, recommend the following:  A little help with walking and/or transfers;A lot of help with bathing/dressing/bathroom;Assistance with cooking/housework;Help with stairs or ramp for entrance;Assist for transportation   Equipment Recommendations  None recommended by OT       Precautions / Restrictions Precautions Precautions: Fall Restrictions Weight Bearing Restrictions: No       Mobility Bed Mobility                    Transfers Overall transfer level: Needs assistance Equipment used: Rolling walker (2 wheels) Transfers: Sit to/from Stand, Bed to chair/wheelchair/BSC Sit to Stand: Contact guard assist     Step pivot transfers: Contact guard assist           Balance Overall balance assessment: Needs assistance Sitting-balance support: Feet supported Sitting balance-Leahy Scale: Fair     Standing  balance support: During functional activity, Bilateral upper extremity supported, Reliant on assistive device for balance Standing balance-Leahy Scale: Poor Standing balance comment: Pt needs UE support for mobility with use of the RW for support.                           ADL either performed or assessed with clinical judgement   ADL Overall ADL's : Needs assistance/impaired                         Toilet Transfer: Contact guard assist;Regular Toilet;Grab bars;Rolling walker (2 wheels) Toilet Transfer Details (indicate cue type and reason): ambulation with RW         Functional mobility during ADLs: Contact guard assist;Rolling walker (2 wheels) General ADL Comments: Pt declined need to toilet but practiced toilet transfers with use of the RW.  Pt reports having a toilet riser if needed at home but generally he can get up from his higher toilet.  Oxygen sats on 4Ls nasal cannula were at 100%.  They decreased to 90% with activity on room air but did not drop lower.      Cognition Arousal: Alert Behavior During Therapy: WFL for tasks assessed/performed Overall Cognitive Status: Within Functional Limits for tasks assessed  Pertinent Vitals/ Pain       Pain Assessment Pain Assessment: No/denies pain         Frequency  Min 1X/week        Progress Toward Goals  OT Goals(current goals can now be found in the care plan section)  Progress towards OT goals: Progressing toward goals  Acute Rehab OT Goals OT Goal Formulation: With patient Time For Goal Achievement: 09/29/23 Potential to Achieve Goals: Good  Plan         AM-PAC OT "6 Clicks" Daily Activity     Outcome Measure   Help from another person eating meals?: None Help from another person taking care of personal grooming?: A Little Help from another person toileting, which includes using toliet, bedpan, or urinal?: A  Little Help from another person bathing (including washing, rinsing, drying)?: A Lot Help from another person to put on and taking off regular upper body clothing?: A Little Help from another person to put on and taking off regular lower body clothing?: A Lot 6 Click Score: 17    End of Session Equipment Utilized During Treatment: Gait belt;Rolling walker (2 wheels)  OT Visit Diagnosis: Unsteadiness on feet (R26.81);Other abnormalities of gait and mobility (R26.89);Muscle weakness (generalized) (M62.81)   Activity Tolerance Patient tolerated treatment well   Patient Left in chair;with call bell/phone within reach;with chair alarm set   Nurse Communication Mobility status;Other (comment)        Time: 2440-1027 OT Time Calculation (min): 36 min  Charges: OT General Charges $OT Visit: 1 Visit OT Treatments $Self Care/Home Management : 23-37 mins  Perrin Maltese, OTR/L Acute Rehabilitation Services  Office [phone] 09/18/2023

## 2023-09-18 NOTE — Care Management Important Message (Signed)
Important Message  Patient Details  Name: Eric [name] MRN: [id number] Date of Birth: 08-17-1932   Important Message Given:  Yes - Medicare IM     Dorena Bodo 09/18/2023, 2:04 PM

## 2023-09-18 NOTE — Discharge Instructions (Signed)
Information on my medicine - ELIQUIS® (apixaban) ° °This medication education was reviewed with me or my healthcare representative as part of my discharge preparation. ° °Why was Eliquis® prescribed for you? °Eliquis® was prescribed for you to reduce the risk of a blood clot forming that can cause a stroke if you have a medical condition called atrial fibrillation (a type of irregular heartbeat). ° °What do You need to know about Eliquis® ? °Take your Eliquis® TWICE DAILY - one tablet in the morning and one tablet in the evening with or without food. If you have difficulty swallowing the tablet whole please discuss with your pharmacist how to take the medication safely. ° °Take Eliquis® exactly as prescribed by your doctor and DO NOT stop taking Eliquis® without talking to the doctor who prescribed the medication.  Stopping  increase your risk of developing a stroke.  Refill your prescription before you run out. ° °After discharge, you should have regular check-up appointments with your healthcare provider that is prescribing your Eliquis®.  In the future your dose  need to be changed if your kidney function or weight changes by a significant amount or as you get older. ° °What do you do if you miss a dose? °If you miss a dose, take it as soon as you remember on the same day and resume taking twice daily.  Do not take more than one dose of ELIQUIS at the same time to make up a missed dose. ° °Important Safety Information °A possible side effect of Eliquis® is bleeding. You should call your healthcare provider right away if you experience any of the following: °? Bleeding from an injury or your nose that does not stop. °? Unusual colored urine (red or dark brown) or unusual colored stools (red or black). °? Unusual bruising for unknown reasons. °? A serious fall or if you hit your head (even if there is no bleeding). ° °Some medicines  interact with Eliquis® and might increase your risk of bleeding or  clotting while on Eliquis®. To help avoid this, consult your healthcare provider or pharmacist prior to using any new prescription or non-prescription medications, including herbals, vitamins, non-steroidal anti-inflammatory drugs (NSAIDs) and supplements. ° °This website has more information on Eliquis® (apixaban): http://www.eliquis.com/eliquis/home ° °

## 2023-09-18 NOTE — Progress Notes (Signed)
Physical Therapy Treatment Patient Details Name: Eric [name] MRN: [id number] DOB: [birth year] Today's Date: 09/18/2023   History of Present Illness Pt is an 89 y/o M admitted on 09/14/23 after presenting with c/o progressively worsening SOB for the past few days. Pt is being treated for acute on chronic hypoxic respiratory failure 2/2 COPD & chronic diastolic CHF exacerbation. PMH: COPD, chronic bronchitis, dysphagia, nocturnal hypoxia on 4L O2, hypothyroidism, permanent a-fib on eliquis, HLD, GERD, anxiety, cervical DDD, depressive disorder    PT Comments  Pt greeted up in chair on arrival and agreeable to session with good progress towards acute goals. Pt able to progress gait distance with grossly CGA for safety and RW for support. Pt hyper verbose throughout mobility with SpO2 dropping to 89% on RA at end of ambulation distance, pt unable to recover with breathing techniques, supplemental O2 reapplied with SpO2 increasing to >94%. Pt continues to require cues throughout gait for closer RW proximity and upright posture. Current plan remains appropriate to address deficits and maximize functional independence and decrease caregiver burden. Pt continues to benefit from skilled PT services to progress toward functional mobility goals.     If plan is discharge home, recommend the following: A little help with bathing/dressing/bathroom;A little help with walking and/or transfers;Assistance with cooking/housework;Assist for transportation;Help with stairs or ramp for entrance   Can travel by private vehicle        Equipment Recommendations  None recommended by PT    Recommendations for Other Services       Precautions / Restrictions Precautions Precautions: Fall Restrictions Weight Bearing Restrictions: No     Mobility  Bed Mobility Overal bed mobility: Needs Assistance             General bed mobility comments: pt up in chair on arrival    Transfers Overall transfer level: Needs  assistance Equipment used: Rolling walker (2 wheels) Transfers: Sit to/from Stand, Bed to chair/wheelchair/BSC Sit to Stand: Contact guard assist           General transfer comment: CGA for safety, good hand placement and eccentric control when coming to sit    Ambulation/Gait Ambulation/Gait assistance: Contact guard assist Gait Distance (Feet): 150 Feet Assistive device: Rolling walker (2 wheels) Gait Pattern/deviations: Step-through pattern, Decreased step length - left, Decreased step length - right, Decreased stride length, Decreased dorsiflexion - right, Decreased dorsiflexion - left, Trunk flexed Gait velocity: decreased     General Gait Details: continued cues for body position inside base of RW   Stairs             Wheelchair Mobility     Tilt Bed    Modified Rankin (Stroke Patients Only)       Balance Overall balance assessment: Needs assistance Sitting-balance support: Feet supported Sitting balance-Leahy Scale: Fair     Standing balance support: During functional activity, Bilateral upper extremity supported, Reliant on assistive device for balance Standing balance-Leahy Scale: Poor Standing balance comment: Pt needs UE support for mobility with use of the RW for support.                            Cognition Arousal: Alert Behavior During Therapy: WFL for tasks assessed/performed Overall Cognitive Status: Within Functional Limits for tasks assessed                                 General  Comments: verbose        Exercises      General Comments General comments (skin integrity, edema, etc.): Pt requesting to ambulate without supplemental O2 as pt does not wear at baseline, SpO2 dropping to 89% on RA, replaced supplemental O2 at end of session with SpO2 improving to 94%      Pertinent Vitals/Pain Pain Assessment Pain Assessment: No/denies pain Pain Intervention(s): Monitored during session    Home Living                           Prior Function            PT Goals (current goals can now be found in the care plan section) Acute Rehab PT Goals Patient Stated Goal: get better PT Goal Formulation: With patient Time For Goal Achievement: 09/29/23 Progress towards PT goals: Progressing toward goals    Frequency    Min 1X/week      PT Plan      Co-evaluation              AM-PAC PT "6 Clicks" Mobility   Outcome Measure  Help needed turning from your back to your side while in a flat bed without using bedrails?: A Little Help needed moving from lying on your back to sitting on the side of a flat bed without using bedrails?: A Lot Help needed moving to and from a bed to a chair (including a wheelchair)?: A Little Help needed standing up from a chair using your arms (e.g., wheelchair or bedside chair)?: A Little Help needed to walk in hospital room?: A Little Help needed climbing 3-5 steps with a railing? : A Lot 6 Click Score: 16    End of Session Equipment Utilized During Treatment: Oxygen Activity Tolerance: Patient tolerated treatment well Patient left: in chair;with chair alarm set;with call bell/phone within reach Nurse Communication: Mobility status PT Visit Diagnosis: Muscle weakness (generalized) (M62.81);Difficulty in walking, not elsewhere classified (R26.2)     Time: 8295-6213 PT Time Calculation (min) (ACUTE ONLY): 31 min  Charges:    $Gait Training: 23-37 mins PT General Charges $$ ACUTE PT VISIT: 1 Visit                     Eric [name] R. PTA Acute Rehabilitation Services Office: [phone]   Eric [name] 09/18/2023, 2:55 PM

## 2023-09-18 NOTE — Care Management Important Message (Signed)
Important Message  Patient Details  Name: Eric [name] MRN: [id number] Date of Birth: 1932/09/29   Important Message Given:  Yes - Medicare IM     Dorena Bodo 09/18/2023, 2:10 PM

## 2023-09-18 NOTE — Plan of Care (Signed)

## 2023-09-18 NOTE — Progress Notes (Signed)
PROGRESS NOTE    Eric [name]  NWG:[id number] DOB: [birth year] DOA: 09/14/2023 PCP: Sharon Seller, NP   Brief Narrative:  89 y.o. male with medical history significant for COPD/chronic bronchitis, history of dysphagia unspecified, nocturnal hypoxia on 4 L nasal cannula nightly, ambulatory dysfunction with walker, hypothyroidism, permanent A-fib on Eliquis, hyperlipidemia, GERD presented with worsening shortness of breath.  On presentation, patient had bilateral lower extremity edema and was tachypneic with WBC of 14.8, chest x-ray showing bibasilar opacities.  He was started on IV Solu-Medrol, antibiotics and Lasix.  Assessment & Plan:   Acute on chronic hypoxic respiratory failure -Possibly from COPD, multifocal pneumonia and CHF exacerbation -Normally uses 4 L oxygen via nasal cannula at night.  Currently requiring supplemental oxygen during daytime as well. -COVID-19/influenza/RSV PCR negative.  CT chest showed possible multifocal pneumonia.  Multifocal pneumonia with concern for aspiration -Continue Rocephin and Zithromax.  Finish 5 to 7-day course of therapy  COPD exacerbation -Continue current nebs and inhaler treatment.  Continue Solu-Medrol.  CHF exacerbation -2D echo shows EF of 60 to 65%.  Strict input and output.  Daily weights.  Fluid restriction.   -Continue IV Lasix.  Patient has had good diuresis with negative balance of 6344.7 cc since admission.  Will switch to oral Lasix in 1 to 2 days.  History of dysphagia with concern for aspiration -Patient has had intermittent dysphagia with solid food -Diet as per SLP recommendations..  Leukocytosis -Monitor.  Labs pending for today.  Generalized weakness/ambulatory dysfunction -PT/OT recommend home and PT/OT  Permanent A-fib on Eliquis -Rate mostly controlled.  Continue Eliquis.  Outpatient follow-up with cardiology.  Continue metoprolol  Obesity -Outpatient follow-up  Hypothyroidism -Continue  levothyroxine  Hyperlipidemia -Continue pravastatin  Peripheral neuropathy -Continue gabapentin  GERD -Continue PPI   DVT prophylaxis: Eliquis Code Status: DNR Family Communication: None at bedside Disposition Plan: Status is: Inpatient Remains inpatient appropriate because: Of severity of illness    Consultants: None  Procedures: None  Antimicrobials: Rocephin and Zithromax from 09/14/2023 onwards   Subjective: Patient seen and examined at bedside.  No fever, vomiting, chest pain reported.  Still short of breath with exertion.  Not ready for discharge yet.   Objective: Vitals:   09/18/23 0142 09/18/23 0414 09/18/23 0558 09/18/23 0731  BP: (!) 143/75  (!) 145/88   Pulse: (!) 59  62 62  Resp: 17  18 18   Temp: (!) 97.4 F (36.3 C)  (!) 96.5 F (35.8 C)   TempSrc: Axillary  Axillary   SpO2: 98%  100% 100%  Weight:  84.4 kg    Height:        Intake/Output Summary (Last 24 hours) at 09/18/2023 0742 Last data filed at 09/18/2023 5784 Gross per 24 hour  Intake 340 ml  Output 2500 ml  Net -2160 ml   Filed Weights   09/16/23 0500 09/17/23 0351 09/18/23 0414  Weight: 86.9 kg 86.2 kg 84.4 kg    Examination:  General: On 4 L oxygen by nasal cannula.  No distress.  Elderly male lying in bed.  Chronically ill and deconditioned looking. ENT/neck: No JVD elevation or palpable thyromegaly noted  respiratory: Decreased breath sounds at bases bilaterally with some crackles and intermittent tachypnea  CVS: S1-S2 heard; mild intermittent bradycardia present Abdominal: Soft, nontender, slightly distended; no organomegaly, normal bowel sounds heard  extremities: Lower extremity edema present bilaterally with chronic skin changes present.  No cyanosis CNS: Still slow to respond; awake but poor historian.  No obvious focal  deficits noted.   Lymph: No palpable lymphadenopathy noted Skin: No obvious ecchymosis/rashes psych: Not agitated currently.  Affect is mostly flat   musculoskeletal: No obvious joint swelling/tenderness  Data Reviewed: I have personally reviewed following labs and imaging studies  CBC: Recent Labs  Lab 09/14/23 1607 09/15/23 0626 09/16/23 0537  WBC 14.8* 13.1* 14.2*  NEUTROABS 12.1*  --  12.7*  HGB 13.7 13.8 12.6*  HCT 43.2 43.0 39.9  MCV 96.6 98.6 97.3  PLT 164 161 150   Basic Metabolic Panel: Recent Labs  Lab 09/14/23 1707 09/15/23 0626 09/16/23 0537 09/17/23 0650  NA 140 137 137 140  K 4.1 4.1 4.2 3.9  CL 104 102 101 98  CO2 23 24 27 31   GLUCOSE 139* 164* 143* 117*  BUN 25* 28* 35* 33*  CREATININE 1.12 1.25* 1.27* 1.36*  CALCIUM 8.9 8.5* 8.6* 8.5*  MG  --  2.0 2.2 2.2  PHOS  --  2.4*  --   --    GFR: Estimated Creatinine Clearance: 37.5 mL/min (A) (by C-G formula based on SCr of 1.36 mg/dL (H)). Liver Function Tests: Recent Labs  Lab 09/14/23 1707  AST 40  ALT 40  ALKPHOS 47  BILITOT 1.7*  PROT 6.9  ALBUMIN 3.7   No results for input(s): "LIPASE", "AMYLASE" in the last 168 hours. No results for input(s): "AMMONIA" in the last 168 hours. Coagulation Profile: No results for input(s): "INR", "PROTIME" in the last 168 hours. Cardiac Enzymes: No results for input(s): "CKTOTAL", "CKMB", "CKMBINDEX", "TROPONINI" in the last 168 hours. BNP (last 3 results) No results for input(s): "PROBNP" in the last 8760 hours. HbA1C: No results for input(s): "HGBA1C" in the last 72 hours. CBG: No results for input(s): "GLUCAP" in the last 168 hours. Lipid Profile: No results for input(s): "CHOL", "HDL", "LDLCALC", "TRIG", "CHOLHDL", "LDLDIRECT" in the last 72 hours. Thyroid Function Tests: No results for input(s): "TSH", "T4TOTAL", "FREET4", "T3FREE", "THYROIDAB" in the last 72 hours. Anemia Panel: No results for input(s): "VITAMINB12", "FOLATE", "FERRITIN", "TIBC", "IRON", "RETICCTPCT" in the last 72 hours. Sepsis Labs: Recent Labs  Lab 09/15/23 0626 09/16/23 0537  PROCALCITON 3.24 2.39    Recent Results  (from the past 240 hour(s))  Resp panel by RT-PCR (RSV, Flu A&B, Covid) Anterior Nasal Swab     Status: None   Collection Time: 09/14/23  4:07 PM   Specimen: Anterior Nasal Swab  Result Value Ref Range Status   SARS Coronavirus 2 by RT PCR NEGATIVE NEGATIVE Final   Influenza A by PCR NEGATIVE NEGATIVE Final   Influenza B by PCR NEGATIVE NEGATIVE Final    Comment: (NOTE) The Xpert Xpress SARS-CoV-2/FLU/RSV plus assay is intended as an aid in the diagnosis of influenza from Nasopharyngeal swab specimens and should not be used as a sole basis for treatment. Nasal washings and aspirates are unacceptable for Xpert Xpress SARS-CoV-2/FLU/RSV testing.  Fact Sheet for Patients: BloggerCourse.com  Fact Sheet for Healthcare Providers: SeriousBroker.it  This test is not yet approved or cleared by the [place] FDA and has been authorized for detection and/or diagnosis of SARS-CoV-2 by FDA under an Emergency Use Authorization (EUA). This EUA will remain in effect (meaning this test can be used) for the duration of the COVID-19 declaration under Section 564(b)(1) of the Act, 21 U.S.C. section 360bbb-3(b)(1), unless the authorization is terminated or revoked.     Resp Syncytial Virus by PCR NEGATIVE NEGATIVE Final    Comment: (NOTE) Fact Sheet for Patients: BloggerCourse.com  Fact Sheet for  Healthcare Providers: SeriousBroker.it  This test is not yet approved or cleared by the [place] and has been authorized for detection and/or diagnosis of SARS-CoV-2 by FDA under an Emergency Use Authorization (EUA). This EUA will remain in effect (meaning this test can be used) for the duration of the COVID-19 declaration under Section 564(b)(1) of the Act, 21 U.S.C. section 360bbb-3(b)(1), unless the authorization is terminated or revoked.  Performed at [facility], [address] [place], [place] [address]          Radiology Studies: No results found.      Scheduled Meds:  apixaban  5 mg Oral BID   furosemide  40 mg Intravenous Q12H   gabapentin  100 mg Oral BID   ipratropium-albuterol  3 mL Nebulization BID   levothyroxine  25 mcg Oral Q0600   loratadine  10 mg Oral Daily   methylPREDNISolone (SOLU-MEDROL) injection  40 mg Intravenous Daily   metoprolol tartrate  12.5 mg Oral BID   mometasone-formoterol  2 puff Inhalation BID   pantoprazole  40 mg Oral QHS   pravastatin  40 mg Oral QHS   Continuous Infusions:  azithromycin 500 mg (09/17/23 2258)   cefTRIAXone (ROCEPHIN)  IV 2 g (09/17/23 2207)          Glade Lloyd, MD [facility] 09/18/2023, 7:42 AM

## 2023-09-19 DIAGNOSIS — J9621 Acute and chronic respiratory failure with hypoxia: Secondary | ICD-10-CM | POA: Diagnosis not present

## 2023-09-19 LAB — CBC WITH DIFFERENTIAL/PLATELET
Abs Immature Granulocytes: 0.06 10*3/uL (ref 0.00–0.07)
Basophils Absolute: 0 10*3/uL (ref 0.0–0.1)
Basophils Relative: 0 %
Eosinophils Absolute: 0 10*3/uL (ref 0.0–0.5)
Eosinophils Relative: 0 %
HCT: 47.4 % (ref 39.0–52.0)
Hemoglobin: 15.1 g/dL (ref 13.0–17.0)
Immature Granulocytes: 1 %
Lymphocytes Relative: 32 %
Lymphs Abs: 2.8 10*3/uL (ref 0.7–4.0)
MCH: 30.6 pg (ref 26.0–34.0)
MCHC: 31.9 g/dL (ref 30.0–36.0)
MCV: 96 fL (ref 80.0–100.0)
Monocytes Absolute: 0.4 10*3/uL (ref 0.1–1.0)
Monocytes Relative: 5 %
Neutro Abs: 5.3 10*3/uL (ref 1.7–7.7)
Neutrophils Relative %: 62 %
Platelets: 222 10*3/uL (ref 150–400)
RBC: 4.94 MIL/uL (ref 4.22–5.81)
RDW: 13.4 % (ref 11.5–15.5)
WBC: 8.6 10*3/uL (ref 4.0–10.5)
nRBC: 0 % (ref 0.0–0.2)

## 2023-09-19 LAB — BASIC METABOLIC PANEL
Anion gap: 14 (ref 5–15)
BUN: 37 mg/dL — ABNORMAL HIGH (ref 8–23)
CO2: 32 mmol/L (ref 22–32)
Calcium: 8.9 mg/dL (ref 8.9–10.3)
Chloride: 93 mmol/L — ABNORMAL LOW (ref 98–111)
Creatinine, Ser: 1.25 mg/dL — ABNORMAL HIGH (ref 0.61–1.24)
GFR, Estimated: 55 mL/min — ABNORMAL LOW (ref >60)
Glucose, Bld: 140 mg/dL — ABNORMAL HIGH (ref 70–99)
Potassium: 3.9 mmol/L (ref 3.5–5.1)
Sodium: 139 mmol/L (ref 135–145)

## 2023-09-19 MED ORDER — PREDNISONE 20 MG PO TABS
40.0000 mg | ORAL_TABLET | Freq: Every day | ORAL | Status: DC
  Administered 2023-09-20: 40 mg via ORAL
  Filled 2023-09-19: qty 2

## 2023-09-19 MED ORDER — FUROSEMIDE 40 MG PO TABS
40.0000 mg | ORAL_TABLET | Freq: Two times a day (BID) | ORAL | Status: DC
  Administered 2023-09-19 – 2023-09-20 (×2): 40 mg via ORAL
  Filled 2023-09-19 (×2): qty 1

## 2023-09-19 NOTE — Progress Notes (Signed)
MEWS Progress Note  Patient Details Name: Eric [name] MRN: [id number] DOB: 1932/05/28 Today's Date: 09/19/2023   MEWS Flowsheet Documentation:  Assess: MEWS Score Temp: 98.2 F (36.8 C) BP: (!) 107/57 MAP (mmHg): 72 Pulse Rate: (!) 57 ECG Heart Rate: 76 Resp: 17 Level of Consciousness: Alert SpO2: 96 % O2 Device: Nasal Cannula O2 Flow Rate (L/min): 3 L/min Assess: MEWS Score MEWS Temp: 0 MEWS Systolic: 0 MEWS Pulse: 0 MEWS RR: 0 MEWS LOC: 0 MEWS Score: 0 MEWS Score Color: Green Assess: SIRS CRITERIA SIRS Temperature : 0 SIRS Respirations : 0 SIRS Pulse: 0 SIRS WBC: 0 SIRS Score Sum : 0 SIRS Temperature : 0 SIRS Pulse: 0 SIRS Respirations : 0 SIRS WBC: 0 SIRS Score Sum : 0 Assess: if the MEWS score is Yellow or Red Were vital signs accurate and taken at a resting state?: Yes Does the patient meet 2 or more of the SIRS criteria?: No MEWS guidelines implemented : Yes, yellow Treat MEWS Interventions: Considered administering scheduled or prn medications/treatments as ordered Take Vital Signs Increase Vital Sign Frequency : Yellow: Q2hr x1, continue Q4hrs until patient remains green for 12hrs Escalate MEWS: Escalate: Yellow: Discuss with charge nurse and consider notifying provider and/or RRT    Patient triggered a yellow MEW due to his HR. I rechecked his vitals HR because it was out of his normal range. Patient was resting in his recliner during this time.     Deiree' Ryheem Jay,RN  09/19/2023, 5:35 PM

## 2023-09-19 NOTE — Progress Notes (Signed)
PROGRESS NOTE    Eric [name]  FAO:[id number] DOB: [birth year] DOA: 09/14/2023 PCP: Sharon Seller, NP   Brief Narrative:  89 y.o. male with medical history significant for COPD/chronic bronchitis, history of dysphagia unspecified, nocturnal hypoxia on 4 L nasal cannula nightly, ambulatory dysfunction with walker, hypothyroidism, permanent A-fib on Eliquis, hyperlipidemia, GERD presented with worsening shortness of breath.  On presentation, patient had bilateral lower extremity edema and was tachypneic with WBC of 14.8, chest x-ray showing bibasilar opacities.  He was started on IV Solu-Medrol, antibiotics and Lasix.  Assessment & Plan:   Acute on chronic hypoxic respiratory failure -Possibly from COPD, multifocal pneumonia and CHF exacerbation -Normally uses 4 L oxygen via nasal cannula at night.  Currently requiring 3 to 4 L supplemental oxygen during daytime as well. -COVID-19/influenza/RSV PCR negative.  CT chest showed possible multifocal pneumonia.  Multifocal pneumonia with concern for aspiration -Finished 5-day course of Rocephin and Zithromax.    COPD exacerbation -Continue current nebs and inhaler treatment.  Currently on Solu-Medrol.  Switch to oral prednisone.  CHF exacerbation -2D echo shows EF of 60 to 65%.  Strict input and output.  Daily weights.  Fluid restriction.   -Continue IV Lasix.  Patient has had good diuresis with negative balance of 8124.7 cc since admission.  Switch to oral Lasix from today  History of dysphagia with concern for aspiration -Patient has had intermittent dysphagia with solid food -Diet as per SLP recommendations..  Leukocytosis -Resolved   generalized weakness/ambulatory dysfunction -PT/OT recommend home and PT/OT  Permanent A-fib on Eliquis -Rate mostly controlled.  Continue Eliquis.  Outpatient follow-up with cardiology.  Continue metoprolol  Obesity -Outpatient follow-up  Hypothyroidism -Continue  levothyroxine  Hyperlipidemia -Continue pravastatin  Peripheral neuropathy -Continue gabapentin  GERD -Continue PPI   DVT prophylaxis: Eliquis Code Status: DNR Family Communication: None at bedside Disposition Plan: Status is: Inpatient Remains inpatient appropriate because: Of severity of illness.  Possible discharge in 1 to 2 days if remains stable.    Consultants: None  Procedures: None  Antimicrobials: Rocephin and Zithromax from 09/14/2023-09/18/2023  Subjective: Patient seen and examined at bedside.  Feels slightly more weak today with slightly more cough.  Does not feel ready to go home today.  No fever or vomiting reported.   Objective: Vitals:   09/19/23 0447 09/19/23 0500 09/19/23 0739 09/19/23 0750  BP: 123/79  138/84   Pulse: (!) 51  (!) 55 61  Resp: 16   18  Temp: (!) 97.4 F (36.3 C)  98.2 F (36.8 C)   TempSrc:   Oral   SpO2: 97%  95% 97%  Weight:  83 kg    Height:        Intake/Output Summary (Last 24 hours) at 09/19/2023 1033 Last data filed at 09/19/2023 0849 Gross per 24 hour  Intake 200 ml  Output 2100 ml  Net -1900 ml   Filed Weights   09/17/23 0351 09/18/23 0414 09/19/23 0500  Weight: 86.2 kg 84.4 kg 83 kg    Examination:  General: No acute distress.  On 3 L oxygen by nasal cannula.  Elderly male sitting on chair.  Chronically ill and deconditioned looking. ENT/neck: No obvious neck masses or JVD elevation noted  respiratory: Bilateral decreased breath sounds at bases with scattered crackles CVS: Intermittently bradycardic; S1 and S2 are heard  abdominal: Soft, nontender, distended mildly; no organomegaly, bowel sounds normally heard  extremities: Bilateral lower extremity edema with chronic skin changes present; no clubbing  CNS: Alert and  awake but still slightly slow to respond.  No focal deficits noted lymph: No obvious lymphadenopathy  skin: No obvious petechiae/lesions  psych: Has mostly flat affect with no signs of agitation  currently.   Musculoskeletal: No obvious joint erythema/deformity  Data Reviewed: I have personally reviewed following labs and imaging studies  CBC: Recent Labs  Lab 09/14/23 1607 09/15/23 0626 09/16/23 0537 09/18/23 1004 09/19/23 0927  WBC 14.8* 13.1* 14.2* 10.0 8.6  NEUTROABS 12.1*  --  12.7* 7.8* 5.3  HGB 13.7 13.8 12.6* 14.3 15.1  HCT 43.2 43.0 39.9 44.7 47.4  MCV 96.6 98.6 97.3 98.2 96.0  PLT 164 161 150 219 222   Basic Metabolic Panel: Recent Labs  Lab 09/14/23 1707 09/15/23 0626 09/16/23 0537 09/17/23 0650 09/18/23 1004  NA 140 137 137 140 136  K 4.1 4.1 4.2 3.9 4.0  CL 104 102 101 98 95*  CO2 23 24 27 31 28   GLUCOSE 139* 164* 143* 117* 156*  BUN 25* 28* 35* 33* 34*  CREATININE 1.12 1.25* 1.27* 1.36* 1.36*  CALCIUM 8.9 8.5* 8.6* 8.5* 8.7*  MG  --  2.0 2.2 2.2 2.3  PHOS  --  2.4*  --   --   --    GFR: Estimated Creatinine Clearance: 37.2 mL/min (A) (by C-G formula based on SCr of 1.36 mg/dL (H)). Liver Function Tests: Recent Labs  Lab 09/14/23 1707  AST 40  ALT 40  ALKPHOS 47  BILITOT 1.7*  PROT 6.9  ALBUMIN 3.7   No results for input(s): "LIPASE", "AMYLASE" in the last 168 hours. No results for input(s): "AMMONIA" in the last 168 hours. Coagulation Profile: No results for input(s): "INR", "PROTIME" in the last 168 hours. Cardiac Enzymes: No results for input(s): "CKTOTAL", "CKMB", "CKMBINDEX", "TROPONINI" in the last 168 hours. BNP (last 3 results) No results for input(s): "PROBNP" in the last 8760 hours. HbA1C: No results for input(s): "HGBA1C" in the last 72 hours. CBG: No results for input(s): "GLUCAP" in the last 168 hours. Lipid Profile: No results for input(s): "CHOL", "HDL", "LDLCALC", "TRIG", "CHOLHDL", "LDLDIRECT" in the last 72 hours. Thyroid Function Tests: No results for input(s): "TSH", "T4TOTAL", "FREET4", "T3FREE", "THYROIDAB" in the last 72 hours. Anemia Panel: No results for input(s): "VITAMINB12", "FOLATE", "FERRITIN",  "TIBC", "IRON", "RETICCTPCT" in the last 72 hours. Sepsis Labs: Recent Labs  Lab 09/15/23 0626 09/16/23 0537  PROCALCITON 3.24 2.39    Recent Results (from the past 240 hour(s))  Resp panel by RT-PCR (RSV, Flu A&B, Covid) Anterior Nasal Swab     Status: None   Collection Time: 09/14/23  4:07 PM   Specimen: Anterior Nasal Swab  Result Value Ref Range Status   SARS Coronavirus 2 by RT PCR NEGATIVE NEGATIVE Final   Influenza A by PCR NEGATIVE NEGATIVE Final   Influenza B by PCR NEGATIVE NEGATIVE Final    Comment: (NOTE) The Xpert Xpress SARS-CoV-2/FLU/RSV plus assay is intended as an aid in the diagnosis of influenza from Nasopharyngeal swab specimens and should not be used as a sole basis for treatment. Nasal washings and aspirates are unacceptable for Xpert Xpress SARS-CoV-2/FLU/RSV testing.  Fact Sheet for Patients: BloggerCourse.com  Fact Sheet for Healthcare Providers: SeriousBroker.it  This test is not yet approved or cleared by the [place] FDA and has been authorized for detection and/or diagnosis of SARS-CoV-2 by FDA under an Emergency Use Authorization (EUA). This EUA will remain in effect (meaning this test can be used) for the duration of the COVID-19 declaration under  Section 564(b)(1) of the Act, 21 U.S.C. section 360bbb-3(b)(1), unless the authorization is terminated or revoked.     Resp Syncytial Virus by PCR NEGATIVE NEGATIVE Final    Comment: (NOTE) Fact Sheet for Patients: BloggerCourse.com  Fact Sheet for Healthcare Providers: SeriousBroker.it  This test is not yet approved or cleared by the [place] FDA and has been authorized for detection and/or diagnosis of SARS-CoV-2 by FDA under an Emergency Use Authorization (EUA). This EUA will remain in effect (meaning this test can be used) for the duration of the COVID-19 declaration under Section  564(b)(1) of the Act, 21 U.S.C. section 360bbb-3(b)(1), unless the authorization is terminated or revoked.  Performed at [facility], [address], [place], [place] [address]          Radiology Studies: No results found.      Scheduled Meds:  apixaban  5 mg Oral BID   furosemide  40 mg Intravenous Q12H   gabapentin  100 mg Oral BID   levothyroxine  25 mcg Oral Q0600   loratadine  10 mg Oral Daily   methylPREDNISolone (SOLU-MEDROL) injection  40 mg Intravenous Daily   metoprolol tartrate  12.5 mg Oral BID   mometasone-formoterol  2 puff Inhalation BID   pantoprazole  40 mg Oral QHS   pravastatin  40 mg Oral QHS   Continuous Infusions:          Glade Lloyd, MD [facility] 09/19/2023, 10:33 AM

## 2023-09-20 DIAGNOSIS — I4821 Permanent atrial fibrillation: Secondary | ICD-10-CM

## 2023-09-20 DIAGNOSIS — J189 Pneumonia, unspecified organism: Secondary | ICD-10-CM

## 2023-09-20 DIAGNOSIS — J9621 Acute and chronic respiratory failure with hypoxia: Secondary | ICD-10-CM | POA: Diagnosis not present

## 2023-09-20 DIAGNOSIS — I5033 Acute on chronic diastolic (congestive) heart failure: Secondary | ICD-10-CM | POA: Diagnosis not present

## 2023-09-20 DIAGNOSIS — J441 Chronic obstructive pulmonary disease with (acute) exacerbation: Secondary | ICD-10-CM | POA: Diagnosis not present

## 2023-09-20 LAB — CBC WITH DIFFERENTIAL/PLATELET
Abs Immature Granulocytes: 0.08 10*3/uL — ABNORMAL HIGH (ref 0.00–0.07)
Basophils Absolute: 0 10*3/uL (ref 0.0–0.1)
Basophils Relative: 0 %
Eosinophils Absolute: 0 10*3/uL (ref 0.0–0.5)
Eosinophils Relative: 0 %
HCT: 47 % (ref 39.0–52.0)
Hemoglobin: 15.3 g/dL (ref 13.0–17.0)
Immature Granulocytes: 1 %
Lymphocytes Relative: 20 %
Lymphs Abs: 2 10*3/uL (ref 0.7–4.0)
MCH: 31.3 pg (ref 26.0–34.0)
MCHC: 32.6 g/dL (ref 30.0–36.0)
MCV: 96.1 fL (ref 80.0–100.0)
Monocytes Absolute: 0.5 10*3/uL (ref 0.1–1.0)
Monocytes Relative: 6 %
Neutro Abs: 7.1 10*3/uL (ref 1.7–7.7)
Neutrophils Relative %: 73 %
Platelets: 218 10*3/uL (ref 150–400)
RBC: 4.89 MIL/uL (ref 4.22–5.81)
RDW: 13.2 % (ref 11.5–15.5)
WBC: 9.7 10*3/uL (ref 4.0–10.5)
nRBC: 0 % (ref 0.0–0.2)

## 2023-09-20 LAB — BASIC METABOLIC PANEL
Anion gap: 10 (ref 5–15)
BUN: 37 mg/dL — ABNORMAL HIGH (ref 8–23)
CO2: 32 mmol/L (ref 22–32)
Calcium: 8.9 mg/dL (ref 8.9–10.3)
Chloride: 96 mmol/L — ABNORMAL LOW (ref 98–111)
Creatinine, Ser: 1.03 mg/dL (ref 0.61–1.24)
GFR, Estimated: >60 mL/min (ref >60)
Glucose, Bld: 120 mg/dL — ABNORMAL HIGH (ref 70–99)
Potassium: 3.8 mmol/L (ref 3.5–5.1)
Sodium: 138 mmol/L (ref 135–145)

## 2023-09-20 LAB — MAGNESIUM: Magnesium: 2.4 mg/dL (ref 1.7–2.4)

## 2023-09-20 MED ORDER — FUROSEMIDE 40 MG PO TABS: 40.0000 mg | ORAL_TABLET | Freq: Every day | ORAL | 1 refills | Status: DC

## 2023-09-20 MED ORDER — INFLUENZA VAC A&B SURF ANT ADJ 0.5 ML IM SUSY
0.5000 mL | PREFILLED_SYRINGE | INTRAMUSCULAR | Status: AC
  Administered 2023-09-20: 0.5 mL via INTRAMUSCULAR
  Filled 2023-09-20: qty 0.5

## 2023-09-20 MED ORDER — GUAIFENESIN-DM 100-10 MG/5ML PO SYRP: 10.0000 mL | ORAL_SOLUTION | ORAL | 0 refills | Status: DC | PRN

## 2023-09-20 MED ORDER — PREDNISONE 20 MG PO TABS: ORAL_TABLET | ORAL | 0 refills | Status: AC

## 2023-09-20 NOTE — Discharge Summary (Signed)
Physician Discharge Summary   Patient: Eric [name] MRN: [id number] DOB: 28-Dec-1931  Admit date:     09/14/2023  Discharge date: 09/20/23  Discharge Physician: Marcelino Duster   PCP: Sharon Seller, NP   Recommendations at discharge:  {Tip this will not be part of the note when signed- Example include specific recommendations for outpatient follow-up, pending tests to follow-up on. (Optional):26781}  PCP follow up a in 1 week, Cardiology follow up as scheduled.  Discharge Diagnoses: Principal Problem:   Acute on chronic hypoxic respiratory failure (HCC)  Resolved Problems:   * No resolved hospital problems. [facility] Course: No notes on file  Assessment and Plan: No notes have been filed under this hospital service. Service: Hospitalist     {Tip this will not be part of the note when signed Body mass index is 29.18 kg/m. , ,  (Optional):26781}  {(NOTE) Pain control PDMP Statment (Optional):26782} Consultants: *** Procedures performed: ***  Disposition: {Plan; Disposition:26390} Diet recommendation:  Discharge Diet Orders (From admission, onward)     Start     Ordered   09/20/23 0000  Diet - low sodium heart healthy        09/20/23 1057           {Diet_Plan:26776} DISCHARGE MEDICATION: Allergies as of 09/20/2023       Reactions   Morphine And Codeine Shortness Of Breath   Percocet [oxycodone-acetaminophen] Shortness Of Breath   Valium Shortness Of Breath        Medication List     TAKE these medications    albuterol (2.5 MG/3ML) 0.083% nebulizer solution Commonly known as: PROVENTIL Take 3 mLs (2.5 mg total) by nebulization every 6 (six) hours as needed for wheezing or shortness of breath.   cetirizine 10 MG tablet Commonly known as: ZYRTEC TAKE 1 TABLET(10 MG) BY MOUTH DAILY   Eliquis 5 MG Tabs tablet Generic drug: apixaban TAKE 1 TABLET BY MOUTH TWICE DAILY   fluticasone 50 MCG/ACT nasal spray Commonly known as: FLONASE Place 1  spray into both nostrils daily as needed for allergies or rhinitis.   fluticasone-salmeterol 250-50 MCG/ACT Aepb Commonly known as: Advair Diskus Inhale 1 puff into the lungs in the morning and at bedtime.   furosemide 40 MG tablet Commonly known as: LASIX Take 1 tablet (40 mg total) by mouth daily.   gabapentin 300 MG capsule Commonly known as: NEURONTIN TAKE 1 CAPSULE(300 MG) BY MOUTH TWICE DAILY   guaiFENesin-dextromethorphan 100-10 MG/5ML syrup Commonly known as: ROBITUSSIN DM Take 10 mLs by mouth every 4 (four) hours as needed for cough.   levothyroxine 25 MCG tablet Commonly known as: SYNTHROID TAKE 1 TABLET BY MOUTH EVERY DAY BEFORE BREAKFAST AS DIRECTED   metoprolol tartrate 25 MG tablet Commonly known as: LOPRESSOR Take 0.5 tablets (12.5 mg total) by mouth 2 (two) times daily.   OXYGEN Inhale 4 L into the lungs at bedtime.   pantoprazole 40 MG tablet Commonly known as: PROTONIX Take 40 mg by mouth at bedtime.   pravastatin 40 MG tablet Commonly known as: PRAVACHOL TAKE 1 TABLET BY MOUTH EVERY DAY What changed:  how much to take how to take this when to take this additional instructions   predniSONE 20 MG tablet Commonly known as: DELTASONE Take 2 tablets (40 mg total) by mouth daily with breakfast for 2 days, THEN 1 tablet (20 mg total) daily with breakfast for 3 days. Start taking on: September 21, 2023   Vitamin D 1000 units capsule Take  one tablet once daily        Discharge Exam: Filed Weights   09/18/23 0414 09/19/23 0500 09/20/23 0500  Weight: 84.4 kg 83 kg 82 kg   ***  Condition at discharge: {DC Condition:26389}  The results of significant diagnostics from this hospitalization (including imaging, microbiology, ancillary and laboratory) are listed below for reference.   Imaging Studies: DG Swallowing Func-Speech Pathology  Result Date: 09/15/2023 Table formatting from the original result was not included. Modified Barium Swallow Study  Patient Details Name: Eric [name] MRN: [id number] Date of Birth: [birth year] Today's Date: 09/15/2023 HPI/PMH: HPI: Eric [name] is a 89 y.o. male with medical history significant for COPD/chronic bronchitis, history of dysphagia with concern for aspiration (solid food per family), nocturnal hypoxia on 4 L nasal cannula nightly, ambulatory dysfunction with walker, hypothyroidism, permanent A-fib on Eliquis, hyperlipidemia, GERD, who presented with complaints of progressively worsening shortness of breath for the past few days.  Associated with generalized weakness.  Acute on chronic hypoxic respiratory failure suspect secondary to COPD and chronic diastolic CHF exacerbation. Clinical Impression: Clinical Impression: Pt demonstrates mild oropharyngeal dysphagia; mastication is inefficient but thorough. Family complains he eats too quickly; today his mastication was consistently complete, but he did swallow every two bites with vallecular packing. Pt initiated swallow sometimes at the pyriform sinuses allowing for instances of trace penetration of thin liquids. Pt sensed penetration if it descended close to the glottis. After starting solid trials pt had very hard coughing episodes there were unrelated to any airway invasion; Esophageal sweep showed diffuse residue and changes to esophageal tract due to curvature of spine. Suspect cough Lunney be related to esophageal stasis than any aspiration. Showed pt and family images and reinforced relatively good ariway protection and swallowing with mild residue. Emphasized benefit of enjoying meals for QOL. Offered HH SLP f/u for meal pacing. but pt said he really didnt want that and daughter was supportive. No SLP f/u needed at this time. Factors that Surles increase risk of adverse event in presence of aspiration [name] & [name] 2021): Factors that Shonk increase risk of adverse event in presence of aspiration [name] & [name] 2021): Frail or deconditioned; Respiratory or GI disease  Recommendations/Plan: Swallowing Evaluation Recommendations Swallowing Evaluation Recommendations Liquid Administration via: Cup; Straw Medication Administration: Whole meds with liquid Supervision: Patient able to self-feed Swallowing strategies  : Slow rate; Small bites/sips Postural changes: Stay upright 30-60 min after meals Treatment Plan Treatment Plan Treatment recommendations: No treatment recommended at this time Follow-up recommendations: No SLP follow up Recommendations Recommendations for follow up therapy are one component of a multi-disciplinary discharge planning process, led by the attending physician.  Recommendations [place] be updated based on patient status, additional functional criteria and insurance authorization. Assessment: Orofacial Exam: No data recorded Anatomy: Anatomy: Presence of cervical hardware; Other (Comment) (posterior hardware severe kyphosis/lordosis) Boluses Administered: No data recorded Oral Impairment Domain: Oral Impairment Domain Lip Closure: No labial escape Tongue control during bolus hold: Cohesive bolus between tongue to palatal seal Bolus preparation/mastication: Slow prolonged chewing/mashing with complete recollection Bolus transport/lingual motion: Slow tongue motion Oral residue: Trace residue lining oral structures Location of oral residue : Tongue Initiation of pharyngeal swallow : Pyriform sinuses  Pharyngeal Impairment Domain: Pharyngeal Impairment Domain Soft palate elevation: No bolus between soft palate (SP)/pharyngeal wall (PW) Laryngeal elevation: Complete superior movement of thyroid cartilage with complete approximation of arytenoids to epiglottic petiole Anterior hyoid excursion: Complete anterior movement Epiglottic movement: Complete inversion Laryngeal vestibule closure: Complete, no  EXTRACTION W/ INTRAOCULAR LENS  IMPLANT, BILATERAL    CERVICAL FUSION  02/10/2011  CHOLECYSTECTOMY  05/24/2018  CHOLECYSTECTOMY N/A 05/24/2018  Procedure: LAPAROSCOPIC CHOLECYSTECTOMY;  Surgeon: Abigail Miyamoto, MD;  Location: [facility] OR;  Service: General;  Laterality:  N/A;  COLONOSCOPY WITH PROPOFOL N/A 02/20/2018  Procedure: COLONOSCOPY WITH PROPOFOL;  Surgeon: Midge Minium, MD;  Location: [facility] ENDOSCOPY;  Service: Endoscopy;  Laterality: N/A;  history of abd ultrasound  11/01  fatty liver  MULTIPLE TOOTH EXTRACTIONS    ORIF FIBULA FRACTURE Left 01/06/2017  Procedure: OPEN REDUCTION INTERNAL FIXATION (ORIF) FIBULA FRACTURE DISTAL FIBULA;  Surgeon: Marcene Corning, MD;  Location: [facility] OR;  Service: Orthopedics;  Laterality: Left;  Patient states has problems if he will have a tube in throat for Genera; Anesthesia DeBlois, [name] 09/15/2023, 1:51 PM  ECHOCARDIOGRAM COMPLETE  Result Date: 09/15/2023    ECHOCARDIOGRAM REPORT   Patient Name:   JAMIERE GULAS Cunliffe Date of Exam: 09/15/2023 Medical Rec #:  [id number]  Height:       66.0 in Accession #:    [id number] Weight:       189.4 lb Date of Birth:  1932/05/31   BSA:          1.954 m Patient Age:    89 years   BP:           107/54 mmHg Patient Gender: M          HR:           70 bpm. Exam Location:  Inpatient Procedure: 2D Echo, Color Doppler, Cardiac Doppler and Intracardiac            Opacification Agent Indications:    CHF  History:        Patient has prior history of Echocardiogram examinations, most                 recent 10/20/2017. CHF and Ascending Aortic Aneurysm, CKD,                 Arrythmias:Atrial Fibrillation; Risk Factors:Dyslipidemia and                 Hypertension.  Sonographer:    Melissa Morford RDCS (AE, PE) Referring Phys: [id number] CAROLE N HALL IMPRESSIONS  1. Left ventricular ejection fraction, by estimation, is 60 to 65%. The left ventricle has normal function. The left ventricle has no regional wall motion abnormalities. Left ventricular diastolic parameters are indeterminate.  2. Right ventricular systolic function is moderately reduced. The right ventricular size is moderately enlarged. There is mildly elevated pulmonary artery systolic pressure.  3. Left atrial size was severely dilated.  4. Right atrial size  was severely dilated.  5. The mitral valve is normal in structure. Mild mitral valve regurgitation. No evidence of mitral stenosis.  6. The aortic valve is normal in structure. Aortic valve regurgitation is trivial. No aortic stenosis is present.  7. The inferior vena cava is dilated in size with <50% respiratory variability, suggesting right atrial pressure of 15 mmHg. FINDINGS  Left Ventricle: Left ventricular ejection fraction, by estimation, is 60 to 65%. The left ventricle has normal function. The left ventricle has no regional wall motion abnormalities. Definity contrast agent was given IV to delineate the left ventricular  endocardial borders. The left ventricular internal cavity size was normal in size. There is no left ventricular hypertrophy. Left ventricular diastolic parameters are indeterminate. Right Ventricle: The right ventricular size is moderately enlarged. No increase in right ventricular wall thickness. Right  one tablet once daily        Discharge Exam: Filed Weights   09/18/23 0414 09/19/23 0500 09/20/23 0500  Weight: 84.4 kg 83 kg 82 kg   ***  Condition at discharge: {DC Condition:26389}  The results of significant diagnostics from this hospitalization (including imaging, microbiology, ancillary and laboratory) are listed below for reference.   Imaging Studies: DG Swallowing Func-Speech Pathology  Result Date: 09/15/2023 Table formatting from the original result was not included. Modified Barium Swallow Study  Patient Details Name: Eric [name] MRN: [id number] Date of Birth: [birth year] Today's Date: 09/15/2023 HPI/PMH: HPI: Eric [name] is a 89 y.o. male with medical history significant for COPD/chronic bronchitis, history of dysphagia with concern for aspiration (solid food per family), nocturnal hypoxia on 4 L nasal cannula nightly, ambulatory dysfunction with walker, hypothyroidism, permanent A-fib on Eliquis, hyperlipidemia, GERD, who presented with complaints of progressively worsening shortness of breath for the past few days.  Associated with generalized weakness.  Acute on chronic hypoxic respiratory failure suspect secondary to COPD and chronic diastolic CHF exacerbation. Clinical Impression: Clinical Impression: Pt demonstrates mild oropharyngeal dysphagia; mastication is inefficient but thorough. Family complains he eats too quickly; today his mastication was consistently complete, but he did swallow every two bites with vallecular packing. Pt initiated swallow sometimes at the pyriform sinuses allowing for instances of trace penetration of thin liquids. Pt sensed penetration if it descended close to the glottis. After starting solid trials pt had very hard coughing episodes there were unrelated to any airway invasion; Esophageal sweep showed diffuse residue and changes to esophageal tract due to curvature of spine. Suspect cough Lunney be related to esophageal stasis than any aspiration. Showed pt and family images and reinforced relatively good ariway protection and swallowing with mild residue. Emphasized benefit of enjoying meals for QOL. Offered HH SLP f/u for meal pacing. but pt said he really didnt want that and daughter was supportive. No SLP f/u needed at this time. Factors that Surles increase risk of adverse event in presence of aspiration [name] & [name] 2021): Factors that Shonk increase risk of adverse event in presence of aspiration [name] & [name] 2021): Frail or deconditioned; Respiratory or GI disease  Recommendations/Plan: Swallowing Evaluation Recommendations Swallowing Evaluation Recommendations Liquid Administration via: Cup; Straw Medication Administration: Whole meds with liquid Supervision: Patient able to self-feed Swallowing strategies  : Slow rate; Small bites/sips Postural changes: Stay upright 30-60 min after meals Treatment Plan Treatment Plan Treatment recommendations: No treatment recommended at this time Follow-up recommendations: No SLP follow up Recommendations Recommendations for follow up therapy are one component of a multi-disciplinary discharge planning process, led by the attending physician.  Recommendations [place] be updated based on patient status, additional functional criteria and insurance authorization. Assessment: Orofacial Exam: No data recorded Anatomy: Anatomy: Presence of cervical hardware; Other (Comment) (posterior hardware severe kyphosis/lordosis) Boluses Administered: No data recorded Oral Impairment Domain: Oral Impairment Domain Lip Closure: No labial escape Tongue control during bolus hold: Cohesive bolus between tongue to palatal seal Bolus preparation/mastication: Slow prolonged chewing/mashing with complete recollection Bolus transport/lingual motion: Slow tongue motion Oral residue: Trace residue lining oral structures Location of oral residue : Tongue Initiation of pharyngeal swallow : Pyriform sinuses  Pharyngeal Impairment Domain: Pharyngeal Impairment Domain Soft palate elevation: No bolus between soft palate (SP)/pharyngeal wall (PW) Laryngeal elevation: Complete superior movement of thyroid cartilage with complete approximation of arytenoids to epiglottic petiole Anterior hyoid excursion: Complete anterior movement Epiglottic movement: Complete inversion Laryngeal vestibule closure: Complete, no  one tablet once daily        Discharge Exam: Filed Weights   09/18/23 0414 09/19/23 0500 09/20/23 0500  Weight: 84.4 kg 83 kg 82 kg   ***  Condition at discharge: {DC Condition:26389}  The results of significant diagnostics from this hospitalization (including imaging, microbiology, ancillary and laboratory) are listed below for reference.   Imaging Studies: DG Swallowing Func-Speech Pathology  Result Date: 09/15/2023 Table formatting from the original result was not included. Modified Barium Swallow Study  Patient Details Name: Eric [name] MRN: [id number] Date of Birth: [birth year] Today's Date: 09/15/2023 HPI/PMH: HPI: Eric [name] is a 89 y.o. male with medical history significant for COPD/chronic bronchitis, history of dysphagia with concern for aspiration (solid food per family), nocturnal hypoxia on 4 L nasal cannula nightly, ambulatory dysfunction with walker, hypothyroidism, permanent A-fib on Eliquis, hyperlipidemia, GERD, who presented with complaints of progressively worsening shortness of breath for the past few days.  Associated with generalized weakness.  Acute on chronic hypoxic respiratory failure suspect secondary to COPD and chronic diastolic CHF exacerbation. Clinical Impression: Clinical Impression: Pt demonstrates mild oropharyngeal dysphagia; mastication is inefficient but thorough. Family complains he eats too quickly; today his mastication was consistently complete, but he did swallow every two bites with vallecular packing. Pt initiated swallow sometimes at the pyriform sinuses allowing for instances of trace penetration of thin liquids. Pt sensed penetration if it descended close to the glottis. After starting solid trials pt had very hard coughing episodes there were unrelated to any airway invasion; Esophageal sweep showed diffuse residue and changes to esophageal tract due to curvature of spine. Suspect cough Lunney be related to esophageal stasis than any aspiration. Showed pt and family images and reinforced relatively good ariway protection and swallowing with mild residue. Emphasized benefit of enjoying meals for QOL. Offered HH SLP f/u for meal pacing. but pt said he really didnt want that and daughter was supportive. No SLP f/u needed at this time. Factors that Surles increase risk of adverse event in presence of aspiration [name] & [name] 2021): Factors that Shonk increase risk of adverse event in presence of aspiration [name] & [name] 2021): Frail or deconditioned; Respiratory or GI disease  Recommendations/Plan: Swallowing Evaluation Recommendations Swallowing Evaluation Recommendations Liquid Administration via: Cup; Straw Medication Administration: Whole meds with liquid Supervision: Patient able to self-feed Swallowing strategies  : Slow rate; Small bites/sips Postural changes: Stay upright 30-60 min after meals Treatment Plan Treatment Plan Treatment recommendations: No treatment recommended at this time Follow-up recommendations: No SLP follow up Recommendations Recommendations for follow up therapy are one component of a multi-disciplinary discharge planning process, led by the attending physician.  Recommendations [place] be updated based on patient status, additional functional criteria and insurance authorization. Assessment: Orofacial Exam: No data recorded Anatomy: Anatomy: Presence of cervical hardware; Other (Comment) (posterior hardware severe kyphosis/lordosis) Boluses Administered: No data recorded Oral Impairment Domain: Oral Impairment Domain Lip Closure: No labial escape Tongue control during bolus hold: Cohesive bolus between tongue to palatal seal Bolus preparation/mastication: Slow prolonged chewing/mashing with complete recollection Bolus transport/lingual motion: Slow tongue motion Oral residue: Trace residue lining oral structures Location of oral residue : Tongue Initiation of pharyngeal swallow : Pyriform sinuses  Pharyngeal Impairment Domain: Pharyngeal Impairment Domain Soft palate elevation: No bolus between soft palate (SP)/pharyngeal wall (PW) Laryngeal elevation: Complete superior movement of thyroid cartilage with complete approximation of arytenoids to epiglottic petiole Anterior hyoid excursion: Complete anterior movement Epiglottic movement: Complete inversion Laryngeal vestibule closure: Complete, no  one tablet once daily        Discharge Exam: Filed Weights   09/18/23 0414 09/19/23 0500 09/20/23 0500  Weight: 84.4 kg 83 kg 82 kg   ***  Condition at discharge: {DC Condition:26389}  The results of significant diagnostics from this hospitalization (including imaging, microbiology, ancillary and laboratory) are listed below for reference.   Imaging Studies: DG Swallowing Func-Speech Pathology  Result Date: 09/15/2023 Table formatting from the original result was not included. Modified Barium Swallow Study  Patient Details Name: Eric [name] MRN: [id number] Date of Birth: [birth year] Today's Date: 09/15/2023 HPI/PMH: HPI: Eric [name] is a 89 y.o. male with medical history significant for COPD/chronic bronchitis, history of dysphagia with concern for aspiration (solid food per family), nocturnal hypoxia on 4 L nasal cannula nightly, ambulatory dysfunction with walker, hypothyroidism, permanent A-fib on Eliquis, hyperlipidemia, GERD, who presented with complaints of progressively worsening shortness of breath for the past few days.  Associated with generalized weakness.  Acute on chronic hypoxic respiratory failure suspect secondary to COPD and chronic diastolic CHF exacerbation. Clinical Impression: Clinical Impression: Pt demonstrates mild oropharyngeal dysphagia; mastication is inefficient but thorough. Family complains he eats too quickly; today his mastication was consistently complete, but he did swallow every two bites with vallecular packing. Pt initiated swallow sometimes at the pyriform sinuses allowing for instances of trace penetration of thin liquids. Pt sensed penetration if it descended close to the glottis. After starting solid trials pt had very hard coughing episodes there were unrelated to any airway invasion; Esophageal sweep showed diffuse residue and changes to esophageal tract due to curvature of spine. Suspect cough Lunney be related to esophageal stasis than any aspiration. Showed pt and family images and reinforced relatively good ariway protection and swallowing with mild residue. Emphasized benefit of enjoying meals for QOL. Offered HH SLP f/u for meal pacing. but pt said he really didnt want that and daughter was supportive. No SLP f/u needed at this time. Factors that Surles increase risk of adverse event in presence of aspiration [name] & [name] 2021): Factors that Shonk increase risk of adverse event in presence of aspiration [name] & [name] 2021): Frail or deconditioned; Respiratory or GI disease  Recommendations/Plan: Swallowing Evaluation Recommendations Swallowing Evaluation Recommendations Liquid Administration via: Cup; Straw Medication Administration: Whole meds with liquid Supervision: Patient able to self-feed Swallowing strategies  : Slow rate; Small bites/sips Postural changes: Stay upright 30-60 min after meals Treatment Plan Treatment Plan Treatment recommendations: No treatment recommended at this time Follow-up recommendations: No SLP follow up Recommendations Recommendations for follow up therapy are one component of a multi-disciplinary discharge planning process, led by the attending physician.  Recommendations [place] be updated based on patient status, additional functional criteria and insurance authorization. Assessment: Orofacial Exam: No data recorded Anatomy: Anatomy: Presence of cervical hardware; Other (Comment) (posterior hardware severe kyphosis/lordosis) Boluses Administered: No data recorded Oral Impairment Domain: Oral Impairment Domain Lip Closure: No labial escape Tongue control during bolus hold: Cohesive bolus between tongue to palatal seal Bolus preparation/mastication: Slow prolonged chewing/mashing with complete recollection Bolus transport/lingual motion: Slow tongue motion Oral residue: Trace residue lining oral structures Location of oral residue : Tongue Initiation of pharyngeal swallow : Pyriform sinuses  Pharyngeal Impairment Domain: Pharyngeal Impairment Domain Soft palate elevation: No bolus between soft palate (SP)/pharyngeal wall (PW) Laryngeal elevation: Complete superior movement of thyroid cartilage with complete approximation of arytenoids to epiglottic petiole Anterior hyoid excursion: Complete anterior movement Epiglottic movement: Complete inversion Laryngeal vestibule closure: Complete, no  EXTRACTION W/ INTRAOCULAR LENS  IMPLANT, BILATERAL    CERVICAL FUSION  02/10/2011  CHOLECYSTECTOMY  05/24/2018  CHOLECYSTECTOMY N/A 05/24/2018  Procedure: LAPAROSCOPIC CHOLECYSTECTOMY;  Surgeon: Abigail Miyamoto, MD;  Location: [facility] OR;  Service: General;  Laterality:  N/A;  COLONOSCOPY WITH PROPOFOL N/A 02/20/2018  Procedure: COLONOSCOPY WITH PROPOFOL;  Surgeon: Midge Minium, MD;  Location: [facility] ENDOSCOPY;  Service: Endoscopy;  Laterality: N/A;  history of abd ultrasound  11/01  fatty liver  MULTIPLE TOOTH EXTRACTIONS    ORIF FIBULA FRACTURE Left 01/06/2017  Procedure: OPEN REDUCTION INTERNAL FIXATION (ORIF) FIBULA FRACTURE DISTAL FIBULA;  Surgeon: Marcene Corning, MD;  Location: [facility] OR;  Service: Orthopedics;  Laterality: Left;  Patient states has problems if he will have a tube in throat for Genera; Anesthesia DeBlois, [name] 09/15/2023, 1:51 PM  ECHOCARDIOGRAM COMPLETE  Result Date: 09/15/2023    ECHOCARDIOGRAM REPORT   Patient Name:   JAMIERE GULAS Cunliffe Date of Exam: 09/15/2023 Medical Rec #:  [id number]  Height:       66.0 in Accession #:    [id number] Weight:       189.4 lb Date of Birth:  1932/05/31   BSA:          1.954 m Patient Age:    89 years   BP:           107/54 mmHg Patient Gender: M          HR:           70 bpm. Exam Location:  Inpatient Procedure: 2D Echo, Color Doppler, Cardiac Doppler and Intracardiac            Opacification Agent Indications:    CHF  History:        Patient has prior history of Echocardiogram examinations, most                 recent 10/20/2017. CHF and Ascending Aortic Aneurysm, CKD,                 Arrythmias:Atrial Fibrillation; Risk Factors:Dyslipidemia and                 Hypertension.  Sonographer:    Melissa Morford RDCS (AE, PE) Referring Phys: [id number] CAROLE N HALL IMPRESSIONS  1. Left ventricular ejection fraction, by estimation, is 60 to 65%. The left ventricle has normal function. The left ventricle has no regional wall motion abnormalities. Left ventricular diastolic parameters are indeterminate.  2. Right ventricular systolic function is moderately reduced. The right ventricular size is moderately enlarged. There is mildly elevated pulmonary artery systolic pressure.  3. Left atrial size was severely dilated.  4. Right atrial size  was severely dilated.  5. The mitral valve is normal in structure. Mild mitral valve regurgitation. No evidence of mitral stenosis.  6. The aortic valve is normal in structure. Aortic valve regurgitation is trivial. No aortic stenosis is present.  7. The inferior vena cava is dilated in size with <50% respiratory variability, suggesting right atrial pressure of 15 mmHg. FINDINGS  Left Ventricle: Left ventricular ejection fraction, by estimation, is 60 to 65%. The left ventricle has normal function. The left ventricle has no regional wall motion abnormalities. Definity contrast agent was given IV to delineate the left ventricular  endocardial borders. The left ventricular internal cavity size was normal in size. There is no left ventricular hypertrophy. Left ventricular diastolic parameters are indeterminate. Right Ventricle: The right ventricular size is moderately enlarged. No increase in right ventricular wall thickness. Right

## 2023-09-20 NOTE — TOC Transition Note (Signed)
Transition of Care [facility]) - CM/SW Discharge Note   Patient Details  Name: Eric [name] MRN: [id number] Date of Birth: [birth year]  Transition [facility]) CM/SW Contact:  Janae Bridgeman, RN Phone Number: 09/20/2023, 11:48 AM   Clinical Narrative:    CM met with the patient at the bedside to discuss TOC needs.  The patient plans to return home today with family.  The granddaughter plans to provide transportation and portable oxygen for home per the patient.  Patient is active with [facility] - orders for PT/OT active and in place for patient's discharge to home.  No other TOC needs.  Bedside nursing to call family to provide transportation to home.   Final next level of care: Home w Home Health Services Barriers to Discharge: No Barriers Identified   Patient Goals and CMS Choice CMS Medicare.gov Compare Post Acute Care list provided to:: Patient Choice offered to / list presented to : Patient  Discharge Placement                         Discharge Plan and Services Additional resources added to the After Visit Summary for     Discharge Planning Services: CM Consult Post Acute Care Choice: Resumption of Svcs/PTA Provider                    HH Arranged: PT, OT HH Agency: [facility] Date [facility] Agency Contacted: 09/20/23 Time HH Agency Contacted: 1147 Representative spoke with at [facility] Agency: Tresa Endo, CM with [facility] aware - orders in place to return home  Social Determinants of Health (SDOH) Interventions SDOH Screenings   Food Insecurity: No Food Insecurity (09/16/2023)  Housing: Low Risk  (09/16/2023)  Transportation Needs: No Transportation Needs (09/16/2023)  Utilities: Not At Risk (09/16/2023)  Alcohol Screen: Low Risk  (08/20/2018)  Depression (PHQ2-9): Low Risk  (03/31/2023)  Financial Resource Strain: Low Risk  (03/09/2018)  Physical Activity: Insufficiently Active (03/09/2018)  Social Connections: Moderately Integrated (03/09/2018)  Stress:  No Stress Concern Present (03/09/2018)  Tobacco Use: Medium Risk (09/14/2023)     Readmission Risk Interventions    09/20/2023   11:48 AM  Readmission Risk Prevention Plan  Post Dischage Appt Complete  Medication Screening Complete  Transportation Screening Complete

## 2023-09-21 ENCOUNTER — Telehealth: Payer: Self-pay

## 2023-09-21 DIAGNOSIS — J189 Pneumonia, unspecified organism: Secondary | ICD-10-CM | POA: Insufficient documentation

## 2023-09-21 DIAGNOSIS — J441 Chronic obstructive pulmonary disease with (acute) exacerbation: Secondary | ICD-10-CM | POA: Insufficient documentation

## 2023-09-21 DIAGNOSIS — J449 Chronic obstructive pulmonary disease, unspecified: Secondary | ICD-10-CM | POA: Insufficient documentation

## 2023-09-21 DIAGNOSIS — I4821 Permanent atrial fibrillation: Secondary | ICD-10-CM | POA: Insufficient documentation

## 2023-09-21 NOTE — Transitions of Care (Post Inpatient/ED Visit) (Signed)
09/21/2023  Name: Eric [name] MRN: [id number] DOB: March 24, 1932  Today's TOC FU Call Status: Today's TOC FU Call Status:: Successful TOC FU Call Completed TOC FU Call Complete Date: 09/21/23 Patient's Name and Date of Birth confirmed.  Transition Care Management Follow-up Telephone Call Date of Discharge: 09/20/23 Discharge Facility: [facility]) Type of Discharge: Inpatient Admission Primary Inpatient Discharge Diagnosis:: "COPD exacerbation" How have you been since you were released from the hospital?: Better (pt  voices he rested well last night-denies any SOB-just using oxygen at night to help him sleep, NO SOB or coughing, Appetitie fair, he has been up walking w/ walker) Any questions or concerns?: No  Items Reviewed: Did you receive and understand the discharge instructions provided?: Yes Medications obtained,verified, and reconciled?: Yes (Medications Reviewed) (pt states his family is picking up Robitussin cough syrup today) Any new allergies since your discharge?: No Dietary orders reviewed?: Yes Type of Diet Ordered:: low salt/heart healthy Do you have support at home?: Yes People in Home: grandchild(ren) Name of Support/Comfort Primary Source: granddaughter assists with pt's care  Medications Reviewed Today: Medications Reviewed Today     Reviewed by Charlyn Minerva, RN (Registered Nurse) on 09/21/23 at 1330  Med List Status: <None>   Medication Order Taking? Sig Documenting Provider Last Dose Status Informant  albuterol (PROVENTIL) (2.5 MG/3ML) 0.083% nebulizer solution [id number] Yes Take 3 mLs (2.5 mg total) by nebulization every 6 (six) hours as needed for wheezing or shortness of breath. Sharon Seller, NP Taking Active Self, Pharmacy Records  cetirizine (ZYRTEC) 10 MG tablet [id number] Yes TAKE 1 TABLET(10 MG) BY MOUTH DAILY Janyth Contes Janene Harvey, NP Taking Active Self, Pharmacy Records  Cholecalciferol (VITAMIN D) 1000 UNITS capsule [id number] Yes Take  one tablet once daily Kermit Balo, DO Taking Active Self, Pharmacy Records  ELIQUIS 5 MG TABS tablet [id number] Yes TAKE 1 TABLET BY MOUTH TWICE DAILY Sharon Seller, NP Taking Active Self, Pharmacy Records  fluticasone [facility]) 50 MCG/ACT nasal spray [id number] Yes Place 1 spray into both nostrils daily as needed for allergies or rhinitis. Coralyn Helling, MD Taking Active Self, Pharmacy Records  fluticasone-salmeterol (ADVAIR DISKUS) 250-50 MCG/ACT AEPB [id number] Yes Inhale 1 puff into the lungs in the morning and at bedtime. Coralyn Helling, MD Taking Active Self, Pharmacy Records  furosemide (LASIX) 40 MG tablet [id number] Yes Take 1 tablet (40 mg total) by mouth daily. Marcelino Duster, MD Taking Active   gabapentin (NEURONTIN) 300 MG capsule [id number] Yes TAKE 1 CAPSULE(300 MG) BY MOUTH TWICE DAILY Janyth Contes, Janene Harvey, NP Taking Active Self, Pharmacy Records  guaiFENesin-dextromethorphan [facility] DM) 100-10 MG/5ML syrup [id number]  Take 10 mLs by mouth every 4 (four) hours as needed for cough. Marcelino Duster, MD  Active   levothyroxine (SYNTHROID) 25 MCG tablet [id number] Yes TAKE 1 TABLET BY MOUTH EVERY DAY BEFORE BREAKFAST AS DIRECTED Janyth Contes Janene Harvey, NP Taking Active Self, Pharmacy Records  metoprolol tartrate (LOPRESSOR) 25 MG tablet [id number] Yes Take 0.5 tablets (12.5 mg total) by mouth 2 (two) times daily. Sharon Seller, NP Taking Active Self, Pharmacy Records  OXYGEN [id number] Yes Inhale 4 L into the lungs at bedtime.  [provider] Taking Active Self, Pharmacy Records  pantoprazole (PROTONIX) 40 MG tablet [id number] Yes Take 40 mg by mouth at bedtime. [provider] Taking Active Self, Pharmacy Records  pravastatin (PRAVACHOL) 40 MG tablet [id number] Yes TAKE 1 TABLET BY MOUTH EVERY DAY  Patient taking differently: Take 40 mg by mouth at bedtime.  Sharon Seller, NP Taking Active Self, Pharmacy Records  predniSONE (DELTASONE) 20 MG tablet  [id number] Yes Take 2 tablets (40 mg total) by mouth daily with breakfast for 2 days, THEN 1 tablet (20 mg total) daily with breakfast for 3 days. Marcelino Duster, MD Taking Active             Home Care and Equipment/Supplies: Were Home Health Services Ordered?: Yes Name of Home Health Agency:: [facility] Has Agency set up a time to come to your home?: Yes First Home Health Visit Date: 09/21/23 (Pt voices that staff came out earlier today for visit) Any new equipment or medical supplies ordered?: No  Functional Questionnaire: Do you need assistance with bathing/showering or dressing?: No Do you need assistance with meal preparation?: No Do you need assistance with eating?: No Do you have difficulty maintaining continence: No Do you need assistance with getting out of bed/getting out of a chair/moving?: No Do you have difficulty managing or taking your medications?: No  Follow up appointments reviewed: PCP Follow-up appointment confirmed?: No (Pt declined making PCP appt during call-states his granddaughter takes him to appts and schedules his appts and he will have her make appt) MD Provider Line Number:[phone] Given: No Specialist Hospital Follow-up appointment confirmed?: No Reason Specialist Follow-Up Not Confirmed: Patient has Specialist Provider Number and will Call for Appointment (pt will have granddaughter call and make cardiology appt) Do you need transportation to your follow-up appointment?: No (pt confirms granddaughter takes him to appts) Do you understand care options if your condition(s) worsen?: Yes-patient verbalized understanding  SDOH Interventions Today    Flowsheet Row Most Recent Value  SDOH Interventions   Food Insecurity Interventions Intervention Not Indicated  Housing Interventions Intervention Not Indicated  Transportation Interventions Intervention Not Indicated  Utilities Interventions Intervention Not Indicated         TOC  Interventions Today    Flowsheet Row Most Recent Value  TOC Interventions   TOC Interventions Discussed/Reviewed TOC Interventions Discussed      Interventions Today    Flowsheet Row Most Recent Value  Chronic Disease   Chronic disease during today's visit Chronic Obstructive Pulmonary Disease (COPD)  General Interventions   General Interventions Discussed/Reviewed General Interventions Discussed, Doctor Visits, Durable Medical Equipment (DME)  Doctor Visits Discussed/Reviewed Doctor Visits Discussed, PCP, Specialist  Durable Medical Equipment (DME) Oxygen  PCP/Specialist Visits Compliance with follow-up visit  Education Interventions   Education Provided Provided Education  Provided Verbal Education On Nutrition, Medication, When to see the doctor, Other  [resp sx mgmt]  Nutrition Interventions   Nutrition Discussed/Reviewed Nutrition Discussed, Fluid intake  Pharmacy Interventions   Pharmacy Dicussed/Reviewed Pharmacy Topics Discussed, Medications and their functions  Safety Interventions   Safety Discussed/Reviewed Safety Discussed, Home Safety  Home Safety Assistive Devices        Goals Addressed             This Visit's Progress    TOC Care Plan       Current Barriers:  Chronic Disease Management support and education needs related to COPD   RNCM Clinical Goal(s):  Patient will work with the Care Management team over the next 30 days to address Transition of Care Barriers: disease mgmt verbalize understanding of plan for management of COPD as evidenced by adherence to care plan take all medications exactly as prescribed and will call provider for medication related questions as evidenced by completion of Prednisone tapering dose attend all scheduled medical appointments:   as evidenced by  attending PCP and cardiology appts demonstrate Ongoing adherence to prescribed treatment plan for COPD as evidenced by no readmission demonstrate Ongoing health management  independence as evidenced by taking meds, completing appts and adhering to COPD plan  through collaboration with RN Care manager, provider, and care team.   Interventions: Evaluation of current treatment plan related to  self management and patient's adherence to plan as established by provider   COPD Interventions:  (Status:  New goal.) Short Term Goal Provided instruction about proper use of medications used for management of COPD including inhalers Discussed the importance of adequate rest and management of fatigue with COPD Assessed social determinant of health barriers Reviewed resp sx mgmt measures  Patient Goals/Self-Care Activities: Participate in Transition of Care Program/Attend TOC scheduled calls Take all medications as prescribed Attend all scheduled provider appointments Call provider office for new concerns or questions  follow rescue plan if symptoms flare-up  Follow Up Plan:  Telephone follow up appointment with care management team member scheduled for:  next week The patient has been provided with contact information for the care management team and has been advised to call with any health related questions or concerns.          Antionette Fairy, RN,BSN,CCM RN Care Manager Transitions of Care  [place]/Population [facility]  Direct Phone: [phone] Toll Free: [phone] Fax: [phone]

## 2023-09-21 NOTE — Telephone Encounter (Signed)
Also needs to schedule hospital follow up, please fwd to [facility] calls

## 2023-09-21 NOTE — Telephone Encounter (Signed)
Physical therapist with [facility] called requesting verbal orders for PT once week x 6 weeks.  Per [employer] standing order, verbal order given. Message will be sent to patient's provider as a FYI.

## 2023-09-21 NOTE — Telephone Encounter (Signed)
It appears [facility] has initiated a TOC call

## 2023-09-29 ENCOUNTER — Ambulatory Visit: Payer: Self-pay

## 2023-09-29 NOTE — Patient Instructions (Signed)
Visit Information  Thank you for taking time to visit with me today. Please don't hesitate to contact me if I can be of assistance to you before our next scheduled telephone appointment.  Our next appointment is by telephone on 10/06/23 at 9am  Following is a copy of your care plan:   Goals Addressed             This Visit's Progress    TOC Care Plan       Current Barriers:  Chronic Disease Management support and education needs related to COPD   RNCM Clinical Goal(s):  Patient will work with the Care Management team over the next 30 days to address Transition of Care Barriers: disease mgmt verbalize understanding of plan for management of COPD as evidenced by adherence to care plan take all medications exactly as prescribed and will call provider for medication related questions as evidenced by completion of Prednisone tapering dose attend all scheduled medical appointments:   as evidenced by attending PCP and cardiology appts demonstrate Ongoing adherence to prescribed treatment plan for COPD as evidenced by no readmission demonstrate Ongoing health management independence as evidenced by taking meds, completing appts and adhering to COPD plan  through collaboration with RN Care manager, provider, and care team.   Interventions: Evaluation of current treatment plan related to  self management and patient's adherence to plan as established by provider   COPD Interventions:  (Status:  Goal on track:  Yes.) Short Term Goal Assessed resp status and sx mgmt Reinforced way to manage COPD in the home-pt voices breathing has bene "pretty good"-denies any SOB-wearing oxygen 24/7 at 4L/min via [place] Reinforced to pt importance of making PC appt-he confirms granddaughter who takes him to appts will do it-declined assistance with making appt  Patient Goals/Self-Care Activities: Avoid SOB and resp triggers-adhere to COPD action plan Make PCP follow up appt Alert MD of any questions or  concerns Maintain safety in the home-report no falls or injuries  Follow Up Plan:  Telephone follow up appointment with care management team member scheduled for:  next week The patient has been provided with contact information for the care management team and has been advised to call with any health related questions or concerns.          The patient verbalized understanding of instructions, educational materials, and care plan provided today and DECLINED offer to receive copy of patient instructions, educational materials, and care plan.   The patient has been provided with contact information for the care management team and has been advised to call with any health related questions or concerns.   Please call the care guide team at [phone] if you need to cancel or reschedule your appointment.   Please call the Suicide and Crisis Lifeline: 988 call the [place] National Suicide Prevention Lifeline: 832-796-3427 or TTY: (509)235-6347 TTY 440 171 4074) to talk to a trained counselor if you are experiencing a Mental Health or Behavioral Health Crisis or need someone to talk to.  Antionette Fairy, RN,BSN,CCM RN Care Manager Transitions of Care  [place]/Population Health  Direct Phone: [phone] Toll Free: [phone] Fax: [phone]

## 2023-09-29 NOTE — Patient Outreach (Signed)
Care Management  Transitions of Care Program Transitions of Care Post-discharge week 2   09/29/2023 Name: Eric [name] MRN: [id number] DOB: 09/15/1932  Subjective: Eric [name] is a 89 y.o. year old male who is a primary care patient of Sharon Seller, NP. The Care Management team Engaged with patient Engaged with patient by telephone to assess and address transitions of care needs.   Consent to Services:  Patient was given information about care management services, agreed to services, and gave verbal consent to participate.   Assessment:     Patient voices he is doing good. He denies any new issues or concerns at present. Breathing is managed at present. He voices that his granddaughter is going to make his PCP appt-declined assistance with making appt. He voices he is eating and sleeping good. No RN CM needs or concerns at this time.       SDOH Interventions    Flowsheet Row Telephone from 09/21/2023 in [employer] Care Coordination  SDOH Interventions   Food Insecurity Interventions Intervention Not Indicated  Housing Interventions Intervention Not Indicated  Transportation Interventions Intervention Not Indicated  Utilities Interventions Intervention Not Indicated        Goals Addressed             This Visit's Progress    TOC Care Plan       Current Barriers:  Chronic Disease Management support and education needs related to COPD   RNCM Clinical Goal(s):  Patient will work with the Care Management team over the next 30 days to address Transition of Care Barriers: disease mgmt verbalize understanding of plan for management of COPD as evidenced by adherence to care plan take all medications exactly as prescribed and will call provider for medication related questions as evidenced by completion of Prednisone tapering dose attend all scheduled medical appointments:   as evidenced by attending PCP and cardiology appts demonstrate Ongoing  adherence to prescribed treatment plan for COPD as evidenced by no readmission demonstrate Ongoing health management independence as evidenced by taking meds, completing appts and adhering to COPD plan  through collaboration with RN Care manager, provider, and care team.   Interventions: Evaluation of current treatment plan related to  self management and patient's adherence to plan as established by provider   COPD Interventions:  (Status:  Goal on track:  Yes.) Short Term Goal Assessed resp status and sx mgmt Reinforced way to manage COPD in the home-pt voices breathing has bene "pretty good"-denies any SOB-wearing oxygen 24/7 at 4L/min via [place] Reinforced to pt importance of making PC appt-he confirms granddaughter who takes him to appts will do it-declined assistance with making appt  Patient Goals/Self-Care Activities: Avoid SOB and resp triggers-adhere to COPD action plan Make PCP follow up appt Alert MD of any questions or concerns Maintain safety in the home-report no falls or injuries  Follow Up Plan:  Telephone follow up appointment with care management team member scheduled for:  next week The patient has been provided with contact information for the care management team and has been advised to call with any health related questions or concerns.          Plan: The patient has been provided with contact information for the care management team and has been advised to call with any health related questions or concerns.  Follow up appt scheduled with patient for 10/06/23 @ 9am.   Antionette Fairy, RN,BSN,CCM RN Care Manager Transitions of Care  [place]Population  Health  Direct Phone: [phone] Toll Free: [phone] Fax: [phone]

## 2023-10-06 ENCOUNTER — Encounter (HOSPITAL_COMMUNITY): Payer: Self-pay

## 2023-10-06 ENCOUNTER — Other Ambulatory Visit: Payer: Self-pay

## 2023-10-06 ENCOUNTER — Emergency Department (HOSPITAL_COMMUNITY)
Admission: EM | Admit: 2023-10-06 | Discharge: 2023-10-06 | Disposition: A | Discharge status: Home / Self Care | Payer: PPO | Attending: Emergency Medicine | Admitting: Emergency Medicine

## 2023-10-06 ENCOUNTER — Ambulatory Visit: Payer: Self-pay

## 2023-10-06 DIAGNOSIS — I4891 Unspecified atrial fibrillation: Secondary | ICD-10-CM | POA: Diagnosis not present

## 2023-10-06 DIAGNOSIS — Z79899 Other long term (current) drug therapy: Secondary | ICD-10-CM | POA: Diagnosis not present

## 2023-10-06 DIAGNOSIS — R040 Epistaxis: Secondary | ICD-10-CM | POA: Diagnosis not present

## 2023-10-06 DIAGNOSIS — Z7901 Long term (current) use of anticoagulants: Secondary | ICD-10-CM | POA: Diagnosis not present

## 2023-10-06 DIAGNOSIS — Z7951 Long term (current) use of inhaled steroids: Secondary | ICD-10-CM | POA: Insufficient documentation

## 2023-10-06 DIAGNOSIS — R0602 Shortness of breath: Secondary | ICD-10-CM | POA: Diagnosis not present

## 2023-10-06 DIAGNOSIS — R059 Cough, unspecified: Secondary | ICD-10-CM | POA: Diagnosis not present

## 2023-10-06 DIAGNOSIS — J449 Chronic obstructive pulmonary disease, unspecified: Secondary | ICD-10-CM | POA: Diagnosis not present

## 2023-10-06 DIAGNOSIS — R001 Bradycardia, unspecified: Secondary | ICD-10-CM | POA: Diagnosis not present

## 2023-10-06 DIAGNOSIS — R58 Hemorrhage, not elsewhere classified: Secondary | ICD-10-CM | POA: Diagnosis not present

## 2023-10-06 LAB — I-STAT CHEM 8, ED
BUN: 28 mg/dL — ABNORMAL HIGH (ref 8–23)
Calcium, Ion: 1.08 mmol/L — ABNORMAL LOW (ref 1.15–1.40)
Chloride: 108 mmol/L (ref 98–111)
Creatinine, Ser: 1.1 mg/dL (ref 0.61–1.24)
Glucose, Bld: 112 mg/dL — ABNORMAL HIGH (ref 70–99)
HCT: 39 % (ref 39.0–52.0)
Hemoglobin: 13.3 g/dL (ref 13.0–17.0)
Potassium: 4.2 mmol/L (ref 3.5–5.1)
Sodium: 143 mmol/L (ref 135–145)
TCO2: 24 mmol/L (ref 22–32)

## 2023-10-06 NOTE — ED Provider Notes (Signed)
[facility] Provider Note   CSN: [id number] Arrival date & time: 10/06/23  4403     History  Chief Complaint  Patient presents with   Epistaxis    Eric [name] is a 89 y.o. male.  HPI 89 year old male history of chronic A-fib, on Eliquis, COPD presents today complaining of nosebleeding.  He states that blood from the left nares.  There was an episode last night which resolved spontaneously.  It began again this morning.  It is now resolved after EMS was there and Placed pressure and gave him Afrin.  He states there was a large amount of blood in the bowl.  He does not feel any different than normal.  He denies chest pain, lightheadedness, dyspnea.  He has had no direct trauma to the area.  He has had some runny nose.  He has not had previous nosebleeds.     Home Medications Prior to Admission medications   Medication Sig Start Date End Date Taking? Authorizing Provider  albuterol (PROVENTIL) (2.5 MG/3ML) 0.083% nebulizer solution Take 3 mLs (2.5 mg total) by nebulization every 6 (six) hours as needed for wheezing or shortness of breath. 01/09/23   Sharon Seller, NP  cetirizine (ZYRTEC) 10 MG tablet TAKE 1 TABLET(10 MG) BY MOUTH DAILY 01/02/23   Sharon Seller, NP  Cholecalciferol (VITAMIN D) 1000 UNITS capsule Take one tablet once daily 08/09/13   Reed, [name] L, DO  ELIQUIS 5 MG TABS tablet TAKE 1 TABLET BY MOUTH TWICE DAILY 04/24/23   Sharon Seller, NP  fluticasone (FLONASE) 50 MCG/ACT nasal spray Place 1 spray into both nostrils daily as needed for allergies or rhinitis. 10/17/22   Coralyn Helling, MD  fluticasone-salmeterol (ADVAIR DISKUS) 250-50 MCG/ACT AEPB Inhale 1 puff into the lungs in the morning and at bedtime. 04/13/23   Coralyn Helling, MD  furosemide (LASIX) 40 MG tablet Take 1 tablet (40 mg total) by mouth daily. 09/20/23 10/20/23  Marcelino Duster, MD  gabapentin (NEURONTIN) 300 MG capsule TAKE 1 CAPSULE(300 MG) BY MOUTH  TWICE DAILY 08/15/23   Sharon Seller, NP  guaiFENesin-dextromethorphan (ROBITUSSIN DM) 100-10 MG/5ML syrup Take 10 mLs by mouth every 4 (four) hours as needed for cough. 09/20/23   Marcelino Duster, MD  levothyroxine (SYNTHROID) 25 MCG tablet TAKE 1 TABLET BY MOUTH EVERY DAY BEFORE BREAKFAST AS DIRECTED 09/13/23   Sharon Seller, NP  metoprolol tartrate (LOPRESSOR) 25 MG tablet Take 0.5 tablets (12.5 mg total) by mouth 2 (two) times daily. 05/16/23   Sharon Seller, NP  OXYGEN Inhale 4 L into the lungs at bedtime.     [provider]  pantoprazole (PROTONIX) 40 MG tablet Take 40 mg by mouth at bedtime.    [provider]  pravastatin (PRAVACHOL) 40 MG tablet TAKE 1 TABLET BY MOUTH EVERY DAY Patient taking differently: Take 40 mg by mouth at bedtime. 03/29/23   Sharon Seller, NP      Allergies    Morphine and codeine, Percocet [oxycodone-acetaminophen], and Valium    Review of Systems   Review of Systems  Physical Exam Updated Vital Signs BP (!) 156/86 (BP Location: Right Arm)   Pulse 70   Temp 98.1 F (36.7 C) (Oral)   Resp 18   SpO2 97%  Physical Exam Vitals reviewed.  Constitutional:      Appearance: Normal appearance.  HENT:     Head: Normocephalic.     Right Ear: External ear normal.  Left Ear: External ear normal.     Nose:     Comments: There is dried blood on his face. Both nares visualized and no clots or active bleeding     Mouth/Throat:     Pharynx: Oropharynx is clear.  Eyes:     Pupils: Pupils are equal, round, and reactive to light.  Cardiovascular:     Rate and Rhythm: Normal rate. Rhythm irregular.     Pulses: Normal pulses.  Pulmonary:     Effort: Pulmonary effort is normal.  Abdominal:     Palpations: Abdomen is soft.  Musculoskeletal:        General: Normal range of motion.     Cervical back: Normal range of motion.  Skin:    General: Skin is warm.     Capillary Refill: Capillary refill takes less than 2  seconds.  Neurological:     General: No focal deficit present.     Mental Status: He is alert.  Psychiatric:        Mood and Affect: Mood normal.        Behavior: Behavior normal.     ED Results / Procedures / Treatments   Labs (all labs ordered are listed, but only abnormal results are displayed) Labs Reviewed  I-STAT CHEM 8, ED - Abnormal; Notable for the following components:      Result Value   BUN 28 (*)    Glucose, Bld 112 (*)    Calcium, Ion 1.08 (*)    All other components within normal limits    EKG None  Radiology No results found.  Procedures Procedures    Medications Ordered in ED Medications - No data to display  ED Course/ Medical Decision Making/ A&P Clinical Course as of 10/06/23 1043  Fri Oct 06, 2023  1042 I-STAT reviewed interpreted within normal limits [DR]    Clinical Course User Index [DR] Margarita Grizzle, MD                                 Medical Decision Making  89 year old male, on chronic anticoagulation, presents today with nosebleed which has stopped with pressure and Afrin. Differential diagnosis includes but is not limited to spontaneous anterior nasal bleeding with Eliquis, posterior bleeding, trauma, masses, generalized bleeding from anticoagulation. Patient reports no other bleeding appears to be localized to his nose. Based on history it appears this is likely an anterior bleed with no blood going down his throat and also bleeding coming from the left nares. It is currently well-controlled with the Afrin.  I discussed with patient that he is at high risk of rebleeding.  However, the most common cause of rebleeding is trauma or increased pressure.  He is advised regarding not touching his nose, decreasing the vascular pressure by any sneezing or coughing with an open mouth. Plan to observe the patient for the next hour and check i-STAT.  If he remains stable we will discharge him with overdose measurements and return  precautions  10:43 AM Vital signs are stable Hemoglobin is normal No rebleeding Discussed return precautions and conservative therapy and continued use of Afrin to left nares twice a day for 3 days       Final Clinical Impression(s) / ED Diagnoses Final diagnoses:  Left-sided epistaxis  Chronic anticoagulation    Rx / DC Orders ED Discharge Orders     None         Nashla Althoff, Duwayne Heck,  MD 10/06/23 1043

## 2023-10-06 NOTE — ED Triage Notes (Signed)
[facility] reports pt coming from home for a nosebleed that started last night and stopped, this morning it started again. Pt is on Eliquis. EMS gave 1 of Afrin and 5 albuterol.

## 2023-10-06 NOTE — Discharge Instructions (Addendum)
Please avoid touching your nose, blowing your nose, sneezing or coughing through your nose.  Do not strain.  If you much cough or sneeze please do so with an open mouth. Put pressure on soft part of nose and hold for at least 20 minutes if it restarts Please gently apply Afrin to left nares twice a day for 3 days. Keep the head of your bed elevated Return if you are having any new onset of bleeding.

## 2023-10-06 NOTE — Patient Outreach (Signed)
Care Management  Transitions of Care Program Transitions of Care Post-discharge week 3   10/06/2023 Name: Eric [name] MRN: [id number] DOB: 1932/10/15  Subjective: Eric [name] is a 89 y.o. year old male who is a primary care patient of Sharon Seller, NP. The Care Management team  Engaged with patient by telephone to assess and address transitions of care needs.   Consent to Services:  Patient was given information about care management services, agreed to services, and gave verbal consent to participate.   Assessment:   Spoke with wife. She shares that pt started having nosebleeds yesterday evening around 7pm after he sneezed. He continued to have a large of amount of bleeding for some time then they were able to stop it. Patient then started to have nose bleeding again this morning. They were concerned about amount of blood loss  and the fact that pt is taking blood thinner and called EMS. Wife states EMS just left the home about 20 mins ago and is transporting pt to [facility] ED for further eval.        SDOH Interventions    Flowsheet Row Telephone from 09/21/2023 in [facility] Care Coordination  SDOH Interventions   Food Insecurity Interventions Intervention Not Indicated  Housing Interventions Intervention Not Indicated  Transportation Interventions Intervention Not Indicated  Utilities Interventions Intervention Not Indicated        Goals Addressed             This Visit's Progress    TOC Care Plan       Current Barriers:  Chronic Disease Management support and education needs related to COPD   RNCM Clinical Goal(s):  Patient will work with the Care Management team over the next 30 days to address Transition of Care Barriers: disease mgmt verbalize understanding of plan for management of COPD as evidenced by adherence to care plan take all medications exactly as prescribed and will call provider for medication related questions as evidenced by  completion of Prednisone tapering dose attend all scheduled medical appointments:   as evidenced by attending PCP and cardiology appts demonstrate Ongoing adherence to prescribed treatment plan for COPD as evidenced by no readmission demonstrate Ongoing health management independence as evidenced by taking meds, completing appts and adhering to COPD plan  through collaboration with RN Care manager, provider, and care team.   Interventions: Evaluation of current treatment plan related to  self management and patient's adherence to plan as established by provider   COPD Interventions:  (Status:  Goal on track:  Yes.) Short Term Goal Assessed resp status and sx mgmt Reinforced way to manage COPD in the home-pt voices breathing has bene "pretty good"-denies any SOB-wearing oxygen 24/7 at 4L/min via [place] Reinforced to pt importance of making PCP appt-he confirms granddaughter who takes him to appts will do it-declined assistance with making appt Discussed with wife-changes in pt's condition-currently headed to ED for eval of nose bleeding that would not stop  Patient Goals/Self-Care Activities: Avoid SOB and resp triggers-adhere to COPD action plan Make PCP follow up appt Alert MD of any questions or concerns Maintain safety in the home-report no falls or injuries  Follow Up Plan:  Telephone follow up appointment with care management team member scheduled for:  RN CM will monitor disposition and f/u with pt accordingly The patient has been provided with contact information for the care management team and has been advised to call with any health related questions or concerns.  Plan: The patient has been provided with contact information for the care management team and has been advised to call with any health related questions or concerns.  RN CM will monitor disposition and follow up with patient.  Antionette Fairy, RN,BSN,CCM RN Care Manager Transitions of Care  [facility]  Direct Phone: [phone] Toll Free: [phone] Fax: [phone]

## 2023-10-06 NOTE — ED Notes (Signed)
Ivar Drape requesting doctor call her, provider notified.

## 2023-10-12 ENCOUNTER — Other Ambulatory Visit: Payer: Self-pay

## 2023-10-12 NOTE — Patient Outreach (Signed)
Care Management  Transitions of Care Program Transitions of Care Post-discharge week 4   10/12/2023 Name: Eric [name] MRN: [id number] DOB: 16-Mar-1932  Subjective: Eric [name] is a 89 y.o. year old male who is a primary care patient of Sharon Seller, NP. The Care Management team Engaged with patient by telephone to assess and address transitions of care needs.   Consent to Services:  Patient was given information about care management services, agreed to services, and gave verbal consent to participate.   Assessment:   Pt voices he is doing well. He reports no further nosebleeds since last week that warranted a trip to ED for further eval. He continues to take Eliquis and no issues at present. Appetite good. He is up walking & moving around. Denies any RN CM needs or concerns at this time.         SDOH Interventions    Flowsheet Row Telephone from 09/21/2023 in [facility] Care Coordination  SDOH Interventions   Food Insecurity Interventions Intervention Not Indicated  Housing Interventions Intervention Not Indicated  Transportation Interventions Intervention Not Indicated  Utilities Interventions Intervention Not Indicated        Goals Addressed             This Visit's Progress    TOC Care Plan       Current Barriers:  Chronic Disease Management support and education needs related to COPD   RNCM Clinical Goal(s):  Patient will work with the Care Management team over the next 30 days to address Transition of Care Barriers: disease mgmt verbalize understanding of plan for management of COPD as evidenced by adherence to care plan take all medications exactly as prescribed and will call provider for medication related questions as evidenced by completion of Prednisone tapering dose attend all scheduled medical appointments:   as evidenced by attending PCP and cardiology appts demonstrate Ongoing adherence to prescribed treatment plan for COPD as  evidenced by no readmission demonstrate Ongoing health management independence as evidenced by taking meds, completing appts and adhering to COPD plan  through collaboration with RN Care manager, provider, and care team.   Interventions: Evaluation of current treatment plan related to  self management and patient's adherence to plan as established by provider   COPD Interventions:  (Status:  Goal on track:  Yes.) Short Term Goal Assessed resp status and sx mgmt-pt voices no further nosebleeds since last week Reinforced way to manage COPD in the home-pt voices breathing has bene "pretty good"-denies any SOB-wearing oxygen 24/7 at 4L/min via [place] Reinforced to pt importance of making PCP appt-especially since recent ED visit-he continues to voice granddaughter who takes him to appts will do it-declined assistance with making appt   Patient Goals/Self-Care Activities: Avoid SOB and resp triggers-adhere to COPD action plan Make PCP follow up appt Alert MD of any questions or concerns Maintain safety in the home-report no falls or injuries Take meds as prescribed  Follow Up Plan:  Telephone follow up appointment with care management team member scheduled for:  10/19/23-10:30am The patient has been provided with contact information for the care management team and has been advised to call with any health related questions or concerns.          Plan: The patient has been provided with contact information for the care management team and has been advised to call with any health related questions or concerns.  Follow up appt scheduled for 10/19/23-10:30 am.  Antionette Fairy, RN,BSN,CCM  RN Care Manager Transitions of Care  [facility]Population [facility]  Direct Phone: [phone] Toll Free: [phone] Fax: [phone]

## 2023-10-12 NOTE — Patient Instructions (Signed)
Visit Information  Thank you for taking time to visit with me today. Please don't hesitate to contact me if I can be of assistance to you before our next scheduled telephone appointment.  Our next appointment is by telephone on 10/19/23 at 10:30 am  Following is a copy of your care plan:   Goals Addressed             This Visit's Progress    TOC Care Plan       Current Barriers:  Chronic Disease Management support and education needs related to COPD   RNCM Clinical Goal(s):  Patient will work with the Care Management team over the next 30 days to address Transition of Care Barriers: disease mgmt verbalize understanding of plan for management of COPD as evidenced by adherence to care plan take all medications exactly as prescribed and will call provider for medication related questions as evidenced by completion of Prednisone tapering dose attend all scheduled medical appointments:   as evidenced by attending PCP and cardiology appts demonstrate Ongoing adherence to prescribed treatment plan for COPD as evidenced by no readmission demonstrate Ongoing health management independence as evidenced by taking meds, completing appts and adhering to COPD plan  through collaboration with RN Care manager, provider, and care team.   Interventions: Evaluation of current treatment plan related to  self management and patient's adherence to plan as established by provider   COPD Interventions:  (Status:  Goal on track:  Yes.) Short Term Goal Assessed resp status and sx mgmt-pt voices no further nosebleeds since last week Reinforced way to manage COPD in the home-pt voices breathing has bene "pretty good"-denies any SOB-wearing oxygen 24/7 at 4L/min via [place] Reinforced to pt importance of making PCP appt-especially since recent ED visit-he continues to voice granddaughter who takes him to appts will do it-declined assistance with making appt   Patient Goals/Self-Care Activities: Avoid SOB and resp  triggers-adhere to COPD action plan Make PCP follow up appt Alert MD of any questions or concerns Maintain safety in the home-report no falls or injuries Take meds as prescribed  Follow Up Plan:  Telephone follow up appointment with care management team member scheduled for:  10/19/23-10:30am The patient has been provided with contact information for the care management team and has been advised to call with any health related questions or concerns.          The patient verbalized understanding of instructions, educational materials, and care plan provided today and DECLINED offer to receive copy of patient instructions, educational materials, and care plan.   The patient has been provided with contact information for the care management team and has been advised to call with any health related questions or concerns.   Please call the care guide team at [phone] if you need to cancel or reschedule your appointment.   Please call the [place] National Suicide Prevention Lifeline: 856-691-4278 or TTY: 7131573872 TTY (516) 712-3917) to talk to a trained counselor call 1-800-273-TALK (toll free, 24 hour hotline) if you are experiencing a Mental Health or Behavioral Health Crisis or need someone to talk to.  Antionette Fairy, RN,BSN,CCM RN Care Manager Transitions of Care  [facility]  Direct Phone: [phone] Toll Free: [phone] Fax: [phone]

## 2023-10-16 ENCOUNTER — Other Ambulatory Visit: Payer: Self-pay | Admitting: Nurse Practitioner

## 2023-10-16 ENCOUNTER — Telehealth: Payer: Self-pay | Admitting: Primary Care

## 2023-10-16 DIAGNOSIS — J449 Chronic obstructive pulmonary disease, unspecified: Secondary | ICD-10-CM

## 2023-10-16 NOTE — Telephone Encounter (Signed)
Patient's daughter states that patient needs a prescription sent to [facility] so that patient can get a humidifier bottle for oxygen machine.

## 2023-10-18 ENCOUNTER — Telehealth: Payer: Self-pay

## 2023-10-18 NOTE — Patient Outreach (Signed)
Care Management  Transitions of Care Program Transitions of Care Post-discharge Week 5   10/18/2023 Name: Eric [name] MRN: [id number] DOB: 1932/08/16  Subjective: Eric [name] is a 89 y.o. year old male who is a primary care patient of Sharon Seller, NP. The Care Management team Engaged with patient by telephone to assess and address transitions of care needs.   Consent to Services:  Patient has completed 30 day TOC program. Declined need for transfer to longitudinal CCM.  Assessment:   Patient voices he is doing good. States he has had a good week and no new issues or concerns. He voices breathing is controlled/managed-using oxygen as ordered.Appetite has been good. He has been up walking & moving around. He continues to have supportive family that helps him as needed. Denies any RN CM needs or concerns at this time.        SDOH Interventions    Flowsheet Row Telephone from 09/21/2023 in [facility] Care Coordination  SDOH Interventions   Food Insecurity Interventions Intervention Not Indicated  Housing Interventions Intervention Not Indicated  Transportation Interventions Intervention Not Indicated  Utilities Interventions Intervention Not Indicated        Goals Addressed             This Visit's Progress    COMPLETED: TOC Care Plan       Current Barriers:  Chronic Disease Management support and education needs related to COPD   RNCM Clinical Goal(s):  Patient will work with the Care Management team over the next 30 days to address Transition of Care Barriers: disease mgmt verbalize understanding of plan for management of COPD as evidenced by adherence to care plan take all medications exactly as prescribed and will call provider for medication related questions as evidenced by completion of Prednisone tapering dose attend all scheduled medical appointments:   as evidenced by attending PCP and cardiology appts demonstrate Ongoing adherence to  prescribed treatment plan for COPD as evidenced by no readmission demonstrate Ongoing health management independence as evidenced by taking meds, completing appts and adhering to COPD plan  through collaboration with RN Care manager, provider, and care team.   Interventions: Evaluation of current treatment plan related to  self management and patient's adherence to plan as established by provider   COPD Interventions:  (Status:  Goal on track:  Yes.) Short Term Goal Assessed resp status and sx mgmt-pt voices no further nosebleeds, breathing is managed at present with current regimen Discussed oxygen therapy and adding humidifier to help prevent nosebleeds-pt states they did take RN CM advice from last call and contacted DME supplier-they are delivering humidifier to the home this afternoon and will show them how to use it Reinforced to pt importance of making PCP appt-especially since recent ED visit-he continues to voice granddaughter who takes him to appts will do it-declined assistance with making appt - Contacted pt's pharmacy [facility]-[phone] per pt request in getting assistance with albuterol medicine for neb machine. Pt voices his family was not able to pick it up the other day along with his other refills. Spoke with pharmacy staff-advised med was out of stock but is now ready for pick up. Pt will have family go pick up med today.  Patient Goals/Self-Care Activities: Avoid SOB and resp triggers-adhere to COPD action plan Make PCP follow up appt Alert MD of any questions or concerns Maintain safety in the home-report no falls or injuries Take meds as prescribed  Follow Up Plan:  The  patient has been provided with contact information for the care management team and has been advised to call with any health related questions or concerns.  Patient has completed 30 day TOC program. Declined need for ongoing care mgmt calls         Plan: The patient has been provided with contact  information for the care management team and has been advised to call with any health related questions or concerns.  No further follow up at this time.   Antionette Fairy, RN,BSN,CCM RN Care Manager Transitions of Care  [place]  Direct Phone: [phone] Toll Free: [phone] Fax: [phone]

## 2023-10-19 ENCOUNTER — Other Ambulatory Visit: Payer: Self-pay

## 2023-10-26 ENCOUNTER — Telehealth: Payer: Self-pay

## 2023-10-26 NOTE — Telephone Encounter (Signed)
Spoke with patient regarding prior message.Advised patient that I out a order in for [employer] to give patient  humidifier bottle for oxygen machine.  Patient stated he already received that from [employer]. Patient's voice was understanding.Nothing else further needed.

## 2023-11-06 NOTE — Telephone Encounter (Signed)
ordered

## 2023-11-11 ENCOUNTER — Other Ambulatory Visit: Payer: Self-pay | Admitting: Nurse Practitioner

## 2023-11-11 DIAGNOSIS — E782 Mixed hyperlipidemia: Secondary | ICD-10-CM

## 2023-11-11 DIAGNOSIS — R7989 Other specified abnormal findings of blood chemistry: Secondary | ICD-10-CM

## 2023-11-30 DIAGNOSIS — R208 Other disturbances of skin sensation: Secondary | ICD-10-CM | POA: Diagnosis not present

## 2023-11-30 DIAGNOSIS — L570 Actinic keratosis: Secondary | ICD-10-CM | POA: Diagnosis not present

## 2023-11-30 DIAGNOSIS — C4442 Squamous cell carcinoma of skin of scalp and neck: Secondary | ICD-10-CM | POA: Diagnosis not present

## 2023-11-30 DIAGNOSIS — X32XXXA Exposure to sunlight, initial encounter: Secondary | ICD-10-CM | POA: Diagnosis not present

## 2023-11-30 DIAGNOSIS — D485 Neoplasm of uncertain behavior of skin: Secondary | ICD-10-CM | POA: Diagnosis not present

## 2023-11-30 DIAGNOSIS — L821 Other seborrheic keratosis: Secondary | ICD-10-CM | POA: Diagnosis not present

## 2023-11-30 DIAGNOSIS — D1801 Hemangioma of skin and subcutaneous tissue: Secondary | ICD-10-CM | POA: Diagnosis not present

## 2023-11-30 DIAGNOSIS — R58 Hemorrhage, not elsewhere classified: Secondary | ICD-10-CM | POA: Diagnosis not present

## 2023-12-01 ENCOUNTER — Other Ambulatory Visit: Payer: Self-pay | Admitting: Nurse Practitioner

## 2023-12-15 ENCOUNTER — Ambulatory Visit
Admission: RE | Admit: 2023-12-15 | Discharge: 2023-12-15 | Disposition: A | Discharge status: Home / Self Care | Payer: PPO | Source: Ambulatory Visit | Attending: Nurse Practitioner | Admitting: Nurse Practitioner

## 2023-12-15 ENCOUNTER — Ambulatory Visit: Payer: PPO | Admitting: Nurse Practitioner

## 2023-12-15 ENCOUNTER — Encounter: Payer: Self-pay | Admitting: Nurse Practitioner

## 2023-12-15 ENCOUNTER — Other Ambulatory Visit
Admission: RE | Admit: 2023-12-15 | Discharge: 2023-12-15 | Disposition: A | Discharge status: Home / Self Care | Payer: PPO | Source: Ambulatory Visit | Attending: Nurse Practitioner | Admitting: Nurse Practitioner

## 2023-12-15 VITALS — BP 116/60 | HR 62 | Temp 97.6°F | Ht 66.0 in | Wt 188.0 lb

## 2023-12-15 DIAGNOSIS — R051 Acute cough: Secondary | ICD-10-CM | POA: Diagnosis not present

## 2023-12-15 DIAGNOSIS — R0602 Shortness of breath: Secondary | ICD-10-CM | POA: Diagnosis not present

## 2023-12-15 DIAGNOSIS — J441 Chronic obstructive pulmonary disease with (acute) exacerbation: Secondary | ICD-10-CM

## 2023-12-15 DIAGNOSIS — J9611 Chronic respiratory failure with hypoxia: Secondary | ICD-10-CM

## 2023-12-15 DIAGNOSIS — J069 Acute upper respiratory infection, unspecified: Secondary | ICD-10-CM | POA: Diagnosis not present

## 2023-12-15 DIAGNOSIS — J9811 Atelectasis: Secondary | ICD-10-CM | POA: Diagnosis not present

## 2023-12-15 DIAGNOSIS — R062 Wheezing: Secondary | ICD-10-CM | POA: Diagnosis not present

## 2023-12-15 LAB — BASIC METABOLIC PANEL
Anion gap: 14 (ref 5–15)
BUN: 26 mg/dL — ABNORMAL HIGH (ref 8–23)
CO2: 21 mmol/L — ABNORMAL LOW (ref 22–32)
Calcium: 8.8 mg/dL — ABNORMAL LOW (ref 8.9–10.3)
Chloride: 104 mmol/L (ref 98–111)
Creatinine, Ser: 1 mg/dL (ref 0.61–1.24)
GFR, Estimated: >60 mL/min (ref >60)
Glucose, Bld: 79 mg/dL (ref 70–99)
Potassium: 4.1 mmol/L (ref 3.5–5.1)
Sodium: 139 mmol/L (ref 135–145)

## 2023-12-15 MED ORDER — BREZTRI AEROSPHERE 160-9-4.8 MCG/ACT IN AERO: 2.0000 | INHALATION_SPRAY | Freq: Two times a day (BID) | RESPIRATORY_TRACT | 0 refills | Status: DC

## 2023-12-15 MED ORDER — BREZTRI AEROSPHERE 160-9-4.8 MCG/ACT IN AERO
2.0000 | INHALATION_SPRAY | Freq: Two times a day (BID) | RESPIRATORY_TRACT | 5 refills | Status: DC
Start: 2023-12-15 — End: 2024-01-10

## 2023-12-15 MED ORDER — BREZTRI AEROSPHERE 160-9-4.8 MCG/ACT IN AERO
2.0000 | INHALATION_SPRAY | Freq: Two times a day (BID) | RESPIRATORY_TRACT | 5 refills | Status: DC
Start: 2023-12-15 — End: 2023-12-15

## 2023-12-15 MED ORDER — BREZTRI AEROSPHERE 160-9-4.8 MCG/ACT IN AERO: 2.0000 | INHALATION_SPRAY | Freq: Two times a day (BID) | RESPIRATORY_TRACT | Status: DC

## 2023-12-15 MED ORDER — PREDNISONE 10 MG PO TABS
ORAL_TABLET | ORAL | 0 refills | Status: DC
Start: 2023-12-15 — End: 2024-04-03

## 2023-12-15 NOTE — Assessment & Plan Note (Signed)
 See above

## 2023-12-15 NOTE — Assessment & Plan Note (Signed)
 No exertional hypoxia. Advised to monitor for goal >88-90%. Continue 4 lpm supplemental O2 at night.

## 2023-12-15 NOTE — Progress Notes (Signed)
 @Patient  ID: Eric [name], male    DOB: 1932/03/04, 89 y.o.   MRN: [id number]  Chief Complaint  Patient presents with   Follow-up    Cough, shortness of breath on exertion and wheezing.     Referring provider: Caro Harlene POUR, NP  HPI: 89 year old male, former smoker followed for COPD mixed type and chronic respiratory failure.  He is a former patient of Dr. Magdaleno and last seen in office 08/25/2023.  Past medical history significant for anxiety, diastolic CHF, depression, dysphagia, DVT, PAF on Eliquis , hypertension, history of ischemic colitis, HLD.  TEST/EVENTS:  12/25/2018 PFT: FVC 59, FEV1 54, ratio 64, TLC 60.  Unable to complete DLCO.  Mixed obstructive and restrictive disease without reversibility 05/22/2020 ONO 2 lpm O2: SpO2 low 64%, spent 57 min 8 sec </88% 08/23/2021 HRCT chest: Ascending aorta 4 cm, atherosclerosis, PA 3.6 cm.  Calcified left hilar lymph node.  Diffuse bronchial wall thickening.  Scattered plugs.  Bandlike scarring and dependent GGO  08/25/2023: OV with Dr. Shellia. Has cough with chest congestion.  Not always bringing up phlegm.  Has persistent leg swelling.  Uses oxygen  at night.  Not always using during the day.  Usually wakes up around 3 AM.  Can typically fall back to sleep on his own.  Uses albuterol  few times a day.  Has noticed a growth on his scalp.  Continue Advair.  Arrange for flutter valve.  Other inhalers were too expensive.  Bronchial hygiene.  Using 4 L supplemental oxygen  at night.  Referred to dermatology for concerning scalp lesion.  12/15/2023: Today - acute Patient presents today for acute visit with his granddaughter.  He has been having difficulties with an increased cough over the last 4 days or so.  He is not producing any phlegm.  He does feel little more short winded than his usual but he is still able to get around with his walker and talk in complete sentences.  Not noticing any wheezing.  He denies any fevers, chills, hemoptysis, increased leg  swelling, orthopnea, chest discomfort/pain, palpitations.  He is not having any significant sinus symptoms.  He does tell me that his wife is also sick right now and she does have sinus congestion and drainage.  Neither of them have completed any viral testing.  He is eating and drinking well.  He does use his Advair twice a day but he does not really like the dry powder.  Still uses his albuterol  a few times a day.  He has tried Trelegy in the past but it was expensive.  Did work pretty well for him though from what he could remember.  He does have a different insurance plan this year. He was hospitalized in October 2020 for for acute on chronic respiratory failure secondary to multifocal pneumonia, AECOPD and acute CHF exacerbation.  He was treated with antibiotics and steroids.  His imaging was concerning for aspiration pneumonia.  He did have a swallow study that demonstrated some mild oropharyngeal dysphagia but no overt aspiration.  He was encouraged to slow down while eating and reinforced on good airway protection.  He had felt back to his normal up until the last few days.  Did not have any repeat imaging that he can remember. He was diagnosed with skin cancer by a local dermatologist.  They removed multiple skin lesions on his scalp.  He does think that he was diagnosed with melanoma.  He is currently waiting on a specialty appointment with [facility].  Has not noticed any lesions elsewhere.  Allergies  Allergen Reactions   Morphine And Codeine Shortness Of Breath   Percocet [Oxycodone-Acetaminophen ] Shortness Of Breath   Valium Shortness Of Breath    Immunization History  Administered Date(s) Administered   Fluad Quad(high Dose 65+) 10/11/2019, 11/02/2020, 08/13/2021, 09/23/2022   Fluad Trivalent(High Dose 65+) 09/20/2023   Influenza Whole 09/11/1997, 10/11/2007, 10/06/2010   Influenza, High Dose Seasonal PF 09/01/2017, 08/30/2018   Influenza,inj,Quad PF,6+ Mos 10/03/2013, 09/15/2014, 09/04/2015    PFIZER(Purple Top)SARS-COV-2 Vaccination 01/18/2020, 02/08/2020   Pneumococcal Conjugate-13 01/19/2015   Pneumococcal Polysaccharide-23 03/05/2002   Td 01/19/2003   Tdap 10/14/2013   Zoster, Live 10/14/2013    Past Medical History:  Diagnosis Date   Anxiety    Arthritis    Atypical chest pain    a. 05/2017 MV: no ischemia, EF 79%.   Chest pain 10/20/2017   Chronic diastolic CHF (congestive heart failure) (HCC)    a. 01/2011 Echo: EF 50-55%, gr1 DD, mild AI, nl RV fxn, mild TR/PR; b. 10/2017 Echo: EF 60-65%, mild LVH, gr2 DD.   DDD (degenerative disc disease), cervical    Depressive disorder, not elsewhere classified    Difficult intubation    Dysphagia, oral phase    Dyspnea    Edema    Gallstones    a. Symptomatic - s/p lap chole 05/2018.   History of DVT (deep vein thrombosis)    History of kidney stones    Hypertension    Hypoxemia    Impacted cerumen    Ischemic colitis (HCC)    a. 02/2018 GIB - colonoscopy w/ isch colitis. Anticoagulation resumed.   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 a. 01/2011 in setting of post-op complications including aspiration pna;  b. CHA2DS2VASc = 4--> Amio/Eliquis .   Pneumonia 03/06/2003   Spinal stenosis, unspecified region other than cervical    Squamous cell carcinoma of skin of trunk, except scrotum    skin cancer of shoulder   Syncope    a. 10/2017-->Event monitor: RSR, rare PACs/PVCs.   Thoracic or lumbosacral neuritis or radiculitis, unspecified     Tobacco History: Social History   Tobacco Use  Smoking Status Former   Types: Cigarettes  Smokeless Tobacco Never  Tobacco Comments   stopped in 20's   Counseling given: Not Answered Tobacco  comments: stopped in 20's   Outpatient Medications Prior to Visit  Medication Sig Dispense Refill   albuterol  (PROVENTIL ) (2.5 MG/3ML) 0.083% nebulizer solution TAKE 3MLS(1 VIAL) VIA NEBULIZER EVERY 6 HOURS AS NEEDED 75 mL 4   cetirizine  (ZYRTEC ) 10 MG tablet TAKE 1 TABLET(10 MG) BY MOUTH DAILY 90 tablet 3   Cholecalciferol  (VITAMIN D ) 1000 UNITS capsule Take one tablet once daily 90 capsule 3   ELIQUIS  5 MG TABS tablet TAKE 1 TABLET BY MOUTH TWICE DAILY 180 tablet 3   fluticasone  (FLONASE ) 50 MCG/ACT nasal spray Place 1 spray into both nostrils daily as needed for allergies or rhinitis. 16 g 3   furosemide  (LASIX ) 40 MG tablet Take 1 tablet (40 mg total) by mouth daily. 30 tablet 1   gabapentin  (NEURONTIN ) 300 MG capsule TAKE 1  CAPSULE(300 MG) BY MOUTH TWICE DAILY 180 capsule 1   guaiFENesin -dextromethorphan  (ROBITUSSIN DM) 100-10 MG/5ML syrup Take 10 mLs by mouth every 4 (four) hours as needed for cough. 118 mL 0   levothyroxine  (SYNTHROID ) 25 MCG tablet TAKE 1 TABLET BY MOUTH EVERY DAY BEFORE BREAKFAST AS DIRECTED 90 tablet 1   metoprolol  tartrate (LOPRESSOR ) 25 MG tablet TAKE 1/2 TABLET(12.5 MG) BY MOUTH TWICE DAILY 90 tablet 1   OXYGEN  Inhale 4 L into the lungs at bedtime.      pantoprazole  (PROTONIX ) 40 MG tablet Take 40 mg by mouth at bedtime.     pravastatin  (PRAVACHOL ) 40 MG tablet TAKE 1 TABLET BY MOUTH EVERY DAY 90 tablet 0   fluticasone -salmeterol (ADVAIR DISKUS) 250-50 MCG/ACT AEPB Inhale 1 puff into the lungs in the morning and at bedtime. 60 each 5   No facility-administered medications prior to visit.     Review of Systems:   Constitutional: No weight loss or gain, night sweats, fevers, chills, fatigue, or lassitude. HEENT: No headaches, difficulty swallowing, tooth/dental problems, or sore throat. No sneezing, itching, ear ache, nasal congestion, or post nasal drip CV:  No chest pain, orthopnea, PND, swelling in lower extremities, anasarca, dizziness, palpitations,  syncope Resp: + shortness of breath with exertion; dry cough. No excess mucus or change in color of mucus. No hemoptysis. No wheezing.  No chest wall deformity GI:  No heartburn, indigestion, abdominal pain, nausea, vomiting, diarrhea, change in bowel habits, loss of appetite, bloody stools.  GU: No dysuria, change in color of urine, urgency or frequency.  No flank pain, no hematuria  Skin: No rashes. +skin cancer MSK:  No increased joint pain or swelling.   Neuro: No dizziness or lightheadedness.  Psych: No depression or anxiety. Mood stable.     Physical Exam:  BP 116/60 (BP Location: Right Arm, Patient Position: Sitting, Cuff Size: Normal)   Pulse 62   Temp 97.6 F (36.4 C) (Temporal)   Ht 5' 6 (1.676 m)   Wt 188 lb (85.3 kg)   SpO2 96%   BMI 30.34 kg/m   GEN: Pleasant, interactive, well-kempt; elderly; in no acute distress HEENT:  Normocephalic and atraumatic. PERRLA. Sclera white. Nasal turbinates pink, moist and patent bilaterally. Clear rhinorrhea present. Oropharynx pink and moist, without exudate or edema. No lesions, ulcerations, or postnasal drip.  NECK:  Supple w/ fair ROM. No JVD present. Normal carotid impulses w/o bruits. Thyroid  symmetrical with no goiter or nodules palpated. No lymphadenopathy.   CV: Irregular rhythm, rate controlled, no m/r/g, no peripheral edema. Pulses intact, +2 bilaterally. No cyanosis, pallor or clubbing. PULMONARY:  Unlabored, regular breathing. Scattered wheezes bilaterally A&P. No accessory muscle use.  GI: BS present and normoactive. Soft, non-tender to palpation. No organomegaly or masses detected.  MSK: No erythema, warmth or tenderness. Cap refil <2 sec all extrem. No deformities or joint swelling noted.  Neuro: A/Ox3. No focal deficits noted.   Skin: Warm. No rashes. multiple small skin lesions on scalp; no exudate  Psych: Normal affect and behavior. Judgement and thought content appropriate.     Lab Results:  CBC    Component  Value Date/Time   WBC 9.7 09/20/2023 0540   RBC 4.89 09/20/2023 0540   HGB 13.3 10/06/2023 1030   HGB 14.5 03/29/2017 1503   HCT 39.0 10/06/2023 1030   HCT 43.4 03/29/2017 1503   PLT 218 09/20/2023 0540   PLT 210 03/29/2017 1503   MCV 96.1 09/20/2023 0540   MCV 97 03/29/2017 1503  MCH 31.3 09/20/2023 0540   MCHC 32.6 09/20/2023 0540   RDW 13.2 09/20/2023 0540   RDW 13.7 03/29/2017 1503   LYMPHSABS 2.0 09/20/2023 0540   LYMPHSABS 2.4 05/16/2016 1035   MONOABS 0.5 09/20/2023 0540   EOSABS 0.0 09/20/2023 0540   EOSABS 0.1 05/16/2016 1035   BASOSABS 0.0 09/20/2023 0540   BASOSABS 0.0 05/16/2016 1035    BMET    Component Value Date/Time   NA 139 12/15/2023 1323   NA 142 05/18/2017 1520   K 4.1 12/15/2023 1323   CL 104 12/15/2023 1323   CO2 21 (L) 12/15/2023 1323   GLUCOSE 79 12/15/2023 1323   BUN 26 (H) 12/15/2023 1323   BUN 23 07/12/2017 1442   CREATININE 1.00 12/15/2023 1323   CREATININE 1.13 06/23/2023 0952   CALCIUM 8.8 (L) 12/15/2023 1323   GFRNONAA >60 12/15/2023 1323   GFRNONAA 43 (L) 07/10/2020 1112   GFRAA 50 (L) 07/10/2020 1112    BNP    Component Value Date/Time   BNP 103.8 (H) 09/14/2023 1607   BNP 53 05/01/2019 1011     Imaging:  DG Chest 2 View Result Date: 12/15/2023 CLINICAL DATA:  Acute cough. Shortness of breath on exertion with some wheezing EXAM: CHEST - 2 VIEW COMPARISON:  X-ray 09/14/2023. FINDINGS: No pneumothorax, effusion or edema. Normal cardiopericardial silhouette. Films are underinflated. Mild basilar atelectasis. Degenerative changes of the spine. Fixation hardware along the cervical spine at the edge of the imaging field. Calcified left hilar lung nodules. IMPRESSION: Underinflation. Mild basilar atelectasis. No pneumothorax or consolidation. Electronically Signed   By: Ranell Bring M.D.   On: 12/15/2023 14:37    Administration History     None           No data to display          No results found for:  NITRICOXIDE      Assessment & Plan:   COPD with acute exacerbation (HCC) AECOPD likely secondary to viral illness given known sick exposures. Unable to COVID test in office. Advised to obtain home test and notify if positive. Will send rx for prednisone  taper. Step up maintenance regimen to triple therapy with Breztri . Provided with samples and new rx sent. Teachback performed and side effect profile reviewed. Will call if unaffordable. Hold off on antimicrobial therapy at this time given lack of sputum production and viral etiology. Obtain CXR to rule out superimposed infection and f/u on previous pna to ensure resolution. If changes persist, will obtain CT chest once acute symptoms resolved. Action plan in place.   Patient Instructions  Stop Advair. Start Breztri  2 puffs Twice daily. Brush tongue and rinse mouth afterwards  Continue Albuterol  inhaler 2 puffs or 3 mL neb every 6 hours as needed for shortness of breath or wheezing. Notify if symptoms persist despite rescue inhaler/neb use. Use neb treatments 2-3 times a day until symptoms improve Continue flonase  nasal spray 2 sprays each nostril daily Continue supplemental oxygen  4 lpm at night. Monitor during the day for goal >88-90%  Prednisone  taper. 4 tabs for 2 days, then 3 tabs for 2 days, 2 tabs for 2 days, then 1 tab for 2 days, then stop. Take in AM with food Mucinex  DM or Robitussin DM over the counter as needed for cough  COVID test today and let me know if positive  Labs and x ray today  Follow up in 3-4 weeks with new pulmonologist or Katie Ermias Tomeo,NP. If symptoms do not improve or worsen,  please contact office for sooner follow up or seek emergency care.    Chronic respiratory failure with hypoxia (HCC) No exertional hypoxia. Advised to monitor for goal >88-90%. Continue 4 lpm supplemental O2 at night.   URI (upper respiratory infection) See above   Advised if symptoms do not improve or worsen, to please contact office  for sooner follow up or seek emergency care.   I spent 38 minutes of dedicated to the care of this patient on the date of this encounter to include pre-visit review of records, face-to-face time with the patient discussing conditions above, post visit ordering of testing, clinical documentation with the electronic health record, making appropriate referrals as documented, and communicating necessary findings to members of the patients care team.  Comer LULLA Rouleau, NP 12/15/2023  Pt aware and understands NP's role.

## 2023-12-15 NOTE — Assessment & Plan Note (Signed)
 AECOPD likely secondary to viral illness given known sick exposures. Unable to COVID test in office. Advised to obtain home test and notify if positive. Will send rx for prednisone  taper. Step up maintenance regimen to triple therapy with Breztri . Provided with samples and new rx sent. Teachback performed and side effect profile reviewed. Will call if unaffordable. Hold off on antimicrobial therapy at this time given lack of sputum production and viral etiology. Obtain CXR to rule out superimposed infection and f/u on previous pna to ensure resolution. If changes persist, will obtain CT chest once acute symptoms resolved. Action plan in place.   Patient Instructions  Stop Advair. Start Breztri  2 puffs Twice daily. Brush tongue and rinse mouth afterwards  Continue Albuterol  inhaler 2 puffs or 3 mL neb every 6 hours as needed for shortness of breath or wheezing. Notify if symptoms persist despite rescue inhaler/neb use. Use neb treatments 2-3 times a day until symptoms improve Continue flonase  nasal spray 2 sprays each nostril daily Continue supplemental oxygen  4 lpm at night. Monitor during the day for goal >88-90%  Prednisone  taper. 4 tabs for 2 days, then 3 tabs for 2 days, 2 tabs for 2 days, then 1 tab for 2 days, then stop. Take in AM with food Mucinex  DM or Robitussin DM over the counter as needed for cough  COVID test today and let me know if positive  Labs and x ray today  Follow up in 3-4 weeks with new pulmonologist or Katie Mcclellan Demarais,NP. If symptoms do not improve or worsen, please contact office for sooner follow up or seek emergency care.

## 2023-12-15 NOTE — Patient Instructions (Addendum)
 Stop Advair. Start Breztri  2 puffs Twice daily. Brush tongue and rinse mouth afterwards  Continue Albuterol  inhaler 2 puffs or 3 mL neb every 6 hours as needed for shortness of breath or wheezing. Notify if symptoms persist despite rescue inhaler/neb use. Use neb treatments 2-3 times a day until symptoms improve Continue flonase  nasal spray 2 sprays each nostril daily Continue supplemental oxygen  4 lpm at night. Monitor during the day for goal >88-90%  Prednisone  taper. 4 tabs for 2 days, then 3 tabs for 2 days, 2 tabs for 2 days, then 1 tab for 2 days, then stop. Take in AM with food Mucinex  DM or Robitussin DM over the counter as needed for cough  COVID test today and let me know if positive  Labs and x ray today  Follow up in 3-4 weeks with new pulmonologist or Katie Deann Mclaine,NP. If symptoms do not improve or worsen, please contact office for sooner follow up or seek emergency care.

## 2023-12-20 ENCOUNTER — Ambulatory Visit: Payer: PPO | Admitting: Podiatry

## 2023-12-20 ENCOUNTER — Encounter: Payer: Self-pay | Admitting: Podiatry

## 2023-12-20 VITALS — Ht 66.0 in | Wt 188.0 lb

## 2023-12-20 DIAGNOSIS — M79675 Pain in left toe(s): Secondary | ICD-10-CM | POA: Diagnosis not present

## 2023-12-20 DIAGNOSIS — D689 Coagulation defect, unspecified: Secondary | ICD-10-CM

## 2023-12-20 DIAGNOSIS — M79674 Pain in right toe(s): Secondary | ICD-10-CM | POA: Diagnosis not present

## 2023-12-20 DIAGNOSIS — B351 Tinea unguium: Secondary | ICD-10-CM | POA: Diagnosis not present

## 2023-12-20 NOTE — Progress Notes (Signed)
 This patient returns to my office for at risk foot care.  This patient requires this care by a professional since this patient will be at risk due to having coagulation defect.  This patient is unable to cut nails himself since the patient cannot reach his nails.These nails are painful walking and wearing shoes.  This patient has not been seen for over 11 months. This patient presents for at risk foot care today.  General Appearance  Alert, conversant and in no acute stress.  Vascular  Dorsalis pedis and posterior tibial  pulses are  weakly palpable  bilaterally.  Capillary return is within normal limits  bilaterally. Temperature is within normal limits  bilaterally.  Neurologic  Senn-Weinstein monofilament wire test within normal limits  bilaterally. Muscle power within normal limits bilaterally.  Nails Thick disfigured discolored nails with subungual debris  from hallux to fifth toes bilaterally. No evidence of bacterial infection or drainage bilaterally.  Orthopedic  No limitations of motion  feet .  No crepitus or effusions noted.  Hammer toes  B/L.SABRA  Skin  normotropic skin with no porokeratosis noted bilaterally.  No signs of infections or ulcers noted.     Onychomycosis  Pain in right toes  Pain in left toes  Consent was obtained for treatment procedures.   Mechanical debridement of nails 1-5  bilaterally performed with a nail nipper.  Filed with dremel without incident.    Return office visit   3 months                   Told patient to return for periodic foot care and evaluation due to potential at risk complications.   Cordella Bold DPM

## 2023-12-28 ENCOUNTER — Other Ambulatory Visit: Payer: Self-pay | Admitting: Nurse Practitioner

## 2023-12-28 DIAGNOSIS — R7989 Other specified abnormal findings of blood chemistry: Secondary | ICD-10-CM

## 2023-12-28 DIAGNOSIS — E782 Mixed hyperlipidemia: Secondary | ICD-10-CM

## 2023-12-28 NOTE — Telephone Encounter (Signed)
Spoke with patients daughter in law and informed her that patient is due for an appointment with fasting labs. Synetta Fail plans to check with his granddaughter to see when she can bring him and call the office to schedule

## 2023-12-29 ENCOUNTER — Telehealth: Payer: Self-pay

## 2023-12-29 NOTE — Telephone Encounter (Signed)
Currently waiting on a fax for patient oxygen re certification. I advised [employer] that I have not received it at this moment

## 2024-01-10 ENCOUNTER — Telehealth: Payer: Self-pay

## 2024-01-10 ENCOUNTER — Ambulatory Visit: Payer: PPO | Admitting: Pulmonary Disease

## 2024-01-10 ENCOUNTER — Encounter: Payer: Self-pay | Admitting: Pulmonary Disease

## 2024-01-10 VITALS — BP 122/78 | HR 66 | Temp 97.3°F | Ht 66.0 in | Wt 193.4 lb

## 2024-01-10 DIAGNOSIS — J4489 Other specified chronic obstructive pulmonary disease: Secondary | ICD-10-CM

## 2024-01-10 DIAGNOSIS — J439 Emphysema, unspecified: Secondary | ICD-10-CM

## 2024-01-10 MED ORDER — TRELEGY ELLIPTA 100-62.5-25 MCG/ACT IN AEPB: 1.0000 | INHALATION_SPRAY | Freq: Every day | RESPIRATORY_TRACT | 3 refills | Status: AC

## 2024-01-10 MED ORDER — TRELEGY ELLIPTA 100-62.5-25 MCG/ACT IN AEPB: 1.0000 | INHALATION_SPRAY | Freq: Every day | RESPIRATORY_TRACT | Status: DC

## 2024-01-10 NOTE — Telephone Encounter (Signed)
error

## 2024-01-10 NOTE — Telephone Encounter (Deleted)
Error

## 2024-01-10 NOTE — Telephone Encounter (Addendum)
Spoke with on [employer] Donalynn Furlong on yesterday and Melody this morning because the information on the paper work was incorrect and they resend another form [employer] to be completed and signed Sharon Seller, NP to faxed.

## 2024-01-14 NOTE — Progress Notes (Signed)
Synopsis: Referred in by Sharon Seller, NP   Subjective:   PATIENT ID: Eric [name] GENDER: male DOB: November 01, 1932, MRN: [id number]  Chief Complaint  Patient presents with   Follow-up    SOB. Occasional wheezing. Dry cough.    HPI Eric [name] is a pleasant 89 year old male patient with a past medical history of COPD with chronic bronchitis presenting today to the pulmonary clinic to establish care.   He is a previous patient of Dr. Craige Cotta and was being managed for COPD was maintained on Advair. He was last seen in Sept 2024.   He was admitted to [facility] in October 2024 for a COPD exacerbation.   Current symptoms include dyspnea on exertion, chronic cough with sputum production.   PFTs 12/2018 - Moderate Obstructive defect with significant response to bronchodilators. Moderate restrictive lund disease.   CT chest wo contrast 09/2023 - Without any clear evidence of ILD. Some infiltrates at the right base.   DG swallow eval 09/2023 - No clear airway invasion. Esophageal sweep showed diffuse residue and changes to esophageal tract due to curvature of spine. Suspect cough Loa be related to esophageal stasis than any aspiration.     Family history:His family history includes Atrial fibrillation in his brother; Cancer in an other family member; Heart disease in his brother; Other in his mother; Stroke in his father and paternal grandfather.   Social history:  He  reports that he has quit smoking. His smoking use included cigarettes. He has never used smokeless tobacco. He reports that he does not drink alcohol and does not use drugs.   ROS All systems were reviewed and are negative except for the above.  Objective:   Vitals:   01/10/24 1428  BP: 122/78  Pulse: 66  Temp: (!) 97.3 F (36.3 C)  SpO2: 92%  Weight: 193 lb 6.4 oz (87.7 kg)  Height: 5\' 6"  (1.676 m)   92% on RA BMI Readings from Last 3 Encounters:  01/10/24 31.22 kg/m  12/20/23 30.34 kg/m  12/15/23 30.34 kg/m   Wt  Readings from Last 3 Encounters:  01/10/24 193 lb 6.4 oz (87.7 kg)  12/20/23 188 lb (85.3 kg)  12/15/23 188 lb (85.3 kg)    Physical Exam GEN: NAD, Healthy Appearing HEENT: Supple Neck, Reactive Pupils, EOMI  CVS: Normal S1, Normal S2, RRR, No murmurs or ES appreciated  Lungs: Poor air movement with bibasilar crackles.  Abdomen: Soft, non tender, non distended, + BS  Extremities: Warm and well perfused, No edema  Skin: No suspicious lesions appreciated  Psych: Normal Affect  Ancillary Information   CBC    Component Value Date/Time   WBC 9.7 09/20/2023 0540   RBC 4.89 09/20/2023 0540   HGB 13.3 10/06/2023 1030   HGB 14.5 03/29/2017 1503   HCT 39.0 10/06/2023 1030   HCT 43.4 03/29/2017 1503   PLT 218 09/20/2023 0540   PLT 210 03/29/2017 1503   MCV 96.1 09/20/2023 0540   MCV 97 03/29/2017 1503   MCH 31.3 09/20/2023 0540   MCHC 32.6 09/20/2023 0540   RDW 13.2 09/20/2023 0540   RDW 13.7 03/29/2017 1503   LYMPHSABS 2.0 09/20/2023 0540   LYMPHSABS 2.4 05/16/2016 1035   MONOABS 0.5 09/20/2023 0540   EOSABS 0.0 09/20/2023 0540   EOSABS 0.1 05/16/2016 1035   BASOSABS 0.0 09/20/2023 0540   BASOSABS 0.0 05/16/2016 1035        No data to display  Assessment & Plan:  Eric [name] is a pleasant 89 year old male patient with a past medical history of COPD with chronic bronchitis presenting today to the pulmonary clinic to establish care.  #COPD with Stage II COPD Gp B CAR > 10 mmrc > 2  #CHRF on 2 to 4L [place] at night   []  Start fluticasone-Umeclidinum-Vilanterol [Trelegy] 100 1 puff daily.  []  C/w Albuterol as needed.  []  Encourage Flutter Valve use.  []  Will plan on starting Ensifentrine or chronic use of azithromycin  []  Discussed Referral to pulmonary rehab but currently uninterested.   #Dysphagia Appears to be in the setting of Esophageal dysmotility. No signs of aspiration.   Return in about 3 months (around 04/09/2024).  I spent 60 minutes caring for this  patient today, including preparing to see the patient, obtaining a medical history , reviewing a separately obtained history, performing a medically appropriate examination and/or evaluation, counseling and educating the patient/family/caregiver, ordering medications, tests, or procedures, documenting clinical information in the electronic health record, and independently interpreting results (not separately reported/billed) and communicating results to the patient/family/caregiver  Janann Colonel, MD [facility] 01/14/2024 12:59 PM

## 2024-01-22 ENCOUNTER — Telehealth: Payer: Self-pay | Admitting: Pulmonary Disease

## 2024-01-22 DIAGNOSIS — I5033 Acute on chronic diastolic (congestive) heart failure: Secondary | ICD-10-CM

## 2024-01-22 DIAGNOSIS — J449 Chronic obstructive pulmonary disease, unspecified: Secondary | ICD-10-CM

## 2024-01-22 NOTE — Telephone Encounter (Signed)
 PT calling to ask about In Home Therapy Dr. Lucina Sabal mentioned to him at the last appt. States he wanted us  to know he has Manufacturing engineer. Please call PT to advise @ [phone] (This is his granddaughters #. We can call her.)

## 2024-01-25 ENCOUNTER — Emergency Department (HOSPITAL_COMMUNITY)
Admission: EM | Admit: 2024-01-25 | Discharge: 2024-01-26 | Disposition: A | Discharge status: Home / Self Care | Payer: PPO | Attending: Emergency Medicine | Admitting: Emergency Medicine

## 2024-01-25 ENCOUNTER — Other Ambulatory Visit: Payer: Self-pay

## 2024-01-25 ENCOUNTER — Encounter (HOSPITAL_COMMUNITY): Payer: Self-pay

## 2024-01-25 DIAGNOSIS — R040 Epistaxis: Secondary | ICD-10-CM | POA: Insufficient documentation

## 2024-01-25 DIAGNOSIS — I5032 Chronic diastolic (congestive) heart failure: Secondary | ICD-10-CM | POA: Insufficient documentation

## 2024-01-25 DIAGNOSIS — I110 Hypertensive heart disease with heart failure: Secondary | ICD-10-CM | POA: Insufficient documentation

## 2024-01-25 DIAGNOSIS — Z87891 Personal history of nicotine dependence: Secondary | ICD-10-CM | POA: Insufficient documentation

## 2024-01-25 LAB — CBC
HCT: 42 % (ref 39.0–52.0)
Hemoglobin: 13.1 g/dL (ref 13.0–17.0)
MCH: 31.2 pg (ref 26.0–34.0)
MCHC: 31.2 g/dL (ref 30.0–36.0)
MCV: 100 fL (ref 80.0–100.0)
Platelets: 190 10*3/uL (ref 150–400)
RBC: 4.2 MIL/uL — ABNORMAL LOW (ref 4.22–5.81)
RDW: 13.7 % (ref 11.5–15.5)
WBC: 7 10*3/uL (ref 4.0–10.5)
nRBC: 0 % (ref 0.0–0.2)

## 2024-01-25 MED ORDER — OXYMETAZOLINE HCL 0.05 % NA SOLN
1.0000 | Freq: Once | NASAL | Status: AC
  Administered 2024-01-26: 1 via NASAL
  Filled 2024-01-25: qty 30

## 2024-01-25 NOTE — ED Triage Notes (Addendum)
Pt BIB [facility] from home for epistaxis. Pt reports 3 separate episodes today after coughing with the first two being "light bleeding". Most recent episode began at 78. Bleeding controlled at this time. Pt on eliquis. 2 doses of afrin with EMS 142/90 63HR 18RR 93%RA

## 2024-01-26 NOTE — ED Provider Triage Note (Signed)
Emergency Medicine Provider Triage Evaluation Note  Eric [name] , a 89 y.o. male  was evaluated in triage.  Pt complains of epistaxis. Patient reports 3 episodes of nose bleeding from left nare. EMS administered afrin with resolution of bleed. Patient is on Eliquis.   Review of Systems  Positive:  Negative:   Physical Exam  BP (!) 177/77 (BP Location: Left Arm)   Pulse 64   Temp 98.6 F (37 C) (Oral)   Resp 20   Ht 5\' 6"  (1.676 m)   Wt 87.5 kg   SpO2 92%   BMI 31.15 kg/m  Gen:   Awake, no distress   Resp:  Normal effort  MSK:   Moves extremities without difficulty  Other:    Medical Decision Making  Medically screening exam initiated at 12:10 AM.  Appropriate orders placed.  Eric [name] was informed that the remainder of the evaluation will be completed by another provider, this initial triage assessment does not replace that evaluation, and the importance of remaining in the ED until their evaluation is complete.     Darrick Grinder, PA-C 01/26/24 0011

## 2024-01-26 NOTE — Discharge Instructions (Signed)
You were evaluated in the Emergency Department and after careful evaluation, we did not find any emergent condition requiring admission or further testing in the hospital.  Your exam/testing today is overall reassuring.  Recommend firm pressure to the nose for 20 minutes if you were to start bleeding again.  Follow-up with your regular doctor.  Please return to the Emergency Department if you experience any worsening of your condition.   Thank you for allowing [place] to be a part of your care.

## 2024-01-26 NOTE — ED Provider Notes (Signed)
[facility] Emergency Department Provider Note MRN:  [id number]  Arrival date & time: 01/26/24     Chief Complaint   Epistaxis   History of Present Illness   Eric [name] is a 89 y.o. year-old male with a history of DVT, A-fib presenting to the ED with chief complaint of epistaxis.  Epistaxis a couple days ago, and then 3 separate occasions of epistaxis today, the last 20 would not really stop and so here for evaluation.  Bleeding has since stopped.  Has held his Eliquis today.  Review of Systems  A thorough review of systems was obtained and all systems are negative except as noted in the HPI and PMH.   Patient's Health History    Past Medical History:  Diagnosis Date   Anxiety    Arthritis    Atypical chest pain    a. 05/2017 MV: no ischemia, EF 79%.   Chest pain 10/20/2017   Chronic diastolic CHF (congestive heart failure) (HCC)    a. 01/2011 Echo: EF 50-55%, gr1 DD, mild AI, nl RV fxn, mild TR/PR; b. 10/2017 Echo: EF 60-65%, mild LVH, gr2 DD.   DDD (degenerative disc disease), cervical    Depressive disorder, not elsewhere classified    Difficult intubation    Dysphagia, oral phase    Dyspnea    Edema    Gallstones    a. Symptomatic - s/p lap chole 05/2018.   History of DVT (deep vein thrombosis)    History of kidney stones    Hypertension    Hypoxemia    Impacted cerumen    Ischemic colitis (HCC)    a. 02/2018 GIB - colonoscopy w/ isch colitis. Anticoagulation resumed.   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 a. 01/2011 in setting of post-op complications including aspiration pna;  b. CHA2DS2VASc = 4--> Amio/Eliquis.   Pneumonia 03/06/2003   Spinal stenosis,  unspecified region other than cervical    Squamous cell carcinoma of skin of trunk, except scrotum    skin cancer of shoulder   Syncope    a. 10/2017-->Event monitor: RSR, rare PACs/PVCs.   Thoracic or lumbosacral neuritis or radiculitis, unspecified     Past Surgical History:  Procedure Laterality Date   CARDIOVERSION N/A 06/20/2017   Procedure: CARDIOVERSION;  Surgeon: Antonieta Iba, MD;  Location: [facility] ORS;  Service: Cardiovascular;  Laterality: N/A;   CATARACT EXTRACTION W/ INTRAOCULAR LENS  IMPLANT, BILATERAL     CERVICAL FUSION  02/10/2011   CHOLECYSTECTOMY  05/24/2018   CHOLECYSTECTOMY N/A 05/24/2018   Procedure: LAPAROSCOPIC CHOLECYSTECTOMY;  Surgeon: Abigail Miyamoto, MD;  Location: [facility] OR;  Service: General;  Laterality: N/A;   COLONOSCOPY WITH PROPOFOL N/A 02/20/2018   Procedure: COLONOSCOPY WITH PROPOFOL;  Surgeon: Midge Minium, MD;  Location: [facility] ENDOSCOPY;  Service: Endoscopy;  Laterality: N/A;   history of abd ultrasound  11/01   fatty liver   MULTIPLE TOOTH EXTRACTIONS     ORIF FIBULA FRACTURE Left 01/06/2017   Procedure: OPEN REDUCTION INTERNAL FIXATION (ORIF) FIBULA FRACTURE DISTAL FIBULA;  Surgeon: Marcene Corning, MD;  Location: [facility] OR;  Service: Orthopedics;  Laterality: Left;  Patient  states has problems if he will have a tube in throat for Genera; Anesthesia    Family History  Problem Relation Age of Onset   Stroke Father    Atrial fibrillation Brother        on coumadin   Heart disease Brother        AFib- coumadin   Stroke Paternal Grandfather    Other Mother        hemorrhage   Cancer Other        colon cancer at early age   Prostate cancer Neg Hx    Kidney cancer Neg Hx    Bladder Cancer Neg Hx     Social History   Socioeconomic History   Marital status: Married    Spouse name: Not on file   Number of children: 2   Years of education: 12   Highest education level: High school graduate  Occupational History   Occupation: Retired 2006     Employer: RETIRED    Comment: Teacher, 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   Left-handed (due to arthritis, he uses his right hand more now).   Caffeine use: 2 cups per day.   Social Drivers of Corporate investment banker Strain: Low Risk  (03/09/2018)   Overall Financial Resource Strain (CARDIA)    Difficulty of Paying Living Expenses: Not hard at all  Food Insecurity: No Food Insecurity (09/21/2023)   Hunger Vital Sign    Worried About Running Out of Food in the Last Year: Never true    Ran Out of Food in the Last Year: Never true  Transportation Needs: No Transportation Needs (09/21/2023)   PRAPARE - Administrator, Civil Service (Medical): No    Lack of Transportation (Non-Medical): No  Physical Activity: Insufficiently Active (03/09/2018)   Exercise Vital Sign    Days of Exercise per Week: 7 days    Minutes of Exercise per Session: 10 min  Stress: No Stress Concern Present (03/09/2018)   [employer] of Occupational Health - Occupational Stress Questionnaire    Feeling of Stress : Not at all  Social Connections: Moderately Integrated (03/09/2018)   Social Connection and Isolation Panel [NHANES]    Frequency of Communication with Friends and Family: More than three times a week    Frequency of Social Gatherings with Friends and Family: More than three times a week    Attends Religious Services: More than 4 times per year    Active Member of [employer] or Organizations: No    Attends Banker Meetings: Never    Marital Status: Married  Catering manager Violence: Not At Risk (09/16/2023)   Humiliation,  Afraid, Rape, and Kick questionnaire    Fear of Current or Ex-Partner: No    Emotionally Abused: No    Physically Abused: No    Sexually Abused: No     Physical Exam   Vitals:   01/25/24 2248 01/26/24 0255  BP: (!) 177/77 137/66  Pulse: 64 65  Resp: 20 20  Temp: 98.6 F (  37 C) 98.6 F (37 C)  SpO2: 92% 94%    CONSTITUTIONAL: Well-appearing, NAD NEURO/PSYCH:  Alert and oriented x 3, no focal deficits EYES:  eyes equal and reactive ENT/NECK:  no LAD, no JVD CARDIO: Regular rate, well-perfused, normal S1 and S2 PULM:  CTAB no wheezing or rhonchi GI/GU:  non-distended, non-tender MSK/SPINE:  No gross deformities, no edema SKIN:  no rash, atraumatic   *Additional and/or pertinent findings included in MDM below  Diagnostic and Interventional Summary    EKG Interpretation Date/Time:    Ventricular Rate:    PR Interval:    QRS Duration:    QT Interval:    QTC Calculation:   R Axis:      Text Interpretation:         Labs Reviewed  CBC - Abnormal; Notable for the following components:      Result Value   RBC 4.20 (*)    All other components within normal limits    No orders to display    Medications  oxymetazoline (AFRIN) 0.05 % nasal spray 1 spray (has no administration in time range)     Procedures  /  Critical Care Procedures  ED Course and Medical Decision Making  Initial Impression and Ddx Epistaxis now resolved, I see no blood in the nares, no blood in the mouth, vitals are normal.  No symptoms.  Past medical/surgical history that increases complexity of ED encounter: Anticoagulated  Interpretation of Diagnostics I personally reviewed the laboratory assessment and my interpretation is as follows: Reassuring H&H    Patient Reassessment and Ultimate Disposition/Management     Discharge.  Patient explains that this happened once before and he was advised to pause his anticoagulation for 2 or 3 days.  He wants to do the same this time, seems  reasonable.  Patient management required discussion with the following services or consulting groups:  None  Complexity of Problems Addressed Acute illness or injury that poses threat of life of bodily function  Additional Data Reviewed and Analyzed Further history obtained from: Further history from spouse/family member  Additional Factors Impacting ED Encounter Risk None  Elmer Sow. Pilar Plate, MD [facility] Emergency Medicine [facility] [email or web address]  Final Clinical Impressions(s) / ED Diagnoses     ICD-10-CM   1. Epistaxis  R04.0       ED Discharge Orders     None        Discharge Instructions Discussed with and Provided to Patient:    Discharge Instructions      You were evaluated in the Emergency Department and after careful evaluation, we did not find any emergent condition requiring admission or further testing in the hospital.  Your exam/testing today is overall reassuring.  Recommend firm pressure to the nose for 20 minutes if you were to start bleeding again.  Follow-up with your regular doctor.  Please return to the Emergency Department if you experience any worsening of your condition.   Thank you for allowing [place] to be a part of your care.      Sabas Sous, MD 01/26/24 [phone]

## 2024-02-01 NOTE — Telephone Encounter (Signed)
Patient and granddaughter advised that order has been placed. Nothing further needed.

## 2024-02-08 DIAGNOSIS — L578 Other skin changes due to chronic exposure to nonionizing radiation: Secondary | ICD-10-CM | POA: Diagnosis not present

## 2024-02-08 DIAGNOSIS — L814 Other melanin hyperpigmentation: Secondary | ICD-10-CM | POA: Diagnosis not present

## 2024-02-08 DIAGNOSIS — C4442 Squamous cell carcinoma of skin of scalp and neck: Secondary | ICD-10-CM | POA: Diagnosis not present

## 2024-02-28 DIAGNOSIS — D2262 Melanocytic nevi of left upper limb, including shoulder: Secondary | ICD-10-CM | POA: Diagnosis not present

## 2024-02-28 DIAGNOSIS — D2261 Melanocytic nevi of right upper limb, including shoulder: Secondary | ICD-10-CM | POA: Diagnosis not present

## 2024-02-28 DIAGNOSIS — Z85828 Personal history of other malignant neoplasm of skin: Secondary | ICD-10-CM | POA: Diagnosis not present

## 2024-02-28 DIAGNOSIS — L821 Other seborrheic keratosis: Secondary | ICD-10-CM | POA: Diagnosis not present

## 2024-02-28 DIAGNOSIS — Z08 Encounter for follow-up examination after completed treatment for malignant neoplasm: Secondary | ICD-10-CM | POA: Diagnosis not present

## 2024-02-28 DIAGNOSIS — D225 Melanocytic nevi of trunk: Secondary | ICD-10-CM | POA: Diagnosis not present

## 2024-02-28 DIAGNOSIS — L570 Actinic keratosis: Secondary | ICD-10-CM | POA: Diagnosis not present

## 2024-02-28 DIAGNOSIS — D485 Neoplasm of uncertain behavior of skin: Secondary | ICD-10-CM | POA: Diagnosis not present

## 2024-03-04 DIAGNOSIS — J4489 Other specified chronic obstructive pulmonary disease: Secondary | ICD-10-CM | POA: Diagnosis not present

## 2024-03-21 ENCOUNTER — Ambulatory Visit (INDEPENDENT_AMBULATORY_CARE_PROVIDER_SITE_OTHER): Payer: PPO | Admitting: Podiatry

## 2024-03-21 ENCOUNTER — Encounter: Payer: Self-pay | Admitting: Podiatry

## 2024-03-21 VITALS — Ht 66.0 in | Wt 193.0 lb

## 2024-03-21 DIAGNOSIS — B351 Tinea unguium: Secondary | ICD-10-CM

## 2024-03-21 DIAGNOSIS — M79674 Pain in right toe(s): Secondary | ICD-10-CM

## 2024-03-21 DIAGNOSIS — M79675 Pain in left toe(s): Secondary | ICD-10-CM | POA: Diagnosis not present

## 2024-03-21 DIAGNOSIS — D689 Coagulation defect, unspecified: Secondary | ICD-10-CM

## 2024-03-21 NOTE — Progress Notes (Signed)
 This patient returns to my office for at risk foot care.  This patient requires this care by a professional since this patient will be at risk due to having coagulation defect.  This patient is unable to cut nails himself since the patient cannot reach his nails.These nails are painful walking and wearing shoes.  This patient has not been seen for over 11 months. This patient presents for at risk foot care today.  General Appearance  Alert, conversant and in no acute stress.  Vascular  Dorsalis pedis and posterior tibial  pulses are  weakly palpable  bilaterally.  Capillary return is within normal limits  bilaterally. Temperature is within normal limits  bilaterally.  Neurologic  Senn-Weinstein monofilament wire test within normal limits  bilaterally. Muscle power within normal limits bilaterally.  Nails Thick disfigured discolored nails with subungual debris  from hallux to fifth toes bilaterally. No evidence of bacterial infection or drainage bilaterally.  Orthopedic  No limitations of motion  feet .  No crepitus or effusions noted.  Hammer toes  B/L.Marland Kitchen  Skin  normotropic skin with no porokeratosis noted bilaterally.  No signs of infections or ulcers noted.     Onychomycosis  Pain in right toes  Pain in left toes  Consent was obtained for treatment procedures.   Mechanical debridement of nails 1-5  bilaterally performed with a nail nipper.  Filed with dremel without incident.    Return office visit   3 months                   Told patient to return for periodic foot care and evaluation due to potential at risk complications.   Helane Gunther DPM

## 2024-03-22 ENCOUNTER — Other Ambulatory Visit: Payer: Self-pay | Admitting: Nurse Practitioner

## 2024-03-22 DIAGNOSIS — E039 Hypothyroidism, unspecified: Secondary | ICD-10-CM

## 2024-03-23 ENCOUNTER — Other Ambulatory Visit: Payer: Self-pay | Admitting: Nurse Practitioner

## 2024-03-23 DIAGNOSIS — G8929 Other chronic pain: Secondary | ICD-10-CM

## 2024-04-01 ENCOUNTER — Telehealth: Payer: Self-pay | Admitting: Pharmacist

## 2024-04-01 DIAGNOSIS — E782 Mixed hyperlipidemia: Secondary | ICD-10-CM

## 2024-04-01 NOTE — Progress Notes (Signed)
   04/01/2024  Patient ID: Eric [name], male   DOB: Jun 06, 1932, 89 y.o.   MRN: [id number]  Pharmacy Quality Measure Review  This patient is appearing on a report for being at risk of failing the adherence measure for cholesterol (statin) medications this calendar year.   Medication: Pravastatin   Last fill date: 12/28/2023 for 90 day supply  Will follow up for refills  Geronimo Krabbe, PharmD, [facility] Clinical Pharmacist [phone]

## 2024-04-03 ENCOUNTER — Other Ambulatory Visit: Payer: Self-pay | Admitting: Nurse Practitioner

## 2024-04-03 ENCOUNTER — Ambulatory Visit (INDEPENDENT_AMBULATORY_CARE_PROVIDER_SITE_OTHER): Admitting: Adult Health

## 2024-04-03 ENCOUNTER — Encounter: Payer: Self-pay | Admitting: Adult Health

## 2024-04-03 VITALS — BP 142/80 | HR 33 | Temp 97.5°F | Resp 14 | Ht 66.0 in | Wt 195.6 lb

## 2024-04-03 DIAGNOSIS — I5032 Chronic diastolic (congestive) heart failure: Secondary | ICD-10-CM | POA: Diagnosis not present

## 2024-04-03 DIAGNOSIS — J449 Chronic obstructive pulmonary disease, unspecified: Secondary | ICD-10-CM

## 2024-04-03 DIAGNOSIS — I480 Paroxysmal atrial fibrillation: Secondary | ICD-10-CM | POA: Diagnosis not present

## 2024-04-03 DIAGNOSIS — R7989 Other specified abnormal findings of blood chemistry: Secondary | ICD-10-CM

## 2024-04-03 DIAGNOSIS — L03115 Cellulitis of right lower limb: Secondary | ICD-10-CM | POA: Diagnosis not present

## 2024-04-03 DIAGNOSIS — E782 Mixed hyperlipidemia: Secondary | ICD-10-CM

## 2024-04-03 MED ORDER — POTASSIUM CHLORIDE CRYS ER 20 MEQ PO TBCR: 20.0000 meq | EXTENDED_RELEASE_TABLET | Freq: Every day | ORAL | 0 refills | Status: DC

## 2024-04-03 MED ORDER — FUROSEMIDE 40 MG PO TABS: 40.0000 mg | ORAL_TABLET | Freq: Every day | ORAL | 3 refills | Status: DC

## 2024-04-03 MED ORDER — SACCHAROMYCES BOULARDII 250 MG PO CAPS: 250.0000 mg | ORAL_CAPSULE | Freq: Two times a day (BID) | ORAL | Status: DC

## 2024-04-03 MED ORDER — DOXYCYCLINE HYCLATE 100 MG PO TABS: 100.0000 mg | ORAL_TABLET | Freq: Two times a day (BID) | ORAL | 0 refills | Status: DC

## 2024-04-03 NOTE — Progress Notes (Signed)
 [facility] clinic  Provider:  Inge Mangle DNP  Code Status:  Limited DNR  Goals of Care:     04/03/2024    3:08 PM  Advanced Directives  Does Patient Have a Medical Advance Directive? Yes  Type of Estate agent of [place];Living will  Does patient want to make changes to medical advance directive? No - Patient declined  Copy of Healthcare Power of Attorney in Chart? No - copy requested     Chief Complaint  Patient presents with   Leg Swelling    Patient complains of right leg swelling and wound. Patient states this started Thursday 03/28/2024.     Discussed the use of AI scribe software for clinical note transcription with the patient, who gave verbal consent to proceed.  HPI: Patient is a 89 y.o. male seen today for an acute visit for leg swelling. He was accompanied today by his granddaughter.  He has swelling in the right lower leg with an open wound that began approximately six days ago. The wound is characterized by serous drainage and a foul odor, forming a puddle. He notes that the wound 'stinks' and has been draining fluid since it opened. No fever is present.  He uses oxygen  at home, primarily at night, and experiences shortness of breath. His oxygen  saturation was initially 88% and improved to 94% with supplemental oxygen . He takes Eliquis  5 mg twice daily and Metoprolol  tartrate 12.5 mg twice a day for atrial fibrillation. He took Metoprolol  tartrate  this morning. Heart rate noted to be in the 30s.  He has a history of chronic heart failure and takes Lasix  40 mg daily. He did not take Lasix  today due to the anticipated need to use the bathroom frequently. His weight has been stable, around 193-195 pounds.  He takes Trelegy and albuterol  as needed for COPD.  He has not had any falls in the past year and has advanced directives in place, including a DNR preference.     Past Medical History:  Diagnosis Date   Anxiety    Arthritis     Atypical chest pain    a. 05/2017 MV: no ischemia, EF 79%.   Chest pain 10/20/2017   Chronic diastolic CHF (congestive heart failure) (HCC)    a. 01/2011 Echo: EF 50-55%, gr1 DD, mild AI, nl RV fxn, mild TR/PR; b. 10/2017 Echo: EF 60-65%, mild LVH, gr2 DD.   DDD (degenerative disc disease), cervical    Depressive disorder, not elsewhere classified    Difficult intubation    Dysphagia, oral phase    Dyspnea    Edema    Gallstones    a. Symptomatic - s/p lap chole 05/2018.   History of DVT (deep vein thrombosis)    History of kidney stones    Hypertension    Hypoxemia    Impacted cerumen    Ischemic colitis (HCC)    a. 02/2018 GIB - colonoscopy w/ isch colitis. Anticoagulation resumed.   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 a. 01/2011 in setting of post-op complications including aspiration pna;  b. CHA2DS2VASc = 4--> Amio/Eliquis .   Pneumonia 03/06/2003  Spinal stenosis, unspecified region other than cervical    Squamous cell carcinoma of skin of trunk, except scrotum    skin cancer of shoulder   Syncope    a. 10/2017-->Event monitor: RSR, rare PACs/PVCs.   Thoracic or lumbosacral neuritis or radiculitis, unspecified     Past Surgical History:  Procedure Laterality Date   CARDIOVERSION N/A 06/20/2017   Procedure: CARDIOVERSION;  Surgeon: Devorah Fonder, MD;  Location: [facility] ORS;  Service: Cardiovascular;  Laterality: N/A;   CATARACT EXTRACTION W/ INTRAOCULAR LENS  IMPLANT, BILATERAL     CERVICAL FUSION  02/10/2011   CHOLECYSTECTOMY  05/24/2018   CHOLECYSTECTOMY N/A 05/24/2018   Procedure: LAPAROSCOPIC CHOLECYSTECTOMY;  Surgeon: Oza Blumenthal, MD;  Location: [facility] OR;  Service: General;   Laterality: N/A;   COLONOSCOPY WITH PROPOFOL  N/A 02/20/2018   Procedure: COLONOSCOPY WITH PROPOFOL ;  Surgeon: Marnee Sink, MD;  Location: [facility] ENDOSCOPY;  Service: Endoscopy;  Laterality: N/A;   history of abd ultrasound  11/01   fatty liver   MULTIPLE TOOTH EXTRACTIONS     ORIF FIBULA FRACTURE Left 01/06/2017   Procedure: OPEN REDUCTION INTERNAL FIXATION (ORIF) FIBULA FRACTURE DISTAL FIBULA;  Surgeon: Dayne Even, MD;  Location: [facility] OR;  Service: Orthopedics;  Laterality: Left;  Patient states has problems if he will have a tube in throat for Genera; Anesthesia    Allergies  Allergen Reactions   Morphine And Codeine Shortness Of Breath   Percocet [Oxycodone-Acetaminophen ] Shortness Of Breath   Valium Shortness Of Breath    Outpatient Encounter Medications as of 04/03/2024  Medication Sig   albuterol  (PROVENTIL ) (2.5 MG/3ML) 0.083% nebulizer solution TAKE 3MLS(1 VIAL) VIA NEBULIZER EVERY 6 HOURS AS NEEDED   cetirizine  (ZYRTEC ) 10 MG tablet TAKE 1 TABLET(10 MG) BY MOUTH DAILY   Cholecalciferol  (VITAMIN D ) 1000 UNITS capsule Take one tablet once daily   doxycycline  (VIBRA -TABS) 100 MG tablet Take 1 tablet (100 mg total) by mouth 2 (two) times daily for 14 days.   ELIQUIS  5 MG TABS tablet TAKE 1 TABLET BY MOUTH TWICE DAILY   fluticasone  (FLONASE ) 50 MCG/ACT nasal spray Place 1 spray into both nostrils daily as needed for allergies or rhinitis.   Fluticasone -Umeclidin-Vilant (TRELEGY ELLIPTA ) 100-62.5-25 MCG/ACT AEPB Inhale 1 puff into the lungs daily.   furosemide  (LASIX ) 40 MG tablet Take 1 tablet (40 mg total) by mouth daily for 3 days. Take in the afternoon   gabapentin  (NEURONTIN ) 300 MG capsule TAKE 1 CAPSULE(300 MG) BY MOUTH TWICE DAILY   guaiFENesin -dextromethorphan  (ROBITUSSIN DM) 100-10 MG/5ML syrup Take 10 mLs by mouth every 4 (four) hours as needed for cough.   levothyroxine  (SYNTHROID ) 25 MCG tablet TAKE 1 TABLET BY MOUTH EVERY DAY BEFORE BREAKFAST AS DIRECTED   metoprolol   tartrate (LOPRESSOR ) 25 MG tablet TAKE 1/2 TABLET(12.5 MG) BY MOUTH TWICE DAILY   OXYGEN  Inhale 4 L into the lungs at bedtime.    pantoprazole  (PROTONIX ) 40 MG tablet Take 40 mg by mouth at bedtime.   potassium chloride  SA (KLOR-CON  M) 20 MEQ tablet Take 1 tablet (20 mEq total) by mouth daily for 3 days.   saccharomyces boulardii (FLORASTOR) 250 MG capsule Take 1 capsule (250 mg total) by mouth 2 (two) times daily for 17 days.   Fluticasone -Umeclidin-Vilant (TRELEGY ELLIPTA ) 100-62.5-25 MCG/ACT AEPB Inhale 1 puff into the lungs daily. (Patient not taking: Reported on 04/03/2024)   furosemide  (LASIX ) 40 MG tablet Take 1 tablet (40 mg total) by mouth daily.   predniSONE  (DELTASONE ) 10 MG tablet 4 tabs for 2  days, then 3 tabs for 2 days, 2 tabs for 2 days, then 1 tab for 2 days, then stop (Patient not taking: Reported on 04/03/2024)   [DISCONTINUED] pravastatin  (PRAVACHOL ) 40 MG tablet TAKE 1 TABLET BY MOUTH EVERY DAY (Patient not taking: Reported on 04/03/2024)   No facility-administered encounter medications on file as of 04/03/2024.    Review of Systems:  Review of Systems  Constitutional:  Negative for activity change, appetite change and fever.  HENT:  Negative for sore throat.   Eyes: Negative.   Respiratory:  Positive for shortness of breath.   Cardiovascular:  Positive for leg swelling. Negative for chest pain.  Gastrointestinal:  Negative for abdominal distention, diarrhea and vomiting.  Genitourinary:  Negative for dysuria, frequency and urgency.  Skin:  Negative for color change.  Neurological:  Negative for dizziness and headaches.  Psychiatric/Behavioral:  Negative for behavioral problems and sleep disturbance. The patient is not nervous/anxious.     Health Maintenance  Topic Date Due   Zoster Vaccines- Shingrix (1 of 2) 05/16/1951   COVID-19 Vaccine (3 - Pfizer risk series) 03/07/2020   DTaP/Tdap/Td (3 - Td or Tdap) 10/15/2023   Medicare Annual Wellness (AWV)  03/30/2024    INFLUENZA VACCINE  07/12/2024   Pneumonia Vaccine 83+ Years old  Completed   HPV VACCINES  Aged Out   Meningococcal B Vaccine  Aged Out   Colonoscopy  Discontinued    Physical Exam: Vitals:   04/03/24 1446  BP: (!) 142/80  Pulse: (!) 33  Resp: 14  Temp: (!) 97.5 F (36.4 C)  SpO2: (!) 85%  Weight: 195 lb 9.6 oz (88.7 kg)  Height: 5\' 6"  (1.676 m)   Body mass index is 31.57 kg/m. Physical Exam Constitutional:      General: He is not in acute distress.    Appearance: He is obese.  HENT:     Head: Normocephalic and atraumatic.     Mouth/Throat:     Mouth: Mucous membranes are moist.  Eyes:     Conjunctiva/sclera: Conjunctivae normal.  Cardiovascular:     Rate and Rhythm: Normal rate and regular rhythm.     Pulses: Normal pulses.     Heart sounds: Normal heart sounds.  Pulmonary:     Effort: Pulmonary effort is normal.     Breath sounds: Normal breath sounds.  Abdominal:     General: Bowel sounds are normal.     Palpations: Abdomen is soft.  Musculoskeletal:        General: No swelling. Normal range of motion.     Cervical back: Normal range of motion.     Right lower leg: Edema present.     Left lower leg: Edema present.     Comments: BLE 2+ edema, R > L  Skin:    General: Skin is warm and dry.     Comments: Right lower shin with erythema and open wound, 7 X 6 cm RLE weeping clear drainage  Neurological:     General: No focal deficit present.     Mental Status: He is alert and oriented to person, place, and time.  Psychiatric:        Mood and Affect: Mood normal.        Behavior: Behavior normal.        Thought Content: Thought content normal.        Judgment: Judgment normal.     Labs reviewed: Basic Metabolic Panel: Recent Labs    09/15/23 0626 09/16/23 0537 09/17/23 0650  09/18/23 1004 09/19/23 0927 09/20/23 0540 10/06/23 1030 12/15/23 1323  NA 137   < > 140 136 139 138 143 139  K 4.1   < > 3.9 4.0 3.9 3.8 4.2 4.1  CL 102   < > 98 95* 93* 96*  108 104  CO2 24   < > 31 28 32 32  --  21*  GLUCOSE 164*   < > 117* 156* 140* 120* 112* 79  BUN 28*   < > 33* 34* 37* 37* 28* 26*  CREATININE 1.25*   < > 1.36* 1.36* 1.25* 1.03 1.10 1.00  CALCIUM 8.5*   < > 8.5* 8.7* 8.9 8.9  --  8.8*  MG 2.0   < > 2.2 2.3  --  2.4  --   --   PHOS 2.4*  --   --   --   --   --   --   --    < > = values in this interval not displayed.   Liver Function Tests: Recent Labs    06/23/23 0952 09/14/23 1707  AST 35 40  ALT 32 40  ALKPHOS  --  47  BILITOT 1.3* 1.7*  PROT 7.1 6.9  ALBUMIN  --  3.7   No results for input(s): "LIPASE", "AMYLASE" in the last 8760 hours. No results for input(s): "AMMONIA" in the last 8760 hours. CBC: Recent Labs    09/18/23 1004 09/19/23 0927 09/20/23 0540 10/06/23 1030 01/25/24 2251  WBC 10.0 8.6 9.7  --  7.0  NEUTROABS 7.8* 5.3 7.1  --   --   HGB 14.3 15.1 15.3 13.3 13.1  HCT 44.7 47.4 47.0 39.0 42.0  MCV 98.2 96.0 96.1  --  100.0  PLT 219 222 218  --  190   Lipid Panel: No results for input(s): "CHOL", "HDL", "LDLCALC", "TRIG", "CHOLHDL", "LDLDIRECT" in the last 8760 hours. Lab Results  Component Value Date   HGBA1C 5.7 (H) 05/16/2016    Procedures since last visit: No results found.  Assessment/Plan  1. Cellulitis of right lower extremity (Primary) - Cellulitis with 7x6 cm wound, erythema, serous drainage, edema, and malodor. Antibiotics initiated. - Start antibiotics for cellulitis. - Apply non-adhesive dressing to wound. - Advise follow-up if no improvement in 2 weeks. - doxycycline  (VIBRA -TABS) 100 MG tablet; Take 1 tablet (100 mg total) by mouth 2 (two) times daily for 14 days.  Dispense: 28 tablet; Refill: 0 - saccharomyces boulardii (FLORASTOR) 250 MG capsule; Take 1 capsule (250 mg total) by mouth 2 (two) times daily for 17 days. - Basic Metabolic Panel with eGFR - Brain Natriuretic Peptide - CBC with Differential/Platelets - furosemide  (LASIX ) 40 MG tablet; Take 1 tablet (40 mg total) by  mouth daily for 3 days. Take in the afternoon  Dispense: 30 tablet; Refill: 3  2. Chronic heart failure with preserved ejection fraction (HCC) -  Chronic heart failure with lower extremity edema. Dyspnea possibly related to heart failure and COPD. - Increase Lasix  to 40 mg twice daily for 3 days. - Order blood work for renal function and heart failure markers. - Advise cardiology follow-up. - Schedule follow-up in one week to evaluate Lasix  response. - furosemide  (LASIX ) 40 MG tablet; Take 1 tablet (40 mg total) by mouth daily for 3 days. Take in the afternoon  Dispense: 30 tablet; Refill: 3 - potassium chloride  SA (KLOR-CON  M) 20 MEQ tablet; Take 1 tablet (20 mEq total) by mouth daily for 3 days.  Dispense: 3 tablet; Refill:  0  3. PAF (paroxysmal atrial fibrillation) (HCC) -  Atrial fibrillation with bradycardia, heart rate at 33 bpm. On metoprolol  and Eliquis . Heart rate monitoring advised. - Hold metoprolol  if heart rate is below 60 bpm. - Advise cardiology follow-up.  4. Chronic obstructive pulmonary disease, unspecified COPD type (HCC) -  COPD with dyspnea. Uses Trelegy Ellipta  and albuterol  PRN. Dyspnea Scheller be exacerbated by heart failure. - Continue Trelegy Ellipta , one puff daily. - Use albuterol  as needed for dyspnea.    General Health Maintenance Advised probiotics due to long-term antibiotic use to prevent yeast infections. - Recommend Florastor for 17 days.  Goals of Care Has advanced directives, does not wish resuscitation or life-sustaining measures. Values family time and quality of life.  Follow-up - Schedule follow-up in one week to assess breathing and Lasix  response. - Advise cardiology follow-up.     Labs/tests ordered:   CBC, BMP, BNP   Return in about 1 week (around 04/10/2024).  Eric Brunkhorst Medina-Vargas, NP

## 2024-04-04 DIAGNOSIS — J4489 Other specified chronic obstructive pulmonary disease: Secondary | ICD-10-CM | POA: Diagnosis not present

## 2024-04-04 LAB — CBC WITH DIFFERENTIAL/PLATELET
Absolute Lymphocytes: 1836 {cells}/uL (ref 850–3900)
Absolute Monocytes: 643 {cells}/uL (ref 200–950)
Basophils Absolute: 60 {cells}/uL (ref 0–200)
Basophils Relative: 0.9 %
Eosinophils Absolute: 121 {cells}/uL (ref 15–500)
Eosinophils Relative: 1.8 %
HCT: 40.9 % (ref 38.5–50.0)
Hemoglobin: 12.9 g/dL — ABNORMAL LOW (ref 13.2–17.1)
MCH: 29.5 pg (ref 27.0–33.0)
MCHC: 31.5 g/dL — ABNORMAL LOW (ref 32.0–36.0)
MCV: 93.4 fL (ref 80.0–100.0)
MPV: 10.8 fL (ref 7.5–12.5)
Monocytes Relative: 9.6 %
Neutro Abs: 4040 {cells}/uL (ref 1500–7800)
Neutrophils Relative %: 60.3 %
Platelets: 155 10*3/uL (ref 140–400)
RBC: 4.38 10*6/uL (ref 4.20–5.80)
RDW: 13 % (ref 11.0–15.0)
Total Lymphocyte: 27.4 %
WBC: 6.7 10*3/uL (ref 3.8–10.8)

## 2024-04-04 LAB — BASIC METABOLIC PANEL WITHOUT GFR
BUN/Creatinine Ratio: 24 (calc) — ABNORMAL HIGH (ref 6–22)
BUN: 30 mg/dL — ABNORMAL HIGH (ref 7–25)
CO2: 28 mmol/L (ref 20–32)
Calcium: 9.2 mg/dL (ref 8.6–10.3)
Chloride: 108 mmol/L (ref 98–110)
Creat: 1.25 mg/dL — ABNORMAL HIGH (ref 0.70–1.22)
Glucose, Bld: 90 mg/dL (ref 65–139)
Potassium: 4.2 mmol/L (ref 3.5–5.3)
Sodium: 143 mmol/L (ref 135–146)

## 2024-04-04 LAB — BRAIN NATRIURETIC PEPTIDE: Brain Natriuretic Peptide: 333 pg/mL — ABNORMAL HIGH (ref <100)

## 2024-04-04 NOTE — Progress Notes (Signed)
-    electrolytes normal, kidney function stable -  BNP elevated, dosage of Furosemide  increased X 3 days, pls make appointment with cardiology.  -  hgb stable, wbc not elevated -  pls check weight daily and log, bring to next appointment, 04/11/24

## 2024-04-05 ENCOUNTER — Telehealth: Payer: Self-pay | Admitting: Cardiovascular Disease

## 2024-04-05 NOTE — Telephone Encounter (Signed)
 Called patient and left message for call back.

## 2024-04-05 NOTE — Telephone Encounter (Signed)
 With permission from patient spoke with granddaughter.  Granddaughter reports that patient was seen by PCP on 04/03/24 for bilateral leg swelling. When patient was seen at PCP's office his heart rate was 33. Patient was told to hold Metoprolol  and follow up with cardiology. Patient scheduled to be seen on 04/08/24. Per granddaughter the patient's blood pressure is 140/80 and manual heart rate is 29 today. Patient has been holding Metoprolol  since seeing his PCP. Patient is asymptomatic. Discussed with Dr. Gollan. Per Dr. Gollan patient has history of PVC's and that if asymptomatic he does not need to go to the ED. Notified granddaughter of recommendations. Granddaughter verbalizes understanding. Made aware of ED precaution should any new symptoms develop or worsen.

## 2024-04-05 NOTE — Telephone Encounter (Signed)
 Patient c/o Palpitations:  STAT if patient reporting lightheadedness, shortness of breath, or chest pain  How long have you had palpitations/irregular HR/ Afib? Are you having the symptoms now? Low hr  Are you currently experiencing lightheadedness, SOB or CP? No  Do you have a history of afib (atrial fibrillation) or irregular heart rhythm? Afib  Have you checked your BP or HR? (document readings if available): 142/80 on Wednesday hr 33, seen at pcp office yesterday hr 36,50, o2 was 83 and was placed on oxygen   Are you experiencing any other symptoms? Fluid buildup, scheduled appt for Monday just in case. Granddaughter requesting cb

## 2024-04-05 NOTE — Telephone Encounter (Signed)
Eric [name] is returning call

## 2024-04-08 ENCOUNTER — Ambulatory Visit: Attending: Medical | Admitting: Medical

## 2024-04-08 ENCOUNTER — Encounter (HOSPITAL_COMMUNITY): Payer: Self-pay

## 2024-04-08 ENCOUNTER — Emergency Department (HOSPITAL_COMMUNITY)

## 2024-04-08 ENCOUNTER — Encounter: Payer: Self-pay | Admitting: Medical

## 2024-04-08 ENCOUNTER — Other Ambulatory Visit: Payer: Self-pay

## 2024-04-08 ENCOUNTER — Inpatient Hospital Stay (HOSPITAL_COMMUNITY)
Admission: EM | Admit: 2024-04-08 | Discharge: 2024-04-12 | DRG: 291 | Disposition: A | Discharge status: Home Health | Attending: Internal Medicine | Admitting: Internal Medicine

## 2024-04-08 VITALS — BP 132/57 | HR 52 | Ht 66.0 in | Wt 191.8 lb

## 2024-04-08 DIAGNOSIS — N1831 Chronic kidney disease, stage 3a: Secondary | ICD-10-CM | POA: Diagnosis not present

## 2024-04-08 DIAGNOSIS — R079 Chest pain, unspecified: Secondary | ICD-10-CM

## 2024-04-08 DIAGNOSIS — I130 Hypertensive heart and chronic kidney disease with heart failure and stage 1 through stage 4 chronic kidney disease, or unspecified chronic kidney disease: Secondary | ICD-10-CM | POA: Diagnosis not present

## 2024-04-08 DIAGNOSIS — J441 Chronic obstructive pulmonary disease with (acute) exacerbation: Secondary | ICD-10-CM | POA: Diagnosis present

## 2024-04-08 DIAGNOSIS — Z7951 Long term (current) use of inhaled steroids: Secondary | ICD-10-CM

## 2024-04-08 DIAGNOSIS — N1832 Chronic kidney disease, stage 3b: Secondary | ICD-10-CM | POA: Diagnosis not present

## 2024-04-08 DIAGNOSIS — K219 Gastro-esophageal reflux disease without esophagitis: Secondary | ICD-10-CM

## 2024-04-08 DIAGNOSIS — Z86718 Personal history of other venous thrombosis and embolism: Secondary | ICD-10-CM

## 2024-04-08 DIAGNOSIS — F419 Anxiety disorder, unspecified: Secondary | ICD-10-CM | POA: Diagnosis present

## 2024-04-08 DIAGNOSIS — G629 Polyneuropathy, unspecified: Secondary | ICD-10-CM | POA: Diagnosis present

## 2024-04-08 DIAGNOSIS — R600 Localized edema: Secondary | ICD-10-CM

## 2024-04-08 DIAGNOSIS — I4892 Unspecified atrial flutter: Secondary | ICD-10-CM | POA: Diagnosis present

## 2024-04-08 DIAGNOSIS — I509 Heart failure, unspecified: Secondary | ICD-10-CM | POA: Diagnosis not present

## 2024-04-08 DIAGNOSIS — Z8249 Family history of ischemic heart disease and other diseases of the circulatory system: Secondary | ICD-10-CM

## 2024-04-08 DIAGNOSIS — E782 Mixed hyperlipidemia: Secondary | ICD-10-CM | POA: Diagnosis not present

## 2024-04-08 DIAGNOSIS — I517 Cardiomegaly: Secondary | ICD-10-CM | POA: Diagnosis not present

## 2024-04-08 DIAGNOSIS — R0602 Shortness of breath: Secondary | ICD-10-CM

## 2024-04-08 DIAGNOSIS — I110 Hypertensive heart disease with heart failure: Secondary | ICD-10-CM | POA: Diagnosis not present

## 2024-04-08 DIAGNOSIS — R0902 Hypoxemia: Secondary | ICD-10-CM | POA: Diagnosis not present

## 2024-04-08 DIAGNOSIS — E039 Hypothyroidism, unspecified: Secondary | ICD-10-CM

## 2024-04-08 DIAGNOSIS — Z823 Family history of stroke: Secondary | ICD-10-CM

## 2024-04-08 DIAGNOSIS — Z79890 Hormone replacement therapy (postmenopausal): Secondary | ICD-10-CM

## 2024-04-08 DIAGNOSIS — I4821 Permanent atrial fibrillation: Secondary | ICD-10-CM | POA: Diagnosis present

## 2024-04-08 DIAGNOSIS — Z79899 Other long term (current) drug therapy: Secondary | ICD-10-CM

## 2024-04-08 DIAGNOSIS — Z87891 Personal history of nicotine dependence: Secondary | ICD-10-CM

## 2024-04-08 DIAGNOSIS — J439 Emphysema, unspecified: Secondary | ICD-10-CM | POA: Diagnosis present

## 2024-04-08 DIAGNOSIS — E440 Moderate protein-calorie malnutrition: Secondary | ICD-10-CM | POA: Diagnosis not present

## 2024-04-08 DIAGNOSIS — J449 Chronic obstructive pulmonary disease, unspecified: Secondary | ICD-10-CM | POA: Diagnosis present

## 2024-04-08 DIAGNOSIS — J9621 Acute and chronic respiratory failure with hypoxia: Secondary | ICD-10-CM | POA: Diagnosis not present

## 2024-04-08 DIAGNOSIS — I480 Paroxysmal atrial fibrillation: Secondary | ICD-10-CM | POA: Diagnosis not present

## 2024-04-08 DIAGNOSIS — R001 Bradycardia, unspecified: Secondary | ICD-10-CM | POA: Diagnosis not present

## 2024-04-08 DIAGNOSIS — I878 Other specified disorders of veins: Secondary | ICD-10-CM | POA: Diagnosis present

## 2024-04-08 DIAGNOSIS — I5031 Acute diastolic (congestive) heart failure: Secondary | ICD-10-CM | POA: Diagnosis not present

## 2024-04-08 DIAGNOSIS — N179 Acute kidney failure, unspecified: Secondary | ICD-10-CM | POA: Diagnosis present

## 2024-04-08 DIAGNOSIS — Z885 Allergy status to narcotic agent status: Secondary | ICD-10-CM

## 2024-04-08 DIAGNOSIS — I5033 Acute on chronic diastolic (congestive) heart failure: Secondary | ICD-10-CM

## 2024-04-08 DIAGNOSIS — I493 Ventricular premature depolarization: Secondary | ICD-10-CM | POA: Diagnosis present

## 2024-04-08 DIAGNOSIS — J9611 Chronic respiratory failure with hypoxia: Secondary | ICD-10-CM

## 2024-04-08 DIAGNOSIS — I10 Essential (primary) hypertension: Secondary | ICD-10-CM | POA: Diagnosis present

## 2024-04-08 DIAGNOSIS — Z7901 Long term (current) use of anticoagulants: Secondary | ICD-10-CM

## 2024-04-08 DIAGNOSIS — Z9981 Dependence on supplemental oxygen: Secondary | ICD-10-CM

## 2024-04-08 DIAGNOSIS — F32A Depression, unspecified: Secondary | ICD-10-CM | POA: Diagnosis present

## 2024-04-08 DIAGNOSIS — Z85828 Personal history of other malignant neoplasm of skin: Secondary | ICD-10-CM

## 2024-04-08 DIAGNOSIS — J9811 Atelectasis: Secondary | ICD-10-CM | POA: Diagnosis not present

## 2024-04-08 DIAGNOSIS — Z961 Presence of intraocular lens: Secondary | ICD-10-CM | POA: Diagnosis present

## 2024-04-08 DIAGNOSIS — D6869 Other thrombophilia: Secondary | ICD-10-CM | POA: Diagnosis present

## 2024-04-08 DIAGNOSIS — I5032 Chronic diastolic (congestive) heart failure: Secondary | ICD-10-CM | POA: Diagnosis present

## 2024-04-08 DIAGNOSIS — I482 Chronic atrial fibrillation, unspecified: Secondary | ICD-10-CM | POA: Diagnosis not present

## 2024-04-08 DIAGNOSIS — N183 Chronic kidney disease, stage 3 unspecified: Secondary | ICD-10-CM | POA: Diagnosis present

## 2024-04-08 DIAGNOSIS — Z888 Allergy status to other drugs, medicaments and biological substances status: Secondary | ICD-10-CM

## 2024-04-08 DIAGNOSIS — I872 Venous insufficiency (chronic) (peripheral): Secondary | ICD-10-CM | POA: Diagnosis present

## 2024-04-08 DIAGNOSIS — Z800 Family history of malignant neoplasm of digestive organs: Secondary | ICD-10-CM

## 2024-04-08 DIAGNOSIS — J90 Pleural effusion, not elsewhere classified: Secondary | ICD-10-CM | POA: Diagnosis not present

## 2024-04-08 DIAGNOSIS — Z66 Do not resuscitate: Secondary | ICD-10-CM | POA: Diagnosis present

## 2024-04-08 LAB — CBC WITH DIFFERENTIAL/PLATELET
Abs Immature Granulocytes: 0.01 10*3/uL (ref 0.00–0.07)
Basophils Absolute: 0.1 10*3/uL (ref 0.0–0.1)
Basophils Relative: 1 %
Eosinophils Absolute: 0.2 10*3/uL (ref 0.0–0.5)
Eosinophils Relative: 3 %
HCT: 46.4 % (ref 39.0–52.0)
Hemoglobin: 14.4 g/dL (ref 13.0–17.0)
Immature Granulocytes: 0 %
Lymphocytes Relative: 29 %
Lymphs Abs: 1.9 10*3/uL (ref 0.7–4.0)
MCH: 30 pg (ref 26.0–34.0)
MCHC: 31 g/dL (ref 30.0–36.0)
MCV: 96.7 fL (ref 80.0–100.0)
Monocytes Absolute: 0.7 10*3/uL (ref 0.1–1.0)
Monocytes Relative: 10 %
Neutro Abs: 3.7 10*3/uL (ref 1.7–7.7)
Neutrophils Relative %: 57 %
Platelets: 209 10*3/uL (ref 150–400)
RBC: 4.8 MIL/uL (ref 4.22–5.81)
RDW: 14.7 % (ref 11.5–15.5)
WBC: 6.5 10*3/uL (ref 4.0–10.5)
nRBC: 0 % (ref 0.0–0.2)

## 2024-04-08 LAB — BASIC METABOLIC PANEL WITH GFR
Anion gap: 7 (ref 5–15)
BUN: 26 mg/dL — ABNORMAL HIGH (ref 8–23)
CO2: 28 mmol/L (ref 22–32)
Calcium: 8.7 mg/dL — ABNORMAL LOW (ref 8.9–10.3)
Chloride: 106 mmol/L (ref 98–111)
Creatinine, Ser: 1.12 mg/dL (ref 0.61–1.24)
GFR, Estimated: >60 mL/min (ref >60)
Glucose, Bld: 88 mg/dL (ref 70–99)
Potassium: 4.1 mmol/L (ref 3.5–5.1)
Sodium: 141 mmol/L (ref 135–145)

## 2024-04-08 LAB — TROPONIN I (HIGH SENSITIVITY)
Troponin I (High Sensitivity): 14 ng/L (ref <18)
Troponin I (High Sensitivity): 18 ng/L — ABNORMAL HIGH (ref <18)

## 2024-04-08 LAB — BRAIN NATRIURETIC PEPTIDE: B Natriuretic Peptide: 255.7 pg/mL — ABNORMAL HIGH (ref 0.0–100.0)

## 2024-04-08 MED ORDER — IOHEXOL 350 MG/ML SOLN
75.0000 mL | Freq: Once | INTRAVENOUS | Status: AC | PRN
Start: 2024-04-08 — End: 2024-04-08
  Administered 2024-04-08: 75 mL via INTRAVENOUS

## 2024-04-08 MED ORDER — FUROSEMIDE 10 MG/ML IJ SOLN
80.0000 mg | Freq: Once | INTRAMUSCULAR | Status: AC
  Administered 2024-04-09: 80 mg via INTRAVENOUS
  Filled 2024-04-08: qty 8

## 2024-04-08 NOTE — ED Provider Notes (Addendum)
 [place] [facility]ERGENC[facility] DEPARTMENT AT [facility] Provider Note   CSN: [id number] Arrival date & time: 04/08/24  1723     History  Chief Complaint  Eric [name] presents with   Shortness of Breath   Bradycardia    Eric [name] is a 89 y.o. male.  Eric [name] is a 89 year old male who presents with shortness of breath.  Eric [name] has a history of atrial fibrillation on Eliquis , CHF, COPD, hypertension, hyperlipidemia.  Eric [name] has been having some increased shortness of breath over the last couple weeks which has been worse over the last few days.  Eric [name] is also had some increase in leg swelling.  Eric [name] has developed an ulcer to Eric [name] right lower leg.  Eric [name] saw Eric [name] PCP last week.  Eric [name] was noted to have a low heart rate in the 30s and Eric [name] beta-blocker was stopped.  Eric [name] also was told to take Eric [name] Lasix  double the dose for 3 days and then go back to Eric [name] normal dose.  Eric [name] was also started on doxycycline  for Eric [name] leg wound.  Eric [name] says the symptoms have been improved.  Eric [name] still has some worsening shortness of breath.  Eric [name] went to Eric [name] cardiologist today and was visibly short of breath with edema.  Eric [name] was felt to likely be fluid overloaded and sent here for further evaluation.  Eric [name] denies any known fevers.  No productive cough.  No pleuritic chest pain.  No exertional chest pain.  Eric [name] has had some intermittent pain to the center of Eric [name] chest.  Eric [name] says it happens both at rest and with exertion.  Is not really worse with exertion.  Eric [name] denies any chest pain currently.  Eric [name] does use home oxygen  4 L/min at night.  At home Eric [name] just uses it when Eric [name] needs it but typically does not use it in the daytime.       Home Medications Prior to Admission medications   Medication Sig Start Date End Date Taking? Authorizing Provider  albuterol  (PROVENTIL ) (2.5 MG/3ML) 0.083% nebulizer solution TAKE 3MLS(1 VIAL) VIA NEBULIZER EVERY 6 HOURS AS NEEDED Eric [name] taking differently: Take 2.5 mg by nebulization every 6 (six) hours as needed for wheezing or  shortness of breath. 10/16/23  Yes Verma Gobble, NP  cetirizine  (ZYRTEC ) 10 MG tablet TAKE 1 TABLET(10 MG) BY MOUTH DAILY 03/22/24  Yes Verma Gobble, NP  Cholecalciferol  (VITAMIN D3) 1000 units CAPS Take 1,000 Units by mouth daily.   Yes [provider]  doxycycline  (VIBRA -TABS) 100 MG tablet Take 1 tablet (100 mg total) by mouth 2 (two) times daily for 14 days. 04/03/24 04/17/24 Yes Medina-Vargas, Monina C, NP  ELIQUIS  5 MG TABS tablet TAKE 1 TABLET BY MOUTH TWICE DAILY 04/24/23  Yes Eubanks, Jessica K, NP  Fluticasone -Umeclidin-Vilant (TRELEGY ELLIPTA ) 100-62.5-25 MCG/ACT AEPB Inhale 1 puff into the lungs daily. 01/10/24  Yes Assaker, Marianne Shirts, MD  furosemide  (LASIX ) 40 MG tablet Take 1 tablet (40 mg total) by mouth daily. 09/20/23 04/08/24 Yes Aisha Hove, MD  gabapentin  (NEURONTIN ) 300 MG capsule TAKE 1 CAPSULE(300 MG) BY MOUTH TWICE DAILY 03/25/24  Yes Eubanks, Jessica K, NP  guaiFENesin -dextromethorphan  (ROBITUSSIN DM) 100-10 MG/5ML syrup Take 10 mLs by mouth every 4 (four) hours as needed for cough. 09/20/23  Yes Aisha Hove, MD  levothyroxine  (SYNTHROID ) 25 MCG tablet TAKE 1 TABLET BY MOUTH EVERY DAY BEFORE BREAKFAST AS DIRECTED 03/22/24  Yes Verma Gobble, NP  OXYGEN  Inhale 4 L/min into the lungs at bedtime.   Yes [provider]  pravastatin  (PRAVACHOL ) 40 MG tablet TAKE 1 TABLET BY MOUTH EVERY DAY Eric [name] taking differently: Take 40 mg by mouth at bedtime. 04/03/24  Yes Verma Gobble, NP  Cholecalciferol  (VITAMIN D ) 1000 UNITS capsule Take one tablet once daily Eric [name] not taking: Reported on 04/08/2024 08/09/13   Anthoney Kipper L, DO  fluticasone  (FLONASE ) 50 MCG/ACT nasal spray Place 1 spray into both nostrils daily as needed for allergies or rhinitis. Eric [name] not taking: Reported on 04/08/2024 10/17/22   Sood, Vineet, MD  Fluticasone -Umeclidin-Vilant (TRELEGY ELLIPTA ) 100-62.5-25 MCG/ACT AEPB Inhale 1 puff into the lungs daily. Eric [name] not  taking: Reported on 04/08/2024 01/10/24   Annitta Kindler, MD  furosemide  (LASIX ) 40 MG tablet Take 1 tablet (40 mg total) by mouth daily for 3 days. Take in the afternoon Eric [name] not taking: Reported on 04/08/2024 04/03/24 04/08/24  Medina-Vargas, Monina C, NP  metoprolol  tartrate (LOPRESSOR ) 25 MG tablet TAKE 1/2 TABLET(12.5 MG) BY MOUTH TWICE DAILY Eric [name] not taking: Reported on 04/08/2024 12/01/23   Eubanks, Jessica K, NP  potassium chloride  SA (KLOR-CON  M) 20 MEQ tablet Take 1 tablet (20 mEq total) by mouth daily for 3 days. Eric [name] not taking: Reported on 04/08/2024 04/03/24 04/06/24  Medina-Vargas, Monina C, NP  saccharomyces boulardii (FLORASTOR) 250 MG capsule Take 1 capsule (250 mg total) by mouth 2 (two) times daily for 17 days. Eric [name] not taking: Reported on 04/08/2024 04/03/24 04/20/24  Medina-Vargas, Monina C, NP      Allergies    Morphine and codeine, Percocet [oxycodone-acetaminophen ], and Valium    Review of Systems   Review of Systems  Constitutional:  Negative for chills, diaphoresis, fatigue and fever.  HENT:  Negative for congestion, rhinorrhea and sneezing.   Eyes: Negative.   Respiratory:  Positive for shortness of breath. Negative for cough and chest tightness.   Cardiovascular:  Positive for chest pain and leg swelling.  Gastrointestinal:  Negative for abdominal pain, blood in stool, diarrhea, nausea and vomiting.  Genitourinary:  Negative for difficulty urinating, flank pain, frequency and hematuria.  Musculoskeletal:  Negative for arthralgias and back pain.  Skin:  Positive for wound. Negative for rash.  Neurological:  Negative for dizziness, speech difficulty, weakness, numbness and headaches.    Physical Exam Updated Vital Signs BP (!) 156/68 (BP Location: Right Arm)   Pulse 63   Temp 97.7 F (36.5 C) (Oral)   Resp 17   Ht 5\' 6"  (1.676 m)   Wt 86.6 kg   SpO2 96%   BMI 30.83 kg/m  Physical Exam Constitutional:      Appearance: Eric [name] is well-developed.   HENT:     Head: Normocephalic and atraumatic.  Eyes:     Pupils: Pupils are equal, round, and reactive to light.  Cardiovascular:     Rate and Rhythm: Normal rate and regular rhythm.     Heart sounds: Normal heart sounds.  Pulmonary:     Effort: Pulmonary effort is normal. No respiratory distress.     Breath sounds: Rales (Few crackles in the bases) present. No wheezing.  Chest:     Chest wall: No tenderness.  Abdominal:     General: Bowel sounds are normal.     Palpations: Abdomen is soft.     Tenderness: There is no abdominal tenderness. There is no guarding or rebound.  Musculoskeletal:        General: Normal range of motion.     Cervical back: Normal range of motion and neck supple.     Comments: Edema  of the lower extremities bilaterally, the right leg is more swollen than the left.  There is an ulcerated wound in the anterior lower leg about 4 cm in diameter.  There are some mild erythema around it.  No purulent drainage.  Pedal pulses are intact.  Lymphadenopathy:     Cervical: No cervical adenopathy.  Skin:    General: Skin is warm and dry.     Findings: No rash.  Neurological:     Mental Status: Eric [name] is alert and oriented to person, place, and time.     ED Results / Procedures / Treatments   Labs (all labs ordered are listed, but only abnormal results are displayed) Labs Reviewed  BRAIN NATRIURETIC PEPTIDE - Abnormal; Notable for the following components:      Result Value   B Natriuretic Peptide 255.7 (*)    All other components within normal limits  BASIC METABOLIC PANEL WITH GFR - Abnormal; Notable for the following components:   BUN 26 (*)    Calcium 8.7 (*)    All other components within normal limits  TROPONIN I (HIGH SENSITIVITY) - Abnormal; Notable for the following components:   Troponin I (High Sensitivity) 18 (*)    All other components within normal limits  CBC WITH DIFFERENTIAL/PLATELET  TROPONIN I (HIGH SENSITIVITY)    EKG EKG  Interpretation Date/Time:  Monday April 08 2024 17:31:12 EDT Ventricular Rate:  51 PR Interval:    QRS Duration:  88 QT Interval:  474 QTC Calculation: 436 R Axis:   -46  Text Interpretation: Atrial fibrillation with slow ventricular response with premature ventricular or aberrantly conducted complexes Left axis deviation Nonspecific ST abnormality Abnormal ECG When compared with ECG of 08-Apr-2024 15:54, PREVIOUS ECG IS PRESENT Confirmed by Hershel Los [phone]) on 04/08/2024 5:49:06 PM  Radiology CT Angio Chest PE W and/or Wo Contrast Result Date: 04/08/2024 EXAM: CTA of the Chest without and with IV contrast for PE 04/08/2024 11:21:27 PM TECHNIQUE: CTA of the chest was performed after the administration of intravenous contrast. Multiplanar reformatted images are provided for review. MIP images are provided for review. Automated exposure control, iterative reconstruction, and/or weight based adjustment of the mA/kV was utilized to reduce the radiation dose to as low as reasonably achievable. COMPARISON: Chest radiograph earlier today and CT chest dated 09/15/2023. CLINICAL HISTORY: Pulmonary embolism (PE) suspected, high probability. Eric [name] went to heart MD and was noted to be hypoxic and bradycardic. Eric [name] reports increased shortness of breath and bradycardia with PVC. Eric [name] has bilateral leg swelling that is weeping. Contrast administered: 75mL (iohexol  (OMNIPAQUE ) 350 MG/ML injection 75 mL IOHEXOL  350 MG/ML SOLN). FINDINGS: PULMONARY ARTERIES: Pulmonary arteries are adequately opacified for evaluation. No evidence of pulmonary embolism. Main pulmonary artery is normal in caliber. MEDIASTINUM: No evidence of mediastinal lymphadenopathy. There is no acute abnormality of the thoracic aorta. Cardiomegaly. Atherosclerotic calcification of the aortic arch. Moderate coronary arthrosis of the LAD. LYMPH NODES: No evidence of mediastinal, hilar or axillary lymphadenopathy. LUNGS AND PLEURA: Evaluation  of the lung parenchyma is constrained by respiratory motion. Scattered dependent patchy opacities, favoring atelectasis. Small right and trace left pleural effusions. UPPER ABDOMEN: Calcified splenic granulomata. Status post cholecystectomy. SOFT TISSUES AND BONES: No acute bone or soft tissue abnormality. IMPRESSION: 1. No evidence of pulmonary embolism. 2. Cardiomegaly. Small right and trace left pleural effusions. 3. Scattered dependent patchy opacities, favoring atelectasis. Electronically signed by: Zadie Herter MD 04/08/2024 11:25 PM EDT RP Workstation: [id number]   DG Chest Port 1 View Result  Date: 04/08/2024 CLINICAL DATA:  Shortness of breath. EXAM: PORTABLE CHEST 1 VIEW COMPARISON:  Chest radiograph dated 12/15/2023. FINDINGS: No focal consolidation, pleural effusion, pneumothorax. Mild cardiomegaly. Degenerative changes of the spine. No acute osseous pathology. IMPRESSION: 1. No active disease. 2. Mild cardiomegaly. Electronically Signed   By: Angus Bark M.D.   On: 04/08/2024 18:55    Procedures Procedures    Medications Ordered in ED Medications  furosemide  (LASIX ) injection 80 mg (has no administration in time range)  iohexol  (OMNIPAQUE ) 350 MG/ML injection 75 mL (75 mLs Intravenous Contrast Given 04/08/24 2321)    ED Course/ Medical Decision Making/ A&P                                 Medical Decision Making Amount and/or Complexity of Data Reviewed Labs: ordered. Radiology: ordered.  Risk Prescription drug management.   Eric [name] is a 89 year old male who presents with shortness of breath and increased leg swelling.  Clinically Eric [name] has suggestions of fluid overload.  Chest x-ray was performed.  This was interpreted by me and confirmed by the radiologist to show no significant pulmonary edema.  No evidence of pneumonia.  Eric [name] BNP is mildly elevated.  Second troponin is mildly elevated.  CTA of the chest does not show any evidence of PE.  I did order a vascular  ultrasound of the right leg given that it had increased swelling as compared to the left.  However this did not get done before the vascular techs left for the day.  Eric [name] was given some IV Lasix .  Will need to be admitted per cardiology notes for diuresis and probable repeat echo.  Discussed with Dr. Nguyen with cardiology who will follow along with the Eric [name].  Recommends hospitalist admission.  Will consult with the hospitalist..  Discussed with Dr. [facility] Lavender.  Final Clinical Impression(s) / ED Diagnoses Final diagnoses:  Acute on chronic congestive heart failure, unspecified heart failure type [facility])    Rx / DC Orders ED Discharge Orders     None         Hershel Los, MD 04/08/24 2349    Hershel Los, MD 04/09/24 0003

## 2024-04-08 NOTE — ED Triage Notes (Signed)
 Patient went to heart MD and was noted to be hypoxic and brady.  Patient reports increased sob and brady with PVC.  Patient has bilateral leg swelling that are weeping.

## 2024-04-08 NOTE — Patient Instructions (Signed)
 Medication Instructions:  Your Physician recommend you continue on your current medication as directed.       *If you need a refill on your cardiac medications before your next appointment, please call your pharmacy*  Lab Work: No labs ordered today  If you have labs (blood work) drawn today and your tests are completely normal, you will receive your results only by: MyChart Message (if you have MyChart) OR A paper copy in the mail If you have any lab test that is abnormal or we need to change your treatment, we will call you to review the results.  Testing/Procedures: No test ordered today   Follow-Up: At [facility] ([place]), you and your health needs are our priority.  As part of our continuing mission to provide you with exceptional heart care, our providers are all part of one team.  This team includes your primary Cardiologist (physician) and Advanced Practice Providers or APPs (Physician Assistants and Nurse Practitioners) who all work together to provide you with the care you need, when you need it.  Your next appointment:   6 - 8 week(s) hospital follow up  Provider:   Timothy Gollan, MD or Cadence Gennaro Khat, PA-C

## 2024-04-08 NOTE — ED Notes (Signed)
Patient transported back to room from CT 

## 2024-04-08 NOTE — ED Notes (Signed)
 Patient transported to CT

## 2024-04-08 NOTE — ED Notes (Addendum)
 Patient currently in CT

## 2024-04-08 NOTE — Progress Notes (Signed)
 Cardiology Office Note:  .   Date:  04/08/2024  ID:  Eric [name], DOB 21-Jan-1932, MRN [id number] PCP: Verma Gobble, NP  [facility] Providers Cardiologist:  Belva Boyden, MD {  History of Present Illness: .   Eric [name] is a 89 y.o. male with a h/o Afib, PVCs, remote smoking, dilated ascending aorta, chornic HFpEF, chronic respiratory distress/COPD, HLD, HTN, hypothyroidism who presents for follow-up.   The patient has a h/o Afib with cardioversion 06/2017. He had recurrent Afib 08/2021 off amiodarone  due to thyroid  disease. Medical management was recommended. He is on Eliquis .   Echo in 2018 showed EF of 55 to 60%.  Myoview  Lexiscan  showed no significant ischemia, overall low risk.  Echo in November 2018 showed EF of 60 to 65%, grade 2 diastolic dysfunction, normal wall motion.  Heart monitor for syncope in 12/2017 showed normal sinus rhythm, no significant arrhythmia, rare PACs and PVCs.  Heart monitor in October 2019 showed normal sinus rhythm, second-degree AV block-Mobitz type I, rare ectopy.  Patient triggered events not associated with arrhythmia.  Echo in 2024 showed normal LVEF, severely dilated left atrium, severely dilated right atrium, mild MR.  Patient was last seen in June 2024 and was off amiodarone .  Patient was in rate controlled A-fib.  Today, said he went to the doctor last week for lower leg edema. HR was in the 30s and BB was stopped. Lower leg edema started 2-3 weeks ago. Reports chest pain with walking and with breathing. He has a long history of SOB. He feels breathing has not gotten worse, but granddaughter feels it is worse. He uses 4L O2 at night. He has been taking lasix  40mg  daily. PCP increased it for a few days last week without improvement.   Studies Reviewed: Aaron Aas   EKG Interpretation Date/Time:  Monday April 08 2024 15:54:14 EDT Ventricular Rate:  52 PR Interval:    QRS Duration:  82 QT Interval:  468 QTC Calculation: 435 R Axis:   -37  Text  Interpretation: Atrial fibrillation with slow ventricular response with premature ventricular or aberrantly conducted complexes Left axis deviation Nonspecific ST abnormality When compared with ECG of 06-Oct-2023 09:32, PREVIOUS ECG IS PRESENT Confirmed by Gennaro Khat, Masaichi Kracht ([id number]) on 04/08/2024 4:01:35 PM    Echo 09/2023  1. Left ventricular ejection fraction, by estimation, is 60 to 65%. The  left ventricle has normal function. The left ventricle has no regional  wall motion abnormalities. Left ventricular diastolic parameters are  indeterminate.   2. Right ventricular systolic function is moderately reduced. The right  ventricular size is moderately enlarged. There is mildly elevated  pulmonary artery systolic pressure.   3. Left atrial size was severely dilated.   4. Right atrial size was severely dilated.   5. The mitral valve is normal in structure. Mild mitral valve  regurgitation. No evidence of mitral stenosis.   6. The aortic valve is normal in structure. Aortic valve regurgitation is  trivial. No aortic stenosis is present.   7. The inferior vena cava is dilated in size with <50% respiratory  variability, suggesting right atrial pressure of 15 mmHg.   Echo 2018  1. Left ventricular ejection fraction, by estimation, is 60 to 65%. The  left ventricle has normal function. The left ventricle has no regional  wall motion abnormalities. Left ventricular diastolic parameters are  indeterminate.   2. Right ventricular systolic function is moderately reduced. The right  ventricular size is moderately enlarged. There is mildly  elevated  pulmonary artery systolic pressure.   3. Left atrial size was severely dilated.   4. Right atrial size was severely dilated.   5. The mitral valve is normal in structure. Mild mitral valve  regurgitation. No evidence of mitral stenosis.   6. The aortic valve is normal in structure. Aortic valve regurgitation is  trivial. No aortic stenosis is present.    7. The inferior vena cava is dilated in size with <50% respiratory  variability, suggesting right atrial pressure of 15 mmHg.   Myoview  lexiscan  2018 Narrative & Impression  Pharmacological myocardial perfusion imaging study with no significant  ischemia Significant GI uptake artifact  Normal wall motion, EF estimated at 79% No EKG changes concerning for ischemia at peak stress or in recovery. Rhythm is atrial fibrillation, rare PVCs Low risk scan          Physical Exam:   VS:  BP (!) 132/57 (BP Location: Left Arm, Patient Position: Sitting, Cuff Size: Normal)   Pulse (!) 52   Ht 5\' 6"  (1.676 m)   Wt 191 lb 12.8 oz (87 kg)   SpO2 96%   BMI 30.96 kg/m    Wt Readings from Last 3 Encounters:  04/08/24 191 lb 12.8 oz (87 kg)  04/03/24 195 lb 9.6 oz (88.7 kg)  03/21/24 193 lb (87.5 kg)    GEN: Well nourished, well developed in no acute distress NECK: + JVD; No carotid bruits CARDIAC: Irreg Irreg, bradycardic, no murmurs, rubs, gallops RESPIRATORY:  Clear to auscultation without rales, wheezing or rhonchi  ABDOMEN: Soft, non-tender, non-distended EXTREMITIES:  2-3+ lower leg edema; No deformity   ASSESSMENT AND PLAN: .    LLE/SOB/chest pain The patient presents with 2-3 weeks of lower leg edema, shortness of breath, and intermittent chest pain. He saw PCP last week who increased lasix  for 3 days. It was noted HR was in the 30s and metoprolol  was stopped. Today, he is still volume up on exam. He is visibly SOB. O2 is normal. EKG shows Afib with HR of 52bpm. Low suspicion for PE given Eliquis . Recent Hgb 12.9. Peregoy be acute heart failure in the setting of ?bradycardia. Long discussion regarding options, I recommended ER evaluation, however he is wanting to go to [facility]. He is accompanied by his granddaughter who will take him straight there.Patient agrees to ER evaluation. He M[place]ese need ischemic eval pending ER work-up.  Permanent Afib Bradycardia Previously on amiodarone , stopped  in 2022, has been permanent Afib since then. Prior Afib HR in the 60s. Heart rate last week at PCP office recorded in the 30s on pulse ox, which Steeber be falsely low due to PVCs. PCP stopped BB at that time. EKG today shows slow Afib with HR 52bpm. He reports compliance with Eliquis . Will need continual monitoring of heart rate and updated labs.  Acute on chronic HFpEF Echo 09/2023 showed LVEF 60-65%, no WMA, dilated bi- atria, mild MR. BNP 333 last week and lasix  was increased without improvement. Has failed outpatient diuresis. He is still significantly volume up on exam. He will likely need IV diuresis and repeat echo.   COPD Chronic respiratory distress He uses 4L O2 at night. Unsure how much this is contributing to SOB.     Dispo: Follow-up after ER/hospital   Signed, Elija Mccamish Rebekah Canada, PA-C

## 2024-04-09 ENCOUNTER — Encounter (HOSPITAL_COMMUNITY): Payer: Self-pay | Admitting: Internal Medicine

## 2024-04-09 ENCOUNTER — Inpatient Hospital Stay (HOSPITAL_COMMUNITY)

## 2024-04-09 DIAGNOSIS — Z9981 Dependence on supplemental oxygen: Secondary | ICD-10-CM | POA: Diagnosis not present

## 2024-04-09 DIAGNOSIS — Z87891 Personal history of nicotine dependence: Secondary | ICD-10-CM | POA: Diagnosis not present

## 2024-04-09 DIAGNOSIS — E440 Moderate protein-calorie malnutrition: Secondary | ICD-10-CM | POA: Diagnosis not present

## 2024-04-09 DIAGNOSIS — J9621 Acute and chronic respiratory failure with hypoxia: Secondary | ICD-10-CM | POA: Diagnosis not present

## 2024-04-09 DIAGNOSIS — I480 Paroxysmal atrial fibrillation: Secondary | ICD-10-CM | POA: Diagnosis not present

## 2024-04-09 DIAGNOSIS — Z79890 Hormone replacement therapy (postmenopausal): Secondary | ICD-10-CM | POA: Diagnosis not present

## 2024-04-09 DIAGNOSIS — M7989 Other specified soft tissue disorders: Secondary | ICD-10-CM

## 2024-04-09 DIAGNOSIS — D6869 Other thrombophilia: Secondary | ICD-10-CM | POA: Diagnosis not present

## 2024-04-09 DIAGNOSIS — J439 Emphysema, unspecified: Secondary | ICD-10-CM | POA: Diagnosis not present

## 2024-04-09 DIAGNOSIS — Z888 Allergy status to other drugs, medicaments and biological substances status: Secondary | ICD-10-CM | POA: Diagnosis not present

## 2024-04-09 DIAGNOSIS — Z85828 Personal history of other malignant neoplasm of skin: Secondary | ICD-10-CM | POA: Diagnosis not present

## 2024-04-09 DIAGNOSIS — N1831 Chronic kidney disease, stage 3a: Secondary | ICD-10-CM | POA: Diagnosis not present

## 2024-04-09 DIAGNOSIS — R0602 Shortness of breath: Secondary | ICD-10-CM | POA: Diagnosis not present

## 2024-04-09 DIAGNOSIS — I130 Hypertensive heart and chronic kidney disease with heart failure and stage 1 through stage 4 chronic kidney disease, or unspecified chronic kidney disease: Secondary | ICD-10-CM | POA: Diagnosis not present

## 2024-04-09 DIAGNOSIS — K219 Gastro-esophageal reflux disease without esophagitis: Secondary | ICD-10-CM | POA: Diagnosis not present

## 2024-04-09 DIAGNOSIS — Z66 Do not resuscitate: Secondary | ICD-10-CM | POA: Diagnosis not present

## 2024-04-09 DIAGNOSIS — I482 Chronic atrial fibrillation, unspecified: Secondary | ICD-10-CM | POA: Diagnosis not present

## 2024-04-09 DIAGNOSIS — I509 Heart failure, unspecified: Secondary | ICD-10-CM | POA: Insufficient documentation

## 2024-04-09 DIAGNOSIS — J441 Chronic obstructive pulmonary disease with (acute) exacerbation: Secondary | ICD-10-CM | POA: Diagnosis not present

## 2024-04-09 DIAGNOSIS — Z86718 Personal history of other venous thrombosis and embolism: Secondary | ICD-10-CM | POA: Diagnosis not present

## 2024-04-09 DIAGNOSIS — F32A Depression, unspecified: Secondary | ICD-10-CM | POA: Diagnosis not present

## 2024-04-09 DIAGNOSIS — Z7901 Long term (current) use of anticoagulants: Secondary | ICD-10-CM | POA: Diagnosis not present

## 2024-04-09 DIAGNOSIS — I4892 Unspecified atrial flutter: Secondary | ICD-10-CM | POA: Diagnosis not present

## 2024-04-09 DIAGNOSIS — I110 Hypertensive heart disease with heart failure: Secondary | ICD-10-CM | POA: Diagnosis not present

## 2024-04-09 DIAGNOSIS — I517 Cardiomegaly: Secondary | ICD-10-CM | POA: Diagnosis not present

## 2024-04-09 DIAGNOSIS — I5032 Chronic diastolic (congestive) heart failure: Secondary | ICD-10-CM

## 2024-04-09 DIAGNOSIS — E782 Mixed hyperlipidemia: Secondary | ICD-10-CM | POA: Diagnosis not present

## 2024-04-09 DIAGNOSIS — N1832 Chronic kidney disease, stage 3b: Secondary | ICD-10-CM | POA: Diagnosis not present

## 2024-04-09 DIAGNOSIS — E039 Hypothyroidism, unspecified: Secondary | ICD-10-CM

## 2024-04-09 DIAGNOSIS — I5033 Acute on chronic diastolic (congestive) heart failure: Secondary | ICD-10-CM | POA: Diagnosis not present

## 2024-04-09 DIAGNOSIS — I5031 Acute diastolic (congestive) heart failure: Secondary | ICD-10-CM | POA: Diagnosis not present

## 2024-04-09 DIAGNOSIS — I4821 Permanent atrial fibrillation: Secondary | ICD-10-CM | POA: Diagnosis not present

## 2024-04-09 DIAGNOSIS — Z885 Allergy status to narcotic agent status: Secondary | ICD-10-CM | POA: Diagnosis not present

## 2024-04-09 DIAGNOSIS — N179 Acute kidney failure, unspecified: Secondary | ICD-10-CM | POA: Diagnosis not present

## 2024-04-09 LAB — CBC WITH DIFFERENTIAL/PLATELET
Abs Immature Granulocytes: 0.01 10*3/uL (ref 0.00–0.07)
Basophils Absolute: 0.1 10*3/uL (ref 0.0–0.1)
Basophils Relative: 1 %
Eosinophils Absolute: 0.2 10*3/uL (ref 0.0–0.5)
Eosinophils Relative: 3 %
HCT: 44.4 % (ref 39.0–52.0)
Hemoglobin: 13.8 g/dL (ref 13.0–17.0)
Immature Granulocytes: 0 %
Lymphocytes Relative: 26 %
Lymphs Abs: 1.5 10*3/uL (ref 0.7–4.0)
MCH: 29.7 pg (ref 26.0–34.0)
MCHC: 31.1 g/dL (ref 30.0–36.0)
MCV: 95.7 fL (ref 80.0–100.0)
Monocytes Absolute: 0.6 10*3/uL (ref 0.1–1.0)
Monocytes Relative: 10 %
Neutro Abs: 3.5 10*3/uL (ref 1.7–7.7)
Neutrophils Relative %: 60 %
Platelets: 152 10*3/uL (ref 150–400)
RBC: 4.64 MIL/uL (ref 4.22–5.81)
RDW: 14.7 % (ref 11.5–15.5)
WBC: 5.8 10*3/uL (ref 4.0–10.5)
nRBC: 0 % (ref 0.0–0.2)

## 2024-04-09 LAB — BASIC METABOLIC PANEL WITH GFR
Anion gap: 8 (ref 5–15)
BUN: 24 mg/dL — ABNORMAL HIGH (ref 8–23)
CO2: 30 mmol/L (ref 22–32)
Calcium: 8.9 mg/dL (ref 8.9–10.3)
Chloride: 102 mmol/L (ref 98–111)
Creatinine, Ser: 1.24 mg/dL (ref 0.61–1.24)
GFR, Estimated: 56 mL/min — ABNORMAL LOW (ref >60)
Glucose, Bld: 98 mg/dL (ref 70–99)
Potassium: 3.8 mmol/L (ref 3.5–5.1)
Sodium: 140 mmol/L (ref 135–145)

## 2024-04-09 LAB — TSH: TSH: 2.539 u[IU]/mL (ref 0.350–4.500)

## 2024-04-09 LAB — ECHOCARDIOGRAM COMPLETE
Height: 66 in
S' Lateral: 2.6 cm
Weight: 3056 [oz_av]

## 2024-04-09 LAB — HEPATIC FUNCTION PANEL
ALT: 34 U/L (ref 0–44)
AST: 41 U/L (ref 15–41)
Albumin: 3.6 g/dL (ref 3.5–5.0)
Alkaline Phosphatase: 65 U/L (ref 38–126)
Bilirubin, Direct: 0.4 mg/dL — ABNORMAL HIGH (ref 0.0–0.2)
Indirect Bilirubin: 1.9 mg/dL — ABNORMAL HIGH (ref 0.3–0.9)
Total Bilirubin: 2.3 mg/dL — ABNORMAL HIGH (ref 0.0–1.2)
Total Protein: 6.9 g/dL (ref 6.5–8.1)

## 2024-04-09 MED ORDER — BENZONATATE 100 MG PO CAPS
100.0000 mg | ORAL_CAPSULE | Freq: Two times a day (BID) | ORAL | Status: DC | PRN
  Administered 2024-04-09 – 2024-04-11 (×3): 100 mg via ORAL
  Filled 2024-04-09 (×3): qty 1

## 2024-04-09 MED ORDER — IPRATROPIUM-ALBUTEROL 0.5-2.5 (3) MG/3ML IN SOLN
3.0000 mL | RESPIRATORY_TRACT | Status: DC
  Administered 2024-04-09 (×2): 3 mL via RESPIRATORY_TRACT
  Filled 2024-04-09 (×2): qty 3

## 2024-04-09 MED ORDER — PRAVASTATIN SODIUM 40 MG PO TABS
40.0000 mg | ORAL_TABLET | Freq: Every day | ORAL | Status: DC
  Administered 2024-04-09 – 2024-04-11 (×3): 40 mg via ORAL
  Filled 2024-04-09 (×3): qty 1

## 2024-04-09 MED ORDER — LEVOTHYROXINE SODIUM 25 MCG PO TABS
25.0000 ug | ORAL_TABLET | Freq: Every day | ORAL | Status: DC
  Administered 2024-04-09 – 2024-04-12 (×4): 25 ug via ORAL
  Filled 2024-04-09 (×4): qty 1

## 2024-04-09 MED ORDER — FUROSEMIDE 10 MG/ML IJ SOLN
80.0000 mg | Freq: Two times a day (BID) | INTRAMUSCULAR | Status: AC
  Administered 2024-04-09 (×2): 80 mg via INTRAVENOUS
  Filled 2024-04-09 (×2): qty 8

## 2024-04-09 MED ORDER — POTASSIUM CHLORIDE CRYS ER 20 MEQ PO TBCR
20.0000 meq | EXTENDED_RELEASE_TABLET | Freq: Every day | ORAL | Status: DC
  Administered 2024-04-09 – 2024-04-10 (×2): 20 meq via ORAL
  Filled 2024-04-09 (×2): qty 1

## 2024-04-09 MED ORDER — ACETAMINOPHEN 325 MG PO TABS
650.0000 mg | ORAL_TABLET | Freq: Four times a day (QID) | ORAL | Status: DC | PRN
  Administered 2024-04-09 – 2024-04-11 (×2): 650 mg via ORAL
  Filled 2024-04-09 (×2): qty 2

## 2024-04-09 MED ORDER — IPRATROPIUM-ALBUTEROL 0.5-2.5 (3) MG/3ML IN SOLN
3.0000 mL | Freq: Four times a day (QID) | RESPIRATORY_TRACT | Status: DC
  Administered 2024-04-09 – 2024-04-10 (×3): 3 mL via RESPIRATORY_TRACT
  Filled 2024-04-09 (×3): qty 3

## 2024-04-09 MED ORDER — SODIUM CHLORIDE 0.9 % IV SOLN
100.0000 mg | Freq: Two times a day (BID) | INTRAVENOUS | Status: DC
  Administered 2024-04-09: 100 mg via INTRAVENOUS
  Filled 2024-04-09: qty 100

## 2024-04-09 MED ORDER — METHYLPREDNISOLONE SODIUM SUCC 40 MG IJ SOLR
40.0000 mg | Freq: Two times a day (BID) | INTRAMUSCULAR | Status: AC
  Administered 2024-04-09: 40 mg via INTRAVENOUS
  Filled 2024-04-09: qty 1

## 2024-04-09 MED ORDER — APIXABAN 5 MG PO TABS
5.0000 mg | ORAL_TABLET | Freq: Two times a day (BID) | ORAL | Status: DC
  Administered 2024-04-09 – 2024-04-12 (×7): 5 mg via ORAL
  Filled 2024-04-09 (×7): qty 1

## 2024-04-09 MED ORDER — IPRATROPIUM-ALBUTEROL 0.5-2.5 (3) MG/3ML IN SOLN: 3.0000 mL | RESPIRATORY_TRACT | Status: DC | PRN

## 2024-04-09 MED ORDER — PERFLUTREN LIPID MICROSPHERE
1.0000 mL | INTRAVENOUS | Status: AC | PRN
Start: 2024-04-09 — End: 2024-04-09
  Administered 2024-04-09: 3 mL via INTRAVENOUS

## 2024-04-09 MED ORDER — GABAPENTIN 300 MG PO CAPS
300.0000 mg | ORAL_CAPSULE | Freq: Two times a day (BID) | ORAL | Status: DC
  Administered 2024-04-09 – 2024-04-12 (×7): 300 mg via ORAL
  Filled 2024-04-09 (×7): qty 1

## 2024-04-09 MED ORDER — METHYLPREDNISOLONE SODIUM SUCC 40 MG IJ SOLR
40.0000 mg | Freq: Two times a day (BID) | INTRAMUSCULAR | Status: DC
  Administered 2024-04-09: 40 mg via INTRAVENOUS
  Filled 2024-04-09: qty 1

## 2024-04-09 NOTE — Consult Note (Signed)
  WOC consult requested for right shin wound. Secure chat message sent to the primary team as follows: "Please note that the WOC nursing team is utilizing a standardized work plan to manage patient consults. We are triaging consults and will try to see the patients within 48 hours. Wound photos in the patient's chart allow us  to consult on the patient in the most efficient and timely manner."  Thank-you,  Wiliam Harder MSN, RN, [employer], [place], CNS [phone]

## 2024-04-09 NOTE — Consult Note (Signed)
 Cardiology Consultation   Patient ID: Eric [name] MRN: [id number]; DOB: [birth year]  Admit date: 04/08/2024 Date of Consult: 04/09/2024  PCP:  Verma Gobble, NP   [facility] Providers Cardiologist:  Belva Boyden, MD        Patient Profile:   Eric [name] is a 89 y.o. male with a hx of chronic HFpEF, COPD who is being seen 04/09/2024 for the evaluation of increased shortness of breath at the request of ED.  History of Present Illness:   Mr. Dudzik presented urgently to clinic today with increased SOB and LE edema that has worsened over the last 2-3 weeks.  He has home O2 but until recently wasn't needing it daily. Mild fever, cough, but nonproductive.  Says this feels like a COPD flare exacerbated by pollen.  Preserved EF, but dilated RV on echo last October, suggestive of pulmonary HTN, Afib with prior amiodarone  use, stopped due to thyroid  side effects.  Recently bradycardic so his metoprolol  was stopped.  In the ED, he also has a weeping leg wound on the R. Not bradycardic presently, BP a little elevated to 160s systolic  ECG shows rate controlled Afib, no clear ischemic changes.  CXR mostly unremarkable, and chest CT without evidence of PE. Cr is at baseline around 1.1, BNP 256 up from last measurement in October Troponins 14 to 18.  Past Medical History:  Diagnosis Date   Anxiety    Arthritis    Atypical chest pain    a. 05/2017 MV: no ischemia, EF 79%.   Chest pain 10/20/2017   Chronic diastolic CHF (congestive heart failure) (HCC)    a. 01/2011 Echo: EF 50-55%, gr1 DD, mild AI, nl RV fxn, mild TR/PR; b. 10/2017 Echo: EF 60-65%, mild LVH, gr2 DD.   DDD (degenerative disc disease), cervical    Depressive disorder, not elsewhere classified    Difficult intubation    Dysphagia, oral phase    Dyspnea    Edema    Gallstones    a. Symptomatic - s/p lap chole 05/2018.   History of DVT (deep vein thrombosis)    History of kidney stones    Hypertension     Hypoxemia    Impacted cerumen    Ischemic colitis (HCC)    a. 02/2018 GIB - colonoscopy w/ isch colitis. Anticoagulation resumed.   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 a. 01/2011 in setting of post-op complications including aspiration pna;  b. CHA2DS2VASc = 4--> Amio/Eliquis .   Pneumonia 03/06/2003   Spinal stenosis, unspecified region other than cervical    Squamous cell carcinoma of skin of trunk, except scrotum    skin cancer of shoulder   Syncope    a. 10/2017-->Event monitor: RSR, rare PACs/PVCs.   Thoracic or lumbosacral neuritis or radiculitis, unspecified     Past Surgical History:  Procedure Laterality Date   CARDIOVERSION N/A 06/20/2017   Procedure: CARDIOVERSION;  Surgeon: Devorah Fonder, MD;  Location: [facility] ORS;  Service: Cardiovascular;  Laterality: N/A;   CATARACT EXTRACTION W/ INTRAOCULAR LENS  IMPLANT, BILATERAL     CERVICAL FUSION  02/10/2011   CHOLECYSTECTOMY  05/24/2018   CHOLECYSTECTOMY N/A 05/24/2018   Procedure: LAPAROSCOPIC CHOLECYSTECTOMY;  Surgeon: Oza Blumenthal, MD;  Location[facility];  Service: General;  Laterality: N/A;   COLONOSCOPY WITH PROPOFOL  N/A 02/20/2018   Procedure: COLONOSCOPY WITH PROPOFOL ;  Surgeon: Marnee Sink, MD;  Location: [facility] ENDOSCOPY;  Service: Endoscopy;  Laterality: N/A;   history of abd ultrasound  11/01   fatty liver   MULTIPLE TOOTH EXTRACTIONS     ORIF FIBULA FRACTURE Left 01/06/2017   Procedure: OPEN REDUCTION INTERNAL FIXATION (ORIF) FIBULA FRACTURE DISTAL FIBULA;  Surgeon: Dayne Even, MD;  Location: [facility] OR;  Service: Orthopedics;  Laterality: Left;  Patient states has problems if he will have a tube in throat for Genera; Anesthesia        Inpatient Medications: Scheduled Meds:  Continuous Infusions:  PRN Meds:   Allergies:    Allergies  Allergen Reactions   Morphine And Codeine Shortness Of Breath   Percocet [Oxycodone-Acetaminophen ] Shortness Of Breath   Valium Shortness Of Breath    Social History:   Social History   Socioeconomic History   Marital status: Married    Spouse name: Not on file   Number of children: 2   Years of education: 12   Highest education level: High school graduate  Occupational History   Occupation: Retired 2006    Employer: RETIRED    Comment: Teacher, 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   Left-handed (due to arthritis, he uses his right hand more now).   Caffeine use: 2 cups per day.   Social Drivers of Corporate investment banker Strain: Low Risk  (03/09/2018)   Overall Financial Resource Strain (CARDIA)    Difficulty of Paying Living Expenses: Not hard at all  Food Insecurity: No Food Insecurity (09/21/2023)   Hunger Vital Sign    Worried About Running Out of Food in the Last Year: Never true    Ran Out of Food in the Last Year: Never true  Transportation Needs: No Transportation Needs (09/21/2023)   PRAPARE - Administrator, Civil Service (Medical): No    Lack of Transportation (Non-Medical): No  Physical Activity: Insufficiently Active (03/09/2018)   Exercise Vital Sign    Days of Exercise per Week: 7 days    Minutes of Exercise per Session: 10 min  Stress: No Stress Concern Present (03/09/2018)   [employer] of Occupational Health - Occupational Stress Questionnaire    Feeling of Stress :  Not at all  Social Connections: Moderately Integrated (03/09/2018)   Social Connection and Isolation Panel [NHANES]    Frequency of Communication with Friends and Family: More than three times a week    Frequency of Social Gatherings with Friends and Family: More than three times a week    Attends Religious Services: More than 4 times per year    Active Member of [employer] or Organizations: No    Attends Banker Meetings: Never    Marital Status: Married  Catering manager Violence: Not At Risk (09/16/2023)   Humiliation, Afraid, Rape, and  Kick questionnaire    Fear of Current or Ex-Partner: No    Emotionally Abused: No    Physically Abused: No    Sexually Abused: No    Family History:    Family History  Problem Relation Age of Onset   Stroke Father    Atrial fibrillation Brother        on coumadin   Heart disease Brother        AFib- coumadin   Stroke Paternal Grandfather    Other Mother        hemorrhage   Cancer Other        colon cancer at early age   Prostate cancer Neg Hx    Kidney cancer Neg Hx    Bladder Cancer Neg Hx      ROS:  Please see the history of present illness.   All other ROS reviewed and negative.     Physical Exam/Data:   Vitals:   04/08/24 1752 04/08/24 1830 04/08/24 1845 04/08/24 2125  BP:  (!) 168/86 (!) 152/84 (!) 156/68  Pulse:  (!) 30 (!) 48 63  Resp:  13 13 17   Temp:    97.7 F (36.5 C)  TempSrc:    Oral  SpO2: 94% 99% 99% 96%  Weight:      Height:       No intake or output data in the 24 hours ending 04/09/24 0021    04/08/2024    5:36 PM 04/08/2024    3:44 PM 04/03/2024    2:46 PM  Last 3 Weights  Weight (lbs) 191 lb 191 lb 12.8 oz 195 lb 9.6 oz  Weight (kg) 86.637 kg 87 kg 88.724 kg     Body mass index is 30.83 kg/m.  General:  Well nourished, well developed, elderly man, somewhat frail HEENT: normal Neck: JVD to the ear Vascular: No carotid bruits; Distal pulses palpable Cardiac:  irregular no murmur  appreciated Lungs:  some wheezes anteriorly Abd: soft, nontender, no hepatomegaly  Ext: 2+ B edema Musculoskeletal:  No deformities, BUE and BLE strength normal and equal Skin: warm, open wound on R leg. Neuro:  CNs 2-12 grossly intact, no focal abnormalities noted Psych:  Normal affect    Laboratory Data:  High Sensitivity Troponin:   Recent Labs  Lab 04/08/24 1749 04/08/24 2042  TROPONINIHS 14 18*     Chemistry Recent Labs  Lab 04/03/24 1521 04/08/24 2042  NA 143 141  K 4.2 4.1  CL 108 106  CO2 28 28  GLUCOSE 90 88  BUN 30* 26*  CREATININE 1.25* 1.12  CALCIUM 9.2 8.7*  GFRNONAA  --  >60  ANIONGAP  --  7    No results for input(s): "PROT", "ALBUMIN", "AST", "ALT", "ALKPHOS", "BILITOT" in the last 168 hours. Lipids No results for input(s): "CHOL", "TRIG", "HDL", "LABVLDL", "LDLCALC", "CHOLHDL" in the last 168 hours.  Hematology Recent Labs  Lab 04/03/24 1521 04/08/24 1749  WBC 6.7 6.5  RBC 4.38 4.80  HGB 12.9* 14.4  HCT 40.9 46.4  MCV 93.4 96.7  MCH 29.5 30.0  MCHC 31.5* 31.0  RDW 13.0 14.7  PLT 155 209   Thyroid  No results for input(s): "TSH", "FREET4" in the last 168 hours.  BNP Recent Labs  Lab 04/03/24 1521 04/08/24 1750  BNP 333* 255.7*    DDimer No results for input(s): "DDIMER" in the last 168 hours.   Radiology/Studies:  CT Angio Chest PE W and/or Wo Contrast Result Date: 04/08/2024 EXAM: CTA of the  Chest without and with IV contrast for PE 04/08/2024 11:21:27 PM TECHNIQUE: CTA of the chest was performed after the administration of intravenous contrast. Multiplanar reformatted images are provided for review. MIP images are provided for review. Automated exposure control, iterative reconstruction, and/or weight based adjustment of the mA/kV was utilized to reduce the radiation dose to as low as reasonably achievable. COMPARISON: Chest radiograph earlier today and CT chest dated 09/15/2023. CLINICAL HISTORY: Pulmonary embolism (PE) suspected,  high probability. Patient went to heart MD and was noted to be hypoxic and bradycardic. Patient reports increased shortness of breath and bradycardia with PVC. Patient has bilateral leg swelling that is weeping. Contrast administered: 75mL (iohexol  (OMNIPAQUE ) 350 MG/ML injection 75 mL IOHEXOL  350 MG/ML SOLN). FINDINGS: PULMONARY ARTERIES: Pulmonary arteries are adequately opacified for evaluation. No evidence of pulmonary embolism. Main pulmonary artery is normal in caliber. MEDIASTINUM: No evidence of mediastinal lymphadenopathy. There is no acute abnormality of the thoracic aorta. Cardiomegaly. Atherosclerotic calcification of the aortic arch. Moderate coronary arthrosis of the LAD. LYMPH NODES: No evidence of mediastinal, hilar or axillary lymphadenopathy. LUNGS AND PLEURA: Evaluation of the lung parenchyma is constrained by respiratory motion. Scattered dependent patchy opacities, favoring atelectasis. Small right and trace left pleural effusions. UPPER ABDOMEN: Calcified splenic granulomata. Status post cholecystectomy. SOFT TISSUES AND BONES: No acute bone or soft tissue abnormality. IMPRESSION: 1. No evidence of pulmonary embolism. 2. Cardiomegaly. Small right and trace left pleural effusions. 3. Scattered dependent patchy opacities, favoring atelectasis. Electronically signed by: Zadie Herter MD 04/08/2024 11:25 PM EDT RP Workstation: [id number]   DG Chest Port 1 View Result Date: 04/08/2024 CLINICAL DATA:  Shortness of breath. EXAM: PORTABLE CHEST 1 VIEW COMPARISON:  Chest radiograph dated 12/15/2023. FINDINGS: No focal consolidation, pleural effusion, pneumothorax. Mild cardiomegaly. Degenerative changes of the spine. No acute osseous pathology. IMPRESSION: 1. No active disease. 2. Mild cardiomegaly. Electronically Signed   By: Angus Bark M.D.   On: 04/08/2024 18:55     Assessment and Plan:  SOB likely mixed etiology from COPD with some contribution from acute on chronic diastolic heart  failure.  Agree with IV diuresis tonight - suggest lasix  80 IV bid. Will defer to internal medicine whether he needs steroids/abx for potential COPD flare.  Although not bradycardic presently, would continue to hold BB. I dont think he needs another echo as it was just done last October, and no evidence of an acute infarct. No indication for heparin currently. Suggest checking some LFTs (not checked since last october0  Continue anticoagulation for underlying Afib.   Risk Assessment/Risk Scores:        [place]  Heart Association (NYHA) Functional Class NYHA Class III             For questions or updates, please contact [facility] Please consult [email or web address] for contact info under    Signed, Philmore Bream, MD  04/09/2024 12:21 AM

## 2024-04-09 NOTE — TOC CM/SW Note (Addendum)
 Transition of Care [facility]) - Inpatient Brief Assessment   Patient Details  Name: Eric [name] MRN: [id number] Date of Birth: [birth year]  Transition of Care [facility]) CM/SW Contact:    Jennett Model, RN Phone Number: 04/09/2024, 2:56 PM   Clinical Narrative: From home with spouse, has PCP and insurance on file, states has no HH services in place at this time, but has had [facility] in the past and if he needs any HH he would like [facility], Also patient called [facility] to notify them he is in the hospital, and Thurston Flow informed me if he needs any HH they will take care of it for him.  He has rolling walker, rollator, home oxygen  at night 4 liters thru [employer], and neb machine at home.  States his grand daughter , Murvin Arthurs will transport them home at [employer] and family is support system, states gets medications from [facility] on [address] in [place].  Pta self ambulatory with walker.  Patient gives this NCM permission to speak with wife, grand daughter and son and his wife.     Transition of Care Asessment: Insurance and Status: Insurance coverage has been reviewed Patient has primary care physician: Yes Home environment has been reviewed: home with wife Prior level of function:: ambulates with walker Prior/Current Home Services: Current home services (rolling walker, rollator, oxygen  at night 4 liters thru [employer], neb machine) Social Drivers of Health Review: SDOH reviewed no interventions necessary Readmission risk has been reviewed: Yes Transition of care needs: no transition of care needs at this time

## 2024-04-09 NOTE — Progress Notes (Signed)
 Rounding Note    Patient Name: Eric [name] Date of Encounter: 04/10/2024  [facility] Cardiologist: Timothy Gollan, MD   Subjective   Pt seen after neb with RT. He diuresed briskly last evening, some urine output unmeasured. Feels his breathing is improved.  Inpatient Medications    Scheduled Meds:  apixaban   5 mg Oral BID   gabapentin   300 mg Oral BID   ipratropium-albuterol   3 mL Nebulization Q6H   levothyroxine   25 mcg Oral Q0600   potassium chloride  SA  20 mEq Oral Daily   pravastatin   40 mg Oral QHS   Continuous Infusions:   PRN Meds: acetaminophen , benzonatate , ipratropium-albuterol    Vital Signs    Vitals:   04/09/24 2044 04/10/24 0124 04/10/24 0446 04/10/24 0723  BP:  106/61 (!) 109/54 (!) 120/49  Pulse:  66 60 (!) 53  Resp:  16 20 16   Temp:  98.1 F (36.7 C) 97.6 F (36.4 C) (!) 96.9 F (36.1 C)  TempSrc:  Axillary Oral Axillary  SpO2: 97% 90% 96% 91%  Weight:   82.5 kg   Height:        Intake/Output Summary (Last 24 hours) at 04/10/2024 0742 Last data filed at 04/10/2024 0445 Gross per 24 hour  Intake 700 ml  Output 2750 ml  Net -2050 ml      04/10/2024    4:46 AM 04/09/2024    2:13 PM 04/08/2024    5:36 PM  Last 3 Weights  Weight (lbs) 181 lb 14.1 oz 182 lb 12.2 oz 191 lb  Weight (kg) 82.5 kg 82.9 kg 86.637 kg      Telemetry    Rate controlled atrial fibrillation with episodes of bradycardia in the 40-50s - Personally Reviewed  ECG    No new tracings - Personally Reviewed  Physical Exam   GEN: No acute distress.   Neck: minimal JVD Cardiac: RRR, no murmurs, rubs, or gallops.  Respiratory: rhonchi on left, respirations unlabored GI: Soft, nontender, non-distended  MS: mild LE edema with chronic skin changes Neuro:  Nonfocal  Psych: Normal affect   Labs    High Sensitivity Troponin:   Recent Labs  Lab 04/08/24 1749 04/08/24 2042  TROPONINIHS 14 18*     Chemistry Recent Labs  Lab 04/08/24 2042 04/09/24 0508  04/10/24 0227  NA 141 140 139  K 4.1 3.8 4.0  CL 106 102 98  CO2 28 30 29   GLUCOSE 88 98 133*  BUN 26* 24* 28*  CREATININE 1.12 1.24 1.43*  CALCIUM 8.7* 8.9 9.0  PROT  --  6.9  --   ALBUMIN  --  3.6  --   AST  --  41  --   ALT  --  34  --   ALKPHOS  --  65  --   BILITOT  --  2.3*  --   GFRNONAA >60 56* 47*  ANIONGAP 7 8 12     Lipids No results for input(s): "CHOL", "TRIG", "HDL", "LABVLDL", "LDLCALC", "CHOLHDL" in the last 168 hours.  Hematology Recent Labs  Lab 04/03/24 1521 04/08/24 1749 04/09/24 0508  WBC 6.7 6.5 5.8  RBC 4.38 4.80 4.64  HGB 12.9* 14.4 13.8  HCT 40.9 46.4 44.4  MCV 93.4 96.7 95.7  MCH 29.5 30.0 29.7  MCHC 31.5* 31.0 31.1  RDW 13.0 14.7 14.7  PLT 155 209 152   Thyroid   Recent Labs  Lab 04/09/24 0508  TSH 2.539    BNP Recent Labs  Lab  04/03/24 1521 04/08/24 1750  BNP 333* 255.7*    DDimer No results for input(s): "DDIMER" in the last 168 hours.   Radiology    ECHOCARDIOGRAM COMPLETE Result Date: 04/09/2024    ECHOCARDIOGRAM REPORT   Patient Name:   Eric [name] [name] Date of Exam: 04/09/2024 Medical Rec #:  [id number]  Height: Accession #:    [id number] Weight: Date of Birth:  November 19, 1932   BSA: Patient Age:    89 years   BP:           139/86 mmHg Patient Gender: M          HR:           64 bpm. Exam Location:  Inpatient Procedure: 2D Echo, Color Doppler and Cardiac Doppler (Both Spectral and Color            Flow Doppler were utilized during procedure). Indications:    Acute CHF  History:        Patient has prior history of Echocardiogram examinations, most                 recent 09/15/2023. CHF, Arrythmias:Atrial Fibrillation; Risk                 Factors:Hypertension.  Sonographer:    Janette Medley Referring Phys: [id number] Angelene Kelly  Sonographer Comments: Technically difficult study due to poor echo windows. IMPRESSIONS  1. Left ventricular ejection fraction, by estimation, is 55 to 60%. The left ventricle has normal function. The left ventricle has  no regional wall motion abnormalities. There is mild concentric left ventricular hypertrophy. Left ventricular diastolic function could not be evaluated.  2. Right ventricular systolic function is normal. The right ventricular size is normal. There is mildly elevated pulmonary artery systolic pressure.  3. Left atrial size was severely dilated.  4. Right atrial size was severely dilated.  5. The mitral valve is grossly normal. Mild to moderate mitral valve regurgitation. No evidence of mitral stenosis.  6. The aortic valve is tricuspid. There is mild calcification of the aortic valve. Aortic valve regurgitation is mild. Aortic valve sclerosis/calcification is present, without any evidence of aortic stenosis.  7. The inferior vena cava is dilated in size with <50% respiratory variability, suggesting right atrial pressure of 15 mmHg. Comparison(s): Prior images reviewed side by side. The right ventricular systolic function has improved. FINDINGS  Left Ventricle: Left ventricular ejection fraction, by estimation, is 55 to 60%. The left ventricle has normal function. The left ventricle has no regional wall motion abnormalities. Definity  contrast agent was given IV to delineate the left ventricular  endocardial borders. The left ventricular internal cavity size was normal in size. There is mild concentric left ventricular hypertrophy. Left ventricular diastolic function could not be evaluated due to atrial fibrillation. Left ventricular diastolic function could not be evaluated. Right Ventricle: The right ventricular size is normal. Right vetricular wall thickness was not well visualized. Right ventricular systolic function is normal. There is mildly elevated pulmonary artery systolic pressure. The tricuspid regurgitant velocity  is 2.55 m/s, and with an assumed right atrial pressure of 15 mmHg, the estimated right ventricular systolic pressure is 41.0 mmHg. Left Atrium: Left atrial size was severely dilated. Right  Atrium: Right atrial size was severely dilated. Pericardium: There is no evidence of pericardial effusion. Mitral Valve: The mitral valve is grossly normal. Mild to moderate mitral valve regurgitation, with posteriorly-directed jet. No evidence of mitral valve stenosis. Tricuspid Valve: The tricuspid valve is grossly normal. Tricuspid  valve regurgitation is trivial. Aortic Valve: The aortic valve is tricuspid. There is mild calcification of the aortic valve. Aortic valve regurgitation is mild. Aortic valve sclerosis/calcification is present, without any evidence of aortic stenosis. Pulmonic Valve: The pulmonic valve was grossly normal. Pulmonic valve regurgitation is mild. No evidence of pulmonic stenosis. Aorta: The aortic root and ascending aorta are structurally normal, with no evidence of dilitation. Venous: The inferior vena cava is dilated in size with less than 50% respiratory variability, suggesting right atrial pressure of 15 mmHg. IAS/Shunts: The interatrial septum was not well visualized.  LEFT VENTRICLE PLAX 2D LVIDd:         4.60 cm LVIDs:         2.60 cm LV PW:         1.20 cm LV IVS:        1.20 cm LVOT diam:     2.10 cm LVOT Area:     3.46 cm   AORTA Ao Root diam: 2.80 cm Ao Desc diam: 3.30 cm MV E velocity: 90.70 cm/s    TRICUSPID VALVE MV A velocity: 2910.00 cm/s  TR Peak grad:   26.0 mmHg MV E/A ratio:  0.03          TR Vmax:        255.00 cm/s                               SHUNTS                              Systemic Diam: 2.10 cm Karyl Paget Croitoru MD Electronically signed by Luana Rumple MD Signature Date/Time: 04/09/2024/2:27:58 PM    Final    VAS US  LOWER EXTREMITY VENOUS (DVT) (7a-7p) Result Date: 04/09/2024  Lower Venous DVT Study Patient Name:  TYMEIR HUDECEK Goodchild  Date of Exam:   04/09/2024 Medical Rec #: [id number]   Accession #:    [id number] Date of Birth: 20-Sep-1932    Patient Gender: M Patient Age:   13 years Exam Location:  [facility] Procedure:      VAS US  LOWER EXTREMITY VENOUS (DVT)  Referring Phys: Melford Spotted --------------------------------------------------------------------------------  Indications: SOB & increased leg swelling.  Anticoagulation: Eliquis . Performing Technologist: Arlyce Berger RVT, RDMS  Examination Guidelines: A complete evaluation includes B-mode imaging, spectral Doppler, color Doppler, and power Doppler as needed of all accessible portions of each vessel. Bilateral testing is considered an integral part of a complete examination. Limited examinations for reoccurring indications Gott be performed as noted. The reflux portion of the exam is performed with the patient in reverse Trendelenburg.  +---------+---------------+---------+-----------+----------+-------------------+ RIGHT    CompressibilityPhasicitySpontaneityPropertiesThrombus Aging      +---------+---------------+---------+-----------+----------+-------------------+ CFV      Full           Yes      Yes                                      +---------+---------------+---------+-----------+----------+-------------------+ SFJ      Full                                                             +---------+---------------+---------+-----------+----------+-------------------+  FV Prox  Full           Yes      Yes                                      +---------+---------------+---------+-----------+----------+-------------------+ FV Mid   Full           Yes      Yes                                      +---------+---------------+---------+-----------+----------+-------------------+ FV DistalFull           Yes      Yes                                      +---------+---------------+---------+-----------+----------+-------------------+ PFV      Full                                                             +---------+---------------+---------+-----------+----------+-------------------+ POP      Full           Yes      Yes                                       +---------+---------------+---------+-----------+----------+-------------------+ PTV      Full                                         Not well visualized +---------+---------------+---------+-----------+----------+-------------------+ PERO     Full                                         Not well visualized +---------+---------------+---------+-----------+----------+-------------------+   +----+---------------+---------+-----------+----------+--------------+ LEFTCompressibilityPhasicitySpontaneityPropertiesThrombus Aging +----+---------------+---------+-----------+----------+--------------+ CFV Full           Yes      Yes                                 +----+---------------+---------+-----------+----------+--------------+     Summary: RIGHT: - There is no evidence of deep vein thrombosis in the lower extremity.  - No cystic structure found in the popliteal fossa. subcutaneous edema seen in calf and ankle.  LEFT: - No evidence of common femoral vein obstruction.   *See table(s) above for measurements and observations.    Preliminary    CT Angio Chest PE W and/or Wo Contrast Result Date: 04/08/2024 EXAM: CTA of the Chest without and with IV contrast for PE 04/08/2024 11:21:27 PM TECHNIQUE: CTA of the chest was performed after the administration of intravenous contrast. Multiplanar reformatted images are provided for review. MIP images are provided for review. Automated exposure control, iterative reconstruction, and/or weight based adjustment of the mA/kV was utilized to reduce the radiation dose to  as low as reasonably achievable. COMPARISON: Chest radiograph earlier today and CT chest dated 09/15/2023. CLINICAL HISTORY: Pulmonary embolism (PE) suspected, high probability. Patient went to heart MD and was noted to be hypoxic and bradycardic. Patient reports increased shortness of breath and bradycardia with PVC. Patient has bilateral leg swelling that is weeping. Contrast  administered: 75mL (iohexol  (OMNIPAQUE ) 350 MG/ML injection 75 mL IOHEXOL  350 MG/ML SOLN). FINDINGS: PULMONARY ARTERIES: Pulmonary arteries are adequately opacified for evaluation. No evidence of pulmonary embolism. Main pulmonary artery is normal in caliber. MEDIASTINUM: No evidence of mediastinal lymphadenopathy. There is no acute abnormality of the thoracic aorta. Cardiomegaly. Atherosclerotic calcification of the aortic arch. Moderate coronary arthrosis of the LAD. LYMPH NODES: No evidence of mediastinal, hilar or axillary lymphadenopathy. LUNGS AND PLEURA: Evaluation of the lung parenchyma is constrained by respiratory motion. Scattered dependent patchy opacities, favoring atelectasis. Small right and trace left pleural effusions. UPPER ABDOMEN: Calcified splenic granulomata. Status post cholecystectomy. SOFT TISSUES AND BONES: No acute bone or soft tissue abnormality. IMPRESSION: 1. No evidence of pulmonary embolism. 2. Cardiomegaly. Small right and trace left pleural effusions. 3. Scattered dependent patchy opacities, favoring atelectasis. Electronically signed by: Zadie Herter MD 04/08/2024 11:25 PM EDT RP Workstation: [id number]   DG Chest Port 1 View Result Date: 04/08/2024 CLINICAL DATA:  Shortness of breath. EXAM: PORTABLE CHEST 1 VIEW COMPARISON:  Chest radiograph dated 12/15/2023. FINDINGS: No focal consolidation, pleural effusion, pneumothorax. Mild cardiomegaly. Degenerative changes of the spine. No acute osseous pathology. IMPRESSION: 1. No active disease. 2. Mild cardiomegaly. Electronically Signed   By: Angus Bark M.D.   On: 04/08/2024 18:55    Cardiac Studies   Echo 04/09/24:  1. Left ventricular ejection fraction, by estimation, is 55 to 60%. The  left ventricle has normal function. The left ventricle has no regional  wall motion abnormalities. There is mild concentric left ventricular  hypertrophy. Left ventricular diastolic  function could not be evaluated.   2. Right  ventricular systolic function is normal. The right ventricular  size is normal. There is mildly elevated pulmonary artery systolic  pressure.   3. Left atrial size was severely dilated.   4. Right atrial size was severely dilated.   5. The mitral valve is grossly normal. Mild to moderate mitral valve  regurgitation. No evidence of mitral stenosis.   6. The aortic valve is tricuspid. There is mild calcification of the  aortic valve. Aortic valve regurgitation is mild. Aortic valve  sclerosis/calcification is present, without any evidence of aortic  stenosis.   7. The inferior vena cava is dilated in size with <50% respiratory  variability, suggesting right atrial pressure of 15 mmHg.   Echo 09/2023:  1. Left ventricular ejection fraction, by estimation, is 60 to 65%. The  left ventricle has normal function. The left ventricle has no regional  wall motion abnormalities. Left ventricular diastolic parameters are  indeterminate.   2. Right ventricular systolic function is moderately reduced. The right  ventricular size is moderately enlarged. There is mildly elevated  pulmonary artery systolic pressure.   3. Left atrial size was severely dilated.   4. Right atrial size was severely dilated.   5. The mitral valve is normal in structure. Mild mitral valve  regurgitation. No evidence of mitral stenosis.   6. The aortic valve is normal in structure. Aortic valve regurgitation is  trivial. No aortic stenosis is present.   7. The inferior vena cava is dilated in size with <50% respiratory  variability, suggesting right atrial  pressure of 15 mmHg.   Patient Profile     89 y.o. male with a hx of persistent Afib, PVCs, remote tobacco abuse, dilated ascending aorta, chronic diastolic heart failure, chronic respiratory failure on 4L [place] at home, COPD, HLD, HTN, hypothyroidism. She was seen in clinic 04/08/24 with bradycardia in the 30s, and persistent leg edema and worsening SOB. Was seen by PCP last  week and lasix  increased to 40 mg daily without improvement. Seen by Cadence Gennaro [facility] and sent to ER for further management. They opted to come to [facility].  Assessment & Plan    Acute on chronic diastolic heart failure Moderately reduced RV function - known Grade 2 DD - admitted with LE edema, worsening hypoxia and SOB - BNP 256 - has received 80 mg IV lasix  BID - diuresed 2750 cc urine output yesterday, weight down 10 lbs - creatinine bumped today, will hold diuresis today, appears euvolemic, chronic lower extremity swelling - will stop IV lasix  today and resume home 40 mg lasix  PO tomorrow   Chest pain - reports chest pain with walking - hs troponin 14 --> 18 - reassuring nuclear stress test without perfusion defects - no plans for ischemic evaluation this admission   Permanent Atrial fibrillation  Atrial fibrillation with slow ventricular rate - EKG with VR 52 - telemetry with rates 30-50s - hold metoprolol  - lopressor  stopped by PCP 04/03/24 - amiodarone  has been discontinued due to thyroid  disease - TSH WNL   Chronic anticoagulation - doing well on eliquis    COPD Acute on chronic respiratory failure on 4 L  at baseline - pt reports subjective fevers, suspects COPD exacerbation related to pollen - suspect SOB likely related to COPD exacerbation       For questions or updates, please contact [facility] Please consult [email or web address] for contact info under        Signed, Lamond Pilot, PA  04/10/2024, 7:42 AM

## 2024-04-09 NOTE — Progress Notes (Signed)
 RLE venous duplex has been completed.   Results can be found under chart review under CV PROC. 04/09/2024 12:29 PM Aalaysia Liggins RVT, RDMS

## 2024-04-09 NOTE — Progress Notes (Signed)
 PROGRESS NOTE    Eric [name]  ZOX:[id number] DOB: 1932-04-11 DOA: 04/08/2024 PCP: Verma Gobble, NP  89/M w  COPD on 4 L oxygen , diastolic CHF, RV failure, hypothyroidism, hyperlipidemia, neuropathy has been experiencing shortness of breath for the last 2 weeks at rest and exertion.  Also noticed increasing lower extremity edema.  Recently patient has been bradycardic and his beta-blockers were discontinued.  Also has intermittent cough and wheezing In ED, CTA chest>negative for PE , bilateral pleural effusion.  Patient also was found to be wheezing. BNP of 255 troponins 14 and 18.    Subjective: -feels better, breathing starting to improve  Assessment and Plan:  Acute on chronic diastolic CHF RV failure -last echo 10/24 EF 60 to 65%, mod reduced RV -continue IV lasix   -add aldactone -BMP in am  COPD exacerbation -cut down IV steroids, nebs -DC abx -on 4L O2 at baseline  Paroxysmal atrial fibrillation presently in slow A-fib.  Patient's beta-blockers was recently discontinued.  Not on any rate limiting medication at this time.   -continue eliquis    Hypothyroidism on Synthroid .  Hyperlipidemia on statins.  GERD on PPI.  Neuropathy on gabapentin .    DVT prophylaxis:eliquis  Code Status: DNR Family Communication: none present Disposition Plan: Home in 1-2days  Consultants:    Procedures:   Antimicrobials:    Objective: Vitals:   04/09/24 0530 04/09/24 0550 04/09/24 0700 04/09/24 0800  BP: (!) 161/85  (!) 150/93 139/86  Pulse: (!) 40  (!) 33 60  Resp:      Temp:  97.6 F (36.4 C)    TempSrc:  Oral    SpO2: 93%  100% 91%  Weight:      Height:        Intake/Output Summary (Last 24 hours) at 04/09/2024 0917 Last data filed at 04/09/2024 0620 Gross per 24 hour  Intake --  Output 875 ml  Net -875 ml   Filed Weights   04/08/24 1736  Weight: 86.6 kg    Examination:  General exam: Appears calm and comfortable , AAOx3 HEENT: pos JVD Respiratory  system: scattered rales and improving air movt Cardiovascular system: S1 & S2 heard, RRR.  Abd: nondistended, soft and nontender.Normal bowel sounds heard. Extremities: 1plus edema Skin: chronic skin changes Psychiatry:  Mood & affect appropriate.     Data Reviewed:   CBC: Recent Labs  Lab 04/03/24 1521 04/08/24 1749 04/09/24 0508  WBC 6.7 6.5 5.8  NEUTROABS 4,040 3.7 3.5  HGB 12.9* 14.4 13.8  HCT 40.9 46.4 44.4  MCV 93.4 96.7 95.7  PLT 155 209 152   Basic Metabolic Panel: Recent Labs  Lab 04/03/24 1521 04/08/24 2042 04/09/24 0508  NA 143 141 140  K 4.2 4.1 3.8  CL 108 106 102  CO2 28 28 30   GLUCOSE 90 88 98  BUN 30* 26* 24*  CREATININE 1.25* 1.12 1.24  CALCIUM 9.2 8.7* 8.9   GFR: Estimated Creatinine Clearance: 41.6 mL/min (by C-G formula based on SCr of 1.24 mg/dL). Liver Function Tests: Recent Labs  Lab 04/09/24 0508  AST 41  ALT 34  ALKPHOS 65  BILITOT 2.3*  PROT 6.9  ALBUMIN 3.6   No results for input(s): "LIPASE", "AMYLASE" in the last 168 hours. No results for input(s): "AMMONIA" in the last 168 hours. Coagulation Profile: No results for input(s): "INR", "PROTIME" in the last 168 hours. Cardiac Enzymes: No results for input(s): "CKTOTAL", "CKMB", "CKMBINDEX", "TROPONINI" in the last 168 hours. BNP (last 3 results) No results  for input(s): "PROBNP" in the last 8760 hours. HbA1C: No results for input(s): "HGBA1C" in the last 72 hours. CBG: No results for input(s): "GLUCAP" in the last 168 hours. Lipid Profile: No results for input(s): "CHOL", "HDL", "LDLCALC", "TRIG", "CHOLHDL", "LDLDIRECT" in the last 72 hours. Thyroid  Function Tests: Recent Labs    04/09/24 0508  TSH 2.539   Anemia Panel: No results for input(s): "VITAMINB12", "FOLATE", "FERRITIN", "TIBC", "IRON", "RETICCTPCT" in the last 72 hours. Urine analysis:    Component Value Date/Time   COLORURINE YELLOW 02/28/2019 1652   APPEARANCEUR CLEAR 02/28/2019 1652   APPEARANCEUR  Clear 07/27/2017 0954   LABSPEC 1.020 02/28/2019 1652   PHURINE 5.0 02/28/2019 1652   GLUCOSEU NEGATIVE 02/28/2019 1652   HGBUR NEGATIVE 02/28/2019 1652   BILIRUBINUR Orange 11/02/2020 1241   BILIRUBINUR Negative 07/27/2017 0954   KETONESUR NEGATIVE 02/28/2019 1652   PROTEINUR Positive (A) 11/02/2020 1241   PROTEINUR NEGATIVE 02/28/2019 1652   UROBILINOGEN 0.2 11/02/2020 1241   UROBILINOGEN 0.2 02/02/2011 1025   NITRITE Negative 11/02/2020 1241   NITRITE NEGATIVE 02/28/2019 1652   LEUKOCYTESUR Negative 11/02/2020 1241   LEUKOCYTESUR NEGATIVE 02/28/2019 1652   Sepsis Labs: @LABRCNTIP (procalcitonin:4,lacticidven:4)  )No results found for this or any previous visit (from the past 240 hours).   Radiology Studies: CT Angio Chest PE W and/or Wo Contrast Result Date: 04/08/2024 EXAM: CTA of the Chest without and with IV contrast for PE 04/08/2024 11:21:27 PM TECHNIQUE: CTA of the chest was performed after the administration of intravenous contrast. Multiplanar reformatted images are provided for review. MIP images are provided for review. Automated exposure control, iterative reconstruction, and/or weight based adjustment of the mA/kV was utilized to reduce the radiation dose to as low as reasonably achievable. COMPARISON: Chest radiograph earlier today and CT chest dated 09/15/2023. CLINICAL HISTORY: Pulmonary embolism (PE) suspected, high probability. Patient went to heart MD and was noted to be hypoxic and bradycardic. Patient reports increased shortness of breath and bradycardia with PVC. Patient has bilateral leg swelling that is weeping. Contrast administered: 75mL (iohexol  (OMNIPAQUE ) 350 MG/ML injection 75 mL IOHEXOL  350 MG/ML SOLN). FINDINGS: PULMONARY ARTERIES: Pulmonary arteries are adequately opacified for evaluation. No evidence of pulmonary embolism. Main pulmonary artery is normal in caliber. MEDIASTINUM: No evidence of mediastinal lymphadenopathy. There is no acute abnormality of the  thoracic aorta. Cardiomegaly. Atherosclerotic calcification of the aortic arch. Moderate coronary arthrosis of the LAD. LYMPH NODES: No evidence of mediastinal, hilar or axillary lymphadenopathy. LUNGS AND PLEURA: Evaluation of the lung parenchyma is constrained by respiratory motion. Scattered dependent patchy opacities, favoring atelectasis. Small right and trace left pleural effusions. UPPER ABDOMEN: Calcified splenic granulomata. Status post cholecystectomy. SOFT TISSUES AND BONES: No acute bone or soft tissue abnormality. IMPRESSION: 1. No evidence of pulmonary embolism. 2. Cardiomegaly. Small right and trace left pleural effusions. 3. Scattered dependent patchy opacities, favoring atelectasis. Electronically signed by: Zadie Herter MD 04/08/2024 11:25 PM EDT RP Workstation: [id number]   DG Chest Port 1 View Result Date: 04/08/2024 CLINICAL DATA:  Shortness of breath. EXAM: PORTABLE CHEST 1 VIEW COMPARISON:  Chest radiograph dated 12/15/2023. FINDINGS: No focal consolidation, pleural effusion, pneumothorax. Mild cardiomegaly. Degenerative changes of the spine. No acute osseous pathology. IMPRESSION: 1. No active disease. 2. Mild cardiomegaly. Electronically Signed   By: Angus Bark M.D.   On: 04/08/2024 18:55     Scheduled Meds:  apixaban   5 mg Oral BID   furosemide   80 mg Intravenous Q12H   gabapentin   300 mg Oral BID  ipratropium-albuterol   3 mL Nebulization Q4H   levothyroxine   25 mcg Oral Q0600   methylPREDNISolone  (SOLU-MEDROL ) injection  40 mg Intravenous Q12H   potassium chloride  SA  20 mEq Oral Daily   pravastatin   40 mg Oral QHS   Continuous Infusions:  doxycycline  (VIBRAMYCIN ) IV Stopped (04/09/24 0827)     LOS: 0 days    Time spent:    Deforest Fast, MD [facility]   04/09/2024, 9:17 AM

## 2024-04-09 NOTE — H&P (Signed)
 History and Physical    Eric [name] [name]:[id number] DOB: March 03, 1932 DOA: 04/08/2024  Patient coming from: Home.  Chief Complaint: Shortness of breath.  HPI: Eric [name] is a 89 y.o. male with history of COPD on 4 L oxygen , diastolic CHF, hypothyroidism, hyperlipidemia, neuropathy has been experiencing shortness of breath for the last 2 weeks at rest and exertion.  Also noticed increasing lower extremity edema.  Recently patient has been bradycardic and his beta-blockers were discontinued.  Patient had followed-up with cardiology office and was referred to the ER because of his shortness of breath and hypoxia.  Denies chest pain productive cough fever or chills.  Due to his shortness of breath recently his primary care physician has increased his Lasix  dose.  ED Course: In the ER patient had CT angiogram of the chest which was negative for PE but does show bilateral pleural effusion.  Patient also was found to be wheezing.  Patient is on 4 L oxygen  which is at baseline.  Labs show BNP of 255 troponins 14 and 18.  Patient was given IV Lasix  80 mg and admitted for acute CHF with COPD exacerbation cardiology was consulted.  Review of Systems: As per HPI, rest all negative.   Past Medical History:  Diagnosis Date   Anxiety    Arthritis    Atypical chest pain    a. 05/2017 MV: no ischemia, EF 79%.   Chest pain 10/20/2017   Chronic diastolic CHF (congestive heart failure) (HCC)    a. 01/2011 Echo: EF 50-55%, gr1 DD, mild AI, nl RV fxn, mild TR/PR; b. 10/2017 Echo: EF 60-65%, mild LVH, gr2 DD.   DDD (degenerative disc disease), cervical    Depressive disorder, not elsewhere classified    Difficult intubation    Dysphagia, oral phase    Dyspnea    Edema    Gallstones    a. Symptomatic - s/p lap chole 05/2018.   History of DVT (deep vein thrombosis)    History of kidney stones    Hypertension    Hypoxemia    Impacted cerumen    Ischemic colitis (HCC)    a. 02/2018 GIB - colonoscopy w/ isch  colitis. Anticoagulation resumed.   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 a. 01/2011 in setting of post-op complications including aspiration pna;  b. CHA2DS2VASc = 4--> Amio/Eliquis .   Pneumonia 03/06/2003   Spinal stenosis, unspecified region other than cervical    Squamous cell carcinoma of skin of trunk, except scrotum    skin cancer of shoulder   Syncope    a. 10/2017-->Event monitor: RSR, rare PACs/PVCs.   Thoracic or lumbosacral neuritis or radiculitis, unspecified     Past Surgical History:  Procedure Laterality Date   CARDIOVERSION N/A 06/20/2017   Procedure: CARDIOVERSION;  Surgeon: Devorah Fonder, MD;  Location: [facility] ORS;  Service: Cardiovascular;  Laterality: N/A;   CATARACT EXTRACTION W/ INTRAOCULAR LENS  IMPLANT, BILATERAL     CERVICAL FUSION  02/10/2011   CHOLECYSTECTOMY  05/24/2018   CHOLECYSTECTOMY N/A 05/24/2018   Procedure: LAPAROSCOPIC CHOLECYSTECTOMY;  Surgeon: Oza Blumenthal, MD;  Location: [facility] OR;  Service: General;  Laterality:  N/A;   COLONOSCOPY WITH PROPOFOL  N/A 02/20/2018   Procedure: COLONOSCOPY WITH PROPOFOL ;  Surgeon: Marnee Sink, MD;  Location: [facility] ENDOSCOPY;  Service: Endoscopy;  Laterality: N/A;   history of abd ultrasound  11/01   fatty liver   MULTIPLE TOOTH EXTRACTIONS     ORIF FIBULA FRACTURE Left 01/06/2017   Procedure: OPEN REDUCTION INTERNAL FIXATION (ORIF) FIBULA FRACTURE DISTAL FIBULA;  Surgeon: Dayne Even, MD;  Location: [facility] OR;  Service: Orthopedics;  Laterality: Left;  Patient states has problems if he will have a tube in throat for Genera; Anesthesia     reports that he has quit smoking. His smoking use included cigarettes. He has never used smokeless  tobacco. He reports that he does not drink alcohol and does not use drugs.  Allergies  Allergen Reactions   Morphine And Codeine Shortness Of Breath   Percocet [Oxycodone-Acetaminophen ] Shortness Of Breath   Valium Shortness Of Breath    Family History  Problem Relation Age of Onset   Stroke Father    Atrial fibrillation Brother        on coumadin   Heart disease Brother        AFib- coumadin   Stroke Paternal Grandfather    Other Mother        hemorrhage   Cancer Other        colon cancer at early age   Prostate cancer Neg Hx    Kidney cancer Neg Hx    Bladder Cancer Neg Hx     Prior to Admission medications   Medication Sig Start Date End Date Taking? Authorizing Provider  albuterol  (PROVENTIL ) (2.5 MG/3ML) 0.083% nebulizer solution TAKE 3MLS(1 VIAL) VIA NEBULIZER EVERY 6 HOURS AS NEEDED Patient taking differently: Take 2.5 mg by nebulization every 6 (six) hours as needed for wheezing or shortness of breath. 10/16/23  Yes Verma Gobble, NP  cetirizine  (ZYRTEC ) 10 MG tablet TAKE 1 TABLET(10 MG) BY MOUTH DAILY 03/22/24  Yes Verma Gobble, NP  Cholecalciferol  (VITAMIN D3) 1000 units CAPS Take 1,000 Units by mouth daily.   Yes [provider]  doxycycline  (VIBRA -TABS) 100 MG tablet Take 1 tablet (100 mg total) by mouth 2 (two) times daily for 14 days. 04/03/24 04/17/24 Yes Medina-Vargas, Monina C, NP  ELIQUIS  5 MG TABS tablet TAKE 1 TABLET BY MOUTH TWICE DAILY 04/24/23  Yes Eubanks, Jessica K, NP  Fluticasone -Umeclidin-Vilant (TRELEGY ELLIPTA ) 100-62.5-25 MCG/ACT AEPB Inhale 1 puff into the lungs daily. 01/10/24  Yes Assaker, Marianne Shirts, MD  furosemide  (LASIX ) 40 MG tablet Take 1 tablet (40 mg total) by mouth daily. 09/20/23 04/08/24 Yes Aisha Hove, MD  gabapentin  (NEURONTIN ) 300 MG capsule TAKE 1 CAPSULE(300 MG) BY MOUTH TWICE DAILY 03/25/24  Yes Eubanks, Jessica K, NP  guaiFENesin -dextromethorphan  (ROBITUSSIN DM) 100-10 MG/5ML syrup Take 10 mLs by mouth every  4 (four) hours as needed for cough. 09/20/23  Yes Aisha Hove, MD  levothyroxine  (SYNTHROID ) 25 MCG tablet TAKE 1 TABLET BY MOUTH EVERY DAY BEFORE BREAKFAST AS DIRECTED 03/22/24  Yes Verma Gobble, NP  OXYGEN  Inhale 4 L/min into the lungs at bedtime.   Yes [provider]  pravastatin  (PRAVACHOL ) 40 MG tablet TAKE 1 TABLET BY MOUTH EVERY DAY Patient taking differently: Take 40 mg by mouth at bedtime. 04/03/24  Yes Verma Gobble, NP  Cholecalciferol  (VITAMIN D ) 1000 UNITS capsule Take one tablet once daily Patient not taking: Reported on 04/08/2024 08/09/13   Anthoney Kipper L, DO  fluticasone  (FLONASE ) 50 MCG/ACT nasal spray Place  1 spray into both nostrils daily as needed for allergies or rhinitis. Patient not taking: Reported on 04/08/2024 10/17/22   Sood, Vineet, MD  Fluticasone -Umeclidin-Vilant (TRELEGY ELLIPTA ) 100-62.5-25 MCG/ACT AEPB Inhale 1 puff into the lungs daily. Patient not taking: Reported on 04/08/2024 01/10/24   Annitta Kindler, MD  furosemide  (LASIX ) 40 MG tablet Take 1 tablet (40 mg total) by mouth daily for 3 days. Take in the afternoon Patient not taking: Reported on 04/08/2024 04/03/24 04/08/24  Medina-Vargas, Monina C, NP  metoprolol  tartrate (LOPRESSOR ) 25 MG tablet TAKE 1/2 TABLET(12.5 MG) BY MOUTH TWICE DAILY Patient not taking: Reported on 04/08/2024 12/01/23   Eubanks, Jessica K, NP  potassium chloride  SA (KLOR-CON  M) 20 MEQ tablet Take 1 tablet (20 mEq total) by mouth daily for 3 days. Patient not taking: Reported on 04/08/2024 04/03/24 04/06/24  Medina-Vargas, Monina C, NP  saccharomyces boulardii (FLORASTOR) 250 MG capsule Take 1 capsule (250 mg total) by mouth 2 (two) times daily for 17 days. Patient not taking: Reported on 04/08/2024 04/03/24 04/20/24  Medina-Vargas, Sid Dragon, NP    Physical Exam: Constitutional: Moderately built and nourished. Vitals:   04/08/24 2125 04/09/24 0100 04/09/24 0200 04/09/24 0300  BP: (!) 156/68 130/66 (!) 145/86 (!)  141/59  Pulse: 63 (!) 37 (!) 49 (!) 52  Resp: 17 15 17  (!) 24  Temp: 97.7 F (36.5 C)     TempSrc: Oral     SpO2: 96% 95% 98% 100%  Weight:      Height:       Eyes: Anicteric no pallor. ENMT: No discharge from the ears eyes nose and mouth. Neck: No mass felt.  No neck rigidity. Respiratory: Mild expiratory wheezes are no crepitations. Cardiovascular: S1-S2 heard. Abdomen: Soft nontender bowel sound present. Musculoskeletal: Bilateral lower extremity edema present. Skin: No rash. Neurologic: Alert awake oriented to time place and person.  Moves all extremities. Psychiatric: Appears normal.  Normal affect.   Labs on Admission: I have personally reviewed following labs and imaging studies  CBC: Recent Labs  Lab 04/03/24 1521 04/08/24 1749  WBC 6.7 6.5  NEUTROABS 4,040 3.7  HGB 12.9* 14.4  HCT 40.9 46.4  MCV 93.4 96.7  PLT 155 209   Basic Metabolic Panel: Recent Labs  Lab 04/03/24 1521 04/08/24 2042  NA 143 141  K 4.2 4.1  CL 108 106  CO2 28 28  GLUCOSE 90 88  BUN 30* 26*  CREATININE 1.25* 1.12  CALCIUM 9.2 8.7*   GFR: Estimated Creatinine Clearance: 46.1 mL/min (by C-G formula based on SCr of 1.12 mg/dL). Liver Function Tests: No results for input(s): "AST", "ALT", "ALKPHOS", "BILITOT", "PROT", "ALBUMIN" in the last 168 hours. No results for input(s): "LIPASE", "AMYLASE" in the last 168 hours. No results for input(s): "AMMONIA" in the last 168 hours. Coagulation Profile: No results for input(s): "INR", "PROTIME" in the last 168 hours. Cardiac Enzymes: No results for input(s): "CKTOTAL", "CKMB", "CKMBINDEX", "TROPONINI" in the last 168 hours. BNP (last 3 results) No results for input(s): "PROBNP" in the last 8760 hours. HbA1C: No results for input(s): "HGBA1C" in the last 72 hours. CBG: No results for input(s): "GLUCAP" in the last 168 hours. Lipid Profile: No results for input(s): "CHOL", "HDL", "LDLCALC", "TRIG", "CHOLHDL", "LDLDIRECT" in the last 72  hours. Thyroid  Function Tests: No results for input(s): "TSH", "T4TOTAL", "FREET4", "T3FREE", "THYROIDAB" in the last 72 hours. Anemia Panel: No results for input(s): "VITAMINB12", "FOLATE", "FERRITIN", "TIBC", "IRON", "RETICCTPCT" in the last 72 hours. Urine analysis:  Component Value Date/Time   COLORURINE YELLOW 02/28/2019 1652   APPEARANCEUR CLEAR 02/28/2019 1652   APPEARANCEUR Clear 07/27/2017 0954   LABSPEC 1.020 02/28/2019 1652   PHURINE 5.0 02/28/2019 1652   GLUCOSEU NEGATIVE 02/28/2019 1652   HGBUR NEGATIVE 02/28/2019 1652   BILIRUBINUR Orange 11/02/2020 1241   BILIRUBINUR Negative 07/27/2017 0954   KETONESUR NEGATIVE 02/28/2019 1652   PROTEINUR Positive (A) 11/02/2020 1241   PROTEINUR NEGATIVE 02/28/2019 1652   UROBILINOGEN 0.2 11/02/2020 1241   UROBILINOGEN 0.2 02/02/2011 1025   NITRITE Negative 11/02/2020 1241   NITRITE NEGATIVE 02/28/2019 1652   LEUKOCYTESUR Negative 11/02/2020 1241   LEUKOCYTESUR NEGATIVE 02/28/2019 1652   Sepsis Labs: @LABRCNTIP (procalcitonin:4,lacticidven:4) )No results found for this or any previous visit (from the past 240 hours).   Radiological Exams on Admission: CT Angio Chest PE W and/or Wo Contrast Result Date: 04/08/2024 EXAM: CTA of the Chest without and with IV contrast for PE 04/08/2024 11:21:27 PM TECHNIQUE: CTA of the chest was performed after the administration of intravenous contrast. Multiplanar reformatted images are provided for review. MIP images are provided for review. Automated exposure control, iterative reconstruction, and/or weight based adjustment of the mA/kV was utilized to reduce the radiation dose to as low as reasonably achievable. COMPARISON: Chest radiograph earlier today and CT chest dated 09/15/2023. CLINICAL HISTORY: Pulmonary embolism (PE) suspected, high probability. Patient went to heart MD and was noted to be hypoxic and bradycardic. Patient reports increased shortness of breath and bradycardia with PVC.  Patient has bilateral leg swelling that is weeping. Contrast administered: 75mL (iohexol  (OMNIPAQUE ) 350 MG/ML injection 75 mL IOHEXOL  350 MG/ML SOLN). FINDINGS: PULMONARY ARTERIES: Pulmonary arteries are adequately opacified for evaluation. No evidence of pulmonary embolism. Main pulmonary artery is normal in caliber. MEDIASTINUM: No evidence of mediastinal lymphadenopathy. There is no acute abnormality of the thoracic aorta. Cardiomegaly. Atherosclerotic calcification of the aortic arch. Moderate coronary arthrosis of the LAD. LYMPH NODES: No evidence of mediastinal, hilar or axillary lymphadenopathy. LUNGS AND PLEURA: Evaluation of the lung parenchyma is constrained by respiratory motion. Scattered dependent patchy opacities, favoring atelectasis. Small right and trace left pleural effusions. UPPER ABDOMEN: Calcified splenic granulomata. Status post cholecystectomy. SOFT TISSUES AND BONES: No acute bone or soft tissue abnormality. IMPRESSION: 1. No evidence of pulmonary embolism. 2. Cardiomegaly. Small right and trace left pleural effusions. 3. Scattered dependent patchy opacities, favoring atelectasis. Electronically signed by: Zadie Herter MD 04/08/2024 11:25 PM EDT RP Workstation: [id number]   DG Chest Port 1 View Result Date: 04/08/2024 CLINICAL DATA:  Shortness of breath. EXAM: PORTABLE CHEST 1 VIEW COMPARISON:  Chest radiograph dated 12/15/2023. FINDINGS: No focal consolidation, pleural effusion, pneumothorax. Mild cardiomegaly. Degenerative changes of the spine. No acute osseous pathology. IMPRESSION: 1. No active disease. 2. Mild cardiomegaly. Electronically Signed   By: Angus Bark M.D.   On: 04/08/2024 18:55    EKG: Independently reviewed.  A-fib slow rate.  Assessment/Plan Principal Problem:   Acute CHF (congestive heart failure) (HCC) Active Problems:   PAF (paroxysmal atrial fibrillation) (HCC)   Hypertension   Mixed hyperlipidemia   Acute on chronic diastolic CHF (congestive  heart failure) (HCC)   CKD (chronic kidney disease), stage III (HCC)   COPD with acute exacerbation (HCC)   GERD (gastroesophageal reflux disease)   Hypothyroidism   Acute CHF (HCC)    Acute on chronic diastolic CHF last EF measured was 60 to 65% in October 2024 with RV dilatation -   appreciate cardiology consult patient is placed on 80 mg IV  Lasix .  I have ordered 2 doses further doses to be decided based on response.  Closely follow intake output metabolic panel daily weights.  Recheck 2D echo. COPD exacerbation on scheduled and as needed nebulizer IV steroids and antibiotics.  On 4 L oxygen  which is at baseline. Paroxysmal atrial fibrillation presently in slow A-fib.  Patient's beta-blockers was recently discontinued.  Not on any rate limiting medication at this time.  On Eliquis  for anticoagulation.  Check TSH. Hypothyroidism on Synthroid . Hyperlipidemia on statins. GERD on PPI. Neuropathy on gabapentin .  Since patient has CHF exacerbation with possible COPD exacerbation will need further workup further management and more than 2 midnight stay.   DVT prophylaxis: Eliquis . Code Status: DNR. Family Communication: Discussed with patient. Disposition Plan: Monitored bed. Consults called: Cardiology. Admission status: Inpatient.

## 2024-04-09 NOTE — Evaluation (Signed)
 Physical Therapy Evaluation Patient Details Name: Eric [name] MRN: [id number] DOB: [birth year] Today's Date: 04/09/2024  History of Present Illness  Patient is an 89 y/o male admitted 04/08/24 with SOB found to have bilateral pleural effusion with wheeze and elevated BNP.  Admitted for acute CHF with COPD exacerbation.  PMH positive for CHR, COPD, arthritis, cervical DDD, dysphagia, h/o DVT, HTN, HLD, ischemic colitis, PAF, SCC of skin, PNA and L foot fracture.  Clinical Impression  Patient presents with decreased mobility due to SOB, generalized weakness and decreased activity tolerance.  Previously independent with mobility with RW and with BADL's with shower seat.  He was able to walk into the hallway today with RW and CGA.  Initially not on O2 then noted drop to 87% so applied @ 2LPM for SpO2 89%.  Patient appropriate for skilled PT In the acute setting and for follow up HHPT at d/c.         If plan is discharge home, recommend the following: A little help with walking and/or transfers;A little help with bathing/dressing/bathroom;Assist for transportation;Help with stairs or ramp for entrance   Can travel by private vehicle        Equipment Recommendations None recommended by PT  Recommendations for Other Services       Functional Status Assessment Patient has had a recent decline in their functional status and demonstrates the ability to make significant improvements in function in a reasonable and predictable amount of time.     Precautions / Restrictions Precautions Precautions: Fall      Mobility  Bed Mobility Overal bed mobility: Needs Assistance Bed Mobility: Supine to Sit     Supine to sit: Min assist, HOB elevated     General bed mobility comments: sleeps in recliner at home, assist to lift trunk    Transfers Overall transfer level: Needs assistance Equipment used: Rolling walker (2 wheels) Transfers: Sit to/from Stand Sit to Stand: Contact guard assist, From  elevated surface           General transfer comment: from  high bed    Ambulation/Gait Ambulation/Gait assistance: Contact guard assist Gait Distance (Feet): 50 Feet Assistive device: Rolling walker (2 wheels) Gait Pattern/deviations: Step-through pattern, Step-to pattern, Decreased stride length, Trunk flexed, Decreased dorsiflexion - right, Decreased dorsiflexion - left, Shuffle       General Gait Details: assist for balance and safety with RW; on RA though dropped to 87% so applied 2L O2  Stairs            Wheelchair Mobility     Tilt Bed    Modified Rankin (Stroke Patients Only)       Balance Overall balance assessment: Needs assistance   Sitting balance-Leahy Scale: Fair Sitting balance - Comments: left seated with tray for lunch, son in the room and RN aware   Standing balance support: Bilateral upper extremity supported Standing balance-Leahy Scale: Poor                               Pertinent Vitals/Pain Pain Assessment Pain Assessment: 0-10 Pain Score: 6  Pain Location: R leg ulcer Pain Descriptors / Indicators: Sore, Tender Pain Intervention(s): Monitored during session    Home Living Family/patient expects to be discharged to:: Private residence Living Arrangements: Spouse/significant other Available Help at Discharge: Family;Available 24 hours/day Type of Home: House Home Access: Ramped entrance     Alternate Level Stairs-Number of Steps: main level + basement  Home Layout: Multi-level;Able to live on main level with bedroom/bathroom Home Equipment: Rolling Walker (2 wheels);Rollator (4 wheels);Lift chair;Shower seat Additional Comments: sleeps in lift chair    Prior Function               Mobility Comments: denies falls in past 6 months, had HHPT in the past [facility] ADLs Comments: Reports he bathes & dresses without assistance, assisted for IADLs.     Extremity/Trunk Assessment   Upper Extremity  Assessment Upper Extremity Assessment: RUE deficits/detail;LUE deficits/detail RUE Deficits / Details: significant shoulder elevation limitations from arthritis and noted trigger finger on R index and pt reports numbness in both hands RUE Sensation: decreased light touch LUE Deficits / Details: significant shoulder elevation limitations from arthritis and noted trigger finger on R index and pt reports numbness in both hands LUE Coordination: decreased fine motor    Lower Extremity Assessment Lower Extremity Assessment: RLE deficits/detail;LLE deficits/detail RLE Deficits / Details: numbness up above knees; ROM WFL, strength grossly 4/5 throughout; wound on anterior shin R LE RLE Sensation: history of peripheral neuropathy;decreased light touch LLE Deficits / Details: numbness up above knees; ROM WFL, strength grossly 4/5 throughout LLE Sensation: history of peripheral neuropathy;decreased light touch    Cervical / Trunk Assessment Cervical / Trunk Assessment: Kyphotic  Communication   Communication Factors Affecting Communication: Hearing impaired    Cognition Arousal: Alert Behavior During Therapy: WFL for tasks assessed/performed   PT - Cognitive impairments: No apparent impairments                         Following commands: Intact       Cueing       General Comments      Exercises     Assessment/Plan    PT Assessment Patient needs continued PT services  PT Problem List Decreased activity tolerance;Decreased balance;Cardiopulmonary status limiting activity;Decreased strength;Decreased mobility       PT Treatment Interventions DME instruction;Therapeutic exercise;Gait training;Balance training;Stair training;Functional mobility training;Therapeutic activities;Patient/family education    PT Goals (Current goals can be found in the Care Plan section)  Acute Rehab PT Goals Patient Stated Goal: return to independent, help breathing PT Goal Formulation:  With patient/family Time For Goal Achievement: 04/23/24 Potential to Achieve Goals: Good    Frequency Min 2X/week     Co-evaluation               AM-PAC PT "6 Clicks" Mobility  Outcome Measure Help needed turning from your back to your side while in a flat bed without using bedrails?: A Little Help needed moving from lying on your back to sitting on the side of a flat bed without using bedrails?: A Little Help needed moving to and from a bed to a chair (including a wheelchair)?: A Little Help needed standing up from a chair using your arms (e.g., wheelchair or bedside chair)?: A Little Help needed to walk in hospital room?: A Little Help needed climbing 3-5 steps with a railing? : Total 6 Click Score: 16    End of Session Equipment Utilized During Treatment: Gait belt Activity Tolerance: Patient limited by fatigue Patient left: in bed;with call bell/phone within reach;with family/visitor present Nurse Communication: Mobility status PT Visit Diagnosis: Other abnormalities of gait and mobility (R26.89);Muscle weakness (generalized) (M62.81)    Time: 1610-9604 PT Time Calculation (min) (ACUTE ONLY): 25 min   Charges:   PT Evaluation $PT Eval Moderate Complexity: 1 Mod PT Treatments $Gait Training: 8-22 mins PT  General Charges $$ ACUTE PT VISIT: 1 Visit         Abigail Hoff, PT Acute Rehabilitation Services [phone] 04/09/2024   Marley Simmers 04/09/2024, 4:23 PM

## 2024-04-09 NOTE — Progress Notes (Signed)
 Cardiologist: Wende Halo Subjective:   Dyspnea not at baseline Sees lung doctor in [place]  Objective:  Vitals:   04/09/24 0500 04/09/24 0530 04/09/24 0550 04/09/24 0700  BP: (!) 144/71 (!) 161/85  (!) 150/93  Pulse: (!) 51 (!) 40  (!) 33  Resp:      Temp:   97.6 F (36.4 C)   TempSrc:   Oral   SpO2: 99% 93%  100%  Weight:      Height:        Intake/Output from previous day:  Intake/Output Summary (Last 24 hours) at 04/09/2024 0836 Last data filed at 04/09/2024 0620 Gross per 24 hour  Intake --  Output 875 ml  Net -875 ml    Physical Exam: Elderly male COPD no crackles No murmur Abdomen benign LE edema worse on right plus 2 with chronic venous stasis   Lab Results: Basic Metabolic Panel: Recent Labs    04/08/24 2042 04/09/24 0508  NA 141 140  K 4.1 3.8  CL 106 102  CO2 28 30  GLUCOSE 88 98  BUN 26* 24*  CREATININE 1.12 1.24  CALCIUM 8.7* 8.9   Liver Function Tests: Recent Labs    04/09/24 0508  AST 41  ALT 34  ALKPHOS 65  BILITOT 2.3*  PROT 6.9  ALBUMIN 3.6   No results for input(s): "LIPASE", "AMYLASE" in the last 72 hours. CBC: Recent Labs    04/08/24 1749 04/09/24 0508  WBC 6.5 5.8  NEUTROABS 3.7 3.5  HGB 14.4 13.8  HCT 46.4 44.4  MCV 96.7 95.7  PLT 209 152    Thyroid  Function Tests: Recent Labs    04/09/24 0508  TSH 2.539   Anemia Panel: No results for input(s): "VITAMINB12", "FOLATE", "FERRITIN", "TIBC", "IRON", "RETICCTPCT" in the last 72 hours.  Imaging: CT Angio Chest PE W and/or Wo Contrast Result Date: 04/08/2024 EXAM: CTA of the Chest without and with IV contrast for PE 04/08/2024 11:21:27 PM TECHNIQUE: CTA of the chest was performed after the administration of intravenous contrast. Multiplanar reformatted images are provided for review. MIP images are provided for review. Automated exposure control, iterative reconstruction, and/or weight based adjustment of the mA/kV was utilized to reduce the radiation  dose to as low as reasonably achievable. COMPARISON: Chest radiograph earlier today and CT chest dated 09/15/2023. CLINICAL HISTORY: Pulmonary embolism (PE) suspected, high probability. Patient went to heart MD and was noted to be hypoxic and bradycardic. Patient reports increased shortness of breath and bradycardia with PVC. Patient has bilateral leg swelling that is weeping. Contrast administered: 75mL (iohexol  (OMNIPAQUE ) 350 MG/ML injection 75 mL IOHEXOL  350 MG/ML SOLN). FINDINGS: PULMONARY ARTERIES: Pulmonary arteries are adequately opacified for evaluation. No evidence of pulmonary embolism. Main pulmonary artery is normal in caliber. MEDIASTINUM: No evidence of mediastinal lymphadenopathy. There is no acute abnormality of the thoracic aorta. Cardiomegaly. Atherosclerotic calcification of the aortic arch. Moderate coronary arthrosis of the LAD. LYMPH NODES: No evidence of mediastinal, hilar or axillary lymphadenopathy. LUNGS AND PLEURA: Evaluation of the lung parenchyma is constrained by respiratory motion. Scattered dependent patchy opacities, favoring atelectasis. Small right and trace left pleural effusions. UPPER ABDOMEN: Calcified splenic granulomata. Status post cholecystectomy. SOFT TISSUES AND BONES: No acute bone or soft tissue abnormality. IMPRESSION: 1. No evidence of pulmonary embolism. 2. Cardiomegaly. Small right and trace left pleural effusions. 3. Scattered dependent patchy opacities, favoring atelectasis. Electronically signed by: Zadie Herter MD 04/08/2024 11:25 PM EDT RP Workstation: [id number]   DG Chest Port 1 [address]  Result Date: 04/08/2024 CLINICAL DATA:  Shortness of breath. EXAM: PORTABLE CHEST 1 VIEW COMPARISON:  Chest radiograph dated 12/15/2023. FINDINGS: No focal consolidation, pleural effusion, pneumothorax. Mild cardiomegaly. Degenerative changes of the spine. No acute osseous pathology. IMPRESSION: 1. No active disease. 2. Mild cardiomegaly. Electronically Signed   By: Angus Bark M.D.   On: 04/08/2024 18:55    Cardiac Studies:  ECG: afib rate 51 PVC nonspecific ST changes    Telemetry: afib  Echo: 09/15/23 EF 60-65% moderate RV dysfunction mild MR severe bi atrial enlargement  Medications:    apixaban   5 mg Oral BID   furosemide   80 mg Intravenous Q12H   gabapentin   300 mg Oral BID   ipratropium-albuterol   3 mL Nebulization Q4H   levothyroxine   25 mcg Oral Q0600   methylPREDNISolone  (SOLU-MEDROL ) injection  40 mg Intravenous Q12H   potassium chloride  SA  20 mEq Oral Daily   pravastatin   40 mg Oral QHS      doxycycline  (VIBRAMYCIN ) IV Stopped (04/09/24 0827)    Assessment/Plan:   CHF:  seems to be more of a COPD exacerbation with chronic LE venous insufficiency. CXR with no CHF BNP mildly elevated 333 agree with iv lasix  for now.  COPD: on home oxygen  last 4 years or so prior smoker some RV failure contributes to LE edema on doxy and prednisone  and nebs  Thyroid :  continue synthroid  replacement TSH normal 2.5 Afib:  chronic with severe bi atrial enlargement slow rate no AV nodal drugs needed continue eliquis    Eric [name] 04/09/2024, 8:36 AM

## 2024-04-10 DIAGNOSIS — N1832 Chronic kidney disease, stage 3b: Secondary | ICD-10-CM | POA: Diagnosis not present

## 2024-04-10 DIAGNOSIS — I10 Essential (primary) hypertension: Secondary | ICD-10-CM

## 2024-04-10 DIAGNOSIS — I5031 Acute diastolic (congestive) heart failure: Secondary | ICD-10-CM | POA: Diagnosis not present

## 2024-04-10 DIAGNOSIS — I480 Paroxysmal atrial fibrillation: Secondary | ICD-10-CM | POA: Diagnosis not present

## 2024-04-10 DIAGNOSIS — E440 Moderate protein-calorie malnutrition: Secondary | ICD-10-CM

## 2024-04-10 DIAGNOSIS — J441 Chronic obstructive pulmonary disease with (acute) exacerbation: Secondary | ICD-10-CM | POA: Diagnosis not present

## 2024-04-10 LAB — BASIC METABOLIC PANEL WITH GFR
Anion gap: 12 (ref 5–15)
BUN: 28 mg/dL — ABNORMAL HIGH (ref 8–23)
CO2: 29 mmol/L (ref 22–32)
Calcium: 9 mg/dL (ref 8.9–10.3)
Chloride: 98 mmol/L (ref 98–111)
Creatinine, Ser: 1.43 mg/dL — ABNORMAL HIGH (ref 0.61–1.24)
GFR, Estimated: 47 mL/min — ABNORMAL LOW (ref >60)
Glucose, Bld: 133 mg/dL — ABNORMAL HIGH (ref 70–99)
Potassium: 4 mmol/L (ref 3.5–5.1)
Sodium: 139 mmol/L (ref 135–145)

## 2024-04-10 NOTE — Assessment & Plan Note (Signed)
 Continue with levothyroxine

## 2024-04-10 NOTE — Assessment & Plan Note (Addendum)
 AKI  Renal function at the time of discharge with improving serum cr to 1,27 with K at 3,8 and serum bicarbonate at 26  Na 138   Plan to continue diuresis with furosemide  40 mg po daily and follow up renal function as outpatient.

## 2024-04-10 NOTE — Assessment & Plan Note (Addendum)
 Continue anticoagulation with apixaban .  Telemetry with atrial flutter and atrial fibrillation, positive bigeminy.  Patient not on AV blocking agents due to bradycardia.

## 2024-04-10 NOTE — Assessment & Plan Note (Signed)
 Continue statin therapy.

## 2024-04-10 NOTE — Assessment & Plan Note (Signed)
 Continue pantoprazole.

## 2024-04-10 NOTE — Assessment & Plan Note (Addendum)
 Echocardiogram with preserved LV systolic function with EF 55 to 60%, mild LVH, RV systolic function preserved, severe dilatation of LA and RA, mild to moderate mitral regurgitation, no aortic stenosis.   Patient was placed on furosemide  for diuresis, negative fluid balance was achieved, -2,531 ml, with significant improvement in his symptoms.   Plan to continue diuresis with furosemide  and added SLGT 2 inh.   RAAS inhibition with spironolactone   Limited guideline directed medical therapy due to acutely reduced GFR.

## 2024-04-10 NOTE — Assessment & Plan Note (Signed)
 Continue with nutritional supplements.

## 2024-04-10 NOTE — Progress Notes (Signed)
 Heart Failure Navigator Progress Note  Assessed for Heart & Vascular TOC clinic readiness.  Patient does not meet criteria due to EF 55-60%, per Cardiology admission mostly from COPD flair. No HF TOC. .   Navigator  will sign off at this time.   Randie Bustle, BSN, Scientist, clinical (histocompatibility and immunogenetics) Only

## 2024-04-10 NOTE — Consult Note (Signed)
 WOC Nurse Consult Note: Reason for Consult:Right anterior lower leg wound in the setting of COPD exacerbation and lower extremity edema.  This wound is the result of a large ruptured bulla.  Chronic skin changes to bilateral lower legs.  Left third toe with callous to plantar aspect.  Wound type:inflammatory Pressure Injury POA: NA Measurement: 7 cm x 5 cm x 0.1 cm  LEft plantar toe:  0.3 cm raised callous Wound bed:red and moist to right leg.  Drainage (amount, consistency, odor) moderate serosanguinous to right leg.  No odor  left toe is dry  Periwound: edema, chronic skin changes to lower legs  Does not wear compression at home.  Does not want short term compression at this time to manage edema.  Dressing procedure/placement/frequency: Cleanse wound to right anterior lower leg with NS and pat dry. Apply Xeroform gauze to wound bed. COver with dry gauze and tape. Change daily.  Will not follow at this time.  Please re-consult if needed.  Branda Cain MSN, RN, FNP-BC CWON Wound, Ostomy, Continence Nurse Outpatient [facility] [phone] Pager [phone]

## 2024-04-10 NOTE — Assessment & Plan Note (Addendum)
 No signs of acute exacerbation  Continue bronchodilator therapy  Continue home 02 at night  Home health PT

## 2024-04-10 NOTE — Assessment & Plan Note (Addendum)
 Continue blood pressure monitoring.  Currently off antihypertensive medications.

## 2024-04-10 NOTE — Progress Notes (Addendum)
  Progress Note   Patient: Eric [name] ZOX:[id number] DOB: June 09, 1932 DOA: 04/08/2024     1 DOS: the patient was seen and examined on 04/10/2024   Brief hospital course: Eric [name] was admitted to the hospital with the working diagnosis of acute on chronic diastolic heart failure.   89/M w  COPD, diastolic CHF, RV failure, hypothyroidism, hyperlipidemia, neuropathy has been experiencing dyspnea for the last 2 weeks at rest and exertion.  Also noticed increasing lower extremity edema.   Recently patient has been bradycardic and his beta-blockers were discontinued.  Also has intermittent cough and wheezing  Chest radiograph with hypoinflation, cardiomegaly with bilateral hilar vascular congestion, fluid in the right fissure.   CT chest with bilateral ground glass opacities with small right pleural effusion.  Negative for pulmonary embolism.     Assessment and Plan: * Acute on chronic diastolic CHF (congestive heart failure) (HCC) Echocardiogram with preserved LV systolic function with EF 55 to 60%, mild LVH, RV systolic function preserved, severe dilatation of LA and RA, mild to moderate mitral regurgitation, no aortic stenosis.   Urine output is 2,750 ml Systolic blood pressure 110 mmHg range  Patient had 2 doses of furosemide  80 mg IV yesterday  Plan to resume oral furosemide  tomorrow morning   PAF (paroxysmal atrial fibrillation) (HCC) Continue anticoagulation with apixaban  Patient not on AV blocking agents.  CKD (chronic kidney disease), stage III (HCC) Renal function with serum cr at 1.43 with K at 4,0 and serum bicarbonate at 29  Na 139   Plan to follow up renal function in am   COPD with acute exacerbation (HCC) No signs of acute exacerbation  Continue bronchodilator therapy  Out of bed to chair   GERD (gastroesophageal reflux disease) Continue pantoprazole    Hypertension Continue blood pressure monitoring   Hypothyroidism Continue with levothyroxine    Mixed  hyperlipidemia Continue statin therapy   Malnutrition of moderate degree Continue with nutritional supplements.         Subjective: Patient is feeling better, dyspnea and edema continue to improve  Physical Exam: Vitals:   04/10/24 0446 04/10/24 0723 04/10/24 0746 04/10/24 0747  BP: (!) 109/54 (!) 120/49    Pulse: 60 (!) 53  (!) 35  Resp: 20 16  (!) 21  Temp: 97.6 F (36.4 C) (!) 96.9 F (36.1 C)    TempSrc: Oral Axillary    SpO2: 96% 91% 99% 95%  Weight: 82.5 kg     Height:       Neurology awake and alert ENT with mild pallor with no icterus Cardiovascular with S1 and S2 present, irregularly irregular with positive systolic murmur at the apex, no gallops or rubs Respiratory with mild rales at bases, prolonged expiratory phase and scattered rhonchi with no rales Abdomen with no distention  Lower extremity edema trace  Data Reviewed:    Family Communication: no family at the bedside   Disposition: Status is: Inpatient Remains inpatient appropriate because: Iv diuresis   Planned Discharge Destination: Home      Author: Albertus Alt, MD 04/10/2024 7:49 AM  For on call review [email or web address].

## 2024-04-10 NOTE — Evaluation (Addendum)
 Occupational Therapy Evaluation Patient Details Name: Eric [name] MRN: [id number] DOB: 04-Apr-1932 Today's Date: 04/10/2024   History of Present Illness   Patient is an 89 y/o male admitted 04/08/24 with SOB found to have bilateral pleural effusion with wheeze and elevated BNP.  Admitted for acute CHF with COPD exacerbation.  PMH positive for CHR, COPD, arthritis, cervical DDD, dysphagia, h/o DVT, HTN, HLD, ischemic colitis, PAF, SCC of skin, PNA and L foot fracture.     Clinical Impressions Pt lives with his wife and walks with a RW. He reports sitting to shower and being independent in self care. He is assisted for IADLs. Pt reports he uses O2 only at night. Pt presents with longstanding shoulder ROM limitations, sensory deficits in hands, decreased safety awareness and impaired balance. He was able to complete bed mobility mod I, despite sleeping in his lift chair at home. Pt requires CGA with RW to ambulate and up to min assist for ADLs. Will follow acutely. Recommending HHOT.      If plan is discharge home, recommend the following:   A little help with walking and/or transfers;A little help with bathing/dressing/bathroom;Assistance with cooking/housework;Assist for transportation;Help with stairs or ramp for entrance     Functional Status Assessment   Patient has had a recent decline in their functional status and demonstrates the ability to make significant improvements in function in a reasonable and predictable amount of time.     Equipment Recommendations   None recommended by OT     Recommendations for Other Services         Precautions/Restrictions   Precautions Precautions: Fall     Mobility Bed Mobility Overal bed mobility: Modified Independent             General bed mobility comments: use of rail, HOB nearly flat    Transfers Overall transfer level: Needs assistance Equipment used: Rolling walker (2 wheels) Transfers: Sit to/from Stand Sit to  Stand: Contact guard assist, From elevated surface           General transfer comment: from elevated bed      Balance Overall balance assessment: Needs assistance   Sitting balance-Leahy Scale: Fair     Standing balance support: Bilateral upper extremity supported Standing balance-Leahy Scale: Poor                             ADL either performed or assessed with clinical judgement   ADL Overall ADL's : Needs assistance/impaired Eating/Feeding: Set up;Sitting   Grooming: Standing;Wash/dry hands;Contact guard assist   Upper Body Bathing: Sitting;Supervision/ safety   Lower Body Bathing: Contact guard assist;Sit to/from stand   Upper Body Dressing : Set up;Sitting   Lower Body Dressing: Contact guard assist;Sit to/from stand   Toilet Transfer: Contact guard assist;Ambulation;Rolling walker (2 wheels)           Functional mobility during ADLs: Contact guard assist;Rolling walker (2 wheels)       Vision Ability to See in Adequate Light: 0 Adequate Patient Visual Report: No change from baseline       Perception         Praxis         Pertinent Vitals/Pain Pain Assessment Pain Assessment: No/denies pain     Extremity/Trunk Assessment Upper Extremity Assessment Upper Extremity Assessment: Right hand dominant;RUE deficits/detail;LUE deficits/detail RUE Deficits / Details: longstanding shoulder limitations RUE Sensation: decreased light touch RUE Coordination: decreased gross motor LUE Deficits / Details:  longstanding shoulder limitations LUE Sensation: decreased light touch LUE Coordination: decreased fine motor       Cervical / Trunk Assessment Cervical / Trunk Assessment: Kyphotic   Communication Communication Communication: Impaired Factors Affecting Communication: Hearing impaired   Cognition Arousal: Alert Behavior During Therapy: WFL for tasks assessed/performed               OT - Cognition Comments: impaired safety  awareness, sits abruptly                 Following commands: Intact       Cueing  General Comments   Cueing Techniques: Verbal cues    SpO2 95% on RA   Exercises     Shoulder Instructions      Home Living Family/patient expects to be discharged to:: Private residence Living Arrangements: Spouse/significant other Available Help at Discharge: Family;Available 24 hours/day Type of Home: House Home Access: Ramped entrance     Home Layout: Multi-level;Able to live on main level with bedroom/bathroom Alternate Level Stairs-Number of Steps: main level + basement   Bathroom Shower/Tub: Chief Strategy Officer: Standard     Home Equipment: Agricultural consultant (2 wheels);Rollator (4 wheels);Lift chair;Shower seat   Additional Comments: sleeps in lift chair      Prior Functioning/Environment Prior Level of Function : Needs assist             Mobility Comments: walks with RW ADLs Comments: Reports he bathes & dresses without assistance, assisted for IADLs.    OT Problem List: Decreased strength;Impaired balance (sitting and/or standing);Decreased activity tolerance;Cardiopulmonary status limiting activity   OT Treatment/Interventions: Self-care/ADL training;DME and/or AE instruction;Therapeutic activities;Patient/family education;Balance training      OT Goals(Current goals can be found in the care plan section)   Acute Rehab OT Goals OT Goal Formulation: With patient Time For Goal Achievement: 04/18/24 Potential to Achieve Goals: Good ADL Goals Pt Will Perform Grooming: with modified independence;standing Pt Will Perform Lower Body Bathing: with modified independence;sit to/from stand Pt Will Perform Lower Body Dressing: with modified independence;sit to/from stand Pt Will Transfer to Toilet: with modified independence;ambulating Pt Will Perform Toileting - Clothing Manipulation and hygiene: with modified independence;sit to/from stand   OT  Frequency:  Min 2X/week    Co-evaluation              AM-PAC OT "6 Clicks" Daily Activity     Outcome Measure Help from another person eating meals?: None Help from another person taking care of personal grooming?: A Little Help from another person toileting, which includes using toliet, bedpan, or urinal?: A Little Help from another person bathing (including washing, rinsing, drying)?: A Little Help from another person to put on and taking off regular upper body clothing?: A Little Help from another person to put on and taking off regular lower body clothing?: A Little 6 Click Score: 19   End of Session Equipment Utilized During Treatment: Gait belt;Rolling walker (2 wheels)  Activity Tolerance: Patient tolerated treatment well Patient left: in chair;with call bell/phone within reach  OT Visit Diagnosis: Unsteadiness on feet (R26.81);Other abnormalities of gait and mobility (R26.89);Muscle weakness (generalized) (M62.81)                Time: 9147-8295 OT Time Calculation (min): 20 min Charges:  OT General Charges $OT Visit: 1 Visit OT Evaluation $OT Eval Moderate Complexity: 1 Mod  Avanell Leigh, OTR/L Acute Rehabilitation Services Office: [phone]   Jolin, Milbert 04/10/2024, 9:42 AM

## 2024-04-10 NOTE — Progress Notes (Signed)
 Initial Nutrition Assessment  DOCUMENTATION CODES:   Non-severe (moderate) malnutrition in context of acute illness/injury  INTERVENTION:  MVI with minerals.   Encourage po intake.   NUTRITION DIAGNOSIS:   Moderate Malnutrition related to acute illness as evidenced by mild fat depletion, mild muscle depletion.  GOAL:   Patient will meet greater than or equal to 90% of their needs  MONITOR:   PO intake, Supplement acceptance  REASON FOR ASSESSMENT:   Consult Assessment of nutrition requirement/status  ASSESSMENT:   PMH COPD on 4 L oxygen , diastolic CHF, hypothyroidism, hyperlipidemia, neuropathy has been experiencing shortness of breath for the last 2 weeks at rest and exertion. Also noticed increasing lower extremity edema. Adm with acute CHF.  RD met with pt at bedside. Pt's granddaughter present in room. Pt reports appetite to be intact. Pt eats 2-3 meals a day at baseline. Pt gets meals on wheels delivered daily for lunch and pt has son who goes out to eat and brings dinner nightly. Pt declined ONS or extra snacks. Pt denies any recent wt loss in past year.   Intake: Nursing flowsheets document meal completion 75% x1.  Medications reviewed and include: Synthroid , potassium chloride , lasix   Labs reviewed: BUN 28 H, Creatinine 1.43 H   Intake/Output Summary (Last 24 hours) at 04/10/2024 1212 Last data filed at 04/10/2024 0445 Gross per 24 hour  Intake 700 ml  Output 2750 ml  Net -2050 ml    Weights reviewed. Admit weight 86.6 kg. Current weight 82.5 kg due to fluid loss.    NUTRITION - FOCUSED PHYSICAL EXAM:  Flowsheet Row Most Recent Value  Orbital Region Mild depletion  Upper Arm Region No depletion  Thoracic and Lumbar Region No depletion  Buccal Region Mild depletion  Temple Region Mild depletion  Clavicle Bone Region No depletion  Clavicle and Acromion Bone Region No depletion  Scapular Bone Region No depletion  Dorsal Hand Mild depletion  Patellar  Region No depletion  Anterior Thigh Region No depletion  Posterior Calf Region Unable to assess  [edema]  Edema (RD Assessment) Mild  Hair Reviewed  Eyes Reviewed  Mouth Reviewed  Skin Reviewed  Nails Reviewed       Diet Order:   Diet Order             Diet Heart Room service appropriate? Yes; Fluid consistency: Thin; Fluid restriction: 1500 mL Fluid  Diet effective now                   EDUCATION NEEDS:   Education needs have been addressed  Skin:  Skin Assessment: Skin Integrity Issues: Skin Integrity Issues:: Incisions Incisions: non-pressure pretibial R, R buttocks abrasion, L foot fluid filled blister  Last BM:  4/28  Height:   Ht Readings from Last 1 Encounters:  04/09/24 5\' 6"  (1.676 m)    Weight:   Wt Readings from Last 1 Encounters:  04/10/24 82.5 kg    BMI:  Body mass index is 29.36 kg/m.  Estimated Nutritional Needs:   Kcal:  1850-2100  Protein:  100-125  Fluid:  per MD  Laren Player, MPH, RD, LDN Clinical Dietitian Contact information can be found at [facility].

## 2024-04-10 NOTE — Progress Notes (Signed)
 Mobility Specialist Progress Note:    04/10/24 0950  Mobility  Activity Ambulated with assistance to bathroom  Level of Assistance Minimal assist, patient does 75% or more  Assistive Device Front wheel walker  Distance Ambulated (ft) 10 ft  Activity Response Tolerated well  Mobility Referral Yes  Mobility visit 1 Mobility  Mobility Specialist Start Time (ACUTE ONLY) 0930  Mobility Specialist Stop Time (ACUTE ONLY) [id number]  Mobility Specialist Time Calculation (min) (ACUTE ONLY) 12 min   Pt received in chair requesting to go to the [facility], agreeable to ambulate some to the BR. Pt needed MinA w/ STS contact guard during ambulation. Pt needed min VC for walker management. Left in BR, instructed to use call light when finished. NT aware.  Inetta Manes Mobility Specialist  Please contact vis Secure Chat or  Rehab Office [phone]

## 2024-04-10 NOTE — Hospital Course (Addendum)
 Eric [name] was admitted to the hospital with the working diagnosis of acute on chronic diastolic heart failure.   89/M w  COPD, CHF, RV failure, atrial fibrillation, hypothyroidism, hyperlipidemia, neuropathy has been experiencing dyspnea for the last 2 weeks at rest and on exertion., along with lower extremity edema.  Recently patient has been bradycardic and his beta-blockers were discontinued. On his initial physical examination his blood pressure was 156/68, HR 63, RR 17 and 02 saturation 95% Lungs with no wheezing or rhonchi, heart with S1 and S2 present and regular with no gallops, rubs or murmurs, abdomen with no distention and positive lower extremity edema.   Na 141, K 4,1 Cl 106 bicarbonate 28 glucose 88, bun 26 cr 1,12 BNP 255  High sensitive troponin 18  Wbc 5,8 hgb 13,8 plt 152   Chest radiograph with hypoinflation, cardiomegaly with bilateral hilar vascular congestion, fluid in the right fissure.   CT chest with bilateral ground glass opacities with small right pleural effusion.  Negative for pulmonary embolism.   EKG 51 bpm, left axis deviation, normal intervals, qtc 436, atrial flutter rhythm with PAC and PVC, with no significant ST segment or T wave changes.   Patient was placed on furosemide  for diuresis.  05/01 improved volume status, pending renal function to be more stable.

## 2024-04-11 ENCOUNTER — Ambulatory Visit: Admitting: Adult Health

## 2024-04-11 LAB — BASIC METABOLIC PANEL WITH GFR
Anion gap: 11 (ref 5–15)
BUN: 37 mg/dL — ABNORMAL HIGH (ref 8–23)
CO2: 29 mmol/L (ref 22–32)
Calcium: 8.8 mg/dL — ABNORMAL LOW (ref 8.9–10.3)
Chloride: 97 mmol/L — ABNORMAL LOW (ref 98–111)
Creatinine, Ser: 1.47 mg/dL — ABNORMAL HIGH (ref 0.61–1.24)
GFR, Estimated: 45 mL/min — ABNORMAL LOW (ref >60)
Glucose, Bld: 86 mg/dL (ref 70–99)
Potassium: 4.1 mmol/L (ref 3.5–5.1)
Sodium: 137 mmol/L (ref 135–145)

## 2024-04-11 NOTE — Plan of Care (Signed)
   Problem: Clinical Measurements: Goal: Will remain free from infection Outcome: Progressing Goal: Diagnostic test results will improve Outcome: Progressing

## 2024-04-11 NOTE — Progress Notes (Signed)
 Physical Therapy Treatment Patient Details Name: Eric [name] MRN: [id number] DOB: 09-15-1932 Today's Date: 04/11/2024   History of Present Illness Patient is an 89 y/o male admitted 04/08/24 with SOB found to have bilateral pleural effusion with wheeze and elevated BNP.  Admitted for acute CHF with COPD exacerbation.  PMH positive for CHR, COPD, arthritis, cervical DDD, dysphagia, h/o DVT, HTN, HLD, ischemic colitis, PAF, SCC of skin, PNA and L foot fracture.    PT Comments  Pt sitting in chair on arrival, pleasant and agreeable to therapy session, continuing to make progress towards acute goals. Pt demonstrated sit<>stand transfer with RW support and up to modA to boost to stand and to extend knees and trunk from chair height. Pt with increased gait distance this session with RW support and CGA for safety with SpO2 >90% on RA throughout and 94% at end of session. Pt educated on time up out of bed importance, with pt verbalizing understanding. Pt continues to benefit from skilled PT services to progress toward functional mobility goals.      If plan is discharge home, recommend the following: A little help with walking and/or transfers;A little help with bathing/dressing/bathroom;Assist for transportation;Help with stairs or ramp for entrance   Can travel by private vehicle        Equipment Recommendations  None recommended by PT    Recommendations for Other Services       Precautions / Restrictions Precautions Precautions: Fall     Mobility  Bed Mobility               General bed mobility comments: OOB in chair    Transfers Overall transfer level: Needs assistance Equipment used: Rolling walker (2 wheels) Transfers: Sit to/from Stand Sit to Stand: Min assist, Mod assist           General transfer comment: up to modA to boost to stand and to extend knees and trunk    Ambulation/Gait Ambulation/Gait assistance: Contact guard assist Gait Distance (Feet): 85  Feet Assistive device: Rolling walker (2 wheels) Gait Pattern/deviations: Step-through pattern, Decreased stride length, Trunk flexed, Decreased dorsiflexion - right, Decreased dorsiflexion - left, Shuffle, Decreased stance time - right       General Gait Details: CGA for saftey,SpO2 >90% throughout   Stairs             Wheelchair Mobility     Tilt Bed    Modified Rankin (Stroke Patients Only)       Balance Overall balance assessment: Needs assistance Sitting-balance support: No upper extremity supported, Feet supported Sitting balance-Leahy Scale: Fair Sitting balance - Comments: in chair on arrival and end of session   Standing balance support: Bilateral upper extremity supported Standing balance-Leahy Scale: Poor Standing balance comment: reliant on RW for support                            Communication Communication Communication: Impaired Factors Affecting Communication: Hearing impaired  Cognition Arousal: Alert Behavior During Therapy: WFL for tasks assessed/performed   PT - Cognitive impairments: No apparent impairments                         Following commands: Intact      Cueing Cueing Techniques: Verbal cues  Exercises      General Comments General comments (skin integrity, edema, etc.): VSS on RA, SpO2 >90% throughout gait and 94% at end on session  Pertinent Vitals/Pain Pain Assessment Pain Assessment: Faces Faces Pain Scale: Hurts a little bit Pain Location: R leg ulcer Pain Descriptors / Indicators: Sore, Tender Pain Intervention(s): Limited activity within patient's tolerance, Monitored during session    Home Living                          Prior Function            PT Goals (current goals can now be found in the care plan section) Acute Rehab PT Goals PT Goal Formulation: With patient/family Time For Goal Achievement: 04/23/24    Frequency    Min 2X/week      PT Plan       Co-evaluation              AM-PAC PT "6 Clicks" Mobility   Outcome Measure  Help needed turning from your back to your side while in a flat bed without using bedrails?: A Little Help needed moving from lying on your back to sitting on the side of a flat bed without using bedrails?: A Little Help needed moving to and from a bed to a chair (including a wheelchair)?: A Little Help needed standing up from a chair using your arms (e.g., wheelchair or bedside chair)?: A Little Help needed to walk in hospital room?: A Little Help needed climbing 3-5 steps with a railing? : Total 6 Click Score: 16    End of Session Equipment Utilized During Treatment: Gait belt Activity Tolerance: Patient tolerated treatment well;Patient limited by fatigue Patient left: in chair;with call bell/phone within reach;with chair alarm set Nurse Communication: Mobility status;Other (comment) (O2 sats) PT Visit Diagnosis: Other abnormalities of gait and mobility (R26.89);Muscle weakness (generalized) (M62.81)     Time: 1006-1030 PT Time Calculation (min) (ACUTE ONLY): 24 min  Charges:    $Gait Training: 23-37 mins PT General Charges $$ ACUTE PT VISIT: 1 Visit                     Forrest Iha 04/11/2024, 4:16 PM

## 2024-04-11 NOTE — TOC Progression Note (Addendum)
 Transition of Care [facility]) - Progression Note    Patient Details  Name: Eric [name] MRN: [id number] Date of Birth: Sep 25, 1932  Transition of [facility]) CM/SW Contact  Jennett Model, RN Phone Number: 04/11/2024, 10:58 AM  Clinical Narrative:    Per pt/ot eval rec HHPT, and HHOT, HHRN.  Patient has stated he wants [facility], NCM made referral to [facility] with [facility]. She is able to take referral.  Soc will begin 24 to 48 hrs post dc.          Expected Discharge Plan and Services                                               Social Determinants of Health (SDOH) Interventions SDOH Screenings   Food Insecurity: No Food Insecurity (04/09/2024)  Housing: Low Risk  (04/09/2024)  Transportation Needs: No Transportation Needs (04/09/2024)  Utilities: Not At Risk (04/09/2024)  Alcohol Screen: Low Risk  (08/20/2018)  Depression (PHQ2-9): Low Risk  (03/31/2023)  Financial Resource Strain: Low Risk  (03/09/2018)  Physical Activity: Insufficiently Active (03/09/2018)  Social Connections: Socially Integrated (04/09/2024)  Stress: No Stress Concern Present (03/09/2018)  Tobacco Use: Medium Risk (04/09/2024)    Readmission Risk Interventions    04/09/2024    2:54 PM 09/20/2023   11:48 AM  Readmission Risk Prevention Plan  Post Dischage Appt  Complete  Medication Screening  Complete  Transportation Screening Complete Complete  PCP or Specialist Appt within 5-7 Days Complete   Home Care Screening Complete   Medication Review (RN CM) Complete

## 2024-04-11 NOTE — Plan of Care (Signed)

## 2024-04-11 NOTE — Progress Notes (Signed)
 Progress Note   Patient: Eric [name] UYQ:[id number] DOB: [birth year] DOA: 04/08/2024     2 DOS: the patient was seen and examined on 04/11/2024   Brief hospital course: Eric [name] was admitted to the hospital with the working diagnosis of acute on chronic diastolic heart failure.   89/M w  COPD, diastolic CHF, RV failure, hypothyroidism, hyperlipidemia, neuropathy has been experiencing dyspnea for the last 2 weeks at rest and exertion.  Also noticed increasing lower extremity edema.   Recently patient has been bradycardic and his beta-blockers were discontinued.  Also has intermittent cough and wheezing  Chest radiograph with hypoinflation, cardiomegaly with bilateral hilar vascular congestion, fluid in the right fissure.   CT chest with bilateral ground glass opacities with small right pleural effusion.  Negative for pulmonary embolism.   05/01 improved volume status, pending renal function to be more stable.   Assessment and Plan: * Acute on chronic diastolic CHF (congestive heart failure) (HCC) Echocardiogram with preserved LV systolic function with EF 55 to 60%, mild LVH, RV systolic function preserved, severe dilatation of LA and RA, mild to moderate mitral regurgitation, no aortic stenosis.   Improved volume status, patient has lost about 1 kg over last 24 hrs.  Systolic blood pressure 120 mmHg range  Plan to hold on furosemide  for now until renal function more stable.  Limited guideline directed medical therapy due to acutely reduced GFR.   PAF (paroxysmal atrial fibrillation) (HCC) Continue anticoagulation with apixaban  Patient not on AV blocking agents.  Chronic kidney disease, stage 3a (HCC) AKI  Renal function today with serum cr at 1,47 with K at 4,1 and serum bicarbonate at 29  Na 137   Hold on further diuresis and follow up renal function and electrolytes in am.  Avoid hypotension and nephrotoxic medications   COPD with acute exacerbation (HCC) No signs of acute  exacerbation  Continue bronchodilator therapy  Out of bed to chair   GERD (gastroesophageal reflux disease) Continue pantoprazole    Hypertension Continue blood pressure monitoring   Hypothyroidism Continue with levothyroxine    Mixed hyperlipidemia Continue statin therapy   Malnutrition of moderate degree Continue with nutritional supplements.       Subjective: Patient with improvement in dyspnea and edema, no chest pain, he has been ambulating with PT   Physical Exam: Vitals:   04/11/24 0015 04/11/24 0350 04/11/24 0720 04/11/24 1115  BP: 125/67 (!) 125/57 (!) 124/59 114/63  Pulse:  65 (!) 47 (!) 42  Resp: (!) 22 (!) 22 20 17   Temp: 98.2 F (36.8 C) 98.2 F (36.8 C) 97.6 F (36.4 C) 97.7 F (36.5 C)  TempSrc: Oral Oral Oral Oral  SpO2:  93% 91% 96%  Weight:  81.6 kg    Height:       Neurology awake and alert ENT with mild pallor Cardiovascular with S1 and S2 present and regular with no gallops, or rubs, positive systolic murmur at the apex.  Respiratory with no rales or wheezing, prolonged expiratory phase with scattered rhonchi  Abdomen with no distention  Lower extremity with trace non pitting edema  Data Reviewed:    Family Communication: no family at the bedside  I spoke with patient's granddaughter over the phone, we talked in detail about patient's condition, plan of care and prognosis and all questions were addressed.   Disposition: Status is: Inpatient Remains inpatient appropriate because: pending renal function to improve   Planned Discharge Destination: Home     Author: Albertus Alt, MD 04/11/2024  1:56 PM  For on call review [email or web address].

## 2024-04-11 NOTE — Progress Notes (Signed)
 Patient Name: Paige Lacour Quinney Date of Encounter: 04/11/2024 [facility] Cardiologist: Belva Boyden, MD   Interval Summary  .    Patient reports a poor night of sleep, cites a frequent dry cough. He notes improved breathing from admission but still feels like he is not quite back to baseline. Isolated episode of chest pain reported early this morning.   Vital Signs .    Vitals:   04/10/24 1927 04/11/24 0015 04/11/24 0350 04/11/24 0720  BP: 124/63 125/67 (!) 125/57 (!) 124/59  Pulse: 60  65 (!) 47  Resp: 18 (!) 22 (!) 22 20  Temp: 98.2 F (36.8 C) 98.2 F (36.8 C) 98.2 F (36.8 C) 97.6 F (36.4 C)  TempSrc: Oral Oral Oral Oral  SpO2: 94%  93% 91%  Weight:   81.6 kg   Height:        Intake/Output Summary (Last 24 hours) at 04/11/2024 0749 Last data filed at 04/10/2024 1848 Gross per 24 hour  Intake 714 ml  Output 50 ml  Net 664 ml      04/11/2024    3:50 AM 04/10/2024    4:46 AM 04/09/2024    2:13 PM  Last 3 Weights  Weight (lbs) 180 lb 181 lb 14.1 oz 182 lb 12.2 oz  Weight (kg) 81.647 kg 82.5 kg 82.9 kg      Telemetry/ECG    Persistent afib with ventricular rates 40s-60s, frequent PVCs - Personally Reviewed  Physical Exam .   GEN: No acute distress.   Neck: No JVD Cardiac: irregularly irregular, no murmurs, rubs, or gallops.  Respiratory: bilateral rhonchi. No pulmonary crackles noted GI: Soft, nontender, non-distended  MS: No edema visible in LLE. RLE is wrapped to knee  Assessment & Plan .     89 year old male with persistent afib, PVCs, prior tobacco use, dilated ascending aorta, HFpEF, COPD with chronic O2 dependent respiratory failure, hyperlipidemia, hypertension, hypothyroidism. Patient seen in clinic on 04/28 and sent to the ED for management of worsening dyspnea.  Acute on chronic HFpEF Mild elevation of PA systolic pressure Patient admitted with chronic lower extremity edema and worsening dyspnea with hypoxia. BNP found to be 256. Chest CT with  small bilateral pleural effusions. TTE with LVEF 55-60%, indeterminate DD, normal RV function. Patient diuresed aggressively on admission, 2.75 L output on 04/29 with reported weight decrease of 10lbs. With this significant output, he was noted with creatinine elevation 1.24->1.43 and lasix  held 04/30. Creatinine with slight increase this morning 1.43->1.47. BUN did rise slightly as well 28->37. On exam, patient appears euvolemic and I do not appreciate any pulmonary crackles consistent with fluid. Initial plan for today was to resume home PO lasix  but given his exam and labs, would hold diuretics for an additional day.  Consider adding both SGLT2 at discharge Patient would also benefit from MRA if BP and renal function allow  Permanent atrial fibrillation Slow ventricular response Secondary hypercoagulable state Patient with bradycardia noted this admission, HR as low as 30s. This was also noted prior to this admission prompting his PCP to hold Lopressor . Of note, patient previously on Amiodarone , stopped in 2021 due to thyroid  disease. No prolonged bradycardia overnight. Continue to hold beta blocker Continue Eliauis  Hypertension Low normal to normal BP this admission. As above, avoiding beta blockers with slow ventricular response.   Acute on chronic hypoxic respiratory failure COPD Patient currently on home O2 flow. Suspect that a degree of the dyspnea prompting this admission could have  been due to COPD exacerbation. COPD inhaler regimen per primary team  For questions or updates, please contact [facility] Please consult [email or web address] for contact info under        Signed, Leala Prince, PA-C

## 2024-04-12 ENCOUNTER — Other Ambulatory Visit (HOSPITAL_COMMUNITY): Payer: Self-pay

## 2024-04-12 ENCOUNTER — Telehealth (HOSPITAL_COMMUNITY): Payer: Self-pay | Admitting: Pharmacy Technician

## 2024-04-12 DIAGNOSIS — I480 Paroxysmal atrial fibrillation: Secondary | ICD-10-CM | POA: Diagnosis not present

## 2024-04-12 DIAGNOSIS — I5033 Acute on chronic diastolic (congestive) heart failure: Secondary | ICD-10-CM

## 2024-04-12 DIAGNOSIS — E039 Hypothyroidism, unspecified: Secondary | ICD-10-CM

## 2024-04-12 DIAGNOSIS — N1831 Chronic kidney disease, stage 3a: Secondary | ICD-10-CM | POA: Diagnosis not present

## 2024-04-12 DIAGNOSIS — E440 Moderate protein-calorie malnutrition: Secondary | ICD-10-CM

## 2024-04-12 DIAGNOSIS — E782 Mixed hyperlipidemia: Secondary | ICD-10-CM

## 2024-04-12 DIAGNOSIS — J441 Chronic obstructive pulmonary disease with (acute) exacerbation: Secondary | ICD-10-CM | POA: Diagnosis not present

## 2024-04-12 DIAGNOSIS — K219 Gastro-esophageal reflux disease without esophagitis: Secondary | ICD-10-CM

## 2024-04-12 LAB — BASIC METABOLIC PANEL WITH GFR
Anion gap: 11 (ref 5–15)
BUN: 38 mg/dL — ABNORMAL HIGH (ref 8–23)
CO2: 26 mmol/L (ref 22–32)
Calcium: 8.7 mg/dL — ABNORMAL LOW (ref 8.9–10.3)
Chloride: 101 mmol/L (ref 98–111)
Creatinine, Ser: 1.27 mg/dL — ABNORMAL HIGH (ref 0.61–1.24)
GFR, Estimated: 54 mL/min — ABNORMAL LOW (ref >60)
Glucose, Bld: 107 mg/dL — ABNORMAL HIGH (ref 70–99)
Potassium: 3.8 mmol/L (ref 3.5–5.1)
Sodium: 138 mmol/L (ref 135–145)

## 2024-04-12 MED ORDER — SPIRONOLACTONE 12.5 MG HALF TABLET
12.5000 mg | ORAL_TABLET | Freq: Every day | ORAL | Status: DC
  Administered 2024-04-12: 12.5 mg via ORAL
  Filled 2024-04-12: qty 1

## 2024-04-12 MED ORDER — FUROSEMIDE 40 MG PO TABS
40.0000 mg | ORAL_TABLET | Freq: Every day | ORAL | Status: DC
  Administered 2024-04-12: 40 mg via ORAL
  Filled 2024-04-12: qty 1

## 2024-04-12 MED ORDER — FUROSEMIDE 40 MG PO TABS
40.0000 mg | ORAL_TABLET | Freq: Every day | ORAL | 0 refills | Status: AC
  Filled 2024-04-12: qty 30, 30d supply, fill #0

## 2024-04-12 MED ORDER — SPIRONOLACTONE 25 MG PO TABS
12.5000 mg | ORAL_TABLET | Freq: Every day | ORAL | 0 refills | Status: DC
  Filled 2024-04-12: qty 15, 30d supply, fill #0

## 2024-04-12 MED ORDER — EMPAGLIFLOZIN 10 MG PO TABS
10.0000 mg | ORAL_TABLET | Freq: Every day | ORAL | 0 refills | Status: DC
  Filled 2024-04-12: qty 30, 30d supply, fill #0

## 2024-04-12 MED ORDER — EMPAGLIFLOZIN 10 MG PO TABS
10.0000 mg | ORAL_TABLET | Freq: Every day | ORAL | Status: DC
  Administered 2024-04-12: 10 mg via ORAL
  Filled 2024-04-12: qty 1

## 2024-04-12 NOTE — TOC Transition Note (Signed)
 Transition of Care [facility]) - Discharge Note   Patient Details  Name: Eric [name] MRN: [id number] Date of Birth: [birth year]  Transition of Care [facility]) CM/SW Contact:  Ronni Colace, RN Phone Number: 04/12/2024, 10:07 AM   Clinical Narrative:     Patient [name] be transitioning to home today. [facility] made aware.  No further needs identified    Barriers to Discharge: No Barriers Identified   Patient Goals and CMS Choice     Choice offered to / list presented to : Patient      Discharge Placement             Home with home health          Discharge Plan and Services Additional resources added to the After Visit Summary for                            [facility] Arranged: PT, OT, RN [facility] Agency: [facility] Date [facility] Agency Contacted: 04/11/24      Social Drivers of Health (SDOH) Interventions SDOH Screenings   Food Insecurity: No Food Insecurity (04/09/2024)  Housing: Low Risk  (04/09/2024)  Transportation Needs: No Transportation Needs (04/09/2024)  Utilities: Not At Risk (04/09/2024)  Alcohol Screen: Low Risk  (08/20/2018)  Depression (PHQ2-9): Low Risk  (03/31/2023)  Financial Resource Strain: Low Risk  (03/09/2018)  Physical Activity: Insufficiently Active (03/09/2018)  Social Connections: Socially Integrated (04/09/2024)  Stress: No Stress Concern Present (03/09/2018)  Tobacco Use: Medium Risk (04/09/2024)     Readmission Risk Interventions    04/09/2024    2:54 PM 09/20/2023   11:48 AM  Readmission Risk Prevention Plan  Post Dischage Appt  Complete  Medication Screening  Complete  Transportation Screening Complete Complete  PCP or Specialist Appt within 5-7 Days Complete   Home Care Screening Complete   Medication Review (RN CM) Complete

## 2024-04-12 NOTE — Discharge Summary (Signed)
 Physician Discharge Summary   Patient: Eric [name] MRN: [id number] DOB: [birth year]  Admit date:     04/08/2024  Discharge date: 04/12/24  Discharge Physician: Curlee Doss Sianne Tejada   PCP: Eric Gobble, NP   Recommendations at discharge:    Patient will continue diuresis with oral furosemide  40 mg po daily  Limited guideline directed medical therapy due to acutely reduced GFR, added SGLT 2 inh and spironolactone . Follow up renal function and electrolytes in 7 days as outpatient Follow up with Eric Lab NP in 7 to 10 days Follow up with Cardiology as scheduled.   Discharge Diagnoses: Principal Problem:   Acute on chronic diastolic CHF (congestive heart failure) (HCC) Active Problems:   PAF (paroxysmal atrial fibrillation) (HCC)   Chronic kidney disease, stage 3a (HCC)   COPD with acute exacerbation (HCC)   GERD (gastroesophageal reflux disease)   Hypertension   Hypothyroidism   Mixed hyperlipidemia   Malnutrition of moderate degree  Resolved Problems:   * No resolved hospital problems. [facility] Course: Eric [name] was admitted to the hospital with the working diagnosis of acute on chronic diastolic heart failure.   89/M w  COPD, CHF, RV failure, atrial fibrillation, hypothyroidism, hyperlipidemia, neuropathy has been experiencing dyspnea for the last 2 weeks at rest and on exertion., along with lower extremity edema.  Recently patient has been bradycardic and his beta-blockers were discontinued. On his initial physical examination his blood pressure was 156/68, HR 63, RR 17 and 02 saturation 95% Lungs with no wheezing or rhonchi, heart with S1 and S2 present and regular with no gallops, rubs or murmurs, abdomen with no distention and positive lower extremity edema.   Na 141, K 4,1 Cl 106 bicarbonate 28 glucose 88, bun 26 cr 1,12 BNP 255  High sensitive troponin 18  Wbc 5,8 hgb 13,8 plt 152   Chest radiograph with hypoinflation, cardiomegaly with bilateral hilar  vascular congestion, fluid in the right fissure.   CT chest with bilateral ground glass opacities with small right pleural effusion.  Negative for pulmonary embolism.   EKG 51 bpm, left axis deviation, normal intervals, qtc 436, atrial flutter rhythm with PAC and PVC, with no significant ST segment or T wave changes.   Patient was placed on furosemide  for diuresis.  05/01 improved volume status, pending renal function to be more stable.   Assessment and Plan: * Acute on chronic diastolic CHF (congestive heart failure) (HCC) Echocardiogram with preserved LV systolic function with EF 55 to 60%, mild LVH, RV systolic function preserved, severe dilatation of LA and RA, mild to moderate mitral regurgitation, no aortic stenosis.   Patient was placed on furosemide  for diuresis, negative fluid balance was achieved, -2,531 ml, with significant improvement in his symptoms.   Plan to continue diuresis with furosemide  and added SLGT 2 inh.   RAAS inhibition with spironolactone   Limited guideline directed medical therapy due to acutely reduced GFR.   PAF (paroxysmal atrial fibrillation) (HCC) Continue anticoagulation with apixaban .  Telemetry with atrial flutter and atrial fibrillation, positive bigeminy.  Patient not on AV blocking agents due to bradycardia.   Chronic kidney disease, stage 3a (HCC) AKI  Renal function at the time of discharge with improving serum cr to 1,27 with K at 3,8 and serum bicarbonate at 26  Na 138   Plan to continue diuresis with furosemide  40 mg po daily and follow up renal function as outpatient.   COPD with acute exacerbation (HCC) No signs of acute exacerbation  Continue bronchodilator therapy  Continue home 02 at night  Home health PT   GERD (gastroesophageal reflux disease) Continue pantoprazole    Hypertension Continue blood pressure monitoring  Currently off antihypertensive medications.   Hypothyroidism Continue with levothyroxine    Mixed  hyperlipidemia Continue statin therapy   Malnutrition of moderate degree Continue with nutritional supplements.          Consultants: cardiology  Procedures performed: none   Disposition: Home Diet recommendation:  Cardiac diet DISCHARGE MEDICATION: Allergies as of 04/12/2024       Reactions   Morphine And Codeine Shortness Of Breath   Percocet [oxycodone-acetaminophen ] Shortness Of Breath   Valium Shortness Of Breath        Medication List     STOP taking these medications    doxycycline  100 MG tablet Commonly known as: VIBRA -TABS   fluticasone  50 MCG/ACT nasal spray Commonly known as: FLONASE    metoprolol  tartrate 25 MG tablet Commonly known as: LOPRESSOR    potassium chloride  SA 20 MEQ tablet Commonly known as: KLOR-CON  M   saccharomyces boulardii 250 MG capsule Commonly known as: Florastor       TAKE these medications    albuterol  (2.5 MG/3ML) 0.083% nebulizer solution Commonly known as: PROVENTIL  TAKE 3MLS(1 VIAL) VIA NEBULIZER EVERY 6 HOURS AS NEEDED What changed: See the new instructions.   cetirizine  10 MG tablet Commonly known as: ZYRTEC  TAKE 1 TABLET(10 MG) BY MOUTH DAILY   Eliquis  5 MG Tabs tablet Generic drug: apixaban  TAKE 1 TABLET BY MOUTH TWICE DAILY   empagliflozin  10 MG Tabs tablet Commonly known as: JARDIANCE  Take 1 tablet (10 mg total) by mouth daily. Start taking on: Joe 3, 2025   furosemide  40 MG tablet Commonly known as: LASIX  Take 1 tablet (40 mg total) by mouth daily. Start taking on: Rhue 3, 2025 What changed: Another medication with the same name was removed. Continue taking this medication, and follow the directions you see here.   gabapentin  300 MG capsule Commonly known as: NEURONTIN  TAKE 1 CAPSULE(300 MG) BY MOUTH TWICE DAILY   guaiFENesin -dextromethorphan  100-10 MG/5ML syrup Commonly known as: ROBITUSSIN DM Take 10 mLs by mouth every 4 (four) hours as needed for cough.   levothyroxine  25 MCG  tablet Commonly known as: SYNTHROID  TAKE 1 TABLET BY MOUTH EVERY DAY BEFORE BREAKFAST AS DIRECTED   OXYGEN  Inhale 4 L/min into the lungs at bedtime.   pravastatin  40 MG tablet Commonly known as: PRAVACHOL  TAKE 1 TABLET BY MOUTH EVERY DAY What changed: when to take this   spironolactone  25 MG tablet Commonly known as: ALDACTONE  Take 0.5 tablets (12.5 mg total) by mouth daily. Start taking on: Riehl 3, 2025   Trelegy Ellipta  100-62.5-25 MCG/ACT Aepb Generic drug: Fluticasone -Umeclidin-Vilant Inhale 1 puff into the lungs daily.   Vitamin D3 1000 units Caps Take 1,000 Units by mouth daily.               Discharge Care Instructions  (From admission, onward)           Start     Ordered   04/12/24 0000  Discharge wound care:       Comments: Wound care  Daily      Comments: Cleanse wound to right anterior lower leg with NS and pat dry. Apply Xeroform gauze to wound bed. Cover with dry gauze and tape. Change daily.   04/12/24 1030            Follow-up Information     Eric Gobble, NP Follow  up.   Specialty: Geriatric Medicine Contact information: [address] [place] [address] [phone]         [facility] Follow up.   Specialty: Home Health Services Why: Agency will contact you to set up apt times Contact information: [address] [place] [address] [phone]                Discharge Exam: Filed Weights   04/10/24 0446 04/11/24 0350 04/12/24 0440  Weight: 82.5 kg 81.6 kg 81.5 kg   BP 134/77 (BP Location: Left Arm)   Pulse (!) 58   Temp (!) 97.5 F (36.4 C) (Oral)   Resp (!) 23   Ht 5\' 6"  (1.676 m)   Wt 81.5 kg   SpO2 95%   BMI 29.00 kg/m   Patient is feeling well with no chest pain or dyspnea, lower extremity edema has improved, no PND or orthopnea, lower extremity edema has resolved.   Neurology awake and alert ENT with mild pallor with no icterus Cardiovascular with S1 and S2 present  and regular, positive extra beats with no gallops, positive systolic murmur at the apex.  No JVD Lower extremity with no edema Respiratory with prolonged expiratory phase with scattered  rhonchi with no rales or wheezing Abdomen with no distention   Condition at discharge: stable  The results of significant diagnostics from this hospitalization (including imaging, microbiology, ancillary and laboratory) are listed below for reference.   Imaging Studies: VAS US  LOWER EXTREMITY VENOUS (DVT) (7a-7p) Result Date: 04/10/2024  Lower Venous DVT Study Patient Name:  Eric [name]  Date of Exam:   04/09/2024 Medical Rec #: [id number]   Accession #:    [id number] Date of Birth: 1932/04/06    Patient Gender: M Patient Age:   67 years Exam Location[facility]  [facility] Procedure:      VAS US  LOWER EXTREMITY VENOUS (DVT) Referring Phys: Melford Spotted --------------------------------------------------------------------------------  Indications: SOB & increased leg swelling.  Anticoagulation: Eliquis . Performing Technologist: Arlyce Berger RVT, RDMS  Examination Guidelines: A complete evaluation includes B-mode imaging, spectral Doppler, color Doppler, and power Doppler as needed of all accessible portions of each vessel. Bilateral testing is considered an integral part of a complete examination. Limited examinations for reoccurring indications Schomer be performed as noted. The reflux portion of the exam is performed with the patient in reverse Trendelenburg.  +---------+---------------+---------+-----------+----------+-------------------+ RIGHT    CompressibilityPhasicitySpontaneityPropertiesThrombus Aging      +---------+---------------+---------+-----------+----------+-------------------+ CFV      Full           Yes      Yes                                      +---------+---------------+---------+-----------+----------+-------------------+ SFJ      Full                                                              +---------+---------------+---------+-----------+----------+-------------------+ FV Prox  Full           Yes      Yes                                      +---------+---------------+---------+-----------+----------+-------------------+  FV Mid   Full           Yes      Yes                                      +---------+---------------+---------+-----------+----------+-------------------+ FV DistalFull           Yes      Yes                                      +---------+---------------+---------+-----------+----------+-------------------+ PFV      Full                                                             +---------+---------------+---------+-----------+----------+-------------------+ POP      Full           Yes      Yes                                      +---------+---------------+---------+-----------+----------+-------------------+ PTV      Full                                         Not well visualized +---------+---------------+---------+-----------+----------+-------------------+ PERO     Full                                         Not well visualized +---------+---------------+---------+-----------+----------+-------------------+   +----+---------------+---------+-----------+----------+--------------+ LEFTCompressibilityPhasicitySpontaneityPropertiesThrombus Aging +----+---------------+---------+-----------+----------+--------------+ CFV Full           Yes      Yes                                 +----+---------------+---------+-----------+----------+--------------+     Summary: RIGHT: - There is no evidence of deep vein thrombosis in the lower extremity.  - No cystic structure found in the popliteal fossa. subcutaneous edema seen in calf and ankle.  LEFT: - No evidence of common femoral vein obstruction.   *See table(s) above for measurements and observations. Electronically signed by Genny Kid MD on 04/10/2024 at 8:08:03  PM.    Final    ECHOCARDIOGRAM COMPLETE Result Date: 04/09/2024    ECHOCARDIOGRAM REPORT   Patient Name:   Eric [name] Date of Exam: 04/09/2024 Medical Rec #:  [id number]  Height: Accession #:    [id number] Weight: Date of Birth:  [birth year]   BSA: Patient Age:    89 years   BP:           139/86 mmHg Patient Gender: M          HR:           64 bpm. Exam Location:  Inpatient Procedure: 2D Echo, Color Doppler and Cardiac Doppler (Both Spectral and Color            Flow  Doppler were utilized during procedure). Indications:    Acute CHF  History:        Patient has prior history of Echocardiogram examinations, most                 recent 09/15/2023. CHF, Arrythmias:Atrial Fibrillation; Risk                 Factors:Hypertension.  Sonographer:    Janette Medley Referring Phys: [id number] Angelene Kelly  Sonographer Comments: Technically difficult study due to poor echo windows. IMPRESSIONS  1. Left ventricular ejection fraction, by estimation, is 55 to 60%. The left ventricle has normal function. The left ventricle has no regional wall motion abnormalities. There is mild concentric left ventricular hypertrophy. Left ventricular diastolic function could not be evaluated.  2. Right ventricular systolic function is normal. The right ventricular size is normal. There is mildly elevated pulmonary artery systolic pressure.  3. Left atrial size was severely dilated.  4. Right atrial size was severely dilated.  5. The mitral valve is grossly normal. Mild to moderate mitral valve regurgitation. No evidence of mitral stenosis.  6. The aortic valve is tricuspid. There is mild calcification of the aortic valve. Aortic valve regurgitation is mild. Aortic valve sclerosis/calcification is present, without any evidence of aortic stenosis.  7. The inferior vena cava is dilated in size with <50% respiratory variability, suggesting right atrial pressure of 15 mmHg. Comparison(s): Prior images reviewed side by side. The right ventricular systolic  function has improved. FINDINGS  Left Ventricle: Left ventricular ejection fraction, by estimation, is 55 to 60%. The left ventricle has normal function. The left ventricle has no regional wall motion abnormalities. Definity  contrast agent was given IV to delineate the left ventricular  endocardial borders. The left ventricular internal cavity size was normal in size. There is mild concentric left ventricular hypertrophy. Left ventricular diastolic function could not be evaluated due to atrial fibrillation. Left ventricular diastolic function could not be evaluated. Right Ventricle: The right ventricular size is normal. Right vetricular wall thickness was not well visualized. Right ventricular systolic function is normal. There is mildly elevated pulmonary artery systolic pressure. The tricuspid regurgitant velocity  is 2.55 m/s, and with an assumed right atrial pressure of 15 mmHg, the estimated right ventricular systolic pressure is 41.0 mmHg. Left Atrium: Left atrial size was severely dilated. Right Atrium: Right atrial size was severely dilated. Pericardium: There is no evidence of pericardial effusion. Mitral Valve: The mitral valve is grossly normal. Mild to moderate mitral valve regurgitation, with posteriorly-directed jet. No evidence of mitral valve stenosis. Tricuspid Valve: The tricuspid valve is grossly normal. Tricuspid valve regurgitation is trivial. Aortic Valve: The aortic valve is tricuspid. There is mild calcification of the aortic valve. Aortic valve regurgitation is mild. Aortic valve sclerosis/calcification is present, without any evidence of aortic stenosis. Pulmonic Valve: The pulmonic valve was grossly normal. Pulmonic valve regurgitation is mild. No evidence of pulmonic stenosis. Aorta: The aortic root and ascending aorta are structurally normal, with no evidence of dilitation. Venous: The inferior vena cava is dilated in size with less than 50% respiratory variability, suggesting right  atrial pressure of 15 mmHg. IAS/Shunts: The interatrial septum was not well visualized.  LEFT VENTRICLE PLAX 2D LVIDd:         4.60 cm LVIDs:         2.60 cm LV PW:         1.20 cm LV IVS:        1.20 cm  LVOT diam:     2.10 cm LVOT Area:     3.46 cm   AORTA Ao Root diam: 2.80 cm Ao Desc diam: 3.30 cm MV E velocity: 90.70 cm/s    TRICUSPID VALVE MV A velocity: 2910.00 cm/s  TR Peak grad:   26.0 mmHg MV E/A ratio:  0.03          TR Vmax:        255.00 cm/s                               SHUNTS                              Systemic Diam: 2.10 cm Karyl Paget Croitoru MD Electronically signed by Luana Rumple MD Signature Date/Time: 04/09/2024/2:27:58 PM    Final    CT Angio Chest PE W and/or Wo Contrast Result Date: 04/08/2024 EXAM: CTA of the Chest without and with IV contrast for PE 04/08/2024 11:21:27 PM TECHNIQUE: CTA of the chest was performed after the administration of intravenous contrast. Multiplanar reformatted images are provided for review. MIP images are provided for review. Automated exposure control, iterative reconstruction, and/or weight based adjustment of the mA/kV was utilized to reduce the radiation dose to as low as reasonably achievable. COMPARISON: Chest radiograph earlier today and CT chest dated 09/15/2023. CLINICAL HISTORY: Pulmonary embolism (PE) suspected, high probability. Patient went to heart MD and was noted to be hypoxic and bradycardic. Patient reports increased shortness of breath and bradycardia with PVC. Patient has bilateral leg swelling that is weeping. Contrast administered: 75mL (iohexol  (OMNIPAQUE ) 350 MG/ML injection 75 mL IOHEXOL  350 MG/ML SOLN). FINDINGS: PULMONARY ARTERIES: Pulmonary arteries are adequately opacified for evaluation. No evidence of pulmonary embolism. Main pulmonary artery is normal in caliber. MEDIASTINUM: No evidence of mediastinal lymphadenopathy. There is no acute abnormality of the thoracic aorta. Cardiomegaly. Atherosclerotic calcification of the aortic  arch. Moderate coronary arthrosis of the LAD. LYMPH NODES: No evidence of mediastinal, hilar or axillary lymphadenopathy. LUNGS AND PLEURA: Evaluation of the lung parenchyma is constrained by respiratory motion. Scattered dependent patchy opacities, favoring atelectasis. Small right and trace left pleural effusions. UPPER ABDOMEN: Calcified splenic granulomata. Status post cholecystectomy. SOFT TISSUES AND BONES: No acute bone or soft tissue abnormality. IMPRESSION: 1. No evidence of pulmonary embolism. 2. Cardiomegaly. Small right and trace left pleural effusions. 3. Scattered dependent patchy opacities, favoring atelectasis. Electronically signed by: Zadie Herter MD 04/08/2024 11:25 PM EDT RP Workstation: [id number]   DG Chest Port 1 View Result Date: 04/08/2024 CLINICAL DATA:  Shortness of breath. EXAM: PORTABLE CHEST 1 VIEW COMPARISON:  Chest radiograph dated 12/15/2023. FINDINGS: No focal consolidation, pleural effusion, pneumothorax. Mild cardiomegaly. Degenerative changes of the spine. No acute osseous pathology. IMPRESSION: 1. No active disease. 2. Mild cardiomegaly. Electronically Signed   By: Angus Bark M.D.   On: 04/08/2024 18:55    Microbiology: Results for orders placed or performed during the hospital encounter of 09/14/23  Resp panel by RT-PCR (RSV, Flu A&B, Covid) Anterior Nasal Swab     Status: None   Collection Time: 09/14/23  4:07 PM   Specimen: Anterior Nasal Swab  Result Value Ref Range Status   SARS Coronavirus 2 by RT PCR NEGATIVE NEGATIVE Final   Influenza A by PCR NEGATIVE NEGATIVE Final   Influenza B by PCR NEGATIVE NEGATIVE Final    Comment: (NOTE) The Xpert  Xpress SARS-CoV-2/FLU/RSV plus assay is intended as an aid in the diagnosis of influenza from Nasopharyngeal swab specimens and should not be used as a sole basis for treatment. Nasal washings and aspirates are unacceptable for Xpert Xpress SARS-CoV-2/FLU/RSV testing.  Fact Sheet for  Patients: BloggerCourse.com  Fact Sheet for Healthcare Providers: SeriousBroker.it  This test is not yet approved or cleared by the [place]  FDA and has been authorized for detection and/or diagnosis of SARS-CoV-2 by FDA under an Emergency Use Authorization (EUA). This EUA will remain in effect (meaning this test can be used) for the duration of the COVID-19 declaration under Section 564(b)(1) of the Act, 21 U.S.C. section 360bbb-3(b)(1), unless the authorization is terminated or revoked.     Resp Syncytial Virus by PCR NEGATIVE NEGATIVE Final    Comment: (NOTE) Fact Sheet for Patients: BloggerCourse.com  Fact Sheet for Healthcare Providers: SeriousBroker.it  This test is not yet approved or cleared by the [place]  FDA and has been authorized for detection and/or diagnosis of SARS-CoV-2 by FDA under an Emergency Use Authorization (EUA). This EUA will remain in effect (meaning this test can be used) for the duration of the COVID-19 declaration under Section 564(b)(1) of the Act, 21 U.S.C. section 360bbb-3(b)(1), unless the authorization is terminated or revoked.  Performed at [name], [address], [place], [place] [address]     Labs: CBC: Recent Labs  [name] 04/08/24 1749 04/09/24 0508  WBC 6.5 5.8  NEUTROABS 3.7 3.5  HGB 14.4 13.8  HCT 46.4 44.4  MCV 96.7 95.7  PLT 209 152   Basic Metabolic Panel: Recent Labs  [name] 04/08/24 2042 04/09/24 0508 04/10/24 0227 04/11/24 0228 04/12/24 0130  NA 141 140 139 137 138  K 4.1 3.8 4.0 4.1 3.8  CL 106 102 98 97* 101  CO2 28 30 29 29 26   GLUCOSE 88 98 133* 86 107*  BUN 26* 24* 28* 37* 38*  CREATININE 1.12 1.24 1.43* 1.47* 1.27*  CALCIUM 8.7* 8.9 9.0 8.8* 8.7*   Liver Function Tests: Recent Labs  [name] 04/09/24 0508  AST 41  ALT 34  ALKPHOS 65  BILITOT 2.3*  PROT 6.9  ALBUMIN 3.6   CBG: No  results for input(s): "GLUCAP" in the last 168 hours.  Discharge time spent: greater than 30 minutes.  Signed: Albertus Alt, MD [facility] 04/12/2024

## 2024-04-12 NOTE — Progress Notes (Signed)
 Patient Name: Eric [name] Date of Encounter: 04/12/2024 [facility] Cardiologist: Belva Boyden, MD   Interval Summary  .    Patient reports poor sleep, says that his oxygen  was not put on last night. His primary complaint continues to be persistent dry cough. Denies any chest pain.  Vital Signs .    Vitals:   04/11/24 1520 04/11/24 2014 04/12/24 0011 04/12/24 0440  BP: 106/60 127/79 124/60 130/79  Pulse: 71 88 60 60  Resp: 20 18 20 20   Temp: 97.7 F (36.5 C) 98.1 F (36.7 C) 97.6 F (36.4 C) 97.6 F (36.4 C)  TempSrc: Oral Oral Oral Oral  SpO2: (!) 62% 93% 90% 90%  Weight:    81.5 kg  Height:        Intake/Output Summary (Last 24 hours) at 04/12/2024 0759 Last data filed at 04/11/2024 2000 Gross per 24 hour  Intake 720 ml  Output 1050 ml  Net -330 ml      04/12/2024    4:40 AM 04/11/2024    3:50 AM 04/10/2024    4:46 AM  Last 3 Weights  Weight (lbs) 179 lb 10.8 oz 180 lb 181 lb 14.1 oz  Weight (kg) 81.5 kg 81.647 kg 82.5 kg      Telemetry/ECG    Atrial fibrillation, intermittent PVCs. Bradycardia briefly down to 30bpm this morning around 0745 - Personally Reviewed  Physical Exam .   GEN: No acute distress.   Neck: No JVD Cardiac: irregularly irregular, no murmurs, rubs, or gallops.  Respiratory: bibasilar end-expiratory wheezing with rhonchi GI: Soft, nontender, non-distended  MS: No edema  Assessment & Plan .     89 year old male with persistent afib, PVCs, prior tobacco use, dilated ascending aorta, HFpEF, COPD with chronic O2 dependent respiratory failure, hyperlipidemia, hypertension, hypothyroidism. Patient seen in clinic on 04/28 and sent to the ED for management of worsening dyspnea.   Acute on chronic HFpEF Mild elevation of PA systolic pressure Patient admitted with chronic lower extremity edema and worsening dyspnea with hypoxia. BNP found to be 256. Chest CT with small bilateral pleural effusions. TTE with LVEF 55-60%, indeterminate DD,  normal RV function. Patient diuresed aggressively on admission, 2.75 L output on 04/29 with reported weight decrease of 10lbs. With this significant output, he was noted with creatinine elevation 1.24->1.43 and lasix  held 04/30. Creatinine improved today 1.47->1.27. BUN stable 37->38. On exam, patient still appears euvolemic and I do not appreciate any pulmonary crackles consistent with fluid. Recommend resuming PO Lasix  40mg  with PRN extra dose for weight gain. Continue to suspect that COPD is a primary factor in his acute on chronic dyspnea.  With $0 cost for either Jardiance  or Elvina Hammers, will start SGLT2 today. Patient would also benefit from MRA given stable BP and improving renal function. 12.5mg  spironolactone  ordered. Close outpatient follow up has been arranged.   Permanent atrial fibrillation Slow ventricular response Secondary hypercoagulable state Patient with bradycardia noted this admission, HR as low as 30s. This was also noted prior to this admission prompting his PCP to hold Lopressor . Of note, patient previously on Amiodarone , stopped in 2021 due to thyroid  disease. Patient with a brief episode of bradycardia with HR down to 30bpm this morning around 0745. No symptoms per patient. Question if this could have in part been a result of not being on O2 with possible vasovagal component with coughing spell. Continue to hold beta blocker Continue Eliquis    Hypertension Low normal to normal BP this admission.  As above, avoiding beta blockers with slow ventricular response.    Acute on chronic hypoxic respiratory failure COPD Patient currently on home O2 flow. Suspect that a degree of the dyspnea prompting this admission could have been due to COPD exacerbation. COPD inhaler regimen per primary team   For questions or updates, please contact [facility] Please consult [email or web address] for contact info under        Signed, Leala Prince, PA-C

## 2024-04-12 NOTE — Progress Notes (Signed)
 Occupational Therapy Treatment Patient Details Name: Akil Ryba Galati MRN: [id number] DOB: 11/21/1932 Today's Date: 04/12/2024   History of present illness Patient is an 89 y/o male admitted 04/08/24 with SOB found to have bilateral pleural effusion with wheeze and elevated BNP.  Admitted for acute CHF with COPD exacerbation.  PMH positive for CHR, COPD, arthritis, cervical DDD, dysphagia, h/o DVT, HTN, HLD, ischemic colitis, PAF, SCC of skin, PNA and L foot fracture.   OT comments  Pt received supine and agreeable to session. Pt completing bed mobility to get EOB with CGA. STS performed 3x with CGA throughout. Once up, pt requesting to use bathroom, able to ambulate to toilet with RW and complete BM, pt also completing hygiene with no need for assist. Pt completed self care and ADLs at sink while seated. Returned to [employer] and maxA provided to don new socks. Left with all needs met. Acute OT to continue to follow to address established goals to facilitate DC to next venue of care.        If plan is discharge home, recommend the following:  A little help with walking and/or transfers;A little help with bathing/dressing/bathroom;Assistance with cooking/housework;Assist for transportation;Help with stairs or ramp for entrance   Equipment Recommendations  None recommended by OT    Recommendations for Other Services      Precautions / Restrictions Precautions Precautions: Fall Recall of Precautions/Restrictions: Intact Restrictions Weight Bearing Restrictions Per Provider Order: No       Mobility Bed Mobility Overal bed mobility: Modified Independent Bed Mobility: Supine to Sit     Supine to sit: Contact guard     General bed mobility comments: CGA for safety    Transfers Overall transfer level: Needs assistance Equipment used: Rolling walker (2 wheels) Transfers: Sit to/from Stand Sit to Stand: Contact guard assist           General transfer comment: Light CGA for safety,  able to power up but demos anterior preference     Balance Overall balance assessment: Needs assistance Sitting-balance support: No upper extremity supported, Feet supported Sitting balance-Leahy Scale: Fair Sitting balance - Comments: EOB and in chair   Standing balance support: Bilateral upper extremity supported Standing balance-Leahy Scale: Poor Standing balance comment: reliant on RW for support                           ADL either performed or assessed with clinical judgement   ADL Overall ADL's : Needs assistance/impaired     Grooming: Wash/dry hands;Wash/dry face;Oral care;Supervision/safety;Sitting Grooming Details (indicate cue type and reason): seated at sink, supervision for safety             Lower Body Dressing: Maximal assistance Lower Body Dressing Details (indicate cue type and reason): don socks Toilet Transfer: Contact guard assist;Ambulation;Rolling walker (2 wheels) Toilet Transfer Details (indicate cue type and reason): raised toilet in bathroom Toileting- Clothing Manipulation and Hygiene: Supervision/safety;Sit to/from stand Toileting - Clothing Manipulation Details (indicate cue type and reason): able to manage clothing and complete hygiene     Functional mobility during ADLs: Contact guard assist;Supervision/safety;Rolling walker (2 wheels) General ADL Comments: pt requires increased time to complete tasks and mobility    Extremity/Trunk Assessment              Vision       Perception     Praxis     Communication Communication Communication: Impaired Factors Affecting Communication: Hearing impaired   Cognition Arousal:  Alert Behavior During Therapy: WFL for tasks assessed/performed Cognition: No apparent impairments                               Following commands: Intact        Cueing   Cueing Techniques: Verbal cues  Exercises      Shoulder Instructions       General Comments VSS on RA     Pertinent Vitals/ Pain       Pain Assessment Pain Assessment: No/denies pain Pain Intervention(s): Monitored during session  Home Living                                          Prior Functioning/Environment              Frequency  Min 2X/week        Progress Toward Goals  OT Goals(current goals can now be found in the care plan section)  Progress towards OT goals: Progressing toward goals  Acute Rehab OT Goals OT Goal Formulation: With patient Time For Goal Achievement: 04/18/24 Potential to Achieve Goals: Good ADL Goals Pt Will Perform Grooming: with modified independence;standing Pt Will Perform Lower Body Bathing: with modified independence;sit to/from stand Pt Will Perform Lower Body Dressing: with modified independence;sit to/from stand Pt Will Transfer to Toilet: with modified independence;ambulating Pt Will Perform Toileting - Clothing Manipulation and hygiene: with modified independence;sit to/from stand  Plan      Co-evaluation                 AM-PAC OT "6 Clicks" Daily Activity     Outcome Measure   Help from another person eating meals?: None Help from another person taking care of personal grooming?: A Little Help from another person toileting, which includes using toliet, bedpan, or urinal?: A Little Help from another person bathing (including washing, rinsing, drying)?: A Little Help from another person to put on and taking off regular upper body clothing?: A Little Help from another person to put on and taking off regular lower body clothing?: A Little 6 Click Score: 19    End of Session Equipment Utilized During Treatment: Gait belt;Rolling walker (2 wheels)  OT Visit Diagnosis: Unsteadiness on feet (R26.81);Other abnormalities of gait and mobility (R26.89);Muscle weakness (generalized) (M62.81)   Activity Tolerance Patient tolerated treatment well   Patient Left in chair;with call bell/phone within reach    Nurse Communication Mobility status        Time: 4332-9518 OT Time Calculation (min): 35 min  Charges: OT General Charges $OT Visit: 1 Visit OT Treatments $Self Care/Home Management : 23-37 mins  Virginio Isidore, BS, OTA/S   Daysean Tinkham 04/12/2024, 12:38 PM

## 2024-04-12 NOTE — Telephone Encounter (Addendum)
 Patient Product/process development scientist completed.    The patient is insured through [employer]/ [employer]. Patient has Medicare and is not eligible for a copay card, but may be able to apply for patient assistance or Medicare RX Payment Plan (Patient Must reach out to their plan, if eligible for payment plan), if available.    Ran test claim for Farxiga 10 mg and the current 30 day co-pay is $0.00.  Ran test claim for Jardiance 10 mg and the current 30 day co-pay is $0.00.  This test claim was processed through [facility]- copay amounts may vary at other pharmacies due to pharmacy/plan contracts, or as the patient moves through the different stages of their insurance plan.     Roland Earl, CPHT Pharmacy Technician III Certified Patient Advocate [facility] Patient Advocate Team Direct Number: [phone]  Fax: [phone]

## 2024-04-16 ENCOUNTER — Telehealth: Payer: Self-pay

## 2024-04-16 DIAGNOSIS — D510 Vitamin B12 deficiency anemia due to intrinsic factor deficiency: Secondary | ICD-10-CM | POA: Diagnosis not present

## 2024-04-16 DIAGNOSIS — M1990 Unspecified osteoarthritis, unspecified site: Secondary | ICD-10-CM | POA: Diagnosis not present

## 2024-04-16 DIAGNOSIS — I480 Paroxysmal atrial fibrillation: Secondary | ICD-10-CM | POA: Diagnosis not present

## 2024-04-16 DIAGNOSIS — M5030 Other cervical disc degeneration, unspecified cervical region: Secondary | ICD-10-CM | POA: Diagnosis not present

## 2024-04-16 DIAGNOSIS — J441 Chronic obstructive pulmonary disease with (acute) exacerbation: Secondary | ICD-10-CM | POA: Diagnosis not present

## 2024-04-16 DIAGNOSIS — G459 Transient cerebral ischemic attack, unspecified: Secondary | ICD-10-CM | POA: Diagnosis not present

## 2024-04-16 DIAGNOSIS — F329 Major depressive disorder, single episode, unspecified: Secondary | ICD-10-CM | POA: Diagnosis not present

## 2024-04-16 DIAGNOSIS — I700 Atherosclerosis of aorta: Secondary | ICD-10-CM | POA: Diagnosis not present

## 2024-04-16 DIAGNOSIS — G959 Disease of spinal cord, unspecified: Secondary | ICD-10-CM | POA: Diagnosis not present

## 2024-04-16 DIAGNOSIS — D6869 Other thrombophilia: Secondary | ICD-10-CM | POA: Diagnosis not present

## 2024-04-16 DIAGNOSIS — F419 Anxiety disorder, unspecified: Secondary | ICD-10-CM | POA: Diagnosis not present

## 2024-04-16 DIAGNOSIS — M8580 Other specified disorders of bone density and structure, unspecified site: Secondary | ICD-10-CM | POA: Diagnosis not present

## 2024-04-16 DIAGNOSIS — M797 Fibromyalgia: Secondary | ICD-10-CM | POA: Diagnosis not present

## 2024-04-16 DIAGNOSIS — I5033 Acute on chronic diastolic (congestive) heart failure: Secondary | ICD-10-CM | POA: Diagnosis not present

## 2024-04-16 DIAGNOSIS — I130 Hypertensive heart and chronic kidney disease with heart failure and stage 1 through stage 4 chronic kidney disease, or unspecified chronic kidney disease: Secondary | ICD-10-CM | POA: Diagnosis not present

## 2024-04-16 DIAGNOSIS — E559 Vitamin D deficiency, unspecified: Secondary | ICD-10-CM | POA: Diagnosis not present

## 2024-04-16 DIAGNOSIS — E440 Moderate protein-calorie malnutrition: Secondary | ICD-10-CM | POA: Diagnosis not present

## 2024-04-16 DIAGNOSIS — J9601 Acute respiratory failure with hypoxia: Secondary | ICD-10-CM | POA: Diagnosis not present

## 2024-04-16 DIAGNOSIS — M4802 Spinal stenosis, cervical region: Secondary | ICD-10-CM | POA: Diagnosis not present

## 2024-04-16 DIAGNOSIS — E782 Mixed hyperlipidemia: Secondary | ICD-10-CM | POA: Diagnosis not present

## 2024-04-16 DIAGNOSIS — N1831 Chronic kidney disease, stage 3a: Secondary | ICD-10-CM | POA: Diagnosis not present

## 2024-04-16 DIAGNOSIS — K222 Esophageal obstruction: Secondary | ICD-10-CM | POA: Diagnosis not present

## 2024-04-16 DIAGNOSIS — J8410 Pulmonary fibrosis, unspecified: Secondary | ICD-10-CM | POA: Diagnosis not present

## 2024-04-16 DIAGNOSIS — D500 Iron deficiency anemia secondary to blood loss (chronic): Secondary | ICD-10-CM | POA: Diagnosis not present

## 2024-04-16 NOTE — Telephone Encounter (Signed)
 Copied from CRM [id number]. Topic: General - Other >> Bega 6, 2025  3:30 PM Blair Bumpers wrote: Reason for CRM: Virginia , nurse with [facility], called in stating they just started patient with home health today after receiving a referral. She wants to know if Dr. Roselie Conger will be able to sign those orders? Please give her a call back at [phone].

## 2024-04-16 NOTE — Telephone Encounter (Signed)
Message routed to PCP Eubanks, Jessica K, NP  

## 2024-04-16 NOTE — Telephone Encounter (Signed)
 Yes Gilbert Lab, NP will sign to keep follow up appt as scheduled

## 2024-04-18 ENCOUNTER — Telehealth: Payer: Self-pay

## 2024-04-18 NOTE — Telephone Encounter (Signed)
 I called and confirmed this with Virginia . She states that she will send this through.

## 2024-04-18 NOTE — Telephone Encounter (Signed)
 Noted thank you

## 2024-04-18 NOTE — Telephone Encounter (Signed)
 She also added that patient plans on keeping his upcoming appointment as well.

## 2024-04-18 NOTE — Telephone Encounter (Signed)
 Copied from CRM [id number]. Topic: Clinical - Home Health Verbal Orders >> Menning 8, 2025 11:57 AM Maryln Sober wrote: Caller/Agency: Marita Sidle [facility] Callback Number: [phone] Service Requested: Physical Therapy Frequency: 1 week 9 Any new concerns about the patient? No  Left a very detail voicemail on a secure line to give Home Health Verbal Orders for patient to have physical Therapy FYI through [facility] Policy.  Verma Gobble, NP has been notified  Message sent to Verma Gobble, NP

## 2024-04-24 ENCOUNTER — Encounter: Payer: Self-pay | Admitting: Medical

## 2024-04-24 ENCOUNTER — Ambulatory Visit: Attending: Medical | Admitting: Medical

## 2024-04-24 VITALS — BP 116/70 | HR 55 | Ht 66.0 in | Wt 173.2 lb

## 2024-04-24 DIAGNOSIS — I5032 Chronic diastolic (congestive) heart failure: Secondary | ICD-10-CM | POA: Diagnosis not present

## 2024-04-24 DIAGNOSIS — J9611 Chronic respiratory failure with hypoxia: Secondary | ICD-10-CM

## 2024-04-24 DIAGNOSIS — I10 Essential (primary) hypertension: Secondary | ICD-10-CM | POA: Diagnosis not present

## 2024-04-24 DIAGNOSIS — I4821 Permanent atrial fibrillation: Secondary | ICD-10-CM

## 2024-04-24 DIAGNOSIS — J441 Chronic obstructive pulmonary disease with (acute) exacerbation: Secondary | ICD-10-CM

## 2024-04-24 DIAGNOSIS — N1832 Chronic kidney disease, stage 3b: Secondary | ICD-10-CM

## 2024-04-24 NOTE — Patient Instructions (Signed)
 Medication Instructions:  Your Physician recommend you continue on your current medication as directed.    *If you need a refill on your cardiac medications before your next appointment, please call your pharmacy*  Lab Work: Your provider would like for you to have following labs drawn today BMP.   If you have labs (blood work) drawn today and your tests are completely normal, you will receive your results only by: MyChart Message (if you have MyChart) OR A paper copy in the mail If you have any lab test that is abnormal or we need to change your treatment, we will call you to review the results.  Testing/Procedures: No test ordered today   Follow-Up: At [facility], you and your health needs are our priority.  As part of our continuing mission to provide you with exceptional heart care, our providers are all part of one team.  This team includes your primary Cardiologist (physician) and Advanced Practice Providers or APPs (Physician Assistants and Nurse Practitioners) who all work together to provide you with the care you need, when you need it.  Your next appointment:   3 month(s)  Provider:   Timothy Gollan, MD or Cadence Gennaro Khat, PA-C

## 2024-04-24 NOTE — Progress Notes (Signed)
 Cardiology Office Note:  .   Date:  04/24/2024  ID:  Eric [name], DOB 1932/10/19, MRN [id number] PCP: Verma Gobble, NP  [facility] Providers Cardiologist:  Belva Boyden, MD {  History of Present Illness: .   Eric [name] is a 89 y.o. male with a h/o permanent Afib, PVCs, remote smoking, dilated ascending aorta, chornic HFpEF, chronic respiratory distress/COPD, HLD, HTN, hypothyroidism who presents for hospital follow-up.    The patient has a h/o Afib with cardioversion 06/2017. He had recurrent Afib 08/2021 off amiodarone  due to thyroid  disease. Medical management was recommended. He is on Eliquis .    Echo in 2018 showed EF of 55 to 60%.  Myoview  Lexiscan  showed no significant ischemia, overall low risk.  Echo in November 2018 showed EF of 60 to 65%, grade 2 diastolic dysfunction, normal wall motion.  Heart monitor for syncope in 12/2017 showed normal sinus rhythm, no significant arrhythmia, rare PACs and PVCs.  Heart monitor in October 2019 showed normal sinus rhythm, second-degree AV block-Mobitz type I, rare ectopy.  Patient triggered events not associated with arrhythmia.  Echo in 2024 showed normal LVEF, severely dilated left atrium, severely dilated right atrium, mild MR.   Patient was last seen 04/08/24 reporting lower leg edema, low HR and lower leg edema. EKG showed Afib HR 52bpm with PVCs. IT was recommended he go to the ER.  The patient went to the ER and was admitted for shortness of breath suspected from COPD and acute on chronic diastolic heart failure.  BNP 256.  Chest CT with small bilateral pleural effusions.  Echo showed EF 55 to 60%.  He was treated with IV Lasix . He had brief AKI. Patient was discharged on Lasix  40 mg daily.  Today, the patient says he lost 194>173lbs with diuresis. The patient is feeling better. Patient feels breathing is better. No chest pain. He is taking lasix  40mg  daily. He is using 4L O2 at night.    Studies Reviewed: .        Echo  2024-04-28 1. Left ventricular ejection fraction, by estimation, is 55 to 60%. The  left ventricle has normal function. The left ventricle has no regional  wall motion abnormalities. There is mild concentric left ventricular  hypertrophy. Left ventricular diastolic  function could not be evaluated.   2. Right ventricular systolic function is normal. The right ventricular  size is normal. There is mildly elevated pulmonary artery systolic  pressure.   3. Left atrial size was severely dilated.   4. Right atrial size was severely dilated.   5. The mitral valve is grossly normal. Mild to moderate mitral valve  regurgitation. No evidence of mitral stenosis.   6. The aortic valve is tricuspid. There is mild calcification of the  aortic valve. Aortic valve regurgitation is mild. Aortic valve  sclerosis/calcification is present, without any evidence of aortic  stenosis.   7. The inferior vena cava is dilated in size with <50% respiratory  variability, suggesting right atrial pressure of 15 mmHg.   Comparison(s): Prior images reviewed side by side. The right ventricular  systolic function has improved.   Echo 09/2023  1. Left ventricular ejection fraction, by estimation, is 60 to 65%. The  left ventricle has normal function. The left ventricle has no regional  wall motion abnormalities. Left ventricular diastolic parameters are  indeterminate.   2. Right ventricular systolic function is moderately reduced. The right  ventricular size is moderately enlarged. There is mildly elevated  pulmonary  artery systolic pressure.   3. Left atrial size was severely dilated.   4. Right atrial size was severely dilated.   5. The mitral valve is normal in structure. Mild mitral valve  regurgitation. No evidence of mitral stenosis.   6. The aortic valve is normal in structure. Aortic valve regurgitation is  trivial. No aortic stenosis is present.   7. The inferior vena cava is dilated in size with <50%  respiratory  variability, suggesting right atrial pressure of 15 mmHg.   Echo 2018  1. Left ventricular ejection fraction, by estimation, is 60 to 65%. The  left ventricle has normal function. The left ventricle has no regional  wall motion abnormalities. Left ventricular diastolic parameters are  indeterminate.   2. Right ventricular systolic function is moderately reduced. The right  ventricular size is moderately enlarged. There is mildly elevated  pulmonary artery systolic pressure.   3. Left atrial size was severely dilated.   4. Right atrial size was severely dilated.   5. The mitral valve is normal in structure. Mild mitral valve  regurgitation. No evidence of mitral stenosis.   6. The aortic valve is normal in structure. Aortic valve regurgitation is  trivial. No aortic stenosis is present.   7. The inferior vena cava is dilated in size with <50% respiratory  variability, suggesting right atrial pressure of 15 mmHg.    Myoview  lexiscan  2018 Narrative & Impression  Pharmacological myocardial perfusion imaging study with no significant  ischemia Significant GI uptake artifact  Normal wall motion, EF estimated at 79% No EKG changes concerning for ischemia at peak stress or in recovery. Rhythm is atrial fibrillation, rare PVCs Low risk scan    Physical Exam:   VS:  BP 116/70 (BP Location: Left Arm)   Pulse (!) 55   Ht 5\' 6"  (1.676 m)   Wt 173 lb 3.2 oz (78.6 kg)   SpO2 (!) 89%   BMI 27.96 kg/m    Wt Readings from Last 3 Encounters:  04/24/24 173 lb 3.2 oz (78.6 kg)  04/12/24 179 lb 10.8 oz (81.5 kg)  04/08/24 191 lb 12.8 oz (87 kg)    GEN: Well nourished, well developed in no acute distress NECK: No JVD; No carotid bruits CARDIAC: RRR, no murmurs, rubs, gallops RESPIRATORY:  Clear to auscultation without rales, wheezing or rhonchi  ABDOMEN: Soft, non-tender, non-distended EXTREMITIES:  No edema; No deformity   ASSESSMENT AND PLAN: .    Chronic HFpEF Recent  admission for acute on chronic HFpEF treated with IV Lasix  complicated by brief AKI.  Echo showed LVEF 55 to 60%, mild LVH, severely dilated by atrium, mild to moderate MR, mild calcification of the aortic valve.  Patient's weight down 193 to 171 pounds.  Patient is taking Lasix  20 mg daily.  He has trace lower leg edema worse on the right.  Patient is taking his weights every day and eating a low-salt diet.  Continue Jardiance  10 mg daily and spironolactone  12.5mg  daily. BMET today.  Permanent A-fib with slow ventricular response Previously on amiodarone , stopped in 2022 due to thyroid  disease, and has been permanent A-fib since then.  Continue Eliquis  for stroke prophylaxis.  CKD stage 3 Scr 1.25 on d/c. BMET today as above.   COPD Chronic respiratory failure The patient uses 4 L of O2 at night.       Dispo: Follow-up in 3 months  Signed, Tanesha Arambula Rebekah Canada, PA-C

## 2024-04-25 ENCOUNTER — Ambulatory Visit: Payer: Self-pay | Admitting: Medical

## 2024-04-25 LAB — BASIC METABOLIC PANEL WITH GFR
BUN/Creatinine Ratio: 23 (ref 10–24)
BUN: 29 mg/dL — ABNORMAL HIGH (ref 8–27)
CO2: 22 mmol/L (ref 20–29)
Calcium: 9.7 mg/dL (ref 8.6–10.2)
Chloride: 106 mmol/L (ref 96–106)
Creatinine, Ser: 1.24 mg/dL (ref 0.76–1.27)
Glucose: 86 mg/dL (ref 70–99)
Potassium: 4.7 mmol/L (ref 3.5–5.2)
Sodium: 143 mmol/L (ref 134–144)
eGFR: 56 mL/min/{1.73_m2} — ABNORMAL LOW (ref >59)

## 2024-05-01 ENCOUNTER — Ambulatory Visit (INDEPENDENT_AMBULATORY_CARE_PROVIDER_SITE_OTHER): Admitting: Nurse Practitioner

## 2024-05-01 ENCOUNTER — Encounter: Payer: Self-pay | Admitting: Nurse Practitioner

## 2024-05-01 VITALS — BP 118/60 | HR 70 | Temp 98.6°F | Resp 20 | Ht 66.0 in | Wt 175.0 lb

## 2024-05-01 DIAGNOSIS — E039 Hypothyroidism, unspecified: Secondary | ICD-10-CM | POA: Diagnosis not present

## 2024-05-01 DIAGNOSIS — I480 Paroxysmal atrial fibrillation: Secondary | ICD-10-CM

## 2024-05-01 DIAGNOSIS — J449 Chronic obstructive pulmonary disease, unspecified: Secondary | ICD-10-CM | POA: Diagnosis not present

## 2024-05-01 DIAGNOSIS — E782 Mixed hyperlipidemia: Secondary | ICD-10-CM

## 2024-05-01 DIAGNOSIS — I10 Essential (primary) hypertension: Secondary | ICD-10-CM

## 2024-05-01 DIAGNOSIS — I5032 Chronic diastolic (congestive) heart failure: Secondary | ICD-10-CM | POA: Diagnosis not present

## 2024-05-01 NOTE — Progress Notes (Signed)
 "   Careteam: Patient Care Team: Caro Harlene POUR, NP as PCP - General (Geriatric Medicine) Perla Evalene PARAS, MD as PCP - Cardiology (Cardiology) Perla Evalene PARAS, MD as Consulting Physician (Cardiology) Cesario Boer, MD as Attending Physician (Physical Medicine and Rehabilitation) Beuford Anes, MD as Consulting Physician (Orthopedic Surgery) Linard Alm NOVAK, MD (Inactive) as Consulting Physician (Pulmonary Disease)  PLACE OF SERVICE:  [facility]  Advanced Directive information    Allergies  Allergen Reactions   Morphine And Codeine Shortness Of Breath   Percocet [Oxycodone-Acetaminophen ] Shortness Of Breath   Valium Shortness Of Breath    Chief Complaint  Patient presents with   hospltal follow up    Patient has concerns about pain in both legs    HPI:  Discussed the use of AI scribe software for clinical note transcription with the patient, who gave verbal consent to proceed.  History of Present Illness Eric [name] is an 89 year old male with congestive heart failure and atrial fibrillation who presents for a hospital follow-up after fluid overload.  He was recently hospitalized due to fluid overload, resulting in a weight gain of approximately 20 pounds. He experienced worsening shortness of breath for two weeks prior to hospitalization, both at rest and with exertion, along with swelling in the lower legs.  During his hospital stay, his medication regimen was adjusted, including the discontinuation of metoprolol  due to bradycardia. He is now on furosemide , Jardiance , and spironolactone  to manage fluid retention. He monitors his weight daily, reporting a consistent weight of 171 pounds at home.  Hx of a fib but had bradycardia therefore metoprolol  was stopped. No palpitations noted and HR normal today.   He is currently receiving therapy and nursing visits at home once a week following his hospitalization for congestive heart failure. No abdominal pain and normal bowel  movements. He does not use compression hose. No current shortness of breath or palpitations. He has no concerns today.     Review of Systems:  Review of Systems  Constitutional:  Negative for chills, fever and weight loss.  HENT:  Negative for tinnitus.   Respiratory:  Negative for cough, sputum production and shortness of breath.   Cardiovascular:  Negative for chest pain, palpitations and leg swelling.  Gastrointestinal:  Negative for abdominal pain, constipation, diarrhea and heartburn.  Genitourinary:  Negative for dysuria, frequency and urgency.  Musculoskeletal:  Negative for back pain, joint pain and myalgias.  Skin: Negative.   Neurological:  Negative for dizziness and headaches.  Psychiatric/Behavioral:  Negative for depression and memory loss. The patient does not have insomnia.     Past Medical History:  Diagnosis Date   Anxiety    Arthritis    Atypical chest pain    a. 05/2017 MV: no ischemia, EF 79%.   Chest pain 10/20/2017   Chronic diastolic CHF (congestive heart failure) (HCC)    a. 01/2011 Echo: EF 50-55%, gr1 DD, mild AI, nl RV fxn, mild TR/PR; b. 10/2017 Echo: EF 60-65%, mild LVH, gr2 DD.   DDD (degenerative disc disease), cervical    Depressive disorder, not elsewhere classified    Difficult intubation    Dysphagia, oral phase    Dyspnea    Edema    Gallstones    a. Symptomatic - s/p lap chole 05/2018.   History of DVT (deep vein thrombosis)    History of kidney stones    Hypertension    Hypoxemia    Impacted cerumen    Ischemic colitis (HCC)  a. 02/2018 GIB - colonoscopy w/ isch colitis. Anticoagulation resumed.   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 a. 01/2011 in  setting of post-op complications including aspiration pna;  b. CHA2DS2VASc = 4--> Amio/Eliquis .   Pneumonia 03/06/2003   Spinal stenosis, unspecified region other than cervical    Squamous cell carcinoma of skin of trunk, except scrotum    skin cancer of shoulder   Syncope    a. 10/2017-->Event monitor: RSR, rare PACs/PVCs.   Thoracic or lumbosacral neuritis or radiculitis, unspecified    Past Surgical History:  Procedure Laterality Date   CARDIOVERSION N/A 06/20/2017   Procedure: CARDIOVERSION;  Surgeon: Perla Evalene PARAS, MD;  Location: [facility] ORS;  Service: Cardiovascular;  Laterality: N/A;   CATARACT EXTRACTION W/ INTRAOCULAR LENS  IMPLANT, BILATERAL     CERVICAL FUSION  02/10/2011   CHOLECYSTECTOMY  05/24/2018   CHOLECYSTECTOMY N/A 05/24/2018   Procedure: LAPAROSCOPIC CHOLECYSTECTOMY;  Surgeon: Vernetta Berg, MD;  Location: [facility] OR;  Service: General;  Laterality: N/A;   COLONOSCOPY WITH PROPOFOL  N/A 02/20/2018   Procedure: COLONOSCOPY WITH PROPOFOL ;  Surgeon: Jinny Carmine, MD;  Location: [facility] ENDOSCOPY;  Service: Endoscopy;  Laterality: N/A;   history of abd ultrasound  11/01   fatty liver   MULTIPLE TOOTH EXTRACTIONS     ORIF FIBULA FRACTURE Left 01/06/2017   Procedure: OPEN REDUCTION INTERNAL FIXATION (ORIF) FIBULA FRACTURE DISTAL FIBULA;  Surgeon: Maude Herald, MD;  Location: [facility] OR;  Service: Orthopedics;  Laterality: Left;  Patient states has problems if he will have a tube in throat for Genera; Anesthesia   Social History:   reports that he has quit smoking. His smoking use included cigarettes. He has never used smokeless tobacco. He reports that he does not drink alcohol and does not use drugs.  Family History  Problem Relation Age of Onset   Stroke Father    Atrial fibrillation Brother        on coumadin   Heart disease Brother        AFib- coumadin   Stroke Paternal Grandfather    Other Mother        hemorrhage   Cancer Other        colon cancer at early age    Prostate cancer Neg Hx    Kidney cancer Neg Hx    Bladder Cancer Neg Hx     Medications: Patient's Medications  New Prescriptions   No medications on file  Previous Medications   ALBUTEROL  (PROVENTIL ) (2.5 MG/3ML) 0.083% NEBULIZER SOLUTION    TAKE 3MLS(1 VIAL) VIA NEBULIZER EVERY 6 HOURS AS NEEDED   CETIRIZINE  (ZYRTEC ) 10 MG TABLET    TAKE 1 TABLET(10 MG) BY MOUTH DAILY   CHOLECALCIFEROL  (VITAMIN D3) 1000 UNITS CAPS    Take 1,000 Units by mouth daily.   ELIQUIS  5 MG TABS TABLET    TAKE 1 TABLET BY MOUTH TWICE DAILY   EMPAGLIFLOZIN  (JARDIANCE ) 10 MG TABS TABLET    Take 1 tablet (10 mg total) by mouth daily.   FLUTICASONE -UMECLIDIN-VILANT (TRELEGY ELLIPTA ) 100-62.5-25 MCG/ACT AEPB    Inhale 1 puff into the lungs daily.   FUROSEMIDE  (LASIX ) 40 MG  TABLET    Take 1 tablet (40 mg total) by mouth daily.   GABAPENTIN  (NEURONTIN ) 300 MG CAPSULE    TAKE 1 CAPSULE(300 MG) BY MOUTH TWICE DAILY   GUAIFENESIN -DEXTROMETHORPHAN  (ROBITUSSIN DM) 100-10 MG/5ML SYRUP    Take 10 mLs by mouth every 4 (four) hours as needed for cough.   LEVOTHYROXINE  (SYNTHROID ) 25 MCG TABLET    TAKE 1 TABLET BY MOUTH EVERY DAY BEFORE BREAKFAST AS DIRECTED   OXYGEN     Inhale 4 L/min into the lungs at bedtime.   PRAVASTATIN  (PRAVACHOL ) 40 MG TABLET    TAKE 1 TABLET BY MOUTH EVERY DAY   SPIRONOLACTONE  (ALDACTONE ) 25 MG TABLET    Take 0.5 tablets (12.5 mg total) by mouth daily.  Modified Medications   No medications on file  Discontinued Medications   No medications on file    Physical Exam:  Vitals:   05/01/24 1317  BP: 118/60  Pulse: 70  Resp: 20  Temp: 98.6 F (37 C)  SpO2: 95%  Weight: 175 lb (79.4 kg)  Height: 5' 6 (1.676 m)   Body mass index is 28.25 kg/m. Wt Readings from Last 3 Encounters:  05/01/24 175 lb (79.4 kg)  04/24/24 173 lb 3.2 oz (78.6 kg)  04/12/24 179 lb 10.8 oz (81.5 kg)    Physical Exam Constitutional:      General: He is not in acute distress.    Appearance: He is well-developed. He  is not diaphoretic.  HENT:     Head: Normocephalic and atraumatic.     Right Ear: External ear normal.     Left Ear: External ear normal.     Mouth/Throat:     Pharynx: No oropharyngeal exudate.  Eyes:     Conjunctiva/sclera: Conjunctivae normal.     Pupils: Pupils are equal, round, and reactive to light.  Cardiovascular:     Rate and Rhythm: Normal rate and regular rhythm.     Heart sounds: Normal heart sounds.  Pulmonary:     Effort: Pulmonary effort is normal.     Breath sounds: Normal breath sounds.  Abdominal:     General: Bowel sounds are normal.     Palpations: Abdomen is soft.  Musculoskeletal:        General: No tenderness.     Cervical back: Normal range of motion and neck supple.     Right lower leg: Edema present.     Left lower leg: Edema present.  Skin:    General: Skin is warm and dry.  Neurological:     Mental Status: He is alert and oriented to person, place, and time.     Labs reviewed: Basic Metabolic Panel: Recent Labs    09/15/23 0626 09/16/23 0537 09/17/23 0650 09/18/23 1004 09/19/23 0927 09/20/23 0540 10/06/23 1030 04/09/24 0508 04/10/24 0227 04/11/24 0228 04/12/24 0130 04/24/24 0953  NA 137   < > 140 136   < > 138   < > 140   < > 137 138 143  K 4.1   < > 3.9 4.0   < > 3.8   < > 3.8   < > 4.1 3.8 4.7  CL 102   < > 98 95*   < > 96*   < > 102   < > 97* 101 106  CO2 24   < > 31 28   < > 32   < > 30   < > 29 26 22   GLUCOSE 164*   < > 117* 156*   < >  120*   < > 98   < > 86 107* 86  BUN 28*   < > 33* 34*   < > 37*   < > 24*   < > 37* 38* 29*  CREATININE 1.25*   < > 1.36* 1.36*   < > 1.03   < > 1.24   < > 1.47* 1.27* 1.24  CALCIUM 8.5*   < > 8.5* 8.7*   < > 8.9   < > 8.9   < > 8.8* 8.7* 9.7  MG 2.0   < > 2.2 2.3  --  2.4  --   --   --   --   --   --   PHOS 2.4*  --   --   --   --   --   --   --   --   --   --   --   TSH  --   --   --   --   --   --   --  2.539  --   --   --   --    < > = values in this interval not displayed.   Liver  Function Tests: Recent Labs    06/23/23 0952 09/14/23 1707 04/09/24 0508  AST 35 40 41  ALT 32 40 34  ALKPHOS  --  47 65  BILITOT 1.3* 1.7* 2.3*  PROT 7.1 6.9 6.9  ALBUMIN  --  3.7 3.6   No results for input(s): LIPASE, AMYLASE in the last 8760 hours. No results for input(s): AMMONIA in the last 8760 hours. CBC: Recent Labs    04/03/24 1521 04/08/24 1749 04/09/24 0508  WBC 6.7 6.5 5.8  NEUTROABS 4,040 3.7 3.5  HGB 12.9* 14.4 13.8  HCT 40.9 46.4 44.4  MCV 93.4 96.7 95.7  PLT 155 209 152   Lipid Panel: No results for input(s): CHOL, HDL, LDLCALC, TRIG, CHOLHDL, LDLDIRECT in the last 8760 hours. TSH: Recent Labs    04/09/24 0508  TSH 2.539   A1C: Lab Results  Component Value Date   HGBA1C 5.7 (H) 05/16/2016     Assessment/Plan  Chronic heart failure with preserved ejection fraction (HCC)  Recent exacerbation managed with diuretics and empagliflozin . Emphasized fluid balance to prevent rehospitalization. Euvolemic today - Continue furosemide , spironolactone , and empagliflozin . - Monitor weight daily and report significant increases. - Use compression hose and elevate legs. - Follow up with cardiologist as scheduled  -     COMPLETE METABOLIC PANEL WITHOUT GFR  PAF (paroxysmal atrial fibrillation) (HCC) Heart rate stable after discontinuing metoprolol  due to bradycardia. Emphasized monitoring for increased heart rate symptoms. - Monitor for symptoms of increased heart rate or palpitations -     COMPLETE METABOLIC PANEL WITHOUT GFR  Chronic obstructive pulmonary disease, unspecified COPD type (HCC)  Stable, continues on trelegy   Hypothyroidism, unspecified type  TSH at goal on synthroid     Essential hypertension -     COMPLETE METABOLIC PANEL WITHOUT GFR Blood pressure well controlled, goal bp <140/90 Continue current medications and dietary modifications follow metabolic panel  Mixed hyperlipidemia  Due for follow up     Lipid  panel Will get with next labs, continues on pravachol    Return in about 4 months (around 09/01/2024) for routine follow up.  Nao Linz K. Caro BODILY [facility] [phone]  "

## 2024-05-03 ENCOUNTER — Encounter: Payer: Self-pay | Admitting: Pharmacist

## 2024-05-03 NOTE — Progress Notes (Signed)
   05/03/2024  Patient ID: Eric [name], male   DOB: 11-27-1932, 89 y.o.   MRN: [id number]  Chart review for medication adherence on Pravastatin  40 mg as he appeared on the MAC report as "at risk" of failing the MAC measure. Pravastatin  40 mg was filled 04/03/24 for a 90 day supply.  Geronimo Krabbe, PharmD, BCACP Clinical Pharmacist [phone]

## 2024-05-04 DIAGNOSIS — J4489 Other specified chronic obstructive pulmonary disease: Secondary | ICD-10-CM | POA: Diagnosis not present

## 2024-05-08 DIAGNOSIS — D6869 Other thrombophilia: Secondary | ICD-10-CM

## 2024-05-08 DIAGNOSIS — R32 Unspecified urinary incontinence: Secondary | ICD-10-CM

## 2024-05-08 DIAGNOSIS — J441 Chronic obstructive pulmonary disease with (acute) exacerbation: Secondary | ICD-10-CM | POA: Diagnosis not present

## 2024-05-08 DIAGNOSIS — Z7984 Long term (current) use of oral hypoglycemic drugs: Secondary | ICD-10-CM

## 2024-05-08 DIAGNOSIS — I130 Hypertensive heart and chronic kidney disease with heart failure and stage 1 through stage 4 chronic kidney disease, or unspecified chronic kidney disease: Secondary | ICD-10-CM | POA: Diagnosis not present

## 2024-05-08 DIAGNOSIS — M1990 Unspecified osteoarthritis, unspecified site: Secondary | ICD-10-CM

## 2024-05-08 DIAGNOSIS — E559 Vitamin D deficiency, unspecified: Secondary | ICD-10-CM

## 2024-05-08 DIAGNOSIS — D510 Vitamin B12 deficiency anemia due to intrinsic factor deficiency: Secondary | ICD-10-CM

## 2024-05-08 DIAGNOSIS — M797 Fibromyalgia: Secondary | ICD-10-CM

## 2024-05-08 DIAGNOSIS — I5033 Acute on chronic diastolic (congestive) heart failure: Secondary | ICD-10-CM | POA: Diagnosis not present

## 2024-05-08 DIAGNOSIS — K219 Gastro-esophageal reflux disease without esophagitis: Secondary | ICD-10-CM

## 2024-05-08 DIAGNOSIS — Z87891 Personal history of nicotine dependence: Secondary | ICD-10-CM

## 2024-05-08 DIAGNOSIS — G459 Transient cerebral ischemic attack, unspecified: Secondary | ICD-10-CM

## 2024-05-08 DIAGNOSIS — N1831 Chronic kidney disease, stage 3a: Secondary | ICD-10-CM | POA: Diagnosis not present

## 2024-05-08 DIAGNOSIS — Z86711 Personal history of pulmonary embolism: Secondary | ICD-10-CM

## 2024-05-08 DIAGNOSIS — Z7901 Long term (current) use of anticoagulants: Secondary | ICD-10-CM

## 2024-05-08 DIAGNOSIS — F419 Anxiety disorder, unspecified: Secondary | ICD-10-CM

## 2024-05-08 DIAGNOSIS — E782 Mixed hyperlipidemia: Secondary | ICD-10-CM

## 2024-05-08 DIAGNOSIS — M8580 Other specified disorders of bone density and structure, unspecified site: Secondary | ICD-10-CM | POA: Diagnosis not present

## 2024-05-08 DIAGNOSIS — J8410 Pulmonary fibrosis, unspecified: Secondary | ICD-10-CM

## 2024-05-08 DIAGNOSIS — Z9981 Dependence on supplemental oxygen: Secondary | ICD-10-CM

## 2024-05-08 DIAGNOSIS — E440 Moderate protein-calorie malnutrition: Secondary | ICD-10-CM | POA: Diagnosis not present

## 2024-05-08 DIAGNOSIS — Z87442 Personal history of urinary calculi: Secondary | ICD-10-CM

## 2024-05-08 DIAGNOSIS — E039 Hypothyroidism, unspecified: Secondary | ICD-10-CM

## 2024-05-08 DIAGNOSIS — M5030 Other cervical disc degeneration, unspecified cervical region: Secondary | ICD-10-CM

## 2024-05-08 DIAGNOSIS — Z96649 Presence of unspecified artificial hip joint: Secondary | ICD-10-CM

## 2024-05-08 DIAGNOSIS — Z87440 Personal history of urinary (tract) infections: Secondary | ICD-10-CM

## 2024-05-08 DIAGNOSIS — Z6828 Body mass index (BMI) 28.0-28.9, adult: Secondary | ICD-10-CM

## 2024-05-08 DIAGNOSIS — J9601 Acute respiratory failure with hypoxia: Secondary | ICD-10-CM | POA: Diagnosis not present

## 2024-05-08 DIAGNOSIS — I480 Paroxysmal atrial fibrillation: Secondary | ICD-10-CM | POA: Diagnosis not present

## 2024-05-08 DIAGNOSIS — I700 Atherosclerosis of aorta: Secondary | ICD-10-CM | POA: Diagnosis not present

## 2024-05-08 DIAGNOSIS — F329 Major depressive disorder, single episode, unspecified: Secondary | ICD-10-CM | POA: Diagnosis not present

## 2024-05-08 DIAGNOSIS — M4802 Spinal stenosis, cervical region: Secondary | ICD-10-CM | POA: Diagnosis not present

## 2024-05-08 DIAGNOSIS — K222 Esophageal obstruction: Secondary | ICD-10-CM

## 2024-05-08 DIAGNOSIS — G959 Disease of spinal cord, unspecified: Secondary | ICD-10-CM

## 2024-05-08 DIAGNOSIS — D500 Iron deficiency anemia secondary to blood loss (chronic): Secondary | ICD-10-CM | POA: Diagnosis not present

## 2024-05-08 DIAGNOSIS — Z7951 Long term (current) use of inhaled steroids: Secondary | ICD-10-CM

## 2024-05-09 ENCOUNTER — Telehealth: Payer: Self-pay

## 2024-05-09 MED ORDER — SPIRONOLACTONE 25 MG PO TABS: 12.5000 mg | ORAL_TABLET | Freq: Every day | ORAL | 1 refills | Status: DC

## 2024-05-09 MED ORDER — EMPAGLIFLOZIN 10 MG PO TABS: 10.0000 mg | ORAL_TABLET | Freq: Every day | ORAL | 1 refills | Status: DC

## 2024-05-09 NOTE — Telephone Encounter (Signed)
 Copied from CRM [id number]. Topic: Clinical - Medication Question >> Mulhearn 29, 2025  2:31 PM Karole Pacer C wrote: Reason for CRM: Patient would like his provider to refill the following medications he recv'd while in the hospital.  spironolactone  (ALDACTONE ) 25 MG tablet empagliflozin  (JARDIANCE ) 10 MG TABS tablet

## 2024-05-09 NOTE — Telephone Encounter (Signed)
 Patients daughter in law, Almira Armour Chea is aware rx's approved

## 2024-05-16 ENCOUNTER — Ambulatory Visit: Admitting: Family

## 2024-06-03 ENCOUNTER — Ambulatory Visit: Admitting: Nurse Practitioner

## 2024-06-04 DIAGNOSIS — J4489 Other specified chronic obstructive pulmonary disease: Secondary | ICD-10-CM | POA: Diagnosis not present

## 2024-06-07 ENCOUNTER — Encounter: Payer: Self-pay | Admitting: Nurse Practitioner

## 2024-06-07 ENCOUNTER — Telehealth (INDEPENDENT_AMBULATORY_CARE_PROVIDER_SITE_OTHER): Admitting: Nurse Practitioner

## 2024-06-07 VITALS — Ht 66.0 in

## 2024-06-07 DIAGNOSIS — R600 Localized edema: Secondary | ICD-10-CM | POA: Diagnosis not present

## 2024-06-07 DIAGNOSIS — I5032 Chronic diastolic (congestive) heart failure: Secondary | ICD-10-CM

## 2024-06-07 DIAGNOSIS — L03115 Cellulitis of right lower limb: Secondary | ICD-10-CM

## 2024-06-07 MED ORDER — DOXYCYCLINE HYCLATE 100 MG PO TABS: 100.0000 mg | ORAL_TABLET | Freq: Two times a day (BID) | ORAL | 0 refills | Status: DC

## 2024-06-07 NOTE — Progress Notes (Signed)
 Careteam: Patient Care Team: Caro Harlene POUR, NP as PCP - General (Geriatric Medicine) Perla Evalene PARAS, MD as PCP - Cardiology (Cardiology) Perla Evalene PARAS, MD as Consulting Physician (Cardiology) Cesario Boer, MD as Attending Physician (Physical Medicine and Rehabilitation) Beuford Anes, MD as Consulting Physician (Orthopedic Surgery) Linard Alm NOVAK, MD (Inactive) as Consulting Physician (Pulmonary Disease)  Advanced Directive information    Allergies  Allergen Reactions   Morphine And Codeine Shortness Of Breath   Percocet [Oxycodone-Acetaminophen ] Shortness Of Breath   Valium Shortness Of Breath    Chief Complaint  Patient presents with   Leg Swelling    Right leg     Discussed the use of AI scribe software for clinical note transcription with the patient, who gave verbal consent to proceed.  History of Present Illness Eric [name] is a 89 year old male with heart failure who presents with right leg swelling.  He has experienced significant swelling in his right leg for the past two days, which is severe enough to obscure the ankle. The swelling is more pronounced in the right leg compared to the left.  He describes the swelling as 'really tight' at the bottom of the leg and foot.  He mentions a scratch on the right leg that occurred yesterday, which was bleeding and required a bandage. The leg appears red. The bandage has yellow pus and some blood. He denies warmth in the leg, stating it 'stays cold'.   He is currently taking Eliquis  twice daily and confirms compliance with this medication. He also takes Lasix  and confirms taking it this morning. He has not noticed any weight gain, maintaining a weight of 175 pounds.  No shortness of breath. The back of the calf is not tender, but the front feels tight. He does not experience significant pain when the foot is touched, only a slight sensation.   Review of Systems:  Review of Systems  Constitutional:  Negative  for chills, fever and weight loss.  HENT:  Negative for tinnitus.   Respiratory:  Negative for cough, sputum production and shortness of breath.   Cardiovascular:  Positive for leg swelling. Negative for chest pain and palpitations.  Gastrointestinal:  Negative for abdominal pain, constipation and diarrhea.  Genitourinary:  Negative for dysuria, frequency and urgency.  Musculoskeletal:  Negative for back pain, falls, joint pain and myalgias.  Skin: Negative.   Psychiatric/Behavioral:  Negative for depression and memory loss. The patient does not have insomnia.     Past Medical History:  Diagnosis Date   Anxiety    Arthritis    Atypical chest pain    a. 05/2017 MV: no ischemia, EF 79%.   Chest pain 10/20/2017   Chronic diastolic CHF (congestive heart failure) (HCC)    a. 01/2011 Echo: EF 50-55%, gr1 DD, mild AI, nl RV fxn, mild TR/PR; b. 10/2017 Echo: EF 60-65%, mild LVH, gr2 DD.   DDD (degenerative disc disease), cervical    Depressive disorder, not elsewhere classified    Difficult intubation    Dysphagia, oral phase    Dyspnea    Edema    Gallstones    a. Symptomatic - s/p lap chole 05/2018.   History of DVT (deep vein thrombosis)    History of kidney stones    Hypertension    Hypoxemia    Impacted cerumen    Ischemic colitis (HCC)    a. 02/2018 GIB - colonoscopy w/ isch colitis. Anticoagulation resumed.   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 a. 01/2011 in setting of post-op complications including aspiration pna;  b. CHA2DS2VASc = 4--> Amio/Eliquis .   Pneumonia 03/06/2003   Spinal stenosis, unspecified region other than cervical    Squamous cell carcinoma of skin of trunk, except scrotum     skin cancer of shoulder   Syncope    a. 10/2017-->Event monitor: RSR, rare PACs/PVCs.   Thoracic or lumbosacral neuritis or radiculitis, unspecified    Past Surgical History:  Procedure Laterality Date   CARDIOVERSION N/A 06/20/2017   Procedure: CARDIOVERSION;  Surgeon: Perla Evalene PARAS, MD;  Location: [facility] ORS;  Service: Cardiovascular;  Laterality: N/A;   CATARACT EXTRACTION W/ INTRAOCULAR LENS  IMPLANT, BILATERAL     CERVICAL FUSION  02/10/2011   CHOLECYSTECTOMY  05/24/2018   CHOLECYSTECTOMY N/A 05/24/2018   Procedure: LAPAROSCOPIC CHOLECYSTECTOMY;  Surgeon: Vernetta Berg, MD;  Location: [facility];  Service: General;  Laterality: N/A;   COLONOSCOPY WITH PROPOFOL  N/A 02/20/2018   Procedure: COLONOSCOPY WITH PROPOFOL ;  Surgeon: Jinny Carmine, MD;  Location: [facility] ENDOSCOPY;  Service: Endoscopy;  Laterality: N/A;   history of abd ultrasound  11/01   fatty liver   MULTIPLE TOOTH EXTRACTIONS     ORIF FIBULA FRACTURE Left 01/06/2017   Procedure: OPEN REDUCTION INTERNAL FIXATION (ORIF) FIBULA FRACTURE DISTAL FIBULA;  Surgeon: Maude Herald, MD;  Location: [facility] OR;  Service: Orthopedics;  Laterality: Left;  Patient states has problems if he will have a tube in throat for Genera; Anesthesia   Social History:   reports that he has quit smoking. His smoking use included cigarettes. He has never used smokeless tobacco. He reports that he does not drink alcohol and does not use drugs.  Family History  Problem Relation Age of Onset   Stroke Father    Atrial fibrillation Brother        on coumadin   Heart disease Brother        AFib- coumadin   Stroke Paternal Grandfather    Other Mother        hemorrhage   Cancer Other        colon cancer at early age   Prostate cancer Neg Hx    Kidney cancer Neg Hx    Bladder Cancer Neg Hx     Medications: Patient's Medications  New Prescriptions   No medications on file  Previous Medications   ALBUTEROL  (PROVENTIL ) (2.5 MG/3ML) 0.083% NEBULIZER SOLUTION     TAKE 3MLS(1 VIAL) VIA NEBULIZER EVERY 6 HOURS AS NEEDED   CETIRIZINE  (ZYRTEC ) 10 MG TABLET    TAKE 1 TABLET(10 MG) BY MOUTH DAILY   CHOLECALCIFEROL  (VITAMIN D3) 1000 UNITS CAPS    Take 1,000 Units by mouth daily.   ELIQUIS  5 MG TABS TABLET    TAKE 1 TABLET BY MOUTH TWICE DAILY   EMPAGLIFLOZIN  (JARDIANCE ) 10 MG TABS TABLET    Take 1 tablet (10 mg total) by mouth daily.   FLUTICASONE -UMECLIDIN-VILANT (TRELEGY ELLIPTA ) 100-62.5-25 MCG/ACT AEPB    Inhale 1 puff into the lungs daily.   FUROSEMIDE  (LASIX ) 40 MG TABLET    Take 1 tablet (40 mg total) by mouth daily.   GABAPENTIN  (NEURONTIN )  300 MG CAPSULE    TAKE 1 CAPSULE(300 MG) BY MOUTH TWICE DAILY   GUAIFENESIN -DEXTROMETHORPHAN  (ROBITUSSIN DM) 100-10 MG/5ML SYRUP    Take 10 mLs by mouth every 4 (four) hours as needed for cough.   LEVOTHYROXINE  (SYNTHROID ) 25 MCG TABLET    TAKE 1 TABLET BY MOUTH EVERY DAY BEFORE BREAKFAST AS DIRECTED   OXYGEN     Inhale 4 L/min into the lungs at bedtime.   PRAVASTATIN  (PRAVACHOL ) 40 MG TABLET    TAKE 1 TABLET BY MOUTH EVERY DAY   SPIRONOLACTONE  (ALDACTONE ) 25 MG TABLET    Take 0.5 tablets (12.5 mg total) by mouth daily.  Modified Medications   No medications on file  Discontinued Medications   No medications on file    Physical Exam:  Vitals:   06/07/24 1245  Height: 5' 6 (1.676 m)   Body mass index is 28.25 kg/m. Wt Readings from Last 3 Encounters:  05/01/24 175 lb (79.4 kg)  04/24/24 173 lb 3.2 oz (78.6 kg)  04/12/24 179 lb 10.8 oz (81.5 kg)    Physical Exam Constitutional:      Appearance: Normal appearance.   Musculoskeletal:     Right lower leg: Swelling present. No tenderness.     Comments: Swelling and redness noted to right lower leg, recent cat scratch.    Neurological:     Mental Status: He is alert. Mental status is at baseline.   Psychiatric:        Mood and Affect: Mood normal.    Labs reviewed: Basic Metabolic Panel: Recent Labs    09/15/23 0626 09/16/23 0537  09/17/23 0650 09/18/23 1004 09/19/23 0927 09/20/23 0540 10/06/23 1030 04/09/24 0508 04/10/24 0227 04/11/24 0228 04/12/24 0130 04/24/24 0953  NA 137   < > 140 136   < > 138   < > 140   < > 137 138 143  K 4.1   < > 3.9 4.0   < > 3.8   < > 3.8   < > 4.1 3.8 4.7  CL 102   < > 98 95*   < > 96*   < > 102   < > 97* 101 106  CO2 24   < > 31 28   < > 32   < > 30   < > 29 26 22   GLUCOSE 164*   < > 117* 156*   < > 120*   < > 98   < > 86 107* 86  BUN 28*   < > 33* 34*   < > 37*   < > 24*   < > 37* 38* 29*  CREATININE 1.25*   < > 1.36* 1.36*   < > 1.03   < > 1.24   < > 1.47* 1.27* 1.24  CALCIUM 8.5*   < > 8.5* 8.7*   < > 8.9   < > 8.9   < > 8.8* 8.7* 9.7  MG 2.0   < > 2.2 2.3  --  2.4  --   --   --   --   --   --   PHOS 2.4*  --   --   --   --   --   --   --   --   --   --   --   TSH  --   --   --   --   --   --   --  2.539  --   --   --   --    < > =  values in this interval not displayed.   Liver Function Tests: Recent Labs    06/23/23 0952 09/14/23 1707 04/09/24 0508  AST 35 40 41  ALT 32 40 34  ALKPHOS  --  47 65  BILITOT 1.3* 1.7* 2.3*  PROT 7.1 6.9 6.9  ALBUMIN  --  3.7 3.6   No results for input(s): LIPASE, AMYLASE in the last 8760 hours. No results for input(s): AMMONIA in the last 8760 hours. CBC: Recent Labs    04/03/24 1521 04/08/24 1749 04/09/24 0508  WBC 6.7 6.5 5.8  NEUTROABS 4,040 3.7 3.5  HGB 12.9* 14.4 13.8  HCT 40.9 46.4 44.4  MCV 93.4 96.7 95.7  PLT 155 209 152   Lipid Panel: No results for input(s): CHOL, HDL, LDLCALC, TRIG, CHOLHDL, LDLDIRECT in the last 8760 hours. TSH: Recent Labs    04/09/24 0508  TSH 2.539   A1C: Lab Results  Component Value Date   HGBA1C 5.7 (H) 05/16/2016     Assessment/Plan Assessment and Plan Assessment & Plan Right Leg Cellulitis Swelling, redness, and exudate indicate cellulitis, likely from a cat scratch. No systemic infection signs. Doxycycline  chosen for skin infection efficacy. - Prescribe  doxycycline  for 7 days. - Advise use of compression hose with assistance. - Elevate the leg. - Instruct to seek in-person care if symptoms worsen or do not improve, especially over the weekend.  Lower Extremity Edema Swelling in right leg. Eliquis  reduces clot risk. Heart failure-related fluid overload considered, but stable weight suggests no significant retention. - Administer an additional dose of Lasix  40 mg twice daily for three days. - Monitor weight daily. - Instruct to seek urgent care if symptoms worsen over the weekend.  Heart Failure Heart failure Going contribute to edema. Weight stability suggests no significant fluid retention. Additional Lasix  for potential fluid overload. - Continue current heart failure management.  Abiageal Blowe K. Caro [name]  [facility] [phone]    Virtual Visit via video  I connected with patient on 06/07/24 at  1:00 PM EDT by mychart and verified that I am speaking with the correct person using two identifiers.  Location: Patient: home Provider: [facility]   I discussed the limitations, risks, security and privacy concerns of performing an evaluation and management service by telephone and the availability of in person appointments. I also discussed with the patient that there Hobby be a patient responsible charge related to this service. The patient expressed understanding and agreed to proceed.   I discussed the assessment and treatment plan with the patient. The patient was provided an opportunity to ask questions and all were answered. The patient agreed with the plan and demonstrated an understanding of the instructions.   The patient was advised to call back or seek an in-person evaluation if the symptoms worsen or if the condition fails to improve as anticipated.  I provided 20 minutes of non-face-to-face time during this encounter.  Eric [name] Avs printed and mailed

## 2024-06-07 NOTE — Progress Notes (Signed)
 This service is provided via telemedicine  No vital signs collected/recorded due to the encounter was a telemedicine visit.   Location of patient (ex: home, work):  home  Patient consents to a telephone visit: yes  Location of the provider (ex: office, home):  [facility]   Name of any referring provider:  N/A  Names of all persons participating in the telemedicine service and their role in the encounter:  Marithza Malachi/ RMA, Roselie Conger, Champ Coma, NP, and Patient.   Time spent on call:  11

## 2024-06-19 ENCOUNTER — Emergency Department (HOSPITAL_COMMUNITY)

## 2024-06-19 ENCOUNTER — Encounter (HOSPITAL_COMMUNITY): Payer: Self-pay

## 2024-06-19 ENCOUNTER — Inpatient Hospital Stay (HOSPITAL_COMMUNITY)
Admission: EM | Admit: 2024-06-19 | Discharge: 2024-06-21 | DRG: 602 | Disposition: A | Discharge status: Home Health | Attending: Internal Medicine | Admitting: Internal Medicine

## 2024-06-19 DIAGNOSIS — J9811 Atelectasis: Secondary | ICD-10-CM | POA: Diagnosis not present

## 2024-06-19 DIAGNOSIS — Z7901 Long term (current) use of anticoagulants: Secondary | ICD-10-CM | POA: Diagnosis not present

## 2024-06-19 DIAGNOSIS — Z86718 Personal history of other venous thrombosis and embolism: Secondary | ICD-10-CM

## 2024-06-19 DIAGNOSIS — I4891 Unspecified atrial fibrillation: Secondary | ICD-10-CM | POA: Diagnosis not present

## 2024-06-19 DIAGNOSIS — I480 Paroxysmal atrial fibrillation: Secondary | ICD-10-CM | POA: Diagnosis not present

## 2024-06-19 DIAGNOSIS — N1831 Chronic kidney disease, stage 3a: Secondary | ICD-10-CM | POA: Diagnosis present

## 2024-06-19 DIAGNOSIS — J449 Chronic obstructive pulmonary disease, unspecified: Secondary | ICD-10-CM | POA: Diagnosis present

## 2024-06-19 DIAGNOSIS — R001 Bradycardia, unspecified: Secondary | ICD-10-CM | POA: Diagnosis not present

## 2024-06-19 DIAGNOSIS — R609 Edema, unspecified: Secondary | ICD-10-CM | POA: Diagnosis not present

## 2024-06-19 DIAGNOSIS — J9610 Chronic respiratory failure, unspecified whether with hypoxia or hypercapnia: Secondary | ICD-10-CM | POA: Diagnosis present

## 2024-06-19 DIAGNOSIS — Z66 Do not resuscitate: Secondary | ICD-10-CM | POA: Diagnosis present

## 2024-06-19 DIAGNOSIS — Z79899 Other long term (current) drug therapy: Secondary | ICD-10-CM

## 2024-06-19 DIAGNOSIS — E039 Hypothyroidism, unspecified: Secondary | ICD-10-CM | POA: Diagnosis present

## 2024-06-19 DIAGNOSIS — R0989 Other specified symptoms and signs involving the circulatory and respiratory systems: Secondary | ICD-10-CM | POA: Diagnosis not present

## 2024-06-19 DIAGNOSIS — I5032 Chronic diastolic (congestive) heart failure: Secondary | ICD-10-CM | POA: Diagnosis present

## 2024-06-19 DIAGNOSIS — Z87891 Personal history of nicotine dependence: Secondary | ICD-10-CM

## 2024-06-19 DIAGNOSIS — Z85828 Personal history of other malignant neoplasm of skin: Secondary | ICD-10-CM

## 2024-06-19 DIAGNOSIS — Z79890 Hormone replacement therapy (postmenopausal): Secondary | ICD-10-CM

## 2024-06-19 DIAGNOSIS — I10 Essential (primary) hypertension: Secondary | ICD-10-CM | POA: Diagnosis not present

## 2024-06-19 DIAGNOSIS — E1122 Type 2 diabetes mellitus with diabetic chronic kidney disease: Secondary | ICD-10-CM | POA: Diagnosis present

## 2024-06-19 DIAGNOSIS — L03115 Cellulitis of right lower limb: Secondary | ICD-10-CM | POA: Diagnosis not present

## 2024-06-19 DIAGNOSIS — Z823 Family history of stroke: Secondary | ICD-10-CM

## 2024-06-19 DIAGNOSIS — I517 Cardiomegaly: Secondary | ICD-10-CM | POA: Diagnosis not present

## 2024-06-19 DIAGNOSIS — Z8249 Family history of ischemic heart disease and other diseases of the circulatory system: Secondary | ICD-10-CM

## 2024-06-19 DIAGNOSIS — L0390 Cellulitis, unspecified: Secondary | ICD-10-CM | POA: Diagnosis present

## 2024-06-19 DIAGNOSIS — I5033 Acute on chronic diastolic (congestive) heart failure: Secondary | ICD-10-CM | POA: Diagnosis present

## 2024-06-19 DIAGNOSIS — R0902 Hypoxemia: Secondary | ICD-10-CM | POA: Diagnosis not present

## 2024-06-19 DIAGNOSIS — Z7984 Long term (current) use of oral hypoglycemic drugs: Secondary | ICD-10-CM

## 2024-06-19 DIAGNOSIS — E782 Mixed hyperlipidemia: Secondary | ICD-10-CM | POA: Diagnosis present

## 2024-06-19 DIAGNOSIS — F32A Depression, unspecified: Secondary | ICD-10-CM | POA: Diagnosis present

## 2024-06-19 DIAGNOSIS — Z7951 Long term (current) use of inhaled steroids: Secondary | ICD-10-CM

## 2024-06-19 DIAGNOSIS — I130 Hypertensive heart and chronic kidney disease with heart failure and stage 1 through stage 4 chronic kidney disease, or unspecified chronic kidney disease: Secondary | ICD-10-CM | POA: Diagnosis present

## 2024-06-19 DIAGNOSIS — R531 Weakness: Secondary | ICD-10-CM | POA: Diagnosis not present

## 2024-06-19 DIAGNOSIS — L03119 Cellulitis of unspecified part of limb: Secondary | ICD-10-CM | POA: Diagnosis present

## 2024-06-19 DIAGNOSIS — F419 Anxiety disorder, unspecified: Secondary | ICD-10-CM | POA: Diagnosis present

## 2024-06-19 LAB — CBC WITH DIFFERENTIAL/PLATELET
Abs Immature Granulocytes: 0.06 K/uL (ref 0.00–0.07)
Basophils Absolute: 0.1 K/uL (ref 0.0–0.1)
Basophils Relative: 1 %
Eosinophils Absolute: 0.1 K/uL (ref 0.0–0.5)
Eosinophils Relative: 1 %
HCT: 49.7 % (ref 39.0–52.0)
Hemoglobin: 16 g/dL (ref 13.0–17.0)
Immature Granulocytes: 1 %
Lymphocytes Relative: 16 %
Lymphs Abs: 2.2 K/uL (ref 0.7–4.0)
MCH: 30.2 pg (ref 26.0–34.0)
MCHC: 32.2 g/dL (ref 30.0–36.0)
MCV: 93.8 fL (ref 80.0–100.0)
Monocytes Absolute: 0.6 K/uL (ref 0.1–1.0)
Monocytes Relative: 5 %
Neutro Abs: 10.1 K/uL — ABNORMAL HIGH (ref 1.7–7.7)
Neutrophils Relative %: 76 %
Platelets: 222 K/uL (ref 150–400)
RBC: 5.3 MIL/uL (ref 4.22–5.81)
RDW: 15.9 % — ABNORMAL HIGH (ref 11.5–15.5)
WBC: 13.1 K/uL — ABNORMAL HIGH (ref 4.0–10.5)
nRBC: 0 % (ref 0.0–0.2)

## 2024-06-19 LAB — COMPREHENSIVE METABOLIC PANEL WITH GFR
ALT: 40 U/L (ref 0–44)
AST: 42 U/L — ABNORMAL HIGH (ref 15–41)
Albumin: 4.2 g/dL (ref 3.5–5.0)
Alkaline Phosphatase: 61 U/L (ref 38–126)
Anion gap: 10 (ref 5–15)
BUN: 29 mg/dL — ABNORMAL HIGH (ref 8–23)
CO2: 25 mmol/L (ref 22–32)
Calcium: 9.5 mg/dL (ref 8.9–10.3)
Chloride: 102 mmol/L (ref 98–111)
Creatinine, Ser: 1.29 mg/dL — ABNORMAL HIGH (ref 0.61–1.24)
GFR, Estimated: 53 mL/min — ABNORMAL LOW (ref >60)
Glucose, Bld: 92 mg/dL (ref 70–99)
Potassium: 4.3 mmol/L (ref 3.5–5.1)
Sodium: 137 mmol/L (ref 135–145)
Total Bilirubin: 1.7 mg/dL — ABNORMAL HIGH (ref 0.0–1.2)
Total Protein: 8 g/dL (ref 6.5–8.1)

## 2024-06-19 LAB — I-STAT CG4 LACTIC ACID, ED: Lactic Acid, Venous: 1.6 mmol/L (ref 0.5–1.9)

## 2024-06-19 MED ORDER — ACETAMINOPHEN 325 MG PO TABS
650.0000 mg | ORAL_TABLET | Freq: Once | ORAL | Status: AC
  Administered 2024-06-20: 650 mg via ORAL
  Filled 2024-06-19: qty 2

## 2024-06-19 MED ORDER — SODIUM CHLORIDE 0.9 % IV SOLN
2.0000 g | Freq: Once | INTRAVENOUS | Status: AC
  Administered 2024-06-19: 2 g via INTRAVENOUS
  Filled 2024-06-19: qty 20

## 2024-06-19 NOTE — ED Provider Notes (Signed)
 [place] [facility] AT [facility] Provider Note   CSN: [id number] Arrival date & time: 06/19/24  2148     Patient presents with: Leg Pain (Pt arrived by ems d/t bilateral leg pain and left sided arm pain that have worsened since 1930 tonight. Per ems, pt has hx of CHF and COPD and needed fluid drawn off of him mid [place]. Per pt, pt stated that the swelling in his legs is baseline for him.)   Eric [name] is a 89 y.o. male.   Patient complains of pain in his right leg.  Patient reports that he frequently has swelling in his leg but he currently has pain in his right leg.  Patient reports increased redness to his right leg.  Patient reports he was treated 2 weeks ago for infection to his right leg.  Patient reports he has had some drainage from his leg.  Patient complains of pain to his left arm.  Pt reports he has been shaking tonight.  He is unsure if he has had a fever  The history is provided by the patient. No language interpreter was used.  Leg Pain Location:  Leg Injury: no   Leg location:  R leg Pain details:    Quality:  Aching   Radiates to:  Does not radiate   Onset quality:  Gradual   Timing:  Constant   Progression:  Worsening Chronicity:  New Dislocation: no   Associated symptoms: fatigue   Risk factors: no recent illness        Prior to Admission medications   Medication Sig Start Date End Date Taking? Authorizing Provider  albuterol  (PROVENTIL ) (2.5 MG/3ML) 0.083% nebulizer solution TAKE 3MLS(1 VIAL) VIA NEBULIZER EVERY 6 HOURS AS NEEDED 10/16/23   Caro Harlene POUR, NP  cetirizine  (ZYRTEC ) 10 MG tablet TAKE 1 TABLET(10 MG) BY MOUTH DAILY 03/22/24   Eubanks, Jessica K, NP  Cholecalciferol  (VITAMIN D3) 1000 units CAPS Take 1,000 Units by mouth daily.    [provider]  doxycycline  (VIBRA -TABS) 100 MG tablet Take 1 tablet (100 mg total) by mouth 2 (two) times daily. 06/07/24   Caro Harlene POUR, NP  ELIQUIS  5 MG TABS tablet TAKE 1 TABLET  BY MOUTH TWICE DAILY 04/24/23   Eubanks, Jessica K, NP  empagliflozin  (JARDIANCE ) 10 MG TABS tablet Take 1 tablet (10 mg total) by mouth daily. 05/09/24   Caro Harlene POUR, NP  Fluticasone -Umeclidin-Vilant (TRELEGY ELLIPTA ) 100-62.5-25 MCG/ACT AEPB Inhale 1 puff into the lungs daily. 01/10/24   Assaker, Darrin, MD  furosemide  (LASIX ) 40 MG tablet Take 1 tablet (40 mg total) by mouth daily. 04/13/24   Arrien, Mauricio Daniel, MD  gabapentin  (NEURONTIN ) 300 MG capsule TAKE 1 CAPSULE(300 MG) BY MOUTH TWICE DAILY 03/25/24   Eubanks, Jessica K, NP  guaiFENesin -dextromethorphan  (ROBITUSSIN DM) 100-10 MG/5ML syrup Take 10 mLs by mouth every 4 (four) hours as needed for cough. 09/20/23   Darci Pore, MD  levothyroxine  (SYNTHROID ) 25 MCG tablet TAKE 1 TABLET BY MOUTH EVERY DAY BEFORE BREAKFAST AS DIRECTED 03/22/24   Caro Harlene POUR, NP  OXYGEN  Inhale 4 L/min into the lungs at bedtime.    [provider]  pravastatin  (PRAVACHOL ) 40 MG tablet TAKE 1 TABLET BY MOUTH EVERY DAY 04/03/24   Eubanks, Jessica K, NP  spironolactone  (ALDACTONE ) 25 MG tablet Take 0.5 tablets (12.5 mg total) by mouth daily. 05/09/24   Eubanks, Jessica K, NP    Allergies: Morphine and codeine, Percocet [oxycodone-acetaminophen ], and Valium  Review of Systems  Constitutional:  Positive for chills and fatigue.  All other systems reviewed and are negative.   Updated Vital Signs BP (!) 148/110 (BP Location: Right Arm)   Pulse 80   Temp 99.8 F (37.7 C) (Rectal)   Resp (!) 22   Ht 5' 6 (1.676 m)   Wt 78.5 kg   SpO2 100%   BMI 27.92 kg/m   Physical Exam Vitals and nursing note reviewed.  Constitutional:      Appearance: He is well-developed.  HENT:     Head: Normocephalic.     Nose: Nose normal.     Mouth/Throat:     Mouth: Mucous membranes are moist.  Eyes:     Extraocular Movements: Extraocular movements intact.     Pupils: Pupils are equal, round, and reactive to light.  Cardiovascular:      Rate and Rhythm: Normal rate.  Pulmonary:     Effort: Pulmonary effort is normal.  Abdominal:     General: There is no distension.  Musculoskeletal:        General: Normal range of motion.     Cervical back: Normal range of motion.  Skin:    General: Skin is warm.  Neurological:     General: No focal deficit present.     Mental Status: He is alert and oriented to person, place, and time.  Psychiatric:        Mood and Affect: Mood normal.     (all labs ordered are listed, but only abnormal results are displayed) Labs Reviewed  CBC WITH DIFFERENTIAL/PLATELET - Abnormal; Notable for the following components:      Result Value   WBC 13.1 (*)    RDW 15.9 (*)    Neutro Abs 10.1 (*)    All other components within normal limits  CULTURE, BLOOD (ROUTINE X 2)  CULTURE, BLOOD (ROUTINE X 2)  COMPREHENSIVE METABOLIC PANEL WITH GFR  URINALYSIS, ROUTINE W REFLEX MICROSCOPIC  I-STAT CG4 LACTIC ACID, ED    EKG: None  Radiology: DG Chest Port 1 View Result Date: 06/19/2024 CLINICAL DATA:  Weakness EXAM: PORTABLE CHEST 1 VIEW COMPARISON:  04/08/2024 FINDINGS: Hardware in the cervical spine. Low lung volumes. Cardiomegaly with slight central bronchial vascular crowding. Linear atelectasis in the right lower lung. No consolidation, pleural effusion or pneumothorax IMPRESSION: Low lung volumes with cardiomegaly and right lower lung atelectasis. Electronically Signed   By: Luke Bun M.D.   On: 06/19/2024 22:33     Procedures   Medications Ordered in the ED  cefTRIAXone  (ROCEPHIN ) 2 g in sodium chloride  0.9 % 100 mL IVPB (has no administration in time range)                                    Medical Decision Making Pt complains of redness and swelling to his right leg.  Pt reports chills and shaking at home tonight  Amount and/or Complexity of Data Reviewed External Data Reviewed: notes.    Details: Primary care notes reviewed  Labs: ordered. Decision-making details documented in  ED Course.    Details: Labs ordered reviewed and interpreted  Wbc count 13. Radiology: ordered.  Risk OTC drugs. Decision regarding hospitalization.        Final diagnoses:  Cellulitis of right lower extremity    ED Discharge Orders     None          Flint Sonny POUR,  PA-C 06/19/24 2342    Melvenia Motto, MD 06/20/24 0010

## 2024-06-20 ENCOUNTER — Other Ambulatory Visit: Payer: Self-pay

## 2024-06-20 ENCOUNTER — Ambulatory Visit: Admitting: Podiatry

## 2024-06-20 DIAGNOSIS — Z7901 Long term (current) use of anticoagulants: Secondary | ICD-10-CM

## 2024-06-20 DIAGNOSIS — Z79899 Other long term (current) drug therapy: Secondary | ICD-10-CM | POA: Diagnosis not present

## 2024-06-20 DIAGNOSIS — I4891 Unspecified atrial fibrillation: Secondary | ICD-10-CM | POA: Diagnosis not present

## 2024-06-20 DIAGNOSIS — L0390 Cellulitis, unspecified: Secondary | ICD-10-CM | POA: Diagnosis present

## 2024-06-20 DIAGNOSIS — I5033 Acute on chronic diastolic (congestive) heart failure: Secondary | ICD-10-CM | POA: Diagnosis not present

## 2024-06-20 DIAGNOSIS — Z86718 Personal history of other venous thrombosis and embolism: Secondary | ICD-10-CM | POA: Diagnosis not present

## 2024-06-20 DIAGNOSIS — L03119 Cellulitis of unspecified part of limb: Secondary | ICD-10-CM | POA: Diagnosis present

## 2024-06-20 DIAGNOSIS — F32A Depression, unspecified: Secondary | ICD-10-CM | POA: Diagnosis not present

## 2024-06-20 DIAGNOSIS — Z66 Do not resuscitate: Secondary | ICD-10-CM | POA: Diagnosis not present

## 2024-06-20 DIAGNOSIS — I5032 Chronic diastolic (congestive) heart failure: Secondary | ICD-10-CM

## 2024-06-20 DIAGNOSIS — E782 Mixed hyperlipidemia: Secondary | ICD-10-CM | POA: Diagnosis not present

## 2024-06-20 DIAGNOSIS — Z85828 Personal history of other malignant neoplasm of skin: Secondary | ICD-10-CM | POA: Diagnosis not present

## 2024-06-20 DIAGNOSIS — Z79890 Hormone replacement therapy (postmenopausal): Secondary | ICD-10-CM | POA: Diagnosis not present

## 2024-06-20 DIAGNOSIS — J9811 Atelectasis: Secondary | ICD-10-CM | POA: Diagnosis not present

## 2024-06-20 DIAGNOSIS — Z823 Family history of stroke: Secondary | ICD-10-CM | POA: Diagnosis not present

## 2024-06-20 DIAGNOSIS — R0989 Other specified symptoms and signs involving the circulatory and respiratory systems: Secondary | ICD-10-CM | POA: Diagnosis not present

## 2024-06-20 DIAGNOSIS — R531 Weakness: Secondary | ICD-10-CM | POA: Diagnosis not present

## 2024-06-20 DIAGNOSIS — J9610 Chronic respiratory failure, unspecified whether with hypoxia or hypercapnia: Secondary | ICD-10-CM | POA: Diagnosis not present

## 2024-06-20 DIAGNOSIS — E039 Hypothyroidism, unspecified: Secondary | ICD-10-CM | POA: Diagnosis not present

## 2024-06-20 DIAGNOSIS — L03115 Cellulitis of right lower limb: Secondary | ICD-10-CM | POA: Diagnosis not present

## 2024-06-20 DIAGNOSIS — Z7984 Long term (current) use of oral hypoglycemic drugs: Secondary | ICD-10-CM | POA: Diagnosis not present

## 2024-06-20 DIAGNOSIS — N1831 Chronic kidney disease, stage 3a: Secondary | ICD-10-CM | POA: Diagnosis not present

## 2024-06-20 DIAGNOSIS — J449 Chronic obstructive pulmonary disease, unspecified: Secondary | ICD-10-CM | POA: Diagnosis not present

## 2024-06-20 DIAGNOSIS — I517 Cardiomegaly: Secondary | ICD-10-CM | POA: Diagnosis not present

## 2024-06-20 DIAGNOSIS — Z7951 Long term (current) use of inhaled steroids: Secondary | ICD-10-CM | POA: Diagnosis not present

## 2024-06-20 DIAGNOSIS — I480 Paroxysmal atrial fibrillation: Secondary | ICD-10-CM | POA: Diagnosis not present

## 2024-06-20 DIAGNOSIS — Z8249 Family history of ischemic heart disease and other diseases of the circulatory system: Secondary | ICD-10-CM | POA: Diagnosis not present

## 2024-06-20 DIAGNOSIS — I130 Hypertensive heart and chronic kidney disease with heart failure and stage 1 through stage 4 chronic kidney disease, or unspecified chronic kidney disease: Secondary | ICD-10-CM | POA: Diagnosis not present

## 2024-06-20 DIAGNOSIS — Z87891 Personal history of nicotine dependence: Secondary | ICD-10-CM | POA: Diagnosis not present

## 2024-06-20 DIAGNOSIS — F419 Anxiety disorder, unspecified: Secondary | ICD-10-CM | POA: Diagnosis not present

## 2024-06-20 DIAGNOSIS — E1122 Type 2 diabetes mellitus with diabetic chronic kidney disease: Secondary | ICD-10-CM | POA: Diagnosis not present

## 2024-06-20 LAB — BASIC METABOLIC PANEL WITH GFR
Anion gap: 11 (ref 5–15)
BUN: 27 mg/dL — ABNORMAL HIGH (ref 8–23)
CO2: 23 mmol/L (ref 22–32)
Calcium: 9 mg/dL (ref 8.9–10.3)
Chloride: 102 mmol/L (ref 98–111)
Creatinine, Ser: 1.37 mg/dL — ABNORMAL HIGH (ref 0.61–1.24)
GFR, Estimated: 49 mL/min — ABNORMAL LOW (ref >60)
Glucose, Bld: 107 mg/dL — ABNORMAL HIGH (ref 70–99)
Potassium: 4.4 mmol/L (ref 3.5–5.1)
Sodium: 136 mmol/L (ref 135–145)

## 2024-06-20 LAB — URINALYSIS, ROUTINE W REFLEX MICROSCOPIC
Bilirubin Urine: NEGATIVE
Glucose, UA: >=500 mg/dL — AB
Hgb urine dipstick: NEGATIVE
Ketones, ur: NEGATIVE mg/dL
Leukocytes,Ua: NEGATIVE
Nitrite: NEGATIVE
Protein, ur: NEGATIVE mg/dL
Specific Gravity, Urine: 1.018 (ref 1.005–1.030)
pH: 5 (ref 5.0–8.0)

## 2024-06-20 LAB — CBC
HCT: 44.6 % (ref 39.0–52.0)
Hemoglobin: 14.6 g/dL (ref 13.0–17.0)
MCH: 30.5 pg (ref 26.0–34.0)
MCHC: 32.7 g/dL (ref 30.0–36.0)
MCV: 93.3 fL (ref 80.0–100.0)
Platelets: 201 K/uL (ref 150–400)
RBC: 4.78 MIL/uL (ref 4.22–5.81)
RDW: 16.3 % — ABNORMAL HIGH (ref 11.5–15.5)
WBC: 15.3 K/uL — ABNORMAL HIGH (ref 4.0–10.5)
nRBC: 0 % (ref 0.0–0.2)

## 2024-06-20 LAB — GLUCOSE, CAPILLARY: Glucose-Capillary: 133 mg/dL — ABNORMAL HIGH (ref 70–99)

## 2024-06-20 MED ORDER — LEVOTHYROXINE SODIUM 25 MCG PO TABS
25.0000 ug | ORAL_TABLET | Freq: Every day | ORAL | Status: DC
  Administered 2024-06-20 – 2024-06-21 (×2): 25 ug via ORAL
  Filled 2024-06-20 (×2): qty 1

## 2024-06-20 MED ORDER — FUROSEMIDE 10 MG/ML IJ SOLN: 40.0000 mg | Freq: Once | INTRAMUSCULAR | Status: DC

## 2024-06-20 MED ORDER — EMPAGLIFLOZIN 10 MG PO TABS
10.0000 mg | ORAL_TABLET | Freq: Every day | ORAL | Status: DC
  Administered 2024-06-20 – 2024-06-21 (×2): 10 mg via ORAL
  Filled 2024-06-20 (×3): qty 1

## 2024-06-20 MED ORDER — PRAVASTATIN SODIUM 40 MG PO TABS
40.0000 mg | ORAL_TABLET | Freq: Every day | ORAL | Status: DC
  Administered 2024-06-20: 40 mg via ORAL
  Filled 2024-06-20 (×2): qty 1

## 2024-06-20 MED ORDER — ORAL CARE MOUTH RINSE: 15.0000 mL | OROMUCOSAL | Status: DC | PRN

## 2024-06-20 MED ORDER — FUROSEMIDE 10 MG/ML IJ SOLN
20.0000 mg | Freq: Once | INTRAMUSCULAR | Status: AC
  Administered 2024-06-20: 20 mg via INTRAVENOUS
  Filled 2024-06-20: qty 2

## 2024-06-20 MED ORDER — IBUPROFEN 200 MG PO TABS
400.0000 mg | ORAL_TABLET | Freq: Four times a day (QID) | ORAL | Status: DC | PRN
  Administered 2024-06-20 (×2): 400 mg via ORAL
  Filled 2024-06-20 (×2): qty 2

## 2024-06-20 MED ORDER — POLYETHYLENE GLYCOL 3350 17 G PO PACK: 17.0000 g | PACK | Freq: Every day | ORAL | Status: DC | PRN

## 2024-06-20 MED ORDER — ONDANSETRON HCL 4 MG/2ML IJ SOLN: 4.0000 mg | Freq: Four times a day (QID) | INTRAMUSCULAR | Status: DC | PRN

## 2024-06-20 MED ORDER — BUDESON-GLYCOPYRROL-FORMOTEROL 160-9-4.8 MCG/ACT IN AERO
2.0000 | INHALATION_SPRAY | Freq: Two times a day (BID) | RESPIRATORY_TRACT | Status: DC
  Administered 2024-06-20 – 2024-06-21 (×3): 2 via RESPIRATORY_TRACT
  Filled 2024-06-20: qty 5.9

## 2024-06-20 MED ORDER — INSULIN ASPART 100 UNIT/ML IJ SOLN: 0.0000 [IU] | Freq: Three times a day (TID) | INTRAMUSCULAR | Status: DC

## 2024-06-20 MED ORDER — FUROSEMIDE 10 MG/ML IJ SOLN
20.0000 mg | Freq: Every day | INTRAMUSCULAR | Status: DC
  Administered 2024-06-20 – 2024-06-21 (×2): 20 mg via INTRAVENOUS
  Filled 2024-06-20 (×2): qty 2

## 2024-06-20 MED ORDER — ONDANSETRON HCL 4 MG PO TABS: 4.0000 mg | ORAL_TABLET | Freq: Four times a day (QID) | ORAL | Status: DC | PRN

## 2024-06-20 MED ORDER — APIXABAN 5 MG PO TABS
5.0000 mg | ORAL_TABLET | Freq: Two times a day (BID) | ORAL | Status: DC
  Administered 2024-06-20 – 2024-06-21 (×3): 5 mg via ORAL
  Filled 2024-06-20 (×3): qty 1

## 2024-06-20 MED ORDER — LORATADINE 10 MG PO TABS
10.0000 mg | ORAL_TABLET | Freq: Every day | ORAL | Status: DC
  Administered 2024-06-20 – 2024-06-21 (×2): 10 mg via ORAL
  Filled 2024-06-20 (×2): qty 1

## 2024-06-20 MED ORDER — SODIUM CHLORIDE 0.9 % IV SOLN
2.0000 g | INTRAVENOUS | Status: DC
  Administered 2024-06-20: 2 g via INTRAVENOUS
  Filled 2024-06-20: qty 20

## 2024-06-20 MED ORDER — SPIRONOLACTONE 12.5 MG HALF TABLET
12.5000 mg | ORAL_TABLET | Freq: Every day | ORAL | Status: DC
  Administered 2024-06-20 – 2024-06-21 (×2): 12.5 mg via ORAL
  Filled 2024-06-20 (×3): qty 1

## 2024-06-20 NOTE — Progress Notes (Addendum)
 Courtesy visit No billing-  Patient is seen and examined today morning. Patient is admitted for right leg cellulitis. He is sitting in chair eating breakfast. Denies any complaints.  Per RN wife stated that he had a fall at home, also has swallowing difficulty.  Plan to continue IV Rocephin , gentle diuresis. Not diabetic, stopped insulin  regimen, CBG checks. Continue supplemental home oxygen . PT/ OT and SLP eval ordered. Further management pending clinical course.

## 2024-06-20 NOTE — ED Notes (Signed)
 CCMD contacted to admit the patient for cardiac monitoring.

## 2024-06-20 NOTE — Plan of Care (Signed)

## 2024-06-20 NOTE — H&P (Addendum)
 History and Physical    Patient: Eric [name] FMW:[id number] DOB: 12/31/1931 DOA: 06/19/2024 DOS: the patient was seen and examined on 06/20/2024 PCP: Caro Harlene POUR, NP  Patient coming from: Home  Chief Complaint:  Chief Complaint  Patient presents with   Leg Pain    Pt arrived by ems d/t bilateral leg pain and left sided arm pain that have worsened since 1930 tonight. Per ems, pt has hx of CHF and COPD and needed fluid drawn off of him mid [place]. Per pt, pt stated that the swelling in his legs is baseline for him.   HPI: Eric [name] is a 89 y.o. male with medical history significant for congestive heart failure, paroxysmal atrial fibrillation on Eliquis , DMT2, CKD stage IIIa, hypertension, hypothyroid, and hyperlipidemia who presents with complaints of pain in his legs.  The patient says the pain started fairly abruptly at 7:30 PM.  He denies chest pain or shortness of breath. He has swelling in legs, right greater than left all the time. In the emergency department the patient was evaluated.  He was started on Rocephin  for his cellulitis.   Review of Systems: As mentioned in the history of present illness. All other systems reviewed and are negative. Past Medical History:  Diagnosis Date   Anxiety    Arthritis    Atypical chest pain    a. 05/2017 MV: no ischemia, EF 79%.   Chest pain 10/20/2017   Chronic diastolic CHF (congestive heart failure) (HCC)    a. 01/2011 Echo: EF 50-55%, gr1 DD, mild AI, nl RV fxn, mild TR/PR; b. 10/2017 Echo: EF 60-65%, mild LVH, gr2 DD.   DDD (degenerative disc disease), cervical    Depressive disorder, not elsewhere classified    Difficult intubation    Dysphagia, oral phase    Dyspnea    Edema    Gallstones    a. Symptomatic - s/p lap chole 05/2018.   History of DVT (deep vein thrombosis)    History of kidney stones    Hypertension    Hypoxemia    Impacted cerumen    Ischemic colitis (HCC)    a. 02/2018 GIB - colonoscopy w/ isch colitis.  Anticoagulation resumed.   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 a. 01/2011 in setting of post-op complications including aspiration pna;  b. CHA2DS2VASc = 4--> Amio/Eliquis .   Pneumonia 03/06/2003   Spinal stenosis, unspecified region other than cervical    Squamous cell carcinoma of skin of trunk, except scrotum    skin cancer of shoulder   Syncope    a. 10/2017-->Event monitor: RSR, rare PACs/PVCs.   Thoracic or lumbosacral neuritis or radiculitis, unspecified    Past Surgical History:  Procedure Laterality Date   CARDIOVERSION N/A 06/20/2017   Procedure: CARDIOVERSION;  Surgeon: Perla Evalene PARAS, MD;  Location: [facility] ORS;  Service: Cardiovascular;  Laterality: N/A;   CATARACT EXTRACTION W/ INTRAOCULAR LENS  IMPLANT, BILATERAL     CERVICAL FUSION  02/10/2011   CHOLECYSTECTOMY  05/24/2018   CHOLECYSTECTOMY N/A 05/24/2018   Procedure: LAPAROSCOPIC CHOLECYSTECTOMY;  Surgeon: Vernetta Berg, MD;  Location: [facility] OR;  Service: General;  Laterality:  N/A;   COLONOSCOPY WITH PROPOFOL  N/A 02/20/2018   Procedure: COLONOSCOPY WITH PROPOFOL ;  Surgeon: Jinny Carmine, MD;  Location: [facility] ENDOSCOPY;  Service: Endoscopy;  Laterality: N/A;   history of abd ultrasound  11/01   fatty liver   MULTIPLE TOOTH EXTRACTIONS     ORIF FIBULA FRACTURE Left 01/06/2017   Procedure: OPEN REDUCTION INTERNAL FIXATION (ORIF) FIBULA FRACTURE DISTAL FIBULA;  Surgeon: Maude Herald, MD;  Location: [facility] OR;  Service: Orthopedics;  Laterality: Left;  Patient states has problems if he will have a tube in throat for Genera; Anesthesia   Social History:  reports that he has quit smoking. His smoking use included cigarettes. He has never used  smokeless tobacco. He reports that he does not drink alcohol and does not use drugs.  Allergies  Allergen Reactions   Morphine And Codeine Shortness Of Breath   Percocet [Oxycodone-Acetaminophen ] Shortness Of Breath   Valium Shortness Of Breath    Family History  Problem Relation Age of Onset   Stroke Father    Atrial fibrillation Brother        on coumadin   Heart disease Brother        AFib- coumadin   Stroke Paternal Grandfather    Other Mother        hemorrhage   Cancer Other        colon cancer at early age   Prostate cancer Neg Hx    Kidney cancer Neg Hx    Bladder Cancer Neg Hx     Prior to Admission medications   Medication Sig Start Date End Date Taking? Authorizing Provider  albuterol  (PROVENTIL ) (2.5 MG/3ML) 0.083% nebulizer solution TAKE 3MLS(1 VIAL) VIA NEBULIZER EVERY 6 HOURS AS NEEDED 10/16/23   Caro Harlene POUR, NP  cetirizine  (ZYRTEC ) 10 MG tablet TAKE 1 TABLET(10 MG) BY MOUTH DAILY 03/22/24   Eubanks, Jessica K, NP  Cholecalciferol  (VITAMIN D3) 1000 units CAPS Take 1,000 Units by mouth daily.    [provider]  doxycycline  (VIBRA -TABS) 100 MG tablet Take 1 tablet (100 mg total) by mouth 2 (two) times daily. 06/07/24   Caro Harlene POUR, NP  ELIQUIS  5 MG TABS tablet TAKE 1 TABLET BY MOUTH TWICE DAILY 04/24/23   Eubanks, Jessica K, NP  empagliflozin  (JARDIANCE ) 10 MG TABS tablet Take 1 tablet (10 mg total) by mouth daily. 05/09/24   Caro Harlene POUR, NP  Fluticasone -Umeclidin-Vilant (TRELEGY ELLIPTA ) 100-62.5-25 MCG/ACT AEPB Inhale 1 puff into the lungs daily. 01/10/24   Assaker, Darrin, MD  furosemide  (LASIX ) 40 MG tablet Take 1 tablet (40 mg total) by mouth daily. 04/13/24   Arrien, Mauricio Daniel, MD  gabapentin  (NEURONTIN ) 300 MG capsule TAKE 1 CAPSULE(300 MG) BY MOUTH TWICE DAILY 03/25/24   Eubanks, Jessica K, NP  guaiFENesin -dextromethorphan  (ROBITUSSIN DM) 100-10 MG/5ML syrup Take 10 mLs by mouth every 4 (four) hours as needed for cough. 09/20/23    Darci Pore, MD  levothyroxine  (SYNTHROID ) 25 MCG tablet TAKE 1 TABLET BY MOUTH EVERY DAY BEFORE BREAKFAST AS DIRECTED 03/22/24   Caro Harlene POUR, NP  OXYGEN  Inhale 4 L/min into the lungs at bedtime.    [provider]  pravastatin  (PRAVACHOL ) 40 MG tablet TAKE 1 TABLET BY MOUTH EVERY DAY 04/03/24   Eubanks, Jessica K, NP  spironolactone  (ALDACTONE ) 25 MG tablet Take 0.5 tablets (12.5 mg total) by mouth daily. 05/09/24   Caro Harlene POUR, NP    Physical Exam: Vitals:   06/19/24 2315 06/19/24 2330 06/20/24 0015 06/20/24 0045  BP: ROLLEN)  120/57 123/67 (!) 104/59 121/61  Pulse: 80 87 77 95  Resp: (!) 23 (!) 31 (!) 28 (!) 30  Temp:      TempSrc:      SpO2: 100% 100% 99% 99%  Weight:      Height:       Physical Exam:  General: No acute distress, well developed, well nourished HEENT: Normocephalic, atraumatic, PERRL Cardiovascular: Normal rate and rhythm. Distal pulse not palpable on left foot, 2+ dp pulse on left foot  Pulmonary:   right lower ext is hot and tender to touch.  It is slightly red. Gastrointestinal: Nondistended, obese abdomen, soft, non-tender, normoactive bowel sounds Musculoskeletal:Normal ROM, no lower ext edema Skin: Skin is warm and dry. Neuro: No focal deficits noted, AAOx3. PSYCH: Attentive and cooperative  Data Reviewed:  Results for orders placed or performed during the hospital encounter of 06/19/24 (from the past 24 hours)  CBC with Differential     Status: Abnormal   Collection Time: 06/19/24 10:05 PM  Result Value Ref Range   WBC 13.1 (H) 4.0 - 10.5 K/uL   RBC 5.30 4.22 - 5.81 MIL/uL   Hemoglobin 16.0 13.0 - 17.0 g/dL   HCT 50.2 60.9 - 47.9 %   MCV 93.8 80.0 - 100.0 fL   MCH 30.2 26.0 - 34.0 pg   MCHC 32.2 30.0 - 36.0 g/dL   RDW 84.0 (H) 88.4 - 84.4 %   Platelets 222 150 - 400 K/uL   nRBC 0.0 0.0 - 0.2 %   Neutrophils Relative % 76 %   Neutro Abs 10.1 (H) 1.7 - 7.7 K/uL   Lymphocytes Relative 16 %   Lymphs Abs 2.2 0.7 - 4.0  K/uL   Monocytes Relative 5 %   Monocytes Absolute 0.6 0.1 - 1.0 K/uL   Eosinophils Relative 1 %   Eosinophils Absolute 0.1 0.0 - 0.5 K/uL   Basophils Relative 1 %   Basophils Absolute 0.1 0.0 - 0.1 K/uL   Immature Granulocytes 1 %   Abs Immature Granulocytes 0.06 0.00 - 0.07 K/uL  Comprehensive metabolic panel     Status: Abnormal   Collection Time: 06/19/24 10:05 PM  Result Value Ref Range   Sodium 137 135 - 145 mmol/L   Potassium 4.3 3.5 - 5.1 mmol/L   Chloride 102 98 - 111 mmol/L   CO2 25 22 - 32 mmol/L   Glucose, Bld 92 70 - 99 mg/dL   BUN 29 (H) 8 - 23 mg/dL   Creatinine, Ser 8.70 (H) 0.61 - 1.24 mg/dL   Calcium 9.5 8.9 - 89.6 mg/dL   Total Protein 8.0 6.5 - 8.1 g/dL   Albumin 4.2 3.5 - 5.0 g/dL   AST 42 (H) 15 - 41 U/L   ALT 40 0 - 44 U/L   Alkaline Phosphatase 61 38 - 126 U/L   Total Bilirubin 1.7 (H) 0.0 - 1.2 mg/dL   GFR, Estimated 53 (L) >60 mL/min   Anion gap 10 5 - 15  I-Stat CG4 Lactic Acid     Status: None   Collection Time: 06/19/24 10:36 PM  Result Value Ref Range   Lactic Acid, Venous 1.6 0.5 - 1.9 mmol/L        Assessment and Plan: Right lower leg cellulitis - IV Rocephin  per cellulitis protocol  2. Volume overload - Gentle lasix .  The patient reports that his kidneys were harmed with diuresis last admission.  3.DMT2 - Corrective dosed insulin . Continue Jardiance .  4. Chronic respiratory failure -  He wears 4L O2 [place] at baseline nightly.  5. H/o chronic rt lower ext DVT - continue Eliquis    Advance Care Planning:   Code Status: Prior  The patient wants to be DNR and names his daughter-in-law whose name is Donzell at sisters surrogate Management consultant as she is a Engineer, civil (consulting).  Consults: none  Family Communication: none  Severity of Illness: The appropriate patient status for this patient is INPATIENT. Inpatient status is judged to be reasonable and necessary in order to provide the required intensity of service to ensure the patient's safety. The  patient's presenting symptoms, physical exam findings, and initial radiographic and laboratory data in the context of their chronic comorbidities is felt to place them at high risk for further clinical deterioration. Furthermore, it is not anticipated that the patient will be medically stable for discharge from the hospital within 2 midnights of admission.   * I certify that at the point of admission it is my clinical judgment that the patient will require inpatient hospital care spanning beyond 2 midnights from the point of admission due to high intensity of service, high risk for further deterioration and high frequency of surveillance required.*  Author: ARTHEA CHILD, MD 06/20/2024 2:03 AM  For on call review [email or web address].

## 2024-06-20 NOTE — Progress Notes (Signed)
 PT Cancellation Note  Patient Details Name: Terin Cragle Ream MRN: [id number] DOB: [birth year]   Cancelled Treatment:    Reason Eval/Treat Not Completed: Pain limiting ability to participate;Patient declined, no reason specified (PT consult appreciated and chart reviewed. Introduced self to pt and explained the purpose of a PT evaluation. He refused session reporting 6/10 RLE pain at rest and stating he would be unable to tolerate any movement. Will follow-up as schedule permits.)  Randall SAUNDERS, PT, DPT Acute Rehabilitation Services Office: [phone] Secure Chat Preferred  [name] Callander 06/20/2024, 4:01 PM

## 2024-06-21 ENCOUNTER — Other Ambulatory Visit (HOSPITAL_COMMUNITY): Payer: Self-pay

## 2024-06-21 ENCOUNTER — Other Ambulatory Visit: Payer: Self-pay | Admitting: Nurse Practitioner

## 2024-06-21 DIAGNOSIS — I5032 Chronic diastolic (congestive) heart failure: Secondary | ICD-10-CM | POA: Diagnosis not present

## 2024-06-21 DIAGNOSIS — G8929 Other chronic pain: Secondary | ICD-10-CM

## 2024-06-21 DIAGNOSIS — E039 Hypothyroidism, unspecified: Secondary | ICD-10-CM | POA: Diagnosis not present

## 2024-06-21 DIAGNOSIS — L03115 Cellulitis of right lower limb: Secondary | ICD-10-CM | POA: Diagnosis not present

## 2024-06-21 DIAGNOSIS — Z7901 Long term (current) use of anticoagulants: Secondary | ICD-10-CM | POA: Diagnosis not present

## 2024-06-21 LAB — BASIC METABOLIC PANEL WITH GFR
Anion gap: 9 (ref 5–15)
BUN: 26 mg/dL — ABNORMAL HIGH (ref 8–23)
CO2: 27 mmol/L (ref 22–32)
Calcium: 8.8 mg/dL — ABNORMAL LOW (ref 8.9–10.3)
Chloride: 103 mmol/L (ref 98–111)
Creatinine, Ser: 1.49 mg/dL — ABNORMAL HIGH (ref 0.61–1.24)
GFR, Estimated: 44 mL/min — ABNORMAL LOW (ref >60)
Glucose, Bld: 93 mg/dL (ref 70–99)
Potassium: 4.2 mmol/L (ref 3.5–5.1)
Sodium: 139 mmol/L (ref 135–145)

## 2024-06-21 LAB — CBC
HCT: 42.7 % (ref 39.0–52.0)
Hemoglobin: 13.5 g/dL (ref 13.0–17.0)
MCH: 29.7 pg (ref 26.0–34.0)
MCHC: 31.6 g/dL (ref 30.0–36.0)
MCV: 93.8 fL (ref 80.0–100.0)
Platelets: 173 K/uL (ref 150–400)
RBC: 4.55 MIL/uL (ref 4.22–5.81)
RDW: 16.6 % — ABNORMAL HIGH (ref 11.5–15.5)
WBC: 9.1 K/uL (ref 4.0–10.5)
nRBC: 0 % (ref 0.0–0.2)

## 2024-06-21 MED ORDER — CEPHALEXIN 500 MG PO CAPS
500.0000 mg | ORAL_CAPSULE | Freq: Three times a day (TID) | ORAL | 0 refills | Status: AC
Start: 2024-06-21 — End: 2024-06-26
  Filled 2024-06-21: qty 15, 5d supply, fill #0

## 2024-06-21 NOTE — Progress Notes (Signed)
 SLP Cancellation Note  Patient Details Name: Eric [name] Severe MRN: [id number] DOB: Douse 02, 1933   Cancelled treatment:       Reason Eval/Treat Not Completed: Patient declined, no reason specified (Chart review completed. Pt known to SLP services with most recent MBSS 09/15/23. Pt politely declined further SLP evaluation. Pt well-versed  in diet recommendations and safe swallowing/aspiration and reflux precautions. Daughter at bedside in agreement. RN also made aware via Secure Chat.)  Time spent: 9083-9071  Delon Bangs, M.S., CCC-SLP Speech-Language Pathologist Secure Chat Preferred  O: [phone]  [name] Bangs 06/21/2024, 9:31 AM

## 2024-06-21 NOTE — Evaluation (Signed)
 Physical Therapy Evaluation Patient Details Name: Eric [name] MRN: [id number] DOB: 1932/08/09 Today's Date: 06/21/2024  History of Present Illness  Patient is 89 y.o. male admitted 06/19/24 for BLE pain and swelling. Found to have right leg cellulitis. PMHx: CHF, COPD, T2DM, CKD stage IIIa, HTN, hypothyroid, HLD, and DVT.  Clinical Impression  Pt admitted with above diagnosis. PTA, pt was modI for functional mobility using a RW and independent with ADLs. He lives with his wife in a two story house with a ramp entrance. He is able to reside on the main level. Pt has a strong family support available. Pt currently with functional limitations due to the deficits listed below (see PT Problem List). He performed bed mobility with supervision, transfers using RW with CGA, and gait using RW with CGA and a chair follow. He required cues for sequencing and increased safety awareness. Pt demonstrated unsteadiness during dynamic activities and is considered a high fall risk. Pt will benefit from acute skilled PT to increase his independence and safety with mobility to allow discharge. Recommend continue HHPT to increase strength, improve activity tolerance, decrease fall risk, and optimize safety within the home environment.        If plan is discharge home, recommend the following: A little help with walking and/or transfers;A little help with bathing/dressing/bathroom;Assistance with cooking/housework;Assist for transportation;Help with stairs or ramp for entrance   Can travel by private vehicle        Equipment Recommendations None recommended by PT (Pt already has DME)  Recommendations for Other Services       Functional Status Assessment Patient has had a recent decline in their functional status and demonstrates the ability to make significant improvements in function in a reasonable and predictable amount of time.     Precautions / Restrictions Precautions Precautions: Fall Recall of  Precautions/Restrictions: Intact Restrictions Weight Bearing Restrictions Per Provider Order: No      Mobility  Bed Mobility Overal bed mobility: Needs Assistance Bed Mobility: Supine to Sit     Supine to sit: Supervision, HOB elevated     General bed mobility comments: Pt sat up on R side of bed with increased time. Supervision for safety.    Transfers Overall transfer level: Needs assistance Equipment used: Rolling walker (2 wheels) Transfers: Sit to/from Stand, Bed to chair/wheelchair/BSC Sit to Stand: Contact guard assist   Step pivot transfers: Contact guard assist       General transfer comment: Pt stood from lowest bed height. Cued proper hand placement using RW. Powered up without physical assist. CGA for safety and sequencing. Transferred to [facility] on his R twice. Good eccentric control.    Ambulation/Gait Ambulation/Gait assistance: Contact guard assist, +2 safety/equipment (chair follow (not needed)) Gait Distance (Feet): 100 Feet Assistive device: Rolling walker (2 wheels) Gait Pattern/deviations: Step-through pattern, Decreased step length - right, Decreased step length - left, Decreased stride length, Decreased dorsiflexion - right, Trunk flexed Gait velocity: reduced Gait velocity interpretation: <1.8 ft/sec, indicate of risk for recurrent falls   General Gait Details: Pt ambulated with short steps and a fwd flex posture. Cues for proximity to RW. pt able to correct with VC, but quickly return to his preferred positioning. He kept RLE in ER and lacked adequate DF, dragging toe across the floor. Cued foot clearence for increased safety awareness. No LOB.  Stairs            Wheelchair Mobility     Tilt Bed    Modified Rankin (Stroke Patients  Only)       Balance Overall balance assessment: Needs assistance Sitting-balance support: Single extremity supported, No upper extremity supported, Feet supported Sitting balance-Leahy Scale: Fair      Standing balance support: Bilateral upper extremity supported, During functional activity, Reliant on assistive device for balance Standing balance-Leahy Scale: Poor Standing balance comment: Pt dependent on RW.                             Pertinent Vitals/Pain Pain Assessment Pain Assessment: Faces Faces Pain Scale: Hurts a little bit Pain Location: R LE Pain Descriptors / Indicators: Discomfort, Aching, Sore Pain Intervention(s): Monitored during session, Repositioned    Home Living Family/patient expects to be discharged to:: Private residence Living Arrangements: Spouse/significant other Available Help at Discharge: Family;Available 24 hours/day;Available PRN/intermittently (son, daughter-in-law, and daughter live nearby and can assist) Type of Home: House Home Access: Ramped entrance     Alternate Level Stairs-Number of Steps: main level + basement Home Layout: Two level;Able to live on main level with bedroom/bathroom (Has a basement he doesn't need to go into.) Home Equipment: Rolling Walker (2 wheels);Rollator (4 wheels);Lift chair;Shower seat Additional Comments: Pt reports recieving HH PT/OT services    Prior Function Prior Level of Function : Independent/Modified Independent             Mobility Comments: Pt sleeps in lift chair. ModI using RW. Fall ~1 week ago ADLs Comments: Ind with ADLs. Manages own medications. Pt is able to make light meals; receives Meals on Wheels. Family assists with IADLs as needed     Extremity/Trunk Assessment   Upper Extremity Assessment Upper Extremity Assessment: Defer to OT evaluation RUE Deficits / Details: impaired shouler ROM at baseline; impaired sensation at baseline, generalized weakness; impaired gross motor coordination at baseline RUE Sensation: history of peripheral neuropathy RUE Coordination: decreased gross motor LUE Deficits / Details: impaired shouler ROM at baseline; impaired sensation at baseline,  generalized weakness; impaired fine motor coordination at baseline LUE Sensation: history of peripheral neuropathy LUE Coordination: decreased fine motor    Lower Extremity Assessment Lower Extremity Assessment: Generalized weakness    Cervical / Trunk Assessment Cervical / Trunk Assessment: Kyphotic  Communication   Communication Communication: Impaired Factors Affecting Communication: Hearing impaired    Cognition Arousal: Alert Behavior During Therapy: WFL for tasks assessed/performed   PT - Cognitive impairments: No apparent impairments                       PT - Cognition Comments: Pt A,Ox4. Tangential in conversation, sharing many stories. Required redirection to focus on tasks. Following commands: Intact       Cueing Cueing Techniques: Verbal cues     General Comments General comments (skin integrity, edema, etc.): Pt's daughter present and supportive throughout session.    Exercises     Assessment/Plan    PT Assessment Patient needs continued PT services  PT Problem List Decreased strength;Decreased activity tolerance;Decreased balance;Decreased mobility;Decreased knowledge of use of DME;Decreased safety awareness       PT Treatment Interventions DME instruction;Gait training;Functional mobility training;Therapeutic activities;Therapeutic exercise;Balance training;Patient/family education    PT Goals (Current goals can be found in the Care Plan section)  Acute Rehab PT Goals Patient Stated Goal: Return Home and keep recieving therapy PT Goal Formulation: With patient/family Time For Goal Achievement: 06/28/24 Potential to Achieve Goals: Good    Frequency Min 1X/week     Co-evaluation  AM-PAC PT 6 Clicks Mobility  Outcome Measure Help needed turning from your back to your side while in a flat bed without using bedrails?: A Little Help needed moving from lying on your back to sitting on the side of a flat bed without  using bedrails?: A Little Help needed moving to and from a bed to a chair (including a wheelchair)?: A Little Help needed standing up from a chair using your arms (e.g., wheelchair or bedside chair)?: A Little Help needed to walk in hospital room?: A Little Help needed climbing 3-5 steps with a railing? : A Lot 6 Click Score: 17    End of Session Equipment Utilized During Treatment: Gait belt Activity Tolerance: Patient tolerated treatment well Patient left: with call bell/phone within reach;with family/visitor present;Other (comment) (on [facility]) Nurse Communication: Mobility status;Other (comment) (pt positioning) PT Visit Diagnosis: Difficulty in walking, not elsewhere classified (R26.2);Unsteadiness on feet (R26.81);Other abnormalities of gait and mobility (R26.89);Muscle weakness (generalized) (M62.81)    Time: 8996-8958 PT Time Calculation (min) (ACUTE ONLY): 38 min   Charges:   PT Evaluation $PT Eval Low Complexity: 1 Low PT Treatments $Gait Training: 8-22 mins PT General Charges $$ ACUTE PT VISIT: 1 Visit         Randall SAUNDERS, PT, DPT Acute Rehabilitation Services Office: [phone] Secure Chat Preferred  [name] Callander 06/21/2024, 12:27 PM

## 2024-06-21 NOTE — Discharge Summary (Signed)
 Physician Discharge Summary   Patient: Eric [name] MRN: [id number] DOB: Mar 05, 1932  Admit date:     06/19/2024  Discharge date: 06/21/24  Discharge Physician: Concepcion Riser   PCP: Caro Harlene POUR, NP   Recommendations at discharge:   PCP follow up in 1 week. Antibiotic regimen for 5 days ordered.  Discharge Diagnoses: Principal Problem:   Cellulitis Active Problems:   PAF (paroxysmal atrial fibrillation) (HCC)   Hypothyroidism   Long term current use of anticoagulant   Diastolic CHF, chronic (HCC)  Resolved Problems:   * No resolved hospital problems. *  Hospital Course: Eric [name] is a 89 y.o. male with medical history significant for congestive heart failure, paroxysmal atrial fibrillation on Eliquis , DMT2, CKD stage IIIa, hypertension, hypothyroid, and hyperlipidemia who presents with complaints of pain in his legs.  The patient says the pain started fairly abruptly at 7:30 PM.  He denies chest pain or shortness of breath. He has swelling in legs, right greater than left all the time. Patient is admitted to TR[facility] service for right leg cellulitis.  Assessment and Plan: Right leg cellulitis- Patient was started on Rocephin , his swelling, redness improved, WBC trended down. Pain better, able to ambulate with PT. Patient wants to be discharged home as he has a party to attend. States that he follows with PCP next week. Keflex  for 5 more days prescribed. He did walk with PT advised HHPT.   H/o right leg DVT- on eliquis  therapy.  CKD stage 3a- kidney function stable.  Diastolic CHF- continued on home dose Lasix . He seems euvolemic. Continue Aldactone , Jardiance .  Hypothyroid- continue home dose synthroid .  Chronic respiratory failure- He wears 4L oxygen  at night. No shortness of breath. He has oxygen  supply at home.       Consultants: none Procedures performed: none  Disposition: Home health Diet recommendation:  Discharge Diet Orders (From admission, onward)      Start     Ordered   06/21/24 0000  Diet - low sodium heart healthy        06/21/24 1306           Cardiac diet DISCHARGE MEDICATION: Allergies as of 06/21/2024       Reactions   Morphine And Codeine Shortness Of Breath   Percocet [oxycodone-acetaminophen ] Shortness Of Breath   Valium Shortness Of Breath        Medication List     STOP taking these medications    ibuprofen  200 MG tablet Commonly known as: ADVIL        TAKE these medications    albuterol  (2.5 MG/3ML) 0.083% nebulizer solution Commonly known as: PROVENTIL  TAKE 3MLS(1 VIAL) VIA NEBULIZER EVERY 6 HOURS AS NEEDED   cephALEXin  500 MG capsule Commonly known as: KEFLEX  Take 1 capsule (500 mg total) by mouth 3 (three) times daily for 5 days.   cetirizine  10 MG tablet Commonly known as: ZYRTEC  TAKE 1 TABLET(10 MG) BY MOUTH DAILY   Eliquis  5 MG Tabs tablet Generic drug: apixaban  TAKE 1 TABLET BY MOUTH TWICE DAILY   empagliflozin  10 MG Tabs tablet Commonly known as: JARDIANCE  Take 1 tablet (10 mg total) by mouth daily.   furosemide  40 MG tablet Commonly known as: LASIX  Take 1 tablet (40 mg total) by mouth daily.   gabapentin  300 MG capsule Commonly known as: NEURONTIN  TAKE 1 CAPSULE(300 MG) BY MOUTH TWICE DAILY   levothyroxine  25 MCG tablet Commonly known as: SYNTHROID  TAKE 1 TABLET BY MOUTH EVERY DAY BEFORE BREAKFAST AS DIRECTED  OXYGEN  Inhale 4 L/min into the lungs at bedtime.   pravastatin  40 MG tablet Commonly known as: PRAVACHOL  TAKE 1 TABLET BY MOUTH EVERY DAY What changed: when to take this   spironolactone  25 MG tablet Commonly known as: ALDACTONE  Take 0.5 tablets (12.5 mg total) by mouth daily.   Trelegy Ellipta  100-62.5-25 MCG/ACT Aepb Generic drug: Fluticasone -Umeclidin-Vilant Inhale 1 puff into the lungs daily.   Vitamin D3 1000 units Caps Take 1,000 Units by mouth daily. Take one capsule (1000units) by mouth day.        Discharge Exam: Filed Weights    06/19/24 2211  Weight: 78.5 kg      06/21/2024    2:56 PM 06/21/2024    8:15 AM 06/21/2024    3:54 AM  Vitals with BMI  Systolic 127 107 94  Diastolic 62 89 51  Pulse 76 64 93    General - Elderly Caucasian male, no apparent distress HEENT - PERRLA, EOMI, atraumatic head, non tender sinuses. Lung - Clear, basal rales, no rhonchi, wheezes. Heart - S1, S2 heard, no murmurs, rubs, trace pedal edema. Abdomen - Soft, non tender, bowel sounds good Neuro - Alert, awake and oriented x 3, non focal exam. Skin - Warm and dry. Right leg redness, warmth, tenderness.  Condition at discharge: stable  The results of significant diagnostics from this hospitalization (including imaging, microbiology, ancillary and laboratory) are listed below for reference.   Imaging Studies: DG Chest Port 1 View Result Date: 06/19/2024 CLINICAL DATA:  Weakness EXAM: PORTABLE CHEST 1 VIEW COMPARISON:  04/08/2024 FINDINGS: Hardware in the cervical spine. Low lung volumes. Cardiomegaly with slight central bronchial vascular crowding. Linear atelectasis in the right lower lung. No consolidation, pleural effusion or pneumothorax IMPRESSION: Low lung volumes with cardiomegaly and right lower lung atelectasis. Electronically Signed   By: Luke Bun M.D.   On: 06/19/2024 22:33    Microbiology: Results for orders placed or performed during the hospital encounter of 06/19/24  Blood culture (routine x 2)     Status: None (Preliminary result)   Collection Time: 06/19/24 10:00 PM   Specimen: BLOOD RIGHT HAND  Result Value Ref Range Status   Specimen Description BLOOD RIGHT HAND  Final   Special Requests   Final    BOTTLES DRAWN AEROBIC AND ANAEROBIC Blood Culture adequate volume   Culture   Final    NO GROWTH 2 DAYS Performed at [facility], [address], [place], [place] [address]    Report Status PENDING  Incomplete  Blood culture (routine x 2)     Status: None (Preliminary result)   Collection Time:  06/19/24 10:30 PM   Specimen: BLOOD  Result Value Ref Range Status   Specimen Description BLOOD RIGHT ANTECUBITAL  Final   Special Requests   Final    BOTTLES DRAWN AEROBIC AND ANAEROBIC Blood Culture adequate volume   Culture   Final    NO GROWTH 2 DAYS Performed at [facility], [address], [place], [place] [address]    Report Status PENDING  Incomplete    Labs: CBC: Recent Labs  Lab 06/19/24 2205 06/20/24 0700 06/21/24 0613  WBC 13.1* 15.3* 9.1  NEUTROABS 10.1*  --   --   HGB 16.0 14.6 13.5  HCT 49.7 44.6 42.7  MCV 93.8 93.3 93.8  PLT 222 201 173   Basic Metabolic Panel: Recent Labs  Lab 06/19/24 2205 06/20/24 0700 06/21/24 0613  NA 137 136 139  K 4.3 4.4 4.2  CL 102 102 103  CO2 25 23 27   GLUCOSE 92 107* 93  BUN 29* 27* 26*  CREATININE 1.29* 1.37* 1.49*  CALCIUM 9.5 9.0 8.8*   Liver Function Tests: Recent Labs  Lab 06/19/24 2205  AST 42*  ALT 40  ALKPHOS 61  BILITOT 1.7*  PROT 8.0  ALBUMIN 4.2   CBG: Recent Labs  Lab 06/20/24 1209  GLUCAP 133*    Discharge time spent: 35 minutes.  Signed: Concepcion Riser, MD [facility] 06/21/2024

## 2024-06-21 NOTE — Plan of Care (Signed)

## 2024-06-21 NOTE — Progress Notes (Signed)
 Reviewed AVS, patient expressed understanding of medications, MD follow up reviewed.   Removed IV, Site clean, dry and intact.  Patient states all belongings brought to the hospital at time of admission are accounted for and packed to take home.  Picked up medications from [facility] pharmacy. Pt transported to entrance A where family member was waiting in vehicle to transport home.

## 2024-06-21 NOTE — Evaluation (Signed)
 Occupational Therapy Evaluation Patient Details Name: Eric [name] MRN: [id number] DOB: 06-Jun-1932 Today's Date: 06/21/2024   History of Present Illness   Patient is 89 y.o. male admitted 06/19/24 for BLE pain and swelling. Found to have right leg cellulitis. PMHx: CHF, COPD, T2DM, CKD stage IIIa, HTN, hypothyroid, HLD, and DVT.     Clinical Impressions At baseline, pt is Ind with ADLs, light meal prep, and medication management, and performs functional mobility with a RW with Mod I. At baseline, pt receives assistance from family for IADLs as needed. Pt now present with decreased activity tolerance, decreased B UE strength, decreased balance, and decreased safety and independence with functional tasks. Pt currently demonstrates ability to complete ADLs largely with Mod I to Contact guard assist and perform functional mobility with a RW with Contact guard assist. Pt participated well in session and has good family support. Pt will benefit from acute skilled OT services to address deficits outlined below and increase safety and independence with functional tasks. Post acute discharge, pt will benefit from [facility] OT to maximize rehab potential.      If plan is discharge home, recommend the following:   A little help with walking and/or transfers;A little help with bathing/dressing/bathroom;Assistance with cooking/housework;Assist for transportation;Help with stairs or ramp for entrance     Functional Status Assessment   Patient has had a recent decline in their functional status and demonstrates the ability to make significant improvements in function in a reasonable and predictable amount of time.     Equipment Recommendations   None recommended by OT (Pt already has needed equipment)     Recommendations for Other Services         Precautions/Restrictions   Precautions Precautions: Fall Restrictions Weight Bearing Restrictions Per Provider Order: No     Mobility Bed  Mobility Overal bed mobility: Needs Assistance Bed Mobility: Supine to Sit     Supine to sit: Supervision     General bed mobility comments: Supervision for safety    Transfers Overall transfer level: Needs assistance Equipment used: Rolling walker (2 wheels) Transfers: Sit to/from Stand, Bed to chair/wheelchair/BSC Sit to Stand: Contact guard assist     Step pivot transfers: Contact guard assist     General transfer comment: CGA for safety; cues for safety, hand placement, and to keep RW close to him      Balance Overall balance assessment: Needs assistance Sitting-balance support: Single extremity supported, No upper extremity supported, Feet supported Sitting balance-Leahy Scale: Fair     Standing balance support: Single extremity supported, Bilateral upper extremity supported, During functional activity, Reliant on assistive device for balance Standing balance-Leahy Scale: Poor Standing balance comment: reliant on UE support for balance                           ADL either performed or assessed with clinical judgement   ADL Overall ADL's : Needs assistance/impaired Eating/Feeding: Modified independent;Sitting   Grooming: Contact guard assist;Standing   Upper Body Bathing: Supervision/ safety;Contact guard assist;Sitting   Lower Body Bathing: Contact guard assist;Sit to/from stand   Upper Body Dressing : Supervision/safety;Contact guard assist;Sitting   Lower Body Dressing: Contact guard assist;Minimal assistance;Sit to/from stand Lower Body Dressing Details (indicate cue type and reason): pt reports he does not usually wear socks at home and typically wears slip-on crocs Toilet Transfer: Contact guard assist;Rolling walker (2 wheels);BSC/3in1;Cueing for safety   Toileting- Clothing Manipulation and Hygiene: Contact guard assist;Sitting/lateral lean;Sit to/from  stand       Functional mobility during ADLs: Contact guard assist;Rolling walker (2  wheels);Cueing for safety       Vision Baseline Vision/History: 1 Wears glasses (bifocals) Ability to See in Adequate Light: 0 Adequate (with glasses) Patient Visual Report: No change from baseline Vision Assessment?: No apparent visual deficits Additional Comments: WFL for tasks assessed, not formally screened     Perception         Praxis         Pertinent Vitals/Pain Pain Assessment Pain Assessment: Faces Faces Pain Scale: Hurts a little bit Pain Location: R LE Pain Descriptors / Indicators: Discomfort, Aching, Sore Pain Intervention(s): Limited activity within patient's tolerance, Monitored during session     Extremity/Trunk Assessment Upper Extremity Assessment Upper Extremity Assessment: Left hand dominant;Right hand dominant;Generalized weakness;RUE deficits/detail;LUE deficits/detail (Naturally Left handed, but often uses R hand now due to decreased sensation and decreased fine motor coordinaiton in L hand) RUE Deficits / Details: impaired shouler ROM at baseline; impaired sensation at baseline, generalized weakness; impaired gross motor coordination at baseline RUE Sensation: history of peripheral neuropathy RUE Coordination: decreased gross motor LUE Deficits / Details: impaired shouler ROM at baseline; impaired sensation at baseline, generalized weakness; impaired fine motor coordination at baseline LUE Sensation: history of peripheral neuropathy LUE Coordination: decreased fine motor   Lower Extremity Assessment Lower Extremity Assessment: Defer to PT evaluation   Cervical / Trunk Assessment Cervical / Trunk Assessment: Kyphotic   Communication Communication Communication: Impaired Factors Affecting Communication: Hearing impaired (mild)   Cognition Arousal: Alert Behavior During Therapy: WFL for tasks assessed/performed Cognition: No apparent impairments             OT - Cognition Comments: AAOx4 and pleasant throughout session. Cognition WFL for  tasks assessed. Cognition not formally screened or evaluated                 Following commands: Intact       Cueing  General Comments   Cueing Techniques: Verbal cues  VSS on RA. Pt's daughter present and supportive during session. RN present during a portion of session   Exercises     Shoulder Instructions      Home Living Family/patient expects to be discharged to:: Private residence Living Arrangements: Spouse/significant other Available Help at Discharge: Family;Available 24 hours/day (son, daughter-in-law, and daughter live nearby and can assist) Type of Home: House Home Access: Ramped entrance     Home Layout: Multi-level;Able to live on main level with bedroom/bathroom Alternate Level Stairs-Number of Steps: main level + basement   Bathroom Shower/Tub: Tub/shower unit   Bathroom Toilet: Handicapped height     Home Equipment: Agricultural consultant (2 wheels);Rollator (4 wheels);Lift chair;Shower seat   Additional Comments: sleeps in lift chair      Prior Functioning/Environment Prior Level of Function : Independent/Modified Independent;Needs assist             Mobility Comments: Mod I with a RW; fall approx. 1  week ago ADLs Comments: Ind with ADLs and medication management; able to make light meals and receives Meals on Wheels; family assists with IADLs as needed    OT Problem List: Decreased strength;Decreased activity tolerance;Impaired balance (sitting and/or standing);Decreased knowledge of use of DME or AE   OT Treatment/Interventions: Self-care/ADL training;Therapeutic exercise;DME and/or AE instruction;Energy conservation;Therapeutic activities;Patient/family education;Balance training      OT Goals(Current goals can be found in the care plan section)   Acute Rehab OT Goals Patient Stated Goal: to return home  OT Goal Formulation: With patient/family Time For Goal Achievement: 07/05/24 Potential to Achieve Goals: Good ADL Goals Pt Will  Perform Grooming: with modified independence;standing Pt Will Perform Lower Body Bathing: with modified independence;sitting/lateral leans;sit to/from stand (with adaptive equipment as needed) Pt Will Perform Lower Body Dressing: with modified independence;sitting/lateral leans;sit to/from stand (with adaptive equipment as needed) Pt Will Transfer to Toilet: with modified independence;ambulating;regular height toilet (with least restrictive AD) Pt Will Perform Toileting - Clothing Manipulation and hygiene: with modified independence;sit to/from stand;sitting/lateral leans   OT Frequency:  Min 1X/week    Co-evaluation              AM-PAC OT 6 Clicks Daily Activity     Outcome Measure Help from another person eating meals?: None Help from another person taking care of personal grooming?: A Little Help from another person toileting, which includes using toliet, bedpan, or urinal?: A Little Help from another person bathing (including washing, rinsing, drying)?: A Little Help from another person to put on and taking off regular upper body clothing?: A Little Help from another person to put on and taking off regular lower body clothing?: A Little 6 Click Score: 19   End of Session Equipment Utilized During Treatment: Gait belt;Rolling walker (2 wheels) Nurse Communication: Mobility status;Other (comment) (Pt sitting on BSC at end of session with call bell in reach and daughter present)  Activity Tolerance: Patient tolerated treatment well Patient left: Other (comment);with call bell/phone within reach;with family/visitor present (sitting on BSC with RN and NT notified)  OT Visit Diagnosis: Unsteadiness on feet (R26.81);History of falling (Z91.81);Muscle weakness (generalized) (M62.81)                Time: 8995-8958 OT Time Calculation (min): 37 min Charges:  OT General Charges $OT Visit: 1 Visit OT Evaluation $OT Eval Low Complexity: 1 Low $Self Care/Home Management: 8-22  mins  Margarie Rockey HERO., OTR/L, MA Acute Rehab [phone]   Margarie FORBES Horns 06/21/2024, 11:06 AM

## 2024-06-21 NOTE — TOC Transition Note (Addendum)
 Transition of Care [facility]) - Discharge Note   Patient Details  Name: Eric [name] MRN: [id number] Date of Birth: [birth year]  Transition of Care [facility]) CM/SW Contact:  Rosalva Jon Bloch, RN Phone Number: 06/21/2024, 2:23 PM   Clinical Narrative:    Patient will DC to: home Anticipated DC date: 06/21/2024 Family notified: yes Transport by: car    - R leg cellulitis Per MD patient ready for DC today. RN, patient, patient's family, and Burnard Boros [facility] notified of DC.  Per PT regarding DME: None recommended by PT (Pt already has DME). Pt without RX med concerns. Post hospital f/u noted on AVS. Son to provide transportation to home.  RNCM will sign off for now as intervention is no longer needed. Please consult us  again if new needs arise.   Final next level of care: Home w Home Health Services Barriers to Discharge: No Barriers Identified   Patient Goals and CMS Choice     Choice offered to / list presented to : Patient      Discharge Placement                       Discharge Plan and Services Additional resources added to the After Visit Summary for                            [facility] Arranged: PT, OT HH Agency: [facility] (resumption of care) Date [facility] Agency Contacted: 06/21/24 Time HH Agency Contacted: 1422 Representative spoke with at [facility] Agency: Burnard  Social Drivers of Health (SDOH) Interventions SDOH Screenings   Food Insecurity: No Food Insecurity (06/20/2024)  Housing: Low Risk  (06/20/2024)  Transportation Needs: No Transportation Needs (06/20/2024)  Utilities: Not At Risk (06/20/2024)  Alcohol Screen: Low Risk  (08/20/2018)  Depression (PHQ2-9): Low Risk  (03/31/2023)  Financial Resource Strain: Low Risk  (03/09/2018)  Physical Activity: Insufficiently Active (03/09/2018)  Social Connections: Socially Integrated (06/20/2024)  Stress: No Stress Concern Present (03/09/2018)  Tobacco Use: Medium Risk (06/19/2024)     Readmission Risk  Interventions    04/09/2024    2:54 PM 09/20/2023   11:48 AM  Readmission Risk Prevention Plan  Post Dischage Appt  Complete  Medication Screening  Complete  Transportation Screening Complete Complete  PCP or Specialist Appt within 5-7 Days Complete   Home Care Screening Complete   Medication Review (RN CM) Complete

## 2024-06-24 LAB — CULTURE, BLOOD (ROUTINE X 2)
Culture: NO GROWTH
Culture: NO GROWTH
Special Requests: ADEQUATE
Special Requests: ADEQUATE

## 2024-07-01 DIAGNOSIS — J441 Chronic obstructive pulmonary disease with (acute) exacerbation: Secondary | ICD-10-CM | POA: Diagnosis not present

## 2024-07-01 DIAGNOSIS — Z7901 Long term (current) use of anticoagulants: Secondary | ICD-10-CM

## 2024-07-01 DIAGNOSIS — I700 Atherosclerosis of aorta: Secondary | ICD-10-CM | POA: Diagnosis not present

## 2024-07-01 DIAGNOSIS — M797 Fibromyalgia: Secondary | ICD-10-CM

## 2024-07-01 DIAGNOSIS — J9601 Acute respiratory failure with hypoxia: Secondary | ICD-10-CM | POA: Diagnosis not present

## 2024-07-01 DIAGNOSIS — Z9981 Dependence on supplemental oxygen: Secondary | ICD-10-CM

## 2024-07-01 DIAGNOSIS — N1831 Chronic kidney disease, stage 3a: Secondary | ICD-10-CM | POA: Diagnosis not present

## 2024-07-01 DIAGNOSIS — Z87442 Personal history of urinary calculi: Secondary | ICD-10-CM

## 2024-07-01 DIAGNOSIS — D510 Vitamin B12 deficiency anemia due to intrinsic factor deficiency: Secondary | ICD-10-CM

## 2024-07-01 DIAGNOSIS — E559 Vitamin D deficiency, unspecified: Secondary | ICD-10-CM

## 2024-07-01 DIAGNOSIS — D6869 Other thrombophilia: Secondary | ICD-10-CM

## 2024-07-01 DIAGNOSIS — M5030 Other cervical disc degeneration, unspecified cervical region: Secondary | ICD-10-CM

## 2024-07-01 DIAGNOSIS — R32 Unspecified urinary incontinence: Secondary | ICD-10-CM

## 2024-07-01 DIAGNOSIS — Z87440 Personal history of urinary (tract) infections: Secondary | ICD-10-CM

## 2024-07-01 DIAGNOSIS — I130 Hypertensive heart and chronic kidney disease with heart failure and stage 1 through stage 4 chronic kidney disease, or unspecified chronic kidney disease: Secondary | ICD-10-CM | POA: Diagnosis not present

## 2024-07-01 DIAGNOSIS — M1990 Unspecified osteoarthritis, unspecified site: Secondary | ICD-10-CM

## 2024-07-01 DIAGNOSIS — I480 Paroxysmal atrial fibrillation: Secondary | ICD-10-CM | POA: Diagnosis not present

## 2024-07-01 DIAGNOSIS — E782 Mixed hyperlipidemia: Secondary | ICD-10-CM

## 2024-07-01 DIAGNOSIS — Z7984 Long term (current) use of oral hypoglycemic drugs: Secondary | ICD-10-CM

## 2024-07-01 DIAGNOSIS — M4802 Spinal stenosis, cervical region: Secondary | ICD-10-CM | POA: Diagnosis not present

## 2024-07-01 DIAGNOSIS — K219 Gastro-esophageal reflux disease without esophagitis: Secondary | ICD-10-CM

## 2024-07-01 DIAGNOSIS — G959 Disease of spinal cord, unspecified: Secondary | ICD-10-CM

## 2024-07-01 DIAGNOSIS — Z86711 Personal history of pulmonary embolism: Secondary | ICD-10-CM

## 2024-07-01 DIAGNOSIS — M8580 Other specified disorders of bone density and structure, unspecified site: Secondary | ICD-10-CM | POA: Diagnosis not present

## 2024-07-01 DIAGNOSIS — F329 Major depressive disorder, single episode, unspecified: Secondary | ICD-10-CM | POA: Diagnosis not present

## 2024-07-01 DIAGNOSIS — I5033 Acute on chronic diastolic (congestive) heart failure: Secondary | ICD-10-CM | POA: Diagnosis not present

## 2024-07-01 DIAGNOSIS — K222 Esophageal obstruction: Secondary | ICD-10-CM

## 2024-07-01 DIAGNOSIS — F419 Anxiety disorder, unspecified: Secondary | ICD-10-CM

## 2024-07-01 DIAGNOSIS — Z6828 Body mass index (BMI) 28.0-28.9, adult: Secondary | ICD-10-CM

## 2024-07-01 DIAGNOSIS — Z7951 Long term (current) use of inhaled steroids: Secondary | ICD-10-CM

## 2024-07-01 DIAGNOSIS — E039 Hypothyroidism, unspecified: Secondary | ICD-10-CM

## 2024-07-01 DIAGNOSIS — E440 Moderate protein-calorie malnutrition: Secondary | ICD-10-CM | POA: Diagnosis not present

## 2024-07-01 DIAGNOSIS — G459 Transient cerebral ischemic attack, unspecified: Secondary | ICD-10-CM

## 2024-07-01 DIAGNOSIS — D500 Iron deficiency anemia secondary to blood loss (chronic): Secondary | ICD-10-CM | POA: Diagnosis not present

## 2024-07-01 DIAGNOSIS — Z87891 Personal history of nicotine dependence: Secondary | ICD-10-CM

## 2024-07-01 DIAGNOSIS — J8410 Pulmonary fibrosis, unspecified: Secondary | ICD-10-CM

## 2024-07-01 DIAGNOSIS — Z96649 Presence of unspecified artificial hip joint: Secondary | ICD-10-CM

## 2024-07-04 ENCOUNTER — Telehealth: Payer: Self-pay

## 2024-07-04 DIAGNOSIS — J4489 Other specified chronic obstructive pulmonary disease: Secondary | ICD-10-CM | POA: Diagnosis not present

## 2024-07-04 NOTE — Telephone Encounter (Deleted)
 Copied from CRM #[id number]. Topic: General - Other >> Jul 04, 2024  2:55 PM Suzette B wrote: Reason for CRM: Ms Dana from [facility] called from [phone], wanted to let the office know that they've received the orders for the patient but it wouldn't be until tomorrow when the patient is admitted. She states if you have any questions please feel free to reach out to her at the number listed above

## 2024-07-04 NOTE — Telephone Encounter (Signed)
 Copied from CRM #[id number]. Topic: General - Other >> Jul 04, 2024  2:55 PM Suzette B wrote: Reason for CRM: Ms Dana from [facility] called from [phone], wanted to let the office know that they've received the orders for the patient but it wouldn't be until tomorrow when the patient is admitted. She states if you have any questions please feel free to reach out to her at the number listed above

## 2024-07-12 ENCOUNTER — Other Ambulatory Visit: Payer: Self-pay

## 2024-07-12 ENCOUNTER — Emergency Department (HOSPITAL_BASED_OUTPATIENT_CLINIC_OR_DEPARTMENT_OTHER)
Admission: EM | Admit: 2024-07-12 | Discharge: 2024-07-12 | Disposition: A | Discharge status: Home / Self Care | Attending: Emergency Medicine | Admitting: Emergency Medicine

## 2024-07-12 ENCOUNTER — Other Ambulatory Visit (HOSPITAL_COMMUNITY): Payer: Self-pay

## 2024-07-12 ENCOUNTER — Encounter (HOSPITAL_BASED_OUTPATIENT_CLINIC_OR_DEPARTMENT_OTHER): Payer: Self-pay

## 2024-07-12 DIAGNOSIS — Z7984 Long term (current) use of oral hypoglycemic drugs: Secondary | ICD-10-CM | POA: Insufficient documentation

## 2024-07-12 DIAGNOSIS — L03115 Cellulitis of right lower limb: Secondary | ICD-10-CM | POA: Diagnosis not present

## 2024-07-12 DIAGNOSIS — Z7901 Long term (current) use of anticoagulants: Secondary | ICD-10-CM | POA: Diagnosis not present

## 2024-07-12 DIAGNOSIS — M79604 Pain in right leg: Secondary | ICD-10-CM | POA: Diagnosis present

## 2024-07-12 LAB — CBC WITH DIFFERENTIAL/PLATELET
Abs Immature Granulocytes: 0.02 K/uL (ref 0.00–0.07)
Basophils Absolute: 0.1 K/uL (ref 0.0–0.1)
Basophils Relative: 1 %
Eosinophils Absolute: 0.1 K/uL (ref 0.0–0.5)
Eosinophils Relative: 2 %
HCT: 43.7 % (ref 39.0–52.0)
Hemoglobin: 14 g/dL (ref 13.0–17.0)
Immature Granulocytes: 0 %
Lymphocytes Relative: 32 %
Lymphs Abs: 2.1 K/uL (ref 0.7–4.0)
MCH: 30 pg (ref 26.0–34.0)
MCHC: 32 g/dL (ref 30.0–36.0)
MCV: 93.8 fL (ref 80.0–100.0)
Monocytes Absolute: 0.7 K/uL (ref 0.1–1.0)
Monocytes Relative: 10 %
Neutro Abs: 3.8 K/uL (ref 1.7–7.7)
Neutrophils Relative %: 55 %
Platelets: 199 K/uL (ref 150–400)
RBC: 4.66 MIL/uL (ref 4.22–5.81)
RDW: 15.8 % — ABNORMAL HIGH (ref 11.5–15.5)
WBC: 6.8 K/uL (ref 4.0–10.5)
nRBC: 0 % (ref 0.0–0.2)

## 2024-07-12 LAB — BASIC METABOLIC PANEL WITH GFR
Anion gap: 13 (ref 5–15)
BUN: 25 mg/dL — ABNORMAL HIGH (ref 8–23)
CO2: 26 mmol/L (ref 22–32)
Calcium: 9.5 mg/dL (ref 8.9–10.3)
Chloride: 103 mmol/L (ref 98–111)
Creatinine, Ser: 1.45 mg/dL — ABNORMAL HIGH (ref 0.61–1.24)
GFR, Estimated: 46 mL/min — ABNORMAL LOW (ref >60)
Glucose, Bld: 83 mg/dL (ref 70–99)
Potassium: 4 mmol/L (ref 3.5–5.1)
Sodium: 142 mmol/L (ref 135–145)

## 2024-07-12 MED ORDER — VANCOMYCIN HCL IN DEXTROSE 1-5 GM/200ML-% IV SOLN
1000.0000 mg | Freq: Once | INTRAVENOUS | Status: AC
  Administered 2024-07-12: 1000 mg via INTRAVENOUS
  Filled 2024-07-12: qty 200

## 2024-07-12 MED ORDER — VANCOMYCIN HCL 500 MG IV SOLR
500.0000 mg | Freq: Once | INTRAVENOUS | Status: AC
  Administered 2024-07-12: 500 mg via INTRAVENOUS

## 2024-07-12 MED ORDER — DOXYCYCLINE HYCLATE 100 MG PO CAPS
100.0000 mg | ORAL_CAPSULE | Freq: Two times a day (BID) | ORAL | 0 refills | Status: AC
  Filled 2024-07-12: qty 14, 7d supply, fill #0

## 2024-07-12 NOTE — Progress Notes (Signed)
 ED Pharmacy Antibiotic Sign Off An antibiotic consult was received from an ED provider for vancomycin per pharmacy dosing for cellulitis. A chart review was completed to assess appropriateness.   The following one time order(s) were placed:  Vancomycin 1500mg  IV x 1   Further antibiotic and/or antibiotic pharmacy consults should be ordered by the admitting provider if indicated.   Thank you for allowing pharmacy to be a part of this patient's care.   Yahye Siebert, Vernell Helling, [facility]  Clinical Pharmacist 07/12/24 12:13 PM

## 2024-07-12 NOTE — ED Notes (Signed)
Reviewed discharge instructions, medications, and home care with pt. Pt verbalized understanding and had no further questions. Pt exited ED without complications.

## 2024-07-12 NOTE — ED Provider Notes (Signed)
 [place] [facility] AT [facility] Provider Note   CSN: [id number] Arrival date & time: 07/12/24  1130     Patient presents with: Cellulitis   Eric [name] is a 89 y.o. male here with right leg pain and swelling and redness. Onset 4-5 days ago.  Hx of cellulitis treated in the hospital last month with rocephin  and then oral keflex , with improvement of pain and redness.  No fevers at home.  DVT ultrasound in April with no emergent DVT   HPI     Prior to Admission medications   Medication Sig Start Date End Date Taking? Authorizing Provider  doxycycline  (VIBRAMYCIN ) 100 MG capsule Take 1 capsule (100 mg total) by mouth 2 (two) times daily for 7 days. 07/12/24 07/19/24 Yes Gaylen Venning, Donnice PARAS, MD  albuterol  (PROVENTIL ) (2.5 MG/3ML) 0.083% nebulizer solution TAKE 3MLS(1 VIAL) VIA NEBULIZER EVERY 6 HOURS AS NEEDED 10/16/23   Caro Harlene POUR, NP  cetirizine  (ZYRTEC ) 10 MG tablet TAKE 1 TABLET(10 MG) BY MOUTH DAILY 03/22/24   Caro Harlene POUR, NP  Cholecalciferol  (VITAMIN D3) 1000 units CAPS Take 1,000 Units by mouth daily. Take one capsule (1000units) by mouth day.    [provider]  ELIQUIS  5 MG TABS tablet TAKE 1 TABLET BY MOUTH TWICE DAILY 04/24/23   Eubanks, Jessica K, NP  empagliflozin  (JARDIANCE ) 10 MG TABS tablet Take 1 tablet (10 mg total) by mouth daily. 05/09/24   Caro Harlene POUR, NP  Fluticasone -Umeclidin-Vilant (TRELEGY ELLIPTA ) 100-62.5-25 MCG/ACT AEPB Inhale 1 puff into the lungs daily. 01/10/24   Assaker, Darrin, MD  furosemide  (LASIX ) 40 MG tablet Take 1 tablet (40 mg total) by mouth daily. 04/13/24   Arrien, Mauricio Daniel, MD  gabapentin  (NEURONTIN ) 300 MG capsule TAKE 1 CAPSULE(300 MG) BY MOUTH TWICE DAILY 06/21/24   Eubanks, Jessica K, NP  levothyroxine  (SYNTHROID ) 25 MCG tablet TAKE 1 TABLET BY MOUTH EVERY DAY BEFORE BREAKFAST AS DIRECTED 03/22/24   Caro Harlene POUR, NP  OXYGEN  Inhale 4 L/min into the lungs at bedtime.    [provider]  pravastatin  (PRAVACHOL ) 40 MG tablet TAKE 1 TABLET BY MOUTH EVERY DAY Patient taking differently: Take 40 mg by mouth at bedtime. 04/03/24   Caro Harlene POUR, NP  spironolactone  (ALDACTONE ) 25 MG tablet Take 0.5 tablets (12.5 mg total) by mouth daily. 05/09/24   Caro Harlene POUR, NP    Allergies: Morphine and codeine, Percocet [oxycodone-acetaminophen ], and Valium    Review of Systems  Updated Vital Signs BP (!) 119/58   Pulse 64   Temp 97.9 F (36.6 C) (Oral)   Resp 18   Ht 5' 6 (1.676 m)   Wt 78.5 kg   SpO2 95%   BMI 27.92 kg/m   Physical Exam Constitutional:      General: He is not in acute distress. HENT:     Head: Normocephalic and atraumatic.  Eyes:     Conjunctiva/sclera: Conjunctivae normal.     Pupils: Pupils are equal, round, and reactive to light.  Cardiovascular:     Rate and Rhythm: Normal rate and regular rhythm.  Pulmonary:     Effort: Pulmonary effort is normal. No respiratory distress.  Abdominal:     General: There is no distension.     Tenderness: There is no abdominal tenderness.  Musculoskeletal:     Comments: Edema, erythema of right lower extremity from knee to foot  Skin:    General: Skin is warm and dry.  Neurological:     General:  No focal deficit present.     Mental Status: He is alert. Mental status is at baseline.  Psychiatric:        Mood and Affect: Mood normal.        Behavior: Behavior normal.     (all labs ordered are listed, but only abnormal results are displayed) Labs Reviewed  BASIC METABOLIC PANEL WITH GFR - Abnormal; Notable for the following components:      Result Value   BUN 25 (*)    Creatinine, Ser 1.45 (*)    GFR, Estimated 46 (*)    All other components within normal limits  CBC WITH DIFFERENTIAL/PLATELET - Abnormal; Notable for the following components:   RDW 15.8 (*)    All other components within normal limits    EKG: None  Radiology: No results found.   Procedures    Medications Ordered in the ED  vancomycin (VANCOCIN) IVPB 1000 mg/200 mL premix (0 mg Intravenous Stopped 07/12/24 1406)    Followed by  vancomycin (VANCOCIN) 500 mg in sodium chloride  0.9 % 100 mL IVPB (0 mg Intravenous Stopped 07/12/24 1429)    Clinical Course as of 07/12/24 1528  Fri Jul 12, 2024  1336 Pt comfortable on reassessment - vital stable.  He is watching TV and eating popcorn.  Plan to discharge on oral antibiotics with his family [MT]    Clinical Course User Index [MT] Eluzer Howdeshell, Donnice PARAS, MD                                 Medical Decision Making Amount and/or Complexity of Data Reviewed Labs: ordered.  Risk Prescription drug management.   Concern for lower extremity cellulitis Lower suspicion for acute DVT Labs reviewed showing no emergent findings  IV vanco given here and will discharge on doxycycline   I don't see an indication for hospitalization - he improved with oral antibiotics on discharge last month, and can reasonably be trialed on this again.  We'll use doxycycline  for MRSA coverage.  He lives with family and is comfortable going home.  Low suspicion for sepsis.      Final diagnoses:  Cellulitis of right lower extremity    ED Discharge Orders          Ordered    doxycycline  (VIBRAMYCIN ) 100 MG capsule  2 times daily        07/12/24 1336               Cottie Donnice PARAS, MD 07/12/24 1528

## 2024-07-12 NOTE — ED Triage Notes (Signed)
 Pt c/o continued R leg cellulitis x1wk. Recent hospitalization for same. RLE red, swollen, painful to touch per pt.

## 2024-07-15 ENCOUNTER — Ambulatory Visit: Admitting: Nurse Practitioner

## 2024-07-16 ENCOUNTER — Ambulatory Visit (INDEPENDENT_AMBULATORY_CARE_PROVIDER_SITE_OTHER): Admitting: Nurse Practitioner

## 2024-07-16 DIAGNOSIS — Z0000 Encounter for general adult medical examination without abnormal findings: Secondary | ICD-10-CM

## 2024-07-16 NOTE — Progress Notes (Signed)
 Subjective:   Galen Russman Nesheiwat is a 89 y.o. male who presents for Medicare Annual/Subsequent preventive examination.  Visit Complete: Virtual I connected with  Lupita LABOR Collings on 07/16/24 by a video and audio enabled telemedicine application and verified that I am speaking with the correct person using two identifiers.  Patient Location: Home  Provider Location: Office/Clinic  I discussed the limitations of evaluation and management by telemedicine. The patient expressed understanding and agreed to proceed.  Vital Signs: Because this visit was a virtual/telehealth visit, some criteria Sisk be missing or patient reported. Any vitals not documented were not able to be obtained and vitals that have been documented are patient reported.    Cardiac Risk Factors include: advanced age (>65men, >26 women);hypertension;dyslipidemia;male gender;sedentary lifestyle;obesity (BMI >30kg/m2)     Objective:    Today's Vitals   07/16/24 1015  PainSc: 5    There is no height or weight on file to calculate BMI.     07/16/2024    9:47 AM 06/21/2024    2:13 PM 06/20/2024    6:04 PM 06/19/2024   10:11 PM 04/08/2024    5:37 PM 04/03/2024    3:08 PM 01/25/2024   10:49 PM  Advanced Directives  Does Patient Have a Medical Advance Directive? Yes   Yes Yes Yes No  Type of Estate agent of [place];Living will    Healthcare Power of [place];Living will Healthcare Power of [place];Living will   Does patient want to make changes to medical advance directive? No - Patient declined No - Patient declined No - Patient declined  No - Patient declined No - Patient declined   Copy of Healthcare Power of Attorney in Chart? No - copy requested    No - copy requested, Physician notified No - copy requested     Current Medications (verified) Outpatient Encounter Medications as of 07/16/2024  Medication Sig   albuterol  (PROVENTIL ) (2.5 MG/3ML) 0.083% nebulizer solution TAKE 3MLS(1 VIAL) VIA NEBULIZER EVERY 6  HOURS AS NEEDED   cetirizine  (ZYRTEC ) 10 MG tablet TAKE 1 TABLET(10 MG) BY MOUTH DAILY   Cholecalciferol  (VITAMIN D3) 1000 units CAPS Take 1,000 Units by mouth daily. Take one capsule (1000units) by mouth day.   doxycycline  (VIBRAMYCIN ) 100 MG capsule Take 1 capsule (100 mg total) by mouth 2 (two) times daily for 7 days.   ELIQUIS  5 MG TABS tablet TAKE 1 TABLET BY MOUTH TWICE DAILY   empagliflozin  (JARDIANCE ) 10 MG TABS tablet Take 1 tablet (10 mg total) by mouth daily.   Fluticasone -Umeclidin-Vilant (TRELEGY ELLIPTA ) 100-62.5-25 MCG/ACT AEPB Inhale 1 puff into the lungs daily.   furosemide  (LASIX ) 40 MG tablet Take 1 tablet (40 mg total) by mouth daily.   gabapentin  (NEURONTIN ) 300 MG capsule TAKE 1 CAPSULE(300 MG) BY MOUTH TWICE DAILY   ibuprofen  (ADVIL ) 200 MG tablet Take 200 mg by mouth as needed.   levothyroxine  (SYNTHROID ) 25 MCG tablet TAKE 1 TABLET BY MOUTH EVERY DAY BEFORE BREAKFAST AS DIRECTED   OXYGEN  Inhale 4 L/min into the lungs at bedtime.   pravastatin  (PRAVACHOL ) 40 MG tablet TAKE 1 TABLET BY MOUTH EVERY DAY   spironolactone  (ALDACTONE ) 25 MG tablet Take 0.5 tablets (12.5 mg total) by mouth daily.   No facility-administered encounter medications on file as of 07/16/2024.    Allergies (verified) Morphine and codeine, Percocet [oxycodone-acetaminophen ], and Valium   History: Past Medical History:  Diagnosis Date   Anxiety    Arthritis    Atypical chest pain  a. 05/2017 MV: no ischemia, EF 79%.   Chest pain 10/20/2017   Chronic diastolic CHF (congestive heart failure) (HCC)    a. 01/2011 Echo: EF 50-55%, gr1 DD, mild AI, nl RV fxn, mild TR/PR; b. 10/2017 Echo: EF 60-65%, mild LVH, gr2 DD.   DDD (degenerative disc disease), cervical    Depressive disorder, not elsewhere classified    Difficult intubation    Dysphagia, oral phase    Dyspnea    Edema    Gallstones    a. Symptomatic - s/p lap chole 05/2018.   History of DVT (deep vein thrombosis)    History of kidney  stones    Hypertension    Hypoxemia    Impacted cerumen    Ischemic colitis (HCC)    a. 02/2018 GIB - colonoscopy w/ isch colitis. Anticoagulation resumed.   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 a. 01/2011 in setting of post-op complications including aspiration pna;  b. CHA2DS2VASc = 4--> Amio/Eliquis .   Pneumonia 03/06/2003   Spinal stenosis, unspecified region other than cervical    Squamous cell carcinoma of skin of trunk, except scrotum    skin cancer of shoulder   Syncope    a. 10/2017-->Event monitor: RSR, rare PACs/PVCs.   Thoracic or lumbosacral neuritis or radiculitis, unspecified    Past Surgical History:  Procedure Laterality Date   CARDIOVERSION N/A 06/20/2017   Procedure: CARDIOVERSION;  Surgeon: Perla Evalene PARAS, MD;  Location: [facility] ORS;  Service: Cardiovascular;  Laterality: N/A;   CATARACT EXTRACTION W/ INTRAOCULAR LENS  IMPLANT, BILATERAL     CERVICAL FUSION  02/10/2011   CHOLECYSTECTOMY  05/24/2018   CHOLECYSTECTOMY N/A 05/24/2018   Procedure: LAPAROSCOPIC CHOLECYSTECTOMY;  Surgeon: Vernetta Berg, MD;  Location: [facility];  Service: General;  Laterality: N/A;   COLONOSCOPY WITH PROPOFOL  N/A 02/20/2018   Procedure: COLONOSCOPY WITH PROPOFOL ;  Surgeon: Jinny Carmine, MD;  Location: [facility] ENDOSCOPY;  Service: Endoscopy;  Laterality: N/A;   history of abd ultrasound  11/01   fatty liver   MULTIPLE TOOTH EXTRACTIONS     ORIF FIBULA FRACTURE Left 01/06/2017   Procedure: OPEN REDUCTION INTERNAL FIXATION (ORIF) FIBULA FRACTURE DISTAL FIBULA;  Surgeon: Maude Herald, MD;  Location: [facility] OR;  Service: Orthopedics;  Laterality: Left;  Patient states has problems if he will have a tube in throat for  Genera; Anesthesia   Family History  Problem Relation Age of Onset   Stroke Father    Atrial fibrillation Brother        on coumadin   Heart disease Brother        AFib- coumadin   Stroke Paternal Grandfather    Other Mother        hemorrhage   Cancer Other        colon cancer at early age   Prostate cancer Neg Hx    Kidney cancer Neg Hx    Bladder Cancer Neg Hx    Social History   Socioeconomic History   Marital status: Married    Spouse name: Not on file   Number of children: 2   Years of education: 12   Highest education level:  High school graduate  Occupational History   Occupation: Retired 2006    Employer: RETIRED    Comment: Teacher, 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   Left-handed (due to arthritis, he uses his right hand more now).   Caffeine use: 2 cups per day.   Social Drivers of Corporate investment banker Strain: Low Risk  (03/09/2018)   Overall Financial Resource Strain (CARDIA)    Difficulty of Paying Living Expenses: Not hard at all  Food Insecurity: No Food Insecurity (06/20/2024)   Hunger Vital Sign    Worried About Running Out of Food in the Last Year: Never true    Ran Out of Food in the Last Year: Never true  Transportation Needs: No Transportation Needs (06/20/2024)   PRAPARE - Administrator, Civil Service (Medical): No    Lack of Transportation (Non-Medical): No  Physical Activity: Insufficiently Active (03/09/2018)   Exercise Vital Sign    Days of Exercise per Week: 7 days    Minutes of Exercise per Session: 10 min  Stress: No Stress Concern Present (03/09/2018)   [employer] of Occupational Health - Occupational Stress Questionnaire    Feeling of Stress : Not at all  Social Connections: Socially Integrated (06/20/2024)   Social Connection and Isolation Panel    Frequency of Communication with Friends and Family: More than three times a week    Frequency of Social Gatherings with Friends and Family: More than three times a week    Attends Religious Services: More than 4 times per year    Active Member of [employer] or Organizations: Yes    Attends Engineer, structural: More than 4 times per year    Marital Status: Married    Tobacco Counseling Counseling given: Not Answered Tobacco comments: stopped in 20's   Clinical Intake:  Pre-visit preparation completed: Yes  Pain : 0-10 Pain Score: 5  Pain Type: Acute pain Pain Location: Leg Pain Orientation: Right Pain Descriptors / Indicators: Aching Pain Onset: 1 to 4 weeks ago Pain Frequency: Constant     BMI - recorded: 27 Nutritional Status: BMI 25 -29 Overweight Nutritional Risks: Non-healing wound Diabetes: No  How often do you need to have someone help you when you read instructions, pamphlets, or other written materials from your doctor or pharmacy?: 1 - Never         Activities of Daily Living    07/16/2024   10:17 AM 06/20/2024    6:06 PM  In your present state of health, do you have any difficulty performing the following activities:  Hearing? 0   Vision? 0   Difficulty concentrating or making decisions? 0   Walking or climbing stairs? 0   Dressing or bathing? 0   Doing errands, shopping? 1 1  Comment does not drive   Preparing Food and eating ? N   Using the Toilet? N   In the past six months, have you accidently leaked urine? N   Do you have problems with loss of bowel control?  N   Managing your Medications? N   Managing your Finances? N   Housekeeping or managing your Housekeeping? N     Patient Care Team: Caro Harlene POUR, NP as PCP - General (Geriatric  Medicine) Perla Evalene PARAS, MD as PCP - Cardiology (Cardiology) Perla Evalene PARAS, MD as Consulting Physician (Cardiology) Cesario Boer, MD as Attending Physician (Physical Medicine and Rehabilitation) Beuford Anes, MD as Consulting Physician (Orthopedic Surgery) Linard Alm NOVAK, MD (Inactive) as Consulting Physician (Pulmonary Disease)  Indicate any recent Medical Services you Deavers have received from other than [facility] providers in the past year (date [place] be approximate).     Assessment:   This is a routine wellness examination for Vashawn.  Hearing/Vision screen Hearing Screening - Comments:: No hearing issues  Vision Screening - Comments:: Last eye exam greater than 12 months ago and no pending appointment. Dr.Bell,Alan    Goals Addressed   None    Depression Screen    07/16/2024    9:46 AM 03/31/2023    9:38 AM 03/24/2022    9:54 AM 03/11/2022   10:29 AM 03/18/2021    9:52 AM 12/31/2020   10:49 AM 12/28/2020   10:28 AM  PHQ 2/9 Scores  PHQ - 2 Score 0 0 0 0 0 0 0    Fall Risk    07/16/2024    9:45 AM 06/07/2024   12:53 PM 04/03/2024    3:08 PM 06/23/2023    9:19 AM 03/31/2023    9:38 AM  Fall Risk   Falls in the past year? 1 1 0 1 0  Number falls in past yr: 0 0 0 1 0  Injury with Fall? 0 0 0 1 0  Risk for fall due to : No Fall Risks Impaired balance/gait No Fall Risks History of fall(s);Impaired balance/gait;Impaired mobility No Fall Risks  Follow up Falls evaluation completed Falls evaluation completed Falls evaluation completed Falls evaluation completed Falls evaluation completed    MEDICARE RISK AT HOME:    TIMED UP AND GO:  Was the test performed?  No    Cognitive Function:    03/09/2018   10:26 AM 05/16/2016    9:00 AM  MMSE - Mini Mental State Exam  Orientation to time 5 5   Orientation to Place 5 5   Registration 3 3   Attention/ Calculation 5 5   Recall 2 3   Language- name 2 objects 2 2   Language- repeat 1 1  Language- follow 3 step command 3 3    Language- read & follow direction 1 1   Write a sentence 1 1   Copy design 0 1   Total score 28 30      Data saved with a previous flowsheet row definition        07/16/2024    9:51 AM 03/31/2023    9:39 AM 03/24/2022    9:56 AM 03/18/2021    9:55 AM 03/16/2020   10:00 AM  6CIT Screen  What Year? 0 points 0 points 0 points 0 points 0 points  What month? 0 points 0 points 0 points 0 points 0 points  What time? 0 points 0 points 0 points 0 points 0 points  Count back from 20 0 points 0 points 0 points 0 points 0 points  Months in reverse 0 points 0 points 0 points 0 points 0 points  Repeat phrase 8 points 8 points 2 points 8 points 6 points  Total Score 8 points 8  points 2 points 8 points 6 points    Immunizations Immunization History  Administered Date(s) Administered   Fluad Quad(high Dose 65+) 10/11/2019, 11/02/2020, 08/13/2021, 09/23/2022   Fluad Trivalent(High Dose 65+) 09/20/2023   Influenza Whole 09/11/1997, 10/11/2007, 10/06/2010   Influenza, High Dose Seasonal PF 09/01/2017, 08/30/2018   Influenza,inj,Quad PF,6+ Mos 10/03/2013, 09/15/2014, 09/04/2015   PFIZER(Purple Top)SARS-COV-2 Vaccination 01/18/2020, 02/08/2020   Pneumococcal Conjugate-13 01/19/2015   Pneumococcal Polysaccharide-23 03/05/2002   Td 01/19/2003   Tdap 10/14/2013   Zoster, Live 10/14/2013    TDAP status: Due, Education has been provided regarding the importance of this vaccine. Advised Tiberio receive this vaccine at local pharmacy or Health Dept. Aware to provide a copy of the vaccination record if obtained from local pharmacy or Health Dept. Verbalized acceptance and understanding.  Flu Vaccine status: Due, Education has been provided regarding the importance of this vaccine. Advised [place]man receive this vaccine at local pharmacy or Health Dept. Aware to provide a copy of the vaccination record if obtained from local pharmacy or Health Dept. Verbalized acceptance and understanding.  Pneumococcal vaccine  status: Up to date  Covid-19 vaccine status: Information provided on how to obtain vaccines.   Qualifies for Shingles Vaccine? Yes   Zostavax completed No   Shingrix Completed?: No.    Education has been provided regarding the importance of this vaccine. Patient has been advised to call insurance company to determine out of pocket expense if they have not yet received this vaccine. Advised Lightner also receive vaccine at local pharmacy or Health Dept. Verbalized acceptance and understanding.  Screening Tests Health Maintenance  Topic Date Due   Zoster Vaccines- Shingrix (1 of 2) 05/16/1951   Hepatitis B Vaccines (1 of 3 - Risk 3-dose series) Never done   COVID-19 Vaccine (3 - Pfizer risk series) 03/07/2020   DTaP/Tdap/Td (3 - Td or Tdap) 10/15/2023   INFLUENZA VACCINE  09/11/2024 (Originally 07/12/2024)   Medicare Annual Wellness (AWV)  07/16/2025   Pneumococcal Vaccine: 50+ Years  Completed   HPV VACCINES  Aged Out   Meningococcal B Vaccine  Aged Out   Colonoscopy  Discontinued    Health Maintenance  Health Maintenance Due  Topic Date Due   Zoster Vaccines- Shingrix (1 of 2) 05/16/1951   Hepatitis B Vaccines (1 of 3 - Risk 3-dose series) Never done   COVID-19 Vaccine (3 - Pfizer risk series) 03/07/2020   DTaP/Tdap/Td (3 - Td or Tdap) 10/15/2023    Colorectal cancer screening: No longer required.   Lung Cancer Screening: (Low Dose CT Chest recommended if Age 15-80 years, 20 pack-year currently smoking OR have quit w/in 15years.) does not qualify.   Lung Cancer Screening Referral: na  Additional Screening:  Hepatitis C Screening: does not qualify; Completed   Vision Screening: Recommended annual ophthalmology exams for early detection of glaucoma and other disorders of the eye. Is the patient up to date with their annual eye exam?  No  Who is the provider or what is the name of the office in which the patient attends annual eye exams? Dr Carolee If pt is not established with a  provider, would they like to be referred to a provider to establish care? No .   Dental Screening: Recommended annual dental exams for proper oral hygiene   Community Resource Referral / Chronic Care Management: CRR required this visit?  No   CCM required this visit?  No     Plan:     I have personally reviewed and noted  the following in the patient's chart:   Medical and social history Use of alcohol, tobacco or illicit drugs  Current medications and supplements including opioid prescriptions. Patient is not currently taking opioid prescriptions. Functional ability and status Nutritional status Physical activity Advanced directives List of other physicians Hospitalizations, surgeries, and ER visits in previous 12 months Vitals Screenings to include cognitive, depression, and falls Referrals and appointments  In addition, I have reviewed and discussed with patient certain preventive protocols, quality metrics, and best practice recommendations. A written personalized care plan for preventive services as well as general preventive health recommendations were provided to patient.     Harlene MARLA An, NP   07/16/2024   After Visit Summary: (MyChart) Due to this being a telephonic visit, the after visit summary with patients personalized plan was offered to patient via MyChart

## 2024-07-16 NOTE — Patient Instructions (Addendum)
 Visit your local pharmacy to receive your shingles, covid, tetanus vaccines. If you believe these are up to date please submit documentation through mychart or bring to your next appointment.   Please make appt for eye exam- should have yearly.    Mr. Khachatryan , Thank you for taking time to come for your Medicare Wellness Visit. I appreciate your ongoing commitment to your health goals. Please review the following plan we discussed and let me know if I can assist you in the future.    This is a list of the screening recommended for you and due dates:  Health Maintenance  Topic Date Due   Zoster (Shingles) Vaccine (1 of 2) 05/16/1951   Hepatitis B Vaccine (1 of 3 - Risk 3-dose series) Never done   COVID-19 Vaccine (3 - Pfizer risk series) 03/07/2020   DTaP/Tdap/Td vaccine (3 - Td or Tdap) 10/15/2023   Flu Shot  09/11/2024*   Medicare Annual Wellness Visit  07/16/2025   Pneumococcal Vaccine for age over 59  Completed   HPV Vaccine  Aged Out   Meningitis B Vaccine  Aged Out   Colon Cancer Screening  Discontinued  *Topic was postponed. The date shown is not the original due date.

## 2024-07-16 NOTE — Progress Notes (Signed)
   This service is provided via telemedicine  No vital signs collected/recorded due to the encounter was a telemedicine visit.   Location of patient (ex: home, work):  Home  Patient consents to a telephone visit: Yes  Location of the provider (ex: office, home):  [facility]    Name of any referring provider:  N/A  Names of all persons participating in the telemedicine service and their role in the encounter:  S.Chrae B/CMA, Harlene An, NP,  Lucie (granddaughter) and Patient   Time spent on call:  9 min with medical assistant

## 2024-07-22 ENCOUNTER — Ambulatory Visit: Admitting: Podiatry

## 2024-07-22 ENCOUNTER — Encounter: Payer: Self-pay | Admitting: Podiatry

## 2024-07-22 DIAGNOSIS — M79675 Pain in left toe(s): Secondary | ICD-10-CM

## 2024-07-22 DIAGNOSIS — B351 Tinea unguium: Secondary | ICD-10-CM

## 2024-07-22 DIAGNOSIS — D689 Coagulation defect, unspecified: Secondary | ICD-10-CM

## 2024-07-22 DIAGNOSIS — M79674 Pain in right toe(s): Secondary | ICD-10-CM

## 2024-07-22 NOTE — Progress Notes (Signed)
 This patient returns to my office for at risk foot care.  This patient requires this care by a professional since this patient will be at risk due to having coagulation defect.  This patient is unable to cut nails himself since the patient cannot reach his nails.These nails are painful walking and wearing shoes.  This patient has not been seen for over 11 months. This patient presents for at risk foot care today.  General Appearance  Alert, conversant and in no acute stress.  Vascular  Dorsalis pedis and posterior tibial  pulses are  weakly palpable  bilaterally.  Capillary return is within normal limits  bilaterally. Temperature is within normal limits  bilaterally.  Neurologic  Senn-Weinstein monofilament wire test within normal limits  bilaterally. Muscle power within normal limits bilaterally.  Nails Thick disfigured discolored nails with subungual debris  from hallux to fifth toes bilaterally. No evidence of bacterial infection or drainage bilaterally.  Orthopedic  No limitations of motion  feet .  No crepitus or effusions noted.  Hammer toes  B/L.SABRA  Skin  normotropic skin with no porokeratosis noted bilaterally.  No signs of infections or ulcers noted.     Onychomycosis  Pain in right toes  Pain in left toes  Consent was obtained for treatment procedures.   Mechanical debridement of nails 1-5  bilaterally performed with a nail nipper.  Filed with dremel without incident.    Return office visit   3 months                   Told patient to return for periodic foot care and evaluation due to potential at risk complications.   Cordella Bold DPM

## 2024-07-25 ENCOUNTER — Ambulatory Visit: Attending: Medical | Admitting: Medical

## 2024-07-25 ENCOUNTER — Encounter: Payer: Self-pay | Admitting: Medical

## 2024-07-25 VITALS — BP 115/60 | HR 56 | Ht 66.0 in | Wt 174.4 lb

## 2024-07-25 DIAGNOSIS — I5032 Chronic diastolic (congestive) heart failure: Secondary | ICD-10-CM | POA: Diagnosis not present

## 2024-07-25 DIAGNOSIS — N1832 Chronic kidney disease, stage 3b: Secondary | ICD-10-CM

## 2024-07-25 DIAGNOSIS — I4821 Permanent atrial fibrillation: Secondary | ICD-10-CM | POA: Diagnosis not present

## 2024-07-25 DIAGNOSIS — J441 Chronic obstructive pulmonary disease with (acute) exacerbation: Secondary | ICD-10-CM

## 2024-07-25 DIAGNOSIS — J9611 Chronic respiratory failure with hypoxia: Secondary | ICD-10-CM | POA: Diagnosis not present

## 2024-07-25 NOTE — Progress Notes (Signed)
 Cardiology Office Note   Date:  07/25/2024  ID:  Eric [name], DOB [birth year], MRN [id number] PCP: Caro Harlene POUR, NP   HeartCare Providers Cardiologist:  Evalene Lunger, MD    History of Present Illness Eric [name] is a 89 y.o. male with a h/o permanent Afib, PVCs, remote smoking, dilated ascending aorta, chornic HFpEF, chronic respiratory distress/COPD, HLD, HTN, hypothyroidism who presents for follow-up of CHF.    The patient has a h/o Afib with cardioversion 06/2017. He had recurrent Afib 08/2021 off amiodarone  due to thyroid  disease. Medical management was recommended. He is on Eliquis .    Echo in 2018 showed EF of 55 to 60%.  Myoview  Lexiscan  showed no significant ischemia, overall low risk.  Echo in November 2018 showed EF of 60 to 65%, grade 2 diastolic dysfunction, normal wall motion.  Heart monitor for syncope in 12/2017 showed normal sinus rhythm, no significant arrhythmia, rare PACs and PVCs.  Heart monitor in October 2019 showed normal sinus rhythm, second-degree AV block-Mobitz type I, rare ectopy.  Patient triggered events not associated with arrhythmia.  Echo in 2024 showed normal LVEF, severely dilated left atrium, severely dilated right atrium, mild MR.   Patient was last seen 04/08/24 reporting lower leg edema, low HR and lower leg edema. EKG showed Afib HR 52bpm with PVCs. IT was recommended he go to the ER.  The patient went to the ER and was admitted for shortness of breath suspected from COPD and acute on chronic diastolic heart failure.  BNP 256.  Chest CT with small bilateral pleural effusions.  Echo showed EF 55 to 60%.  He was treated with IV Lasix . He had brief AKI. Patient was discharged on Lasix  40 mg daily.   The patient was last seen 04/24/2024 reporting weight loss of 194 and is 173 with diuresis.  He has was overall feeling better.  Today, the patient is overall doing Ok.  Since the last visit he was treated for cellulitis right leg in the hospital. Weight is  stable. He has SOB from COPD, this is worse when the weather changes.He wears 4 L O2 at night. He has chest pain when he cough.   Studies Reviewed      Echo 04/09/2024 1. Left ventricular ejection fraction, by estimation, is 55 to 60%. The  left ventricle has normal function. The left ventricle has no regional  wall motion abnormalities. There is mild concentric left ventricular  hypertrophy. Left ventricular diastolic  function could not be evaluated.   2. Right ventricular systolic function is normal. The right ventricular  size is normal. There is mildly elevated pulmonary artery systolic  pressure.   3. Left atrial size was severely dilated.   4. Right atrial size was severely dilated.   5. The mitral valve is grossly normal. Mild to moderate mitral valve  regurgitation. No evidence of mitral stenosis.   6. The aortic valve is tricuspid. There is mild calcification of the  aortic valve. Aortic valve regurgitation is mild. Aortic valve  sclerosis/calcification is present, without any evidence of aortic  stenosis.   7. The inferior vena cava is dilated in size with <50% respiratory  variability, suggesting right atrial pressure of 15 mmHg.   Comparison(s): Prior images reviewed side by side. The right ventricular  systolic function has improved.    Echo 09/2023  1. Left ventricular ejection fraction, by estimation, is 60 to 65%. The  left ventricle has normal function. The left ventricle has no regional  wall motion abnormalities. Left ventricular diastolic parameters are  indeterminate.   2. Right ventricular systolic function is moderately reduced. The right  ventricular size is moderately enlarged. There is mildly elevated  pulmonary artery systolic pressure.   3. Left atrial size was severely dilated.   4. Right atrial size was severely dilated.   5. The mitral valve is normal in structure. Mild mitral valve  regurgitation. No evidence of mitral stenosis.   6. The aortic  valve is normal in structure. Aortic valve regurgitation is  trivial. No aortic stenosis is present.   7. The inferior vena cava is dilated in size with <50% respiratory  variability, suggesting right atrial pressure of 15 mmHg.    Echo 2018  1. Left ventricular ejection fraction, by estimation, is 60 to 65%. The  left ventricle has normal function. The left ventricle has no regional  wall motion abnormalities. Left ventricular diastolic parameters are  indeterminate.   2. Right ventricular systolic function is moderately reduced. The right  ventricular size is moderately enlarged. There is mildly elevated  pulmonary artery systolic pressure.   3. Left atrial size was severely dilated.   4. Right atrial size was severely dilated.   5. The mitral valve is normal in structure. Mild mitral valve  regurgitation. No evidence of mitral stenosis.   6. The aortic valve is normal in structure. Aortic valve regurgitation is  trivial. No aortic stenosis is present.   7. The inferior vena cava is dilated in size with <50% respiratory  variability, suggesting right atrial pressure of 15 mmHg.    Myoview  lexiscan  2018 Narrative & Impression  Pharmacological myocardial perfusion imaging study with no significant  ischemia Significant GI uptake artifact  Normal wall motion, EF estimated at 79% No EKG changes concerning for ischemia at peak stress or in recovery. Rhythm is atrial fibrillation, rare PVCs Low risk scan       Physical Exam VS:  BP 115/60 (BP Location: Right Arm, Patient Position: Sitting, Cuff Size: Normal)   Pulse (!) 56   Ht 5' 6 (1.676 m)   Wt 174 lb 6.4 oz (79.1 kg)   SpO2 93%   BMI 28.15 kg/m        Wt Readings from Last 3 Encounters:  07/25/24 174 lb 6.4 oz (79.1 kg)  07/12/24 173 lb (78.5 kg)  06/19/24 173 lb (78.5 kg)    GEN: Well nourished, well developed in no acute distress NECK: No JVD; No carotid bruits CARDIAC: RRR, no murmurs, rubs, gallops RESPIRATORY:   Clear to auscultation without rales, wheezing or rhonchi  ABDOMEN: Soft, non-tender, non-distended EXTREMITIES:  trace edema; No deformity   ASSESSMENT AND PLAN  Chronic HFpEF Patient reports stable weights on current dose of Lasix .  Echo showed EF of 55 to 60%, mild LVH, severely dilated atrium, mild to moderate MR, mild calcification of the aortic valve.  Baseline weight ~173 pounds.  He reports low-salt diet and leg elevation.  Continue Lasix  40 mg daily, Jardiance  10 mg daily and spironolactone  12.5 mg daily.  Permanent A-fib Beta-blocker discontinued due to bradycardia.  Amiodarone  stopped for thyroid  disease. Most recent EKG showed Afib with rates of 84bpm. Continue Eliquis  5 mg twice daily for stroke prophylaxis.  CKD stage III Serum creatinine 1.4 on most recent check.  COPD Chronic respiratory failure Patient continues to use 4 L of oxygen  at night.     Dispo: Follow-up in 6 months  Signed, Travian Kerner VEAR Fishman, PA-C

## 2024-07-25 NOTE — Patient Instructions (Signed)
 Medication Instructions:  The current medical regimen is effective;  continue present plan and medications as directed. Please refer to the Current Medication list given to you today.   *If you need a refill on your cardiac medications before your next appointment, please call your pharmacy*  Follow-Up: At [facility], you and your health needs are our priority.  As part of our continuing mission to provide you with exceptional heart care, our providers are all part of one team.  This team includes your primary Cardiologist (physician) and Advanced Practice Providers or APPs (Physician Assistants and Nurse Practitioners) who all work together to provide you with the care you need, when you need it.  Your next appointment:   6 month(s)  Provider:   You Boike see Timothy Gollan, MD or one of the following Advanced Practice Providers on your designated Care Team:   Lonni Meager, NP Lesley Maffucci, PA-C Bernardino Bring, PA-C Cadence Helena, PA-C Tylene Lunch, NP Barnie Hila, NP    We recommend signing up for the patient portal called MyChart.  Sign up information is provided on this After Visit Summary.  MyChart is used to connect with patients for Virtual Visits (Telemedicine).  Patients are able to view lab/test results, encounter notes, upcoming appointments, etc.  Non-urgent messages can be sent to your provider as well.   To learn more about what you can do with MyChart, go to [email or web address].

## 2024-08-04 DIAGNOSIS — J4489 Other specified chronic obstructive pulmonary disease: Secondary | ICD-10-CM | POA: Diagnosis not present

## 2024-08-06 ENCOUNTER — Other Ambulatory Visit: Payer: Self-pay | Admitting: Nurse Practitioner

## 2024-08-16 ENCOUNTER — Encounter: Payer: Self-pay | Admitting: Orthopedic Surgery

## 2024-08-16 ENCOUNTER — Telehealth (INDEPENDENT_AMBULATORY_CARE_PROVIDER_SITE_OTHER): Admitting: Orthopedic Surgery

## 2024-08-16 DIAGNOSIS — L03119 Cellulitis of unspecified part of limb: Secondary | ICD-10-CM

## 2024-08-16 MED ORDER — DOXYCYCLINE HYCLATE 100 MG PO TABS: 100.0000 mg | ORAL_TABLET | Freq: Two times a day (BID) | ORAL | 0 refills | Status: DC

## 2024-08-16 MED ORDER — MUPIROCIN 2 % EX OINT: 1.0000 | TOPICAL_OINTMENT | Freq: Every day | CUTANEOUS | 0 refills | Status: AC

## 2024-08-16 NOTE — Progress Notes (Signed)
   This service is provided via telemedicine  No vital signs collected/recorded due to the encounter was a telemedicine visit.   Location of patient (ex: home, work):  Home  Patient consents to a telephone visit:  Yes  Location of the provider (ex: office, home):  [employer]  Name of any referring provider:  Caro Harlene POUR, NP   Names of all persons participating in the telemedicine service and their role in the encounter:  Patient, Eric [name]/Granddaughter, Rolin All, RMA, Greig Cluster, NP.    Time spent on call:  8 minutes spent on the phone with Medical Assistant.

## 2024-08-16 NOTE — Progress Notes (Signed)
 Careteam: Patient Care Team: Caro Harlene POUR, NP as PCP - General (Geriatric Medicine) Perla Evalene PARAS, MD as PCP - Cardiology (Cardiology) Perla Evalene PARAS, MD as Consulting Physician (Cardiology) Cesario Boer, MD as Attending Physician (Physical Medicine and Rehabilitation) Beuford Anes, MD as Consulting Physician (Orthopedic Surgery) Linard Alm NOVAK, MD (Inactive) as Consulting Physician (Pulmonary Disease)  Seen by: Greig Cluster, AGNP-C  PLACE OF SERVICE:  [facility]  Advanced Directive information Does Patient Have a Medical Advance Directive?: Yes, Type of Advance Directive: Healthcare Power of [place];Living will, Does patient want to make changes to medical advance directive?: No - Patient declined  Allergies  Allergen Reactions   Morphine And Codeine Shortness Of Breath   Percocet [Oxycodone-Acetaminophen ] Shortness Of Breath   Valium Shortness Of Breath    Chief Complaint  Patient presents with   Leg Swelling    Patient complains right leg swollen, and yellow leg drainage. This started 2-3 days ago.      HPI: Patient is a 89 y.o. male seen today via video visit due to right leg celluilitis.   Discussed the use of AI scribe software for clinical note transcription with the patient, who gave verbal consent to proceed.  History of Present Illness   Eric [name] is a 89 year old male who presents with a wound on his leg and recurrent cellulitis.  The wound on his leg began two days ago, accompanied by redness and pain in the area. Creamy exudate present on wound. He has not used any over-the-counter treatments for the infection. No fevers.   Earlier last month, he visited the emergency room for an infection on the same leg, which was treated with doxycycline  and given IV vanc once, resulting in improvement at that time. He denies any initial trauma, such as bumping into something, or any insect bites, including ticks or spiders, that could have caused the wound.  Overall skin described as thin and fragile.       Review of Systems:  Review of Systems  Constitutional:  Negative for fever.  Respiratory: Negative.    Cardiovascular: Negative.   Skin:        wound  Psychiatric/Behavioral:  Negative for depression. The patient is not nervous/anxious.     Past Medical History:  Diagnosis Date   Anxiety    Arthritis    Atypical chest pain    a. 05/2017 MV: no ischemia, EF 79%.   Chest pain 10/20/2017   Chronic diastolic CHF (congestive heart failure) (HCC)    a. 01/2011 Echo: EF 50-55%, gr1 DD, mild AI, nl RV fxn, mild TR/PR; b. 10/2017 Echo: EF 60-65%, mild LVH, gr2 DD.   DDD (degenerative disc disease), cervical    Depressive disorder, not elsewhere classified    Difficult intubation    Dysphagia, oral phase    Dyspnea    Edema    Gallstones    a. Symptomatic - s/p lap chole 05/2018.   History of DVT (deep vein thrombosis)    History of kidney stones    Hypertension    Hypoxemia    Impacted cerumen    Ischemic colitis (HCC)    a. 02/2018 GIB - colonoscopy w/ isch colitis. Anticoagulation resumed.   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 a. 01/2011 in setting of post-op complications including aspiration pna;  b. CHA2DS2VASc = 4--> Amio/Eliquis .   Pneumonia 03/06/2003   Spinal stenosis, unspecified region other than cervical    Squamous cell carcinoma of skin of trunk, except scrotum    skin cancer of shoulder   Syncope    a. 10/2017-->Event monitor: RSR, rare PACs/PVCs.   Thoracic or lumbosacral neuritis or radiculitis, unspecified    Past Surgical History:  Procedure Laterality Date   CARDIOVERSION N/A 06/20/2017   Procedure: CARDIOVERSION;  Surgeon:  Perla Evalene PARAS, MD;  Location: [facility] ORS;  Service: Cardiovascular;  Laterality: N/A;   CATARACT EXTRACTION W/ INTRAOCULAR LENS  IMPLANT, BILATERAL     CERVICAL FUSION  02/10/2011   CHOLECYSTECTOMY  05/24/2018   CHOLECYSTECTOMY N/A 05/24/2018   Procedure: LAPAROSCOPIC CHOLECYSTECTOMY;  Surgeon: Vernetta Berg, MD;  Location: [facility] OR;  Service: General;  Laterality: N/A;   COLONOSCOPY WITH PROPOFOL  N/A 02/20/2018   Procedure: COLONOSCOPY WITH PROPOFOL ;  Surgeon: Jinny Carmine, MD;  Location: [facility] ENDOSCOPY;  Service: Endoscopy;  Laterality: N/A;   history of abd ultrasound  11/01   fatty liver   MULTIPLE TOOTH EXTRACTIONS     ORIF FIBULA FRACTURE Left 01/06/2017   Procedure: OPEN REDUCTION INTERNAL FIXATION (ORIF) FIBULA FRACTURE DISTAL FIBULA;  Surgeon: Maude Herald, MD;  Location: [facility] OR;  Service: Orthopedics;  Laterality: Left;  Patient states has problems if he will have a tube in throat for Genera; Anesthesia   Social History:   reports that he has quit smoking. His smoking use included cigarettes. He has never used smokeless tobacco. He reports that he does not drink alcohol and does not use drugs.  Family History  Problem Relation Age of Onset   Stroke Father    Atrial fibrillation Brother        on coumadin   Heart disease Brother        AFib- coumadin   Stroke Paternal Grandfather    Other Mother        hemorrhage   Cancer Other        colon cancer at early age   Prostate cancer Neg Hx    Kidney cancer Neg Hx    Bladder Cancer Neg Hx     Medications: Patient's Medications  New Prescriptions   No medications on file  Previous Medications   ALBUTEROL  (PROVENTIL ) (2.5 MG/3ML) 0.083% NEBULIZER SOLUTION    TAKE 3MLS(1 VIAL) VIA NEBULIZER EVERY 6 HOURS AS NEEDED   CETIRIZINE  (ZYRTEC ) 10 MG TABLET    TAKE 1 TABLET(10 MG) BY MOUTH DAILY   CHOLECALCIFEROL  (VITAMIN D3) 1000 UNITS CAPS    Take 1,000 Units by mouth daily. Take one capsule (1000units) by mouth day.   ELIQUIS  5 MG  TABS TABLET    TAKE 1 TABLET BY MOUTH TWICE DAILY   EMPAGLIFLOZIN  (JARDIANCE ) 10 MG TABS TABLET    Take 1 tablet (10 mg total) by mouth daily.   FLUTICASONE -UMECLIDIN-VILANT (TRELEGY ELLIPTA ) 100-62.5-25 MCG/ACT AEPB    Inhale 1 puff into the lungs daily.   FUROSEMIDE  (LASIX ) 40 MG TABLET    Take 1 tablet (40 mg total) by mouth daily.   GABAPENTIN  (NEURONTIN ) 300 MG CAPSULE    TAKE 1 CAPSULE(300 MG) BY MOUTH TWICE DAILY   IBUPROFEN  (ADVIL ) 200 MG TABLET    Take 200 mg by mouth as needed.   LEVOTHYROXINE  (  SYNTHROID ) 25 MCG TABLET    TAKE 1 TABLET BY MOUTH EVERY DAY BEFORE BREAKFAST AS DIRECTED   OXYGEN     Inhale 4 L/min into the lungs at bedtime.   PRAVASTATIN  (PRAVACHOL ) 40 MG TABLET    TAKE 1 TABLET BY MOUTH EVERY DAY   SPIRONOLACTONE  (ALDACTONE ) 25 MG TABLET    Take 0.5 tablets (12.5 mg total) by mouth daily.  Modified Medications   No medications on file  Discontinued Medications   No medications on file    Physical Exam:  There were no vitals filed for this visit. There is no height or weight on file to calculate BMI. Wt Readings from Last 3 Encounters:  07/25/24 174 lb 6.4 oz (79.1 kg)  07/12/24 173 lb (78.5 kg)  06/19/24 173 lb (78.5 kg)    Physical Exam Vitals (exam limited due to virtual visit) reviewed.  Constitutional:      General: He is not in acute distress. HENT:     Head: Normocephalic.  Eyes:     General:        Right eye: No discharge.        Left eye: No discharge.  Skin:    Comments: Open wound to right shin, surrounded with redness, appears inflamed.   Neurological:     Mental Status: He is alert.     Labs reviewed: Basic Metabolic Panel: Recent Labs    09/15/23 0626 09/16/23 0537 09/17/23 0650 09/18/23 1004 09/19/23 0927 09/20/23 0540 10/06/23 1030 04/09/24 0508 04/10/24 0227 06/20/24 0700 06/21/24 0613 07/12/24 1247  NA 137   < > 140 136   < > 138   < > 140   < > 136 139 142  K 4.1   < > 3.9 4.0   < > 3.8   < > 3.8   < > 4.4 4.2 4.0   CL 102   < > 98 95*   < > 96*   < > 102   < > 102 103 103  CO2 24   < > 31 28   < > 32   < > 30   < > 23 27 26   GLUCOSE 164*   < > 117* 156*   < > 120*   < > 98   < > 107* 93 83  BUN 28*   < > 33* 34*   < > 37*   < > 24*   < > 27* 26* 25*  CREATININE 1.25*   < > 1.36* 1.36*   < > 1.03   < > 1.24   < > 1.37* 1.49* 1.45*  CALCIUM 8.5*   < > 8.5* 8.7*   < > 8.9   < > 8.9   < > 9.0 8.8* 9.5  MG 2.0   < > 2.2 2.3  --  2.4  --   --   --   --   --   --   PHOS 2.4*  --   --   --   --   --   --   --   --   --   --   --   TSH  --   --   --   --   --   --   --  2.539  --   --   --   --    < > = values in this interval not displayed.   Liver Function Tests: Recent Labs    09/14/23  1707 04/09/24 0508 06/19/24 2205  AST 40 41 42*  ALT 40 34 40  ALKPHOS 47 65 61  BILITOT 1.7* 2.3* 1.7*  PROT 6.9 6.9 8.0  ALBUMIN 3.7 3.6 4.2   No results for input(s): LIPASE, AMYLASE in the last 8760 hours. No results for input(s): AMMONIA in the last 8760 hours. CBC: Recent Labs    04/09/24 0508 06/19/24 2205 06/20/24 0700 06/21/24 0613 07/12/24 1247  WBC 5.8 13.1* 15.3* 9.1 6.8  NEUTROABS 3.5 10.1*  --   --  3.8  HGB 13.8 16.0 14.6 13.5 14.0  HCT 44.4 49.7 44.6 42.7 43.7  MCV 95.7 93.8 93.3 93.8 93.8  PLT 152 222 201 173 199   Lipid Panel: No results for input(s): CHOL, HDL, LDLCALC, TRIG, CHOLHDL, LDLDIRECT in the last 8760 hours. TSH: Recent Labs    04/09/24 0508  TSH 2.539   A1C: Lab Results  Component Value Date   HGBA1C 5.7 (H) 05/16/2016     Assessment/Plan 1. Recurrent cellulitis of lower extremity (Primary) - 08/19 ED visit due to RLE cellulitis> improved with doxycycline  and IV Vanc - ? Poor circulation as cause for reoccurrence - recommend oral ABX, mupirocin  and vascular consult - discussed shin bumpers/guards - advised to increase protein intake to promote wound healing - consider wound consult if no improvement - doxycycline  (VIBRA -TABS) 100 MG  tablet; Take 1 tablet (100 mg total) by mouth 2 (two) times daily for 7 days.  Dispense: 14 tablet; Refill: 0 - mupirocin  ointment (BACTROBAN ) 2 %; Apply 1 Application topically daily for 7 days.  Dispense: 15 g; Refill: 0 - Ambulatory referral to Vascular Surgery  Total time: 15 minutes. Greater than 50% of total time spent doing patient education regarding recurrent cellulitis including symptom/medication management.    Video Visit   I connected with Eric [name] by video visit and verified that I am speaking with the correct person using two identifiers.  Location: [facility] Patient: Eric [name] Patient location: home Provider:Katelynne Revak FORBES Cluster, NP    I discussed the limitations, risks, security and privacy concerns of performing an evaluation and management service by telephone and the availability of in person appointments. I also discussed with the patient that there [place] be a patient responsible charge related to this service. The patient expressed understanding and agreed to proceed.  I discussed the assessment and treatment plan with the patient. The patient was provided an opportunity to ask questions and all were answered. The patient agreed with the plan and demonstrated an understanding of the instructions.   The patient was advised to call back or seek an in-person evaluation if the symptoms worsen or if the condition fails to improve as anticipated.  I provided 12 minutes of face-to-face time during this encounter.  Niharika Savino Cluster, AGNP Avs printed and mailed   Next appt: Eric [name] FORBES Cluster, NP  Renee Erb Cluster BODILY  [facility] [phone]

## 2024-08-21 ENCOUNTER — Telehealth: Payer: Self-pay | Admitting: Pharmacist

## 2024-08-21 DIAGNOSIS — Z79899 Other long term (current) drug therapy: Secondary | ICD-10-CM

## 2024-08-21 NOTE — Progress Notes (Signed)
   08/21/2024  Patient ID: Eric [name], male   DOB: 18-Nov-1932, 89 y.o.   MRN: [id number]  Pharmacy Quality Measure Review  This patient is appearing on a report for being at risk of failing the adherence measure for cholesterol (statin) medications this calendar year.   Medication: Pravachol  40 mg Last fill date: 04/03/24 for 90 day supply  Left voicemail for patient to return my call at their convenience. and Will send a delayed note to the Provider as the Patient has an appointment on 08/23/24  [name], PharmD, BCACP Clinical Pharmacist [phone]

## 2024-08-23 ENCOUNTER — Ambulatory Visit (INDEPENDENT_AMBULATORY_CARE_PROVIDER_SITE_OTHER): Admitting: Nurse Practitioner

## 2024-08-23 ENCOUNTER — Encounter: Payer: Self-pay | Admitting: Nurse Practitioner

## 2024-08-23 VITALS — BP 116/68 | HR 75 | Temp 97.4°F | Resp 16 | Ht 66.0 in | Wt 176.4 lb

## 2024-08-23 DIAGNOSIS — R739 Hyperglycemia, unspecified: Secondary | ICD-10-CM | POA: Diagnosis not present

## 2024-08-23 DIAGNOSIS — J449 Chronic obstructive pulmonary disease, unspecified: Secondary | ICD-10-CM

## 2024-08-23 DIAGNOSIS — I480 Paroxysmal atrial fibrillation: Secondary | ICD-10-CM | POA: Diagnosis not present

## 2024-08-23 DIAGNOSIS — L03119 Cellulitis of unspecified part of limb: Secondary | ICD-10-CM

## 2024-08-23 DIAGNOSIS — Z23 Encounter for immunization: Secondary | ICD-10-CM | POA: Diagnosis not present

## 2024-08-23 DIAGNOSIS — E782 Mixed hyperlipidemia: Secondary | ICD-10-CM | POA: Diagnosis not present

## 2024-08-23 DIAGNOSIS — I5032 Chronic diastolic (congestive) heart failure: Secondary | ICD-10-CM

## 2024-08-23 DIAGNOSIS — E039 Hypothyroidism, unspecified: Secondary | ICD-10-CM

## 2024-08-23 MED ORDER — DOXYCYCLINE HYCLATE 100 MG PO TABS: 100.0000 mg | ORAL_TABLET | Freq: Two times a day (BID) | ORAL | 0 refills | Status: AC

## 2024-08-23 MED ORDER — PRAVASTATIN SODIUM 40 MG PO TABS: 40.0000 mg | ORAL_TABLET | Freq: Every day | ORAL | 1 refills | Status: AC

## 2024-08-23 NOTE — Progress Notes (Signed)
 Careteam: Patient Care Team: Caro Harlene POUR, NP as PCP - General (Geriatric Medicine) Perla Evalene PARAS, MD as PCP - Cardiology (Cardiology) Perla Evalene PARAS, MD as Consulting Physician (Cardiology) Cesario Boer, MD as Attending Physician (Physical Medicine and Rehabilitation) Beuford Anes, MD as Consulting Physician (Orthopedic Surgery) Linard Alm NOVAK, MD (Inactive) as Consulting Physician (Pulmonary Disease)  PLACE OF SERVICE:  [facility]  Advanced Directive information    Allergies  Allergen Reactions   Morphine And Codeine Shortness Of Breath   Percocet [Oxycodone-Acetaminophen ] Shortness Of Breath   Valium Shortness Of Breath    Chief Complaint  Patient presents with   Cellulitis    Follow up on Cellulitis.     HPI:  Discussed the use of AI scribe software for clinical note transcription with the patient, who gave verbal consent to proceed.  History of Present Illness Eric [name] is a 89 year old male with a history of cellulitis who presents for follow-up on his leg infection.  He has persistent redness and slight swelling in his leg. He completed a seven-day course of doxycycline  today and has been using a salve. Initially, there was scabbing and oozing, but now improving. No injury noted to leg but area was oozing due to swelling.   He is currently taking pravastatin  for cholesterol management, one pill at night, with a 90-day supply recently obtained at no cost. He also takes Eliquis  5 mg twice daily for atrial fibrillation and gabapentin  for neuropathy. He is on Synthroid  25 mcg, and his thyroid  levels were checked in April.  His medical history includes congestive heart failure, chronic kidney disease, and COPD. Metoprolol  was discontinued due to bradycardia in the past and he has not experienced palpitations or fast heartbeats since. No shortness of breath is reported, but he has a cough at night, attributed to temperature changes and COPD.  No diarrhea  from current medications. He has not had a recent cholesterol panel or A1c test, but blood sugars were checked in August and were normal. He does not have a diagnosis of diabetes, though there have been instances of hyperglycemia during hospitalization    Review of Systems: c5 Review of Systems  Constitutional:  Negative for chills, fever and weight loss.  HENT:  Negative for tinnitus.   Respiratory:  Negative for cough, sputum production and shortness of breath.   Cardiovascular:  Negative for chest pain, palpitations and leg swelling.  Gastrointestinal:  Negative for abdominal pain, constipation, diarrhea and heartburn.  Genitourinary:  Negative for dysuria, frequency and urgency.  Musculoskeletal:  Negative for back pain, falls, joint pain and myalgias.  Skin: Negative.   Neurological:  Negative for dizziness and headaches.  Psychiatric/Behavioral:  Negative for depression and memory loss. The patient does not have insomnia.     Past Medical History:  Diagnosis Date   Anxiety    Arthritis    Atypical chest pain    a. 05/2017 MV: no ischemia, EF 79%.   Chest pain 10/20/2017   Chronic diastolic CHF (congestive heart failure) (HCC)    a. 01/2011 Echo: EF 50-55%, gr1 DD, mild AI, nl RV fxn, mild TR/PR; b. 10/2017 Echo: EF 60-65%, mild LVH, gr2 DD.   DDD (degenerative disc disease), cervical    Depressive disorder, not elsewhere classified    Difficult intubation    Dysphagia, oral phase    Dyspnea    Edema    Gallstones    a. Symptomatic - s/p lap chole 05/2018.   History  of DVT (deep vein thrombosis)    History of kidney stones    Hypertension    Hypoxemia    Impacted cerumen    Ischemic colitis (HCC)    a. 02/2018 GIB - colonoscopy w/ isch colitis. Anticoagulation resumed.   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 a. 01/2011 in setting of post-op complications including aspiration pna;  b. CHA2DS2VASc = 4--> Amio/Eliquis .   Pneumonia 03/06/2003   Spinal stenosis, unspecified region other than cervical    Squamous cell carcinoma of skin of trunk, except scrotum    skin cancer of shoulder   Syncope    a. 10/2017-->Event monitor: RSR, rare PACs/PVCs.   Thoracic or lumbosacral neuritis or radiculitis, unspecified    Past Surgical History:  Procedure Laterality Date   CARDIOVERSION N/A 06/20/2017   Procedure: CARDIOVERSION;  Surgeon: Perla Evalene PARAS, MD;  Location: [facility] ORS;  Service: Cardiovascular;  Laterality: N/A;   CATARACT EXTRACTION W/ INTRAOCULAR LENS  IMPLANT, BILATERAL     CERVICAL FUSION  02/10/2011   CHOLECYSTECTOMY  05/24/2018   CHOLECYSTECTOMY N/A 05/24/2018   Procedure: LAPAROSCOPIC CHOLECYSTECTOMY;  Surgeon: Vernetta Berg, MD;  Location: [facility] OR;  Service: General;  Laterality: N/A;   COLONOSCOPY WITH PROPOFOL  N/A 02/20/2018   Procedure: COLONOSCOPY WITH PROPOFOL ;  Surgeon: Jinny Carmine, MD;  Location: [facility] ENDOSCOPY;  Service: Endoscopy;  Laterality: N/A;   history of abd ultrasound  11/01   fatty liver   MULTIPLE TOOTH EXTRACTIONS     ORIF FIBULA FRACTURE Left 01/06/2017   Procedure: OPEN REDUCTION INTERNAL FIXATION (ORIF) FIBULA FRACTURE DISTAL FIBULA;  Surgeon: Maude Herald, MD;  Location: [facility] OR;  Service: Orthopedics;  Laterality: Left;  Patient states has problems if he will have a tube in throat for Genera; Anesthesia   Social History:   reports that he has quit smoking. His smoking use included cigarettes. He has never used smokeless tobacco. He reports that he does not drink alcohol and does not use drugs.  Family History  Problem Relation Age of Onset   Stroke Father    Atrial fibrillation Brother        on coumadin   Heart disease Brother         AFib- coumadin   Stroke Paternal Grandfather    Other Mother        hemorrhage   Cancer Other        colon cancer at early age   Prostate cancer Neg Hx    Kidney cancer Neg Hx    Bladder Cancer Neg Hx     Medications: Patient's Medications  New Prescriptions   No medications on file  Previous Medications   ALBUTEROL  (PROVENTIL ) (2.5 MG/3ML) 0.083% NEBULIZER SOLUTION    TAKE 3MLS(1 VIAL) VIA NEBULIZER EVERY 6 HOURS AS NEEDED   CETIRIZINE  (ZYRTEC ) 10 MG TABLET    TAKE 1 TABLET(10 MG) BY MOUTH DAILY   CHOLECALCIFEROL  (VITAMIN D3) 1000 UNITS CAPS    Take 1,000 Units by mouth daily. Take one capsule (1000units) by mouth day.   DOXYCYCLINE  (VIBRA -TABS) 100 MG TABLET    Take 1 tablet (100 mg total) by mouth 2 (two)  times daily for 7 days.   ELIQUIS  5 MG TABS TABLET    TAKE 1 TABLET BY MOUTH TWICE DAILY   EMPAGLIFLOZIN  (JARDIANCE ) 10 MG TABS TABLET    Take 1 tablet (10 mg total) by mouth daily.   FLUTICASONE -UMECLIDIN-VILANT (TRELEGY ELLIPTA ) 100-62.5-25 MCG/ACT AEPB    Inhale 1 puff into the lungs daily.   FUROSEMIDE  (LASIX ) 40 MG TABLET    Take 1 tablet (40 mg total) by mouth daily.   GABAPENTIN  (NEURONTIN ) 300 MG CAPSULE    TAKE 1 CAPSULE(300 MG) BY MOUTH TWICE DAILY   IBUPROFEN  (ADVIL ) 200 MG TABLET    Take 200 mg by mouth as needed.   LEVOTHYROXINE  (SYNTHROID ) 25 MCG TABLET    TAKE 1 TABLET BY MOUTH EVERY DAY BEFORE BREAKFAST AS DIRECTED   MUPIROCIN  OINTMENT (BACTROBAN ) 2 %    Apply 1 Application topically daily for 7 days.   OXYGEN     Inhale 4 L/min into the lungs at bedtime.   PRAVASTATIN  (PRAVACHOL ) 40 MG TABLET    TAKE 1 TABLET BY MOUTH EVERY DAY   SPIRONOLACTONE  (ALDACTONE ) 25 MG TABLET    Take 0.5 tablets (12.5 mg total) by mouth daily.  Modified Medications   No medications on file  Discontinued Medications   No medications on file    Physical Exam:  Vitals:   08/23/24 1345  BP: 116/68  Pulse: 75  Resp: 16  Temp: (!) 97.4 F (36.3 C)  SpO2: 96%  Weight: 176 lb 6.4  oz (80 kg)  Height: 5' 6 (1.676 m)   Body mass index is 28.47 kg/m. Wt Readings from Last 3 Encounters:  07/25/24 174 lb 6.4 oz (79.1 kg)  07/12/24 173 lb (78.5 kg)  06/19/24 173 lb (78.5 kg)    Physical Exam Constitutional:      General: He is not in acute distress.    Appearance: He is well-developed. He is not diaphoretic.  HENT:     Head: Normocephalic and atraumatic.     Right Ear: External ear normal.     Left Ear: External ear normal.     Mouth/Throat:     Pharynx: No oropharyngeal exudate.  Eyes:     Conjunctiva/sclera: Conjunctivae normal.     Pupils: Pupils are equal, round, and reactive to light.  Cardiovascular:     Rate and Rhythm: Normal rate and regular rhythm.     Heart sounds: Normal heart sounds.  Pulmonary:     Effort: Pulmonary effort is normal.     Breath sounds: Normal breath sounds.  Abdominal:     General: Bowel sounds are normal.     Palpations: Abdomen is soft.  Musculoskeletal:        General: No tenderness.     Cervical back: Normal range of motion and neck supple.     Right lower leg: No edema.     Left lower leg: No edema.  Skin:    General: Skin is warm and dry.     Comments: Color changes to bilateral LE Right leg continues to be slightly more red than left.  No pain noted Mild edema noted   Neurological:     Mental Status: He is alert and oriented to person, place, and time.     Labs reviewed: Basic Metabolic Panel: Recent Labs    09/15/23 0626 09/16/23 0537 09/17/23 0650 09/18/23 1004 09/19/23 0927 09/20/23 0540 10/06/23 1030 04/09/24 0508 04/10/24 0227 06/20/24 0700 06/21/24 0613 07/12/24 1247  NA 137   < > 140  136   < > 138   < > 140   < > 136 139 142  K 4.1   < > 3.9 4.0   < > 3.8   < > 3.8   < > 4.4 4.2 4.0  CL 102   < > 98 95*   < > 96*   < > 102   < > 102 103 103  CO2 24   < > 31 28   < > 32   < > 30   < > 23 27 26   GLUCOSE 164*   < > 117* 156*   < > 120*   < > 98   < > 107* 93 83  BUN 28*   < > 33* 34*   <  > 37*   < > 24*   < > 27* 26* 25*  CREATININE 1.25*   < > 1.36* 1.36*   < > 1.03   < > 1.24   < > 1.37* 1.49* 1.45*  CALCIUM 8.5*   < > 8.5* 8.7*   < > 8.9   < > 8.9   < > 9.0 8.8* 9.5  MG 2.0   < > 2.2 2.3  --  2.4  --   --   --   --   --   --   PHOS 2.4*  --   --   --   --   --   --   --   --   --   --   --   TSH  --   --   --   --   --   --   --  2.539  --   --   --   --    < > = values in this interval not displayed.   Liver Function Tests: Recent Labs    09/14/23 1707 04/09/24 0508 06/19/24 2205  AST 40 41 42*  ALT 40 34 40  ALKPHOS 47 65 61  BILITOT 1.7* 2.3* 1.7*  PROT 6.9 6.9 8.0  ALBUMIN 3.7 3.6 4.2   No results for input(s): LIPASE, AMYLASE in the last 8760 hours. No results for input(s): AMMONIA in the last 8760 hours. CBC: Recent Labs    04/09/24 0508 06/19/24 2205 06/20/24 0700 06/21/24 0613 07/12/24 1247  WBC 5.8 13.1* 15.3* 9.1 6.8  NEUTROABS 3.5 10.1*  --   --  3.8  HGB 13.8 16.0 14.6 13.5 14.0  HCT 44.4 49.7 44.6 42.7 43.7  MCV 95.7 93.8 93.3 93.8 93.8  PLT 152 222 201 173 199   Lipid Panel: No results for input(s): CHOL, HDL, LDLCALC, TRIG, CHOLHDL, LDLDIRECT in the last 8760 hours. TSH: Recent Labs    04/09/24 0508  TSH 2.539   A1C: Lab Results  Component Value Date   HGBA1C 5.7 (H) 05/16/2016     Assessment/Plan  Chronic diastolic CHF (congestive heart failure) (HCC) Assessment & Plan: Euvolemic at this time, followed by cardiology Continues on lasix  and aldactone .    Recurrent cellulitis of lower extremity Assessment & Plan: Will extend doxycycline  for total of 10 days Has vascular consult pending due to recurrent cellulitis.   Orders: -     Doxycycline  Hyclate; Take 1 tablet (100 mg total) by mouth 2 (two) times daily for 3 days.  Dispense: 6 tablet; Refill: 0  Mixed hyperlipidemia Assessment & Plan: Continue statin therapy  Reports he is taking daily as directed Up to date on reills.  Orders: -      Pravastatin  Sodium; Take 1 tablet (40 mg total) by mouth daily.  Dispense: 90 tablet; Refill: 1 -     Lipid panel  Hyperglycemia Assessment & Plan: Dietary modifications advised, will follow up a1c  Orders: -     Hemoglobin A1c  PAF (paroxysmal atrial fibrillation) (HCC) Assessment & Plan: Continue anticoagulation with apixaban . No abnormal bleeding noted.  Patient not on AV blocking agents due to bradycardia.    Chronic obstructive pulmonary disease, unspecified COPD type (HCC) Assessment & Plan: No recent exacerbations Continues on albuterol  PRN   Hypothyroidism, unspecified type Assessment & Plan: Continue with levothyroxine  25 mcg. Tsh at goal in April    Need for influenza vaccination -     Flu vaccine HIGH DOSE PF(Fluzone Trivalent)     Return in about 4 months (around 12/23/2024) for routine follow up.  Marcos Peloso K. Caro BODILY [facility] [phone]

## 2024-08-23 NOTE — Patient Instructions (Signed)
 Continue doxycyline for 3 more days- Rx sent to pharmacy

## 2024-08-24 LAB — HEMOGLOBIN A1C
Hgb A1c MFr Bld: 5.4 % (ref <5.7)
Mean Plasma Glucose: 108 mg/dL
eAG (mmol/L): 6 mmol/L

## 2024-08-24 LAB — LIPID PANEL
Cholesterol: 113 mg/dL (ref <200)
HDL: 30 mg/dL — ABNORMAL LOW (ref >=40)
LDL Cholesterol (Calc): 57 mg/dL
Non-HDL Cholesterol (Calc): 83 mg/dL (ref <130)
Total CHOL/HDL Ratio: 3.8 (calc) (ref <5.0)
Triglycerides: 189 mg/dL — ABNORMAL HIGH (ref <150)

## 2024-08-26 ENCOUNTER — Ambulatory Visit: Payer: Self-pay | Admitting: Nurse Practitioner

## 2024-08-28 DIAGNOSIS — R739 Hyperglycemia, unspecified: Secondary | ICD-10-CM | POA: Insufficient documentation

## 2024-08-28 NOTE — Assessment & Plan Note (Signed)
 Continue with levothyroxine  25 mcg. Tsh at goal in April

## 2024-08-28 NOTE — Assessment & Plan Note (Signed)
 Dietary modifications advised, will follow up a1c

## 2024-08-28 NOTE — Assessment & Plan Note (Signed)
 Continue statin therapy  Reports he is taking daily as directed Up to date on reills.

## 2024-08-28 NOTE — Assessment & Plan Note (Signed)
 No recent exacerbations Continues on albuterol  PRN

## 2024-08-28 NOTE — Assessment & Plan Note (Addendum)
 Euvolemic at this time, followed by cardiology Continues on lasix  and aldactone .

## 2024-08-28 NOTE — Assessment & Plan Note (Signed)
 Will extend doxycycline  for total of 10 days Has vascular consult pending due to recurrent cellulitis.

## 2024-08-28 NOTE — Assessment & Plan Note (Signed)
 Continue anticoagulation with apixaban . No abnormal bleeding noted.  Patient not on AV blocking agents due to bradycardia.

## 2024-09-04 DIAGNOSIS — D2261 Melanocytic nevi of right upper limb, including shoulder: Secondary | ICD-10-CM | POA: Diagnosis not present

## 2024-09-04 DIAGNOSIS — L821 Other seborrheic keratosis: Secondary | ICD-10-CM | POA: Diagnosis not present

## 2024-09-04 DIAGNOSIS — D2262 Melanocytic nevi of left upper limb, including shoulder: Secondary | ICD-10-CM | POA: Diagnosis not present

## 2024-09-04 DIAGNOSIS — L570 Actinic keratosis: Secondary | ICD-10-CM | POA: Diagnosis not present

## 2024-09-04 DIAGNOSIS — D225 Melanocytic nevi of trunk: Secondary | ICD-10-CM | POA: Diagnosis not present

## 2024-09-04 DIAGNOSIS — J4489 Other specified chronic obstructive pulmonary disease: Secondary | ICD-10-CM | POA: Diagnosis not present

## 2024-09-06 ENCOUNTER — Ambulatory Visit: Admitting: Nurse Practitioner

## 2024-10-04 DIAGNOSIS — J4489 Other specified chronic obstructive pulmonary disease: Secondary | ICD-10-CM | POA: Diagnosis not present

## 2024-10-10 ENCOUNTER — Other Ambulatory Visit: Payer: Self-pay | Admitting: Nurse Practitioner

## 2024-10-10 DIAGNOSIS — E039 Hypothyroidism, unspecified: Secondary | ICD-10-CM

## 2024-10-15 ENCOUNTER — Encounter: Payer: Self-pay | Admitting: Pulmonary Disease

## 2024-10-15 ENCOUNTER — Ambulatory Visit: Admitting: Pulmonary Disease

## 2024-10-15 VITALS — BP 120/78 | HR 65 | Temp 98.1°F | Ht 66.0 in | Wt 174.2 lb

## 2024-10-15 DIAGNOSIS — R1310 Dysphagia, unspecified: Secondary | ICD-10-CM | POA: Diagnosis not present

## 2024-10-15 DIAGNOSIS — J9610 Chronic respiratory failure, unspecified whether with hypoxia or hypercapnia: Secondary | ICD-10-CM | POA: Diagnosis not present

## 2024-10-15 DIAGNOSIS — J4489 Other specified chronic obstructive pulmonary disease: Secondary | ICD-10-CM

## 2024-10-15 DIAGNOSIS — Z9981 Dependence on supplemental oxygen: Secondary | ICD-10-CM | POA: Diagnosis not present

## 2024-10-15 MED ORDER — IPRATROPIUM-ALBUTEROL 0.5-2.5 (3) MG/3ML IN SOLN: 3.0000 mL | Freq: Four times a day (QID) | RESPIRATORY_TRACT | 4 refills | Status: AC | PRN

## 2024-10-15 NOTE — Progress Notes (Signed)
 Synopsis: Referred in by Caro Harlene POUR, NP   Subjective:   PATIENT ID: Eric [name] GENDER: male DOB: 10-06-1932, MRN: [id number]  Chief Complaint  Patient presents with   COPD    DOE. No wheezing. Cough, dry. Trelegy- daily helps with his breathing.     HPI Eric [name] is a pleasant 89 year old male patient with a past medical history of COPD with chronic bronchitis presenting today to the pulmonary clinic to establish care.   He is a previous patient of Dr. Shellia and was being managed for COPD was maintained on Advair. He was last seen in Sept 2024.   He was admitted to [facility] in October 2024 for a COPD exacerbation.   Current symptoms include dyspnea on exertion, chronic cough with sputum production.   PFTs 12/2018 - Moderate Obstructive defect with significant response to bronchodilators. Moderate restrictive lund disease.   CT chest wo contrast 09/2023 - Without any clear evidence of ILD. Some infiltrates at the right base.   DG swallow eval 09/2023 - No clear airway invasion. Esophageal sweep showed diffuse residue and changes to esophageal tract due to curvature of spine. Suspect cough Palazzi be related to esophageal stasis than any aspiration.     Family history:His family history includes Atrial fibrillation in his brother; Cancer in an other family member; Heart disease in his brother; Other in his mother; Stroke in his father and paternal grandfather.   Social history:  He  reports that he has quit smoking. His smoking use included cigarettes. He has never used smokeless tobacco. He reports that he does not drink alcohol and does not use drugs.   OV 10/15/2024 - Eric [name] is here to follow up on his COPD. He is on Trelegy 100 1 puff daily. He is still complaining of intermittent cough and dyspnea. I advised him to add duonebs to his regimen twice a day which he is agreeable with.   ROS All systems were reviewed and are negative except for the above.  Objective:   Vitals:    10/15/24 0943  BP: 120/78  Pulse: 65  Temp: 98.1 F (36.7 C)  SpO2: 95%  Weight: 174 lb 3.2 oz (79 kg)  Height: 5' 6 (1.676 m)   95% on RA BMI Readings from Last 3 Encounters:  10/15/24 28.12 kg/m  08/23/24 28.47 kg/m  07/25/24 28.15 kg/m   Wt Readings from Last 3 Encounters:  10/15/24 174 lb 3.2 oz (79 kg)  08/23/24 176 lb 6.4 oz (80 kg)  07/25/24 174 lb 6.4 oz (79.1 kg)    Physical Exam GEN: NAD, Healthy Appearing HEENT: Supple Neck, Reactive Pupils, EOMI  CVS: Normal S1, Normal S2, RRR, No murmurs or ES appreciated  Lungs: Poor air movement with bibasilar crackles.  Abdomen: Soft, non tender, non distended, + BS  Extremities: Warm and well perfused, No edema  Skin: No suspicious lesions appreciated  Psych: Normal Affect  Ancillary Information   CBC    Component Value Date/Time   WBC 6.8 07/12/2024 1247   RBC 4.66 07/12/2024 1247   HGB 14.0 07/12/2024 1247   HGB 14.5 03/29/2017 1503   HCT 43.7 07/12/2024 1247   HCT 43.4 03/29/2017 1503   PLT 199 07/12/2024 1247   PLT 210 03/29/2017 1503   MCV 93.8 07/12/2024 1247   MCV 97 03/29/2017 1503   MCH 30.0 07/12/2024 1247   MCHC 32.0 07/12/2024 1247   RDW 15.8 (H) 07/12/2024 1247   RDW 13.7 03/29/2017 1503  LYMPHSABS 2.1 07/12/2024 1247   LYMPHSABS 2.4 05/16/2016 1035   MONOABS 0.7 07/12/2024 1247   EOSABS 0.1 07/12/2024 1247   EOSABS 0.1 05/16/2016 1035   BASOSABS 0.1 07/12/2024 1247   BASOSABS 0.0 05/16/2016 1035        No data to display           Assessment & Plan:  [name] is a pleasant 89 year old male patient with a past medical history of COPD with chronic bronchitis presenting today to the pulmonary clinic to establish care.  #COPD with Stage II COPD Gp B CAR > 10 mmrc > 2  #CHRF on 2 to 4L [place] at night   []  Start fluticasone -Umeclidinum-Vilanterol [Trelegy] 100 1 puff daily.  []  C/w Albuterol  as needed.  []  Encourage Flutter Valve use.  []  Add duonebs to his regimen twice a day.    #Dysphagia Appears to be in the setting of Esophageal dysmotility. No signs of aspiration.   RTC 6 months   I personally spent a total of 30 minutes in the care of the patient today including preparing to see the patient, getting/reviewing separately obtained history, performing a medically appropriate exam/evaluation, counseling and educating, placing orders, documenting clinical information in the EHR, independently interpreting results, and communicating results.   Darrin Barn, MD [facility] 10/15/2024 9:56 AM

## 2024-10-22 ENCOUNTER — Encounter: Payer: Self-pay | Admitting: Podiatry

## 2024-10-22 ENCOUNTER — Ambulatory Visit: Admitting: Podiatry

## 2024-10-22 DIAGNOSIS — D689 Coagulation defect, unspecified: Secondary | ICD-10-CM

## 2024-10-22 DIAGNOSIS — E113393 Type 2 diabetes mellitus with moderate nonproliferative diabetic retinopathy without macular edema, bilateral: Secondary | ICD-10-CM | POA: Diagnosis not present

## 2024-10-22 DIAGNOSIS — B351 Tinea unguium: Secondary | ICD-10-CM | POA: Diagnosis not present

## 2024-10-22 DIAGNOSIS — M79674 Pain in right toe(s): Secondary | ICD-10-CM | POA: Diagnosis not present

## 2024-10-22 DIAGNOSIS — M79675 Pain in left toe(s): Secondary | ICD-10-CM | POA: Diagnosis not present

## 2024-10-22 NOTE — Progress Notes (Signed)
 This patient returns to my office for at risk foot care.  This patient requires this care by a professional since this patient will be at risk due to having coagulation defect.  This patient is unable to cut nails himself since the patient cannot reach his nails.These nails are painful walking and wearing shoes.  This patient has not been seen for over 11 months. This patient presents for at risk foot care today.  General Appearance  Alert, conversant and in no acute stress.  Vascular  Dorsalis pedis and posterior tibial  pulses are  weakly palpable  bilaterally.  Capillary return is within normal limits  bilaterally. Temperature is within normal limits  bilaterally.  Neurologic  Senn-Weinstein monofilament wire test within normal limits  bilaterally. Muscle power within normal limits bilaterally.  Nails Thick disfigured discolored nails with subungual debris  from hallux to fifth toes bilaterally. No evidence of bacterial infection or drainage bilaterally.  Orthopedic  No limitations of motion  feet .  No crepitus or effusions noted.  Hammer toes  B/L.SABRA  Skin  normotropic skin with no porokeratosis noted bilaterally.  No signs of infections or ulcers noted.  Distal clavi second toe left foot.   Onychomycosis  Pain in right toes  Pain in left toes  Consent was obtained for treatment procedures.   Mechanical debridement of nails 1-5  bilaterally performed with a nail nipper.  Filed with dremel without incident.    Return office visit   3 months                   Told patient to return for periodic foot care and evaluation due to potential at risk complications.   Cordella Bold DPM

## 2024-11-11 ENCOUNTER — Other Ambulatory Visit: Payer: Self-pay | Admitting: Nurse Practitioner

## 2024-11-22 ENCOUNTER — Other Ambulatory Visit: Payer: Self-pay

## 2024-11-22 DIAGNOSIS — L03119 Cellulitis of unspecified part of limb: Secondary | ICD-10-CM

## 2024-12-23 NOTE — Progress Notes (Unsigned)
 "   Requested by:  Eric Greig BRAVO, NP [address] [place],  [place] [address]  Reason for consultation: Recurrent cellulitis of RLE    History of Present Illness   Eric [name] is a 89 y.o. (October 10, 1932) male who presents for evaluation of recurrent right lower extremity cellulitis. He is here with his son. He explains that he has had swelling in both his legs for several years. Also over past couple years his right leg just gives out at times. He has had several falls recently. He does not report any pain in his legs but they do get tired easily. 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 He does have a history of right lower extremity DVT back in the last 60's. He does not recall any cause of the DVT but says he was hospitalized and on bedrest for several days until the  clot dissolved. No other history of DVT since. He does elevate his legs occasionally in his lift chair. He just recently started wearing knee high compression stockings that he received from [employer] over the [place]. He says he has not noticed any improvement using them. He does take fluid pills as well which he reports help some with the swelling.   Venous symptoms include: tired, swelling, recurrent cellulitis Onset/duration:  couple years  Occupation:  retired, herbalist Aggravating factors: sitting, standing Alleviating factors: elevation Compression:  yes Helps:  unsure Pain medications:  no Previous vein procedures:  no History of DVT:  Yes   Past Medical History:  Diagnosis Date   Anxiety    Arthritis    Atypical chest pain    a. 05/2017 MV: no ischemia, EF 79%.   Chest pain 10/20/2017   Chronic diastolic CHF (congestive heart failure) (HCC)    a. 01/2011 Echo: EF 50-55%, gr1 DD, mild AI,  nl RV fxn, mild TR/PR; b. 10/2017 Echo: EF 60-65%, mild LVH, gr2 DD.   DDD (degenerative disc disease), cervical    Depressive disorder, not elsewhere classified    Difficult intubation    Dysphagia, oral phase    Dyspnea    Edema    Gallstones    a. Symptomatic - s/p lap chole 05/2018.   History of DVT (deep vein thrombosis)    History of kidney stones    Hypertension    Hypoxemia    Impacted cerumen    Ischemic colitis    a. 02/2018 GIB - colonoscopy w/ isch colitis. Anticoagulation resumed.   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 a. 01/2011 in setting of post-op complications including aspiration pna;  b. CHA2DS2VASc = 4--> Amio/Eliquis .   Pneumonia 03/06/2003   Spinal stenosis, unspecified region other than cervical    Squamous cell carcinoma of skin of trunk, except scrotum    skin cancer of shoulder   Syncope    a. 10/2017-->Event monitor: RSR, rare PACs/PVCs.  Thoracic or lumbosacral neuritis or radiculitis, unspecified     Past Surgical History:  Procedure Laterality Date   CARDIOVERSION N/A 06/20/2017   Procedure: CARDIOVERSION;  Surgeon: Perla Evalene PARAS, MD;  Location: [facility] ORS;  Service: Cardiovascular;  Laterality: N/A;   CATARACT EXTRACTION W/ INTRAOCULAR LENS  IMPLANT, BILATERAL     CERVICAL FUSION  02/10/2011   CHOLECYSTECTOMY  05/24/2018   CHOLECYSTECTOMY N/A 05/24/2018   Procedure: LAPAROSCOPIC CHOLECYSTECTOMY;  Surgeon: Vernetta Berg, MD;  Location: [facility] OR;  Service: General;  Laterality: N/A;   COLONOSCOPY WITH PROPOFOL  N/A 02/20/2018   Procedure: COLONOSCOPY WITH PROPOFOL ;  Surgeon: Jinny Carmine, MD;  Location: [facility] ENDOSCOPY;  Service: Endoscopy;  Laterality: N/A;   history  of abd ultrasound  11/01   fatty liver   MULTIPLE TOOTH EXTRACTIONS     ORIF FIBULA FRACTURE Left 01/06/2017   Procedure: OPEN REDUCTION INTERNAL FIXATION (ORIF) FIBULA FRACTURE DISTAL FIBULA;  Surgeon: Maude Herald, MD;  Location: [facility] OR;  Service: Orthopedics;  Laterality: Left;  Patient states has problems if he will have a tube in throat for Genera; Anesthesia    Social History   Socioeconomic History   Marital status: Married    Spouse name: Not on file   Number of children: 2   Years of education: 12   Highest education level: High school graduate  Occupational History   Occupation: Retired 2006    Employer: RETIRED    Comment: Teacher, [employer] as a location manager  Tobacco Use   Smoking status: Former    Types: Cigarettes   Smokeless tobacco: Never   Tobacco comments:    stopped in 20's  Vaping Use   Vaping status: Never Used  Substance and Sexual Activity   Alcohol use: No   Drug use: No   Sexual activity: Not Currently  Other Topics Concern   Not on file  Social History Narrative   Lives at home with his wife.   Left-handed (due to arthritis, he uses his right hand more now).   Caffeine use: 2 cups per day.   Social Drivers of Health   Tobacco Use: Medium Risk (10/22/2024)   Patient History    Smoking Tobacco Use: Former    Smokeless Tobacco Use: Never    Passive Exposure: Not on Actuary Strain: Not on file  Food Insecurity: No Food Insecurity (06/20/2024)   Epic    Worried About Programme Researcher, Broadcasting/film/video in the Last Year: Never true    Ran Out of Food in the Last Year: Never true  Transportation Needs: No Transportation Needs (06/20/2024)   Epic    Lack of Transportation (Medical): No    Lack of Transportation (Non-Medical): No  Physical Activity: Not on file  Stress: Not on file  Social Connections: Socially Integrated (06/20/2024)   Social Connection and Isolation Panel    Frequency of Communication with Friends and Family: More than  three times a week    Frequency of Social Gatherings with Friends and Family: More than three times a week    Attends Religious Services: More than 4 times per year    Active Member of [employer] or Organizations: Yes    Attends Banker Meetings: More than 4 times per year    Marital Status: Married  Catering Manager Violence: Not At Risk (06/20/2024)   Epic    Fear of Current or Ex-Partner: No    Emotionally Abused: No    Physically Abused: No    Sexually  Abused: No  Depression (PHQ2-9): Low Risk (07/16/2024)   Depression (PHQ2-9)    PHQ-2 Score: 0  Alcohol Screen: Not on file  Housing: Low Risk (06/20/2024)   Epic    Unable to Pay for Housing in the Last Year: No    Number of Times Moved in the Last Year: 0    Homeless in the Last Year: No  Utilities: Not At Risk (06/20/2024)   Epic    Threatened with loss of utilities: No  Health Literacy: Not on file    Family History  Problem Relation Age of Onset   Stroke Father    Atrial fibrillation Brother        on coumadin   Heart disease Brother        AFib- coumadin   Stroke Paternal Grandfather    Other Mother        hemorrhage   Cancer Other        colon cancer at early age   Prostate cancer Neg Hx    Kidney cancer Neg Hx    Bladder Cancer Neg Hx     Current Outpatient Medications  Medication Sig Dispense Refill   albuterol  (PROVENTIL ) (2.5 MG/3ML) 0.083% nebulizer solution TAKE 3MLS(1 VIAL) VIA NEBULIZER EVERY 6 HOURS AS NEEDED 75 mL 4   cetirizine  (ZYRTEC ) 10 MG tablet TAKE 1 TABLET(10 MG) BY MOUTH DAILY 90 tablet 3   Cholecalciferol  (VITAMIN D3) 1000 units CAPS Take 1,000 Units by mouth daily. Take one capsule (1000units) by mouth day.     ELIQUIS  5 MG TABS tablet TAKE 1 TABLET BY MOUTH TWICE DAILY 180 tablet 3   Fluticasone -Umeclidin-Vilant (TRELEGY ELLIPTA ) 100-62.5-25 MCG/ACT AEPB Inhale 1 puff into the lungs daily. 3 each 3   furosemide  (LASIX ) 40 MG tablet Take 1 tablet (40 mg total) by mouth daily. 30  tablet 0   gabapentin  (NEURONTIN ) 300 MG capsule TAKE 1 CAPSULE(300 MG) BY MOUTH TWICE DAILY 180 capsule 1   ibuprofen  (ADVIL ) 200 MG tablet Take 200 mg by mouth as needed.     ipratropium-albuterol  (DUONEB) 0.5-2.5 (3) MG/3ML SOLN Take 3 mLs by nebulization every 6 (six) hours as needed. 360 mL 4   JARDIANCE  10 MG TABS tablet TAKE 1 TABLET(10 MG) BY MOUTH DAILY 90 tablet 1   levothyroxine  (SYNTHROID ) 25 MCG tablet TAKE 1 TABLET BY MOUTH EVERY DAY BEFORE BREAKFAST AS DIRECTED 90 tablet 1   OXYGEN  Inhale 4 L/min into the lungs at bedtime.     pravastatin  (PRAVACHOL ) 40 MG tablet Take 1 tablet (40 mg total) by mouth daily. 90 tablet 1   spironolactone  (ALDACTONE ) 25 MG tablet TAKE 1/2 TABLET(12.5 MG) BY MOUTH DAILY 45 tablet 1   No current facility-administered medications for this visit.    Allergies[1]  REVIEW OF SYSTEMS (negative unless checked):   Cardiac:  []  Chest pain or chest pressure? []  Shortness of breath upon activity? []  Shortness of breath when lying flat? []  Irregular heart rhythm?  Vascular:  []  Pain in calf, thigh, or hip brought on by walking? []  Pain in feet at night that wakes you up from your sleep? []  Blood clot in your veins? [x]  Leg swelling?  Pulmonary:  []  Oxygen  at home? []  Productive cough? []  Wheezing?  Neurologic:  []  Sudden weakness in arms or legs? []  Sudden numbness in arms or legs? []  Sudden onset of difficult speaking or slurred speech? []  Temporary loss of vision in one eye? []  Problems with dizziness?  Gastrointestinal:  []  Blood in stool? []   Vomited blood?  Genitourinary:  []  Burning when urinating? []  Blood in urine?  Psychiatric:  []  Major depression  Hematologic:  []  Bleeding problems? []  Problems with blood clotting?  Dermatologic:  []  Rashes or ulcers?  Constitutional:  []  Fever or chills?  Ear/Nose/Throat:  []  Change in hearing? []  Nose bleeds? []  Sore throat?  Musculoskeletal:  []  Back pain? []  Joint  pain? []  Muscle pain?   Physical Examination     Vitals:   12/25/24 1224  BP: 113/63  Pulse: 63  Temp: 97.7 F (36.5 C)  TempSrc: Temporal  Weight: 180 lb 4.8 oz (81.8 kg)   Body mass index is 29.1 kg/m.  General:  WDWN in NAD; vital signs documented above Gait: uses rollator HENT: WNL, normocephalic Pulmonary: normal non-labored breathing Cardiac: regular HR Abdomen: soft Vascular Exam/Pulses: Doppler DP/PT signals bilaterally Extremities: without varicose veins, with reticular veins, with edema, with stasis pigmentation, without lipodermatosclerosis, without ulcers several skin tears on LLE that appear to be healing, scabs present Musculoskeletal: no muscle wasting or atrophy  Neurologic: A&O X 3;  No focal weakness or paresthesias are detected Psychiatric:  The pt has Normal affect.  Non-invasive Vascular Imaging   BLE Venous Insufficiency Duplex (12/25/24):  RLE:  No DVT and SVT GSV reflux SFJ through mid thigh GSV diameter 0. 25-0.45 cm SSV reflux at SPJ CFV, popliteal deep venous reflux  Medical Decision Making   Eryk Beavers Mayers is a 89 y.o. male who presents with: RLE chronic venous insufficiency with recurrent cellulitis. Has many year history of swelling in both legs. His biggest concern is frequent falls from his right leg just giving out. I do not think this issue is from his arterial or venous disease. Suspect it is more musculoskeletal. Recommend he follow up with PCP regarding this. His duplex today shows no DVT or SVT. He does have both deep and superficial venous reflux. We did not evaluate his left leg today but suspect he would have similar findings just based on his physical exam. Based on his duplex today he is not a candidate for any invasive vascular procedure like a vein ablation. He is currently not having a bout of cellulitis. Discussed with patient and his son that if they can better control his swelling with elevation and compression and keep his skin  hydrated to reduce cracking it should reduce the recurrent episodes.  Based on the patient's history and examination, I recommend: daily elevation of 20-30 minutes above level of heart, daily compression stocking use, exercise, weight reduction, refraining from prolonged sitting or standing. I discussed with the patient the use of his 15-20 mm knee high compression stockings. He was measured and fitted for a pair at today's visit. Patient and son were provided with information about vein health He can follow up as needed if he has any new or worsening symptoms  Teretha Damme, PA-C [facility] Office: [phone]  12/25/2024, 12:30 PM  Clinic MD: Sheree     [1]  Allergies Allergen Reactions   Morphine And Codeine Shortness Of Breath   Percocet [Oxycodone-Acetaminophen ] Shortness Of Breath   Valium Shortness Of Breath   "

## 2024-12-25 ENCOUNTER — Ambulatory Visit (HOSPITAL_COMMUNITY)
Admission: RE | Admit: 2024-12-25 | Discharge: 2024-12-25 | Disposition: A | Discharge status: Home / Self Care | Source: Ambulatory Visit | Attending: Vascular Surgery | Admitting: Vascular Surgery

## 2024-12-25 ENCOUNTER — Ambulatory Visit (INDEPENDENT_AMBULATORY_CARE_PROVIDER_SITE_OTHER): Admitting: Physician Assistant

## 2024-12-25 ENCOUNTER — Encounter: Payer: Self-pay | Admitting: Physician Assistant

## 2024-12-25 VITALS — BP 113/63 | HR 63 | Temp 97.7°F | Wt 180.3 lb

## 2024-12-25 DIAGNOSIS — L03115 Cellulitis of right lower limb: Secondary | ICD-10-CM | POA: Diagnosis not present

## 2024-12-25 DIAGNOSIS — I872 Venous insufficiency (chronic) (peripheral): Secondary | ICD-10-CM

## 2024-12-25 DIAGNOSIS — L03119 Cellulitis of unspecified part of limb: Secondary | ICD-10-CM | POA: Insufficient documentation

## 2024-12-25 DIAGNOSIS — M7989 Other specified soft tissue disorders: Secondary | ICD-10-CM

## 2024-12-27 ENCOUNTER — Ambulatory Visit: Payer: Self-pay | Admitting: Nurse Practitioner

## 2025-01-10 ENCOUNTER — Telehealth: Payer: Self-pay | Admitting: Nurse Practitioner

## 2025-01-10 ENCOUNTER — Ambulatory Visit: Admitting: Nurse Practitioner

## 2025-01-10 NOTE — Telephone Encounter (Addendum)
 [facility] Medical Records Request received and forward to Medical Records. If anyone calls to follow up they need to do so with Medical Records

## 2025-01-22 ENCOUNTER — Ambulatory Visit: Admitting: Podiatry

## 2025-07-17 ENCOUNTER — Ambulatory Visit: Payer: Self-pay | Admitting: Nurse Practitioner
